# Patient Record
Sex: Female | Born: 1960
Health system: Southern US, Community
[De-identification: ages and names within clinical notes are randomized; demographics above are authoritative.]

## PROBLEM LIST (undated history)

## (undated) DIAGNOSIS — T7840XA Allergy, unspecified, initial encounter: Secondary | ICD-10-CM

## (undated) DIAGNOSIS — M810 Age-related osteoporosis without current pathological fracture: Secondary | ICD-10-CM

## (undated) DIAGNOSIS — Z87891 Personal history of nicotine dependence: Secondary | ICD-10-CM

## (undated) DIAGNOSIS — F1910 Other psychoactive substance abuse, uncomplicated: Secondary | ICD-10-CM

## (undated) DIAGNOSIS — H60399 Other infective otitis externa, unspecified ear: Secondary | ICD-10-CM

## (undated) DIAGNOSIS — J449 Chronic obstructive pulmonary disease, unspecified: Secondary | ICD-10-CM

## (undated) DIAGNOSIS — J302 Other seasonal allergic rhinitis: Secondary | ICD-10-CM

## (undated) DIAGNOSIS — I639 Cerebral infarction, unspecified: Secondary | ICD-10-CM

## (undated) DIAGNOSIS — Z21 Asymptomatic human immunodeficiency virus [HIV] infection status: Secondary | ICD-10-CM

## (undated) DIAGNOSIS — M1990 Unspecified osteoarthritis, unspecified site: Secondary | ICD-10-CM

## (undated) DIAGNOSIS — H269 Unspecified cataract: Secondary | ICD-10-CM

## (undated) DIAGNOSIS — K047 Periapical abscess without sinus: Secondary | ICD-10-CM

## (undated) DIAGNOSIS — J441 Chronic obstructive pulmonary disease with (acute) exacerbation: Secondary | ICD-10-CM

## (undated) DIAGNOSIS — K3189 Other diseases of stomach and duodenum: Secondary | ICD-10-CM

## (undated) DIAGNOSIS — D649 Anemia, unspecified: Secondary | ICD-10-CM

## (undated) DIAGNOSIS — I10 Essential (primary) hypertension: Secondary | ICD-10-CM

## (undated) DIAGNOSIS — K219 Gastro-esophageal reflux disease without esophagitis: Secondary | ICD-10-CM

## (undated) DIAGNOSIS — R21 Rash and other nonspecific skin eruption: Secondary | ICD-10-CM

## (undated) DIAGNOSIS — J40 Bronchitis, not specified as acute or chronic: Secondary | ICD-10-CM

## (undated) DIAGNOSIS — H409 Unspecified glaucoma: Secondary | ICD-10-CM

## (undated) DIAGNOSIS — B20 Human immunodeficiency virus [HIV] disease: Secondary | ICD-10-CM

## (undated) HISTORY — DX: Asymptomatic human immunodeficiency virus (hiv) infection status: Z21

## (undated) HISTORY — DX: Allergy, unspecified, initial encounter: T78.40XA

## (undated) HISTORY — DX: Human immunodeficiency virus (HIV) disease: B20

## (undated) HISTORY — DX: Anemia, unspecified: D64.9

## (undated) HISTORY — DX: Other psychoactive substance abuse, uncomplicated: F19.10

## (undated) HISTORY — DX: Unspecified cataract: H26.9

## (undated) HISTORY — DX: Chronic obstructive pulmonary disease, unspecified: J44.9

## (undated) HISTORY — DX: Bronchitis, not specified as acute or chronic: J40

## (undated) HISTORY — DX: Unspecified glaucoma: H40.9

## (undated) HISTORY — PX: UPPER GI ENDOSCOPY: SHX6162

## (undated) HISTORY — PX: MULTIPLE TOOTH EXTRACTIONS: SHX2053

## (undated) HISTORY — DX: Other seasonal allergic rhinitis: J30.2

## (undated) HISTORY — DX: Gastro-esophageal reflux disease without esophagitis: K21.9

## (undated) HISTORY — PX: TUBAL LIGATION: SHX77

## (undated) HISTORY — DX: Age-related osteoporosis without current pathological fracture: M81.0

## (undated) HISTORY — DX: Personal history of nicotine dependence: Z87.891

---
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
## 1998-04-13 ENCOUNTER — Emergency Department (HOSPITAL_COMMUNITY): Admission: EM | Admit: 1998-04-13 | Discharge: 1998-04-13 | Payer: Self-pay | Admitting: Emergency Medicine

## 1998-06-16 ENCOUNTER — Encounter: Admission: RE | Admit: 1998-06-16 | Discharge: 1998-06-16 | Payer: Self-pay | Admitting: Family Medicine

## 1999-05-10 ENCOUNTER — Emergency Department (HOSPITAL_COMMUNITY): Admission: EM | Admit: 1999-05-10 | Discharge: 1999-05-10 | Payer: Self-pay | Admitting: Emergency Medicine

## 1999-05-10 ENCOUNTER — Encounter: Payer: Self-pay | Admitting: Emergency Medicine

## 1999-05-11 ENCOUNTER — Encounter: Payer: Self-pay | Admitting: Emergency Medicine

## 1999-05-27 ENCOUNTER — Emergency Department (HOSPITAL_COMMUNITY): Admission: EM | Admit: 1999-05-27 | Discharge: 1999-05-27 | Payer: Self-pay | Admitting: Emergency Medicine

## 1999-11-21 ENCOUNTER — Encounter: Admission: RE | Admit: 1999-11-21 | Discharge: 1999-11-21 | Payer: Self-pay | Admitting: Family Medicine

## 1999-12-16 ENCOUNTER — Encounter: Admission: RE | Admit: 1999-12-16 | Discharge: 1999-12-16 | Payer: Self-pay | Admitting: Family Medicine

## 2000-05-01 ENCOUNTER — Emergency Department (HOSPITAL_COMMUNITY): Admission: EM | Admit: 2000-05-01 | Discharge: 2000-05-01 | Payer: Self-pay | Admitting: Emergency Medicine

## 2000-05-25 ENCOUNTER — Emergency Department (HOSPITAL_COMMUNITY): Admission: EM | Admit: 2000-05-25 | Discharge: 2000-05-25 | Payer: Self-pay | Admitting: Emergency Medicine

## 2001-07-24 ENCOUNTER — Emergency Department (HOSPITAL_COMMUNITY): Admission: EM | Admit: 2001-07-24 | Discharge: 2001-07-24 | Payer: Self-pay | Admitting: Emergency Medicine

## 2001-12-18 DIAGNOSIS — I639 Cerebral infarction, unspecified: Secondary | ICD-10-CM

## 2001-12-18 DIAGNOSIS — Z5189 Encounter for other specified aftercare: Secondary | ICD-10-CM

## 2001-12-18 HISTORY — DX: Encounter for other specified aftercare: Z51.89

## 2001-12-18 HISTORY — DX: Cerebral infarction, unspecified: I63.9

## 2003-02-04 ENCOUNTER — Encounter: Admission: RE | Admit: 2003-02-04 | Discharge: 2003-02-04 | Payer: Self-pay | Admitting: Family Medicine

## 2003-02-11 ENCOUNTER — Encounter: Admission: RE | Admit: 2003-02-11 | Discharge: 2003-02-11 | Payer: Self-pay | Admitting: Family Medicine

## 2003-02-25 ENCOUNTER — Ambulatory Visit (HOSPITAL_COMMUNITY): Admission: RE | Admit: 2003-02-25 | Discharge: 2003-02-25 | Payer: Self-pay | Admitting: Sports Medicine

## 2003-02-25 ENCOUNTER — Encounter: Payer: Self-pay | Admitting: Cardiology

## 2003-03-11 ENCOUNTER — Encounter: Admission: RE | Admit: 2003-03-11 | Discharge: 2003-03-11 | Payer: Self-pay | Admitting: Family Medicine

## 2003-03-31 ENCOUNTER — Encounter: Admission: RE | Admit: 2003-03-31 | Discharge: 2003-03-31 | Payer: Self-pay | Admitting: Family Medicine

## 2003-10-12 ENCOUNTER — Emergency Department (HOSPITAL_COMMUNITY): Admission: AD | Admit: 2003-10-12 | Discharge: 2003-10-12 | Payer: Self-pay | Admitting: Family Medicine

## 2006-11-02 ENCOUNTER — Emergency Department (HOSPITAL_COMMUNITY): Admission: EM | Admit: 2006-11-02 | Discharge: 2006-11-02 | Payer: Self-pay | Admitting: Family Medicine

## 2006-11-02 IMAGING — CR DG ABDOMEN 1V
1 series · 1 of 1 positions shown · non-contrast
Comparison: none

CLINICAL DATA: Gastritis, ulcers.   
 ABDOMEN ? 1 VIEW:

[view not recorded]
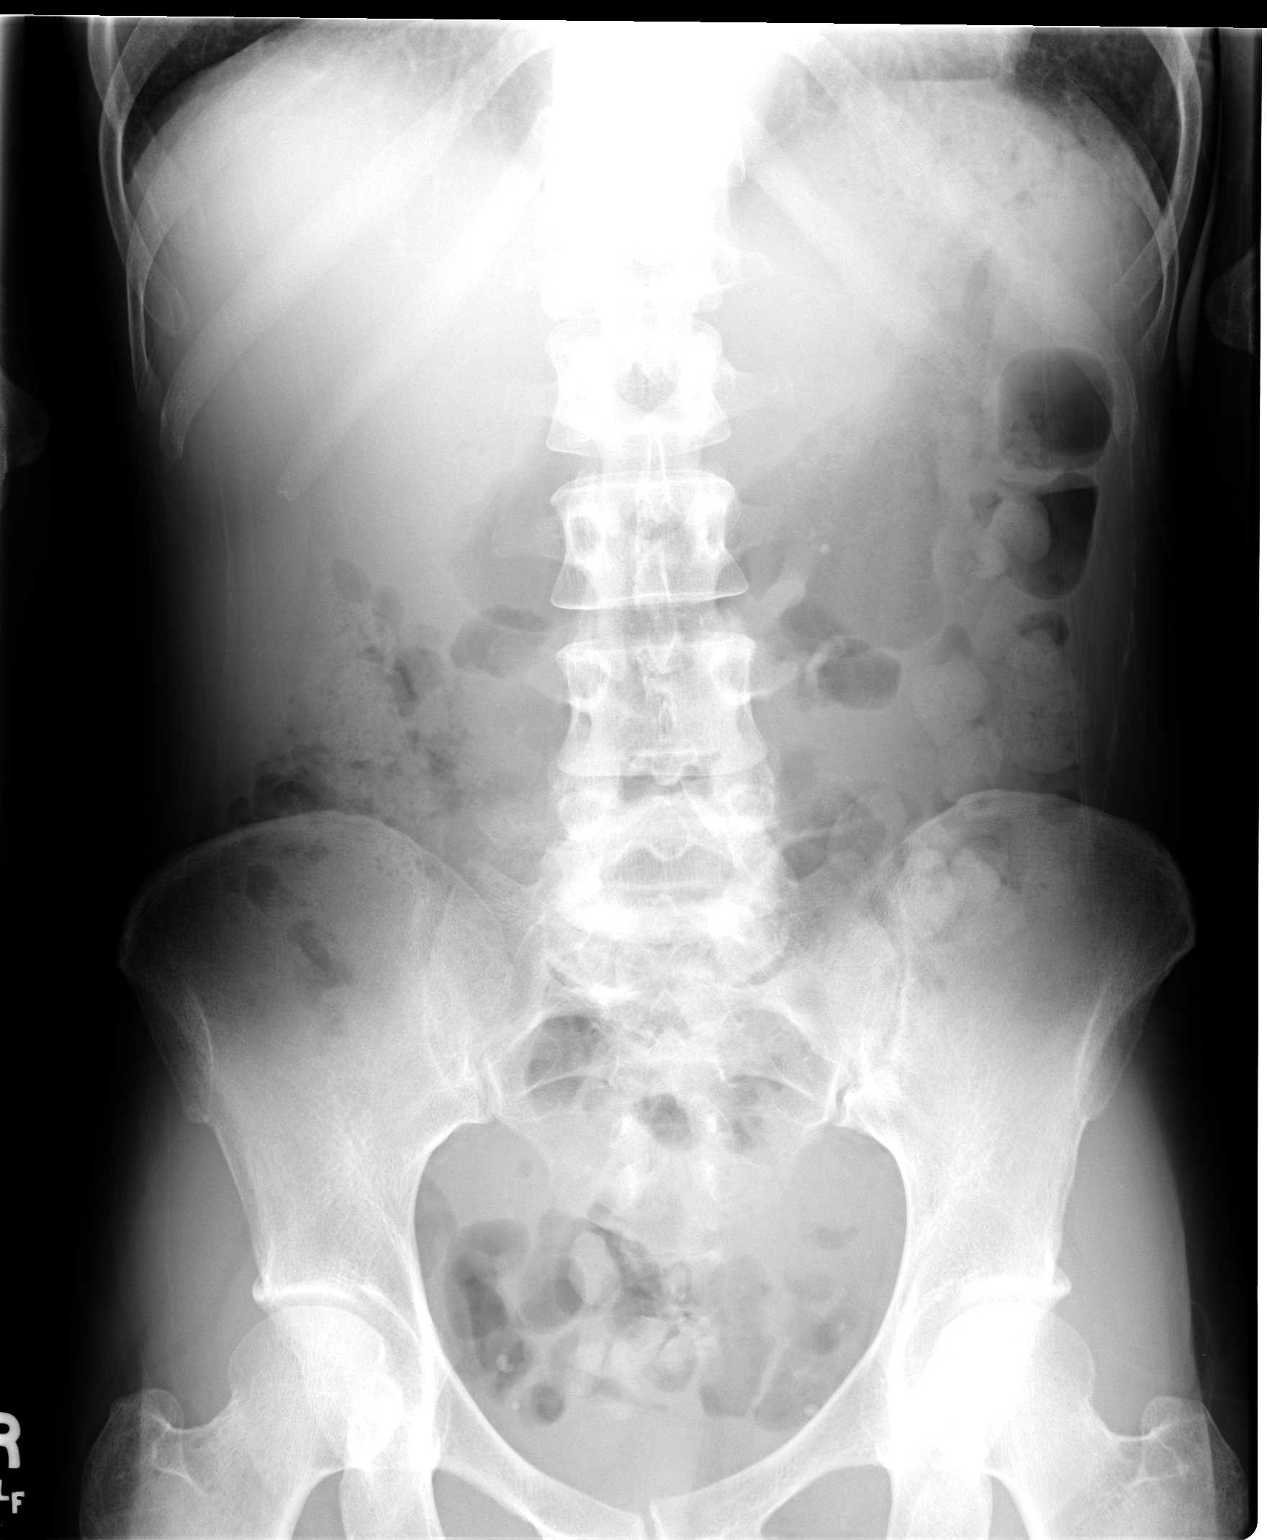

[1 of 1 positions shown; findings below may reference images not displayed]

FINDINGS: The bowel gas pattern is unremarkable.   No free intraperitoneal air is seen on the single view.   No unexpected abdominal calcifications or focal bony abnormality.
IMPRESSION: Negative exam.

## 2007-02-14 DIAGNOSIS — F17200 Nicotine dependence, unspecified, uncomplicated: Secondary | ICD-10-CM | POA: Insufficient documentation

## 2007-02-14 DIAGNOSIS — J309 Allergic rhinitis, unspecified: Secondary | ICD-10-CM | POA: Insufficient documentation

## 2007-02-14 DIAGNOSIS — I6789 Other cerebrovascular disease: Secondary | ICD-10-CM | POA: Insufficient documentation

## 2007-02-14 DIAGNOSIS — L708 Other acne: Secondary | ICD-10-CM | POA: Insufficient documentation

## 2007-02-14 DIAGNOSIS — R1013 Epigastric pain: Secondary | ICD-10-CM | POA: Insufficient documentation

## 2007-02-14 DIAGNOSIS — R638 Other symptoms and signs concerning food and fluid intake: Secondary | ICD-10-CM | POA: Insufficient documentation

## 2007-02-14 DIAGNOSIS — K3189 Other diseases of stomach and duodenum: Secondary | ICD-10-CM

## 2007-02-14 DIAGNOSIS — J310 Chronic rhinitis: Secondary | ICD-10-CM | POA: Insufficient documentation

## 2007-02-14 HISTORY — DX: Epigastric pain: R10.13

## 2007-02-14 HISTORY — DX: Other diseases of stomach and duodenum: K31.89

## 2007-10-16 ENCOUNTER — Emergency Department (HOSPITAL_COMMUNITY): Admission: EM | Admit: 2007-10-16 | Discharge: 2007-10-16 | Payer: Self-pay | Admitting: Emergency Medicine

## 2007-10-16 IMAGING — CR DG CHEST 2V
2 series · 2 of 2 positions shown · non-contrast
Comparison: none

CLINICAL DATA: Productive cough.  
 CHEST ? 2 VIEW:

[view not recorded (1 of 2)]
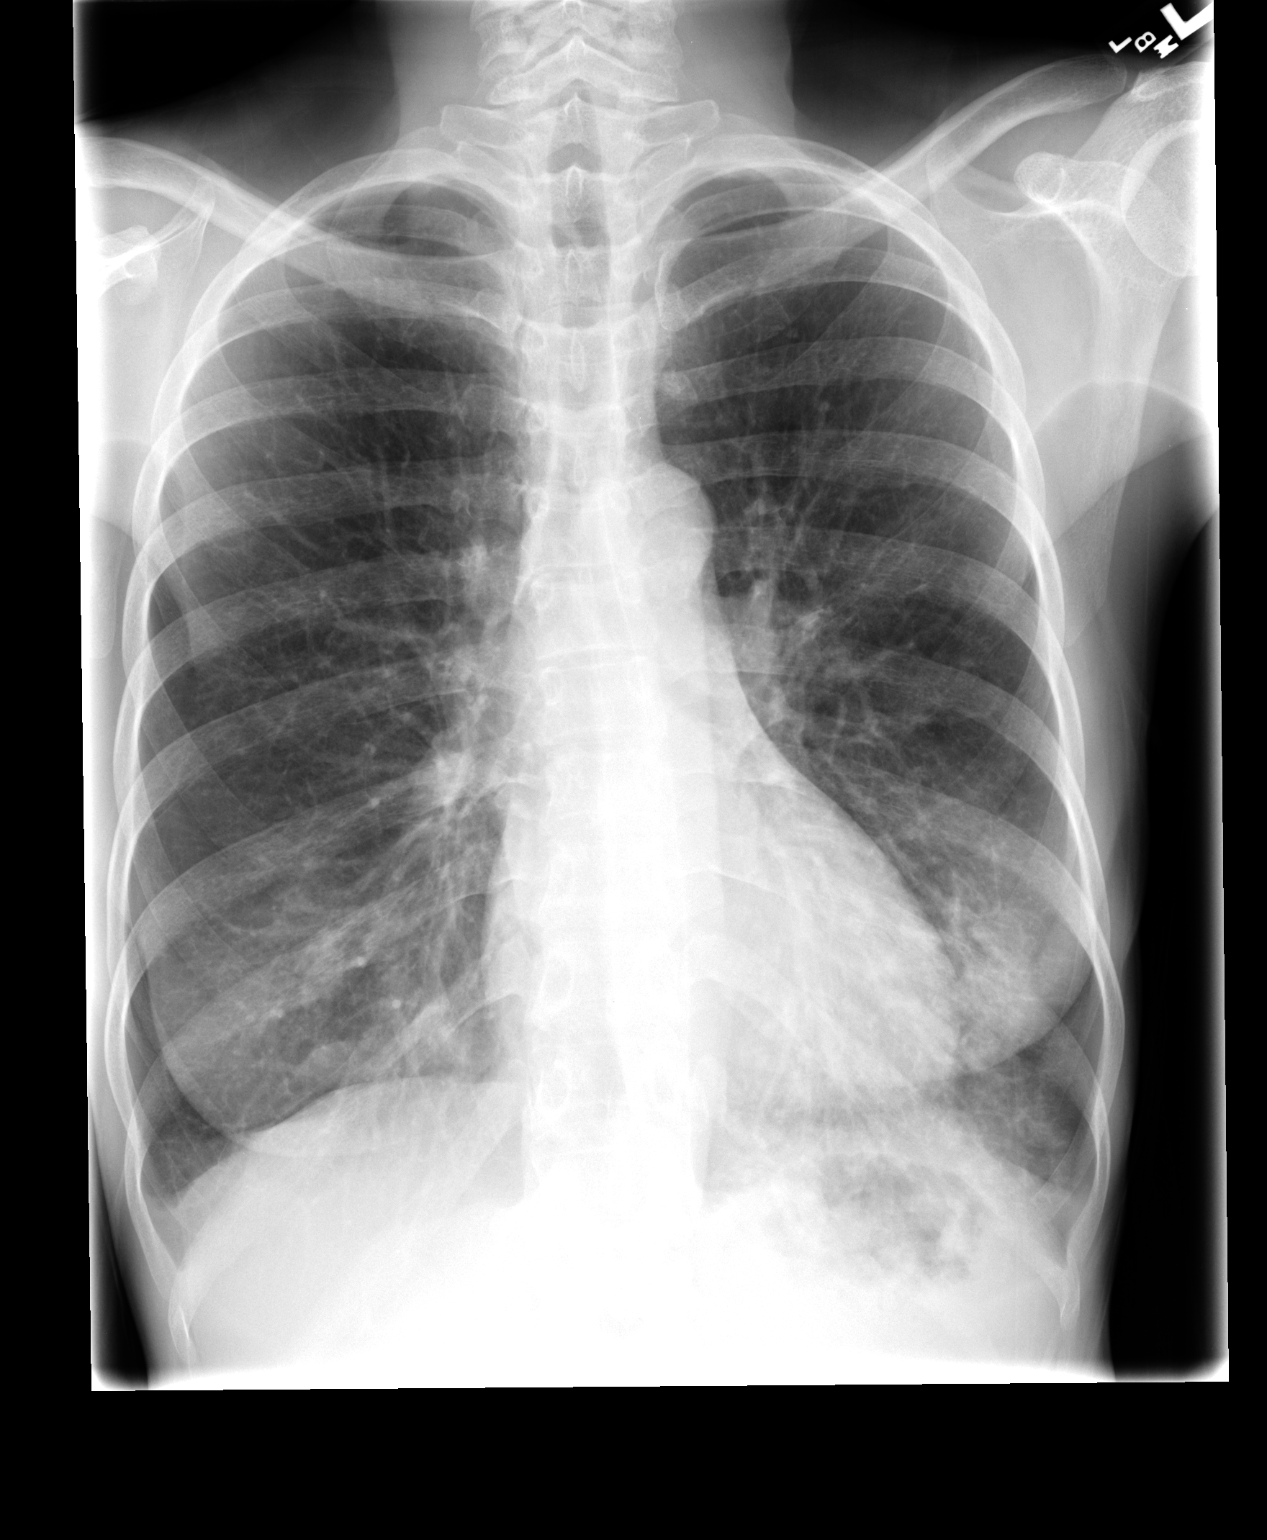

[view not recorded (2 of 2)]
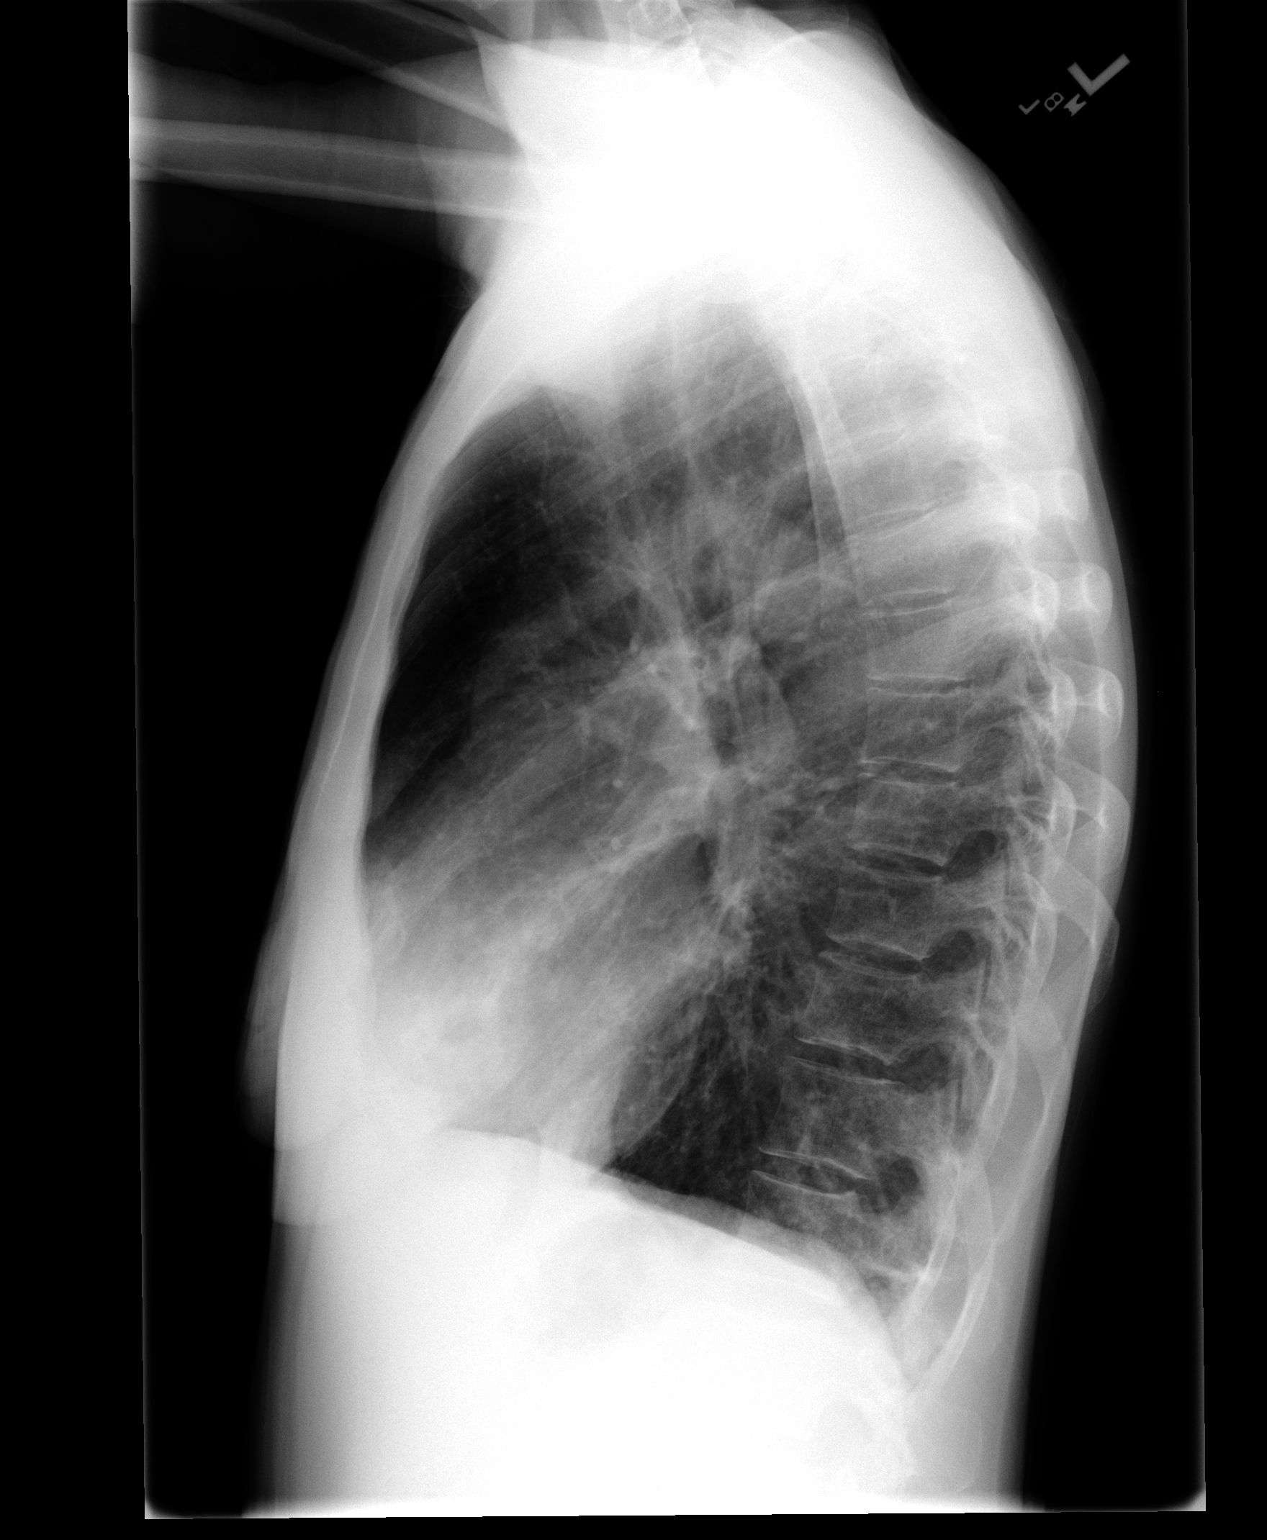

[2 of 2 positions shown; findings below may reference images not displayed]

FINDINGS: Two views of the chest show parenchymal opacity within the lingula and possibly left lower lobe consistent with pneumonia.  The remainder of the lungs are clear and somewhat hyperaerated.  The heart is within normal limits in size.
IMPRESSION: Lingular and possible left lower lobe opacity consistent with pneumonia.

## 2007-10-21 ENCOUNTER — Emergency Department (HOSPITAL_COMMUNITY): Admission: EM | Admit: 2007-10-21 | Discharge: 2007-10-21 | Payer: Self-pay | Admitting: Emergency Medicine

## 2007-12-04 ENCOUNTER — Emergency Department (HOSPITAL_COMMUNITY): Admission: EM | Admit: 2007-12-04 | Discharge: 2007-12-04 | Payer: Self-pay | Admitting: Emergency Medicine

## 2007-12-31 ENCOUNTER — Emergency Department (HOSPITAL_COMMUNITY): Admission: EM | Admit: 2007-12-31 | Discharge: 2007-12-31 | Payer: Self-pay | Admitting: Family Medicine

## 2008-01-19 HISTORY — PX: POLYPECTOMY: SHX149

## 2008-01-30 ENCOUNTER — Emergency Department (HOSPITAL_COMMUNITY): Admission: EM | Admit: 2008-01-30 | Discharge: 2008-01-30 | Payer: Self-pay | Admitting: Family Medicine

## 2008-02-14 ENCOUNTER — Emergency Department (HOSPITAL_COMMUNITY): Admission: EM | Admit: 2008-02-14 | Discharge: 2008-02-14 | Payer: Self-pay | Admitting: Family Medicine

## 2008-02-23 ENCOUNTER — Emergency Department (HOSPITAL_COMMUNITY): Admission: EM | Admit: 2008-02-23 | Discharge: 2008-02-23 | Payer: Self-pay | Admitting: Family Medicine

## 2008-02-23 IMAGING — CT CT HEAD W/O CM
5 of 6 series · 18 of 47 positions shown, 19 images · IV contrast (agent unspecified)
Comparison: none

CLINICAL DATA: Dizziness and lightheadedness.  Hypertension. Severe pain radiating to left arm.
 HEAD CT WITHOUT CONTRAST:
TECHNIQUE: Contiguous axial images were obtained from the base of the skull through the vertex according to standard protocol without contrast.
TECHNIQUE: Multidetector CT imaging of the cervical spine was performed.  Multiplanar CT image reconstructions were also generated.

[Series 2: brain · axial · 0.47mm/px · z∈[+177,+227]mm · 2 of 32 slices shown, 3 images]
[im 11/32  brain]
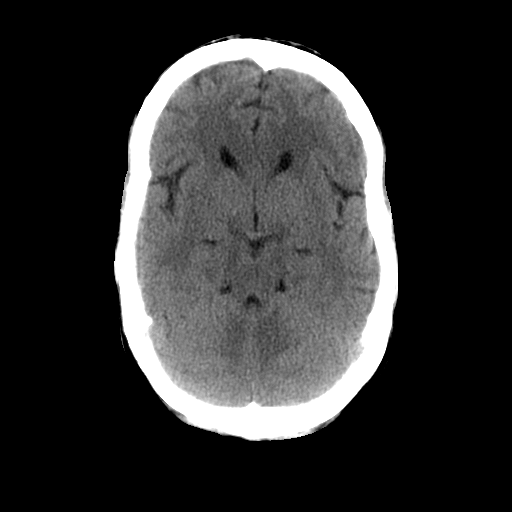
[im 11/32  bone]
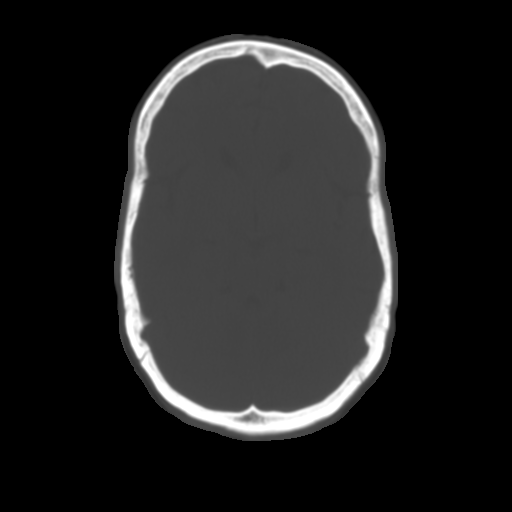
[im 21/32  brain]
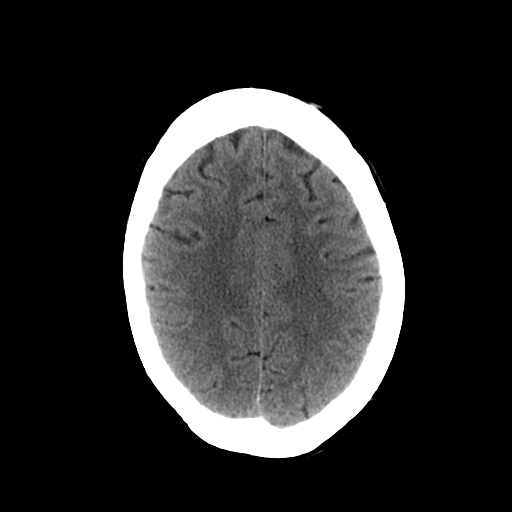

[Series 4: cervical spine · axial · 0.27mm/px · z∈[-19,+8]mm · 2 of 79 slices shown]
[im 12/79  brain]
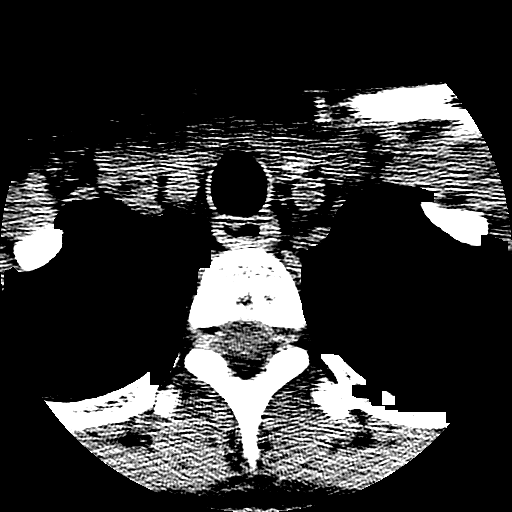
[im 23/79  brain]
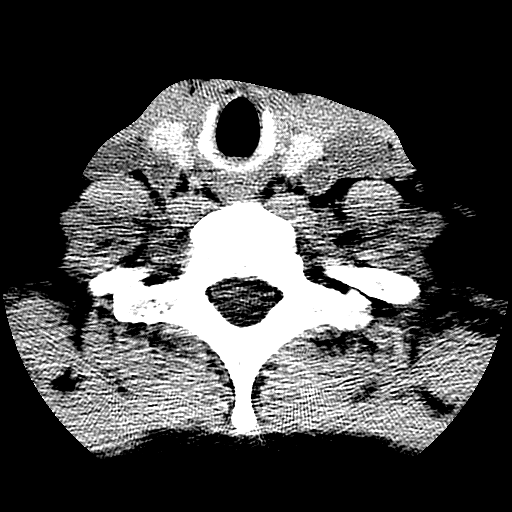

[Series 600: (person_name) · coronal · 0.39mm/px · 3 of 34 slices shown]
[im 12/34  brain]
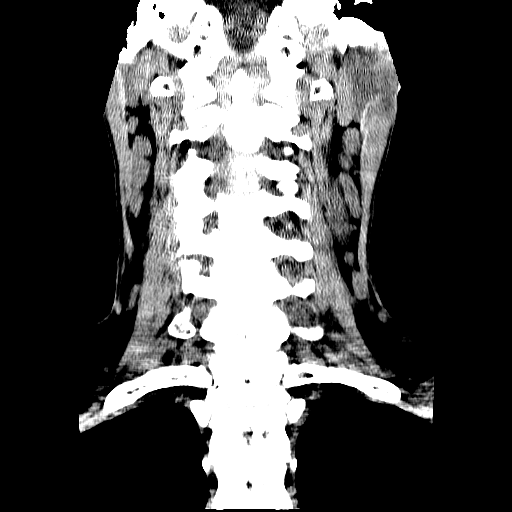
[im 15/34  brain]
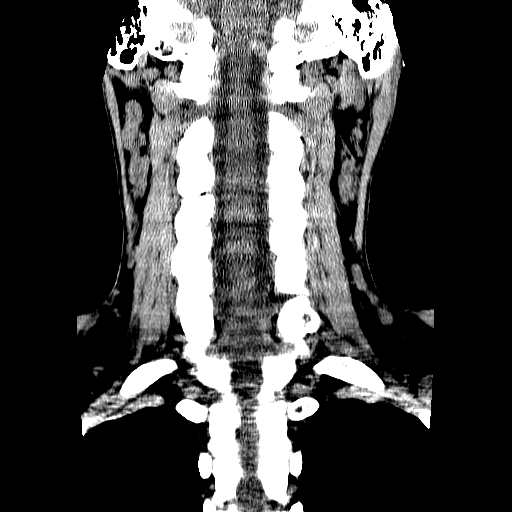
[im 19/34  brain]
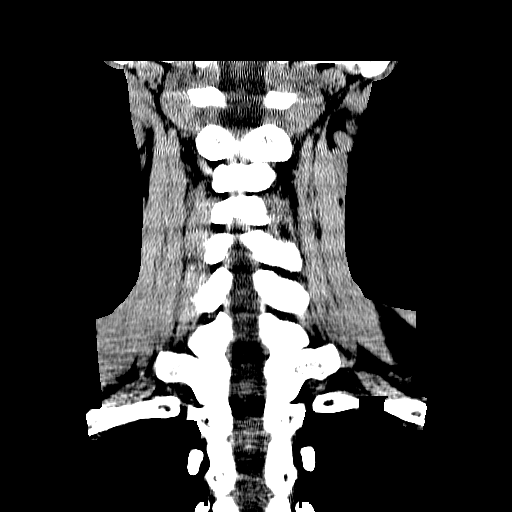

[Series 601: axial c-spine · axial · 0.39mm/px · z∈[-64,+68]mm · 8 of 95 slices shown]
[im 11/95  brain]
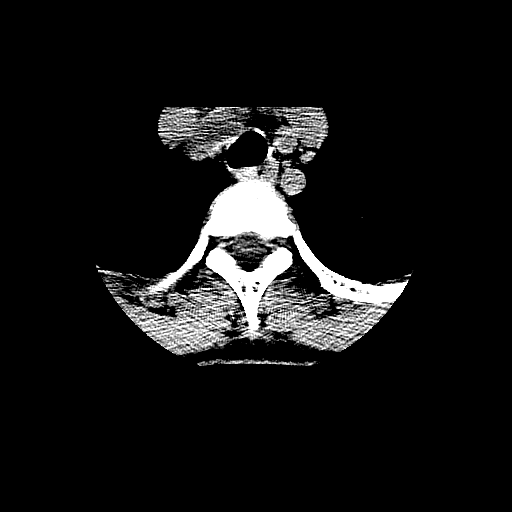
[im 21/95  brain]
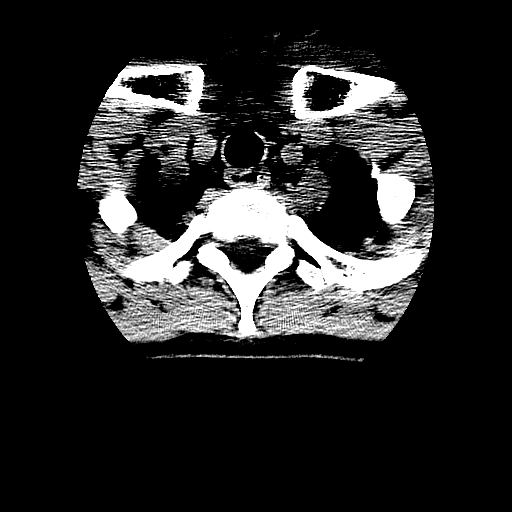
[im 32/95  brain]
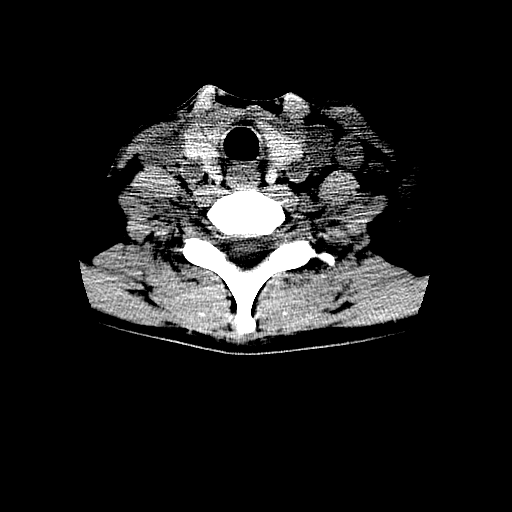
[im 42/95  brain]
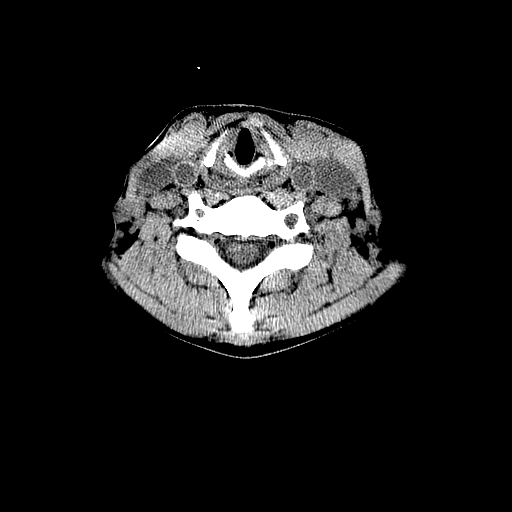
[im 53/95  brain]
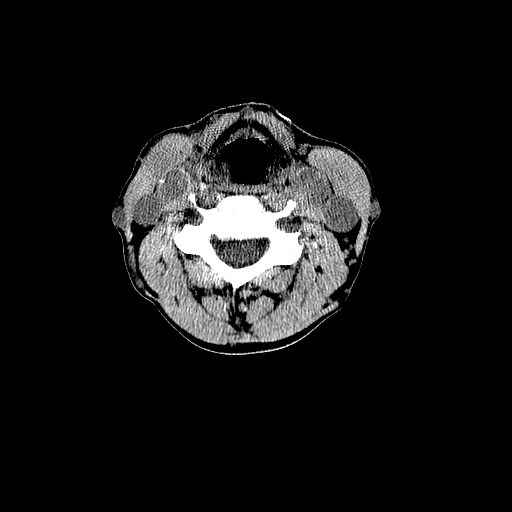
[im 63/95  brain]
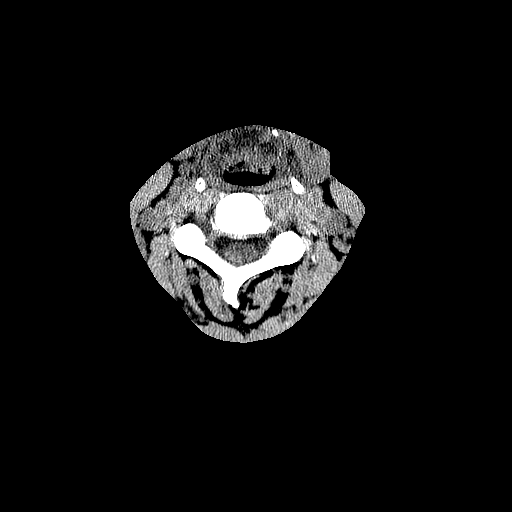
[im 74/95  brain]
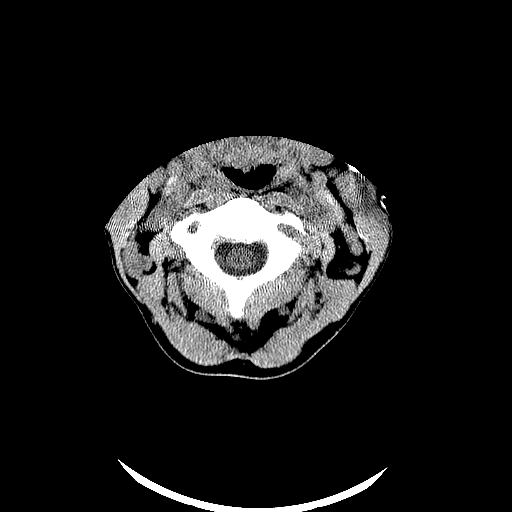
[im 84/95  brain]
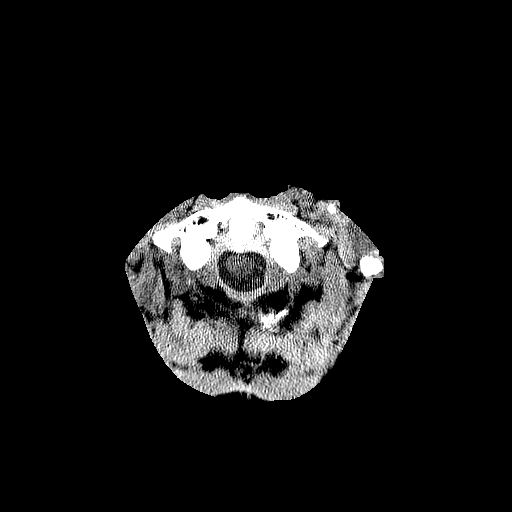

[Series 602: sag c-spine · sagittal · 0.39mm/px · 3 of 38 slices shown]
[im 13/38  brain]
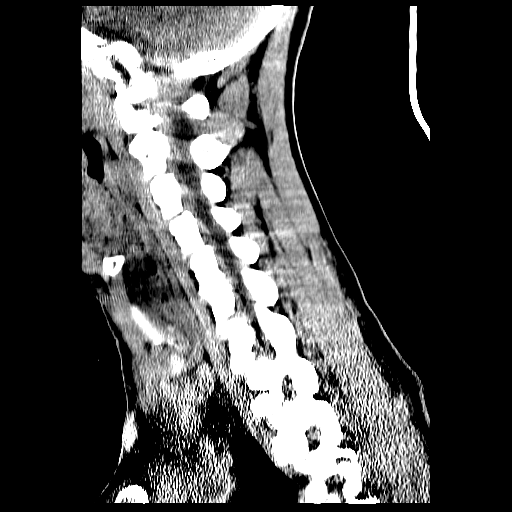
[im 19/38  brain]
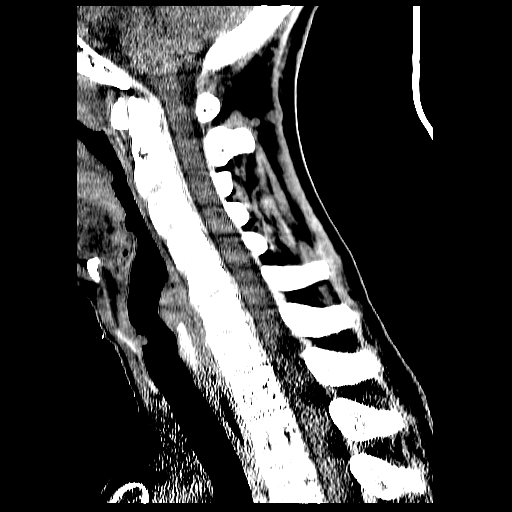
[im 25/38  brain]
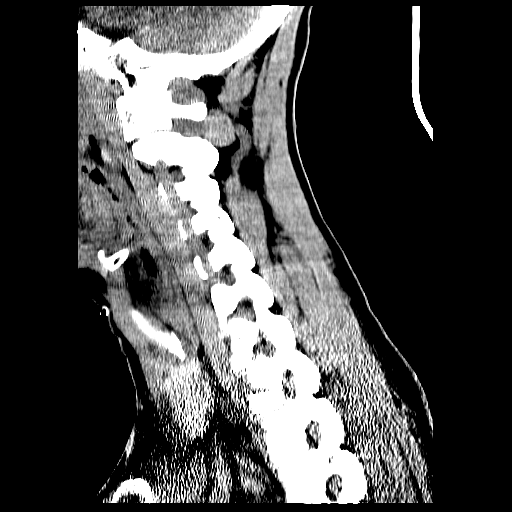

[18 of 47 positions shown; findings below may reference images not displayed]

FINDINGS: There is no evidence of intracranial hemorrhage, brain edema, acute infarct, mass lesion, or mass effect.  No other intra-axial abnormalities are seen, and the ventricles are within normal limits.  No abnormal extra-axial fluid collections or masses are identified.  No skull abnormalities are noted.
IMPRESSION: Negative non-contrast head CT.
 CERVICAL SPINE CT WITHOUT CONTRAST:
FINDINGS: There is no evidence for cervical spine fracture or subluxation.  Mild degenerative disk disease is seen at C5-6.  There is no evidence of facet arthropathy or other significant bone abnormality.  There is no evidence of foraminal stenosis or spinal canal stenosis.
IMPRESSION: 1.  No acute findings.
 2.  Mild degenerative disk disease at C5-6.

## 2008-02-29 ENCOUNTER — Emergency Department (HOSPITAL_COMMUNITY): Admission: EM | Admit: 2008-02-29 | Discharge: 2008-02-29 | Payer: Self-pay | Admitting: Emergency Medicine

## 2008-02-29 IMAGING — CR DG ABDOMEN ACUTE W/ 1V CHEST
3 series · 3 of 3 positions shown · non-contrast
Comparison: none

CLINICAL DATA: Chest and back pain

[w chest pa]
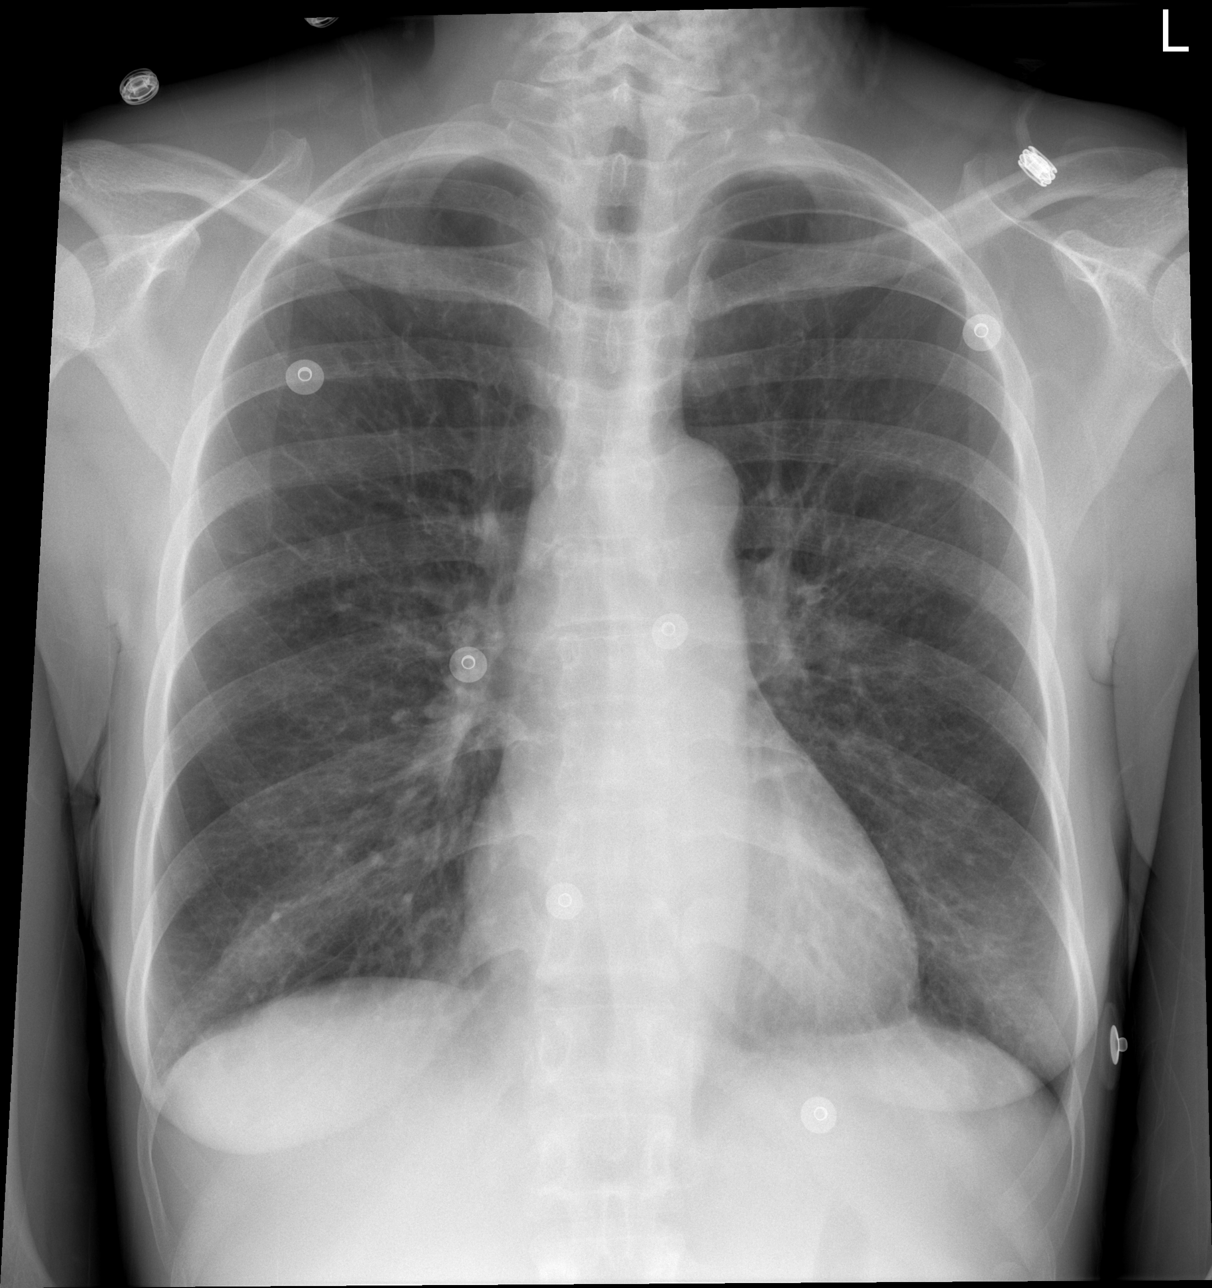

[w abdomen upright]
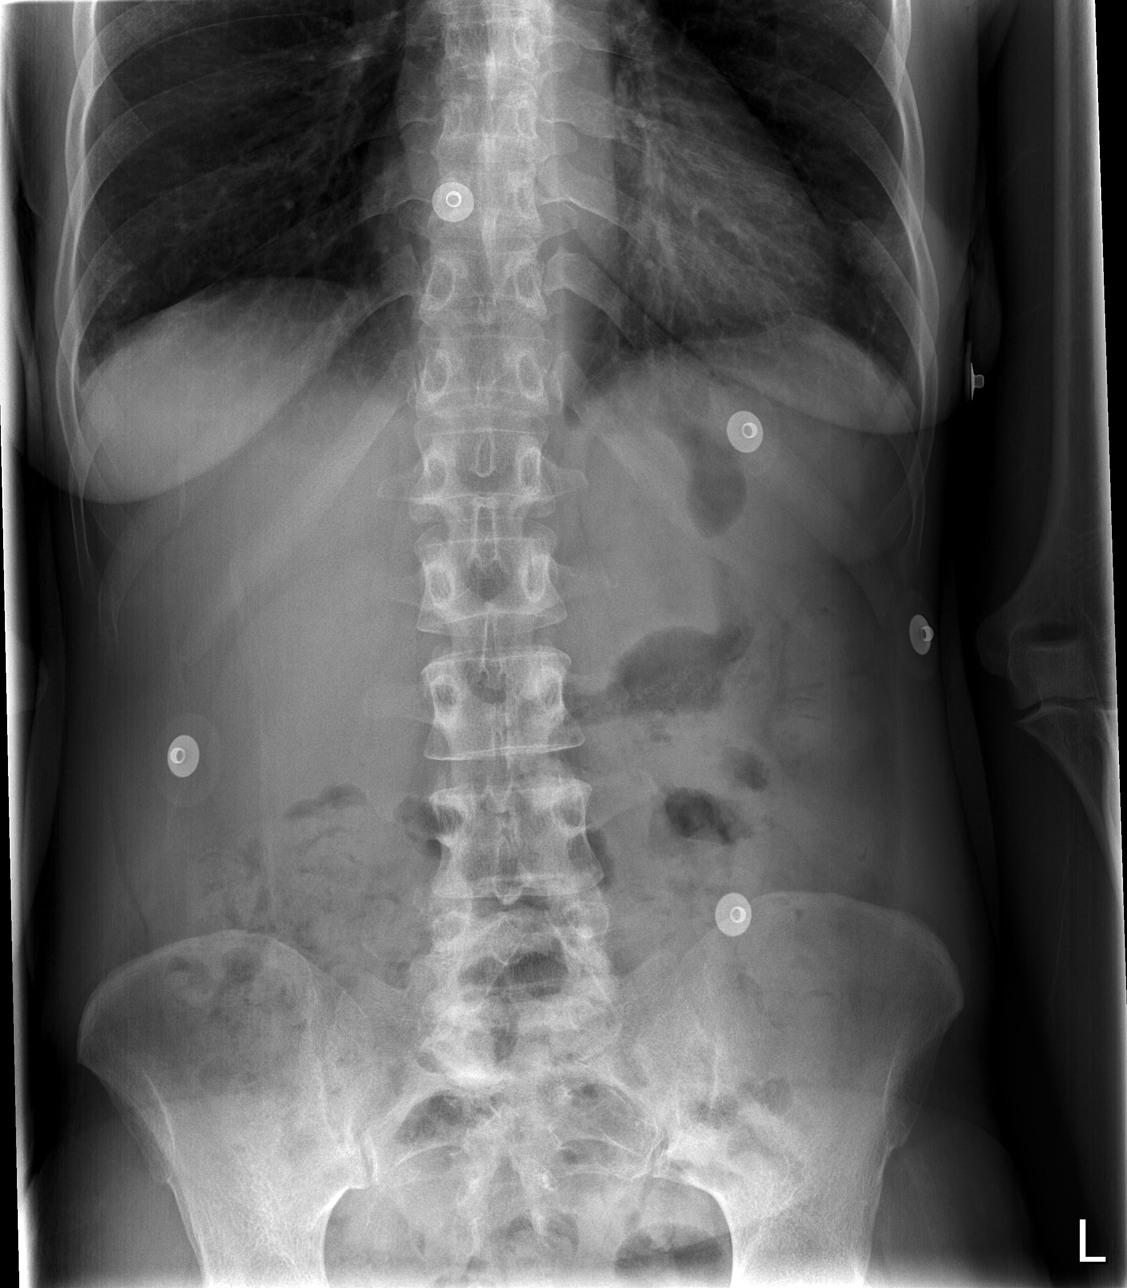

[t abdomen supine]
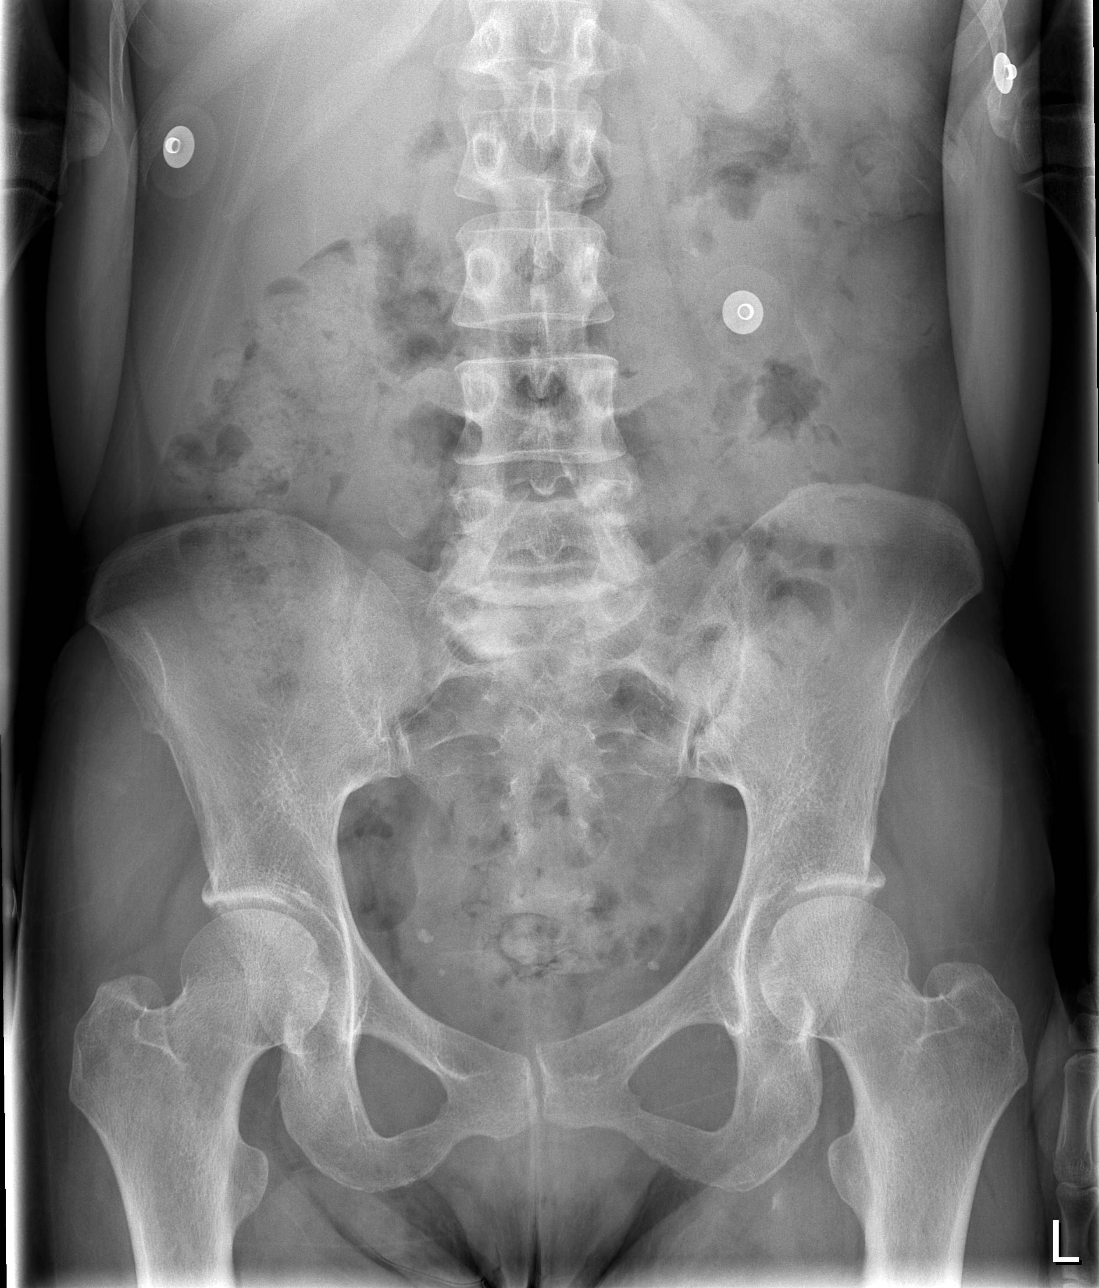

[3 of 3 positions shown; findings below may reference images not displayed]

Acute abdomen with chest:

Frontal chest film is compared to previous from [DATE]. Somewhat coarse
bibasilar interstitial opacities with no confluent infiltrate. Heart size is
normal. No effusion.

Supine and erect abdomen films show no free air. Small bowel decompressed. Fecal
material throughout nondilated colon. Stable arterial and pelvic vascular
calcifications noted.
IMPRESSION: 1. Unremarkable bowel gas pattern.
2. No acute cardiopulmonary disease

## 2008-03-05 ENCOUNTER — Emergency Department (HOSPITAL_COMMUNITY): Admission: EM | Admit: 2008-03-05 | Discharge: 2008-03-05 | Payer: Self-pay | Admitting: Emergency Medicine

## 2008-03-12 ENCOUNTER — Emergency Department (HOSPITAL_COMMUNITY): Admission: EM | Admit: 2008-03-12 | Discharge: 2008-03-12 | Payer: Self-pay | Admitting: Emergency Medicine

## 2008-03-12 IMAGING — CR DG CHEST 2V
2 series · 2 of 2 positions shown · non-contrast
Comparison: [DATE].

CLINICAL DATA: Chest pain/cough/dehydration.
 CHEST - 2 VIEW:

[w chest pa]
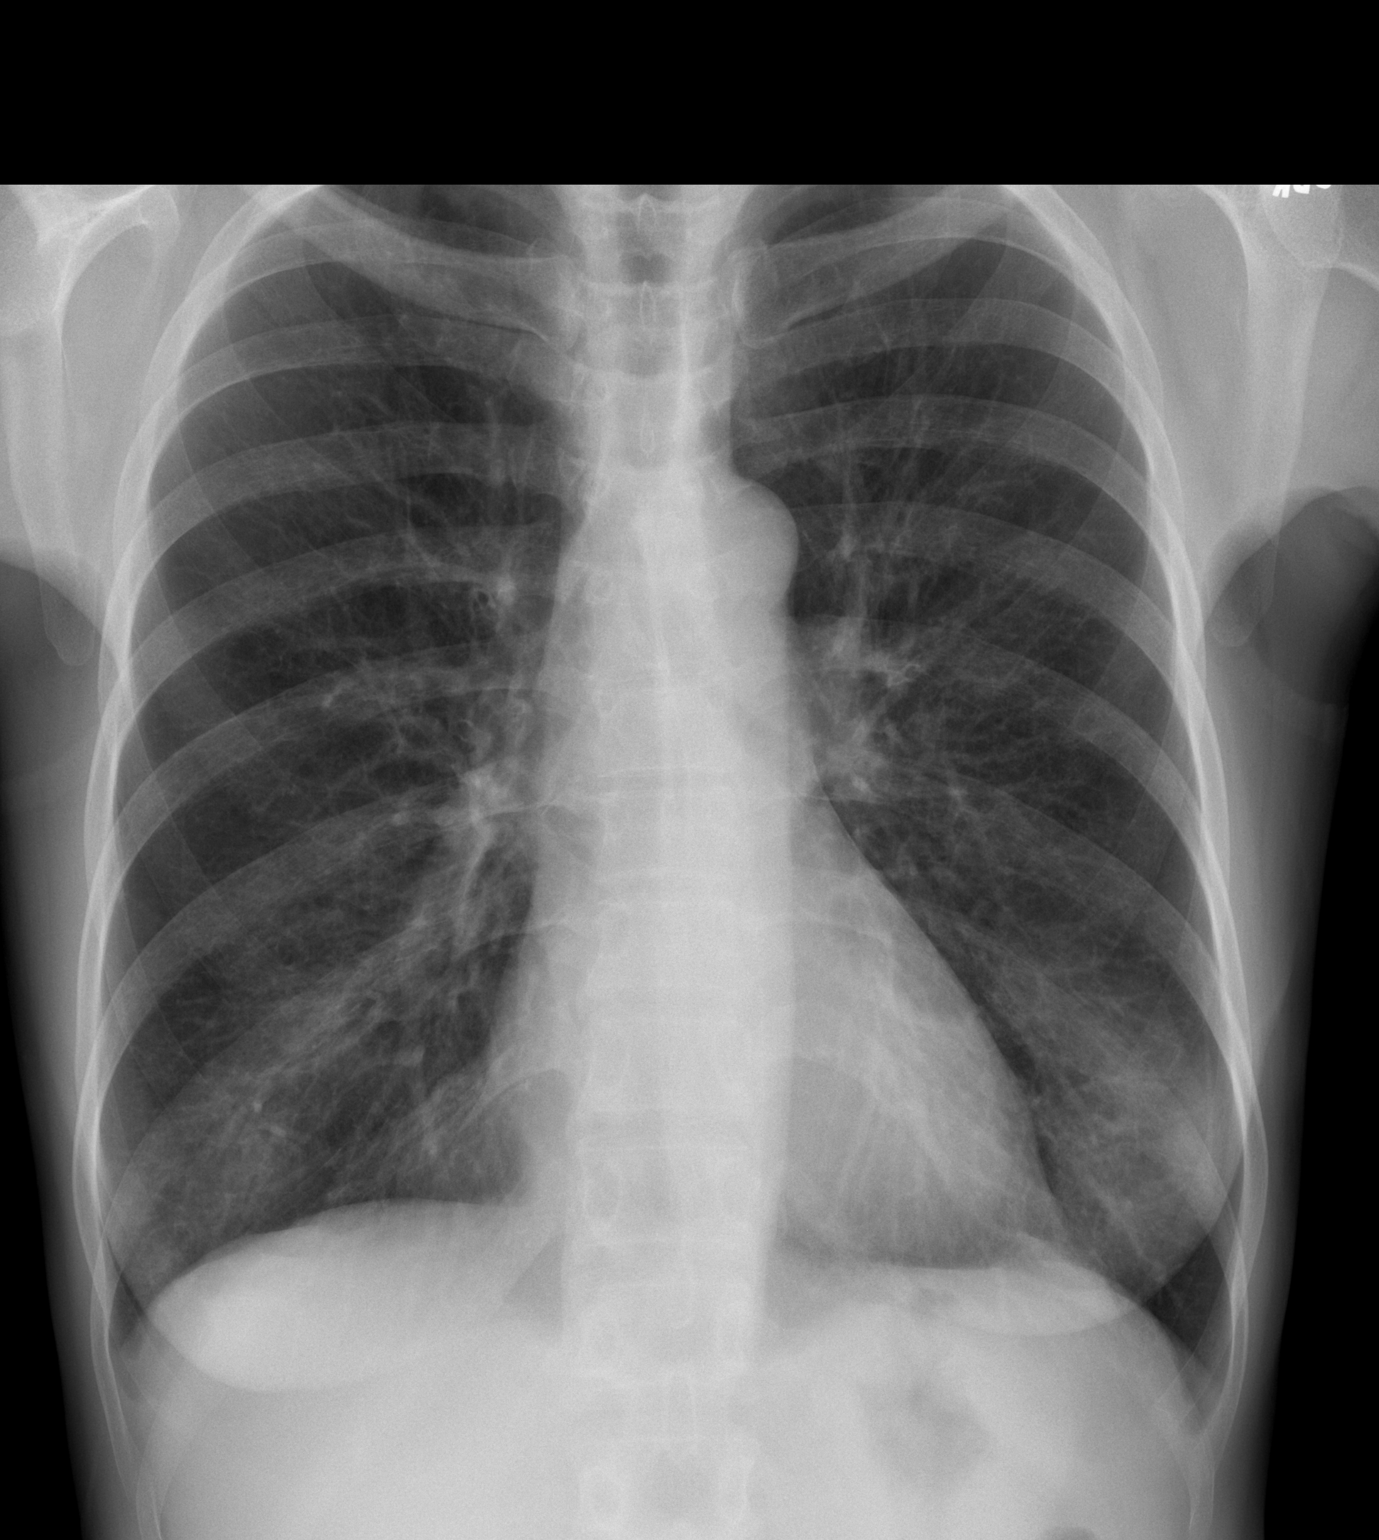

[w chest lat]
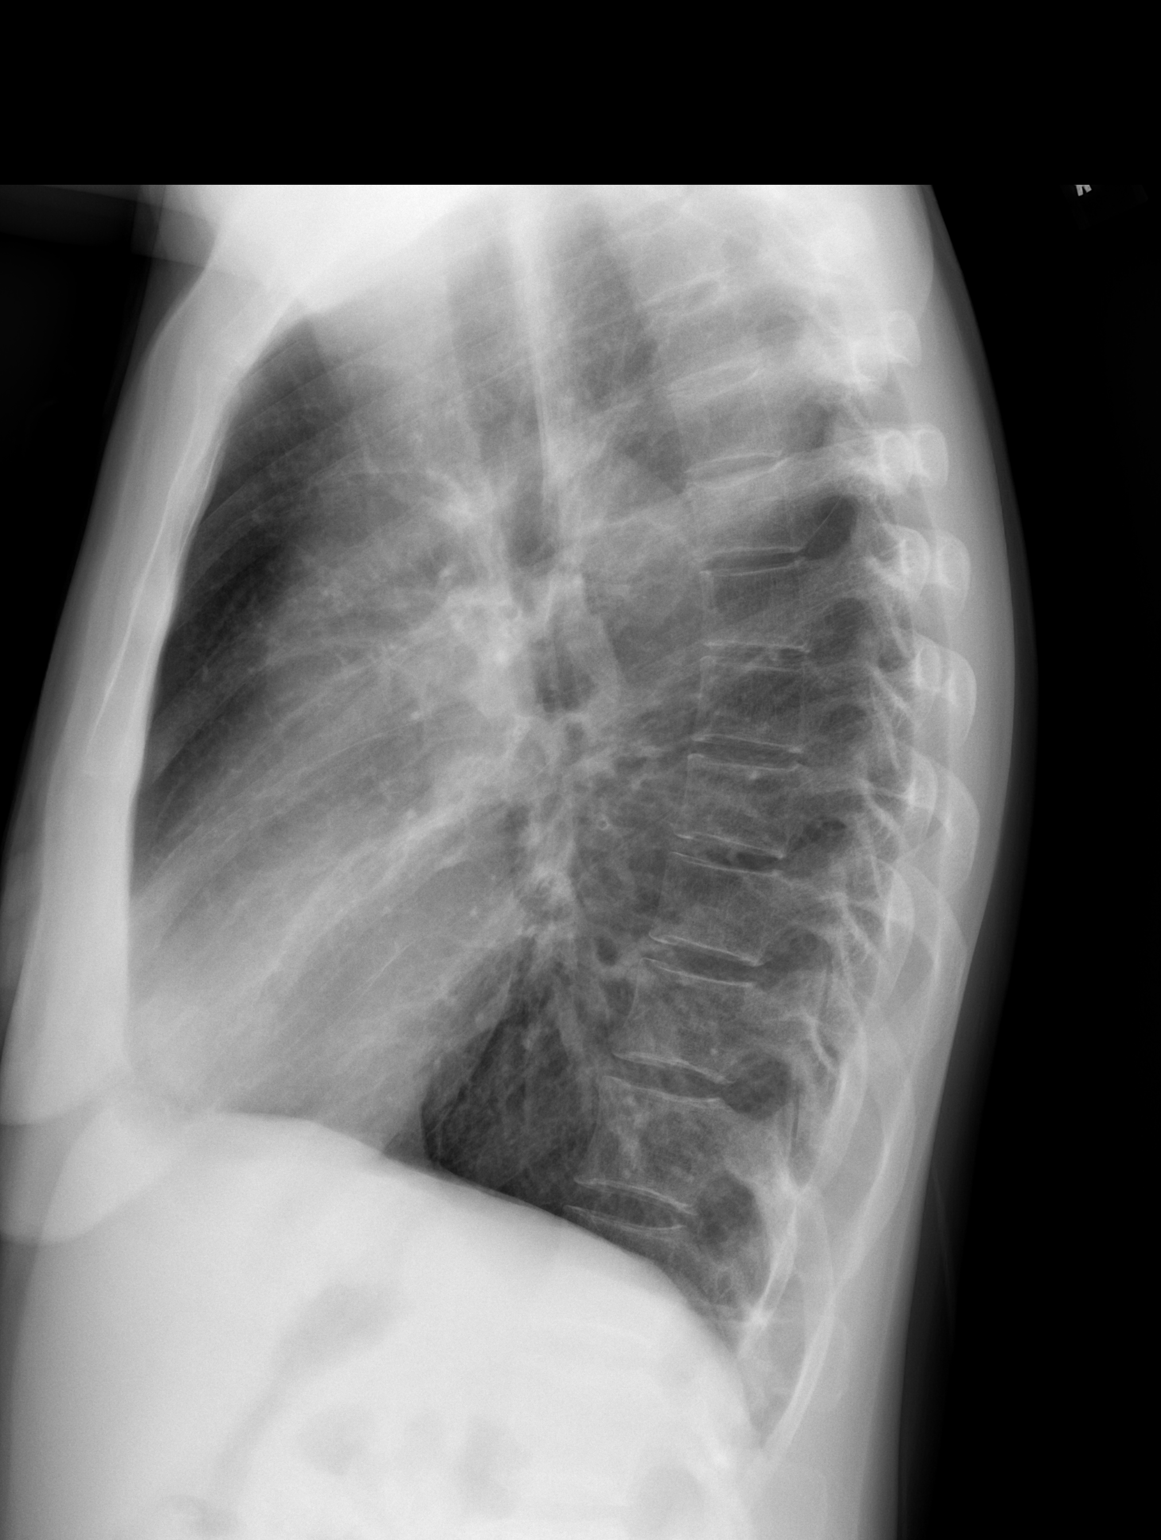

[2 of 2 positions shown; findings below may reference images not displayed]

FINDINGS: The heart size is within normal limits.  There is peribronchial thickening and some accentuation of basilar markings but lingular and left lower lobe air space densities appear to have completely resolved.  No pleural fluid or osseous lesions.
IMPRESSION: Peribronchial thickening and chronic lung changes ? no acute findings.

## 2008-03-22 ENCOUNTER — Emergency Department (HOSPITAL_COMMUNITY): Admission: EM | Admit: 2008-03-22 | Discharge: 2008-03-22 | Payer: Self-pay | Admitting: Emergency Medicine

## 2008-03-22 IMAGING — CR DG CHEST 2V
2 series · 2 of 2 positions shown · non-contrast
Comparison: [DATE], [DATE]

CLINICAL DATA: Chest and back pain, cough, COPD

CHEST - 2 VIEW

[w chest pa]
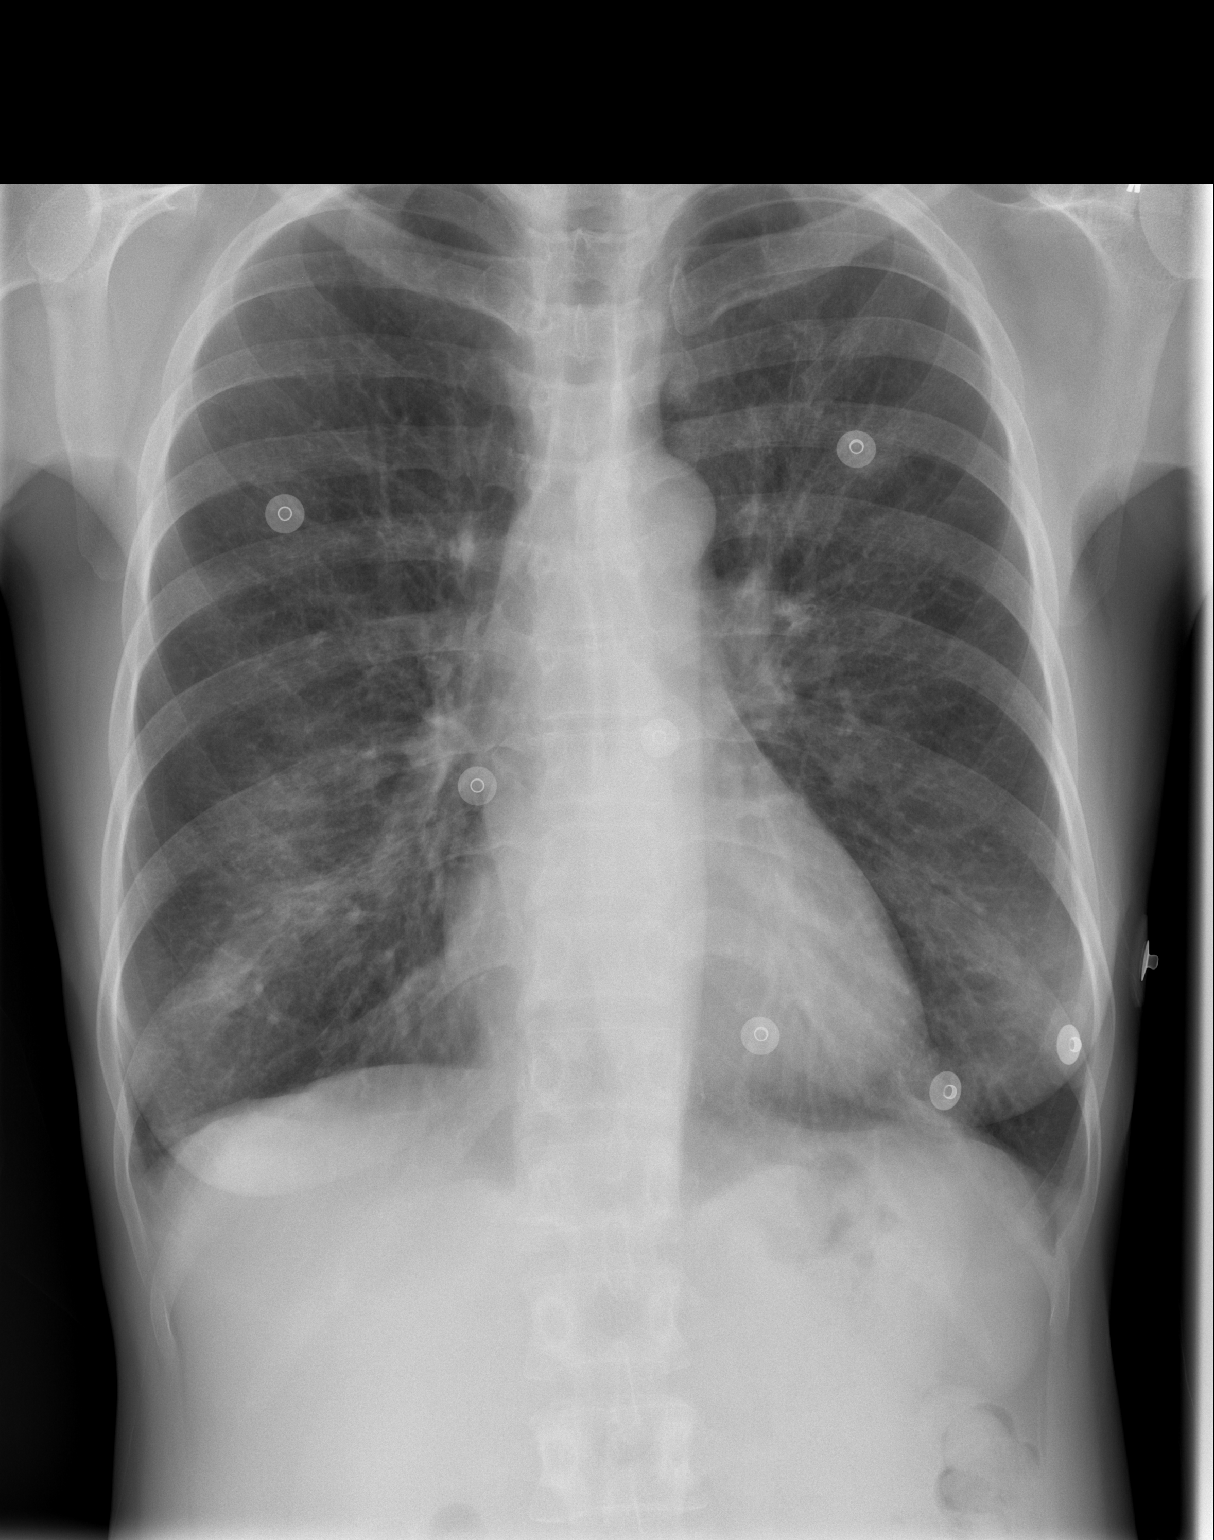

[w chest lat]
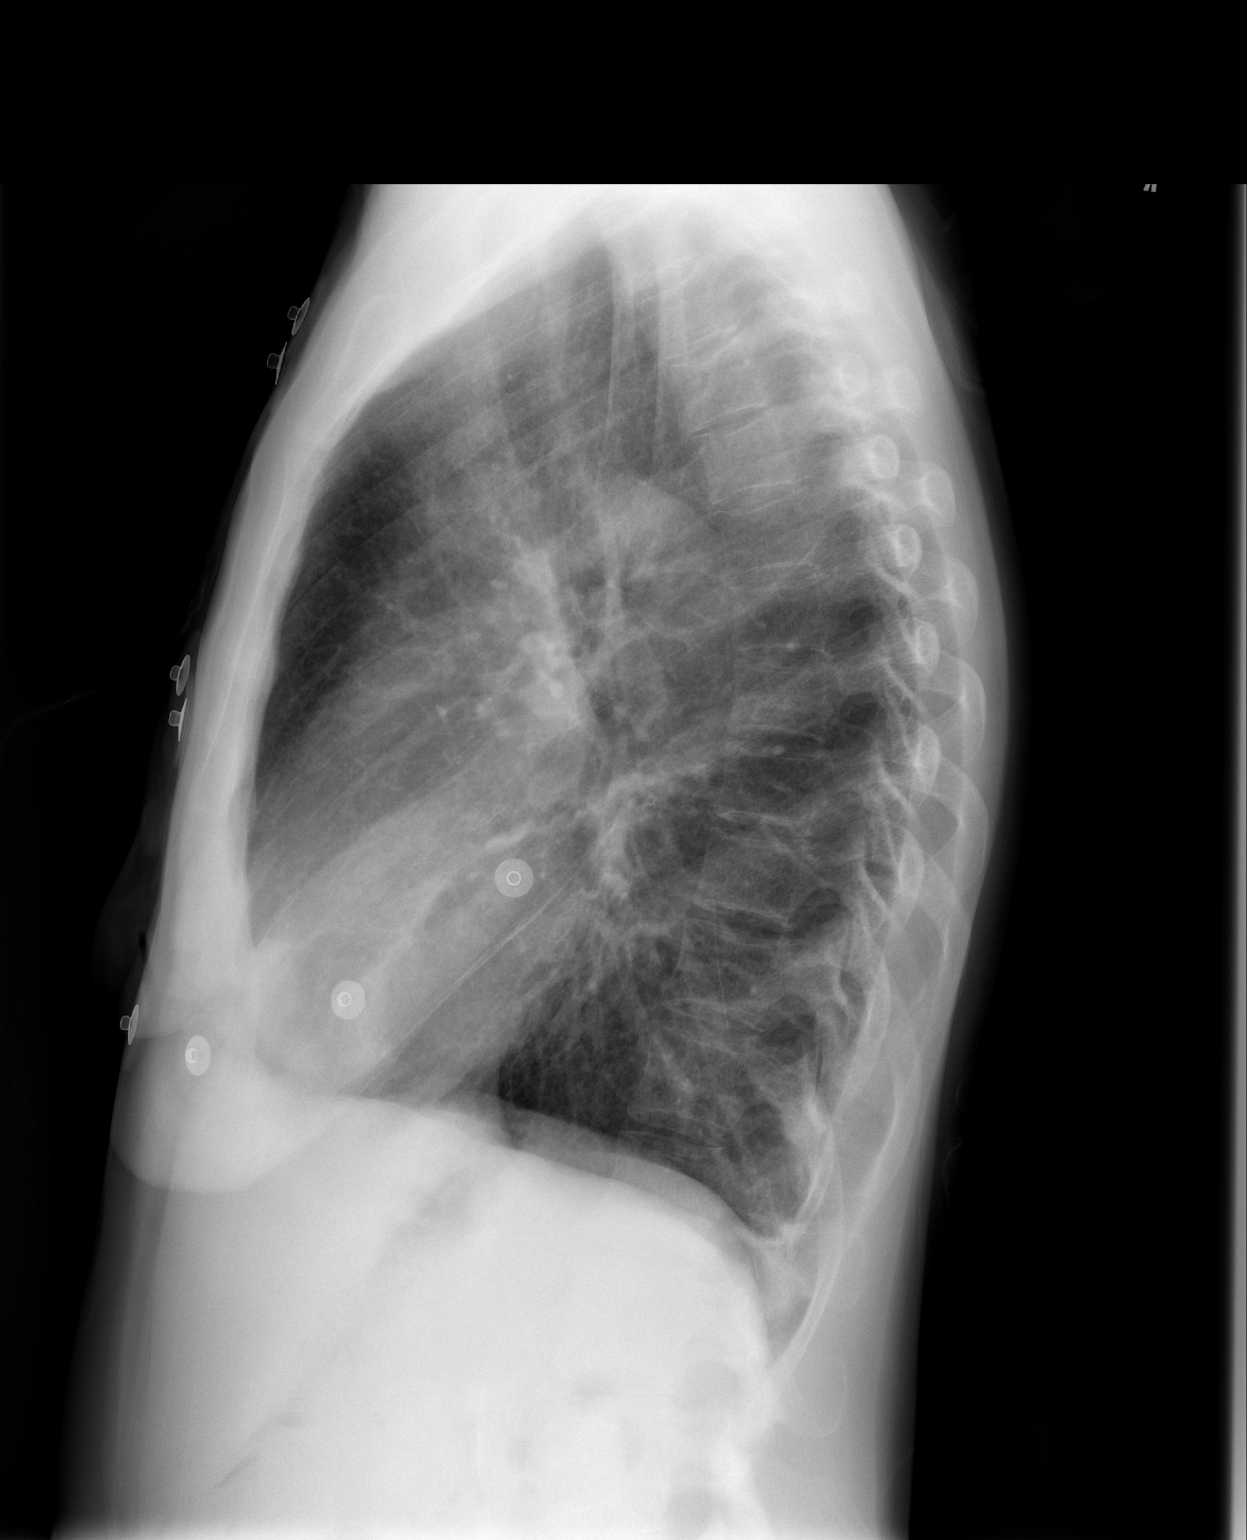

[2 of 2 positions shown; findings below may reference images not displayed]

FINDINGS: Chronic hyperinflation and central bronchial thickening.
Basilar interstitial opacities are again noted.  No definite
superimposed pneumonia, edema, effusion or pneumothorax.  Lower
chest nodular densities are likely nipple shadows.  This could be
confirmed with a repeat exam including markers.  Trachea is
midline.  Normal heart size.  No pneumothorax.
IMPRESSION: Chronic COPD, bronchial thickening and basilar interstitial
changes.  No superimposed acute finding.

## 2008-04-03 ENCOUNTER — Encounter (INDEPENDENT_AMBULATORY_CARE_PROVIDER_SITE_OTHER): Payer: Self-pay | Admitting: Nurse Practitioner

## 2008-04-03 ENCOUNTER — Ambulatory Visit: Payer: Self-pay | Admitting: Internal Medicine

## 2008-04-03 LAB — CONVERTED CEMR LAB
ALT: <8 U/L
AST: 15 U/L
Albumin: 3.6 g/dL
Alkaline Phosphatase: 52 U/L
BUN: 25 mg/dL — ABNORMAL HIGH
Basophils Absolute: 0 10*3/uL
Basophils Relative: 1 %
CO2: 24 meq/L
Calcium: 9.4 mg/dL
Chloride: 106 meq/L
Creatinine, Ser: 1 mg/dL
Eosinophils Absolute: 0.3 10*3/uL
Eosinophils Relative: 5 %
Glucose, Bld: 93 mg/dL
HCT: 30.8 % — ABNORMAL LOW
HIV-1 antibody: POSITIVE — AB
HIV-2 Ab: NEGATIVE
HIV: REACTIVE
Helicobacter Pylori Antibody-IgG: 4.2 — ABNORMAL HIGH
Hemoglobin: 10 g/dL — ABNORMAL LOW
Lymphocytes Relative: 30 %
Lymphs Abs: 1.7 10*3/uL
MCHC: 32.5 g/dL
MCV: 94.5 fL
Monocytes Absolute: 0.7 10*3/uL
Monocytes Relative: 12 %
Neutro Abs: 3 10*3/uL
Neutrophils Relative %: 53 %
Platelets: 458 10*3/uL — ABNORMAL HIGH
Potassium: 4.7 meq/L
RBC: 3.26 M/uL — ABNORMAL LOW
RDW: 14.5 %
Sodium: 137 meq/L
TSH: 1.083 u[IU]/mL
Total Bilirubin: 0.2 mg/dL — ABNORMAL LOW
Total Protein: 8.6 g/dL — ABNORMAL HIGH
WBC: 5.6 10*3/uL

## 2008-04-14 ENCOUNTER — Ambulatory Visit: Payer: Self-pay | Admitting: Internal Medicine

## 2008-04-16 ENCOUNTER — Ambulatory Visit: Payer: Self-pay | Admitting: Internal Medicine

## 2008-04-16 ENCOUNTER — Encounter: Payer: Self-pay | Admitting: Internal Medicine

## 2008-04-16 LAB — CONVERTED CEMR LAB
Absolute CD4: 165 #/uL — ABNORMAL LOW (ref 381–1469)
CD4 T Helper %: 10 % — ABNORMAL LOW (ref 32–62)
Chlamydia, Swab/Urine, PCR: NEGATIVE
GC Probe Amp, Urine: NEGATIVE
HCV Ab: NEGATIVE
HIV 1 RNA Quant: 142000 copies/mL — ABNORMAL HIGH (ref <50)
HIV-1 RNA Quant, Log: 5.15 — ABNORMAL HIGH (ref <1.70)
Hep B S Ab: NEGATIVE
Hepatitis B Surface Ag: NEGATIVE
Total Lymphocytes %: 28 % (ref 12–46)
Total lymphocyte count: 1652 cells/mcL (ref 700–3300)
WBC, lymph enumeration: 5.9 10*3/uL (ref 4.0–10.5)

## 2008-04-30 ENCOUNTER — Ambulatory Visit: Payer: Self-pay | Admitting: Internal Medicine

## 2008-06-01 ENCOUNTER — Ambulatory Visit: Payer: Self-pay | Admitting: Internal Medicine

## 2008-06-01 ENCOUNTER — Encounter: Payer: Self-pay | Admitting: Internal Medicine

## 2008-06-01 LAB — CONVERTED CEMR LAB
ALT: 11 units/L (ref 0–35)
AST: 23 units/L (ref 0–37)
Absolute CD4: 371 #/uL — ABNORMAL LOW (ref 381–1469)
Albumin: 4.1 g/dL (ref 3.5–5.2)
Alkaline Phosphatase: 66 units/L (ref 39–117)
BUN: 29 mg/dL — ABNORMAL HIGH (ref 6–23)
Basophils Absolute: 0 10*3/uL (ref 0.0–0.1)
Basophils Relative: 1 % (ref 0–1)
CD4 T Helper %: 13 % — ABNORMAL LOW (ref 32–62)
CO2: 19 meq/L (ref 19–32)
Calcium: 9.7 mg/dL (ref 8.4–10.5)
Chloride: 106 meq/L (ref 96–112)
Creatinine, Ser: 1.67 mg/dL — ABNORMAL HIGH (ref 0.40–1.20)
Eosinophils Absolute: 0.2 10*3/uL (ref 0.0–0.7)
Eosinophils Relative: 3 % (ref 0–5)
Glucose, Bld: 96 mg/dL (ref 70–99)
HCT: 30.4 % — ABNORMAL LOW (ref 36.0–46.0)
HIV 1 RNA Quant: 1180 copies/mL — ABNORMAL HIGH (ref <50)
HIV-1 RNA Quant, Log: 3.07 — ABNORMAL HIGH (ref <1.70)
Hemoglobin: 9.7 g/dL — ABNORMAL LOW (ref 12.0–15.0)
Lymphocytes Relative: 56 % — ABNORMAL HIGH (ref 12–46)
Lymphs Abs: 2.8 10*3/uL (ref 0.7–4.0)
MCHC: 31.9 g/dL (ref 30.0–36.0)
MCV: 96.5 fL (ref 78.0–100.0)
Monocytes Absolute: 0.5 10*3/uL (ref 0.1–1.0)
Monocytes Relative: 9 % (ref 3–12)
Neutro Abs: 1.6 10*3/uL — ABNORMAL LOW (ref 1.7–7.7)
Neutrophils Relative %: 31 % — ABNORMAL LOW (ref 43–77)
Platelets: 391 10*3/uL (ref 150–400)
Potassium: 6.3 meq/L (ref 3.5–5.3)
RBC: 3.15 M/uL — ABNORMAL LOW (ref 3.87–5.11)
RDW: 17.1 % — ABNORMAL HIGH (ref 11.5–15.5)
Sodium: 136 meq/L (ref 135–145)
Total Bilirubin: 0.3 mg/dL (ref 0.3–1.2)
Total Lymphocytes %: 56 % — ABNORMAL HIGH (ref 12–46)
Total Protein: 9.3 g/dL — ABNORMAL HIGH (ref 6.0–8.3)
Total lymphocyte count: 2856 cells/mcL (ref 700–3300)
WBC, lymph enumeration: 5.1 10*3/uL (ref 4.0–10.5)
WBC: 5.1 10*3/uL (ref 4.0–10.5)

## 2008-06-02 ENCOUNTER — Ambulatory Visit: Payer: Self-pay | Admitting: Internal Medicine

## 2008-06-02 ENCOUNTER — Encounter (INDEPENDENT_AMBULATORY_CARE_PROVIDER_SITE_OTHER): Payer: Self-pay | Admitting: Nurse Practitioner

## 2008-06-02 LAB — CONVERTED CEMR LAB: Potassium: 5.5 meq/L — ABNORMAL HIGH (ref 3.5–5.3)

## 2008-06-15 ENCOUNTER — Ambulatory Visit: Payer: Self-pay | Admitting: Internal Medicine

## 2008-06-29 ENCOUNTER — Ambulatory Visit: Payer: Self-pay | Admitting: Internal Medicine

## 2008-07-30 ENCOUNTER — Encounter: Payer: Self-pay | Admitting: Internal Medicine

## 2008-07-30 LAB — CONVERTED CEMR LAB: CD4 Count: 228 microliters

## 2008-08-05 ENCOUNTER — Emergency Department (HOSPITAL_COMMUNITY): Admission: EM | Admit: 2008-08-05 | Discharge: 2008-08-05 | Payer: Self-pay | Admitting: Family Medicine

## 2008-09-10 ENCOUNTER — Ambulatory Visit: Payer: Self-pay | Admitting: Internal Medicine

## 2008-09-10 LAB — CONVERTED CEMR LAB
ALT: <8 units/L (ref 0–35)
AST: 14 units/L (ref 0–37)
Absolute CD4: 306 #/uL — ABNORMAL LOW (ref 381–1469)
Albumin: 4 g/dL (ref 3.5–5.2)
Alkaline Phosphatase: 84 units/L (ref 39–117)
BUN: 35 mg/dL — ABNORMAL HIGH (ref 6–23)
Basophils Absolute: 0 10*3/uL (ref 0.0–0.1)
Basophils Relative: 1 % (ref 0–1)
CD4 T Helper %: 15 % — ABNORMAL LOW (ref 32–62)
CO2: 17 meq/L — ABNORMAL LOW (ref 19–32)
Calcium: 9.7 mg/dL (ref 8.4–10.5)
Chloride: 107 meq/L (ref 96–112)
Creatinine, Ser: 3.64 mg/dL — ABNORMAL HIGH (ref 0.40–1.20)
Eosinophils Absolute: 0.1 10*3/uL (ref 0.0–0.7)
Eosinophils Relative: 2 % (ref 0–5)
Glucose, Bld: 91 mg/dL (ref 70–99)
HCT: 22.4 % — ABNORMAL LOW (ref 36.0–46.0)
HIV 1 RNA Quant: <50 copies/mL (ref <50)
HIV-1 RNA Quant, Log: <1.7 (ref <1.70)
Hemoglobin: 7.2 g/dL — CL (ref 12.0–15.0)
Lymphocytes Relative: 40 % (ref 12–46)
Lymphs Abs: 2.1 10*3/uL (ref 0.7–4.0)
MCHC: 32.1 g/dL (ref 30.0–36.0)
MCV: 106.7 fL — ABNORMAL HIGH (ref 78.0–100.0)
Monocytes Absolute: 0.5 10*3/uL (ref 0.1–1.0)
Monocytes Relative: 10 % (ref 3–12)
Neutro Abs: 2.4 10*3/uL (ref 1.7–7.7)
Neutrophils Relative %: 47 % (ref 43–77)
Platelets: 430 10*3/uL — ABNORMAL HIGH (ref 150–400)
Potassium: 5.5 meq/L — ABNORMAL HIGH (ref 3.5–5.3)
RBC: 2.1 M/uL — ABNORMAL LOW (ref 3.87–5.11)
RDW: 15.6 % — ABNORMAL HIGH (ref 11.5–15.5)
Sodium: 133 meq/L — ABNORMAL LOW (ref 135–145)
Total Bilirubin: 0.3 mg/dL (ref 0.3–1.2)
Total Lymphocytes %: 40 % (ref 12–46)
Total Protein: 8.3 g/dL (ref 6.0–8.3)
Total lymphocyte count: 2040 cells/mcL (ref 700–3300)
WBC, lymph enumeration: 5.1 10*3/uL (ref 4.0–10.5)
WBC: 5.1 10*3/uL (ref 4.0–10.5)

## 2008-09-14 ENCOUNTER — Emergency Department (HOSPITAL_COMMUNITY): Admission: EM | Admit: 2008-09-14 | Discharge: 2008-09-14 | Payer: Self-pay | Admitting: Emergency Medicine

## 2008-09-17 ENCOUNTER — Ambulatory Visit: Payer: Self-pay | Admitting: Internal Medicine

## 2008-09-17 LAB — CONVERTED CEMR LAB
BUN: 28 mg/dL — ABNORMAL HIGH (ref 6–23)
Basophils Absolute: 0 10*3/uL (ref 0.0–0.1)
Basophils Relative: 1 % (ref 0–1)
CO2: 21 meq/L (ref 19–32)
Calcium: 10.1 mg/dL (ref 8.4–10.5)
Chloride: 104 meq/L (ref 96–112)
Creatinine, Ser: 2.4 mg/dL — ABNORMAL HIGH (ref 0.40–1.20)
Eosinophils Absolute: 0.1 10*3/uL (ref 0.0–0.7)
Eosinophils Relative: 2 % (ref 0–5)
Folate: >20 ng/mL
Glucose, Bld: 101 mg/dL — ABNORMAL HIGH (ref 70–99)
HCT: 24.4 % — ABNORMAL LOW (ref 36.0–46.0)
Hemoglobin: 7.8 g/dL — CL (ref 12.0–15.0)
Lymphocytes Relative: 37 % (ref 12–46)
Lymphs Abs: 1.9 10*3/uL (ref 0.7–4.0)
MCHC: 32 g/dL (ref 30.0–36.0)
MCV: 106.6 fL — ABNORMAL HIGH (ref 78.0–100.0)
Monocytes Absolute: 0.4 10*3/uL (ref 0.1–1.0)
Monocytes Relative: 8 % (ref 3–12)
Neutro Abs: 2.8 10*3/uL (ref 1.7–7.7)
Neutrophils Relative %: 54 % (ref 43–77)
Platelets: 430 10*3/uL — ABNORMAL HIGH (ref 150–400)
Potassium: 5.5 meq/L — ABNORMAL HIGH (ref 3.5–5.3)
RBC: 2.29 M/uL — ABNORMAL LOW (ref 3.87–5.11)
RDW: 14.9 % (ref 11.5–15.5)
Sodium: 135 meq/L (ref 135–145)
Vitamin B-12: 572 pg/mL (ref 211–911)
WBC: 5.1 10*3/uL (ref 4.0–10.5)

## 2008-09-22 ENCOUNTER — Ambulatory Visit (HOSPITAL_COMMUNITY): Admission: RE | Admit: 2008-09-22 | Discharge: 2008-09-22 | Payer: Self-pay | Admitting: Internal Medicine

## 2008-09-22 IMAGING — US US RENAL
1 series · 14 of 25 positions shown · non-contrast
Comparison: None

CLINICAL DATA: Elevated creatinine

RENAL/URINARY TRACT ULTRASOUND
TECHNIQUE: Complete ultrasound examination of the urinary tract
was performed including evaluation of the kidneys, renal collecting
systems, and urinary bladder.

[Series 1: unknown · 0.27mm/px · 14 of 28 slices shown]
[im 1/28]
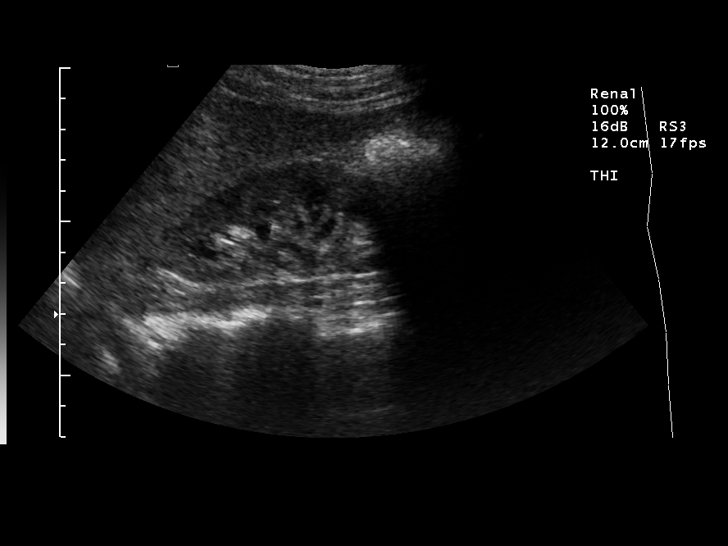
[im 3/28]
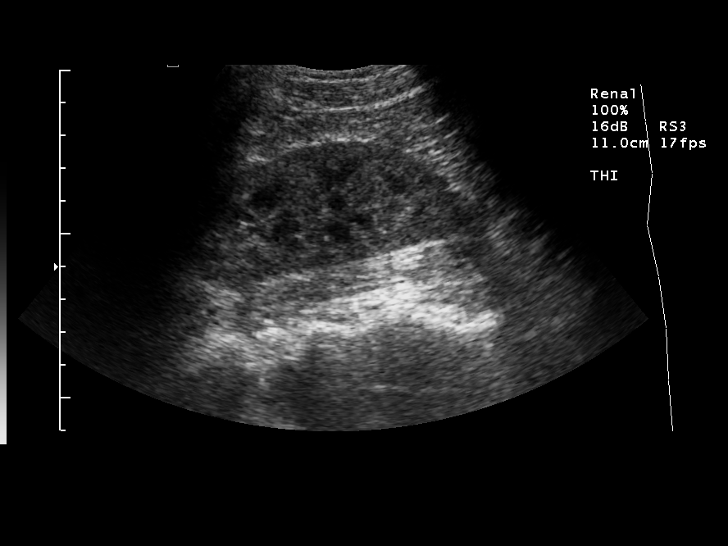
[im 5/28]
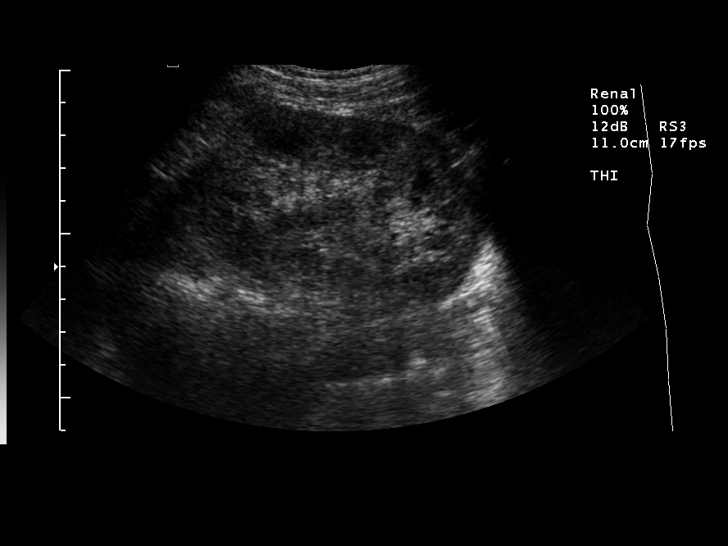
[im 7/28]
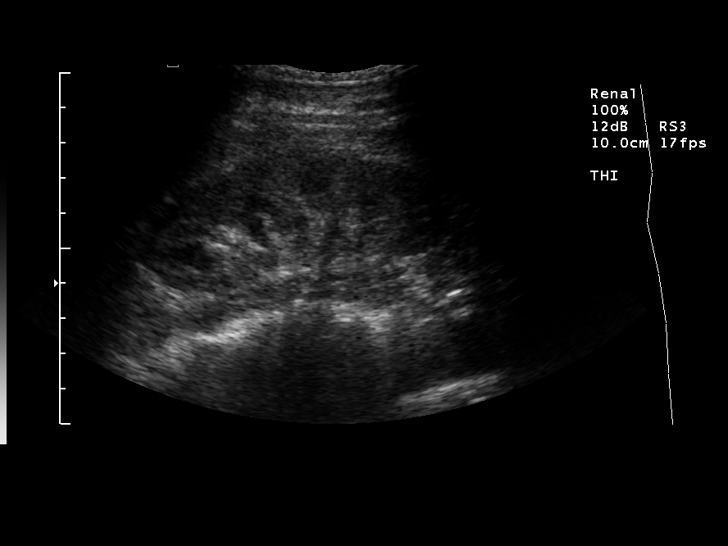
[im 10/28]
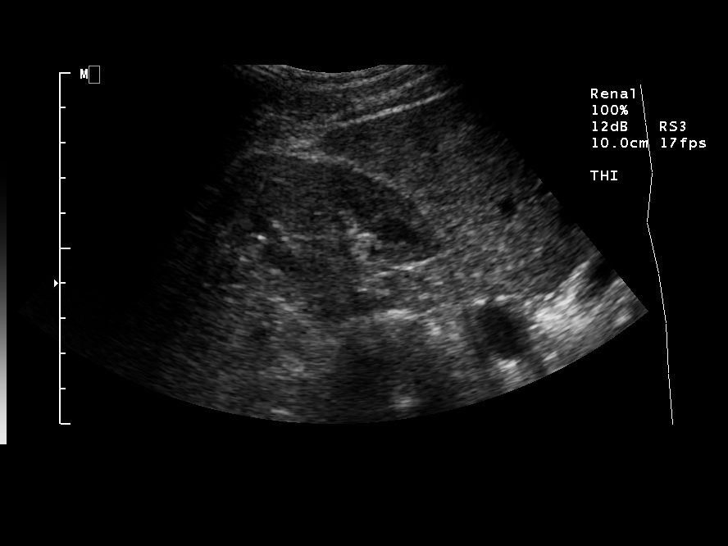
[im 11/28]
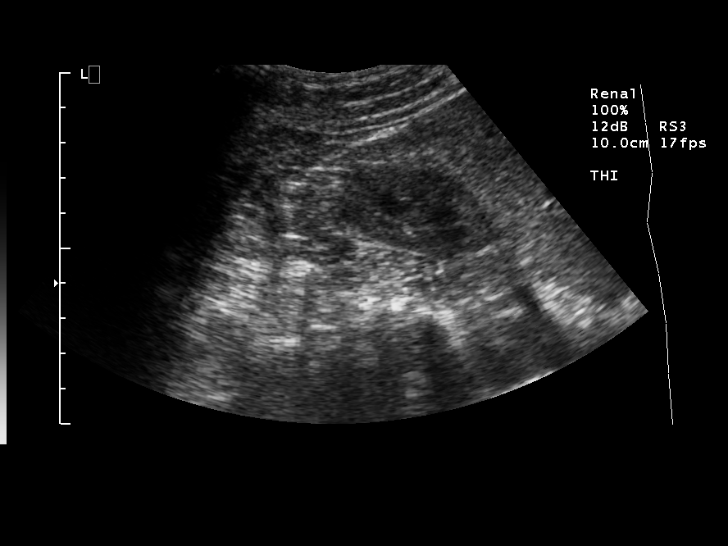
[im 13/28]
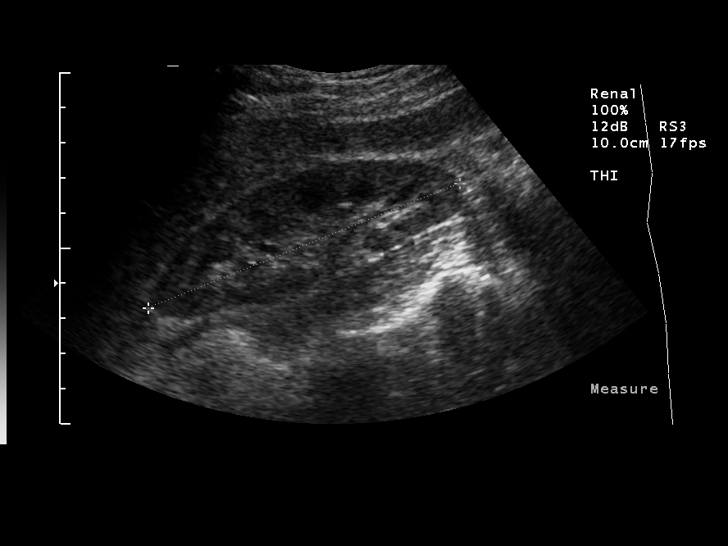
[im 15/28]
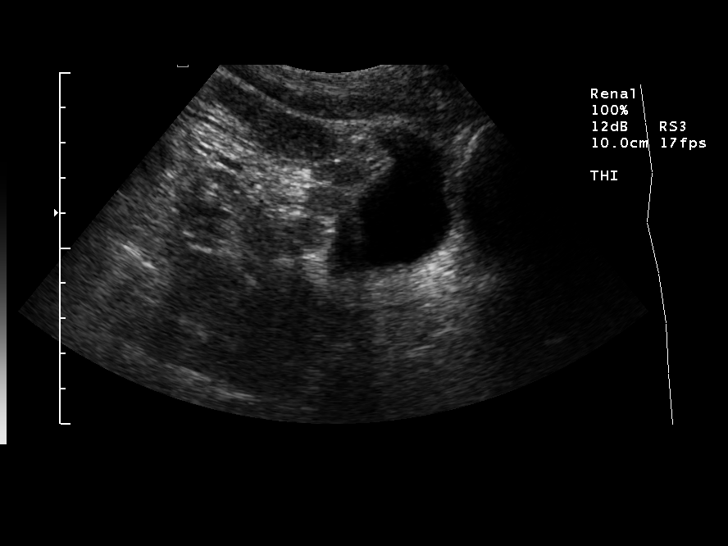
[im 17/28]
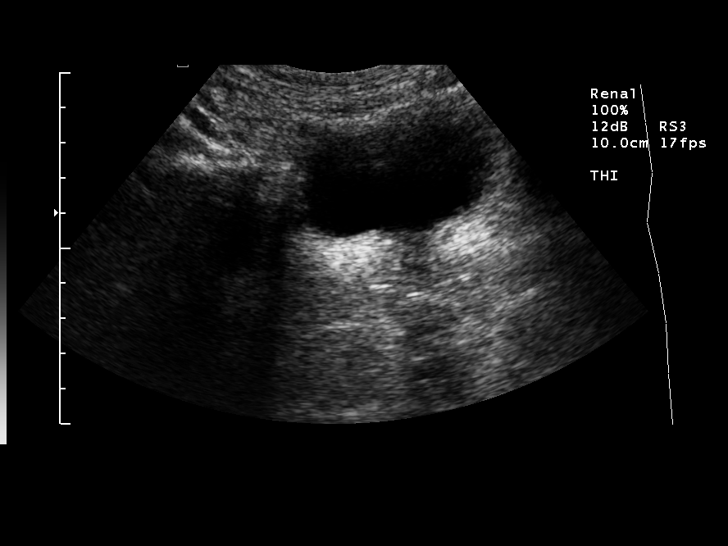
[im 19/28]
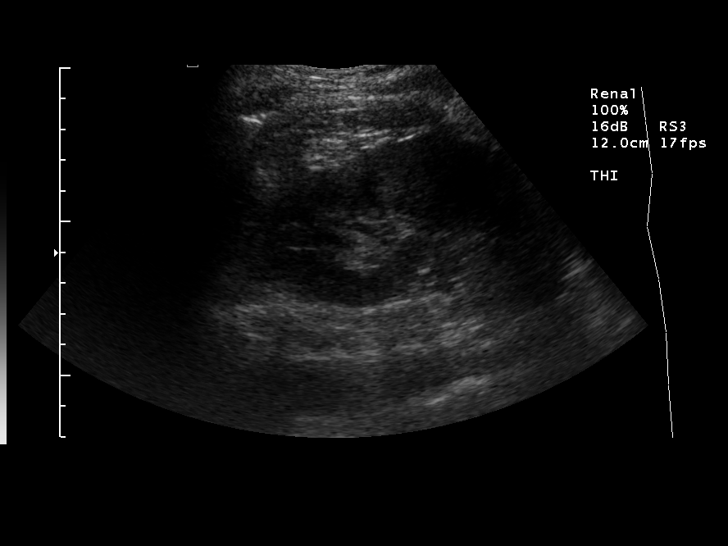
[im 21/28]
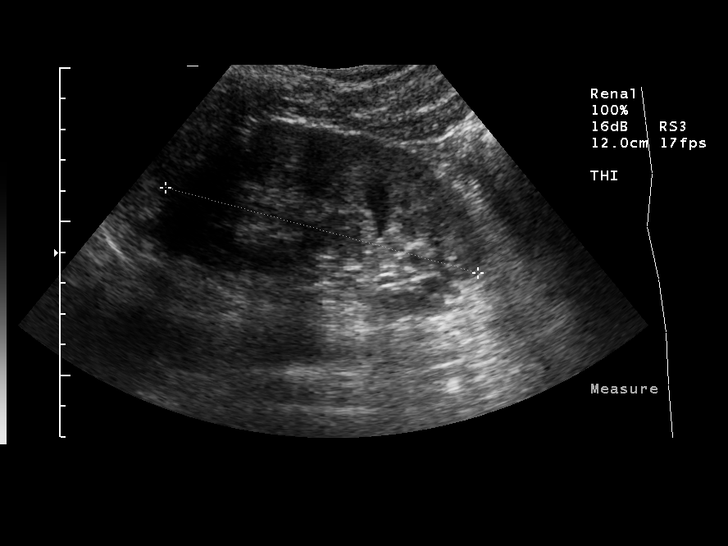
[im 23/28]
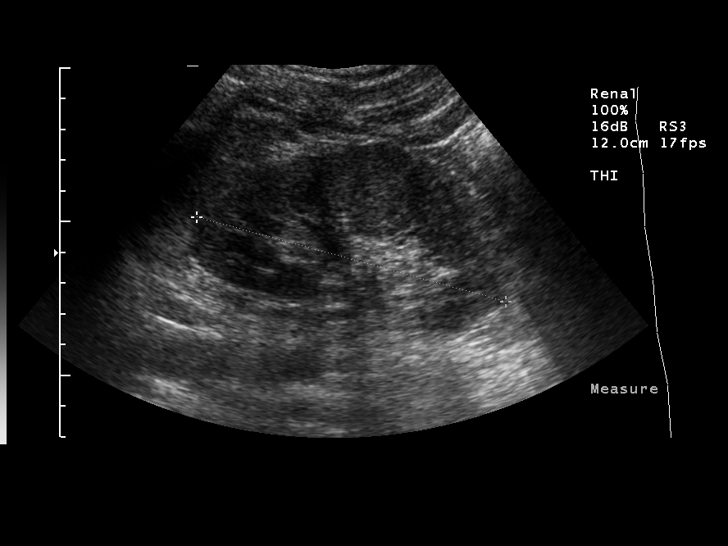
[im 25/28]
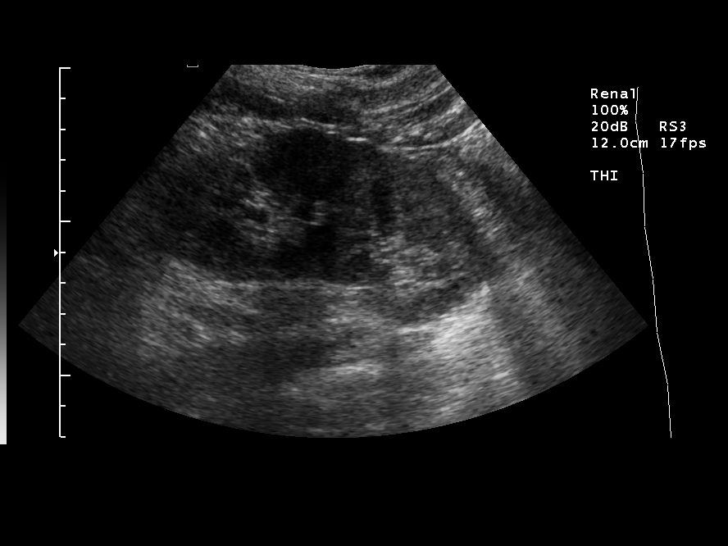
[im 28/28]
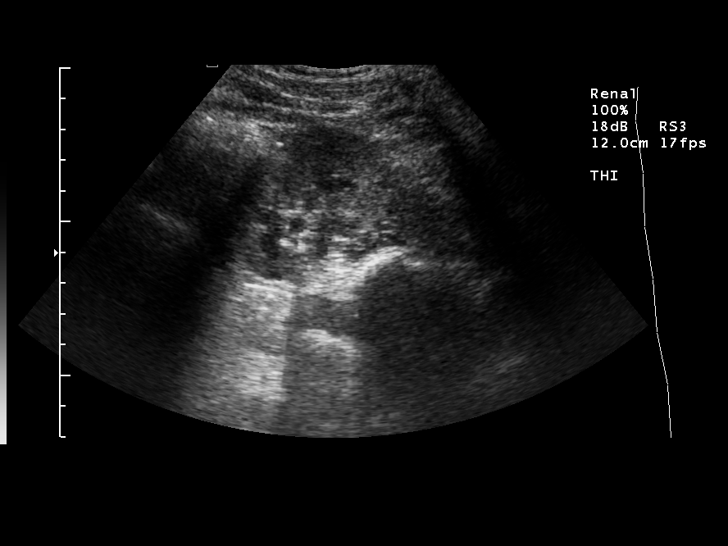

[14 of 25 positions shown; findings below may reference images not displayed]

FINDINGS: The right kidney measures 9.5 cm and the left kidney
measures 10.5 cm.  Both kidneys demonstrate slight increased
echogenicity which may suggest medical renal disease.  No
hydronephrosis, focal renal lesions or perinephric fluid
collections.  The bladder appears normal.
IMPRESSION: 1.  Increased echogenicity of both kidneys suggesting medical renal
disease.  No hydronephrosis.

## 2008-09-24 ENCOUNTER — Ambulatory Visit: Payer: Self-pay | Admitting: Internal Medicine

## 2008-09-24 ENCOUNTER — Encounter: Payer: Self-pay | Admitting: Internal Medicine

## 2008-09-24 LAB — CONVERTED CEMR LAB
Basophils Absolute: 0 10*3/uL (ref 0.0–0.1)
Basophils Relative: 0 % (ref 0–1)
Eosinophils Absolute: 0.1 10*3/uL (ref 0.0–0.7)
Eosinophils Relative: 1 % (ref 0–5)
HCT: 23.7 % — ABNORMAL LOW (ref 36.0–46.0)
Hemoglobin: 7.3 g/dL — CL (ref 12.0–15.0)
Lymphocytes Relative: 30 % (ref 12–46)
Lymphs Abs: 1.5 10*3/uL (ref 0.7–4.0)
MCHC: 30.8 g/dL (ref 30.0–36.0)
MCV: 107.2 fL — ABNORMAL HIGH (ref 78.0–100.0)
Monocytes Absolute: 0.4 10*3/uL (ref 0.1–1.0)
Monocytes Relative: 7 % (ref 3–12)
Neutro Abs: 3.1 10*3/uL (ref 1.7–7.7)
Neutrophils Relative %: 61 % (ref 43–77)
Platelets: 375 10*3/uL (ref 150–400)
RBC: 2.21 M/uL — ABNORMAL LOW (ref 3.87–5.11)
RDW: 14.5 % (ref 11.5–15.5)
Retic Ct Pct: 2.8 % (ref 0.4–3.1)
WBC: 5.1 10*3/uL (ref 4.0–10.5)

## 2008-10-19 ENCOUNTER — Ambulatory Visit: Payer: Self-pay | Admitting: Internal Medicine

## 2008-10-19 ENCOUNTER — Encounter: Payer: Self-pay | Admitting: Internal Medicine

## 2008-10-19 LAB — CONVERTED CEMR LAB
ALT: <8 units/L (ref 0–35)
AST: 10 units/L (ref 0–37)
Absolute CD4: 280 #/uL — ABNORMAL LOW (ref 381–1469)
Albumin: 3.9 g/dL (ref 3.5–5.2)
Alkaline Phosphatase: 74 units/L (ref 39–117)
BUN: 21 mg/dL (ref 6–23)
Basophils Absolute: 0 10*3/uL (ref 0.0–0.1)
Basophils Relative: 1 % (ref 0–1)
CD4 T Helper %: 21 % — ABNORMAL LOW (ref 32–62)
CO2: 22 meq/L (ref 19–32)
Calcium: 9.3 mg/dL (ref 8.4–10.5)
Chloride: 109 meq/L (ref 96–112)
Creatinine, Ser: 1.19 mg/dL (ref 0.40–1.20)
Eosinophils Absolute: 0.1 10*3/uL (ref 0.0–0.7)
Eosinophils Relative: 2 % (ref 0–5)
Glucose, Bld: 104 mg/dL — ABNORMAL HIGH (ref 70–99)
HCT: 25.9 % — ABNORMAL LOW (ref 36.0–46.0)
HIV 1 RNA Quant: 251 copies/mL — ABNORMAL HIGH (ref <48)
HIV-1 RNA Quant, Log: 2.4 — ABNORMAL HIGH (ref <1.68)
Hemoglobin: 7.9 g/dL — CL (ref 12.0–15.0)
Lymphocytes Relative: 37 % (ref 12–46)
Lymphs Abs: 1.3 10*3/uL (ref 0.7–4.0)
MCHC: 30.5 g/dL (ref 30.0–36.0)
MCV: 111.6 fL — ABNORMAL HIGH (ref 78.0–100.0)
Monocytes Absolute: 0.3 10*3/uL (ref 0.1–1.0)
Monocytes Relative: 9 % (ref 3–12)
Neutro Abs: 1.9 10*3/uL (ref 1.7–7.7)
Neutrophils Relative %: 51 % (ref 43–77)
Platelets: 343 10*3/uL (ref 150–400)
Potassium: 4.4 meq/L (ref 3.5–5.3)
RBC: 2.32 M/uL — ABNORMAL LOW (ref 3.87–5.11)
RDW: 16.9 % — ABNORMAL HIGH (ref 11.5–15.5)
Sodium: 143 meq/L (ref 135–145)
Total Bilirubin: 0.2 mg/dL — ABNORMAL LOW (ref 0.3–1.2)
Total Lymphocytes %: 37 % (ref 12–46)
Total Protein: 7.6 g/dL (ref 6.0–8.3)
Total lymphocyte count: 1332 cells/mcL (ref 700–3300)
Vitamin B-12: 364 pg/mL (ref 211–911)
WBC, lymph enumeration: 3.6 10*3/uL — ABNORMAL LOW (ref 4.0–10.5)
WBC: 3.6 10*3/uL — ABNORMAL LOW (ref 4.0–10.5)

## 2008-10-29 DIAGNOSIS — G609 Hereditary and idiopathic neuropathy, unspecified: Secondary | ICD-10-CM | POA: Insufficient documentation

## 2008-10-29 DIAGNOSIS — Z8679 Personal history of other diseases of the circulatory system: Secondary | ICD-10-CM | POA: Insufficient documentation

## 2008-10-29 DIAGNOSIS — N259 Disorder resulting from impaired renal tubular function, unspecified: Secondary | ICD-10-CM | POA: Insufficient documentation

## 2008-10-29 DIAGNOSIS — K219 Gastro-esophageal reflux disease without esophagitis: Secondary | ICD-10-CM | POA: Insufficient documentation

## 2008-10-29 DIAGNOSIS — D649 Anemia, unspecified: Secondary | ICD-10-CM | POA: Insufficient documentation

## 2008-10-29 DIAGNOSIS — B20 Human immunodeficiency virus [HIV] disease: Secondary | ICD-10-CM | POA: Insufficient documentation

## 2008-11-23 ENCOUNTER — Ambulatory Visit: Payer: Self-pay | Admitting: Internal Medicine

## 2009-01-21 ENCOUNTER — Emergency Department (HOSPITAL_COMMUNITY): Admission: EM | Admit: 2009-01-21 | Discharge: 2009-01-21 | Payer: Self-pay | Admitting: Emergency Medicine

## 2009-01-27 ENCOUNTER — Encounter: Payer: Self-pay | Admitting: Internal Medicine

## 2009-02-04 ENCOUNTER — Ambulatory Visit: Payer: Self-pay | Admitting: Internal Medicine

## 2009-02-04 ENCOUNTER — Encounter: Payer: Self-pay | Admitting: Internal Medicine

## 2009-02-04 DIAGNOSIS — R21 Rash and other nonspecific skin eruption: Secondary | ICD-10-CM

## 2009-02-04 DIAGNOSIS — L0202 Furuncle of face: Secondary | ICD-10-CM | POA: Insufficient documentation

## 2009-02-04 DIAGNOSIS — L0203 Carbuncle of face: Secondary | ICD-10-CM | POA: Insufficient documentation

## 2009-02-04 HISTORY — DX: Rash and other nonspecific skin eruption: R21

## 2009-02-04 LAB — CONVERTED CEMR LAB
ALT: <8 units/L (ref 0–35)
AST: 13 units/L (ref 0–37)
Absolute CD4: 367 #/uL — ABNORMAL LOW (ref 381–1469)
Albumin: 4.3 g/dL (ref 3.5–5.2)
Alkaline Phosphatase: 82 units/L (ref 39–117)
BUN: 18 mg/dL (ref 6–23)
Basophils Absolute: 0 10*3/uL (ref 0.0–0.1)
Basophils Relative: 1 % (ref 0–1)
CD4 T Helper %: 23 % — ABNORMAL LOW (ref 32–62)
CO2: 25 meq/L (ref 19–32)
Calcium: 9.5 mg/dL (ref 8.4–10.5)
Chloride: 106 meq/L (ref 96–112)
Creatinine, Ser: 1.03 mg/dL (ref 0.40–1.20)
Eosinophils Absolute: 0.1 10*3/uL (ref 0.0–0.7)
Eosinophils Relative: 2 % (ref 0–5)
Glucose, Bld: 99 mg/dL (ref 70–99)
HCT: 33.3 % — ABNORMAL LOW (ref 36.0–46.0)
HIV 1 RNA Quant: 180 copies/mL — ABNORMAL HIGH (ref <48)
HIV-1 RNA Quant, Log: 2.26 — ABNORMAL HIGH (ref <1.68)
Hemoglobin: 10.7 g/dL — ABNORMAL LOW (ref 12.0–15.0)
Lymphocytes Relative: 42 % (ref 12–46)
Lymphs Abs: 1.6 10*3/uL (ref 0.7–4.0)
MCHC: 32.1 g/dL (ref 30.0–36.0)
MCV: 115.2 fL — ABNORMAL HIGH (ref 78.0–100.0)
Monocytes Absolute: 0.3 10*3/uL (ref 0.1–1.0)
Monocytes Relative: 8 % (ref 3–12)
Neutro Abs: 1.8 10*3/uL (ref 1.7–7.7)
Neutrophils Relative %: 47 % (ref 43–77)
Platelets: 315 10*3/uL (ref 150–400)
Potassium: 4.5 meq/L (ref 3.5–5.3)
RBC: 2.89 M/uL — ABNORMAL LOW (ref 3.87–5.11)
RDW: 14.7 % (ref 11.5–15.5)
Sodium: 141 meq/L (ref 135–145)
Total Bilirubin: 0.3 mg/dL (ref 0.3–1.2)
Total Lymphocytes %: 42 % (ref 12–46)
Total Protein: 7.8 g/dL (ref 6.0–8.3)
Total lymphocyte count: 1596 cells/mcL (ref 700–3300)
WBC, lymph enumeration: 3.8 10*3/uL — ABNORMAL LOW (ref 4.0–10.5)
WBC: 3.8 10*3/uL — ABNORMAL LOW (ref 4.0–10.5)

## 2009-03-18 ENCOUNTER — Encounter: Payer: Self-pay | Admitting: Internal Medicine

## 2009-03-18 ENCOUNTER — Ambulatory Visit: Payer: Self-pay | Admitting: Internal Medicine

## 2009-03-23 ENCOUNTER — Encounter: Payer: Self-pay | Admitting: Internal Medicine

## 2009-04-09 ENCOUNTER — Encounter: Payer: Self-pay | Admitting: Internal Medicine

## 2009-04-09 ENCOUNTER — Telehealth: Payer: Self-pay | Admitting: Internal Medicine

## 2009-04-14 ENCOUNTER — Telehealth: Payer: Self-pay | Admitting: Internal Medicine

## 2009-05-04 ENCOUNTER — Encounter: Payer: Self-pay | Admitting: Internal Medicine

## 2009-05-13 ENCOUNTER — Ambulatory Visit: Payer: Self-pay | Admitting: Internal Medicine

## 2009-05-13 ENCOUNTER — Encounter: Payer: Self-pay | Admitting: Internal Medicine

## 2009-05-13 LAB — CONVERTED CEMR LAB
ALT: <8 U/L
AST: 14 U/L
Absolute CD4: 393 {#}/uL
Albumin: 4.4 g/dL
Alkaline Phosphatase: 69 U/L
BUN: 26 mg/dL — ABNORMAL HIGH
Basophils Absolute: 0 10*3/uL
Basophils Relative: 0 %
CD4 T Helper %: 23 % — ABNORMAL LOW
CO2: 24 meq/L
Calcium: 9.8 mg/dL
Chloride: 103 meq/L
Cholesterol: 264 mg/dL — ABNORMAL HIGH
Creatinine, Ser: 1.07 mg/dL
Eosinophils Absolute: 0.1 10*3/uL
Eosinophils Relative: 2 %
Glucose, Bld: 86 mg/dL
HCT: 33.4 % — ABNORMAL LOW
HDL: 86 mg/dL
HIV 1 RNA Quant: <48 {copies}/mL
HIV-1 RNA Quant, Log: <1.68
Hemoglobin: 11.2 g/dL — ABNORMAL LOW
LDL Cholesterol: 162 mg/dL — ABNORMAL HIGH
Lymphocytes Relative: 38 %
Lymphs Abs: 1.7 10*3/uL
MCHC: 33.5 g/dL
MCV: 115.6 fL — ABNORMAL HIGH
Monocytes Absolute: 0.3 10*3/uL
Monocytes Relative: 8 %
Neutro Abs: 2.4 10*3/uL
Neutrophils Relative %: 52 %
Platelets: 312 10*3/uL
Potassium: 5.3 meq/L
RBC: 2.89 M/uL — ABNORMAL LOW
RDW: 14.3 %
Sodium: 138 meq/L
Total Bilirubin: 0.3 mg/dL
Total CHOL/HDL Ratio: 3.1
Total Lymphocytes %: 38 %
Total Protein: 7.6 g/dL
Total lymphocyte count: 1710 {cells}/uL
Triglycerides: 81 mg/dL
VLDL: 16 mg/dL
WBC, lymph enumeration: 4.5 10*3/uL
WBC: 4.5 10*3/uL

## 2009-06-03 ENCOUNTER — Ambulatory Visit: Payer: Self-pay | Admitting: Internal Medicine

## 2009-06-03 ENCOUNTER — Encounter: Payer: Self-pay | Admitting: Internal Medicine

## 2009-06-03 DIAGNOSIS — E785 Hyperlipidemia, unspecified: Secondary | ICD-10-CM | POA: Insufficient documentation

## 2009-06-03 DIAGNOSIS — R634 Abnormal weight loss: Secondary | ICD-10-CM | POA: Insufficient documentation

## 2009-06-03 LAB — CONVERTED CEMR LAB: TSH: 1.432 microintl units/mL (ref 0.350–4.500)

## 2009-06-04 ENCOUNTER — Encounter: Payer: Self-pay | Admitting: Internal Medicine

## 2009-06-07 ENCOUNTER — Telehealth: Payer: Self-pay | Admitting: Internal Medicine

## 2009-06-10 ENCOUNTER — Telehealth: Payer: Self-pay | Admitting: Internal Medicine

## 2009-07-09 ENCOUNTER — Encounter: Payer: Self-pay | Admitting: Internal Medicine

## 2009-09-09 ENCOUNTER — Encounter: Payer: Self-pay | Admitting: Internal Medicine

## 2009-09-09 ENCOUNTER — Ambulatory Visit: Payer: Self-pay | Admitting: Internal Medicine

## 2009-09-09 LAB — CONVERTED CEMR LAB
Absolute CD4: 462 #/uL (ref 381–1469)
Basophils Absolute: 0 10*3/uL (ref 0.0–0.1)
Basophils Relative: 1 % (ref 0–1)
CD4 T Helper %: 24 % — ABNORMAL LOW (ref 32–62)
Eosinophils Absolute: 0.1 10*3/uL (ref 0.0–0.7)
Eosinophils Relative: 2 % (ref 0–5)
HCT: 31 % — ABNORMAL LOW (ref 36.0–46.0)
HIV 1 RNA Quant: <48 copies/mL (ref <48)
HIV-1 RNA Quant, Log: <1.68 (ref <1.68)
Hemoglobin: 9.8 g/dL — ABNORMAL LOW (ref 12.0–15.0)
Lymphocytes Relative: 47 % — ABNORMAL HIGH (ref 12–46)
Lymphs Abs: 1.9 10*3/uL (ref 0.7–4.0)
MCHC: 31.6 g/dL (ref 30.0–36.0)
MCV: 118.8 fL — ABNORMAL HIGH (ref 78.0–100.0)
Monocytes Absolute: 0.2 10*3/uL (ref 0.1–1.0)
Monocytes Relative: 5 % (ref 3–12)
Neutro Abs: 1.9 10*3/uL (ref 1.7–7.7)
Neutrophils Relative %: 46 % (ref 43–77)
Platelets: 340 10*3/uL (ref 150–400)
RBC: 2.61 M/uL — ABNORMAL LOW (ref 3.87–5.11)
RDW: 13.8 % (ref 11.5–15.5)
Total lymphocyte count: 1927 cells/mcL (ref 700–3300)
WBC: 4.1 10*3/uL (ref 4.0–10.5)

## 2009-09-17 ENCOUNTER — Telehealth: Payer: Self-pay | Admitting: Internal Medicine

## 2009-09-30 ENCOUNTER — Ambulatory Visit: Payer: Self-pay | Admitting: Internal Medicine

## 2009-09-30 ENCOUNTER — Encounter: Payer: Self-pay | Admitting: Internal Medicine

## 2009-09-30 LAB — CONVERTED CEMR LAB
ALT: <8 units/L (ref 0–35)
AST: 14 units/L (ref 0–37)
Albumin: 4.5 g/dL (ref 3.5–5.2)
Alkaline Phosphatase: 56 units/L (ref 39–117)
BUN: 23 mg/dL (ref 6–23)
CO2: 23 meq/L (ref 19–32)
Calcium: 9.7 mg/dL (ref 8.4–10.5)
Chloride: 106 meq/L (ref 96–112)
Creatinine, Ser: 1.08 mg/dL (ref 0.40–1.20)
Glucose, Bld: 107 mg/dL — ABNORMAL HIGH (ref 70–99)
Potassium: 4.9 meq/L (ref 3.5–5.3)
Sodium: 140 meq/L (ref 135–145)
Total Bilirubin: 0.3 mg/dL (ref 0.3–1.2)
Total Protein: 7.6 g/dL (ref 6.0–8.3)

## 2009-11-24 ENCOUNTER — Telehealth: Payer: Self-pay | Admitting: Internal Medicine

## 2009-11-24 ENCOUNTER — Other Ambulatory Visit: Admission: RE | Admit: 2009-11-24 | Discharge: 2009-11-24 | Payer: Self-pay | Admitting: Internal Medicine

## 2009-11-24 ENCOUNTER — Ambulatory Visit: Payer: Self-pay | Admitting: Internal Medicine

## 2009-11-24 ENCOUNTER — Encounter: Payer: Self-pay | Admitting: Internal Medicine

## 2009-11-30 ENCOUNTER — Encounter: Admission: RE | Admit: 2009-11-30 | Discharge: 2009-11-30 | Payer: Self-pay | Admitting: Internal Medicine

## 2009-11-30 IMAGING — US US BREAST L
1 series · 7 of 7 positions shown · non-contrast
Comparison: None, baseline

CLINICAL DATA: Palpable mass left breast.

DIGITAL DIAGNOSTIC  BILATERAL  MAMMOGRAM  WITH CAD AND LEFT BREAST
ULTRASOUND:

[Series 1: us breast left · 7 of 7 slices shown]
[im 1/7]
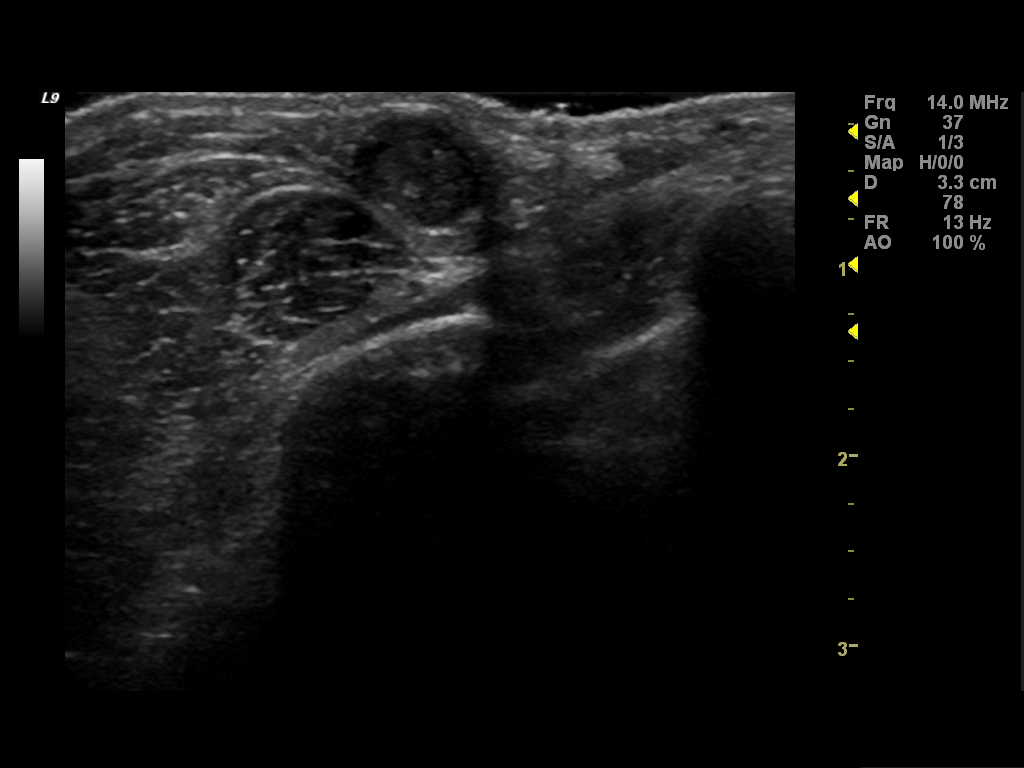
[im 2/7]
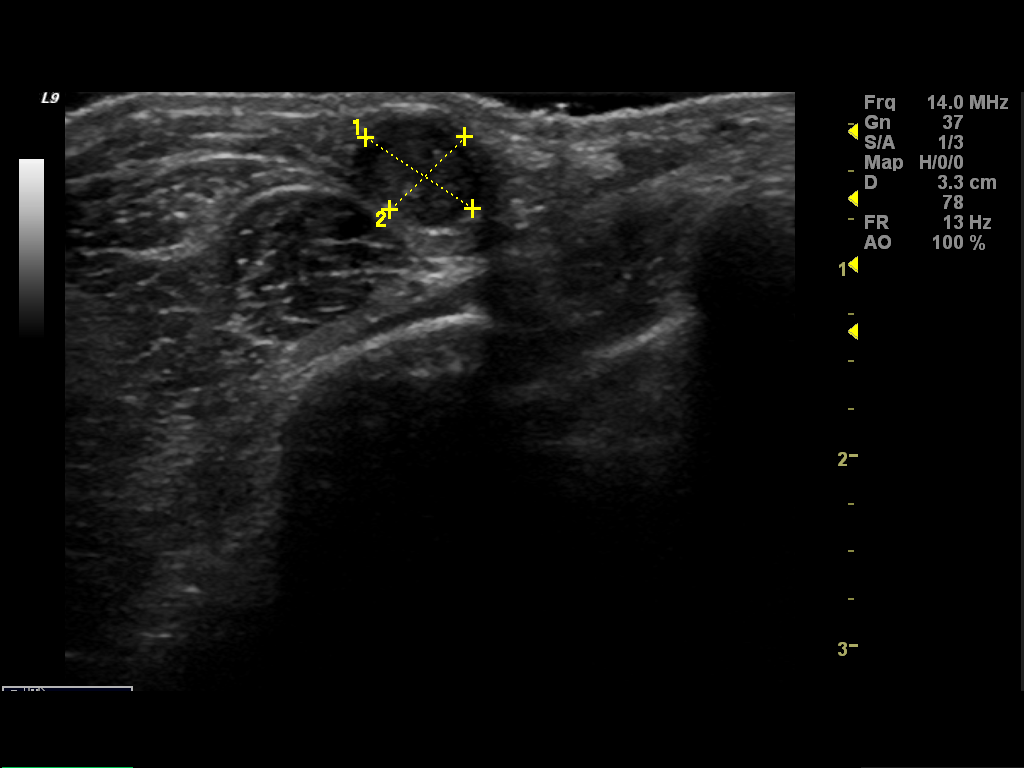
[im 3/7]
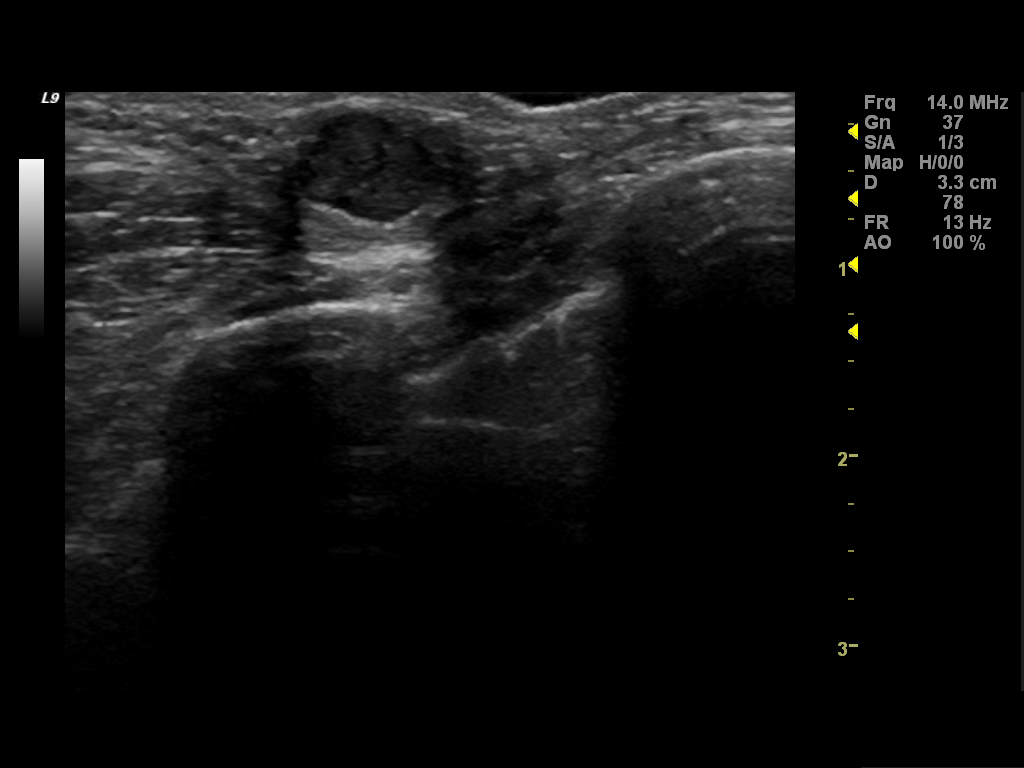
[im 4/7]
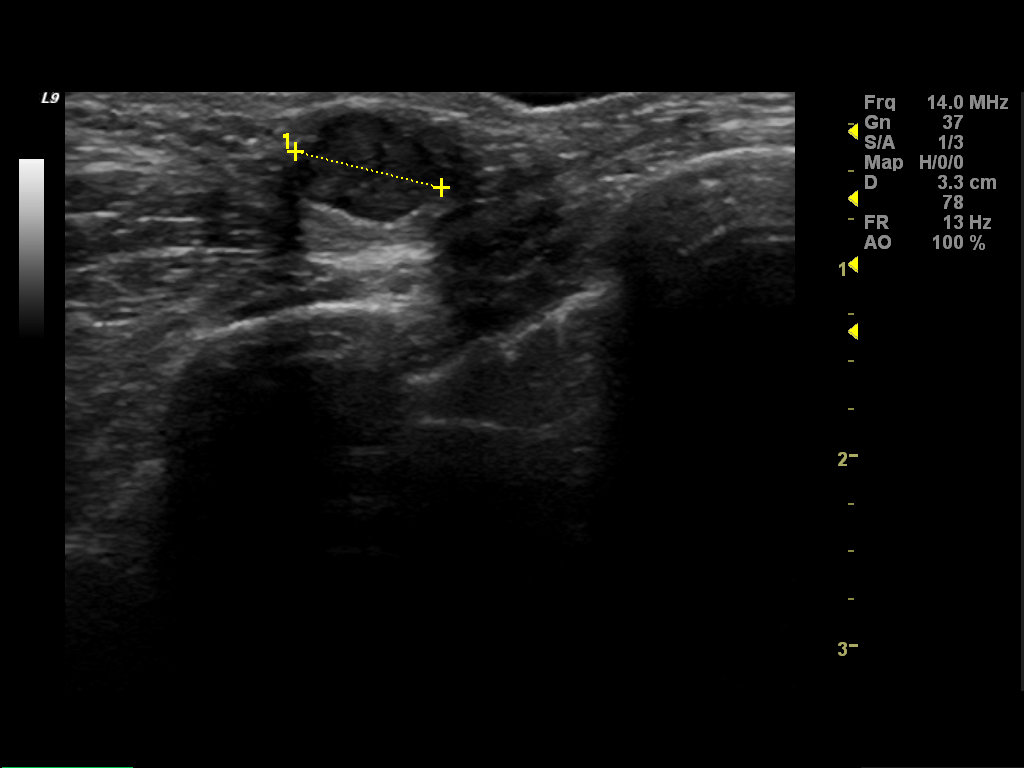
[im 5/7]
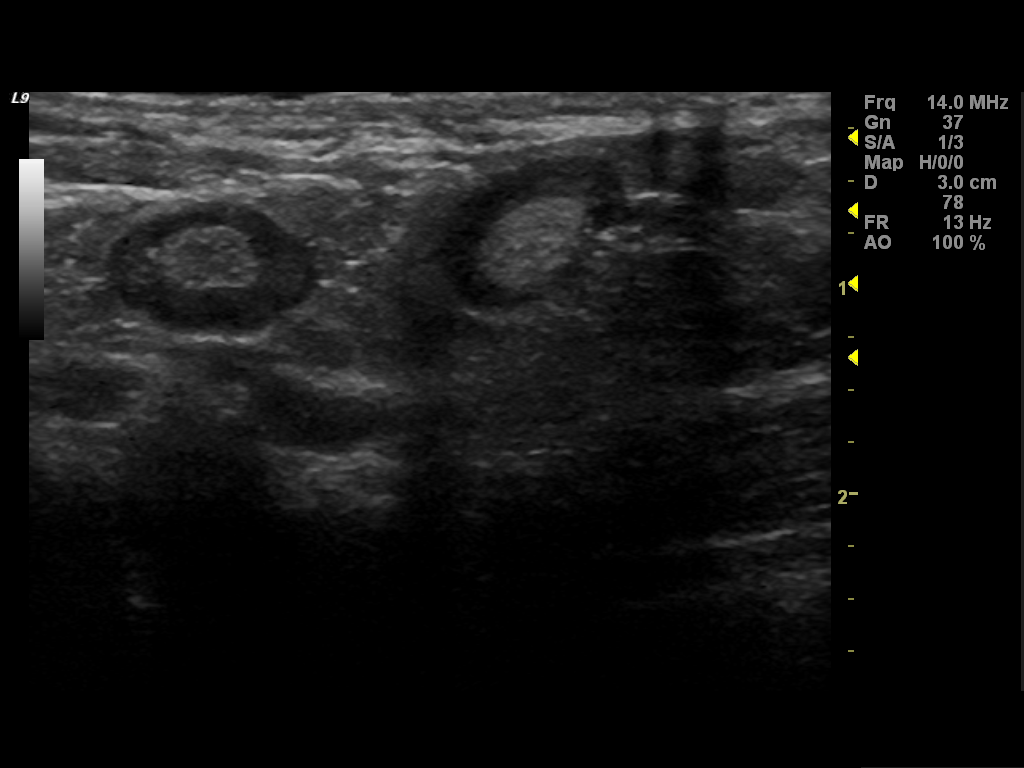
[im 6/7]
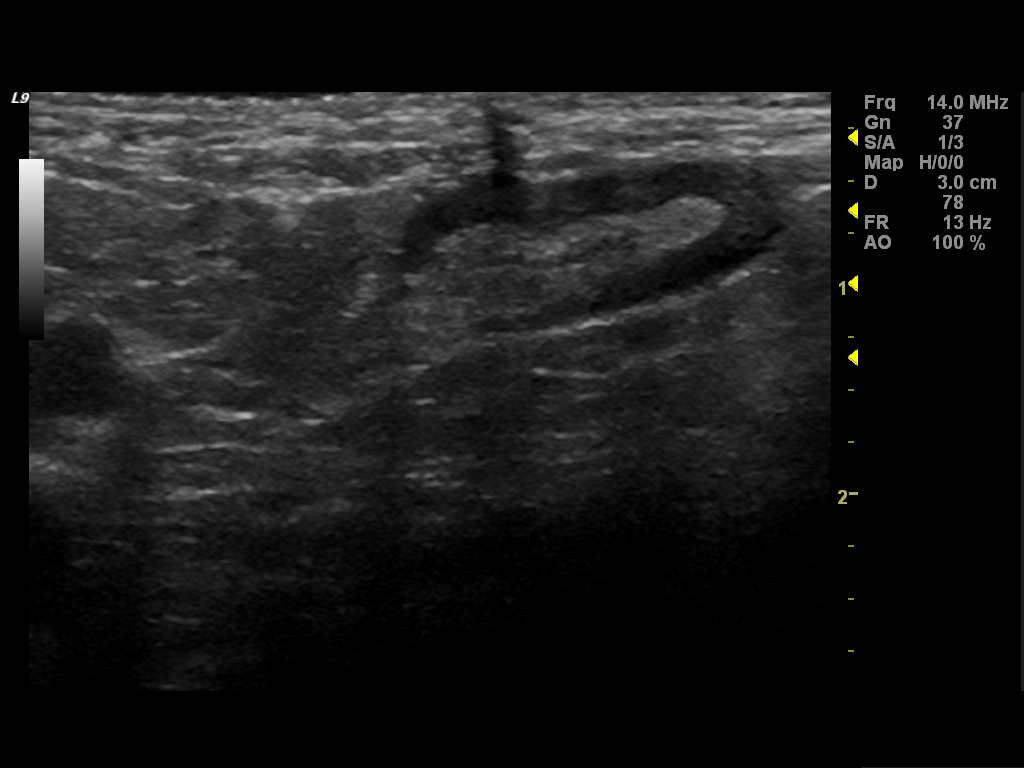
[im 7/7]
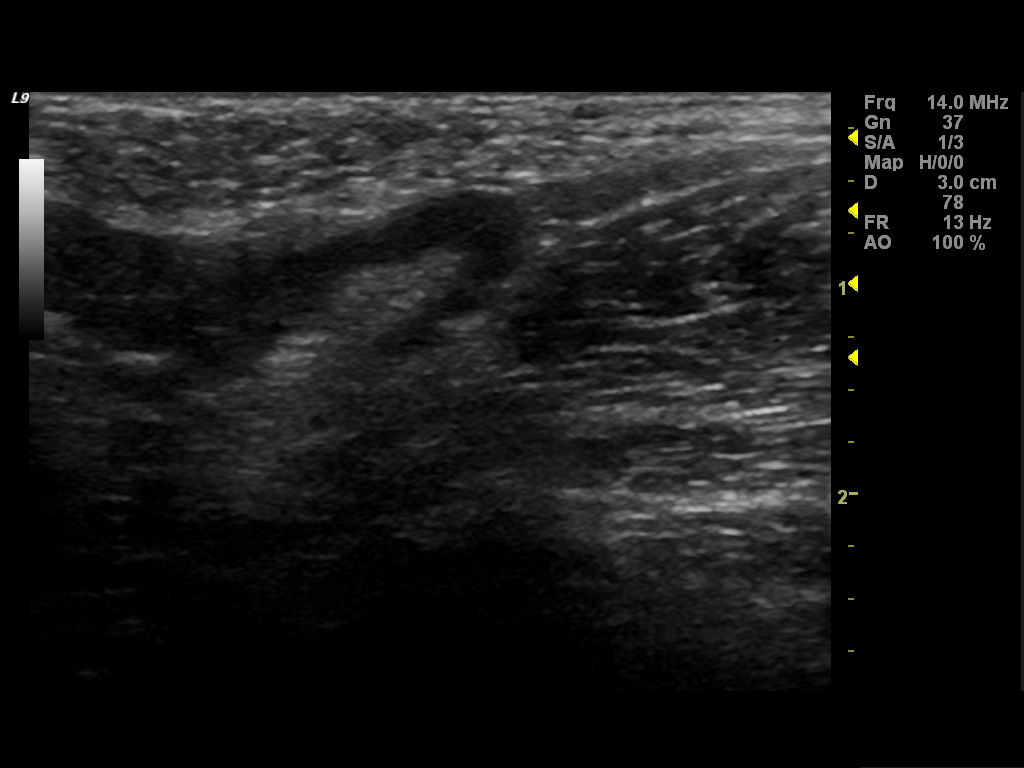

[7 of 7 positions shown; findings below may reference images not displayed]

FINDINGS: Breast parenchyma is extremely dense.  No discrete mass,
distortion, suspicious microcalcifications are identified to
suggest presence of malignancy in either breast.  Shows the
anterior margin of an otherwise obscured mass.
Mammographic images were processed with CAD.

On physical exam, I palpate a discrete mobile mass in the 2 o'clock
position of the left breast at the 6 cm from the nipple.  This is
nontender on physical exam.

Ultrasound is performed, showing a hypoechoic mass in the 2 o'clock
location measuring 7 x 6 x 8 mm.  There is increased through
transmission.  Findings are consistent with a benign fibroadenoma.
Evaluation of the left axilla shows symmetrically slightly enlarged
lymph nodes.  Findings are consistent benign HIV adenopathy.
IMPRESSION: Palpable abnormality corresponds to a probable fibroadenoma by
imaging.  I would recommend follow-up left ultrasound 6 months to
assess stability.  Full 2 years of follow-up would be suggested.

BI-RADS CATEGORY 3:  Probably benign finding(s) - short interval
follow-up suggested.

## 2009-11-30 IMAGING — MG MM DIGITAL DIAGNOSTIC BILAT
1 series · 1 of 1 positions shown · non-contrast
Comparison: None, baseline

CLINICAL DATA: Palpable mass left breast.

DIGITAL DIAGNOSTIC  BILATERAL  MAMMOGRAM  WITH CAD AND LEFT BREAST
ULTRASOUND:

[L CC]
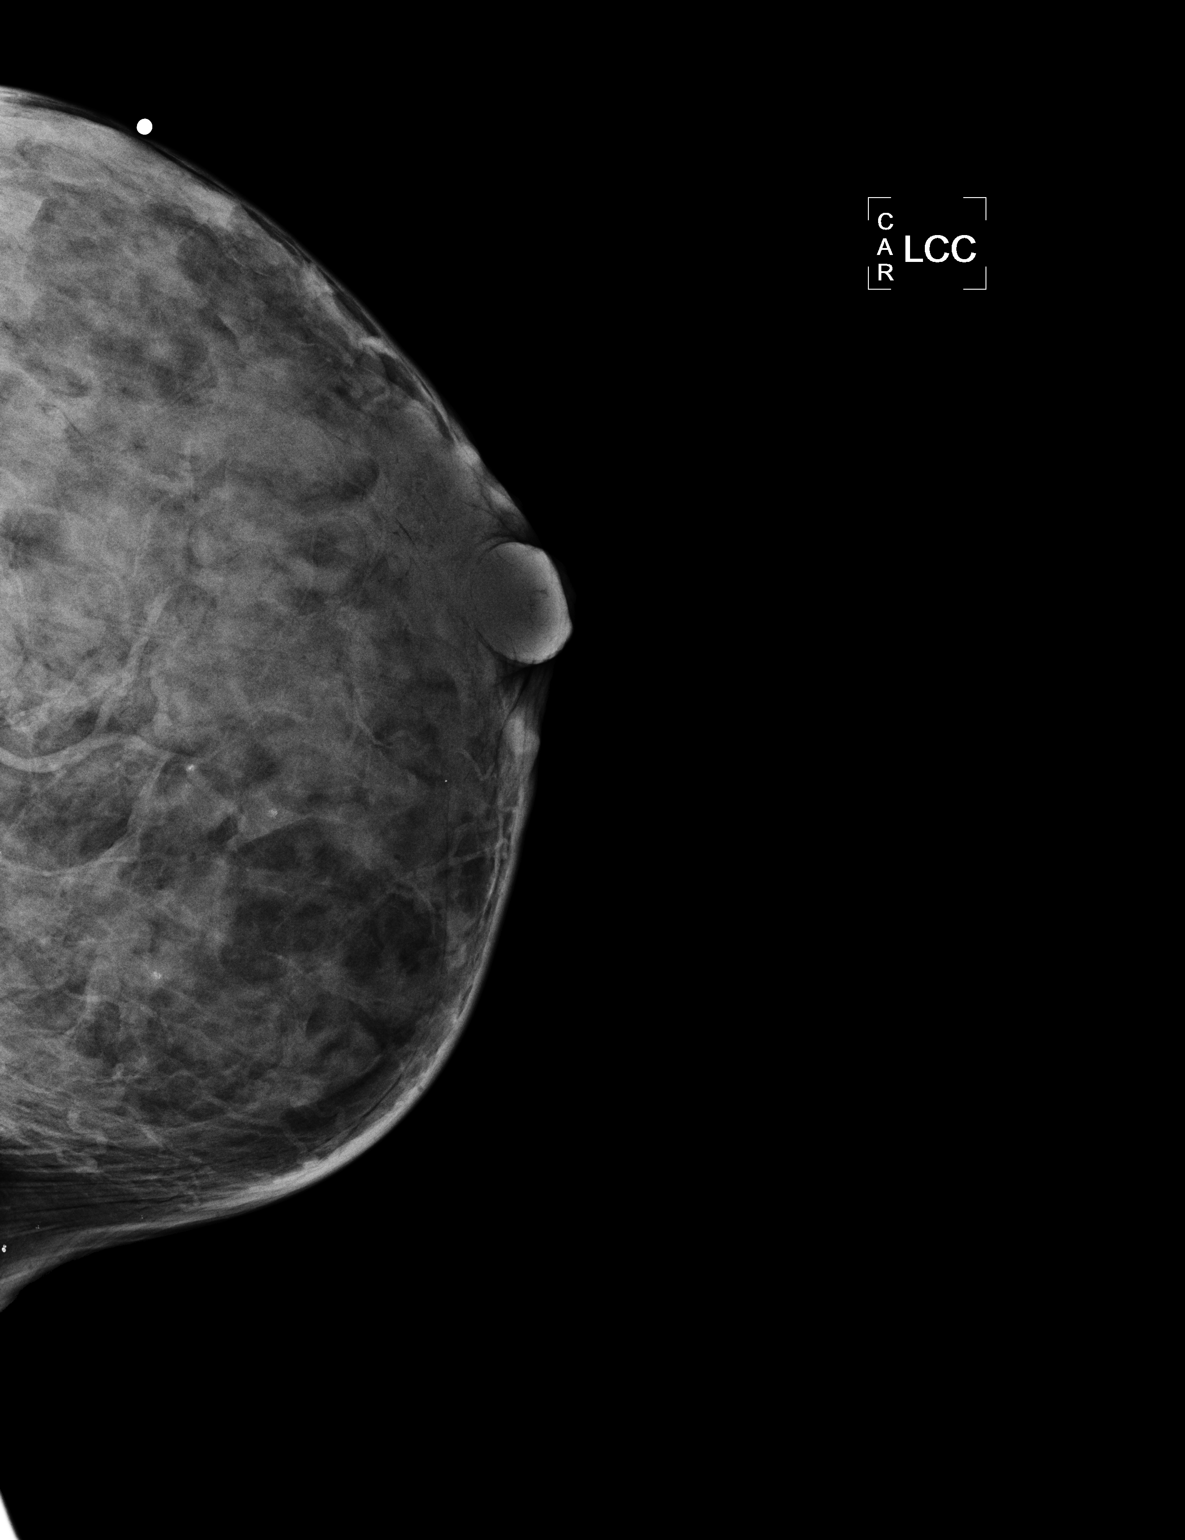

[1 of 1 positions shown; findings below may reference images not displayed]

FINDINGS: Breast parenchyma is extremely dense.  No discrete mass,
distortion, suspicious microcalcifications are identified to
suggest presence of malignancy in either breast.  Shows the
anterior margin of an otherwise obscured mass.
Mammographic images were processed with CAD.

On physical exam, I palpate a discrete mobile mass in the 2 o'clock
position of the left breast at the 6 cm from the nipple.  This is
nontender on physical exam.

Ultrasound is performed, showing a hypoechoic mass in the 2 o'clock
location measuring 7 x 6 x 8 mm.  There is increased through
transmission.  Findings are consistent with a benign fibroadenoma.
Evaluation of the left axilla shows symmetrically slightly enlarged
lymph nodes.  Findings are consistent benign HIV adenopathy.
IMPRESSION: Palpable abnormality corresponds to a probable fibroadenoma by
imaging.  I would recommend follow-up left ultrasound 6 months to
assess stability.  Full 2 years of follow-up would be suggested.

BI-RADS CATEGORY 3:  Probably benign finding(s) - short interval
follow-up suggested.

## 2009-12-05 ENCOUNTER — Emergency Department (HOSPITAL_COMMUNITY): Admission: EM | Admit: 2009-12-05 | Discharge: 2009-12-05 | Payer: Self-pay | Admitting: Family Medicine

## 2010-01-06 ENCOUNTER — Telehealth: Payer: Self-pay | Admitting: Internal Medicine

## 2010-01-06 ENCOUNTER — Ambulatory Visit: Payer: Self-pay | Admitting: Internal Medicine

## 2010-01-06 ENCOUNTER — Encounter: Payer: Self-pay | Admitting: Internal Medicine

## 2010-01-06 DIAGNOSIS — H60399 Other infective otitis externa, unspecified ear: Secondary | ICD-10-CM

## 2010-01-06 HISTORY — DX: Other infective otitis externa, unspecified ear: H60.399

## 2010-01-06 LAB — CONVERTED CEMR LAB
ALT: <8 units/L (ref 0–35)
AST: 14 units/L (ref 0–37)
Absolute CD4: 457 #/uL (ref 381–1469)
Albumin: 4.8 g/dL (ref 3.5–5.2)
Alkaline Phosphatase: 60 units/L (ref 39–117)
BUN: 20 mg/dL (ref 6–23)
Basophils Absolute: 0 10*3/uL (ref 0.0–0.1)
Basophils Relative: 1 % (ref 0–1)
CD4 T Helper %: 27 % — ABNORMAL LOW (ref 32–62)
CO2: 26 meq/L (ref 19–32)
Calcium: 10.1 mg/dL (ref 8.4–10.5)
Chloride: 104 meq/L (ref 96–112)
Creatinine, Ser: 0.9 mg/dL (ref 0.40–1.20)
Eosinophils Absolute: 0.1 10*3/uL (ref 0.0–0.7)
Eosinophils Relative: 1 % (ref 0–5)
Glucose, Bld: 74 mg/dL (ref 70–99)
HCT: 33.8 % — ABNORMAL LOW (ref 36.0–46.0)
HIV 1 RNA Quant: <48 copies/mL (ref <48)
HIV-1 RNA Quant, Log: <1.68 (ref <1.68)
Hemoglobin: 11.1 g/dL — ABNORMAL LOW (ref 12.0–15.0)
Lymphocytes Relative: 36 % (ref 12–46)
Lymphs Abs: 1.7 10*3/uL (ref 0.7–4.0)
MCHC: 32.8 g/dL (ref 30.0–36.0)
MCV: 120.7 fL — ABNORMAL HIGH (ref 78.0–100.0)
Monocytes Absolute: 0.4 10*3/uL (ref 0.1–1.0)
Monocytes Relative: 8 % (ref 3–12)
Neutro Abs: 2.6 10*3/uL (ref 1.7–7.7)
Neutrophils Relative %: 55 % (ref 43–77)
Platelets: 319 10*3/uL (ref 150–400)
Potassium: 4.8 meq/L (ref 3.5–5.3)
RBC: 2.8 M/uL — ABNORMAL LOW (ref 3.87–5.11)
RDW: 14 % (ref 11.5–15.5)
Sodium: 141 meq/L (ref 135–145)
Total Bilirubin: 0.3 mg/dL (ref 0.3–1.2)
Total Protein: 7.9 g/dL (ref 6.0–8.3)
Total lymphocyte count: 1692 cells/mcL (ref 700–3300)
WBC: 4.7 10*3/uL (ref 4.0–10.5)

## 2010-01-20 ENCOUNTER — Encounter: Payer: Self-pay | Admitting: Internal Medicine

## 2010-01-20 ENCOUNTER — Ambulatory Visit: Payer: Self-pay | Admitting: Internal Medicine

## 2010-01-20 DIAGNOSIS — J0100 Acute maxillary sinusitis, unspecified: Secondary | ICD-10-CM | POA: Insufficient documentation

## 2010-01-24 ENCOUNTER — Telehealth: Payer: Self-pay | Admitting: Internal Medicine

## 2010-02-11 ENCOUNTER — Encounter: Payer: Self-pay | Admitting: Internal Medicine

## 2010-02-16 ENCOUNTER — Telehealth: Payer: Self-pay | Admitting: Internal Medicine

## 2010-04-01 ENCOUNTER — Encounter: Payer: Self-pay | Admitting: Internal Medicine

## 2010-06-07 ENCOUNTER — Encounter: Payer: Self-pay | Admitting: Internal Medicine

## 2010-08-04 ENCOUNTER — Other Ambulatory Visit: Payer: Self-pay | Admitting: Internal Medicine

## 2010-08-04 ENCOUNTER — Ambulatory Visit: Payer: Self-pay | Admitting: Internal Medicine

## 2010-08-04 LAB — CONVERTED CEMR LAB
ALT: <8 units/L (ref 0–35)
AST: 11 units/L (ref 0–37)
Albumin: 4.3 g/dL (ref 3.5–5.2)
Alkaline Phosphatase: 58 units/L (ref 39–117)
BUN: 18 mg/dL (ref 6–23)
Basophils Absolute: 0 10*3/uL (ref 0.0–0.1)
Basophils Relative: 0 % (ref 0–1)
CO2: 24 meq/L (ref 19–32)
Calcium: 9.1 mg/dL (ref 8.4–10.5)
Chloride: 107 meq/L (ref 96–112)
Cholesterol: 234 mg/dL — ABNORMAL HIGH (ref 0–200)
Creatinine, Ser: 0.91 mg/dL (ref 0.40–1.20)
Eosinophils Absolute: 0.1 10*3/uL (ref 0.0–0.7)
Eosinophils Relative: 1 % (ref 0–5)
Glucose, Bld: 91 mg/dL (ref 70–99)
HCT: 32.2 % — ABNORMAL LOW (ref 36.0–46.0)
HDL: 74 mg/dL (ref >39)
HIV 1 RNA Quant: <48 copies/mL (ref <48)
HIV-1 RNA Quant, Log: <1.68 (ref <1.68)
Hemoglobin: 10.7 g/dL — ABNORMAL LOW (ref 12.0–15.0)
LDL Cholesterol: 137 mg/dL — ABNORMAL HIGH (ref 0–99)
Lymphocytes Relative: 38 % (ref 12–46)
Lymphs Abs: 1.6 10*3/uL (ref 0.7–4.0)
MCHC: 33.2 g/dL (ref 30.0–36.0)
MCV: 119.3 fL — ABNORMAL HIGH (ref 78.0–100.0)
Monocytes Absolute: 0.2 10*3/uL (ref 0.1–1.0)
Monocytes Relative: 5 % (ref 3–12)
Neutro Abs: 2.3 10*3/uL (ref 1.7–7.7)
Neutrophils Relative %: 55 % (ref 43–77)
Platelets: 300 10*3/uL (ref 150–400)
Potassium: 4.3 meq/L (ref 3.5–5.3)
RBC: 2.7 M/uL — ABNORMAL LOW (ref 3.87–5.11)
RDW: 14 % (ref 11.5–15.5)
Sodium: 140 meq/L (ref 135–145)
Total Bilirubin: 0.3 mg/dL (ref 0.3–1.2)
Total CHOL/HDL Ratio: 3.2
Total Protein: 6.9 g/dL (ref 6.0–8.3)
Triglycerides: 113 mg/dL (ref <150)
VLDL: 23 mg/dL (ref 0–40)
WBC: 4.2 10*3/uL (ref 4.0–10.5)

## 2010-09-12 ENCOUNTER — Ambulatory Visit: Payer: Self-pay | Admitting: Internal Medicine

## 2010-09-19 ENCOUNTER — Encounter: Payer: Self-pay | Admitting: Internal Medicine

## 2010-12-20 ENCOUNTER — Ambulatory Visit
Admission: RE | Admit: 2010-12-20 | Discharge: 2010-12-20 | Payer: Self-pay | Source: Home / Self Care | Attending: Internal Medicine | Admitting: Internal Medicine

## 2010-12-20 ENCOUNTER — Encounter: Payer: Self-pay | Admitting: Internal Medicine

## 2010-12-20 LAB — CONVERTED CEMR LAB
ALT: <8 units/L (ref 0–35)
AST: 15 units/L (ref 0–37)
Albumin: 4.8 g/dL (ref 3.5–5.2)
Alkaline Phosphatase: 68 units/L (ref 39–117)
BUN: 20 mg/dL (ref 6–23)
Basophils Absolute: 0 10*3/uL (ref 0.0–0.1)
Basophils Relative: 1 % (ref 0–1)
CO2: 24 meq/L (ref 19–32)
Calcium: 9.8 mg/dL (ref 8.4–10.5)
Chloride: 109 meq/L (ref 96–112)
Creatinine, Ser: 0.93 mg/dL (ref 0.40–1.20)
Eosinophils Absolute: 0.1 10*3/uL (ref 0.0–0.7)
Eosinophils Relative: 1 % (ref 0–5)
Glucose, Bld: 110 mg/dL — ABNORMAL HIGH (ref 70–99)
HCT: 33.3 % — ABNORMAL LOW (ref 36.0–46.0)
HIV 1 RNA Quant: <20 copies/mL (ref <20)
HIV-1 RNA Quant, Log: <1.3 (ref <1.30)
Hemoglobin: 11.3 g/dL — ABNORMAL LOW (ref 12.0–15.0)
Lymphocytes Relative: 45 % (ref 12–46)
Lymphs Abs: 1.9 10*3/uL (ref 0.7–4.0)
MCHC: 33.9 g/dL (ref 30.0–36.0)
MCV: 118.9 fL — ABNORMAL HIGH (ref 78.0–100.0)
Monocytes Absolute: 0.3 10*3/uL (ref 0.1–1.0)
Monocytes Relative: 8 % (ref 3–12)
Neutro Abs: 1.9 10*3/uL (ref 1.7–7.7)
Neutrophils Relative %: 45 % (ref 43–77)
Platelets: 327 10*3/uL (ref 150–400)
Potassium: 5.2 meq/L (ref 3.5–5.3)
RBC: 2.8 M/uL — ABNORMAL LOW (ref 3.87–5.11)
RDW: 13 % (ref 11.5–15.5)
Sodium: 144 meq/L (ref 135–145)
Total Bilirubin: 0.4 mg/dL (ref 0.3–1.2)
Total Protein: 7.6 g/dL (ref 6.0–8.3)
WBC: 4.2 10*3/uL (ref 4.0–10.5)

## 2011-01-04 ENCOUNTER — Ambulatory Visit
Admission: RE | Admit: 2011-01-04 | Discharge: 2011-01-04 | Payer: Self-pay | Source: Home / Self Care | Attending: Internal Medicine | Admitting: Internal Medicine

## 2011-01-08 ENCOUNTER — Encounter: Payer: Self-pay | Admitting: Internal Medicine

## 2011-01-13 ENCOUNTER — Ambulatory Visit: Admit: 2011-01-13 | Payer: Self-pay | Admitting: Internal Medicine

## 2011-01-17 NOTE — Progress Notes (Signed)
Summary: Labs results/rx for Megace  Phone Note Call from Patient   Summary of Call: Pt. would like to know the results of lab work done 06/03/09 (tsh). Was told if labs came back normal Dr. Philipp Deputy would write RX to help increase appetite. Please reply to Monia Sabal Thanks! Initial call taken by: Michelle Nasuti,  June 07, 2009 11:40 AM  Follow-up for Phone Call        will try megace 10ml Po qd Follow-up by: Yisroel Ramming MD,  June 07, 2009 11:47 AM    New/Updated Medications: MEGACE ORAL 40 MG/ML SUSP (MEGESTROL ACETATE) 10 ml Po once daily   Prescriptions: MEGACE ORAL 40 MG/ML SUSP (MEGESTROL ACETATE) 10 ml Po once daily  #1 x 3   Entered by:   Sharen Heck RN   Authorized by:   Yisroel Ramming MD   Signed by:   Yisroel Ramming MD on 06/09/2009   Method used:   Telephoned to ...       [facility]* (retail)       [address] Felicita Gage [address]       [place], [place]  [address]       Ph: [phone] or [phone]       Fax: [phone]   RxID:   [id number]  Patient advised per telephone call TSH was normal and that this rx was being called in. Sharen Heck RN

## 2011-01-17 NOTE — Progress Notes (Signed)
Summary: med refills  Phone Note Refill Request Message from:  Fax from Pharmacy on September 17, 2009 12:16 PM  Refills Requested: Medication #1:  SUSTIVA 600 MG TABS Take 1 tablet by mouth at bedtime   Dosage confirmed as above?Dosage Confirmed   Brand Name Necessary? No   Supply Requested: 1 month   Last Refilled: 08/05/2009  Medication #2:  COMBIVIR 150-300 MG TABS Take 1 tablet by mouth two times a day   Dosage confirmed as above?Dosage Confirmed   Brand Name Necessary? No   Supply Requested: 1 month   Last Refilled: 08/17/2009  Method Requested: Telephone to Pharmacy Next Appointment Scheduled: 09/27/09    Prescriptions: COMBIVIR 150-300 MG TABS (LAMIVUDINE-ZIDOVUDINE) Take 1 tablet by mouth two times a day  #60 x 3   Entered by:   Sharen Heck RN   Authorized by:   Yisroel Ramming MD   Signed by:   Yisroel Ramming MD on 09/17/2009   Method used:   Telephoned to ...       [facility]* (retail)       [address] Felicita Gage [address]       [place], [place]  [address]       Ph: [phone] or [phone]       Fax: [phone]   RxID:   [id number] SUSTIVA 600 MG TABS (EFAVIRENZ) Take 1 tablet by mouth at bedtime  #30 x 3   Entered by:   Sharen Heck RN   Authorized by:   Yisroel Ramming MD   Signed by:   Yisroel Ramming MD on 09/17/2009   Method used:   Telephoned to ...       [facility]* (retail)       [address] Felicita Gage [address]       [place], [place]  [address]       Ph: [phone] or [phone]       Fax: [phone]   RxID:   [id number]

## 2011-01-17 NOTE — Miscellaneous (Signed)
Summary: Janice Williams update  Clinical Lists Changes  Observations: Added new observation of PAYOR: Medicaid (05/04/2009 12:50) Added new observation of RWTITLE: D (05/04/2009 12:50) Added new observation of AIDSDAP: No (05/04/2009 12:50) Added new observation of HIV RISK BEH: Heterosexual contact (05/04/2009 12:50) Added new observation of INFECTDIS MD: Philipp Deputy (05/04/2009 12:50) Added new observation of DATE1STVISIT: 04/16/2008 (05/04/2009 12:50) Added new observation of HIV INIT DX: 04/03/2008 (05/04/2009 12:50) Added new observation of GENDER: Female (05/04/2009 12:50) Added new observation of MARITAL STAT: Single (05/04/2009 12:50) Added new observation of RACE: African American (05/04/2009 12:50) Added new observation of RW VITAL STA: Active (05/04/2009 12:50) Added new observation of PATNTCOUNTY: [place] (05/04/2009 12:50) Added new observation of RWPARTICIP: No (05/04/2009 12:50) Added new observation of LAST PPD DTE: 06/01/2008 (05/04/2009 12:50) Added new observation of CD4 COUNT: 228 microliters (07/30/2008 12:50)

## 2011-01-17 NOTE — Miscellaneous (Signed)
Summary: Office Visit ([facility])    Vital Signs:  Patient profile:   50 year old female Menstrual status:  perimenopausal Weight:      101 pounds Temp:     97.5 degrees F oral Pulse rate:   74 / minute Pulse rhythm:   regular Resp:     16 per minute BP sitting:   112 / 75  (left arm) CC: f/u 05 Pain Assessment Patient in pain? no       Does patient need assistance? Functional Status Self care Ambulation Normal   CC:  f/u 05.  History of Present Illness: Pt here to get her lab results. She is feeling good. No mised doses of her Atripla.  Preventive Screening-Counseling & Management  Alcohol-Tobacco     Alcohol drinks/day: 2     Alcohol type: beer     Smoking Status: quit  Caffeine-Diet-Exercise     Caffeine use/day: 0     Does Patient Exercise: no  Current Problems (verified): 1)  Hyperlipidemia  (ICD-272.4) 2)  Weight Loss  (ICD-783.21) 3)  Preventive Health Care  (ICD-V70.0) 4)  Facial Rash  (ICD-782.1) 5)  Carbuncle and Furuncle of Face  (ICD-680.0) 6)  Peripheral Neuropathy  (ICD-356.9) 7)  Renal Insufficiency  (ICD-588.9) 8)  Cerebrovascular Accident, Hx of  (ICD-V12.50) 9)  HIV Disease  (ICD-042) 10)  Gerd  (ICD-530.81) 11)  Anemia-nos  (ICD-285.9)  Current Medications (verified): 1)  Norvasc 10 Mg Tabs (Amlodipine Besylate) .... Take 1 Tablet By Mouth Once A Day 2)  Sustiva 600 Mg Tabs (Efavirenz) .... Take 1 Tablet By Mouth At Bedtime 3)  Prilosec 20 Mg Cpdr (Omeprazole) .... One Tab Daily 4)  Combivir 150-300 Mg Tabs (Lamivudine-Zidovudine) .... Take 1 Tablet By Mouth Two Times A Day 5)  Nasonex 50 Mcg/act Susp (Mometasone Furoate) .... One Spray Two Times A Day  Allergies: 1)  ! Codeine 2)  ! Pcn   Review of Systems  The patient denies anorexia and fever.     Physical Exam  General:  alert, well-hydrated, and underweight appearing.   Head:  normocephalic and atraumatic.   Mouth:  pharynx pink and moist.  no thrush  Lungs:   normal breath sounds.     Impression & Recommendations:  Problem # 1:  HIV DISEASE (ICD-042) Pt.s most recent CD4ct was 462 and VL <48 .  Pt instructed to continue the current antiretroviral regimen.  Pt encouraged to take medication regularly and not miss doses.  Pt will f/u in 3 months for repeat blood work and will see me 2 weeks later.  Influenza vaccine given.  Orders: T-Comprehensive Metabolic Panel [phone]) Est. Patient Level III ([address])  Diagnostics Reviewed:  HIV: CDC-defined AIDS (02/04/2009)   HIV-Western blot: Positive (04/03/2008)   CD4: 228 (07/30/2008)   WBC: 4.1 (09/09/2009)   Hgb: 9.8 (09/09/2009)   HCT: 31.0 (09/09/2009)   Platelets: 340 (09/09/2009) HIV-1 RNA: <48 copies/mL (09/09/2009)   HBSAg: NEG (04/16/2008)  Problem # 2:  ANEMIA-NOS (ICD-285.9) Pt instructed to get back on Iron supplements.  Prescriptions: PRILOSEC 20 MG CPDR (OMEPRAZOLE) one tab daily  #30 x 5   Entered and Authorized by:   Yisroel Ramming MD   Signed by:   Yisroel Ramming MD on 09/30/2009   Method used:   Print then Give to Patient   RxID:   [id number]    Influenza Vaccine    Vaccine Type: Fluvax Non-MCR    Site: left deltoid    Mfr: [employer]  Dose: 0.5 ml    Route: IM    Given by: Sharen Heck RN    Exp. Date: 06/16/2010    Lot #: [id number]    VIS given: 07/11/07 version given October 01, 2009.  Flu Vaccine Consent Questions    Do you have a history of severe allergic reactions to this vaccine? no    Any prior history of allergic reactions to egg and/or gelatin? no    Do you have a sensitivity to the preservative Thimersol? no    Do you have a past history of Guillan-Barre Syndrome? no    Do you currently have an acute febrile illness? no    Have you ever had a severe reaction to latex? no    Vaccine information given and explained to patient? yes    Are you currently pregnant? no  Appended Document: Office Visit ([facility])  Vaccine given 09/30/09. Sharen Heck RN

## 2011-01-17 NOTE — Assessment & Plan Note (Signed)
Summary: f/u ov/vs   CC:  follow-up visit, lab results, and pt. just took B/P meds.  History of Present Illness: Pt here for f/u.  She has been feeling well. She has been under stress do to living situation. No missed doses of her meds.  Preventive Screening-Counseling & Management  Alcohol-Tobacco     Alcohol drinks/day: 2     Alcohol type: beer     Smoking Status: current     Packs/Day: 0.25  Caffeine-Diet-Exercise     Caffeine use/day: 0     Does Patient Exercise: no  Hep-HIV-STD-Contraception     HIV Risk: no     Sun Exposure-Excessive: rarely  Safety-Violence-Falls     Seat Belt Use: 100      Drug Use:  former, crack cocaine, and clean since 2007.    Comments: pt. declined condoms   Updated Prior Medication List: NORVASC 10 MG TABS (AMLODIPINE BESYLATE) Take 1 tablet by mouth once a day SUSTIVA 600 MG TABS (EFAVIRENZ) Take 1 tablet by mouth at bedtime PRILOSEC 20 MG CPDR (OMEPRAZOLE) one tab daily COMBIVIR 150-300 MG TABS (LAMIVUDINE-ZIDOVUDINE) Take 1 tablet by mouth two times a day NASONEX 50 MCG/ACT SUSP (MOMETASONE FUROATE) one spray two times a day CORTISPORIN 3.5-10000-1 SOLN (NEOMYCIN-POLYMYXIN-HC) one drop left ear 4 times a day * FLUTICASONE NASAL SPRAY Use one spray twice daily ENSURE  LIQD (NUTRITIONAL SUPPLEMENTS) Drink one can 3 times a day.  Current Allergies (reviewed today): ! CODEINE ! PCN Past History:  Past Medical History: Last updated: 10/29/2008 Anemia-NOS GERD HIV disease Cerebrovascular accident, hx of  Social History: Drug Use:  former, crack cocaine, clean since 2007  Review of Systems  The patient denies anorexia, fever, and weight loss.    Vital Signs:  Patient profile:   50 year old female Menstrual status:  perimenopausal Height:      66.5 inches (168.91 cm) Weight:      94.8 pounds (43.09 kg) BMI:     15.13 Temp:     97.5 degrees F (36.39 degrees C) oral Pulse rate:   80 / minute BP sitting:   165 / 93  (left  arm)  Vitals Entered By: Wendall Mola CMA Duncan Dull) (September 12, 2010 11:02 AM) CC: follow-up visit, lab results, pt. just took B/P meds Is Patient Diabetic? No Pain Assessment Patient in pain? no      Nutritional Status BMI of < 19 = underweight Nutritional Status Detail appetite "good"  Does patient need assistance? Functional Status Self care Ambulation Normal Comments no missed doses of meds per pt.   Physical Exam  General:  alert, well-developed, well-nourished, and well-hydrated.   Head:  normocephalic and atraumatic.   Mouth:  pharynx pink and moist.   Lungs:  normal breath sounds.     Impression & Recommendations:  Problem # 1:  HIV DISEASE (ICD-042) Pt.s most recent CD4ct was 430  and VL <48 .  Pt instructed to continue the current antiretroviral regimen.  Pt encouraged to take medication regularly and not miss doses.  Pt will f/u in 3 months for repeat blood work and will see me 2 weeks later.  Diagnostics Reviewed:  HIV: CDC-defined AIDS (02/04/2009)   HIV-Western blot: Positive (04/03/2008)   CD4: 430 (08/05/2010)   WBC: 4.2 (08/04/2010)   Hgb: 10.7 (08/04/2010)   HCT: 32.2 (08/04/2010)   Platelets: 300 (08/04/2010) HIV-1 RNA: <48 copies/mL (08/04/2010)   HBSAg: NEG (04/16/2008)  Other Orders: Est. Patient Level III (332[address]) Influenza Vaccine NON MCR (  73710) Future Orders: T-CD4SP ([facility]) (CD4SP) ... 12/11/2010 T-HIV Viral Load [phone]) ... 12/11/2010 T-Comprehensive Metabolic Panel [phone]) ... 12/11/2010 T-CBC w/Diff ([phone]) ... 12/11/2010  Patient Instructions: 1)  Please schedule a follow-up appointment in 3 months, 2 weeks after labs.      Immunizations Administered:  Influenza Vaccine # 1:    Vaccine Type: Fluvax Non-MCR    Site: right deltoid    Mfr: [employer]    Dose: 0.5 ml    Route: IM    Given by: Wendall Mola CMA ( [facility])    Exp. Date: 03/19/2011    Lot #: [id number]    VIS given: 07/12/10 version given  September 12, 2010.  Flu Vaccine Consent Questions:    Do you have a history of severe allergic reactions to this vaccine? no    Any prior history of allergic reactions to egg and/or gelatin? no    Do you have a sensitivity to the preservative Thimersol? no    Do you have a past history of Guillan-Barre Syndrome? no    Do you currently have an acute febrile illness? no    Have you ever had a severe reaction to latex? no    Vaccine information given and explained to patient? yes    Are you currently pregnant? no

## 2011-01-17 NOTE — Medication Information (Signed)
Summary: RX Folder-Ensure 07-05-2009  RX Folder-Ensure 07-05-2009   Imported By: Percell Miller 07/09/2009 15:04:56  _____________________________________________________________________  External Attachment:    Type:   Image     Comment:   External Document

## 2011-01-17 NOTE — Miscellaneous (Signed)
Summary: Office Visit ([facility])    Vital Signs:  Patient profile:   50 year old female Menstrual status:  perimenopausal Weight:      102.2 pounds Temp:     98.9 degrees F oral Pulse rate:   90 / minute Pulse rhythm:   regular Resp:     17 per minute BP sitting:   117 / 82  (left arm)  Vitals Entered By: Sharen Heck RN (September 09, 2009 11:26 AM) CC: f/u 05, no longer on Neurontin, takes Prilosce occasionally Is Patient Diabetic? No Pain Assessment Patient in pain? no       Does patient need assistance? Functional Status Self care Ambulation Normal   CC:  f/u 05, no longer on Neurontin, and takes Prilosce occasionally.  History of Present Illness: Pt here for f/o.  She c/o some nasal congestion with clear drainage.  She has been taking her meds every day.  She just took her last norvasc and needs a refill.  Preventive Screening-Counseling & Management  Alcohol-Tobacco     Alcohol drinks/day: 2     Alcohol type: beer     Smoking Status: quit  Caffeine-Diet-Exercise     Caffeine use/day: 0     Does Patient Exercise: no  Current Problems (verified): 1)  Hyperlipidemia  (ICD-272.4) 2)  Weight Loss  (ICD-783.21) 3)  Preventive Health Care  (ICD-V70.0) 4)  Facial Rash  (ICD-782.1) 5)  Carbuncle and Furuncle of Face  (ICD-680.0) 6)  Peripheral Neuropathy  (ICD-356.9) 7)  Renal Insufficiency  (ICD-588.9) 8)  Cerebrovascular Accident, Hx of  (ICD-V12.50) 9)  HIV Disease  (ICD-042) 10)  Gerd  (ICD-530.81) 11)  Anemia-nos  (ICD-285.9)  Current Medications (verified): 1)  Norvasc 10 Mg Tabs (Amlodipine Besylate) .... Take 1 Tablet By Mouth Once A Day 2)  Sustiva 600 Mg Tabs (Efavirenz) .... Take 1 Tablet By Mouth At Bedtime 3)  Neurontin 300 Mg Caps (Gabapentin) .... Take 1 Tablet By Mouth Three Times A Day 4)  Prilosec 20 Mg Cpdr (Omeprazole) .... One Tab Daily 5)  Combivir 150-300 Mg Tabs (Lamivudine-Zidovudine) .... Take 1 Tablet By Mouth Two Times A  Day 6)  Nasonex 50 Mcg/act Susp (Mometasone Furoate) .... One Spray Two Times A Day  Allergies: 1)  ! Codeine 2)  ! Pcn   Review of Systems  The patient denies anorexia, fever, and weight loss.     Physical Exam  General:  alert, well-developed, well-nourished, and well-hydrated.   Head:  normocephalic and atraumatic.   Mouth:  pharynx pink and moist.   Lungs:  normal breath sounds.      Impression & Recommendations:  Problem # 1:  HIV DISEASE (ICD-042) Will obtain labs today and have pt f/u in 2 weeks.  Pt to continue current meds. 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  Problem # 3:  PREVENTIVE HEALTH CARE (ICD-V70.0) needs PAP - will schedule  Medications Added to Medication List This Visit: 1)  Nasonex 50 Mcg/act Susp (Mometasone furoate) .... One spray two times a day  Other Orders: T-CD4SP ([facility]) (CD4SP) T-HIV Viral Load [phone]) T-Comprehensive Metabolic Panel [id number]) T-CBC w/Diff ([id number]) Est. Patient Level III ([address])  Patient Instructions: 1)  Please schedule a follow-up appointment in 2 weeks. Prescriptions: NASONEX 50 MCG/ACT SUSP (MOMETASONE FUROATE) one spray two times a day  #1 x 5   Entered and Authorized by:   Yisroel Ramming MD   Signed by:   Yisroel Ramming MD on 09/09/2009   Method used:   Print then Give to Patient   RxID:   [id number] NORVASC 10 MG TABS (AMLODIPINE BESYLATE) Take 1 tablet by mouth once a day  #30 x 5   Entered and Authorized by:   Yisroel Ramming MD   Signed by:   Yisroel Ramming MD on 09/09/2009   Method used:   Print then Give to Patient   RxID:    [id number]

## 2011-01-17 NOTE — Miscellaneous (Signed)
Summary: Office Visit ([facility])    Vital Signs:  Patient profile:   50 year old female Menstrual status:  perimenopausal Weight:      101.0 pounds Temp:     97.3 degrees F oral Pulse rate:   92 / minute Pulse rhythm:   regular Resp:     16 per minute BP sitting:   112 / 82  (left arm)  Vitals Entered By: Michelle Nasuti (March 18, 2009 10:02 AM)   Screening mammogram scheduled at the [facility] on Fri. April 09, 2009 at 1140 am. Pt. advised of appt.'  Combivir rx called to [facility] at [phone] and order from Dr. Philipp Deputy given to d/c Retrovir and Epivir. Sharen Heck RN  March 18, 2009 1:10 PM                                                                  Is Patient Diabetic? No Pain Assessment Patient in pain? no       Does patient need assistance? Functional Status Self care Ambulation Normal  years   days  Menstrual Status perimenopausal   History of Present Illness: Pt has been under some stress with the move.  She otherwise feels well.  She has a Derm appt tomorrow for the soft tissue masses on her face.  Preventive Screening-Counseling & Management     Alcohol drinks/day: 2     Alcohol type: beer     Smoking Status: quit     Caffeine use/day: 0     Does Patient Exercise: no  Current Problems (verified): 1)  Preventive Health Care  (ICD-V70.0) 2)  Facial Rash  (ICD-782.1) 3)  Carbuncle and Furuncle of Face  (ICD-680.0) 4)  Peripheral Neuropathy  (ICD-356.9) 5)  Renal Insufficiency  (ICD-588.9) 6)  Cerebrovascular Accident, Hx of  (ICD-V12.50) 7)  HIV Disease  (ICD-042) 8)  Gerd  (ICD-530.81) 9)  Anemia-nos  (ICD-285.9)  Current Medications (verified): 1)  Norvasc 10 Mg Tabs (Amlodipine Besylate) .... Take 1 Tablet By Mouth Once A Day 2)  Sustiva 600 Mg Tabs (Efavirenz) .... Take 1 Tablet By Mouth At Bedtime 3)  Neurontin 300 Mg Caps (Gabapentin) .... Take 1 Tablet By  Mouth Three Times A Day 4)  Prilosec 20 Mg Cpdr (Omeprazole) .... One Tab Daily 5)  Combivir 150-300 Mg Tabs (Lamivudine-Zidovudine) .... Take 1 Tablet By Mouth Two Times A Day  Allergies: 1)  ! Codeine 2)  ! Pcn   Additional History    Menstrual Status:  perimenopausal  Review of Systems  The patient denies anorexia, fever, and weight loss.     Physical Exam  General:  alert, well-developed, well-nourished, and well-hydrated.   Head:  normocephalic and atraumatic.   soft tissue masses on face without signs of infection Mouth:  pharynx pink and moist.  no thrush  Lungs:  normal breath sounds.      Impression & Recommendations:  Problem # 1:  HIV DISEASE (ICD-042) Will obtain labs in  weeks.  Pt to continue her Sustiva and will change Retrovir/epivir to combivir now that creatinine has normalized. 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 Take 1 tablet by mouth two times a day  Other Orders: Est. Patient Level III ([address]) Future Orders: T-CD4 ([id number]) ... 04/29/2009 T-HIV Viral Load [id number]) ... 04/29/2009 T-Comprehensive Metabolic Panel [id number]) ... 04/29/2009 T-CBC w/Diff ([id number]) ... 04/29/2009 T-Lipid Profile [id number]) ... 04/29/2009    Patient Instructions: 1)  Please schedule a follow-up appointment in 8 weeks, 2 weeks after labs. Prescriptions: COMBIVIR 150-300 MG TABS (LAMIVUDINE-ZIDOVUDINE) Take 1 tablet  by mouth two times a day  #60 x 5   Entered and Authorized by:   Yisroel Ramming MD   Signed by:   Yisroel Ramming MD on 03/18/2009   Method used:   Print then Give to Patient   RxID:   [id number]

## 2011-01-17 NOTE — Letter (Signed)
Summary: Internal Correspondence  Internal Correspondence   Imported By: Paula Libra 06/04/2009 17:03:10  _____________________________________________________________________  External Attachment:    Type:   Image     Comment:   External Document

## 2011-01-17 NOTE — Progress Notes (Signed)
Summary: meds [facility]  Phone Note Refill Request      Prescriptions: NASONEX 50 MCG/ACT SUSP (MOMETASONE FUROATE) one spray two times a day  #1 x 5   Entered by:   Sharen Heck RN   Authorized by:   Yisroel Ramming MD   Signed by:   Yisroel Ramming MD on 01/07/2010   Method used:   Telephoned to ...       [facility] [employer] (mail-order)       [address] [place], [place]  [address]       Ph: [phone]       Fax: [phone]   RxID:   [id number] COMBIVIR 150-300 MG TABS (LAMIVUDINE-ZIDOVUDINE) Take 1 tablet by mouth two times a day  #60 x 5   Entered by:   Sharen Heck RN   Authorized by:   Yisroel Ramming MD   Signed by:   Yisroel Ramming MD on 01/07/2010   Method used:   Telephoned to ...       [facility] [employer] [facility] (mail-order)       [address] [place], [place]  [address]       Ph: [phone]       Fax: [phone]   RxID:   [id number] PRILOSEC 20 MG CPDR (OMEPRAZOLE) one tab daily  #30 x 5   Entered by:   Sharen Heck RN   Authorized by:   Yisroel Ramming MD   Signed by:   Yisroel Ramming MD on 01/07/2010   Method used:   Telephoned to ...       [facility] [employer] [facility] (mail-order)       [address] [place], [place]  [address]       Ph: [phone]       Fax: [phone]   RxID:   [id number] SUSTIVA 600 MG TABS (EFAVIRENZ) Take 1 tablet by mouth at bedtime  #30 x 5   Entered by:   Sharen Heck RN   Authorized by:   Yisroel Ramming MD   Signed by:   Yisroel Ramming MD on 01/07/2010   Method used:   Telephoned to ...       [facility] [employer] [facility] (mail[facility]order)       [address], [place]  [address]       Ph: [phone]       Fax: [phone]   RxID:   [id number] NORVASC 10 MG TABS (AMLODIPINE BESYLATE) Take 1 tablet by mouth once a day  #30 x 5   Entered by:   Sharen Heck RN   Authorized by:   Yisroel Ramming MD   Signed by:   Yisroel Ramming MD on 01/07/2010   Method used:   Telephoned to ...  [facility] [employer] [facility] (mail[facility]order)       [address] [place], [place]  [address]       Ph: [phone]       Fax: [phone]   RxID:   [id number]  [facility] application faxed.

## 2011-01-17 NOTE — Letter (Signed)
Summary: Internal Correspondence  Internal Correspondence   Imported By: Paula Libra 11/29/2009 14:10:42  _____________________________________________________________________  External Attachment:    Type:   Image     Comment:   External Document

## 2011-01-17 NOTE — Progress Notes (Signed)
Summary: med refill  Phone Note Refill Request   Refills Requested: Medication #1:  FLUTICASONE NASAL SPRAY Use one spray twice daily.   Dosage confirmed as above?Dosage Confirmed   Brand Name Necessary? No   Supply Requested: [facility]    Method Requested: Electronic Next Appointment Scheduled: seen 01/20/10 Initial call taken by: Gaylyn Cheers RN,  February 16, 2010 12:19 PM  Follow-up for Phone Call        ok x 6 Follow-up by: Yisroel Ramming MD,  February 17, 2010 10:02 AM    Prescriptions: FLUTICASONE NASAL SPRAY Use one spray twice daily  #1 x 5   Entered by:   Sharen Heck RN   Authorized by:   Yisroel Ramming MD   Signed by:   Yisroel Ramming MD on 02/21/2010   Method used:   Telephoned to ...       [facility] [employer] ([facility]order)       [address] [place], [place]  [address]       Ph: [phone]       Fax: [phone]   RxID:   [id number]

## 2011-01-17 NOTE — Medication Information (Signed)
Summary: [facility]:Enrollment Form  [facility]:Enrollment Form   Imported By: Florinda Marker 06/07/2010 16:03:43  _____________________________________________________________________  External Attachment:    Type:   Image     Comment:   External Document

## 2011-01-17 NOTE — Progress Notes (Signed)
Summary: omeprazole refill  Phone Note Refill Request Message from:  Fax from Pharmacy on June 10, 2009 11:04 AM  Refills Requested: Medication #1:  PRILOSEC 20 MG CPDR one tab daily   Dosage confirmed as above?Dosage Confirmed   Brand Name Necessary? No   Supply Requested: 1 month   Last Refilled: 05/07/2009  Method Requested: Telephone to Pharmacy Next Appointment Scheduled: 09/06/09 Initial call taken by: Sharen Heck RN,  June 10, 2009 11:08 AM      Prescriptions: PRILOSEC 20 MG CPDR (OMEPRAZOLE) one tab daily  #30 x 3   Entered by:   Sharen Heck RN   Authorized by:   Yisroel Ramming MD   Signed by:   Yisroel Ramming MD on 06/10/2009   Method used:   Telephoned to ...       [facility] (retail)       [address] Felicita Gage [address]       [place], [place]  [address]       Ph: [phone] or [phone]       Fax: [phone]   RxID:   [id number]

## 2011-01-17 NOTE — Miscellaneous (Signed)
Summary: RW Update  Clinical Lists Changes  Observations: Added new observation of DATE1STVISIT: 09/12/2010 (09/19/2010 16:06) Added new observation of RWPARTICIP: Yes (09/19/2010 16:06)

## 2011-01-17 NOTE — Miscellaneous (Signed)
Summary: Office Visit ([facility])    Vital Signs:  Patient profile:   50 year old female Menstrual status:  perimenopausal Weight:      95.8 pounds Temp:     98.8 degrees F oral Pulse rate:   97 / minute Pulse rhythm:   regular Resp:     18 per minute BP sitting:   130 / 96  (left arm)  Vitals Entered By: Sharen Heck RN (January 06, 2010 9:53 AM) CC: f/u 05, left ear pain x 2 weeks Is Patient Diabetic? No Pain Assessment Patient in pain? yes     Location: left ear Intensity: 10 Type: aching  Does patient need assistance? Functional Status Self care Ambulation Normal   CC:  f/u 05 and left ear pain x 2 weeks.  History of Present Illness: Pt c/o painful left ear for about a week.  No fever or chills.  Taking her HIV meds every day.  Preventive Screening-Counseling & Management  Alcohol-Tobacco     Alcohol drinks/day: 2     Alcohol type: beer     Smoking Status: quit  Caffeine-Diet-Exercise     Caffeine use/day: 0     Does Patient Exercise: no  Current Problems (verified): 1)  External Otitis  (ICD-380.10) 2)  Hyperlipidemia  (ICD-272.4) 3)  Weight Loss  (ICD-783.21) 4)  Preventive Health Care  (ICD-V70.0) 5)  Facial Rash  (ICD-782.1) 6)  Carbuncle and Furuncle of Face  (ICD-680.0) 7)  Peripheral Neuropathy  (ICD-356.9) 8)  Renal Insufficiency  (ICD-588.9) 9)  Cerebrovascular Accident, Hx of  (ICD-V12.50) 10)  HIV Disease  (ICD-042) 11)  Gerd  (ICD-530.81) 12)  Anemia-nos  (ICD-285.9)  Current Medications (verified): 1)  Norvasc 10 Mg Tabs (Amlodipine Besylate) .... Take 1 Tablet By Mouth Once A Day 2)  Sustiva 600 Mg Tabs (Efavirenz) .... Take 1 Tablet By Mouth At Bedtime 3)  Prilosec 20 Mg Cpdr (Omeprazole) .... One Tab Daily 4)  Combivir 150-300 Mg Tabs (Lamivudine-Zidovudine) .... Take 1 Tablet By Mouth Two Times A Day 5)  Nasonex 50 Mcg/act Susp (Mometasone Furoate) .... One Spray Two Times A Day 6)  Cortisporin 3.5-10000-1 Soln  (Neomycin-Polymyxin-Hc) .... One Drop Left Ear 4 Times A Day  Allergies: 1)  ! Codeine 2)  ! Pcn   Review of Systems  The patient denies anorexia, fever, weight loss, and decreased hearing.     Physical Exam  General:  alert, well-developed, well-nourished, and well-hydrated.   Head:  normocephalic and atraumatic.   Ears:  R ear normal and L canal inflamed.      Impression & Recommendations:  Problem # 1:  HIV DISEASE (ICD-042) Will obtain labs today.  Pt will return in 2 weeks for results. Diagnostics Reviewed:  HIV: CDC-defined AIDS (02/04/2009)   HIV-Western blot: Positive (04/03/2008)   CD4: 228 (07/30/2008)   WBC: 4.1 (09/09/2009)   Hgb: 9.8 (09/09/2009)   HCT: 31.0 (09/09/2009)   Platelets: 340 (09/09/2009) HIV-1 RNA: <48 copies/mL (09/09/2009)   HBSAg: NEG (04/16/2008)  Problem # 2:  EXTERNAL OTITIS (ICD-380.10)  will treat with cortisporin otic soln.  Her updated medication list for this problem includes:    Cortisporin 3.5-10000-1 Soln (Neomycin-polymyxin-hc) ..... One drop left ear 4 times a day  Medications Added to Medication List This Visit: 1)  Cortisporin 3.5-10000-1 Soln (Neomycin-polymyxin-hc) .... One drop left ear 4 times a day  Other Orders: T-CD4SP ([facility]) (CD4SP) T-HIV Viral Load [id number]) T-Comprehensive Metabolic Panel [id number]) T-CBC w/Diff [phone]) T-RPR (Syphilis) [phone])  Est. Patient Level III ([address])    Patient Instructions: 1)  Please schedule a follow-up appointment in 2 weeks. ]

## 2011-01-17 NOTE — Progress Notes (Signed)
Summary: Office Visit - well woman exam  Office Visit - well woman exam   Imported By: Paula Libra 11/29/2009 13:58:06  _____________________________________________________________________  External Attachment:    Type:   Image     Comment:   External Document

## 2011-01-17 NOTE — Assessment & Plan Note (Signed)
Summary: Office Visit (Infectious Disease)    CC:  hiv follow up. No other complaints pt si doing well.[name]  History of Present Illness: Pt here for f/u.  She has been taking her meds every day.  She is concerned that she can't gain weight. She eats 4-5 times a day.  No fever or night sweats. Pt c/o some chest congestion and cough for the last several days.  She just started mucinex.   Updated Prior Medication List: NORVASC 10 MG TABS (AMLODIPINE BESYLATE) Take 1 tablet by mouth once a day SUSTIVA 600 MG TABS (EFAVIRENZ) Take 1 tablet by mouth at bedtime NEURONTIN 300 MG CAPS (GABAPENTIN) Take 1 tablet by mouth three times a day PRILOSEC 20 MG CPDR (OMEPRAZOLE) one tab daily COMBIVIR 150-300 MG TABS (LAMIVUDINE-ZIDOVUDINE) Take 1 tablet by mouth two times a day  Current Allergies: ! CODEINE ! PCN Review of Systems       The patient complains of weight loss.  The patient denies anorexia and fever.    Vital Signs:  Patient profile:   50 year old female Menstrual status:  perimenopausal Weight:      100.5 pounds Temp:     98.1 degrees F oral Pulse rate:   88 / minute Pulse rhythm:   regular Resp:     18 per minute BP sitting:   100 / 70  (left arm)  Vitals Entered By: Michelle Nasuti (June 03, 2009 10:30 AM) CC: hiv follow up. No other complaints pt si doing well. Is Patient Diabetic? No Pain Assessment Patient in pain? no       Does patient need assistance? Functional Status Self care Ambulation Normal   Physical Exam  General:  alert, well-hydrated, and underweight appearing.   Head:  normocephalic and atraumatic.   Mouth:  pharynx pink and moist.  no thrush  Lungs:  normal breath sounds.      Impression & Recommendations:  Problem # 1:  HIV DISEASE (ICD-042) Pt.s most recent CD4ct was 393 and VL <48 .  Pt instructed to continue the current antiretroviral regimen.  Pt encouraged to take medication regularly and not miss doses.  Pt will f/u in 3 months for  repeat blood work and will see me 2 weeks later.  Diagnostics Reviewed:  HIV: CDC-defined AIDS (02/04/2009)   HIV-Western blot: Positive (04/03/2008)   CD4: 228 (07/30/2008)   WBC: 4.5 (05/13/2009)   Hgb: 11.2 (05/13/2009)   HCT: 33.4 (05/13/2009)   Platelets: 312 (05/13/2009) HIV-1 RNA: <48 copies/mL (05/13/2009)   HBSAg: NEG (04/16/2008)  Problem # 2:  HYPERLIPIDEMIA (ICD-272.4) discussed diet and exercise  Problem # 3:  WEIGHT LOSS (ICD-783.21) Will check thyroid function.  If nl consider megace. Orders: T-TSH [phone])  Other Orders: Est. Patient Level III ([address]) Future Orders: T-CD4 ([id number]) ... 09/01/2009 T-HIV Viral Load [id number]) ... 09/01/2009 T-Comprehensive Metabolic Panel [id number]) ... 09/01/2009 T-CBC w/Diff ([id number]) ... 09/01/2009 T-Lipid Profile [phone]) ... 09/01/2009  Patient Instructions: 1)  Please schedule a follow-up appointment in 3 months, 2 weeks after labs.

## 2011-01-17 NOTE — Miscellaneous (Signed)
Summary: Office Visit ([facility])     Chief Complaint:  hiv follow up.  History of Present Illness: Pt here for f/u.  She has been taking her HIV meds regularly.  She c/o recurrent boils on her face.  She has one now that is painful.  She has finally moved in to her new apt.  She will f/u with [facility] now that she is settled.    Prior Medications Reviewed Using: Patient Recall  Updated Prior Medication List: NORVASC 10 MG TABS (AMLODIPINE BESYLATE) Take 1 tablet by mouth once a day RETROVIR 300 MG TABS (ZIDOVUDINE) Take 1 tablet by mouth two times a day EPIVIR 150 MG TABS (LAMIVUDINE) Take 1 tablet by mouth once a day SUSTIVA 600 MG TABS (EFAVIRENZ) Take 1 tablet by mouth at bedtime NEURONTIN 300 MG CAPS (GABAPENTIN) Take 1 tablet by mouth three times a day PRILOSEC 20 MG CPDR (OMEPRAZOLE) one tab daily  Current Allergies (reviewed today): ! CODEINE ! PCN  Past Medical History:    Reviewed history from 10/29/2008 and no changes required:       Anemia-NOS       GERD       HIV disease       Cerebrovascular accident, hx of    Risk Factors:  Tobacco use:  never Drug use:  no HIV high-risk behavior:  no Caffeine use:  1 drinks per day Alcohol use:  yes    Type:  beer occcasionally    Drinks per day:  <1    Has patient --       Felt need to cut down:  yes       Been annoyed by complaints:  no       Felt guilty about drinking:  no       Needed eye opener in the morning:  no Exercise:  no Seatbelt use:  100 % Sun Exposure:  rarely   Review of Systems  The patient denies anorexia, fever, and weight loss.     Vital Signs:  Patient Profile:   50 Years Old Female Height:     66.5 inches (168.91 cm) Weight:      105 pounds (47.73 kg) BMI:     16.75  Pt. in pain?   no  Vitals Entered By: Michelle Nasuti (February 04, 2009 11:01 AM)              Is Patient Diabetic? No Nutritional Status BMI of < 19 = underweight  Does patient need  assistance? Functional Status Self care Ambulation Normal     Physical Exam  General:     alert, well-developed, well-nourished, and well-hydrated.   Head:     normocephalic and atraumatic.   Mouth:     pharynx pink and moist.  no thrush  Lungs:     normal breath sounds.   Skin:     several boils on face - no erythema surrounding them papular rash on face    Impression & Recommendations:  Problem # 1:  HIV DISEASE (ICD-042) We will obtain labs today and she will f/u in 2 weeks.  She was given her third Hep B vaccine today. Her updated medication list for this problem includes:    Retrovir 300 Mg Tabs (Zidovudine) .[name]... Take 1 tablet by mouth two times a day    Epivir 150 Mg Tabs (Lamivudine) .[name]... Take 1 tablet by mouth once a day    Sustiva 600 Mg Tabs (Efavirenz) .[name]... Take 1 tablet  by mouth at bedtime      Orders: T-CBC w/Diff ([id number]) T-CD4 [phone]) T-Comprehensive Metabolic Panel [phone]) T-HIV Viral Load ([id number]) Est. Patient Level III ([address])  Her updated medication list for this problem includes:    Retrovir 300 Mg Tabs (Zidovudine) .[name]... Take 1 tablet by mouth two times a day    Epivir 150 Mg Tabs (Lamivudine) .[name]... Take 1 tablet by mouth once a day    Sustiva 600 Mg Tabs (Efavirenz) .[name]... Take 1 tablet by mouth at bedtime    Doxycycline Hyclate 100 Mg Caps (Doxycycline hyclate) .[name]... Take 1 tablet by mouth two times a day   Problem # 2:  CARBUNCLE AND FURUNCLE OF FACE (ICD-680.0) doxy 100mg  two times a day for 10 days  Problem # 3:  FACIAL RASH (ICD-782.1)  Orders: Dermatology Referral ([facility])   Medications Added to Medication List This Visit: 1)  Prilosec 20 Mg Cpdr (Omeprazole) .... One tab daily 2)  Doxycycline Hyclate 100 Mg Caps (Doxycycline hyclate) .... Take 1 tablet by mouth two times a day  Other Orders: Hepatitis B Vaccine ADOLESCENT (2 dose) (24401) Admin 1st Vaccine (02725)        Medication Adherence:  02/04/2009   Adherence to medications reviewed with patient. Counseling to provide adequate adherence provided    Prevention For Positives: 02/04/2009   Safe sex practices discussed with patient. Condoms offered.                            Patient Instructions: 1)  Please schedule a follow-up appointment in 2 weeks.    Prescriptions: DOXYCYCLINE HYCLATE 100 MG CAPS (DOXYCYCLINE HYCLATE) Take 1 tablet by mouth two times a day  #20 x 0   Entered and Authorized by:   Yisroel Ramming MD   Signed by:   Yisroel Ramming MD on 02/04/2009   Method used:   Electronically to        [facility]* (retail)       [address] Felicita Gage [address] [place], [place]  [address]       Ph: [phone] or [phone]       Fax: [phone]   RxID:   [id number]    Hepatitis B Vaccine # 3    Vaccine Type: HepB Adolescent    Site: left deltoid    Mfr: Merck    Dose: 0.5 ml    Route: IM    Given by: Michelle Nasuti    Exp. Date: 06/17/2011    Lot #: [id number]    VIS given: 07/04/06 version given February 04, 2009.

## 2011-01-17 NOTE — Progress Notes (Signed)
Summary: Well-Woman Exam 12.8.10  Well-Woman Exam 12.8.10   Imported By: Percell Miller 02/11/2010 15:31:03  _____________________________________________________________________  External Attachment:    Type:   Image     Comment:   External Document

## 2011-01-17 NOTE — Miscellaneous (Signed)
Summary: Office Visit ([facility])    Vital Signs:  Patient profile:   50 year old female Menstrual status:  perimenopausal Weight:      92.7 pounds Temp:     98.2 degrees F oral Pulse rate:   80 / minute Pulse rhythm:   regular Resp:     20 per minute BP sitting:   118 / 84  (left arm)  Vitals Entered By: Sharen Heck RN (January 20, 2010 10:12 AM) CC: f/u 05, still c/o head congestion Is Patient Diabetic? No Pain Assessment Patient in pain? no       Does patient need assistance? Functional Status Self care Ambulation Normal   CC:  f/u 05 and still c/o head congestion.  History of Present Illness: Pt c/o sinus pressure and cough productive of yellow sputum.  No fever or chills.  Pt missed one dose of Sustiva because her mail order pharmacy did not send it on time. She would like to get PCS services because she does not have the energy to clean or do laundry.  she sometimes feels lightheaded when she exerts herself.  Current Problems (verified): 1)  Acute Sinusitis, Unspecified  (ICD-461.9) 2)  External Otitis  (ICD-380.10) 3)  Hyperlipidemia  (ICD-272.4) 4)  Weight Loss  (ICD-783.21) 5)  Preventive Health Care  (ICD-V70.0) 6)  Facial Rash  (ICD-782.1) 7)  Carbuncle and Furuncle of Face  (ICD-680.0) 8)  Peripheral Neuropathy  (ICD-356.9) 9)  Renal Insufficiency  (ICD-588.9) 10)  Cerebrovascular Accident, Hx of  (ICD-V12.50) 11)  HIV Disease  (ICD-042) 12)  Gerd  (ICD-530.81) 13)  Anemia-nos  (ICD-285.9)  Current Medications (verified): 1)  Norvasc 10 Mg Tabs (Amlodipine Besylate) .... Take 1 Tablet By Mouth Once A Day 2)  Sustiva 600 Mg Tabs (Efavirenz) .... Take 1 Tablet By Mouth At Bedtime 3)  Prilosec 20 Mg Cpdr (Omeprazole) .... One Tab Daily 4)  Combivir 150-300 Mg Tabs (Lamivudine-Zidovudine) .... Take 1 Tablet By Mouth Two Times A Day 5)  Nasonex 50 Mcg/act Susp (Mometasone Furoate) .... One Spray Two Times A Day 6)  Cortisporin 3.5-10000-1 Soln  (Neomycin-Polymyxin-Hc) .... One Drop Left Ear 4 Times A Day 7)  Ciprofloxacin Hcl 500 Mg Tabs (Ciprofloxacin Hcl) .... Take 1 Tablet By Mouth Two Times A Day  Allergies: 1)  ! Codeine 2)  ! Pcn   Review of Systems  The patient denies anorexia, fever, and weight loss.     Physical Exam  General:  alert, well-developed, well-nourished, and well-hydrated.   Head:  normocephalic and atraumatic.   Mouth:  pharynx pink and moist.   Lungs:  normal breath sounds.     Impression & Recommendations:  Problem # 1:  ACUTE SINUSITIS, UNSPECIFIED (ICD-461.9) Will treat with cipro for 10 days. Her updated medication list for this problem includes:    Nasonex 50 Mcg/act Susp (Mometasone furoate) ..... One spray two times a day    Ciprofloxacin Hcl 500 Mg Tabs (Ciprofloxacin hcl) .[name]... Take 1 tablet by mouth two times a day  Problem # 2:  FACIAL RASH (ICD-782.1) refer back to dermatology  Problem # 3:  HIV DISEASE (ICD-042) Pt.s most recent CD4ct was 457 and VL <48 .  Pt instructed to continue the current antiretroviral regimen.  Pt encouraged to take medication regularly and not miss doses.  Pt will f/u in 3 months for repeat blood work and will see me 2 weeks later. Will refer for PCS services.  Diagnostics Reviewed:  HIV: CDC-defined AIDS (  02/04/2009)   HIV-Western blot: Positive (04/03/2008)   CD4: 228 (07/30/2008)   WBC: 4.7 (01/06/2010)   Hgb: 11.1 (01/06/2010)   HCT: 33.8 (01/06/2010)   Platelets: 319 (01/06/2010) HIV-1 RNA: <48 copies/mL (01/06/2010)   HBSAg: NEG (04/16/2008)  Medications Added to Medication List This Visit: 1)  Ciprofloxacin Hcl 500 Mg Tabs (Ciprofloxacin hcl) .... Take 1 tablet by mouth two times a day  Prescriptions: CIPROFLOXACIN HCL 500 MG TABS (CIPROFLOXACIN HCL) Take 1 tablet by mouth two times a day  #20 x 0   Entered and Authorized by:   Yisroel Ramming MD   Signed by:   Yisroel Ramming MD on 01/20/2010   Method used:   Print then Give to Patient   RxID:    [id number]

## 2011-01-17 NOTE — Progress Notes (Signed)
Summary: meds called to new pharmacy  Phone Note Refill Request Message from:  Patient on November 24, 2009 4:01 PM  Refills Requested: Medication #1:  SUSTIVA 600 MG TABS Take 1 tablet by mouth at bedtime   Dosage confirmed as above?Dosage Confirmed   Brand Name Necessary? No   Supply Requested: 1 month  Medication #2:  COMBIVIR 150-300 MG TABS Take 1 tablet by mouth two times a day   Dosage confirmed as above?Dosage Confirmed   Brand Name Necessary? No   Supply Requested: 1 month  Medication #3:  NORVASC 10 MG TABS Take 1 tablet by mouth once a day   Dosage confirmed as above?Dosage Confirmed   Brand Name Necessary? No   Supply Requested: 1 month  Medication #4:  PRILOSEC 20 MG CPDR one tab daily   Dosage confirmed as above?Dosage Confirmed   Brand Name Necessary? No   Supply Requested: 1 month Patient changing pharmacy to [facility] as [facility] is closing.   Method Requested: Telephone to Pharmacy Next Appointment Scheduled: seen 11/24/09 for pap Initial call taken by: Sharen Heck RN,  November 24, 2009 4:07 PM    Prescriptions: COMBIVIR 150-300 MG TABS (LAMIVUDINE-ZIDOVUDINE) Take 1 tablet by mouth two times a day  #60 x 5   Entered by:   Sharen Heck RN   Authorized by:   Yisroel Ramming MD   Signed by:   Yisroel Ramming MD on 11/25/2009   Method used:   Telephoned to ...       [facility] (retail)       [address]       [place], [place]  [address]       Ph: [phone]       Fax: [phone]   RxID:   [id number] PRILOSEC 20 MG CPDR (OMEPRAZOLE) one tab daily  #30 x 5   Entered by:   Sharen Heck RN   Authorized by:   Yisroel Ramming MD   Signed by:   Yisroel Ramming MD on 11/25/2009   Method used:   Telephoned to ...       [facility] (retail)       [address]       [place], [place]  [address]       Ph: [phone]       Fax: [phone]   RxID:   [id number] SUSTIVA 600 MG TABS  (EFAVIRENZ) Take 1 tablet by mouth at bedtime  #30 x 5   Entered by:   Sharen Heck RN   Authorized by:   Yisroel Ramming MD   Signed by:   Yisroel Ramming MD on 11/25/2009   Method used:   Telephoned to ...       [facility] (retail)       [address]       [place], [place]  [address]       Ph: [phone]       Fax: [phone]   RxID:   [id number] NORVASC 10 MG TABS (AMLODIPINE BESYLATE) Take 1 tablet by mouth once a day  #30 x 5   Entered by:   Sharen Heck RN   Authorized by:   Yisroel Ramming MD   Signed by:   Yisroel Ramming MD on 11/25/2009   Method used:   Telephoned to .Marland KitchenMarland Kitchen  [facility] (retail)       [address]       [place], [place]  [address]       Ph: [phone]       Fax: [phone]   RxID:   [id number]

## 2011-01-17 NOTE — Letter (Signed)
Summary: [facility]  [facility]   Imported By: Florinda Marker 06/06/2010 16:45:20  _____________________________________________________________________  External Attachment:    Type:   Image     Comment:   External Document

## 2011-01-17 NOTE — Progress Notes (Signed)
Summary: med refill  Phone Note Refill Request Message from:  Fax from Pharmacy on April 14, 2009 2:53 PM  Refills Requested: Medication #1:  SUSTIVA 600 MG TABS Take 1 tablet by mouth at bedtime   Dosage confirmed as above?Dosage Confirmed   Brand Name Necessary? No   Supply Requested: 1 month  Method Requested: Fax to Local Pharmacy Initial call taken by: Sharen Heck RN,  April 14, 2009 2:53 PM        Prescriptions: SUSTIVA 600 MG TABS (EFAVIRENZ) Take 1 tablet by mouth at bedtime  #30 x 5   Entered by:   Sharen Heck RN   Authorized by:   Yisroel Ramming MD   Signed by:   Yisroel Ramming MD on 04/15/2009   Method used:   Printed then faxed to ...       [facility]* (retail)       [address] [place]icit[address] Gage [address]       [place], [place]  [address]       Ph: [phone] or [phone]       Fax: [phone]   RxID:   [id number]

## 2011-01-17 NOTE — Miscellaneous (Signed)
Summary: Orders Update LABS  Clinical Lists Changes  Problems: Added new problem of AIDS (ICD-042) Added new problem of ENCOUNTER FOR LONG-TERM USE OF OTHER MEDICATIONS (ICD-V58.69) Orders: Added new Test order of T-CBC w/Diff [phone]) - Signed Added new Test order of T-CD4SP [facility]) (CD4SP) - Signed Added new Test order of T-Comprehensive Metabolic Panel [phone]) - Signed Added new Test order of T-HIV Viral Load ([phone]) - Signed Added new Test order of T-RPR (Syphilis) [phone]) - Signed Added new Test order of T-Lipid Profile ([id number]) - Signed     Process Orders Check Orders Results:     [facility]: ABN not required for this insurance Order queued for requisitioning for [facility]: August 04, 2010 11:08 AM  Tests Sent for requisitioning (August 04, 2010 11:08 AM):     08/04/2010: [facility] -- T-CBC w/Diff [[id number]] (signed)     08/04/2010: [facility] -- T-Comprehensive Metabolic Panel [[id number]] (signed)     08/04/2010: [facility] -- T-HIV Viral Load [id number] (signed)     08/04/2010: [facility] -- T-RPR (Syphilis) [phone] (signed)     08/04/2010: [facility] -- T-Lipid Profile [phone] (signed)

## 2011-01-17 NOTE — Progress Notes (Signed)
Summary: norvasc refill  Phone Note Refill Request Message from:  Fax from Pharmacy on April 09, 2009 9:42 AM  Refills Requested: Medication #1:  NORVASC 10 MG TABS Take 1 tablet by mouth once a day   Dosage confirmed as above?Dosage Confirmed   Brand Name Necessary? No   Supply Requested: 1 month   Last Refilled: 03/11/2009  Method Requested: Fax to Local Pharmacy Next Appointment Scheduled: 05/13/09 Initial call taken by: Sharen Heck RN,  April 09, 2009 9:42 AM             Prescriptions: NORVASC 10 MG TABS (AMLODIPINE BESYLATE) Take 1 tablet by mouth once a day  #30 x 3   Entered by:   Sharen Heck RN   Authorized by:   Yisroel Ramming MD   Signed by:   Sharen Heck RN on 04/09/2009   Method used:   Printed then faxed to ...         RxID:   [id number]

## 2011-01-17 NOTE — Progress Notes (Signed)
Summary: change from Nasonex to Fluticasone  Phone Note Outgoing Call   Call placed by: Sharen Heck RN,  January 24, 2010 3:35 PM Call placed to: Patient Action Taken: Phone Call Completed Summary of Call: Patient called and advised per MD Fluticasone nasal spray okay to use with current meds and does not require a PA. Initial call taken by: Sharen Heck RN,  January 24, 2010 3:36 PM    New/Updated Medications: * FLUTICASONE NASAL SPRAY Use one spray twice daily Prescriptions: FLUTICASONE NASAL SPRAY Use one spray twice daily  #1 x 3   Entered by:   Sharen Heck RN   Authorized by:   Yisroel Ramming MD   Signed by:   Yisroel Ramming MD on 01/25/2010   Method used:   Telephoned to ...       [facility] [employer] (mail-order)       [address] [place], [place]  [address]       Ph: [phone]       Fax: [phone]   RxID:   [id number]

## 2011-01-17 NOTE — Letter (Signed)
Summary: [facility] Referral Form  [facility] Referral Form   Imported By: Percell Miller 04/09/2009 16:41:47  _____________________________________________________________________  External Attachment:    Type:   Image     Comment:   External Document

## 2011-01-18 ENCOUNTER — Encounter (INDEPENDENT_AMBULATORY_CARE_PROVIDER_SITE_OTHER): Payer: Self-pay | Admitting: *Deleted

## 2011-01-19 NOTE — Assessment & Plan Note (Signed)
Summary: F/U/VS   CC:  follow-up visit, lab results, and needs PAP.  History of Present Illness: patient is here to get results of her lab work.  She would like to stop smoking.  She smokes about two packs a day.  Preventive Screening-Counseling & Management  Alcohol-Tobacco     Alcohol drinks/day: 2     Alcohol type: beer     Smoking Status: current     Packs/Day: 0.25  Caffeine-Diet-Exercise     Caffeine use/day: 0     Does Patient Exercise: no  Hep-HIV-STD-Contraception     HIV Risk: no     Sun Exposure-Excessive: rarely  Safety-Violence-Falls     Seat Belt Use: 100      Drug Use:  former, crack cocaine, and clean since 2007.    Comments: pt. declined condoms   Updated Prior Medication List: NORVASC 10 MG TABS (AMLODIPINE BESYLATE) Take 1 tablet by mouth once a day SUSTIVA 600 MG TABS (EFAVIRENZ) Take 1 tablet by mouth at bedtime PRILOSEC 20 MG CPDR (OMEPRAZOLE) one tab daily COMBIVIR 150-300 MG TABS (LAMIVUDINE-ZIDOVUDINE) Take 1 tablet by mouth two times a day NASONEX 50 MCG/ACT SUSP (MOMETASONE FUROATE) one spray two times a day * FLUTICASONE NASAL SPRAY Use one spray twice daily ENSURE  LIQD (NUTRITIONAL SUPPLEMENTS) Drink one can 3 times a day. NICODERM CQ 21 MG/24HR PT24 (NICOTINE) apply one patch q d  Current Allergies (reviewed today): ! CODEINE ! PCN Past History:  Past Medical History: Last updated: 10/29/2008 Anemia-NOS GERD HIV disease Cerebrovascular accident, hx of  Review of Systems  The patient denies anorexia, fever, and weight loss.    Vital Signs:  Patient profile:   50 year old female Menstrual status:  perimenopausal Height:      66.5 inches (168.91 cm) Weight:      96.8 pounds (44.00 kg) BMI:     15.45 Temp:     98.2 degrees F (36.78 degrees C) oral Pulse rate:   73 / minute BP sitting:   156 / 88  (left arm)  Vitals Entered By: Wendall Mola CMA Duncan Dull) (January 04, 2011 11:02 AM) CC: follow-up visit,  lab  results, needs PAP Is Patient Diabetic? No Pain Assessment Patient in pain? no      Nutritional Status BMI of < 19 = underweight Nutritional Status Detail appetite "normal"  Have you ever been in a relationship where you felt threatened, hurt or afraid?No   Does patient need assistance? Functional Status Self care Ambulation Normal Comments no missed doses of meds per pt.   Physical Exam  General:  alert, well-developed, well-nourished, and well-hydrated.   Head:  normocephalic and atraumatic.   Mouth:  pharynx pink and moist.   Lungs:  normal breath sounds.     Impression & Recommendations:  Problem # 1:  HIV DISEASE (ICD-042) Pt.s most recent CD4ct was 510 and VL <20 .  Pt instructed to continue the current antiretroviral regimen.  Pt encouraged to take medication regularly and not miss doeses.  Pt will f/u in 3 months for repeat blood work and will see me 2 weeks later.  ostics Reviewed:  HIV: CDC-defined AIDS (02/04/2009)   HIV-Western blot: Positive (04/03/2008)   CD4: 510 (12/21/2010)   WBC: 4.2 (12/20/2010)   Hgb: 11.3 (12/20/2010)   HCT: 33.3 (12/20/2010)   Platelets: 327 (12/20/2010) HIV-1 RNA: <20 copies/mL (12/20/2010)   HBSAg: NEG (04/16/2008)  Problem # 2:  TOBACCO USER (ICD-305.1) trial of nicoderm Her updated medication list  for this problem includes:    Nicoderm Cq 21 Mg/24hr Pt24 (Nicotine) .[name]... Apply one patch q d  Medications Added to Medication List This Visit: 1)  Nicoderm Cq 21 Mg/24hr Pt24 (Nicotine) .... Apply one patch q d  Other Orders: Est. Patient Level III (016[address]) Future Orders: T-CD4SP ([facility]) (CD4SP) ... 04/04/2011 T-HIV Viral Load [phone]) ... 04/04/2011 T-Comprehensive Metabolic Panel [phone]) ... 04/04/2011 T-CBC w/Diff ([id number]) ... 04/04/2011  Patient Instructions: 1)  Please schedule a follow-up appointment in 3 months, 2 weeks after labs. 2)  Schedule PAP in PAP clinc  Prescriptions: NICODERM CQ 21 MG/24HR  PT24 (NICOTINE) apply one patch q d  #30 x 2   Entered and Authorized by:   Yisroel Ramming MD   Signed by:   Yisroel Ramming MD on 01/04/2011   Method used:   Print then Give to Patient   RxID:   [id number]

## 2011-01-20 NOTE — Medication Information (Signed)
Summary: Tax adviser   Imported By: Florinda Marker 06/07/2010 15:56:15  _____________________________________________________________________  External Attachment:    Type:   Image     Comment:   External Document

## 2011-01-20 NOTE — Letter (Signed)
Summary: External Correspondence  External Correspondence   Imported By: Paula Libra 04/06/2009 13:26:30  _____________________________________________________________________  External Attachment:    Type:   Image     Comment:   External Document

## 2011-01-25 NOTE — Miscellaneous (Signed)
Summary: Problem list updated  Clinical Lists Changes  Problems: Added new problem of SCREENING FOR MALIGNANT NEOPLASM OF THE CERVIX (ICD-V76.2)

## 2011-02-03 ENCOUNTER — Other Ambulatory Visit: Payer: Self-pay | Admitting: Infectious Diseases

## 2011-02-03 ENCOUNTER — Ambulatory Visit (INDEPENDENT_AMBULATORY_CARE_PROVIDER_SITE_OTHER): Payer: Medicaid Other

## 2011-02-03 ENCOUNTER — Encounter: Payer: Self-pay | Admitting: Infectious Diseases

## 2011-02-03 DIAGNOSIS — Z124 Encounter for screening for malignant neoplasm of cervix: Secondary | ICD-10-CM

## 2011-02-08 NOTE — Assessment & Plan Note (Addendum)
Summary: req. appt. to discuss smoking cessation, pap smear today   Vitals Entered By: Jennet Maduro RN (February 03, 2011 10:12 AM) CC: Requesting appt. to talk with provider about medication to help her stop smoking.  PAP smear today.  Pt. given condoms.  Pt. given educational materials re:  HIV and women, nutrition, diet, exercise, self-esteem and BSE   CC:  Requesting appt. to talk with provider about medication to help her stop smoking.  PAP smear today.  Pt. given condoms.  Pt. given educational materials re:  HIV and women, nutrition, diet, exercise, and self-esteem and BSE.  Allergies: 1)  ! Codeine 2)  ! Pcn   Other Orders: T-PAP [facility]) [phone]) Est. Patient Level I (98119)  Patient Instructions: 1)  Please schedule pt. to see B. Sundra Aland to discuss stopping smoking. 2)  PAP results will be ready in about a week.  I will mail them to you. 3)  Thank you for coming to the Center for your care.   Orders Added: 1)  T-PAP [facility]) [88142] 2)  Est. Patient Level I [[address]782]

## 2011-02-09 ENCOUNTER — Encounter (INDEPENDENT_AMBULATORY_CARE_PROVIDER_SITE_OTHER): Payer: Self-pay | Admitting: *Deleted

## 2011-02-10 ENCOUNTER — Encounter: Payer: Self-pay | Admitting: Adult Health

## 2011-02-10 ENCOUNTER — Ambulatory Visit: Payer: Self-pay | Admitting: Adult Health

## 2011-02-10 ENCOUNTER — Ambulatory Visit (INDEPENDENT_AMBULATORY_CARE_PROVIDER_SITE_OTHER): Payer: Medicaid Other | Admitting: Adult Health

## 2011-02-10 DIAGNOSIS — F17200 Nicotine dependence, unspecified, uncomplicated: Secondary | ICD-10-CM

## 2011-02-10 DIAGNOSIS — L723 Sebaceous cyst: Secondary | ICD-10-CM | POA: Insufficient documentation

## 2011-02-14 NOTE — Letter (Signed)
Summary: Results Follow-up Letter  [facility]  [address]   [place], [place] [address]   Phone: [phone]  Fax: [phone]          February 10, 2011  [address] [place], [place]  [address]  [place]  Dear Ms. DELANEY,   The following are the results of your recent test(s):  Test     Result     Pap Smear    Normal_XXX___  Not Normal_____       Comments:  Everything was normal.  I will see you in one year for your next PAP smear.  Thank you for coming to the Center for your care.  Sincerely,   Jennet Maduro [facility]

## 2011-02-14 NOTE — Assessment & Plan Note (Signed)
Summary: o/v discuss stopping smoking/vs   Vital Signs:  Patient profile:   50 year old female Menstrual status:  perimenopausal Height:      66.5 inches Weight:      97.8 pounds BMI:     15.61 Temp:     98.2 degrees F oral Pulse rate:   108 / minute BP sitting:   147 / 93  (right arm)  Vitals Entered By: Alesia Morin CMA (February 10, 2011 3:39 PM) CC: Pt in today to have doctor look at growth on her chin, she says that it itches and has been for about 3 day. Pt wants to talk about starting Chantix. Is Patient Diabetic? No Pain Assessment Patient in pain? no      Nutritional Status BMI of < 19 = underweight Nutritional Status Detail appetite "not good"  Have you ever been in a relationship where you felt threatened, hurt or afraid?No   Does patient need assistance? Functional Status Self care Ambulation Normal Comments no missed doses   CC:  Pt in today to have doctor look at growth on her chin and she says that it itches and has been for about 3 day. Pt wants to talk about starting Chantix.[name]  History of Present Illness: Long-term hx of smoking since 43 yoa, wants to stop now.   States Nicoderm patches not effective for her.  "Tired of cigarettes."  Also c/o "painful bump" on face that continued to developed over past month.  Currently smokes 2 packs cigarettes per day.  Preventive Screening-Counseling & Management  Alcohol-Tobacco     Alcohol drinks/day: 2     Alcohol type: beer     Smoking Status: current     Packs/Day: 0.25  Caffeine-Diet-Exercise     Caffeine use/day: 0     Does Patient Exercise: no  Hep-HIV-STD-Contraception     HIV Risk: no     Sun Exposure-Excessive: rarely  Safety-Violence-Falls     Seat Belt Use: 100      Sexual History:  no.        Drug Use:  former, crack cocaine, and clean since 2007.        Blood Transfusions:  no.        Travel History:  no.    Comments: pt refused condoms  Allergies: 1)  ! Codeine 2)  !  Pcn  Social History: Sexual History:  no Blood Transfusions:  no Travel History:  no  Review of Systems       The patient complains of anorexia, weight loss, prolonged cough, and suspicious skin lesions.  The patient denies fever, weight gain, vision loss, decreased hearing, hoarseness, chest pain, syncope, dyspnea on exertion, peripheral edema, headaches, hemoptysis, abdominal pain, melena, hematochezia, severe indigestion/heartburn, hematuria, incontinence, genital sores, muscle weakness, transient blindness, difficulty walking, depression, unusual weight change, abnormal bleeding, enlarged lymph nodes, angioedema, breast masses, and testicular masses.    Physical Exam  General:  alert, well-hydrated, underweight appearing, and cachetic.   Head:  normocephalic and atraumatic.   Eyes:  vision grossly intact, pupils equal, pupils round, and pupils reactive to light.   Lungs:  normal respiratory effort and R base crackles.   Heart:  normal rate and regular rhythm.   Skin:  Multiple cystic lesions on face with one inflamed along left jaw line Psych:  Cognition and judgment appear intact. Alert and cooperative with normal attention span and concentration. 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  Appears to be secondarily inflamed.  Will give 10-day course doxycyline to manage any bacterial infections.  If symptoms persist, should consider imaging to determine if there is mandibular involvement.  Instructed to call clinic in 1 week if symptoms don't resolve.  Problem # 2:  TOBACCO DEPENDENCE (ICD-305.1) Spent > 20 minutes discussing the various treatment options open to her, drug effects, SE's, and ADR's.  Emphasized need for ongoing support during this process to which she agreed.  She was informed the second step would be to contact the [facility] program to set up counselling for  smoking cessation, once she has started this we will have her RTC in 1 month to f/u on her progress and at that tme discuss treatment alternatives as reviewed today.  She verbally acknowledged this and agreed with plan.  [facility] program contact information provided. The following medications were removed from the medication list:    Nicoderm Cq 21 Mg/24hr Pt24 (Nicotine) .[name]... Apply one patch q d  Medications Added to Medication List This Visit: 1)  Doxycycline Hyclate 100 Mg Caps (Doxycycline hyclate) .[name].. 1 capsule by mouth every 12 hours for 10 days  Other Orders: Est. Patient Level III ([address]109) Prescriptions: DOXYCYCLINE HYCLATE 100 MG CAPS (DOXYCYCLINE HYCLATE) 1 capsule by mouth every 12 hours for 10 days  #20 x 0   Entered and Authorized by:   Talmadge Chad NP   Signed by:   Talmadge Chad NP on 02/10/2011   Method used:   Electronically to        [facility] [id number] (retail)       [address] [place], [place]  [address]       Ph: [phone]       Fax: [phone]   RxID:   [id number]    Orders Added: 1)  Est. Patient Level III [44010]

## 2011-02-27 LAB — T-HELPER CELL (CD4) - (RCID CLINIC ONLY)
CD4 % Helper T Cell: 25 % — ABNORMAL LOW (ref 33–55)
CD4 T Cell Abs: 510 uL (ref 400–2700)

## 2011-03-02 LAB — T-HELPER CELL (CD4) - (RCID CLINIC ONLY)
CD4 % Helper T Cell: 27 % — ABNORMAL LOW (ref 33–55)
CD4 T Cell Abs: 430 uL (ref 400–2700)

## 2011-03-10 ENCOUNTER — Ambulatory Visit: Payer: Medicaid Other | Admitting: Adult Health

## 2011-03-20 LAB — CULTURE, ROUTINE-ABSCESS

## 2011-03-31 ENCOUNTER — Other Ambulatory Visit (INDEPENDENT_AMBULATORY_CARE_PROVIDER_SITE_OTHER): Payer: Medicaid Other

## 2011-03-31 DIAGNOSIS — B20 Human immunodeficiency virus [HIV] disease: Secondary | ICD-10-CM

## 2011-03-31 LAB — CBC WITH DIFFERENTIAL/PLATELET
Basophils Absolute: 0 10*3/uL (ref 0.0–0.1)
Basophils Relative: 1 % (ref 0–1)
Eosinophils Absolute: 0.1 10*3/uL (ref 0.0–0.7)
Eosinophils Relative: 1 % (ref 0–5)
HCT: 32.6 % — ABNORMAL LOW (ref 36.0–46.0)
Hemoglobin: 11.3 g/dL — ABNORMAL LOW (ref 12.0–15.0)
Lymphocytes Relative: 42 % (ref 12–46)
Lymphs Abs: 2.4 10*3/uL (ref 0.7–4.0)
MCH: 39.9 pg — ABNORMAL HIGH (ref 26.0–34.0)
MCHC: 34.7 g/dL (ref 30.0–36.0)
MCV: 115.2 fL — ABNORMAL HIGH (ref 78.0–100.0)
Monocytes Absolute: 0.3 10*3/uL (ref 0.1–1.0)
Monocytes Relative: 5 % (ref 3–12)
Neutro Abs: 2.9 10*3/uL (ref 1.7–7.7)
Neutrophils Relative %: 51 % (ref 43–77)
Platelets: 298 10*3/uL (ref 150–400)
RBC: 2.83 MIL/uL — ABNORMAL LOW (ref 3.87–5.11)
RDW: 14 % (ref 11.5–15.5)
WBC: 5.7 10*3/uL (ref 4.0–10.5)

## 2011-03-31 LAB — T-HELPER CELL (CD4) - (RCID CLINIC ONLY)
CD4 % Helper T Cell: 27 % — ABNORMAL LOW (ref 33–55)
CD4 T Cell Abs: 710 uL (ref 400–2700)

## 2011-04-01 LAB — COMPLETE METABOLIC PANEL WITH GFR
ALT: <8 U/L (ref 0–35)
AST: 15 U/L (ref 0–37)
Albumin: 4.4 g/dL (ref 3.5–5.2)
Alkaline Phosphatase: 61 U/L (ref 39–117)
BUN: 19 mg/dL (ref 6–23)
CO2: 25 mEq/L (ref 19–32)
Calcium: 9.4 mg/dL (ref 8.4–10.5)
Chloride: 106 mEq/L (ref 96–112)
Creat: 0.92 mg/dL (ref 0.40–1.20)
GFR, Est African American: >60 mL/min (ref >60)
GFR, Est Non African American: >60 mL/min (ref >60)
Glucose, Bld: 90 mg/dL (ref 70–99)
Potassium: 4.1 mEq/L (ref 3.5–5.3)
Sodium: 142 mEq/L (ref 135–145)
Total Bilirubin: 0.3 mg/dL (ref 0.3–1.2)
Total Protein: 7 g/dL (ref 6.0–8.3)

## 2011-04-01 LAB — HIV-1 RNA QUANT-NO REFLEX-BLD
HIV 1 RNA Quant: <20 copies/mL (ref <20)
HIV-1 RNA Quant, Log: <1.3 {Log} (ref <1.30)

## 2011-04-04 LAB — POCT URINALYSIS DIP (DEVICE)
Glucose, UA: NEGATIVE mg/dL
Hgb urine dipstick: NEGATIVE
Ketones, ur: 15 mg/dL — AB
Nitrite: NEGATIVE
Protein, ur: 30 mg/dL — AB
Specific Gravity, Urine: 1.02 (ref 1.005–1.030)
Urobilinogen, UA: 0.2 mg/dL (ref 0.0–1.0)
pH: 5.5 (ref 5.0–8.0)

## 2011-04-17 ENCOUNTER — Ambulatory Visit: Payer: Medicaid Other | Admitting: Adult Health

## 2011-04-25 ENCOUNTER — Ambulatory Visit: Payer: Medicaid Other | Admitting: Adult Health

## 2011-06-22 ENCOUNTER — Other Ambulatory Visit (INDEPENDENT_AMBULATORY_CARE_PROVIDER_SITE_OTHER): Payer: Medicaid Other

## 2011-06-22 DIAGNOSIS — Z113 Encounter for screening for infections with a predominantly sexual mode of transmission: Secondary | ICD-10-CM

## 2011-06-22 DIAGNOSIS — B20 Human immunodeficiency virus [HIV] disease: Secondary | ICD-10-CM

## 2011-06-22 DIAGNOSIS — Z79899 Other long term (current) drug therapy: Secondary | ICD-10-CM

## 2011-06-22 LAB — LIPID PANEL
Cholesterol: 226 mg/dL — ABNORMAL HIGH (ref 0–200)
HDL: 67 mg/dL (ref >39)
LDL Cholesterol: 143 mg/dL — ABNORMAL HIGH (ref 0–99)
Total CHOL/HDL Ratio: 3.4 Ratio
Triglycerides: 82 mg/dL (ref <150)
VLDL: 16 mg/dL (ref 0–40)

## 2011-06-23 LAB — CBC WITH DIFFERENTIAL/PLATELET
Basophils Absolute: 0 10*3/uL (ref 0.0–0.1)
Basophils Relative: 1 % (ref 0–1)
Eosinophils Absolute: 0 10*3/uL (ref 0.0–0.7)
Eosinophils Relative: 1 % (ref 0–5)
HCT: 33 % — ABNORMAL LOW (ref 36.0–46.0)
Hemoglobin: 11.4 g/dL — ABNORMAL LOW (ref 12.0–15.0)
Lymphocytes Relative: 43 % (ref 12–46)
Lymphs Abs: 1.9 10*3/uL (ref 0.7–4.0)
MCH: 39.7 pg — ABNORMAL HIGH (ref 26.0–34.0)
MCHC: 34.5 g/dL (ref 30.0–36.0)
MCV: 115 fL — ABNORMAL HIGH (ref 78.0–100.0)
Monocytes Absolute: 0.4 10*3/uL (ref 0.1–1.0)
Monocytes Relative: 8 % (ref 3–12)
Neutro Abs: 2 10*3/uL (ref 1.7–7.7)
Neutrophils Relative %: 47 % (ref 43–77)
Platelets: 366 10*3/uL (ref 150–400)
RBC: 2.87 MIL/uL — ABNORMAL LOW (ref 3.87–5.11)
RDW: 13.7 % (ref 11.5–15.5)
WBC: 4.3 10*3/uL (ref 4.0–10.5)

## 2011-06-23 LAB — COMPLETE METABOLIC PANEL WITH GFR
ALT: 14 U/L (ref 0–35)
AST: 26 U/L (ref 0–37)
Albumin: 4.8 g/dL (ref 3.5–5.2)
Alkaline Phosphatase: 72 U/L (ref 39–117)
BUN: 22 mg/dL (ref 6–23)
CO2: 23 mEq/L (ref 19–32)
Calcium: 10 mg/dL (ref 8.4–10.5)
Chloride: 105 mEq/L (ref 96–112)
Creat: 0.92 mg/dL (ref 0.50–1.10)
GFR, Est African American: >60 mL/min (ref >60)
GFR, Est Non African American: >60 mL/min (ref >60)
Glucose, Bld: 96 mg/dL (ref 70–99)
Potassium: 4.8 mEq/L (ref 3.5–5.3)
Sodium: 137 mEq/L (ref 135–145)
Total Bilirubin: 0.3 mg/dL (ref 0.3–1.2)
Total Protein: 7.9 g/dL (ref 6.0–8.3)

## 2011-06-23 LAB — GC/CHLAMYDIA PROBE AMP, URINE
Chlamydia, Swab/Urine, PCR: NEGATIVE
GC Probe Amp, Urine: NEGATIVE

## 2011-06-23 LAB — T-HELPER CELL (CD4) - (RCID CLINIC ONLY)
CD4 % Helper T Cell: 28 % — ABNORMAL LOW (ref 33–55)
CD4 T Cell Abs: 510 uL (ref 400–2700)

## 2011-06-23 LAB — RPR

## 2011-06-23 LAB — HIV-1 RNA QUANT-NO REFLEX-BLD
HIV 1 RNA Quant: <20 copies/mL (ref <20)
HIV-1 RNA Quant, Log: <1.3 {Log} (ref <1.30)

## 2011-06-28 ENCOUNTER — Other Ambulatory Visit: Payer: Self-pay | Admitting: *Deleted

## 2011-06-28 DIAGNOSIS — I10 Essential (primary) hypertension: Secondary | ICD-10-CM

## 2011-06-28 DIAGNOSIS — K219 Gastro-esophageal reflux disease without esophagitis: Secondary | ICD-10-CM

## 2011-06-28 DIAGNOSIS — B20 Human immunodeficiency virus [HIV] disease: Secondary | ICD-10-CM

## 2011-06-28 MED ORDER — AMLODIPINE BESYLATE 10 MG PO TABS: 10.0000 mg | ORAL_TABLET | Freq: Every day | ORAL | Status: DC

## 2011-06-28 MED ORDER — EFAVIRENZ 600 MG PO TABS: 600.0000 mg | ORAL_TABLET | Freq: Every day | ORAL | Status: DC

## 2011-06-28 MED ORDER — LAMIVUDINE-ZIDOVUDINE 150-300 MG PO TABS: 1.0000 | ORAL_TABLET | Freq: Two times a day (BID) | ORAL | Status: DC

## 2011-06-28 MED ORDER — OMEPRAZOLE 20 MG PO CPDR: 20.0000 mg | DELAYED_RELEASE_CAPSULE | Freq: Every day | ORAL | Status: DC

## 2011-07-06 ENCOUNTER — Encounter: Payer: Self-pay | Admitting: Adult Health

## 2011-07-06 ENCOUNTER — Ambulatory Visit (INDEPENDENT_AMBULATORY_CARE_PROVIDER_SITE_OTHER): Payer: Medicaid Other | Admitting: Adult Health

## 2011-07-06 DIAGNOSIS — E785 Hyperlipidemia, unspecified: Secondary | ICD-10-CM | POA: Insufficient documentation

## 2011-07-06 DIAGNOSIS — B20 Human immunodeficiency virus [HIV] disease: Secondary | ICD-10-CM

## 2011-07-06 DIAGNOSIS — R634 Abnormal weight loss: Secondary | ICD-10-CM

## 2011-07-06 DIAGNOSIS — F17200 Nicotine dependence, unspecified, uncomplicated: Secondary | ICD-10-CM

## 2011-07-06 DIAGNOSIS — Z124 Encounter for screening for malignant neoplasm of cervix: Secondary | ICD-10-CM | POA: Insufficient documentation

## 2011-07-06 DIAGNOSIS — Z0000 Encounter for general adult medical examination without abnormal findings: Secondary | ICD-10-CM | POA: Insufficient documentation

## 2011-07-06 MED ORDER — ENSURE PO LIQD: 1.0000 | Freq: Three times a day (TID) | ORAL | Status: DC

## 2011-07-06 MED ORDER — EFAVIRENZ-EMTRICITAB-TENOFOVIR 600-200-300 MG PO TABS: 1.0000 | ORAL_TABLET | Freq: Every day | ORAL | Status: DC

## 2011-07-06 MED ORDER — PRAVASTATIN SODIUM 20 MG PO TABS: 20.0000 mg | ORAL_TABLET | Freq: Every day | ORAL | Status: DC

## 2011-07-06 NOTE — Progress Notes (Signed)
Subjective:    Patient ID: Janice Williams, female    DOB: 1961-07-27, 50 y.o.   MRN: [id number]  HPI Presents to clinic for followup. Has not been seen in clinic for almost one year. Has had multiple issues with family members. However, she has remained adherent to her medications with good tolerance and no reported complications. She claims that she has been recently recovering from a URI and still has a slight cough, but denies fever, chills, shortness of breath, or dyspnea on exertion. She states she has otherwise been in relatively good health since last being seen in clinic. Denies any hospitalizations or seeking medical care or other problems since last being seen in clinic.   Review of Systems  Constitutional: Negative for fever, chills, diaphoresis, activity change, appetite change, fatigue and unexpected weight change.  HENT: Negative for hearing loss, ear pain, nosebleeds, congestion, sore throat, facial swelling, rhinorrhea, sneezing, drooling, mouth sores, trouble swallowing, neck pain, neck stiffness, dental problem, voice change, postnasal drip, sinus pressure, tinnitus and ear discharge.   Eyes: Negative for photophobia, pain, discharge, redness, itching and visual disturbance.  Respiratory: Negative for apnea, cough, choking, chest tightness, shortness of breath, wheezing and stridor.   Cardiovascular: Negative for chest pain, palpitations and leg swelling.  Gastrointestinal: Negative for nausea, vomiting, abdominal pain, diarrhea, constipation, blood in stool, abdominal distention, anal bleeding and rectal pain.  Genitourinary: Negative for dysuria, urgency, frequency, hematuria, flank pain, decreased urine volume, vaginal bleeding, vaginal discharge, enuresis, difficulty urinating, genital sores, vaginal pain, menstrual problem, pelvic pain and dyspareunia.  Musculoskeletal: Negative for myalgias, back pain, joint swelling, arthralgias and gait problem.  Skin: Negative for color  change, pallor, rash and wound.  Neurological: Negative for dizziness, tremors, seizures, syncope, facial asymmetry, speech difficulty, weakness, light-headedness, numbness and headaches.  Hematological: Negative for adenopathy. Does not bruise/bleed easily.  Psychiatric/Behavioral: Negative for hallucinations, behavioral problems, confusion, dysphoric mood, decreased concentration and agitation.       Objective:   Physical Exam  Constitutional: She is oriented to person, place, and time. She appears well-developed. No distress.       Underweight-appearing  HENT:  Head: Normocephalic and atraumatic.  Right Ear: External ear normal.  Left Ear: External ear normal.  Nose: Nose normal.  Mouth/Throat: Oropharynx is clear and moist.       Multiple teeth missing, with remaining teeth in poor repair  Eyes: Conjunctivae and EOM are normal. Pupils are equal, round, and reactive to light.  Neck: Normal range of motion. Neck supple.  Cardiovascular: Normal rate, regular rhythm, normal heart sounds and intact distal pulses.   Pulmonary/Chest: Effort normal. No respiratory distress. She has no wheezes. She has no rales. She exhibits no tenderness.       Musical rhonchi heard in bilateral anterior and posterior bases  Abdominal: Soft. Bowel sounds are normal. She exhibits no distension and no mass. There is no tenderness. There is no rebound and no guarding.  Musculoskeletal: Normal range of motion. She exhibits no edema and no tenderness.  Neurological: She is alert and oriented to person, place, and time. She has normal reflexes. No cranial nerve deficit. She exhibits normal muscle tone. Coordination normal.  Skin: Skin is warm and dry. No rash noted. She is not diaphoretic. No erythema. No pallor.       Facial acne noted  Psychiatric: She has a normal mood and affect. Her behavior is normal. Judgment and thought content normal.          Assessment &  Plan:  1. HIV. Labs obtained 06/22/2011.  Show a CD4 count of 510 at 28% with a viral load of less than 20 copies per mL. From an HIV prospective. She is clinically stable on her current regimen. However she demonstrates chronic macrocytic, hyperchromic anemia, which can be seen in long-term use with AZT therapy. Additionally, she is also, demonstrating some elevations in cholesterol, but she does not demonstrate any signs of renal insufficiency, at present. Therefore, we will modify her regimen by discontinuing Combivir and Sustiva and beginning Atripla 1 tablet by mouth each bedtime. Treatment regimen, side effects, potential drug reactions, and toxicities were discussed in detail. We will have her return in 4 weeks for repeat labs, with a followup in 6 weeks. She verbally acknowledged all this information and agreed with plan of care.  2. Dyslipidemia. Total cholesterol, was 226 mg/dL with an LDL cholesterol of 143 mg/dL. In addition to changing her antiretroviral therapies, we will begin pravastatin 20 mg by mouth daily.  3. Underweight. Resume tension or one can 3 times a day between meals.  4. Macrocytic, Hyperchromic Anemia .most likely associated with long-term, AZT therapy. We will continue to monitor this to see whether or not this improves.  Verbally acknowledged all information was provided to her and agreed with plan of care.

## 2011-07-11 ENCOUNTER — Telehealth: Payer: Self-pay | Admitting: *Deleted

## 2011-07-11 NOTE — Telephone Encounter (Signed)
States she just got her [facility]s [facility] & wants to make sure she has the right instructions. I reviewed them with her. She already took a combivir today & will start the [place] tomorrow

## 2011-07-24 ENCOUNTER — Telehealth: Payer: Self-pay | Admitting: *Deleted

## 2011-07-24 NOTE — Telephone Encounter (Signed)
Patient having trouble getting Ensure.  [facility] is having a shortage.  Spoke with Aundra Millet and she advised her to call Raynelle Fanning every Monday at [facility] and when they get some in they will let her know.  Tried [facility] at [facility] and they only supply one case and can not bill Medicaid for it. Wendall Mola CMA

## 2011-08-03 ENCOUNTER — Other Ambulatory Visit: Payer: Self-pay | Admitting: Infectious Diseases

## 2011-08-03 ENCOUNTER — Other Ambulatory Visit: Payer: Medicaid Other

## 2011-08-03 DIAGNOSIS — B20 Human immunodeficiency virus [HIV] disease: Secondary | ICD-10-CM

## 2011-08-03 DIAGNOSIS — E785 Hyperlipidemia, unspecified: Secondary | ICD-10-CM

## 2011-08-03 LAB — COMPLETE METABOLIC PANEL WITH GFR
ALT: 13 U/L (ref 0–35)
AST: 24 U/L (ref 0–37)
Albumin: 4.5 g/dL (ref 3.5–5.2)
Alkaline Phosphatase: 64 U/L (ref 39–117)
BUN: 31 mg/dL — ABNORMAL HIGH (ref 6–23)
CO2: 28 mEq/L (ref 19–32)
Calcium: 10.1 mg/dL (ref 8.4–10.5)
Chloride: 104 mEq/L (ref 96–112)
Creat: 1.07 mg/dL (ref 0.50–1.10)
GFR, Est African American: >60 mL/min (ref >60)
GFR, Est Non African American: 54 mL/min — ABNORMAL LOW (ref >60)
Glucose, Bld: 88 mg/dL (ref 70–99)
Potassium: 5.6 mEq/L — ABNORMAL HIGH (ref 3.5–5.3)
Sodium: 139 mEq/L (ref 135–145)
Total Bilirubin: 0.4 mg/dL (ref 0.3–1.2)
Total Protein: 8.1 g/dL (ref 6.0–8.3)

## 2011-08-03 LAB — CBC WITH DIFFERENTIAL/PLATELET
Basophils Absolute: 0 10*3/uL (ref 0.0–0.1)
Basophils Relative: 1 % (ref 0–1)
Eosinophils Absolute: 0.1 10*3/uL (ref 0.0–0.7)
Eosinophils Relative: 1 % (ref 0–5)
HCT: 34.9 % — ABNORMAL LOW (ref 36.0–46.0)
Hemoglobin: 12 g/dL (ref 12.0–15.0)
Lymphocytes Relative: 35 % (ref 12–46)
Lymphs Abs: 1.9 10*3/uL (ref 0.7–4.0)
MCH: 39.3 pg — ABNORMAL HIGH (ref 26.0–34.0)
MCHC: 34.4 g/dL (ref 30.0–36.0)
MCV: 114.4 fL — ABNORMAL HIGH (ref 78.0–100.0)
Monocytes Absolute: 0.4 10*3/uL (ref 0.1–1.0)
Monocytes Relative: 7 % (ref 3–12)
Neutro Abs: 3.1 10*3/uL (ref 1.7–7.7)
Neutrophils Relative %: 56 % (ref 43–77)
Platelets: 312 10*3/uL (ref 150–400)
RBC: 3.05 MIL/uL — ABNORMAL LOW (ref 3.87–5.11)
RDW: 13.1 % (ref 11.5–15.5)
WBC: 5.5 10*3/uL (ref 4.0–10.5)

## 2011-08-03 LAB — LIPID PANEL
Cholesterol: 236 mg/dL — ABNORMAL HIGH (ref 0–200)
HDL: 85 mg/dL (ref >39)
LDL Cholesterol: 130 mg/dL — ABNORMAL HIGH (ref 0–99)
Total CHOL/HDL Ratio: 2.8 Ratio
Triglycerides: 103 mg/dL (ref <150)
VLDL: 21 mg/dL (ref 0–40)

## 2011-08-04 LAB — T-HELPER CELL (CD4) - (RCID CLINIC ONLY)
CD4 % Helper T Cell: 27 % — ABNORMAL LOW (ref 33–55)
CD4 T Cell Abs: 560 uL (ref 400–2700)

## 2011-08-07 LAB — HIV-1 RNA QUANT-NO REFLEX-BLD
HIV 1 RNA Quant: <20 copies/mL (ref <20)
HIV-1 RNA Quant, Log: <1.3 {Log} (ref <1.30)

## 2011-08-17 ENCOUNTER — Encounter: Payer: Self-pay | Admitting: Adult Health

## 2011-08-17 ENCOUNTER — Ambulatory Visit (INDEPENDENT_AMBULATORY_CARE_PROVIDER_SITE_OTHER): Payer: Medicaid Other | Admitting: Adult Health

## 2011-08-17 VITALS — BP 114/73 | HR 78 | Temp 98.1°F | Wt 92.4 lb

## 2011-08-17 DIAGNOSIS — B20 Human immunodeficiency virus [HIV] disease: Secondary | ICD-10-CM

## 2011-08-17 DIAGNOSIS — Z0000 Encounter for general adult medical examination without abnormal findings: Secondary | ICD-10-CM

## 2011-08-17 DIAGNOSIS — Z23 Encounter for immunization: Secondary | ICD-10-CM

## 2011-08-17 DIAGNOSIS — E785 Hyperlipidemia, unspecified: Secondary | ICD-10-CM

## 2011-09-08 LAB — POCT URINALYSIS DIP (DEVICE)
Bilirubin Urine: NEGATIVE
Glucose, UA: NEGATIVE
Ketones, ur: NEGATIVE
Nitrite: NEGATIVE
Operator id: 247071
Protein, ur: NEGATIVE
Specific Gravity, Urine: 1.015
Urobilinogen, UA: 0.2
pH: 7

## 2011-09-08 LAB — URINE CULTURE: Colony Count: 10000

## 2011-09-11 LAB — URINALYSIS, ROUTINE W REFLEX MICROSCOPIC
Bilirubin Urine: NEGATIVE
Bilirubin Urine: NEGATIVE
Glucose, UA: NEGATIVE
Glucose, UA: NEGATIVE
Ketones, ur: NEGATIVE
Ketones, ur: NEGATIVE
Nitrite: NEGATIVE
Nitrite: NEGATIVE
Protein, ur: NEGATIVE
Protein, ur: NEGATIVE
Specific Gravity, Urine: 1.013
Specific Gravity, Urine: 1.016
Urobilinogen, UA: 0.2
Urobilinogen, UA: 0.2
pH: 6.5
pH: 7.5

## 2011-09-11 LAB — URINE MICROSCOPIC-ADD ON

## 2011-09-11 LAB — POCT CARDIAC MARKERS
CKMB, poc: 1.6
Myoglobin, poc: 85
Operator id: 146091
Troponin i, poc: <0.05

## 2011-09-11 LAB — DIFFERENTIAL
Basophils Absolute: 0
Basophils Absolute: 0
Basophils Relative: 0
Basophils Relative: 1
Eosinophils Absolute: 0.1
Eosinophils Absolute: 0.1
Eosinophils Relative: 1
Eosinophils Relative: 2
Lymphocytes Relative: 23
Lymphocytes Relative: 24
Lymphs Abs: 2.2
Lymphs Abs: 1.1
Monocytes Absolute: 0.5
Monocytes Absolute: 0.6
Monocytes Relative: 6
Monocytes Relative: 12
Neutro Abs: 6.7
Neutro Abs: 2.8
Neutrophils Relative %: 70
Neutrophils Relative %: 61

## 2011-09-11 LAB — POCT I-STAT, CHEM 8
BUN: 22
Calcium, Ion: 1.08 — ABNORMAL LOW
Chloride: 99
Creatinine, Ser: 1.2
Glucose, Bld: 102 — ABNORMAL HIGH
HCT: 39
Hemoglobin: 13.3
Potassium: 4.5
Sodium: 131 — ABNORMAL LOW
TCO2: 28

## 2011-09-11 LAB — COMPREHENSIVE METABOLIC PANEL
ALT: 8
AST: 20
Albumin: 2.8 — ABNORMAL LOW
Alkaline Phosphatase: 45
BUN: 15
CO2: 31
Calcium: 8.7
Chloride: 93 — ABNORMAL LOW
Creatinine, Ser: 0.68
GFR calc Af Amer: >60
GFR calc non Af Amer: >60
Glucose, Bld: 86
Potassium: 2.8 — ABNORMAL LOW
Sodium: 132 — ABNORMAL LOW
Total Bilirubin: 0.5
Total Protein: 7.6

## 2011-09-11 LAB — CBC
HCT: 32.7 — ABNORMAL LOW
HCT: 35.9 — ABNORMAL LOW
Hemoglobin: 11.1 — ABNORMAL LOW
Hemoglobin: 12.3
MCHC: 33.9
MCHC: 34.2
MCV: 91.5
MCV: 92.5
Platelets: 449 — ABNORMAL HIGH
Platelets: 307
RBC: 3.57 — ABNORMAL LOW
RBC: 3.88
RDW: 13.8
RDW: 14.7
WBC: 9.5
WBC: 4.6

## 2011-09-11 LAB — TROPONIN I: Troponin I: 0.01

## 2011-09-11 LAB — CK TOTAL AND CKMB (NOT AT ARMC)
CK, MB: 1.1
Relative Index: INVALID
Total CK: 46

## 2011-09-12 LAB — POCT CARDIAC MARKERS
CKMB, poc: 1.7
Myoglobin, poc: 180
Operator id: 265201
Troponin i, poc: <0.05

## 2011-09-12 LAB — POCT I-STAT, CHEM 8
BUN: 14
Calcium, Ion: 1.12
Chloride: 94 — ABNORMAL LOW
Creatinine, Ser: 1.1
Glucose, Bld: 103 — ABNORMAL HIGH
HCT: 33 — ABNORMAL LOW
Hemoglobin: 11.2 — ABNORMAL LOW
Potassium: 3.5
Sodium: 133 — ABNORMAL LOW
TCO2: 31

## 2011-09-12 LAB — CBC
HCT: 30 — ABNORMAL LOW
Hemoglobin: 10.3 — ABNORMAL LOW
MCHC: 34.3
MCV: 91.6
Platelets: 509 — ABNORMAL HIGH
RBC: 3.27 — ABNORMAL LOW
RDW: 14
WBC: 4.2

## 2011-09-12 LAB — D-DIMER, QUANTITATIVE: D-Dimer, Quant: 0.41

## 2011-09-18 LAB — BASIC METABOLIC PANEL
BUN: 30 — ABNORMAL HIGH
CO2: 21
Calcium: 9.9
Chloride: 104
Creatinine, Ser: 2.57 — ABNORMAL HIGH
GFR calc Af Amer: 24 — ABNORMAL LOW
GFR calc non Af Amer: 20 — ABNORMAL LOW
Glucose, Bld: 87
Potassium: 5.1
Sodium: 133 — ABNORMAL LOW

## 2011-09-18 LAB — DIFFERENTIAL
Basophils Absolute: 0
Basophils Relative: 1
Eosinophils Absolute: 0.1
Eosinophils Relative: 1
Lymphocytes Relative: 34
Lymphs Abs: 1.3
Monocytes Absolute: 0.3
Monocytes Relative: 8
Neutro Abs: 2.2
Neutrophils Relative %: 56

## 2011-09-18 LAB — URINE MICROSCOPIC-ADD ON

## 2011-09-18 LAB — URINALYSIS, ROUTINE W REFLEX MICROSCOPIC
Bilirubin Urine: NEGATIVE
Glucose, UA: NEGATIVE
Ketones, ur: NEGATIVE
Nitrite: NEGATIVE
Protein, ur: 100 — AB
Specific Gravity, Urine: 1.011
Urobilinogen, UA: 0.2
pH: 6

## 2011-09-18 LAB — CBC
HCT: 24.5 — ABNORMAL LOW
Hemoglobin: 8.2 — ABNORMAL LOW
MCHC: 33.6
MCV: 103.4 — ABNORMAL HIGH
Platelets: 517 — ABNORMAL HIGH
RBC: 2.37 — ABNORMAL LOW
RDW: 15.4
WBC: 3.9 — ABNORMAL LOW

## 2011-10-25 ENCOUNTER — Telehealth: Payer: Self-pay | Admitting: *Deleted

## 2011-10-25 NOTE — Telephone Encounter (Signed)
She wanted to know who called her. I told her it was likely a call reminding her of her upcoming lab appt. There was nothing in chart to indicate one of [place] called her

## 2011-10-30 ENCOUNTER — Other Ambulatory Visit: Payer: Self-pay

## 2011-10-30 ENCOUNTER — Other Ambulatory Visit: Payer: Self-pay | Admitting: Infectious Diseases

## 2011-10-30 ENCOUNTER — Other Ambulatory Visit (INDEPENDENT_AMBULATORY_CARE_PROVIDER_SITE_OTHER): Payer: Medicaid Other

## 2011-10-30 DIAGNOSIS — B20 Human immunodeficiency virus [HIV] disease: Secondary | ICD-10-CM

## 2011-10-30 LAB — COMPLETE METABOLIC PANEL WITH GFR
ALT: 10 U/L (ref 0–35)
AST: 21 U/L (ref 0–37)
Albumin: 4.4 g/dL (ref 3.5–5.2)
Alkaline Phosphatase: 71 U/L (ref 39–117)
BUN: 24 mg/dL — ABNORMAL HIGH (ref 6–23)
CO2: 27 mEq/L (ref 19–32)
Calcium: 10 mg/dL (ref 8.4–10.5)
Chloride: 102 mEq/L (ref 96–112)
Creat: 1.04 mg/dL (ref 0.50–1.10)
GFR, Est African American: 72 mL/min/{1.73_m2}
GFR, Est Non African American: 62 mL/min/{1.73_m2}
Glucose, Bld: 99 mg/dL (ref 70–99)
Potassium: 5.1 mEq/L (ref 3.5–5.3)
Sodium: 139 mEq/L (ref 135–145)
Total Bilirubin: 0.3 mg/dL (ref 0.3–1.2)
Total Protein: 7.5 g/dL (ref 6.0–8.3)

## 2011-10-30 LAB — CBC WITH DIFFERENTIAL/PLATELET
Basophils Absolute: 0 10*3/uL (ref 0.0–0.1)
Basophils Relative: 1 % (ref 0–1)
Eosinophils Absolute: 0.1 10*3/uL (ref 0.0–0.7)
Eosinophils Relative: 1 % (ref 0–5)
HCT: 34.3 % — ABNORMAL LOW (ref 36.0–46.0)
Hemoglobin: 11.5 g/dL — ABNORMAL LOW (ref 12.0–15.0)
Lymphocytes Relative: 40 % (ref 12–46)
Lymphs Abs: 2.3 10*3/uL (ref 0.7–4.0)
MCH: 34.4 pg — ABNORMAL HIGH (ref 26.0–34.0)
MCHC: 33.5 g/dL (ref 30.0–36.0)
MCV: 102.7 fL — ABNORMAL HIGH (ref 78.0–100.0)
Monocytes Absolute: 0.4 10*3/uL (ref 0.1–1.0)
Monocytes Relative: 7 % (ref 3–12)
Neutro Abs: 2.9 10*3/uL (ref 1.7–7.7)
Neutrophils Relative %: 51 % (ref 43–77)
Platelets: 373 10*3/uL (ref 150–400)
RBC: 3.34 MIL/uL — ABNORMAL LOW (ref 3.87–5.11)
RDW: 13.5 % (ref 11.5–15.5)
WBC: 5.7 10*3/uL (ref 4.0–10.5)

## 2011-10-31 LAB — T-HELPER CELL (CD4) - (RCID CLINIC ONLY)
CD4 % Helper T Cell: 31 % — ABNORMAL LOW (ref 33–55)
CD4 T Cell Abs: 750 uL (ref 400–2700)

## 2011-11-01 LAB — HIV-1 RNA QUANT-NO REFLEX-BLD
HIV 1 RNA Quant: 26 copies/mL — ABNORMAL HIGH (ref <20)
HIV-1 RNA Quant, Log: 1.41 {Log} — ABNORMAL HIGH (ref <1.30)

## 2011-11-03 ENCOUNTER — Telehealth: Payer: Self-pay | Admitting: *Deleted

## 2011-11-03 NOTE — Telephone Encounter (Signed)
Urgent care to be seen. She agreed with this plan. Has taken ibuprofen

## 2011-11-03 NOTE — Telephone Encounter (Signed)
Pt states she woke up with a sharp pain in L ear. States it has been itching & she has been digging in it with her finger. Has tried drops. describes as sharp & throbbing. We have no appts left for today per front office staff. She does have medicaid but no pcp. Told her she could go to an

## 2011-11-13 ENCOUNTER — Encounter: Payer: Self-pay | Admitting: Internal Medicine

## 2011-11-13 ENCOUNTER — Ambulatory Visit (INDEPENDENT_AMBULATORY_CARE_PROVIDER_SITE_OTHER): Payer: Medicaid Other | Admitting: Internal Medicine

## 2011-11-13 ENCOUNTER — Ambulatory Visit: Payer: Medicaid Other | Admitting: Adult Health

## 2011-11-13 VITALS — BP 113/78 | HR 80 | Temp 97.7°F | Wt 96.8 lb

## 2011-11-13 DIAGNOSIS — B20 Human immunodeficiency virus [HIV] disease: Secondary | ICD-10-CM

## 2011-11-13 NOTE — Patient Instructions (Signed)
Follow up in 4 months 

## 2011-11-13 NOTE — Progress Notes (Signed)
  Subjective:    Patient ID: Janice Williams, female    DOB: [birth year], 50 y.o.   MRN: [id number]  HPI the patient comes in here for routine followup. She was last seen in August of this year and had been on a regimen of Sustiva and Combivir which he been taken for many years. She had no particular side effects however due to the concern for long-term affects, she was changed to once daily Atripla. Since the switch she has been doing well with no significant complaints. She does endorse a complete 100% adherence and good tolerance of the medication. She feels well today and has no new issues. She's had no recent hospitalizations and she continues to be drug free. She does tell me that her weight has been stable in fact her weight has been stable for many years and she has always been underweight. She does eat well and has no problems with dysphagia, diarrhea.    Review of Systems  Constitutional: Negative for fever, chills and fatigue.  HENT: Negative for sore throat and trouble swallowing.   Respiratory: Negative for cough and shortness of breath.   Cardiovascular: Negative for chest pain.  Gastrointestinal: Negative for nausea and diarrhea.  Genitourinary: Negative for dysuria and frequency.  Musculoskeletal: Negative for myalgias and arthralgias.  Skin: Negative for pallor and rash.  Neurological: Negative for headaches.  Hematological: Negative for adenopathy.  Psychiatric/Behavioral: Negative for dysphoric mood.       Objective:   Physical Exam  Constitutional: She appears well-developed and well-nourished. No distress.  HENT:  Right Ear: External ear normal.  Left Ear: External ear normal.  Mouth/Throat: Oropharynx is clear and moist. No oropharyngeal exudate.  Cardiovascular: Normal rate, regular rhythm and normal heart sounds.  Exam reveals no gallop and no friction rub.   No murmur heard. Pulmonary/Chest: Effort normal and breath sounds normal. No respiratory distress. She has  no wheezes.  Abdominal: Soft. Bowel sounds are normal. There is no tenderness.  Lymphadenopathy:    She has no cervical adenopathy.  Skin: Skin is warm and dry. No rash noted. No erythema.  Psychiatric: She has a normal mood and affect. Her behavior is normal.          Assessment & Plan:

## 2011-11-13 NOTE — Assessment & Plan Note (Signed)
She continues to do well on her new regimen of Atripla. Her CD4 is 750 which is up from her previous level of 500 and her viral load continues to be minimally detectable at 26 copies. I did encourage her continued adherence with her medication and the patient is motivated to continue. I did discuss smoking cessation and the need for quitting tobacco use for her long-term health. I did offer suggestions including avoiding triggers when possible. I also did discuss the need for condom use with all sexual activity. She will return in 4 months time for routine followup, sooner if needed.

## 2011-12-20 ENCOUNTER — Telehealth: Payer: Self-pay | Admitting: *Deleted

## 2011-12-20 NOTE — Telephone Encounter (Signed)
Patient called today because of a boil on her face. She said that it is draining already but it is bother some and she does not want it to get infected. She asked if we could call in an antibiotic for her. I advised her that we would have to have a doctor look at it and she was transferred to the front to make an appointment.

## 2011-12-22 ENCOUNTER — Encounter: Payer: Self-pay | Admitting: Infectious Diseases

## 2011-12-22 ENCOUNTER — Telehealth: Payer: Self-pay | Admitting: *Deleted

## 2011-12-22 ENCOUNTER — Ambulatory Visit (INDEPENDENT_AMBULATORY_CARE_PROVIDER_SITE_OTHER): Payer: Medicaid Other | Admitting: Infectious Diseases

## 2011-12-22 DIAGNOSIS — Z113 Encounter for screening for infections with a predominantly sexual mode of transmission: Secondary | ICD-10-CM

## 2011-12-22 DIAGNOSIS — Z79899 Other long term (current) drug therapy: Secondary | ICD-10-CM

## 2011-12-22 DIAGNOSIS — L700 Acne vulgaris: Secondary | ICD-10-CM | POA: Insufficient documentation

## 2011-12-22 DIAGNOSIS — B20 Human immunodeficiency virus [HIV] disease: Secondary | ICD-10-CM

## 2011-12-22 DIAGNOSIS — L708 Other acne: Secondary | ICD-10-CM

## 2011-12-22 MED ORDER — MINOCYCLINE HCL 100 MG PO CAPS: 100.0000 mg | ORAL_CAPSULE | Freq: Two times a day (BID) | ORAL | Status: AC

## 2011-12-22 NOTE — Assessment & Plan Note (Signed)
Will give her Rx for doxy an have her seen by derm. i believe she will need other therapy than just anbx (? Accutane).

## 2011-12-22 NOTE — Assessment & Plan Note (Signed)
She is doing well. Given condoms. vax up to date. See back in 3-4 months

## 2011-12-22 NOTE — Telephone Encounter (Signed)
Tried to refer patient for dermatology and our records show she has [place] access [facility] and [facility] is on her card. Referral has to come from them.  She will check her card and call back on Monday. Wendall Mola CMA

## 2011-12-22 NOTE — Progress Notes (Signed)
  Subjective:    Patient ID: Janice Williams, female    DOB: [birth year], 51 y.o.   MRN: [id number]  HPI 51 yo F with HIV+ (last CD4 750, VL 26 10-30-11). Here today with boil on her forehead for the last week. No fever or chills, Itches, has drained yellowish material. No heat. Has been seen by derm before- was put on cream but didn't help. Has previously had one cyst removed, lanced, and had cortisone injection  No prbs with atripla.  Allergic to PEN- itch.    Review of Systems     Objective:   Physical Exam  Constitutional: She appears well-developed and well-nourished.  HENT:  Head:            Assessment & Plan:

## 2011-12-27 ENCOUNTER — Other Ambulatory Visit: Payer: Self-pay | Admitting: *Deleted

## 2011-12-27 DIAGNOSIS — B20 Human immunodeficiency virus [HIV] disease: Secondary | ICD-10-CM

## 2011-12-27 DIAGNOSIS — E785 Hyperlipidemia, unspecified: Secondary | ICD-10-CM

## 2011-12-27 MED ORDER — PRAVASTATIN SODIUM 20 MG PO TABS
20.0000 mg | ORAL_TABLET | Freq: Every day | ORAL | Status: DC
Start: 2011-12-27 — End: 2012-02-06

## 2011-12-27 MED ORDER — EFAVIRENZ-EMTRICITAB-TENOFOVIR 600-200-300 MG PO TABS: 1.0000 | ORAL_TABLET | Freq: Every day | ORAL | Status: DC

## 2012-01-17 ENCOUNTER — Telehealth: Payer: Self-pay | Admitting: *Deleted

## 2012-01-17 NOTE — Telephone Encounter (Signed)
Scheduled for Fri., Feb 22nd @ 1100.

## 2012-01-22 ENCOUNTER — Other Ambulatory Visit: Payer: Self-pay | Admitting: *Deleted

## 2012-01-22 NOTE — Assessment & Plan Note (Signed)
Clinically stable on current regimen. Continue present management.  Counseling provided on prevention of transmission of HIV. Condoms offered:  Yes Medication adherence discussed with patient.  Follow up visit in 4 months with labs 2 weeks prior to appointment. Patient acknowledged information provided to them and agreed with plan of care.    

## 2012-01-22 NOTE — Telephone Encounter (Signed)
She called stating [facility] did not have any Ensure. She has been out for a while & wants some. I told her to ask them when they will have some. We do not have any here. I suggested she get instant breakfast or even chocolate milk & add ice cream to it to make a high calorie drink. She gets $21 in food stamps per month.

## 2012-01-22 NOTE — Assessment & Plan Note (Signed)
As her total cholesterol, was over 200 and her LDL was greater than 100, we discussed measures to improve cholesterol including diet, exercise, and other lifestyle changes. For now, we will continue the Pravachol. However, should she have continued elevations in total and LDL cholesterols we may need to increase the dose from 20-40 mg. She verbally acknowledged this information and agreed with plan of care.

## 2012-01-22 NOTE — Progress Notes (Signed)
Subjective:    Patient ID: Janice Williams is a 51 y.o. female.  Chief Complaint: HIV Follow-up Visit DOLLENE MALLERY is here for follow-up of HIV infection. She is feeling unchanged since her last visit.  She claims continued adherence to therapy with good tolerance and no complications. There are not additional complaints.   Data Review: Diagnostic studies reviewed.  Review of Systems - General ROS: negative for - fatigue, fever, hot flashes or malaise Psychological ROS: negative for - anxiety, behavioral disorder, concentration difficulties, depression or mood swings Ophthalmic ROS: negative for - blurry vision, decreased vision or loss of vision ENT ROS: negative for - epistaxis, headaches, hearing change, oral lesions or sore throat Respiratory ROS: no cough, shortness of breath, or wheezing Cardiovascular ROS: no chest pain or dyspnea on exertion Gastrointestinal ROS: no abdominal pain, change in bowel habits, or black or bloody stools Neurological ROS: no TIA or stroke symptoms Dermatological ROS: positive for acne  Objective:   General appearance: alert, cooperative and no distress Head: Normocephalic, without obvious abnormality, atraumatic Eyes: conjunctivae/corneas clear. PERRL, EOM's intact. Fundi benign. Throat: lips, mucosa, and tongue normal; teeth and gums normal Resp: clear to auscultation bilaterally Cardio: regular rate and rhythm, S1, S2 normal, no murmur, click, rub or gallop GI: soft, non-tender; bowel sounds normal; no masses,  no organomegaly Skin: Skin color, texture, turgor normal. No rashes or lesions or Facial acne noted Neurologic: Alert and oriented X 3, normal strength and tone. Normal symmetric reflexes. Normal coordination and gait Psych:  No vegetative signs or delusional behaviors noted.    Laboratory: From 08/03/2011 ,  CD4 count was 560 c/cmm @ 27 %. Viral load <20 copies/ml.     Assessment/Plan:   Human Immunodeficiency Virus (HIV)  Disease Clinically stable on current regimen. Continue present management.  Counseling provided on prevention of transmission of HIV. Condoms offered:  Yes Medication adherence discussed with patient.  Follow up visit in 4 months with labs 2 weeks prior to appointment. Patient acknowledged information provided to them and agreed with plan of care.   HYPERLIPIDEMIA As her total cholesterol, was over 200 and her LDL was greater than 100, we discussed measures to improve cholesterol including diet, exercise, and other lifestyle changes. For now, we will continue the Pravachol. However, should she have continued elevations in total and LDL cholesterols we may need to increase the dose from 20-40 mg. She verbally acknowledged this information and agreed with plan of care.      Lavoris Canizales A. Sundra Aland, MS, [facility] [phone]  01/22/2012, 4:32 PM

## 2012-01-23 ENCOUNTER — Other Ambulatory Visit: Payer: Medicaid Other

## 2012-01-23 DIAGNOSIS — Z79899 Other long term (current) drug therapy: Secondary | ICD-10-CM

## 2012-01-23 DIAGNOSIS — B20 Human immunodeficiency virus [HIV] disease: Secondary | ICD-10-CM

## 2012-01-23 DIAGNOSIS — Z113 Encounter for screening for infections with a predominantly sexual mode of transmission: Secondary | ICD-10-CM

## 2012-01-23 LAB — CBC WITH DIFFERENTIAL/PLATELET
Basophils Absolute: 0 10*3/uL (ref 0.0–0.1)
Basophils Relative: 1 % (ref 0–1)
Eosinophils Absolute: 0.1 10*3/uL (ref 0.0–0.7)
Eosinophils Relative: 1 % (ref 0–5)
HCT: 33.7 % — ABNORMAL LOW (ref 36.0–46.0)
Hemoglobin: 11.2 g/dL — ABNORMAL LOW (ref 12.0–15.0)
Lymphocytes Relative: 40 % (ref 12–46)
Lymphs Abs: 2.2 10*3/uL (ref 0.7–4.0)
MCH: 34 pg (ref 26.0–34.0)
MCHC: 33.2 g/dL (ref 30.0–36.0)
MCV: 102.4 fL — ABNORMAL HIGH (ref 78.0–100.0)
Monocytes Absolute: 0.4 10*3/uL (ref 0.1–1.0)
Monocytes Relative: 7 % (ref 3–12)
Neutro Abs: 2.8 10*3/uL (ref 1.7–7.7)
Neutrophils Relative %: 51 % (ref 43–77)
Platelets: 275 10*3/uL (ref 150–400)
RBC: 3.29 MIL/uL — ABNORMAL LOW (ref 3.87–5.11)
RDW: 14.2 % (ref 11.5–15.5)
WBC: 5.4 10*3/uL (ref 4.0–10.5)

## 2012-01-23 LAB — COMPLETE METABOLIC PANEL WITH GFR
ALT: 8 U/L (ref 0–35)
AST: 18 U/L (ref 0–37)
Albumin: 4.6 g/dL (ref 3.5–5.2)
Alkaline Phosphatase: 72 U/L (ref 39–117)
BUN: 19 mg/dL (ref 6–23)
CO2: 24 mEq/L (ref 19–32)
Calcium: 9.5 mg/dL (ref 8.4–10.5)
Chloride: 106 mEq/L (ref 96–112)
Creat: 1.09 mg/dL (ref 0.50–1.10)
GFR, Est African American: 68 mL/min
GFR, Est Non African American: 59 mL/min — ABNORMAL LOW
Glucose, Bld: 99 mg/dL (ref 70–99)
Potassium: 4 mEq/L (ref 3.5–5.3)
Sodium: 140 mEq/L (ref 135–145)
Total Bilirubin: 0.3 mg/dL (ref 0.3–1.2)
Total Protein: 7.2 g/dL (ref 6.0–8.3)

## 2012-01-23 LAB — LIPID PANEL
Cholesterol: 237 mg/dL — ABNORMAL HIGH (ref 0–200)
HDL: 69 mg/dL (ref >39)
LDL Cholesterol: 151 mg/dL — ABNORMAL HIGH (ref 0–99)
Total CHOL/HDL Ratio: 3.4 Ratio
Triglycerides: 87 mg/dL (ref <150)
VLDL: 17 mg/dL (ref 0–40)

## 2012-01-24 LAB — RPR

## 2012-01-24 LAB — T-HELPER CELL (CD4) - (RCID CLINIC ONLY)
CD4 % Helper T Cell: 29 % — ABNORMAL LOW (ref 33–55)
CD4 T Cell Abs: 630 uL (ref 400–2700)

## 2012-01-25 LAB — HIV-1 RNA QUANT-NO REFLEX-BLD
HIV 1 RNA Quant: 27 copies/mL — ABNORMAL HIGH (ref <20)
HIV-1 RNA Quant, Log: 1.43 {Log} — ABNORMAL HIGH (ref <1.30)

## 2012-01-29 ENCOUNTER — Telehealth: Payer: Self-pay | Admitting: *Deleted

## 2012-01-29 NOTE — Telephone Encounter (Signed)
She states she has a hard time getting here for appts. Wanted to know if she can get her pap on Tuesday the 19th when she was here for the md visit. Per Angelique Blonder, she cannot do it then. I offered to send her to the front to change her pap appt from the 22nd to another day. She declined & states she will find a way here for the pap

## 2012-01-30 ENCOUNTER — Other Ambulatory Visit: Payer: Self-pay | Admitting: *Deleted

## 2012-01-30 DIAGNOSIS — R634 Abnormal weight loss: Secondary | ICD-10-CM

## 2012-01-30 MED ORDER — ENSURE PO LIQD: 1.0000 | Freq: Three times a day (TID) | ORAL | Status: DC

## 2012-02-06 ENCOUNTER — Encounter: Payer: Self-pay | Admitting: Internal Medicine

## 2012-02-06 ENCOUNTER — Ambulatory Visit (INDEPENDENT_AMBULATORY_CARE_PROVIDER_SITE_OTHER): Payer: Medicaid Other | Admitting: Internal Medicine

## 2012-02-06 VITALS — BP 131/82 | HR 76 | Temp 97.6°F | Wt 99.0 lb

## 2012-02-06 DIAGNOSIS — F17200 Nicotine dependence, unspecified, uncomplicated: Secondary | ICD-10-CM

## 2012-02-06 DIAGNOSIS — B20 Human immunodeficiency virus [HIV] disease: Secondary | ICD-10-CM

## 2012-02-06 DIAGNOSIS — E785 Hyperlipidemia, unspecified: Secondary | ICD-10-CM

## 2012-02-06 MED ORDER — VARENICLINE TARTRATE 1 MG PO TABS: 1.0000 mg | ORAL_TABLET | Freq: Two times a day (BID) | ORAL | Status: DC

## 2012-02-06 MED ORDER — PRAVASTATIN SODIUM 20 MG PO TABS: 40.0000 mg | ORAL_TABLET | Freq: Every day | ORAL | Status: DC

## 2012-02-06 MED ORDER — VARENICLINE TARTRATE 0.5 MG X 11 & 1 MG X 42 PO MISC: ORAL | Status: DC

## 2012-02-06 NOTE — Assessment & Plan Note (Signed)
She continues to do well and is pleased with the Atripla.  She will continue.  She was reminded to use condoms.

## 2012-02-06 NOTE — Patient Instructions (Signed)
Follow up in 4 months.  Call if you get muscle aches

## 2012-02-06 NOTE — Progress Notes (Signed)
  Subjective:    Patient ID: Janice Williams, female    DOB: 1961-08-16, 52 y.o.   MRN: [id number]  HPI she comes in for follow up of her HIV.  She continues to take Atripla and reports 100% compliance.  She denies missed doses and good tolerance.  She has had no recent hospitalizations or other issues.  She does continue to smoke and is really interested in smoking cessation.  She has tried the patch and other methods, but never Chantix.  She has no history of depression or SI.      Review of Systems  Constitutional: Negative for fever, chills, appetite change, fatigue and unexpected weight change.  HENT: Negative for sore throat and trouble swallowing.   Respiratory: Negative for chest tightness, shortness of breath and wheezing.   Cardiovascular: Negative for chest pain, palpitations and leg swelling.  Gastrointestinal: Negative for nausea, abdominal pain and diarrhea.  Genitourinary: Negative for genital sores, menstrual problem and pelvic pain.  Musculoskeletal: Negative for myalgias and arthralgias.  Skin: Negative for pallor and rash.  Neurological: Negative for weakness and headaches.  Hematological: Negative for adenopathy.  Psychiatric/Behavioral: Negative for suicidal ideas, sleep disturbance and dysphoric mood. The patient is not nervous/anxious.        Objective:   Physical Exam  Constitutional: She is oriented to person, place, and time.       She is thin, but tells me she always has been  HENT:  Mouth/Throat: Oropharynx is clear and moist. No oropharyngeal exudate.  Cardiovascular: Normal rate, regular rhythm and normal heart sounds.  Exam reveals no gallop and no friction rub.   No murmur heard. Pulmonary/Chest: Effort normal and breath sounds normal. No respiratory distress. She has no wheezes. She has no rales.  Abdominal: Soft. Bowel sounds are normal. She exhibits no distension. There is no tenderness. There is no rebound.  Lymphadenopathy:    She has no cervical  adenopathy.  Neurological: She is alert and oriented to person, place, and time.  Skin: Skin is warm and dry. No rash noted. No erythema.  Psychiatric: She has a normal mood and affect. Her behavior is normal.          Assessment & Plan:

## 2012-02-06 NOTE — Assessment & Plan Note (Signed)
She is interested in Chantix.  I have discussed how to use it.  She can smoke during the first week but thn has to quit.  She is very interested in quitting.

## 2012-02-06 NOTE — Assessment & Plan Note (Signed)
Her LDL is still elevated and so will increase the Pravachol to 40 mg.  I told her to let me know if she develops any new myalgias or abdominal pain.

## 2012-02-09 ENCOUNTER — Other Ambulatory Visit (HOSPITAL_COMMUNITY)
Admission: RE | Admit: 2012-02-09 | Discharge: 2012-02-09 | Disposition: A | Discharge status: Home / Self Care | Payer: Medicaid Other | Source: Ambulatory Visit | Attending: Infectious Diseases | Admitting: Infectious Diseases

## 2012-02-09 ENCOUNTER — Ambulatory Visit (INDEPENDENT_AMBULATORY_CARE_PROVIDER_SITE_OTHER): Payer: Medicaid Other | Admitting: *Deleted

## 2012-02-09 DIAGNOSIS — N63 Unspecified lump in unspecified breast: Secondary | ICD-10-CM

## 2012-02-09 DIAGNOSIS — Z01419 Encounter for gynecological examination (general) (routine) without abnormal findings: Secondary | ICD-10-CM | POA: Insufficient documentation

## 2012-02-09 DIAGNOSIS — Z1231 Encounter for screening mammogram for malignant neoplasm of breast: Secondary | ICD-10-CM

## 2012-02-09 DIAGNOSIS — N6320 Unspecified lump in the left breast, unspecified quadrant: Secondary | ICD-10-CM

## 2012-02-09 DIAGNOSIS — Z124 Encounter for screening for malignant neoplasm of cervix: Secondary | ICD-10-CM

## 2012-02-09 NOTE — Patient Instructions (Signed)
Your results will be ready in about a week. I will either call you or mail your results.  As we discussed if a referral to [facility] is necessary I will call you.  Thank you for coming to the Center for your care.  Angelique Blonder

## 2012-02-09 NOTE — Progress Notes (Signed)
  Subjective:     Janice Williams is a 51 y.o. woman who comes in today for a  pap smear only.   Previous abnormal Pap smears.  Does not remember any treatment after abnormal PAP.  Menopausal.    Objective:    There were no vitals taken for this visit. Pelvic Exam: Pap smear obtained.  Whitish discharge.  Cervix friable, slight bleeding after sample obtained.  Cyst-like lesion on left labia, 2X1 cm.  Moveable when touched.  Non-painful.  Assessment:    Screening pap smear.  Pt reports history of previous abnormal pap smear.  Pt needs screening mammogram.  Plan:    Follow up in one year, or as indicated by Pap results.  Pt given educational materials re:  BSE, nutrition, diet, exercise, HIV over 50, and managing lipids.  Pt given telephone number to call to schedule Screening Mammogram.  Order placed for mammogram.

## 2012-02-12 ENCOUNTER — Telehealth: Payer: Self-pay | Admitting: *Deleted

## 2012-02-12 DIAGNOSIS — N6320 Unspecified lump in the left breast, unspecified quadrant: Secondary | ICD-10-CM

## 2012-02-12 NOTE — Progress Notes (Signed)
Addended by: Jennet Maduro D on: 02/12/2012 01:05 PM   Modules accepted: Orders

## 2012-02-12 NOTE — Telephone Encounter (Signed)
Order changed to bil. Diagnostic mm and l breast u/s

## 2012-02-12 NOTE — Progress Notes (Signed)
Addended by: Jennet Maduro D on: 02/12/2012 09:42 AM   Modules accepted: Orders

## 2012-02-20 ENCOUNTER — Encounter: Payer: Self-pay | Admitting: *Deleted

## 2012-02-20 ENCOUNTER — Ambulatory Visit
Admission: RE | Admit: 2012-02-20 | Discharge: 2012-02-20 | Disposition: A | Discharge status: Home / Self Care | Payer: Medicaid Other | Source: Ambulatory Visit | Attending: Internal Medicine | Admitting: Internal Medicine

## 2012-02-20 DIAGNOSIS — N6320 Unspecified lump in the left breast, unspecified quadrant: Secondary | ICD-10-CM

## 2012-02-28 ENCOUNTER — Other Ambulatory Visit: Payer: Self-pay | Admitting: *Deleted

## 2012-02-28 MED ORDER — FLUTICASONE FUROATE 27.5 MCG/SPRAY NA SUSP: 1.0000 | Freq: Two times a day (BID) | NASAL | Status: DC

## 2012-03-04 ENCOUNTER — Telehealth: Payer: Self-pay | Admitting: *Deleted

## 2012-03-04 NOTE — Telephone Encounter (Signed)
Patient called to inquire if this office [facility] had any Ensure.  They did and a case was left up front for her to pick up. Wendall Mola CMA

## 2012-03-27 ENCOUNTER — Other Ambulatory Visit: Payer: Self-pay | Admitting: *Deleted

## 2012-03-27 DIAGNOSIS — Z113 Encounter for screening for infections with a predominantly sexual mode of transmission: Secondary | ICD-10-CM

## 2012-05-03 ENCOUNTER — Other Ambulatory Visit: Payer: Self-pay | Admitting: Infectious Diseases

## 2012-05-03 ENCOUNTER — Other Ambulatory Visit: Payer: Self-pay | Admitting: Adult Health

## 2012-05-23 ENCOUNTER — Other Ambulatory Visit: Payer: Medicaid Other

## 2012-05-23 DIAGNOSIS — B20 Human immunodeficiency virus [HIV] disease: Secondary | ICD-10-CM

## 2012-05-23 LAB — COMPREHENSIVE METABOLIC PANEL
ALT: 9 U/L (ref 0–35)
AST: 18 U/L (ref 0–37)
Albumin: 4.2 g/dL (ref 3.5–5.2)
Alkaline Phosphatase: 69 U/L (ref 39–117)
BUN: 21 mg/dL (ref 6–23)
CO2: 27 mEq/L (ref 19–32)
Calcium: 9.4 mg/dL (ref 8.4–10.5)
Chloride: 105 mEq/L (ref 96–112)
Creat: 1.06 mg/dL (ref 0.50–1.10)
Glucose, Bld: 89 mg/dL (ref 70–99)
Potassium: 4.9 mEq/L (ref 3.5–5.3)
Sodium: 139 mEq/L (ref 135–145)
Total Bilirubin: 0.3 mg/dL (ref 0.3–1.2)
Total Protein: 6.9 g/dL (ref 6.0–8.3)

## 2012-05-23 LAB — CBC WITH DIFFERENTIAL/PLATELET
Basophils Absolute: 0 10*3/uL (ref 0.0–0.1)
Basophils Relative: 1 % (ref 0–1)
Eosinophils Absolute: 0.1 10*3/uL (ref 0.0–0.7)
Eosinophils Relative: 1 % (ref 0–5)
HCT: 31.9 % — ABNORMAL LOW (ref 36.0–46.0)
Hemoglobin: 11 g/dL — ABNORMAL LOW (ref 12.0–15.0)
Lymphocytes Relative: 45 % (ref 12–46)
Lymphs Abs: 2.3 10*3/uL (ref 0.7–4.0)
MCH: 33.8 pg (ref 26.0–34.0)
MCHC: 34.5 g/dL (ref 30.0–36.0)
MCV: 98.2 fL (ref 78.0–100.0)
Monocytes Absolute: 0.3 10*3/uL (ref 0.1–1.0)
Monocytes Relative: 6 % (ref 3–12)
Neutro Abs: 2.4 10*3/uL (ref 1.7–7.7)
Neutrophils Relative %: 47 % (ref 43–77)
Platelets: 313 10*3/uL (ref 150–400)
RBC: 3.25 MIL/uL — ABNORMAL LOW (ref 3.87–5.11)
RDW: 14.3 % (ref 11.5–15.5)
WBC: 5 10*3/uL (ref 4.0–10.5)

## 2012-05-24 LAB — T-HELPER CELL (CD4) - (RCID CLINIC ONLY)
CD4 % Helper T Cell: 27 % — ABNORMAL LOW (ref 33–55)
CD4 T Cell Abs: 610 uL (ref 400–2700)

## 2012-05-27 LAB — HIV-1 RNA QUANT-NO REFLEX-BLD
HIV 1 RNA Quant: <20 copies/mL (ref <20)
HIV-1 RNA Quant, Log: <1.3 {Log} (ref <1.30)

## 2012-06-04 ENCOUNTER — Other Ambulatory Visit: Payer: Self-pay | Admitting: Infectious Diseases

## 2012-06-04 ENCOUNTER — Other Ambulatory Visit: Payer: Self-pay | Admitting: Adult Health

## 2012-06-06 ENCOUNTER — Encounter: Payer: Self-pay | Admitting: Internal Medicine

## 2012-06-06 ENCOUNTER — Ambulatory Visit: Payer: Medicaid Other | Admitting: Adult Health

## 2012-06-06 ENCOUNTER — Ambulatory Visit (INDEPENDENT_AMBULATORY_CARE_PROVIDER_SITE_OTHER): Payer: Medicaid Other | Admitting: Internal Medicine

## 2012-06-06 VITALS — BP 127/81 | HR 75 | Temp 97.8°F | Wt 95.0 lb

## 2012-06-06 DIAGNOSIS — Z0000 Encounter for general adult medical examination without abnormal findings: Secondary | ICD-10-CM

## 2012-06-06 DIAGNOSIS — Z23 Encounter for immunization: Secondary | ICD-10-CM

## 2012-06-06 DIAGNOSIS — R634 Abnormal weight loss: Secondary | ICD-10-CM

## 2012-06-06 DIAGNOSIS — E46 Unspecified protein-calorie malnutrition: Secondary | ICD-10-CM

## 2012-06-06 MED ORDER — MEGESTROL ACETATE 20 MG PO TABS: 40.0000 mg | ORAL_TABLET | Freq: Every day | ORAL | Status: DC

## 2012-07-04 ENCOUNTER — Encounter: Payer: Self-pay | Admitting: Internal Medicine

## 2012-07-04 ENCOUNTER — Ambulatory Visit (INDEPENDENT_AMBULATORY_CARE_PROVIDER_SITE_OTHER): Payer: Medicaid Other | Admitting: Internal Medicine

## 2012-07-04 VITALS — BP 104/68 | HR 80 | Temp 98.2°F | Wt 95.0 lb

## 2012-07-04 DIAGNOSIS — B20 Human immunodeficiency virus [HIV] disease: Secondary | ICD-10-CM

## 2012-07-05 NOTE — Progress Notes (Signed)
HIV CLINIC NOTE  RFV: routine follow up Subjective:    Patient ID: Janice Williams, female    DOB: July 13, 1961, 51 y.o.   MRN: [id number]  HPI Ms. Heng is a 51yo F with HIV, CD 4 count of 610(27)/VL <20, in June 2013, currently having great adherence on Atripla. She is excited for the upcoming month where she is planning to go back to school at [employer] for her GED. She is still undergoing her placement exams. She denies any other complaints.   Current Outpatient Prescriptions on File Prior to Visit  Medication Sig Dispense Refill  . amLODipine (NORVASC) 10 MG tablet TAKE ONE TABLET BY MOUTH DAILY  30 tablet  6  . ATRIPLA 600-200-300 MG per tablet Take 1 tablet by mouth at bedtime.  30 each  11  . ENSURE (ENSURE) Take 1 Can by mouth 3 (three) times daily between meals.  237 mL  11  . fluticasone (VERAMYST) 27.5 MCG/SPRAY nasal spray Place 1 spray into the nose 2 (two) times daily.  10 g  6  . megestrol (MEGACE) 20 MG tablet Take 2 tablets (40 mg total) by mouth daily.  30 tablet  3  . mometasone (NASONEX) 50 MCG/ACT nasal spray 1 spray by Nasal route 2 (two) times daily.        [name] omeprazole (PRILOSEC) 20 MG capsule TAKE ONE CAPSULE BY MOUTH DAILY  30 capsule  3  . pravastatin (PRAVACHOL) 20 MG tablet Take 2 tablets (40 mg total) by mouth daily.  30 tablet  5  . pravastatin (PRAVACHOL) 20 MG tablet Take 1 tablet (20 mg total) by mouth daily.  30 tablet  6   Active Ambulatory Problems    Diagnosis Date Noted  . Human Immunodeficiency Virus (HIV) Disease 10/29/2008  . HYPERLIPIDEMIA 06/03/2009  . ANEMIA-NOS 10/29/2008  . TOBACCO DEPENDENCE 02/14/2007  . PERIPHERAL NEUROPATHY 10/29/2008  . EXTERNAL OTITIS 01/06/2010  . CVA 02/14/2007  . ACUTE SINUSITIS, UNSPECIFIED 01/20/2010  . RHINITIS, ALLERGIC 02/14/2007  . GERD 10/29/2008  . DYSPEPSIA 02/14/2007  . RENAL INSUFFICIENCY 10/29/2008  . CARBUNCLE AND FURUNCLE OF FACE 02/04/2009  . ACNE 02/14/2007  . FACIAL RASH 02/04/2009  . WEIGHT LOSS,  ABNORMAL 02/14/2007  . WEIGHT LOSS 06/03/2009  . CEREBROVASCULAR ACCIDENT, HX OF 10/29/2008  . EPIDERMOID CYST 02/10/2011  . Dyslipidemia 07/06/2011  . Routine health maintenance 07/06/2011  . Acne cystica 12/22/2011   Resolved Ambulatory Problems    Diagnosis Date Noted  . No Resolved Ambulatory Problems   Past Medical History  Diagnosis Date  . HIV infection   . Substance abuse       Review of Systems     Objective:   Physical Exam  BP 104/68  Pulse 80  Temp 98.2 F (36.8 C) (Oral)  Wt 95 lb (43.092 kg) Physical Exam  Constitutional:  oriented to person, place, and time. She is thin. No distress.  HENT: [place]/AT, numerous skin lesions consistent with a cystic acne, no open lesions Mouth/Throat: Oropharynx is clear and moist. No oropharyngeal exudate.  Cardiovascular: Normal rate, regular rhythm and normal heart sounds. Exam reveals no gallop and no friction rub.  No murmur heard.  Pulmonary/Chest: Effort normal and breath sounds normal. No respiratory distress.  no wheezes.  Abdominal: Soft. Bowel sounds are normal. He exhibits no distension. There is no tenderness.  Lymphadenopathy: no cervical  Or axillary adenopathy.  Skin: Skin is warm and dry. No rash noted. No erythema.  Psychiatric: pleasant mood and affect.  Assessment & Plan:  HIV = continue on Atripla  Health maintenance = uptodate on mammo and pap, will check GC and chlam and UA at next visit  Smoking cessation = currently smoking 1/2 ppd per day, she is going to try to decrease to 1/3 ppd by next visit.  rtc in 3 mon

## 2012-07-22 ENCOUNTER — Other Ambulatory Visit: Payer: Self-pay | Admitting: Licensed Clinical Social Worker

## 2012-07-22 DIAGNOSIS — I10 Essential (primary) hypertension: Secondary | ICD-10-CM

## 2012-07-22 MED ORDER — AMLODIPINE BESYLATE 10 MG PO TABS: 10.0000 mg | ORAL_TABLET | Freq: Every day | ORAL | Status: DC

## 2012-08-05 ENCOUNTER — Other Ambulatory Visit: Payer: Self-pay | Admitting: Internal Medicine

## 2012-08-05 DIAGNOSIS — R630 Anorexia: Secondary | ICD-10-CM

## 2012-08-22 ENCOUNTER — Other Ambulatory Visit: Payer: Self-pay | Admitting: *Deleted

## 2012-08-22 DIAGNOSIS — R634 Abnormal weight loss: Secondary | ICD-10-CM

## 2012-08-22 MED ORDER — ENSURE PO LIQD: 1.0000 | Freq: Three times a day (TID) | ORAL | Status: DC

## 2012-08-29 ENCOUNTER — Other Ambulatory Visit: Payer: Self-pay | Admitting: Internal Medicine

## 2012-08-29 DIAGNOSIS — B20 Human immunodeficiency virus [HIV] disease: Secondary | ICD-10-CM

## 2012-09-03 ENCOUNTER — Other Ambulatory Visit: Payer: Self-pay | Admitting: Infectious Diseases

## 2012-09-04 ENCOUNTER — Other Ambulatory Visit: Payer: Self-pay | Admitting: *Deleted

## 2012-09-04 NOTE — Telephone Encounter (Signed)
RN noted 2 pravachol rxes when reviewing medications.  Phone message left for pt to call to discuss how and what she is taking related to the pravachol rx.  These prescriptions were sent to 2 different pharmacies.  Dr. Luciana Axe increased the dose to 40mg  last February 2013.

## 2012-09-05 ENCOUNTER — Telehealth: Payer: Self-pay

## 2012-09-05 NOTE — Telephone Encounter (Signed)
Pt calling.  She says Dr Drue Second called her on 09-03-12 and she is returning the call.  Please advise.   Laurell Josephs, RN

## 2012-09-15 NOTE — Progress Notes (Signed)
HIV CLINIC VISIT  RFV: routine visit Subjective:    Patient ID: Janice Williams, female    DOB: 29-Feb-1961, 51 y.o.   MRN: [id number]  HPI Janice Williams is a 51yo F with HIV, CD 4 count of 610(27%)/VL < 20, currently on atripla, doing well with medication. She repots losing 5 lbs since last visit. Reviewed her diet and it appears she is eating sufficient calories. No chills, nightsweats, fever, diarrhea. Rash. headache  Current Outpatient Prescriptions on File Prior to Visit  Medication Sig Dispense Refill  . ATRIPLA 600-200-300 MG per tablet Take 1 tablet by mouth at bedtime.  30 each  11  . fluticasone (VERAMYST) 27.5 MCG/SPRAY nasal spray Place 1 spray into the nose 2 (two) times daily.  10 g  6  . mometasone (NASONEX) 50 MCG/ACT nasal spray 1 spray by Nasal route 2 (two) times daily.        . pravastatin (PRAVACHOL) 20 MG tablet Take 2 tablets (40 mg total) by mouth daily.  30 tablet  5  . pravastatin (PRAVACHOL) 20 MG tablet Take 1 tablet (20 mg total) by mouth daily.  30 tablet  6  . amLODipine (NORVASC) 10 MG tablet Take 1 tablet (10 mg total) by mouth daily.  30 tablet  6  . omeprazole (PRILOSEC) 20 MG capsule TAKE ONE CAPSULE BY MOUTH DAILY  30 capsule  2   Active Ambulatory Problems    Diagnosis Date Noted  . Human Immunodeficiency Virus (HIV) Disease 10/29/2008  . HYPERLIPIDEMIA 06/03/2009  . ANEMIA-NOS 10/29/2008  . TOBACCO DEPENDENCE 02/14/2007  . PERIPHERAL NEUROPATHY 10/29/2008  . EXTERNAL OTITIS 01/06/2010  . CVA 02/14/2007  . ACUTE SINUSITIS, UNSPECIFIED 01/20/2010  . RHINITIS, ALLERGIC 02/14/2007  . GERD 10/29/2008  . DYSPEPSIA 02/14/2007  . RENAL INSUFFICIENCY 10/29/2008  . CARBUNCLE AND FURUNCLE OF FACE 02/04/2009  . ACNE 02/14/2007  . FACIAL RASH 02/04/2009  . WEIGHT LOSS, ABNORMAL 02/14/2007  . WEIGHT LOSS 06/03/2009  . CEREBROVASCULAR ACCIDENT, HX OF 10/29/2008  . EPIDERMOID CYST 02/10/2011  . Dyslipidemia 07/06/2011  . Routine health maintenance 07/06/2011  .  Acne cystica 12/22/2011   Resolved Ambulatory Problems    Diagnosis Date Noted  . No Resolved Ambulatory Problems   Past Medical History  Diagnosis Date  . HIV infection   . Substance abuse    History  Substance Use Topics  . Smoking status: Current Every Day Smoker -- 0.4 packs/day    Types: Cigarettes  . Smokeless tobacco: Never Used  . Alcohol Use: No      Review of Systems 10 point ROS reviewed non contributory    Objective:   Physical Exam  BP 127/81  Pulse 75  Temp 97.8 F (36.6 C) (Oral)  Wt 95 lb (43.092 kg) Physical Exam  Constitutional:  oriented to person, place, and time.  appears well-developed and well-nourished. No distress.  HENT:  Mouth/Throat: Oropharynx is clear and moist. No oropharyngeal exudate.  Cardiovascular: Normal rate, regular rhythm and normal heart sounds. Exam reveals no gallop and no friction rub.  No murmur heard.  Pulmonary/Chest: Effort normal and breath sounds normal. No respiratory distress. He has no wheezes.  Abdominal: Soft. Bowel sounds are normal.  exhibits no distension. There is no tenderness.  Lymphadenopathy:s no cervical adenopathy.   Skin: Skin is warm and dry. No rash noted. No erythema.         Assessment & Plan:  HIV = continue on atripla  Health maintenance = she is due for tdap.booster. Also  will be give flu vax at next visit  malnutrition= BMI 15.8 will ask for her to take in ensure to help gain weight.

## 2012-09-19 ENCOUNTER — Ambulatory Visit (INDEPENDENT_AMBULATORY_CARE_PROVIDER_SITE_OTHER): Payer: Medicaid Other

## 2012-09-19 DIAGNOSIS — Z23 Encounter for immunization: Secondary | ICD-10-CM

## 2012-09-24 ENCOUNTER — Other Ambulatory Visit: Payer: Medicaid Other

## 2012-10-08 ENCOUNTER — Ambulatory Visit: Payer: Medicaid Other | Admitting: Internal Medicine

## 2012-11-04 ENCOUNTER — Other Ambulatory Visit (INDEPENDENT_AMBULATORY_CARE_PROVIDER_SITE_OTHER): Payer: Medicaid Other

## 2012-11-04 ENCOUNTER — Telehealth: Payer: Self-pay | Admitting: *Deleted

## 2012-11-04 ENCOUNTER — Telehealth: Payer: Self-pay

## 2012-11-04 DIAGNOSIS — B20 Human immunodeficiency virus [HIV] disease: Secondary | ICD-10-CM

## 2012-11-04 DIAGNOSIS — R0989 Other specified symptoms and signs involving the circulatory and respiratory systems: Secondary | ICD-10-CM

## 2012-11-04 LAB — COMPREHENSIVE METABOLIC PANEL
ALT: 9 U/L (ref 0–35)
AST: 18 U/L (ref 0–37)
Albumin: 4.2 g/dL (ref 3.5–5.2)
Alkaline Phosphatase: 60 U/L (ref 39–117)
BUN: 18 mg/dL (ref 6–23)
CO2: 27 mEq/L (ref 19–32)
Calcium: 9.5 mg/dL (ref 8.4–10.5)
Chloride: 102 mEq/L (ref 96–112)
Creat: 1.2 mg/dL — ABNORMAL HIGH (ref 0.50–1.10)
Glucose, Bld: 118 mg/dL — ABNORMAL HIGH (ref 70–99)
Potassium: 4.4 mEq/L (ref 3.5–5.3)
Sodium: 137 mEq/L (ref 135–145)
Total Bilirubin: 0.2 mg/dL — ABNORMAL LOW (ref 0.3–1.2)
Total Protein: 7.1 g/dL (ref 6.0–8.3)

## 2012-11-04 LAB — CBC WITH DIFFERENTIAL/PLATELET
Basophils Absolute: 0 10*3/uL (ref 0.0–0.1)
Basophils Relative: 0 % (ref 0–1)
Eosinophils Absolute: 0.1 10*3/uL (ref 0.0–0.7)
Eosinophils Relative: 1 % (ref 0–5)
HCT: 32.1 % — ABNORMAL LOW (ref 36.0–46.0)
Hemoglobin: 11.1 g/dL — ABNORMAL LOW (ref 12.0–15.0)
Lymphocytes Relative: 54 % — ABNORMAL HIGH (ref 12–46)
Lymphs Abs: 3.2 10*3/uL (ref 0.7–4.0)
MCH: 34.8 pg — ABNORMAL HIGH (ref 26.0–34.0)
MCHC: 34.6 g/dL (ref 30.0–36.0)
MCV: 100.6 fL — ABNORMAL HIGH (ref 78.0–100.0)
Monocytes Absolute: 0.3 10*3/uL (ref 0.1–1.0)
Monocytes Relative: 6 % (ref 3–12)
Neutro Abs: 2.2 10*3/uL (ref 1.7–7.7)
Neutrophils Relative %: 39 % — ABNORMAL LOW (ref 43–77)
Platelets: 357 10*3/uL (ref 150–400)
RBC: 3.19 MIL/uL — ABNORMAL LOW (ref 3.87–5.11)
RDW: 13.2 % (ref 11.5–15.5)
WBC: 5.8 10*3/uL (ref 4.0–10.5)

## 2012-11-04 MED ORDER — MOXIFLOXACIN HCL 400 MG PO TABS: 400.0000 mg | ORAL_TABLET | Freq: Every day | ORAL | Status: DC

## 2012-11-04 NOTE — Telephone Encounter (Signed)
Per Dr Drue Second ok for Avelox 400 mg once daily for 5 days   Pt advised if symptoms worsen to call our office for OV.    Laurell Josephs, RN

## 2012-11-04 NOTE — Telephone Encounter (Signed)
Pt has c/o productive cough with green sputum X 2 weeks.  Sinus drainage and congestion present with sinus pain. She has tired OTC medications without relief .Pt states her congestion has not started in her chest.    Pt would like antibiotics if possible.   Pt uses [facility] on [address].    Laurell Josephs, RN   Please advise.

## 2012-11-04 NOTE — Telephone Encounter (Signed)
Pharmacy called and avelox not covered on patient's insurance, per Dr. Drue Second she changed her Rx to a z-pak. This was called to patient's pharmacy. Wendall Mola CMA

## 2012-11-05 ENCOUNTER — Other Ambulatory Visit: Payer: Self-pay | Admitting: *Deleted

## 2012-11-05 DIAGNOSIS — J069 Acute upper respiratory infection, unspecified: Secondary | ICD-10-CM

## 2012-11-05 LAB — T-HELPER CELL (CD4) - (RCID CLINIC ONLY)
CD4 % Helper T Cell: 34 % (ref 33–55)
CD4 T Cell Abs: 1040 uL (ref 400–2700)

## 2012-11-05 LAB — HIV-1 RNA QUANT-NO REFLEX-BLD
HIV 1 RNA Quant: 293 copies/mL — ABNORMAL HIGH (ref <20)
HIV-1 RNA Quant, Log: 2.47 {Log} — ABNORMAL HIGH (ref <1.30)

## 2012-11-05 MED ORDER — AZITHROMYCIN 250 MG PO TABS: ORAL_TABLET | ORAL | Status: DC

## 2012-11-19 ENCOUNTER — Ambulatory Visit (INDEPENDENT_AMBULATORY_CARE_PROVIDER_SITE_OTHER): Payer: Medicaid Other | Admitting: Internal Medicine

## 2012-11-19 ENCOUNTER — Encounter: Payer: Self-pay | Admitting: *Deleted

## 2012-11-19 ENCOUNTER — Other Ambulatory Visit: Payer: Self-pay | Admitting: *Deleted

## 2012-11-19 ENCOUNTER — Encounter: Payer: Self-pay | Admitting: Internal Medicine

## 2012-11-19 VITALS — BP 97/66 | HR 81 | Temp 98.2°F | Ht 65.0 in | Wt 98.2 lb

## 2012-11-19 DIAGNOSIS — E46 Unspecified protein-calorie malnutrition: Secondary | ICD-10-CM

## 2012-11-19 DIAGNOSIS — K219 Gastro-esophageal reflux disease without esophagitis: Secondary | ICD-10-CM

## 2012-11-19 DIAGNOSIS — E785 Hyperlipidemia, unspecified: Secondary | ICD-10-CM

## 2012-11-19 DIAGNOSIS — R630 Anorexia: Secondary | ICD-10-CM

## 2012-11-19 MED ORDER — DRONABINOL 5 MG PO CAPS: 5.0000 mg | ORAL_CAPSULE | Freq: Two times a day (BID) | ORAL | Status: DC

## 2012-11-19 MED ORDER — ATORVASTATIN CALCIUM 20 MG PO TABS: 40.0000 mg | ORAL_TABLET | Freq: Every day | ORAL | Status: DC

## 2012-11-19 MED ORDER — ATORVASTATIN CALCIUM 20 MG PO TABS: 20.0000 mg | ORAL_TABLET | Freq: Every day | ORAL | Status: DC

## 2012-11-19 MED ORDER — ATORVASTATIN CALCIUM 40 MG PO TABS: 40.0000 mg | ORAL_TABLET | Freq: Every day | ORAL | Status: DC

## 2012-11-19 NOTE — Progress Notes (Signed)
HIV CLINIC NOTE  RFV: routine visit Subjective:    Patient ID: Janice Williams, female    DOB: 29-Oct-1961, 51 y.o.   MRN: [id number]  HPI 51yo F with HIV, CD 4 count of 1040/ VL 293, previously undetectable, currently on atripla. She was remember feeling ill when she had her blood drawn, and presumptively treated for pneumonia. She reports not missing doses of medicine.Only slight weight gain, taking ensure ( 2cases per month) in addition to megace that hasn't helped much with appetite stimulation.  Current Outpatient Prescriptions on File Prior to Visit  Medication Sig Dispense Refill  . amLODipine (NORVASC) 10 MG tablet Take 1 tablet (10 mg total) by mouth daily.  30 tablet  6  . ATRIPLA 600-200-300 MG per tablet Take 1 tablet by mouth at bedtime.  30 each  11  . ENSURE (ENSURE) Take 1 Can by mouth 3 (three) times daily between meals.  237 mL  11  . fluticasone (VERAMYST) 27.5 MCG/SPRAY nasal spray Place 1 spray into the nose 2 (two) times daily.  10 g  6  . megestrol (MEGACE) 20 MG tablet TAKE TWO TABLETS BY MOUTH DAILY  60 tablet  3  . omeprazole (PRILOSEC) 20 MG capsule TAKE ONE CAPSULE BY MOUTH DAILY  30 capsule  2  . pravastatin (PRAVACHOL) 20 MG tablet Take 1 tablet (20 mg total) by mouth daily.  30 tablet  6  . mometasone (NASONEX) 50 MCG/ACT nasal spray 1 spray by Nasal route 2 (two) times daily.        [name] moxifloxacin (AVELOX) 400 MG tablet Take 1 tablet (400 mg total) by mouth daily.  5 tablet  0  . pravastatin (PRAVACHOL) 20 MG tablet Take 2 tablets (40 mg total) by mouth daily.  30 tablet  5   Active Ambulatory Problems    Diagnosis Date Noted  . Human Immunodeficiency Virus (HIV) Disease 10/29/2008  . HYPERLIPIDEMIA 06/03/2009  . ANEMIA-NOS 10/29/2008  . TOBACCO DEPENDENCE 02/14/2007  . PERIPHERAL NEUROPATHY 10/29/2008  . EXTERNAL OTITIS 01/06/2010  . CVA 02/14/2007  . ACUTE SINUSITIS, UNSPECIFIED 01/20/2010  . RHINITIS, ALLERGIC 02/14/2007  . GERD 10/29/2008  . DYSPEPSIA  02/14/2007  . RENAL INSUFFICIENCY 10/29/2008  . CARBUNCLE AND FURUNCLE OF FACE 02/04/2009  . ACNE 02/14/2007  . FACIAL RASH 02/04/2009  . WEIGHT LOSS, ABNORMAL 02/14/2007  . WEIGHT LOSS 06/03/2009  . CEREBROVASCULAR ACCIDENT, HX OF 10/29/2008  . EPIDERMOID CYST 02/10/2011  . Dyslipidemia 07/06/2011  . Routine health maintenance 07/06/2011  . Acne cystica 12/22/2011   Resolved Ambulatory Problems    Diagnosis Date Noted  . No Resolved Ambulatory Problems   Past Medical History  Diagnosis Date  . HIV infection   . Substance abuse     Review of Systems     Objective:   Physical Exam BP 97/66  Pulse 81  Temp 98.2 F (36.8 C) (Oral)  Ht 5\' 5"  (1.651 m)  Wt 98 lb 4 oz (44.566 kg)  BMI 16.35 kg/m2 Physical Exam  Constitutional: oriented to person, place, and time. Appears thin, undeweight. No distress.  HENT:  Mouth/Throat: Oropharynx is clear and moist. No oropharyngeal exudate.  Cardiovascular: Normal rate, regular rhythm and normal heart sounds. Exam reveals no gallop and no friction rub.  No murmur heard.  Pulmonary/Chest: Effort normal and breath sounds normal. No respiratory distress.  no wheezes.  Abdominal: Soft. Bowel sounds are normal.  exhibits no distension. There is no tenderness.  Lymphadenopathy:  no cervical adenopathy.  Skin: Skin is warm and dry. No rash noted. No erythema.       Assessment & Plan:  HIV=continue with atripla. She may just have a viral blip in the setting of being ill.  Anorexia/malnutrition = will change to marinol. And stop megace since not working.  Health maintenance = up to date on flu, dtap and pneumonia vaccine. Will schedule mammo at next visit. Also needs colon screening  Smoking cessation = 1/2 ppd day. She is planning on having 1 pack last 3 days, then will start chantix at next visit.  Hx of cva/hyperlipidemia = change to lipitor 40mg  daily to be at goal given hx of CVA  rtc in 3 months. Labs 2 wks prior

## 2012-11-20 ENCOUNTER — Telehealth: Payer: Self-pay | Admitting: *Deleted

## 2012-11-20 NOTE — Telephone Encounter (Signed)
Lyrica needing prior authorization

## 2012-11-20 NOTE — Telephone Encounter (Signed)
Phone call to Medicaid.  Started Prior Authorization process.  Case # [id number].  Recommended calling back 11/21/12 to find out decision by pharmacist.

## 2012-11-25 ENCOUNTER — Telehealth: Payer: Self-pay | Admitting: *Deleted

## 2012-11-25 NOTE — Telephone Encounter (Signed)
Marinol rx denied.  Considered an anti-nausea medication and the pt has not tried other "preferred" anti-nausea medications.  Pt prescribed the medication to increase appetite not nausea.  Letter being sent by Medicaid stating denial and the appeal process.

## 2012-11-26 NOTE — Telephone Encounter (Signed)
Correction:  Marinol needing Prior Authorization.  Medicaid denied the authorization.  Sending paperwork for MD to appeal decision.

## 2012-11-26 NOTE — Telephone Encounter (Signed)
Let me know what we need to do next for her

## 2012-12-02 NOTE — Telephone Encounter (Signed)
Angelique Blonder, do u mind reminding me to do the paperwork for the prior auth tomorrow

## 2012-12-04 ENCOUNTER — Other Ambulatory Visit: Payer: Self-pay | Admitting: *Deleted

## 2012-12-04 NOTE — Telephone Encounter (Signed)
Opened in error

## 2012-12-16 ENCOUNTER — Other Ambulatory Visit: Payer: Self-pay | Admitting: Internal Medicine

## 2012-12-16 ENCOUNTER — Telehealth: Payer: Self-pay | Admitting: *Deleted

## 2012-12-16 NOTE — Telephone Encounter (Signed)
Patient called to report a rash on her legs after starting Chantix. Advised to take Benadryl for the itching and to stop the Chantix.  She has stopped it and reported that she has also stopped smoking. Chantix added to patient's allergy list. Wendall Mola CMA

## 2012-12-19 ENCOUNTER — Other Ambulatory Visit: Payer: Self-pay | Admitting: Licensed Clinical Social Worker

## 2012-12-19 DIAGNOSIS — K219 Gastro-esophageal reflux disease without esophagitis: Secondary | ICD-10-CM

## 2012-12-19 MED ORDER — OMEPRAZOLE 20 MG PO CPDR: 20.0000 mg | DELAYED_RELEASE_CAPSULE | Freq: Every day | ORAL | Status: DC

## 2013-01-22 ENCOUNTER — Other Ambulatory Visit: Payer: Self-pay | Admitting: Internal Medicine

## 2013-01-22 DIAGNOSIS — Z1231 Encounter for screening mammogram for malignant neoplasm of breast: Secondary | ICD-10-CM

## 2013-02-12 ENCOUNTER — Telehealth: Payer: Self-pay | Admitting: *Deleted

## 2013-02-12 NOTE — Telephone Encounter (Signed)
Message left.  Pt needs annual PAP smear appt.

## 2013-02-17 ENCOUNTER — Other Ambulatory Visit (HOSPITAL_COMMUNITY)
Admission: RE | Admit: 2013-02-17 | Discharge: 2013-02-17 | Disposition: A | Discharge status: Home / Self Care | Payer: Medicaid Other | Source: Ambulatory Visit | Attending: Infectious Diseases | Admitting: Infectious Diseases

## 2013-02-17 ENCOUNTER — Other Ambulatory Visit: Payer: Medicaid Other

## 2013-02-17 ENCOUNTER — Other Ambulatory Visit: Payer: Self-pay | Admitting: Internal Medicine

## 2013-02-17 ENCOUNTER — Ambulatory Visit (INDEPENDENT_AMBULATORY_CARE_PROVIDER_SITE_OTHER): Payer: Medicaid Other | Admitting: *Deleted

## 2013-02-17 ENCOUNTER — Other Ambulatory Visit: Payer: Self-pay | Admitting: Infectious Diseases

## 2013-02-17 DIAGNOSIS — Z113 Encounter for screening for infections with a predominantly sexual mode of transmission: Secondary | ICD-10-CM | POA: Insufficient documentation

## 2013-02-17 DIAGNOSIS — I10 Essential (primary) hypertension: Secondary | ICD-10-CM

## 2013-02-17 DIAGNOSIS — Z79899 Other long term (current) drug therapy: Secondary | ICD-10-CM

## 2013-02-17 DIAGNOSIS — N760 Acute vaginitis: Secondary | ICD-10-CM | POA: Insufficient documentation

## 2013-02-17 DIAGNOSIS — Z01419 Encounter for gynecological examination (general) (routine) without abnormal findings: Secondary | ICD-10-CM | POA: Insufficient documentation

## 2013-02-17 DIAGNOSIS — B20 Human immunodeficiency virus [HIV] disease: Secondary | ICD-10-CM

## 2013-02-17 DIAGNOSIS — Z124 Encounter for screening for malignant neoplasm of cervix: Secondary | ICD-10-CM

## 2013-02-17 LAB — COMPREHENSIVE METABOLIC PANEL
ALT: <8 U/L (ref 0–35)
AST: 18 U/L (ref 0–37)
Albumin: 4.2 g/dL (ref 3.5–5.2)
Alkaline Phosphatase: 69 U/L (ref 39–117)
BUN: 18 mg/dL (ref 6–23)
CO2: 28 mEq/L (ref 19–32)
Calcium: 9.7 mg/dL (ref 8.4–10.5)
Chloride: 105 mEq/L (ref 96–112)
Creat: 1.08 mg/dL (ref 0.50–1.10)
Glucose, Bld: 90 mg/dL (ref 70–99)
Potassium: 4.7 mEq/L (ref 3.5–5.3)
Sodium: 141 mEq/L (ref 135–145)
Total Bilirubin: 0.3 mg/dL (ref 0.3–1.2)
Total Protein: 6.9 g/dL (ref 6.0–8.3)

## 2013-02-17 LAB — LIPID PANEL
Cholesterol: 198 mg/dL (ref 0–200)
HDL: 68 mg/dL (ref >39)
LDL Cholesterol: 116 mg/dL — ABNORMAL HIGH (ref 0–99)
Total CHOL/HDL Ratio: 2.9 Ratio
Triglycerides: 70 mg/dL (ref <150)
VLDL: 14 mg/dL (ref 0–40)

## 2013-02-17 LAB — CBC WITH DIFFERENTIAL/PLATELET
Basophils Absolute: 0 10*3/uL (ref 0.0–0.1)
Basophils Relative: 0 % (ref 0–1)
Eosinophils Absolute: 0.1 10*3/uL (ref 0.0–0.7)
Eosinophils Relative: 2 % (ref 0–5)
HCT: 29 % — ABNORMAL LOW (ref 36.0–46.0)
Hemoglobin: 9.8 g/dL — ABNORMAL LOW (ref 12.0–15.0)
Lymphocytes Relative: 53 % — ABNORMAL HIGH (ref 12–46)
Lymphs Abs: 2.4 10*3/uL (ref 0.7–4.0)
MCH: 32.9 pg (ref 26.0–34.0)
MCHC: 33.8 g/dL (ref 30.0–36.0)
MCV: 97.3 fL (ref 78.0–100.0)
Monocytes Absolute: 0.3 10*3/uL (ref 0.1–1.0)
Monocytes Relative: 7 % (ref 3–12)
Neutro Abs: 1.7 10*3/uL (ref 1.7–7.7)
Neutrophils Relative %: 38 % — ABNORMAL LOW (ref 43–77)
Platelets: 328 10*3/uL (ref 150–400)
RBC: 2.98 MIL/uL — ABNORMAL LOW (ref 3.87–5.11)
RDW: 13.6 % (ref 11.5–15.5)
WBC: 4.5 10*3/uL (ref 4.0–10.5)

## 2013-02-17 LAB — RPR

## 2013-02-17 MED ORDER — AMLODIPINE BESYLATE 10 MG PO TABS: 10.0000 mg | ORAL_TABLET | Freq: Every day | ORAL | Status: DC

## 2013-02-17 NOTE — Progress Notes (Signed)
  Subjective:     Janice Williams is a 52 y.o. woman who comes in today for a  pap smear only.  Previous abnormal Pap smears: no. Contraception: condoms  Pelvic Exam:  Pap smear obtained.   Assessment:    Screening pap smear.  Pt very tender when obtaining PAP smear.  Creamy white vaginal discharge.  Vaginal pap smear only due to tenderness.  Plan:    Follow up in one year, or as indicated by Pap results.  Pt given educational materials re:  HIV and heart disease, BSE, partner protection, PAP smears, ACA and medication adherence.  Pt given condoms.

## 2013-02-17 NOTE — Patient Instructions (Signed)
Your results will be ready in about a week.  I will mail them to you.  Thank you for coming to the Center for your care.  Lener Ventresca,  RN 

## 2013-02-18 LAB — HIV-1 RNA QUANT-NO REFLEX-BLD
HIV 1 RNA Quant: 23 copies/mL — ABNORMAL HIGH (ref <20)
HIV-1 RNA Quant, Log: 1.36 {Log} — ABNORMAL HIGH (ref <1.30)

## 2013-02-18 LAB — T-HELPER CELL (CD4) - (RCID CLINIC ONLY)
CD4 % Helper T Cell: 31 % — ABNORMAL LOW (ref 33–55)
CD4 T Cell Abs: 760 uL (ref 400–2700)

## 2013-02-20 ENCOUNTER — Other Ambulatory Visit: Payer: Self-pay | Admitting: *Deleted

## 2013-02-20 ENCOUNTER — Telehealth: Payer: Self-pay | Admitting: *Deleted

## 2013-02-20 DIAGNOSIS — A599 Trichomoniasis, unspecified: Secondary | ICD-10-CM

## 2013-02-20 MED ORDER — METRONIDAZOLE 500 MG PO TABS: 500.0000 mg | ORAL_TABLET | Freq: Two times a day (BID) | ORAL | Status: DC

## 2013-02-20 NOTE — Telephone Encounter (Signed)
Patient called and notified of the trichomonas found on her last PAP smear.  RN offered patient choice of metronidazole 2gm Once, or metronidazole 500mg  BID for 7 days.  Patient chose the 7 day treatment and asked for it to be called in to her pharmacy.  Done. Andree Coss, RN

## 2013-02-21 ENCOUNTER — Ambulatory Visit: Payer: Medicaid Other

## 2013-03-03 ENCOUNTER — Ambulatory Visit (INDEPENDENT_AMBULATORY_CARE_PROVIDER_SITE_OTHER): Payer: Medicaid Other | Admitting: Internal Medicine

## 2013-03-03 ENCOUNTER — Encounter: Payer: Self-pay | Admitting: Internal Medicine

## 2013-03-03 VITALS — BP 107/68 | HR 79 | Temp 98.5°F | Ht 65.0 in | Wt 106.0 lb

## 2013-03-03 DIAGNOSIS — M170 Bilateral primary osteoarthritis of knee: Secondary | ICD-10-CM

## 2013-03-03 DIAGNOSIS — B20 Human immunodeficiency virus [HIV] disease: Secondary | ICD-10-CM

## 2013-03-03 DIAGNOSIS — M1710 Unilateral primary osteoarthritis, unspecified knee: Secondary | ICD-10-CM

## 2013-03-03 DIAGNOSIS — IMO0002 Reserved for concepts with insufficient information to code with codable children: Secondary | ICD-10-CM

## 2013-03-03 DIAGNOSIS — E441 Mild protein-calorie malnutrition: Secondary | ICD-10-CM

## 2013-03-03 NOTE — Progress Notes (Signed)
[facility] HIV CLINIC NOTE  RFV: routine visit Subjective:    Patient ID: Janice Williams, female    DOB: April 04, 1961, 51 y.o.   MRN: [id number]  HPI CD 4 count of 760/VL 23, (02/17/13) on Atripla, doing well. VL improved since dec where VL 230s. Doing well with adherence. Had pap smear in late February found to have trichomonas. Treated with 7 days of metronidazole. She reports no recent sexual contact.   She has stopped smoking with help of chantix. She took it for a week but had bad side effects of nightmares and hives. Now feeling better with less itching, previously treated with benadryl. She now has better appetite, taste foods now and now gained 8 lbs.   Hx of abn mammo, las 02/2012;   Review of Systems Notices having bilateral knee pain. Radiating down her leg. Had this in the past, received prednisone in the past. Has not taking any otc for it.    Objective:   Physical Exam BP 107/68  Pulse 79  Temp(Src) 98.5 F (36.9 C) (Oral)  Ht 5\' 5"  (1.651 m)  Wt 106 lb (48.081 kg)  BMI 17.64 kg/m2 Physical Exam  Constitutional: oriented to person, place, and time.  appears well-developed and well-nourished. No distress.  HENT:  Mouth/Throat: Oropharynx is clear and moist. No oropharyngeal exudate.  Cardiovascular: Normal rate, regular rhythm and normal heart sounds. Exam reveals no gallop and no friction rub.  No murmur heard.  Pulmonary/Chest: Effort normal and breath sounds normal. No respiratory distress.  no wheezes.  Lymphadenopathy:  no cervical adenopathy.  Skin: Skin is warm and dry. No rash noted. No erythema.        Assessment & Plan:   HIV = continue with atripla  HLD= at target, continue with lipitor  Health maintenance= will need to schedule mammo  (april 16th) and colonoscopy, maybe later in the year, will discuss in 3 months. uptodate on vaccines but will check hep b s ab at next  Blood draw to see if need to revaccinate  Osteoarthritis = will do a trial of alleve vs.  Tylenol to see if helps her out.

## 2013-04-02 ENCOUNTER — Ambulatory Visit
Admission: RE | Admit: 2013-04-02 | Discharge: 2013-04-02 | Disposition: A | Discharge status: Home / Self Care | Payer: Medicaid Other | Source: Ambulatory Visit | Attending: Internal Medicine | Admitting: Internal Medicine

## 2013-04-02 DIAGNOSIS — Z1231 Encounter for screening mammogram for malignant neoplasm of breast: Secondary | ICD-10-CM

## 2013-04-02 IMAGING — MG MM DIGITAL SCREENING BILAT
4 series · 4 of 4 positions shown · non-contrast
Comparison: [DATE]

CLINICAL DATA: Screening.

DIGITAL BILATERAL SCREENING MAMMOGRAM WITH CAD

[R CC]
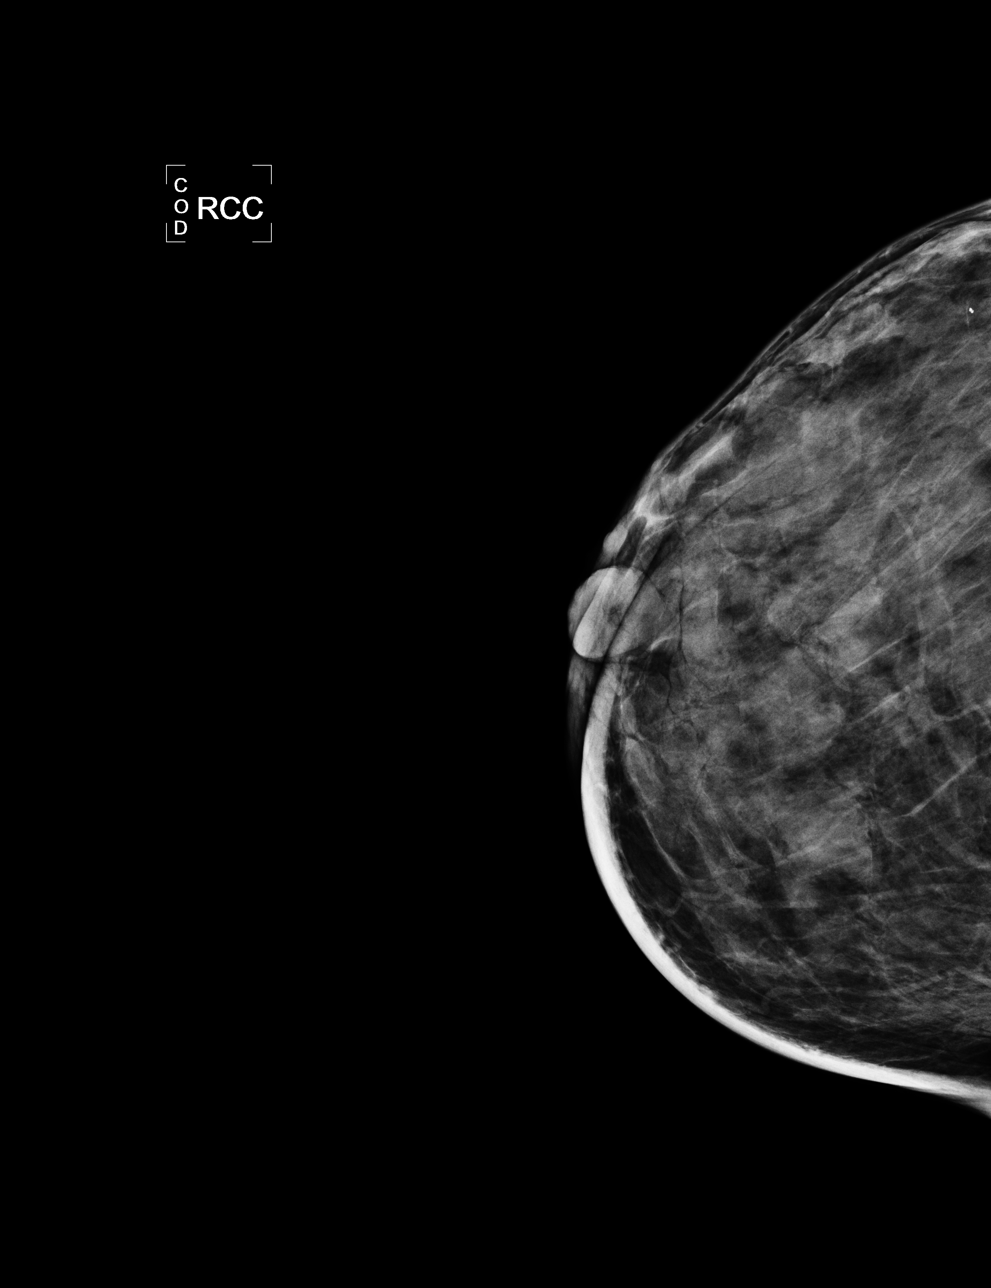

[L CC]
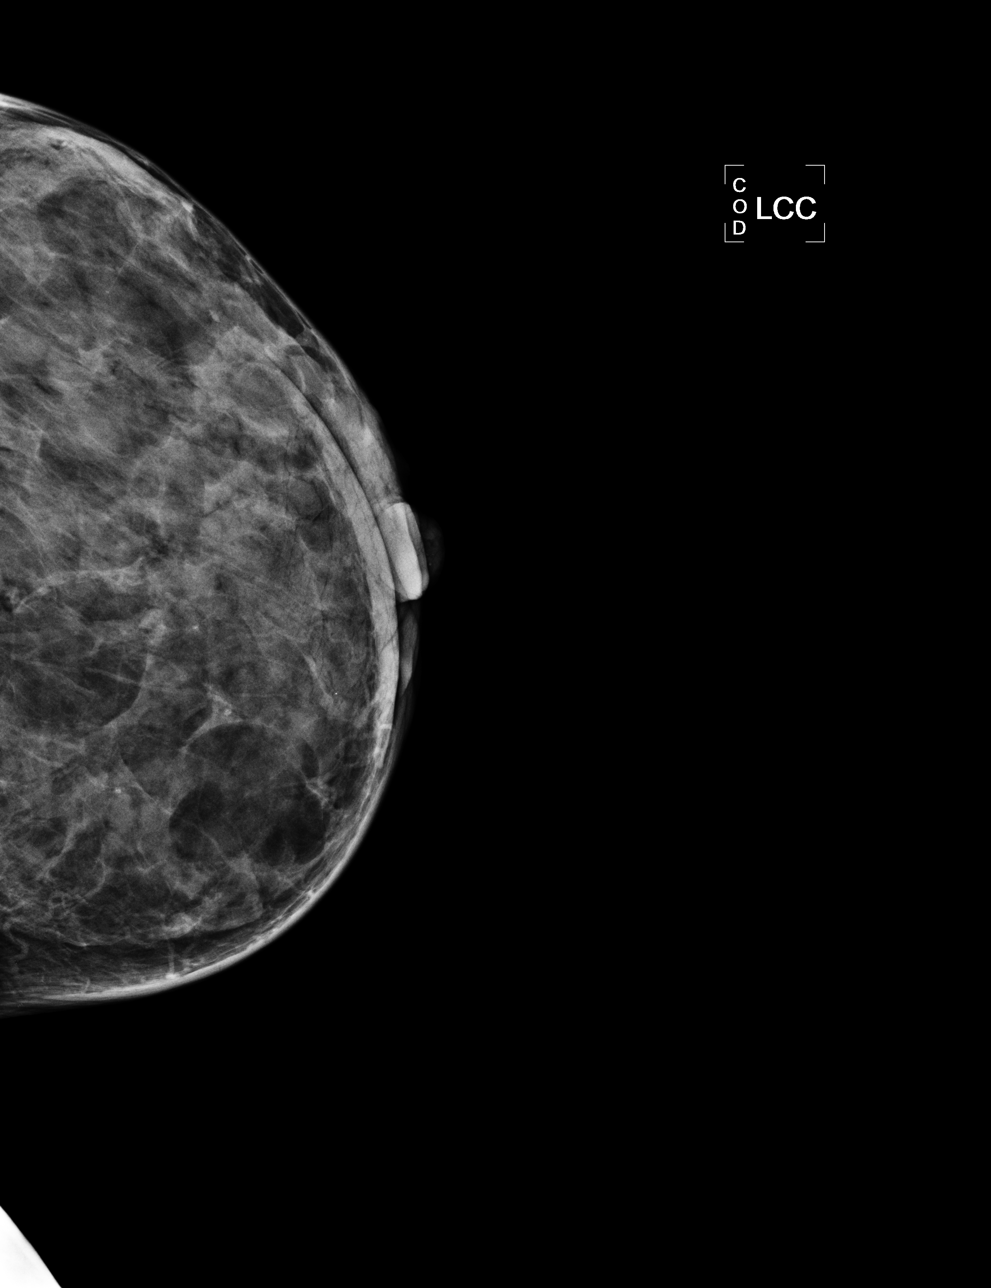

[L MLO]
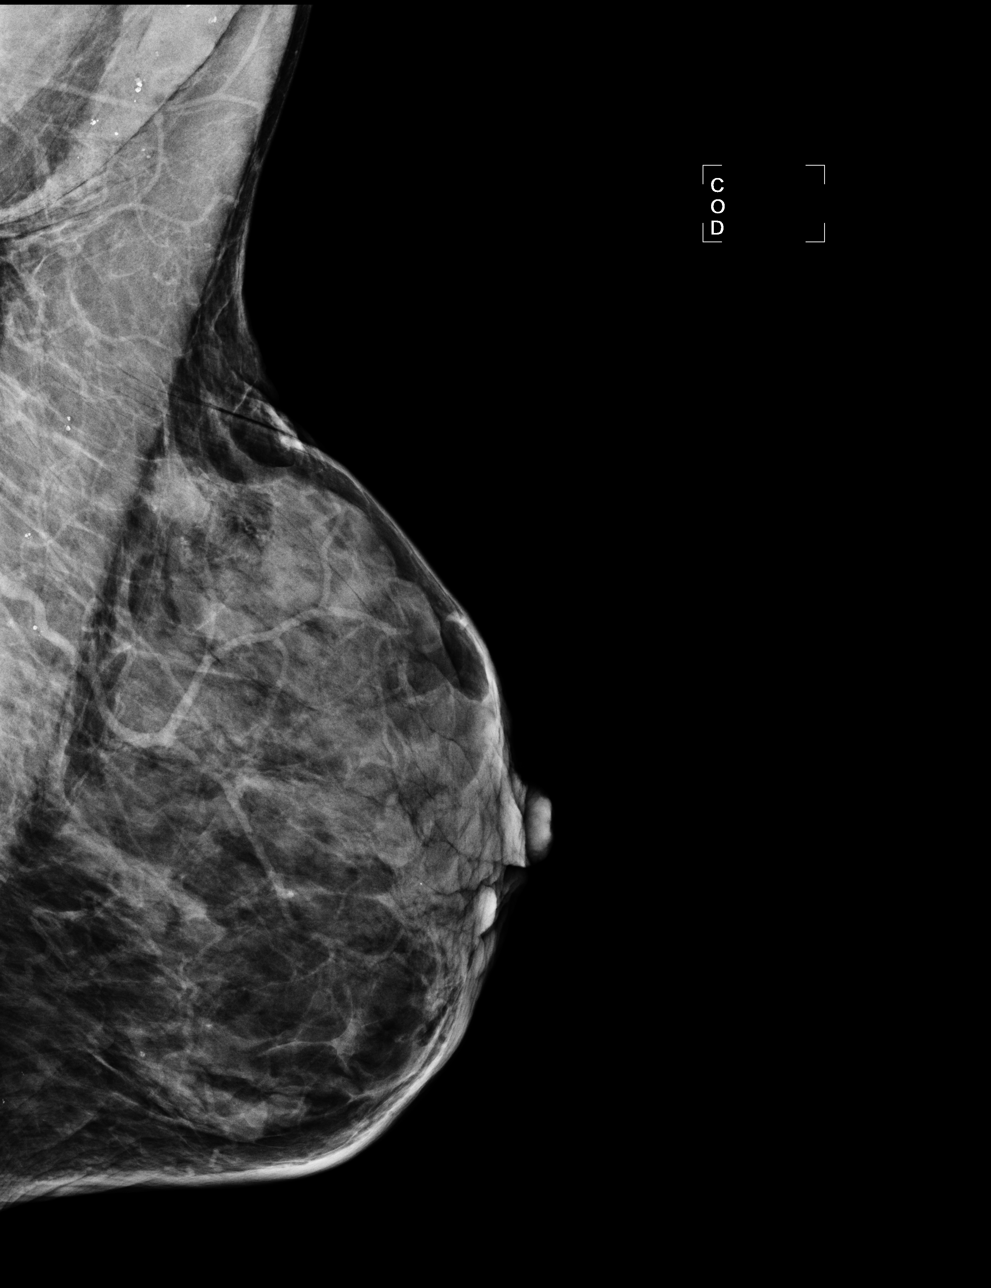

[R MLO]
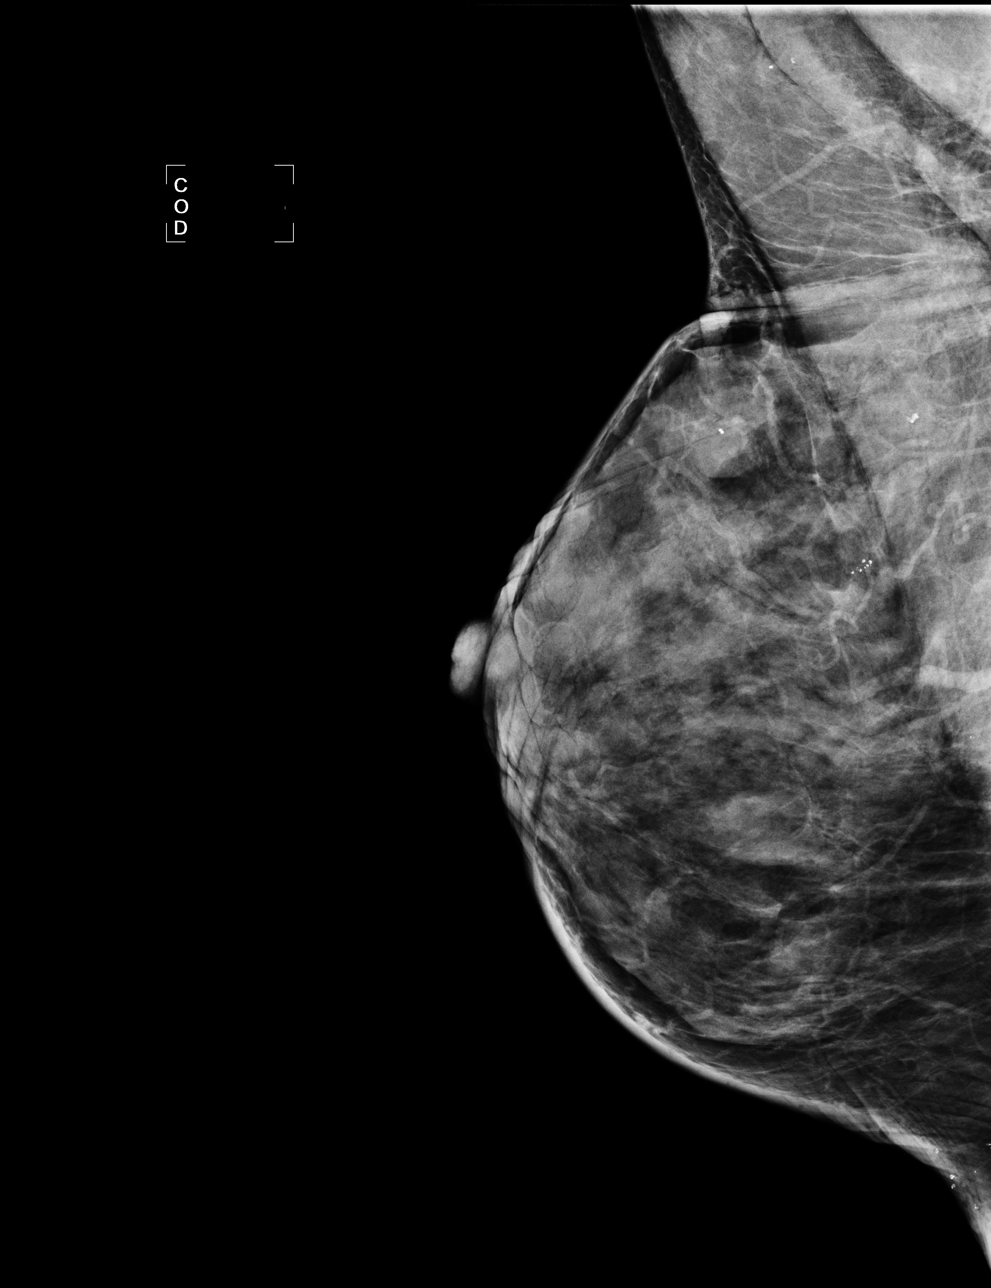

[4 of 4 positions shown; findings below may reference images not displayed]

FINDINGS: ACR Breast Density Category 4: The breast tissue is extremely
dense.

There is no suspicious dominant mass, architectural distortion, or
calcification to suggest malignancy. Previously noted benign nodule
in the left upper outer quadrant posteriorly is stable.  There is a
degenerating fibroadenoma in the right upper outer quadrant.

Images were processed with CAD.
IMPRESSION: No mammographic evidence of malignancy.

A result letter of this screening mammogram will be mailed directly
to the patient.

RECOMMENDATION:
Screening mammogram in one year. (Code:[ZU])

BI-RADS CATEGORY 1:  Negative.

## 2013-04-07 ENCOUNTER — Encounter: Payer: Self-pay | Admitting: Licensed Clinical Social Worker

## 2013-04-07 ENCOUNTER — Other Ambulatory Visit: Payer: Self-pay | Admitting: Licensed Clinical Social Worker

## 2013-04-07 DIAGNOSIS — K219 Gastro-esophageal reflux disease without esophagitis: Secondary | ICD-10-CM

## 2013-04-07 MED ORDER — OMEPRAZOLE 20 MG PO CPDR: 20.0000 mg | DELAYED_RELEASE_CAPSULE | Freq: Every day | ORAL | Status: DC

## 2013-04-11 ENCOUNTER — Other Ambulatory Visit: Payer: Self-pay | Admitting: *Deleted

## 2013-04-11 DIAGNOSIS — K219 Gastro-esophageal reflux disease without esophagitis: Secondary | ICD-10-CM

## 2013-04-11 MED ORDER — OMEPRAZOLE 20 MG PO CPDR: 20.0000 mg | DELAYED_RELEASE_CAPSULE | Freq: Every day | ORAL | Status: DC

## 2013-05-02 ENCOUNTER — Telehealth: Payer: Self-pay | Admitting: *Deleted

## 2013-05-02 DIAGNOSIS — R634 Abnormal weight loss: Secondary | ICD-10-CM

## 2013-05-02 MED ORDER — ENSURE PO LIQD: 1.0000 | Freq: Three times a day (TID) | ORAL | Status: DC

## 2013-05-02 NOTE — Telephone Encounter (Signed)
Patient called and advised she needs a Rx for Ensure to be sent to [facility] so she can get her milk on Monday.

## 2013-05-26 ENCOUNTER — Other Ambulatory Visit: Payer: Self-pay | Admitting: *Deleted

## 2013-05-26 DIAGNOSIS — R634 Abnormal weight loss: Secondary | ICD-10-CM

## 2013-05-26 DIAGNOSIS — B20 Human immunodeficiency virus [HIV] disease: Secondary | ICD-10-CM

## 2013-05-26 MED ORDER — EFAVIRENZ-EMTRICITAB-TENOFOVIR 600-200-300 MG PO TABS: ORAL_TABLET | ORAL | Status: DC

## 2013-05-26 MED ORDER — ENSURE PO LIQD: 1.0000 | Freq: Three times a day (TID) | ORAL | Status: DC

## 2013-05-28 ENCOUNTER — Other Ambulatory Visit: Payer: Self-pay | Admitting: Licensed Clinical Social Worker

## 2013-05-28 DIAGNOSIS — B20 Human immunodeficiency virus [HIV] disease: Secondary | ICD-10-CM

## 2013-05-28 MED ORDER — EFAVIRENZ-EMTRICITAB-TENOFOVIR 600-200-300 MG PO TABS: ORAL_TABLET | ORAL | Status: DC

## 2013-06-02 ENCOUNTER — Other Ambulatory Visit: Payer: Medicaid Other

## 2013-06-02 DIAGNOSIS — B20 Human immunodeficiency virus [HIV] disease: Secondary | ICD-10-CM

## 2013-06-02 LAB — CBC WITH DIFFERENTIAL/PLATELET
Basophils Absolute: 0 10*3/uL (ref 0.0–0.1)
Basophils Relative: 0 % (ref 0–1)
Eosinophils Absolute: 0.1 10*3/uL (ref 0.0–0.7)
Eosinophils Relative: 1 % (ref 0–5)
HCT: 31.6 % — ABNORMAL LOW (ref 36.0–46.0)
Hemoglobin: 10.7 g/dL — ABNORMAL LOW (ref 12.0–15.0)
Lymphocytes Relative: 53 % — ABNORMAL HIGH (ref 12–46)
Lymphs Abs: 2.6 10*3/uL (ref 0.7–4.0)
MCH: 32.5 pg (ref 26.0–34.0)
MCHC: 33.9 g/dL (ref 30.0–36.0)
MCV: 96 fL (ref 78.0–100.0)
Monocytes Absolute: 0.3 10*3/uL (ref 0.1–1.0)
Monocytes Relative: 6 % (ref 3–12)
Neutro Abs: 1.9 10*3/uL (ref 1.7–7.7)
Neutrophils Relative %: 40 % — ABNORMAL LOW (ref 43–77)
Platelets: 301 10*3/uL (ref 150–400)
RBC: 3.29 MIL/uL — ABNORMAL LOW (ref 3.87–5.11)
RDW: 14.5 % (ref 11.5–15.5)
WBC: 4.9 10*3/uL (ref 4.0–10.5)

## 2013-06-02 NOTE — Addendum Note (Signed)
Addended bySteva Colder on: 06/02/2013 02:46 PM   Modules accepted: Orders

## 2013-06-03 LAB — T-HELPER CELL (CD4) - (RCID CLINIC ONLY)
CD4 % Helper T Cell: 32 % — ABNORMAL LOW (ref 33–55)
CD4 T Cell Abs: 870 uL (ref 400–2700)

## 2013-06-03 LAB — COMPREHENSIVE METABOLIC PANEL
ALT: 9 U/L (ref 0–35)
AST: 21 U/L (ref 0–37)
Albumin: 4.2 g/dL (ref 3.5–5.2)
Alkaline Phosphatase: 76 U/L (ref 39–117)
BUN: 25 mg/dL — ABNORMAL HIGH (ref 6–23)
CO2: 26 mEq/L (ref 19–32)
Calcium: 9.7 mg/dL (ref 8.4–10.5)
Chloride: 105 mEq/L (ref 96–112)
Creat: 1.21 mg/dL — ABNORMAL HIGH (ref 0.50–1.10)
Glucose, Bld: 100 mg/dL — ABNORMAL HIGH (ref 70–99)
Potassium: 5.4 mEq/L — ABNORMAL HIGH (ref 3.5–5.3)
Sodium: 139 mEq/L (ref 135–145)
Total Bilirubin: 0.3 mg/dL (ref 0.3–1.2)
Total Protein: 7.1 g/dL (ref 6.0–8.3)

## 2013-06-03 LAB — HIV-1 RNA QUANT-NO REFLEX-BLD
HIV 1 RNA Quant: 89 copies/mL — ABNORMAL HIGH (ref <20)
HIV-1 RNA Quant, Log: 1.95 {Log} — ABNORMAL HIGH (ref <1.30)

## 2013-06-17 ENCOUNTER — Ambulatory Visit (INDEPENDENT_AMBULATORY_CARE_PROVIDER_SITE_OTHER): Payer: Medicaid Other | Admitting: Internal Medicine

## 2013-06-17 ENCOUNTER — Encounter: Payer: Self-pay | Admitting: Internal Medicine

## 2013-06-17 VITALS — BP 116/77 | HR 71 | Temp 98.3°F | Wt 102.0 lb

## 2013-06-17 DIAGNOSIS — B20 Human immunodeficiency virus [HIV] disease: Secondary | ICD-10-CM

## 2013-06-17 MED ORDER — MEGESTROL ACETATE 40 MG PO TABS: 160.0000 mg/d | ORAL_TABLET | Freq: Two times a day (BID) | ORAL | Status: DC

## 2013-06-17 NOTE — Progress Notes (Signed)
[facility] HIV CLINIC NOTE  RFV: routine Subjective:    Patient ID: Janice Williams, female    DOB: 11-06-1961, 52 y.o.   MRN: [id number]  HPI 51yo F, HIV with CD 4 count of 870 (32%)/VL 89 on atripla. No missing doses since last visit.  Stopped smoking in January. Has had lack of appetite mostly at dinner time. Feeling fine otherwise. 10 lb gain since last visit. Denies fevers, chills, nightsweats.  Current Outpatient Prescriptions on File Prior to Visit  Medication Sig Dispense Refill  . amLODipine (NORVASC) 10 MG tablet Take 1 tablet (10 mg total) by mouth daily.  30 tablet  6  . atorvastatin (LIPITOR) 40 MG tablet Take 1 tablet (40 mg total) by mouth daily.  30 tablet  11  . efavirenz-emtricitabine-tenofovir (ATRIPLA) 600-200-300 MG per tablet Take 1 tablet by mouth at bedtime.  30 tablet  3  . ENSURE (ENSURE) Take 1 Can by mouth 3 (three) times daily between meals.  237 mL  11  . omeprazole (PRILOSEC) 20 MG capsule Take 1 capsule (20 mg total) by mouth daily.  30 capsule  2   No current facility-administered medications on file prior to visit.   Active Ambulatory Problems    Diagnosis Date Noted  . Human Immunodeficiency Virus (HIV) Disease 10/29/2008  . HYPERLIPIDEMIA 06/03/2009  . ANEMIA-NOS 10/29/2008  . TOBACCO DEPENDENCE 02/14/2007  . PERIPHERAL NEUROPATHY 10/29/2008  . EXTERNAL OTITIS 01/06/2010  . CVA 02/14/2007  . ACUTE SINUSITIS, UNSPECIFIED 01/20/2010  . RHINITIS, ALLERGIC 02/14/2007  . GERD 10/29/2008  . DYSPEPSIA 02/14/2007  . RENAL INSUFFICIENCY 10/29/2008  . CARBUNCLE AND FURUNCLE OF FACE 02/04/2009  . ACNE 02/14/2007  . FACIAL RASH 02/04/2009  . WEIGHT LOSS, ABNORMAL 02/14/2007  . WEIGHT LOSS 06/03/2009  . CEREBROVASCULAR ACCIDENT, HX OF 10/29/2008  . EPIDERMOID CYST 02/10/2011  . Dyslipidemia 07/06/2011  . Routine health maintenance 07/06/2011  . Acne cystica 12/22/2011   Resolved Ambulatory Problems    Diagnosis Date Noted  . No Resolved Ambulatory  Problems   Past Medical History  Diagnosis Date  . HIV infection   . Substance abuse      Review of Systems  Constitutional: Negative for fever, chills, diaphoresis, activity change, appetite change, fatigue and unexpected weight change.  HENT: Negative for congestion, sore throat, rhinorrhea, sneezing, trouble swallowing and sinus pressure.  Eyes: Negative for photophobia and visual disturbance.  Respiratory: Negative for cough, chest tightness, shortness of breath, wheezing and stridor.  Cardiovascular: Negative for chest pain, palpitations and leg swelling.  Gastrointestinal: Negative for nausea, vomiting, abdominal pain, diarrhea, constipation, blood in stool, abdominal distention and anal bleeding.  Genitourinary: Negative for dysuria, hematuria, flank pain and difficulty urinating.  Musculoskeletal: Negative for myalgias, back pain, joint swelling, arthralgias and gait problem.  Skin: Negative for color change, pallor, rash and wound.  Neurological: Negative for dizziness, tremors, weakness and light-headedness.  Hematological: Negative for adenopathy. Does not bruise/bleed easily.  Psychiatric/Behavioral: Negative for behavioral problems, confusion, sleep disturbance, dysphoric mood, decreased concentration and agitation.       Objective:   Physical Exam BP 116/77  Pulse 71  Temp(Src) 98.3 F (36.8 C) (Oral)  Wt 102 lb (46.267 kg)  BMI 16.97 kg/m2 Physical Exam  Constitutional:  oriented to person, place, and time.  appears well-developed and well-nourished. No distress.  HENT:  Mouth/Throat: Oropharynx is clear and moist. No oropharyngeal exudate.  Cardiovascular: Normal rate, regular rhythm and normal heart sounds. Exam reveals no gallop and no friction rub.  No murmur heard.  Pulmonary/Chest: Effort normal and breath sounds normal. No respiratory distress.no wheezes.  Lymphadenopathy:  no cervical adenopathy.  Neurological: He is alert and oriented to person,  place, and time.  Skin: Skin is warm and dry. No rash noted. No erythema.  Psychiatric: He has a normal mood and affect. His behavior is normal.        Assessment & Plan:   hiv = continue with atripla  Appetite stimulant/ underweight = megace trial for 3 months  rtc in 3 months

## 2013-07-08 ENCOUNTER — Other Ambulatory Visit: Payer: Self-pay | Admitting: *Deleted

## 2013-07-08 DIAGNOSIS — K219 Gastro-esophageal reflux disease without esophagitis: Secondary | ICD-10-CM

## 2013-07-08 MED ORDER — OMEPRAZOLE 20 MG PO CPDR: 20.0000 mg | DELAYED_RELEASE_CAPSULE | Freq: Every day | ORAL | Status: DC

## 2013-08-04 ENCOUNTER — Telehealth: Payer: Self-pay | Admitting: Licensed Clinical Social Worker

## 2013-08-04 NOTE — Telephone Encounter (Signed)
Patient called upset that she has been without ensure for almost a month. She admits to being confused on how to pick up and call in the order on Mondays at [facility]. She called this morning and was told that she could not pick up the ensure until next month. I advised to her that we can't make [facility] give ensure because that is a program that they offer, but I could call on her behalf to see what happened and if they would make an exception if possible. I left a message on Amy Faw voicemail.

## 2013-09-18 ENCOUNTER — Other Ambulatory Visit: Payer: Self-pay | Admitting: *Deleted

## 2013-09-18 DIAGNOSIS — I10 Essential (primary) hypertension: Secondary | ICD-10-CM

## 2013-09-18 MED ORDER — AMLODIPINE BESYLATE 10 MG PO TABS: 10.0000 mg | ORAL_TABLET | Freq: Every day | ORAL | Status: DC

## 2013-09-29 ENCOUNTER — Other Ambulatory Visit: Payer: Medicaid Other

## 2013-09-29 DIAGNOSIS — B20 Human immunodeficiency virus [HIV] disease: Secondary | ICD-10-CM

## 2013-09-29 LAB — CBC WITH DIFFERENTIAL/PLATELET
Basophils Absolute: 0 10*3/uL (ref 0.0–0.1)
Basophils Relative: 0 % (ref 0–1)
Eosinophils Absolute: 0.1 10*3/uL (ref 0.0–0.7)
Eosinophils Relative: 1 % (ref 0–5)
HCT: 30.7 % — ABNORMAL LOW (ref 36.0–46.0)
Hemoglobin: 10.7 g/dL — ABNORMAL LOW (ref 12.0–15.0)
Lymphocytes Relative: 53 % — ABNORMAL HIGH (ref 12–46)
Lymphs Abs: 2.9 10*3/uL (ref 0.7–4.0)
MCH: 33.3 pg (ref 26.0–34.0)
MCHC: 34.9 g/dL (ref 30.0–36.0)
MCV: 95.6 fL (ref 78.0–100.0)
Monocytes Absolute: 0.3 10*3/uL (ref 0.1–1.0)
Monocytes Relative: 6 % (ref 3–12)
Neutro Abs: 2.2 10*3/uL (ref 1.7–7.7)
Neutrophils Relative %: 40 % — ABNORMAL LOW (ref 43–77)
Platelets: 346 10*3/uL (ref 150–400)
RBC: 3.21 MIL/uL — ABNORMAL LOW (ref 3.87–5.11)
RDW: 13.7 % (ref 11.5–15.5)
WBC: 5.5 10*3/uL (ref 4.0–10.5)

## 2013-09-29 LAB — COMPREHENSIVE METABOLIC PANEL
ALT: 10 U/L (ref 0–35)
AST: 21 U/L (ref 0–37)
Albumin: 4.3 g/dL (ref 3.5–5.2)
Alkaline Phosphatase: 70 U/L (ref 39–117)
BUN: 26 mg/dL — ABNORMAL HIGH (ref 6–23)
CO2: 25 mEq/L (ref 19–32)
Calcium: 9.6 mg/dL (ref 8.4–10.5)
Chloride: 106 mEq/L (ref 96–112)
Creat: 1.47 mg/dL — ABNORMAL HIGH (ref 0.50–1.10)
Glucose, Bld: 80 mg/dL (ref 70–99)
Potassium: 4.8 mEq/L (ref 3.5–5.3)
Sodium: 139 mEq/L (ref 135–145)
Total Bilirubin: 0.4 mg/dL (ref 0.3–1.2)
Total Protein: 7.2 g/dL (ref 6.0–8.3)

## 2013-09-30 LAB — T-HELPER CELL (CD4) - (RCID CLINIC ONLY)
CD4 % Helper T Cell: 30 % — ABNORMAL LOW (ref 33–55)
CD4 T Cell Abs: 860 /uL (ref 400–2700)

## 2013-09-30 LAB — HIV-1 RNA QUANT-NO REFLEX-BLD
HIV 1 RNA Quant: <20 copies/mL (ref <20)
HIV-1 RNA Quant, Log: <1.3 {Log} (ref <1.30)

## 2013-10-13 ENCOUNTER — Ambulatory Visit: Payer: Medicaid Other | Admitting: Internal Medicine

## 2013-10-13 ENCOUNTER — Other Ambulatory Visit: Payer: Self-pay | Admitting: Licensed Clinical Social Worker

## 2013-10-13 DIAGNOSIS — K219 Gastro-esophageal reflux disease without esophagitis: Secondary | ICD-10-CM

## 2013-10-13 MED ORDER — OMEPRAZOLE 20 MG PO CPDR: 20.0000 mg | DELAYED_RELEASE_CAPSULE | Freq: Every day | ORAL | Status: DC

## 2013-10-15 ENCOUNTER — Ambulatory Visit: Payer: Medicaid Other | Admitting: Internal Medicine

## 2013-10-21 ENCOUNTER — Encounter: Payer: Self-pay | Admitting: Internal Medicine

## 2013-10-21 ENCOUNTER — Ambulatory Visit (INDEPENDENT_AMBULATORY_CARE_PROVIDER_SITE_OTHER): Payer: Medicaid Other | Admitting: Internal Medicine

## 2013-10-21 VITALS — BP 135/83 | HR 90 | Temp 98.3°F | Ht 63.0 in | Wt 101.5 lb

## 2013-10-21 DIAGNOSIS — B20 Human immunodeficiency virus [HIV] disease: Secondary | ICD-10-CM

## 2013-10-21 DIAGNOSIS — Z23 Encounter for immunization: Secondary | ICD-10-CM

## 2013-10-21 NOTE — Addendum Note (Signed)
Addended by: Jennet Maduro D on: 10/21/2013 09:45 AM   Modules accepted: Orders

## 2013-10-21 NOTE — Progress Notes (Signed)
Patient ID: Janice Williams, female   DOB: Oct 07, 1961, 52 y.o.   MRN: [id number]          [facility] for Infectious Disease  Patient Active Problem List   Diagnosis Date Noted  . Acne cystica 12/22/2011  . Dyslipidemia 07/06/2011  . Routine health maintenance 07/06/2011  . EPIDERMOID CYST 02/10/2011  . ACUTE SINUSITIS, UNSPECIFIED 01/20/2010  . EXTERNAL OTITIS 01/06/2010  . HYPERLIPIDEMIA 06/03/2009  . WEIGHT LOSS 06/03/2009  . CARBUNCLE AND FURUNCLE OF FACE 02/04/2009  . FACIAL RASH 02/04/2009  . Human Immunodeficiency Virus (HIV) Disease 10/29/2008  . ANEMIA-NOS 10/29/2008  . PERIPHERAL NEUROPATHY 10/29/2008  . GERD 10/29/2008  . RENAL INSUFFICIENCY 10/29/2008  . CEREBROVASCULAR ACCIDENT, HX OF 10/29/2008  . TOBACCO DEPENDENCE 02/14/2007  . CVA 02/14/2007  . RHINITIS, ALLERGIC 02/14/2007  . DYSPEPSIA 02/14/2007  . ACNE 02/14/2007  . WEIGHT LOSS, ABNORMAL 02/14/2007    Patient's Medications  New Prescriptions   No medications on file  Previous Medications   AMLODIPINE (NORVASC) 10 MG TABLET    Take 1 tablet (10 mg total) by mouth daily.   ATORVASTATIN (LIPITOR) 40 MG TABLET    Take 1 tablet (40 mg total) by mouth daily.   EFAVIRENZ-EMTRICITABINE-TENOFOVIR (ATRIPLA) 600-200-300 MG PER TABLET    Take 1 tablet by mouth at bedtime.   ENSURE (ENSURE)    Take 1 Can by mouth 3 (three) times daily between meals.   MEGESTROL (MEGACE) 40 MG TABLET    Take 2 tablets (80 mg total) by mouth 2 (two) times daily.   OMEPRAZOLE (PRILOSEC) 20 MG CAPSULE    Take 1 capsule (20 mg total) by mouth daily.  Modified Medications   No medications on file  Discontinued Medications   No medications on file    Subjective: [name] is in for her routine visit. She denies missing any doses of parenteral poor. She is still concerned about her inability to gain weight and stopped taking Megace 1 month ago because she did not feel it was helping. She states that her appetite is very good and  she eats 3 meals daily. She does not like Ensure but has been drinking 2 occasions each day. She eats a lot of lean, baked chicken and vegetables. She quit smoking cigarettes one year ago. She knows the name of all of her medications and describes taking them all correctly.  Review of Systems: Constitutional: negative Eyes: negative Ears, nose, mouth, throat, and face: negative Respiratory: negative Cardiovascular: negative Gastrointestinal: negative for abdominal pain, change in bowel habits, constipation, diarrhea, nausea and vomiting Genitourinary:negative  Past Medical History  Diagnosis Date  . HIV infection   . Substance abuse     history, clean 7 years    History  Substance Use Topics  . Smoking status: Former Smoker -- 0.40 packs/day    Types: Cigarettes    Start date: 12/18/2012  . Smokeless tobacco: Never Used  . Alcohol Use: Yes     Comment: occasionally    Maternal history of high blood pressure  Allergies  Allergen Reactions  . Codeine   . Penicillins   . Chantix [Varenicline] Rash    Objective: Temp: 98.3 F (36.8 C) (11/04 0914) Temp src: Oral (11/04 0914) BP: 135/83 mmHg (11/04 0914) Pulse Rate: 90 (11/04 0914)  General: Her weight is 101.8 pounds which is unchanged since her last visit. Note that over the last 2 years she has gained about 10 pounds. The highest recorded weight weight is 105 pounds  Oral: Some missing teeth but no oropharyngeal lesions Skin: No rash Lungs: Clear Cor: Regular S1 and S2 no murmurs Abdomen: Soft and nontender with no palpable masses Joints and extremities: Normal Neuro: Alert with normal speech and conversation Mood and affect: Normal  Lab Results HIV 1 RNA Quant (copies/mL)  Date Value  09/29/2013 <20   06/02/2013 89*  02/17/2013 23*     CD4 T Cell Abs (/uL)  Date Value  09/29/2013 860   06/02/2013 870   02/17/2013 760      Lab Results  Component Value Date   WBC 5.5 09/29/2013   HGB 10.7* 09/29/2013    HCT 30.7* 09/29/2013   MCV 95.6 09/29/2013   PLT 346 09/29/2013   BMET    Component Value Date/Time   NA 139 09/29/2013 1402   K 4.8 09/29/2013 1402   CL 106 09/29/2013 1402   CO2 25 09/29/2013 1402   GLUCOSE 80 09/29/2013 1402   BUN 26* 09/29/2013 1402   CREATININE 1.47* 09/29/2013 1402   CREATININE 0.93 12/20/2010 2040   CALCIUM 9.6 09/29/2013 1402   GFRNONAA 20* 09/14/2008 1423   GFRAA  Value: 24        The eGFR has been calculated using the MDRD equation. This calculation has not been validated in all clinical* 09/14/2008 1423    Assessment: Richard the infection is under excellent control. I will continue Atripla but we'll get her back in 3 months after blood work including basic metabolic panel GFR. She has some mild renal insufficiency and may need renal dosing of her medication.  Although she is concerned about inability to gain weight she is actually a higher weight balance she was 1-2 years ago.  Her blood pressure is at goal  Plan: 1. Continue Atripla 2. Influenza vaccination 3. Follow up after blood work in 3 months   Cliffton Asters, MD [facility] for Infectious Disease [facility] [phone] pager   [phone] cell 10/21/2013, 9:30 AM

## 2013-10-22 NOTE — Addendum Note (Signed)
Addended by: Jennet Maduro D on: 10/22/2013 08:56 AM   Modules accepted: Orders

## 2013-11-19 ENCOUNTER — Other Ambulatory Visit: Payer: Self-pay | Admitting: *Deleted

## 2013-11-19 DIAGNOSIS — K219 Gastro-esophageal reflux disease without esophagitis: Secondary | ICD-10-CM

## 2013-11-19 MED ORDER — ATORVASTATIN CALCIUM 40 MG PO TABS: 40.0000 mg | ORAL_TABLET | Freq: Every day | ORAL | Status: DC

## 2013-12-15 ENCOUNTER — Other Ambulatory Visit: Payer: Self-pay | Admitting: *Deleted

## 2013-12-15 DIAGNOSIS — B20 Human immunodeficiency virus [HIV] disease: Secondary | ICD-10-CM

## 2013-12-15 MED ORDER — EFAVIRENZ-EMTRICITAB-TENOFOVIR 600-200-300 MG PO TABS: ORAL_TABLET | ORAL | Status: DC

## 2013-12-25 ENCOUNTER — Other Ambulatory Visit: Payer: Self-pay | Admitting: *Deleted

## 2013-12-25 DIAGNOSIS — R634 Abnormal weight loss: Secondary | ICD-10-CM

## 2013-12-25 MED ORDER — ENSURE PO LIQD: 1.0000 | Freq: Three times a day (TID) | ORAL | Status: DC

## 2014-01-13 ENCOUNTER — Other Ambulatory Visit: Payer: Self-pay | Admitting: *Deleted

## 2014-01-13 DIAGNOSIS — K219 Gastro-esophageal reflux disease without esophagitis: Secondary | ICD-10-CM

## 2014-01-13 MED ORDER — OMEPRAZOLE 20 MG PO CPDR: 20.0000 mg | DELAYED_RELEASE_CAPSULE | Freq: Every day | ORAL | Status: DC

## 2014-01-21 ENCOUNTER — Other Ambulatory Visit: Payer: Medicaid Other

## 2014-01-21 DIAGNOSIS — E785 Hyperlipidemia, unspecified: Secondary | ICD-10-CM

## 2014-01-21 DIAGNOSIS — Z113 Encounter for screening for infections with a predominantly sexual mode of transmission: Secondary | ICD-10-CM

## 2014-01-21 DIAGNOSIS — B20 Human immunodeficiency virus [HIV] disease: Secondary | ICD-10-CM

## 2014-01-21 LAB — CBC
HCT: 32.4 % — ABNORMAL LOW (ref 36.0–46.0)
Hemoglobin: 11.1 g/dL — ABNORMAL LOW (ref 12.0–15.0)
MCH: 33.2 pg (ref 26.0–34.0)
MCHC: 34.3 g/dL (ref 30.0–36.0)
MCV: 97 fL (ref 78.0–100.0)
Platelets: 301 10*3/uL (ref 150–400)
RBC: 3.34 MIL/uL — ABNORMAL LOW (ref 3.87–5.11)
RDW: 14 % (ref 11.5–15.5)
WBC: 3.7 10*3/uL — ABNORMAL LOW (ref 4.0–10.5)

## 2014-01-21 LAB — LIPID PANEL
Cholesterol: 238 mg/dL — ABNORMAL HIGH (ref 0–200)
HDL: 84 mg/dL (ref >39)
LDL Cholesterol: 139 mg/dL — ABNORMAL HIGH (ref 0–99)
Total CHOL/HDL Ratio: 2.8 Ratio
Triglycerides: 74 mg/dL (ref <150)
VLDL: 15 mg/dL (ref 0–40)

## 2014-01-21 LAB — BASIC METABOLIC PANEL WITH GFR
BUN: 16 mg/dL (ref 6–23)
CO2: 24 mEq/L (ref 19–32)
Calcium: 9.8 mg/dL (ref 8.4–10.5)
Chloride: 104 mEq/L (ref 96–112)
Creat: 1.08 mg/dL (ref 0.50–1.10)
GFR, Est African American: 68 mL/min
GFR, Est Non African American: 59 mL/min — ABNORMAL LOW
Glucose, Bld: 81 mg/dL (ref 70–99)
Potassium: 4.7 mEq/L (ref 3.5–5.3)
Sodium: 140 mEq/L (ref 135–145)

## 2014-01-21 LAB — RPR

## 2014-01-22 LAB — HIV-1 RNA QUANT-NO REFLEX-BLD
HIV 1 RNA Quant: <20 copies/mL (ref <20)
HIV-1 RNA Quant, Log: <1.3 {Log} (ref <1.30)

## 2014-01-22 LAB — T-HELPER CELL (CD4) - (RCID CLINIC ONLY)
CD4 % Helper T Cell: 36 % (ref 33–55)
CD4 T Cell Abs: 540 /uL (ref 400–2700)

## 2014-02-03 ENCOUNTER — Ambulatory Visit: Payer: Medicaid Other | Admitting: Internal Medicine

## 2014-02-05 ENCOUNTER — Ambulatory Visit: Payer: Medicaid Other | Admitting: Internal Medicine

## 2014-02-17 ENCOUNTER — Ambulatory Visit (INDEPENDENT_AMBULATORY_CARE_PROVIDER_SITE_OTHER): Payer: Medicaid Other | Admitting: Internal Medicine

## 2014-02-17 ENCOUNTER — Encounter: Payer: Self-pay | Admitting: Internal Medicine

## 2014-02-17 VITALS — BP 138/91 | HR 75 | Temp 97.6°F | Wt 98.0 lb

## 2014-02-17 DIAGNOSIS — B20 Human immunodeficiency virus [HIV] disease: Secondary | ICD-10-CM

## 2014-02-17 DIAGNOSIS — E43 Unspecified severe protein-calorie malnutrition: Secondary | ICD-10-CM

## 2014-02-17 MED ORDER — MEGESTROL ACETATE 40 MG PO TABS: 80.0000 mg | ORAL_TABLET | Freq: Four times a day (QID) | ORAL | Status: DC

## 2014-02-17 MED ORDER — DRONABINOL 10 MG PO CAPS: 10.0000 mg | ORAL_CAPSULE | Freq: Two times a day (BID) | ORAL | Status: DC

## 2014-02-17 NOTE — Progress Notes (Signed)
Subjective:    Patient ID: Janice Williams, female    DOB: January 09, 1961, 53 y.o.   MRN: [id number]  HPI 53yo F with HIV, CD 4 count 540/VL<20 (Feb 2015), on atripla. No missing of atripla. Still very diligent on stopping smoking. Caught a cold this winter but no flu or gi illnesses. Wants to gain weight. She had been taking megace but felt that she didn't have any response to it.  Taking classes to get GED at her church on Tuesday and Thursday.  Current Outpatient Prescriptions on File Prior to Visit  Medication Sig Dispense Refill  . amLODipine (NORVASC) 10 MG tablet Take 1 tablet (10 mg total) by mouth daily.  30 tablet  6  . atorvastatin (LIPITOR) 40 MG tablet Take 1 tablet (40 mg total) by mouth daily.  30 tablet  11  . efavirenz-emtricitabine-tenofovir (ATRIPLA) 600-200-300 MG per tablet Take 1 tablet by mouth at bedtime.  30 tablet  3  . ENSURE (ENSURE) Take 1 Can by mouth 3 (three) times daily between meals.  237 mL  11  . megestrol (MEGACE) 40 MG tablet Take 2 tablets (80 mg total) by mouth 2 (two) times daily.  120 tablet  3  . omeprazole (PRILOSEC) 20 MG capsule Take 1 capsule (20 mg total) by mouth daily.  30 capsule  2   No current facility-administered medications on file prior to visit.   Active Ambulatory Problems    Diagnosis Date Noted  . Human Immunodeficiency Virus (HIV) Disease 10/29/2008  . HYPERLIPIDEMIA 06/03/2009  . ANEMIA-NOS 10/29/2008  . TOBACCO DEPENDENCE 02/14/2007  . PERIPHERAL NEUROPATHY 10/29/2008  . EXTERNAL OTITIS 01/06/2010  . CVA 02/14/2007  . ACUTE SINUSITIS, UNSPECIFIED 01/20/2010  . RHINITIS, ALLERGIC 02/14/2007  . GERD 10/29/2008  . DYSPEPSIA 02/14/2007  . RENAL INSUFFICIENCY 10/29/2008  . CARBUNCLE AND FURUNCLE OF FACE 02/04/2009  . ACNE 02/14/2007  . FACIAL RASH 02/04/2009  . WEIGHT LOSS, ABNORMAL 02/14/2007  . WEIGHT LOSS 06/03/2009  . CEREBROVASCULAR ACCIDENT, HX OF 10/29/2008  . EPIDERMOID CYST 02/10/2011  . Dyslipidemia 07/06/2011    . Routine health maintenance 07/06/2011  . Acne cystica 12/22/2011   Resolved Ambulatory Problems    Diagnosis Date Noted  . No Resolved Ambulatory Problems   Past Medical History  Diagnosis Date  . HIV infection   . Substance abuse        Review of Systems 10 point ROS is negative    Objective:   Physical Exam BP 138/91  Pulse 75  Temp(Src) 97.6 F (36.4 C) (Oral)  Wt 98 lb (44.453 kg) Physical Exam  Constitutional:  oriented to person, place, and time. Still very thin. Cachetic. No distress.  HENT:  Mouth/Throat: Oropharynx is clear and moist. No oropharyngeal exudate.  Cardiovascular: Normal rate, regular rhythm and normal heart sounds. Exam reveals no gallop and no friction rub.  No murmur heard.  Pulmonary/Chest: Effort normal and breath sounds normal. No respiratory distress.  has no wheezes.  Abdominal: Soft. Bowel sounds are normal.  exhibits no distension. There is no tenderness.  Lymphadenopathy: no cervical adenopathy.  Neurological: alert and oriented to person, place, and time.  Skin: Skin is warm and dry. No rash noted. No erythema.  Psychiatric: a normal mood and affect. Behavior normal      Assessment & Plan:  HIV = well controlled, continue atripla  Severe Protein-caloric malnutrition  = will switch to marinol since megace not working. will try to get approval for marinol from medicaid. Unable to  take ensure due to lactose intolerant.   Smoking cessation = smoke free x 14 months  rtc in 3 months

## 2014-02-19 ENCOUNTER — Telehealth: Payer: Self-pay | Admitting: *Deleted

## 2014-02-19 NOTE — Telephone Encounter (Signed)
Prior Authorization initiated for dronabinol 10 mg, reference #  i [id number]. Phone # [phone].  Myrtis Hopping

## 2014-02-20 ENCOUNTER — Telehealth: Payer: Self-pay | Admitting: Licensed Clinical Social Worker

## 2014-02-20 NOTE — Telephone Encounter (Signed)
Patient approved for Marinol Approval #[id number] Left patient a message

## 2014-02-24 ENCOUNTER — Other Ambulatory Visit: Payer: Self-pay

## 2014-02-24 DIAGNOSIS — Z1231 Encounter for screening mammogram for malignant neoplasm of breast: Secondary | ICD-10-CM

## 2014-04-03 ENCOUNTER — Ambulatory Visit: Payer: Medicaid Other | Admitting: *Deleted

## 2014-04-03 ENCOUNTER — Other Ambulatory Visit (HOSPITAL_COMMUNITY)
Admission: RE | Admit: 2014-04-03 | Discharge: 2014-04-03 | Disposition: A | Discharge status: Home / Self Care | Payer: Medicaid Other | Source: Ambulatory Visit | Attending: Internal Medicine | Admitting: Internal Medicine

## 2014-04-03 DIAGNOSIS — Z124 Encounter for screening for malignant neoplasm of cervix: Secondary | ICD-10-CM

## 2014-04-03 DIAGNOSIS — Z01419 Encounter for gynecological examination (general) (routine) without abnormal findings: Secondary | ICD-10-CM | POA: Insufficient documentation

## 2014-04-03 NOTE — Progress Notes (Signed)
  Subjective:     Janice Williams is a 53 y.o. woman who comes in today for a  pap smear only.  Previous abnormal Pap smears: no. Contraception: condoms  Objective:    There were no vitals taken for this visit. Pelvic Exam:  Pap smear obtained.   Assessment:    Screening pap smear.   Plan:    Follow up in one year, or as indicated by Pap results.  Pt given educational materials re: HIV and women, self-esteem, BSE, nutrition and diet management, PAP smears and partner safety. Pt given condoms.

## 2014-04-03 NOTE — Patient Instructions (Signed)
Your results will be ready in about a week.  I will mail them to you.  Thank you for coming to the Center for your care.  Faigy Stretch,  RN 

## 2014-04-06 ENCOUNTER — Ambulatory Visit: Payer: Medicaid Other

## 2014-04-07 ENCOUNTER — Ambulatory Visit (INDEPENDENT_AMBULATORY_CARE_PROVIDER_SITE_OTHER): Payer: Medicaid Other | Admitting: Internal Medicine

## 2014-04-07 ENCOUNTER — Encounter: Payer: Self-pay | Admitting: Internal Medicine

## 2014-04-07 VITALS — BP 132/87 | HR 66 | Temp 98.8°F | Wt 99.8 lb

## 2014-04-07 DIAGNOSIS — Z0000 Encounter for general adult medical examination without abnormal findings: Secondary | ICD-10-CM

## 2014-04-07 DIAGNOSIS — E785 Hyperlipidemia, unspecified: Secondary | ICD-10-CM

## 2014-04-07 DIAGNOSIS — K219 Gastro-esophageal reflux disease without esophagitis: Secondary | ICD-10-CM

## 2014-04-07 DIAGNOSIS — L708 Other acne: Secondary | ICD-10-CM

## 2014-04-07 DIAGNOSIS — L700 Acne vulgaris: Secondary | ICD-10-CM

## 2014-04-07 DIAGNOSIS — B20 Human immunodeficiency virus [HIV] disease: Secondary | ICD-10-CM

## 2014-04-07 MED ORDER — ATORVASTATIN CALCIUM 20 MG PO TABS: 20.0000 mg | ORAL_TABLET | Freq: Every day | ORAL | Status: DC

## 2014-04-07 MED ORDER — ELVITEG-COBIC-EMTRICIT-TENOFDF 150-150-200-300 MG PO TABS: 1.0000 | ORAL_TABLET | Freq: Every day | ORAL | Status: DC

## 2014-04-07 NOTE — Progress Notes (Signed)
Patient ID: [name], female   DOB: 27-Jul-1961, 53 y.o.   MRN: [id number] HPI: CHANTEE CERINO is a 53 y.o. female who is here for her f/u visit.  Allergies: Allergies  Allergen Reactions  . Codeine   . Penicillins   . Chantix [Varenicline] Rash    Vitals:    Past Medical History: Past Medical History  Diagnosis Date  . HIV infection   . Substance abuse     history, clean 7 years    Social History: History   Social History  . Marital Status: Single    Spouse Name: N/A    Number of Children: N/A  . Years of Education: N/A   Social History Main Topics  . Smoking status: Former Smoker -- 0.40 packs/day    Types: Cigarettes    Start date: 12/18/2012  . Smokeless tobacco: Never Used  . Alcohol Use: Yes     Comment: occasionally  . Drug Use: No  . Sexual Activity: Not Currently    Partners: Male     Comment: pt. given condoms   Other Topics Concern  . None   Social History Narrative  . None    Previous Regimen:   Current Regimen: Atripla  Labs: HIV 1 RNA Quant (copies/mL)  Date Value  01/21/2014 <20   09/29/2013 <20   06/02/2013 89*     CD4 T Cell Abs (/uL)  Date Value  01/21/2014 540   09/29/2013 860   06/02/2013 870      Hep B S Ab (no units)  Date Value  04/16/2008 NEG      Hepatitis B Surface Ag (no units)  Date Value  04/16/2008 NEG      HCV Ab (no units)  Date Value  04/16/2008 NEG     CrCl: The CrCl is unknown because both a height and weight (above a minimum accepted value) are required for this calculation.  Lipids:    Component Value Date/Time   CHOL 238* 01/21/2014 1014   TRIG 74 01/21/2014 1014   HDL 84 01/21/2014 1014   CHOLHDL 2.8 01/21/2014 1014   VLDL 15 01/21/2014 1014   LDLCALC 139* 01/21/2014 1014    Assessment: She is here to get a optho referral for her vision check up. She is on ATP and is well controlled. Her lipids is slightly elevated. Discussed with her about switching to stribild to have less effect on this. She  was totally ok with it. Also recommended to decrease lipitor to 20mg . Since she has 40mg  tab, she is ok with cutting it in half until the new supply is needed.   Recommendations: Dc ATP Start Stribild 1 tab PO qday Reduce Lipitor to 20mg  PO qday  Wilfred Lacy, PharmD Clinical Infectious Disease [place] for Infect[facility] 04/07/2014, 10:43 PM

## 2014-04-07 NOTE — Progress Notes (Addendum)
Subjective:    Patient ID: Janice Williams, female    DOB: 01/21/1961, 53 y.o.   MRN: [id number]  HPI 53yo F with HIV Disease, CD 4 coutn of 540/VL<20, on atripla. Doing well with adherence. She would like to have ophtho eval since she has prior hx of wearing glasses and her vision is worsening, noticeably becoming far-sighted. Also wondering if she can have derm referral for skin condition. She is often picking at the nodules on her cheeks, forehead.  Current Outpatient Prescriptions on File Prior to Visit  Medication Sig Dispense Refill  . amLODipine (NORVASC) 10 MG tablet Take 1 tablet (10 mg total) by mouth daily.  30 tablet  6  . atorvastatin (LIPITOR) 40 MG tablet Take 1 tablet (40 mg total) by mouth daily.  30 tablet  11  . dronabinol (MARINOL) 10 MG capsule Take 1 capsule (10 mg total) by mouth 2 (two) times daily before a meal.  90 capsule  5  . efavirenz-emtricitabine-tenofovir (ATRIPLA) 600-200-300 MG per tablet Take 1 tablet by mouth at bedtime.  30 tablet  3  . omeprazole (PRILOSEC) 20 MG capsule Take 1 capsule (20 mg total) by mouth daily.  30 capsule  2   No current facility-administered medications on file prior to visit.   Active Ambulatory Problems    Diagnosis Date Noted  . Human Immunodeficiency Virus (HIV) Disease 10/29/2008  . HYPERLIPIDEMIA 06/03/2009  . ANEMIA-NOS 10/29/2008  . TOBACCO DEPENDENCE 02/14/2007  . PERIPHERAL NEUROPATHY 10/29/2008  . EXTERNAL OTITIS 01/06/2010  . CVA 02/14/2007  . ACUTE SINUSITIS, UNSPECIFIED 01/20/2010  . RHINITIS, ALLERGIC 02/14/2007  . GERD 10/29/2008  . DYSPEPSIA 02/14/2007  . RENAL INSUFFICIENCY 10/29/2008  . CARBUNCLE AND FURUNCLE OF FACE 02/04/2009  . ACNE 02/14/2007  . FACIAL RASH 02/04/2009  . WEIGHT LOSS, ABNORMAL 02/14/2007  . WEIGHT LOSS 06/03/2009  . CEREBROVASCULAR ACCIDENT, HX OF 10/29/2008  . EPIDERMOID CYST 02/10/2011  . Dyslipidemia 07/06/2011  . Routine health maintenance 07/06/2011  . Acne cystica  12/22/2011   Resolved Ambulatory Problems    Diagnosis Date Noted  . No Resolved Ambulatory Problems   Past Medical History  Diagnosis Date  . HIV infection   . Substance abuse        Review of Systems 10 point ros is negative     Objective:   Physical Exam BP 132/87  Pulse 66  Temp(Src) 98.8 F (37.1 C) (Oral)  Wt 99 lb 12 oz (45.246 kg) Physical Exam  Constitutional:  oriented to person, place, and time. appears well-developed and well-nourished. No distress.  HENT:  Mouth/Throat: Oropharynx is clear and moist. No oropharyngeal exudate.  Cardiovascular: Normal rate, regular rhythm and normal heart sounds. Exam reveals no gallop and no friction rub.  No murmur heard.  Pulmonary/Chest: Effort normal and breath sounds normal. No respiratory distress.  has no wheezes.  Abdominal: Soft. Bowel sounds are normal.  exhibits no distension. There is no tenderness.  Lymphadenopathy: no cervical adenopathy.  Neurological: alert and oriented to person, place, and time.  Skin: Skin is warm and dry. No rash noted. No erythema. Numerous soft nodules on face? Not necessarily keloid, or pustular acne Psychiatric: a normal mood and affect. behavior is normal.         Assessment & Plan:  hiv = well controlled, will switch to stribild due to increased lipids  Health maintenance = need to arrange for mammo and pap & ophtho referral  Hyperlipidemia = will decrease dose lipitor 20mg  daily  Skin condition = possibly acne. Will give referral to derm

## 2014-04-07 NOTE — Addendum Note (Signed)
Addended by: Carlyle Basques on: 04/07/2014 12:35 PM   Modules accepted: Orders

## 2014-04-08 NOTE — Addendum Note (Signed)
Addended by: Jarrett Ables D on: 04/08/2014 03:30 PM   Modules accepted: Orders

## 2014-04-10 ENCOUNTER — Encounter: Payer: Self-pay | Admitting: *Deleted

## 2014-04-14 ENCOUNTER — Other Ambulatory Visit: Payer: Self-pay | Admitting: Licensed Clinical Social Worker

## 2014-04-14 ENCOUNTER — Encounter: Payer: Self-pay | Admitting: *Deleted

## 2014-04-14 DIAGNOSIS — K219 Gastro-esophageal reflux disease without esophagitis: Secondary | ICD-10-CM

## 2014-04-14 MED ORDER — OMEPRAZOLE 20 MG PO CPDR: 20.0000 mg | DELAYED_RELEASE_CAPSULE | Freq: Every day | ORAL | Status: DC

## 2014-04-23 ENCOUNTER — Ambulatory Visit: Payer: Medicaid Other

## 2014-04-27 ENCOUNTER — Other Ambulatory Visit: Payer: Self-pay | Admitting: Internal Medicine

## 2014-04-27 DIAGNOSIS — I10 Essential (primary) hypertension: Secondary | ICD-10-CM

## 2014-05-08 ENCOUNTER — Ambulatory Visit
Admission: RE | Admit: 2014-05-08 | Discharge: 2014-05-08 | Disposition: A | Discharge status: Home / Self Care | Payer: Medicaid Other | Source: Ambulatory Visit

## 2014-05-08 DIAGNOSIS — Z1231 Encounter for screening mammogram for malignant neoplasm of breast: Secondary | ICD-10-CM

## 2014-05-13 ENCOUNTER — Other Ambulatory Visit: Payer: Self-pay | Admitting: Internal Medicine

## 2014-05-13 DIAGNOSIS — R928 Other abnormal and inconclusive findings on diagnostic imaging of breast: Secondary | ICD-10-CM

## 2014-05-21 ENCOUNTER — Ambulatory Visit: Payer: Medicaid Other | Admitting: Internal Medicine

## 2014-05-22 ENCOUNTER — Encounter (INDEPENDENT_AMBULATORY_CARE_PROVIDER_SITE_OTHER): Payer: Self-pay

## 2014-05-22 ENCOUNTER — Ambulatory Visit
Admission: RE | Admit: 2014-05-22 | Discharge: 2014-05-22 | Disposition: A | Discharge status: Home / Self Care | Payer: Medicaid Other | Source: Ambulatory Visit | Attending: Internal Medicine | Admitting: Internal Medicine

## 2014-05-22 DIAGNOSIS — R928 Other abnormal and inconclusive findings on diagnostic imaging of breast: Secondary | ICD-10-CM

## 2014-06-22 ENCOUNTER — Other Ambulatory Visit: Payer: Self-pay | Admitting: *Deleted

## 2014-06-22 DIAGNOSIS — R634 Abnormal weight loss: Secondary | ICD-10-CM

## 2014-06-22 MED ORDER — ENSURE PO LIQD: 237.0000 mL | Freq: Three times a day (TID) | ORAL | Status: DC

## 2014-07-07 ENCOUNTER — Ambulatory Visit: Payer: Medicaid Other | Admitting: Internal Medicine

## 2014-07-14 ENCOUNTER — Other Ambulatory Visit: Payer: Self-pay | Admitting: Licensed Clinical Social Worker

## 2014-07-14 DIAGNOSIS — K2100 Gastro-esophageal reflux disease with esophagitis, without bleeding: Secondary | ICD-10-CM

## 2014-07-14 MED ORDER — OMEPRAZOLE 20 MG PO CPDR: 20.0000 mg | DELAYED_RELEASE_CAPSULE | Freq: Every day | ORAL | Status: DC

## 2014-08-11 ENCOUNTER — Ambulatory Visit (INDEPENDENT_AMBULATORY_CARE_PROVIDER_SITE_OTHER): Payer: Medicaid Other | Admitting: Internal Medicine

## 2014-08-11 ENCOUNTER — Encounter: Payer: Self-pay | Admitting: Internal Medicine

## 2014-08-11 VITALS — BP 131/92 | HR 85 | Temp 98.1°F | Wt 92.0 lb

## 2014-08-11 DIAGNOSIS — Z23 Encounter for immunization: Secondary | ICD-10-CM

## 2014-08-11 DIAGNOSIS — Z21 Asymptomatic human immunodeficiency virus [HIV] infection status: Secondary | ICD-10-CM

## 2014-08-11 LAB — CBC WITH DIFFERENTIAL/PLATELET
Basophils Absolute: 0.1 10*3/uL (ref 0.0–0.1)
Basophils Relative: 1 % (ref 0–1)
Eosinophils Absolute: 0.1 10*3/uL (ref 0.0–0.7)
Eosinophils Relative: 1 % (ref 0–5)
HCT: 35.3 % — ABNORMAL LOW (ref 36.0–46.0)
Hemoglobin: 11.9 g/dL — ABNORMAL LOW (ref 12.0–15.0)
Lymphocytes Relative: 37 % (ref 12–46)
Lymphs Abs: 1.9 10*3/uL (ref 0.7–4.0)
MCH: 32.9 pg (ref 26.0–34.0)
MCHC: 33.7 g/dL (ref 30.0–36.0)
MCV: 97.5 fL (ref 78.0–100.0)
Monocytes Absolute: 0.4 10*3/uL (ref 0.1–1.0)
Monocytes Relative: 7 % (ref 3–12)
Neutro Abs: 2.8 10*3/uL (ref 1.7–7.7)
Neutrophils Relative %: 54 % (ref 43–77)
Platelets: 318 10*3/uL (ref 150–400)
RBC: 3.62 MIL/uL — ABNORMAL LOW (ref 3.87–5.11)
RDW: 14.4 % (ref 11.5–15.5)
WBC: 5.1 10*3/uL (ref 4.0–10.5)

## 2014-08-11 NOTE — Progress Notes (Signed)
Patient ID: Janice Williams, female   DOB: 02-15-1961, 53 y.o.   MRN: [id number]       Patient ID: Janice Williams, female   DOB: [birth year], 53 y.o.   MRN: [id number]  HPI  53yo M with hiv, underweight. States that she is doing well with medicaitons. Has been eating well but unable to gain weight. Has remained not smoking. Feeling good.  Outpatient Encounter Prescriptions as of 08/11/2014  Medication Sig  . amLODipine (NORVASC) 10 MG tablet TAKE 1 TABLET BY MOUTH DAILY  . atorvastatin (LIPITOR) 20 MG tablet Take 1 tablet (20 mg total) by mouth daily.  [name] dronabinol (MARINOL) 10 MG capsule Take 1 capsule (10 mg total) by mouth 2 (two) times daily before a meal.  . elvitegravir-cobicistat-emtricitabine-tenofovir (STRIBILD) 150-150-200-300 MG TABS tablet Take 1 tablet by mouth daily with breakfast.  . ENSURE (ENSURE) Take 237 mLs by mouth 3 (three) times daily between meals.  [name] omeprazole (PRILOSEC) 20 MG capsule Take 1 capsule (20 mg total) by mouth daily.     Patient Active Problem List   Diagnosis Date Noted  . Acne cystica 12/22/2011  . Dyslipidemia 07/06/2011  . Routine health maintenance 07/06/2011  . EPIDERMOID CYST 02/10/2011  . ACUTE SINUSITIS, UNSPECIFIED 01/20/2010  . EXTERNAL OTITIS 01/06/2010  . HYPERLIPIDEMIA 06/03/2009  . WEIGHT LOSS 06/03/2009  . CARBUNCLE AND FURUNCLE OF FACE 02/04/2009  . FACIAL RASH 02/04/2009  . Human Immunodeficiency Virus (HIV) Disease 10/29/2008  . ANEMIA-NOS 10/29/2008  . PERIPHERAL NEUROPATHY 10/29/2008  . GERD 10/29/2008  . RENAL INSUFFICIENCY 10/29/2008  . CEREBROVASCULAR ACCIDENT, HX OF 10/29/2008  . TOBACCO DEPENDENCE 02/14/2007  . CVA 02/14/2007  . RHINITIS, ALLERGIC 02/14/2007  . DYSPEPSIA 02/14/2007  . ACNE 02/14/2007  . WEIGHT LOSS, ABNORMAL 02/14/2007     Health Maintenance Due  Topic Date Due  . Colonoscopy  08/28/2010  . Influenza Vaccine  07/18/2014     Review of Systems 10 point ros is negative Physical Exam   BP  131/92  Pulse 85  Temp(Src) 98.1 F (36.7 C) (Oral)  Wt 92 lb (41.731 kg) Physical Exam  Constitutional:  oriented to person, place, and time. appears well-developed and well-nourished. No distress. emaciated HENT:  Mouth/Throat: Oropharynx is clear and moist. No oropharyngeal exudate.  Cardiovascular: Normal rate, regular rhythm and normal heart sounds. Exam reveals no gallop and no friction rub.  No murmur heard.  Pulmonary/Chest: Effort normal and breath sounds normal. No respiratory distress.  has no wheezes. Rhonchi at bases  Abdominal: Soft. Bowel sounds are normal.  exhibits no distension. There is no tenderness.  Lymphadenopathy: no cervical adenopathy.  Neurological: alert and oriented to person, place, and time.  Skin: Skin is warm and dry. No rash noted. No erythema.  Psychiatric: a normal mood and affect.  behavior is normal.   Lab Results  Component Value Date   CD4TCELL 36 01/21/2014   Lab Results  Component Value Date   CD4TABS 540 01/21/2014   CD4TABS 860 09/29/2013   CD4TABS 870 06/02/2013   Lab Results  Component Value Date   HIV1RNAQUANT <20 01/21/2014   Lab Results  Component Value Date   HEPBSAB NEG 04/16/2008   No results found for this basename: RPR    CBC Lab Results  Component Value Date   WBC 3.7* 01/21/2014   RBC 3.34* 01/21/2014   HGB 11.1* 01/21/2014   HCT 32.4* 01/21/2014   PLT 301 01/21/2014   MCV 97.0 01/21/2014   MCH 33.2 01/21/2014  MCHC 34.3 01/21/2014   RDW 14.0 01/21/2014   LYMPHSABS 2.9 09/29/2013   MONOABS 0.3 09/29/2013   EOSABS 0.1 09/29/2013   BASOSABS 0.0 09/29/2013   BMET Lab Results  Component Value Date   NA 140 01/21/2014   K 4.7 01/21/2014   CL 104 01/21/2014   CO2 24 01/21/2014   GLUCOSE 81 01/21/2014   BUN 16 01/21/2014   CREATININE 1.08 01/21/2014   CALCIUM 9.8 01/21/2014   GFRNONAA 59* 01/21/2014   GFRAA 68 01/21/2014     Assessment and Plan   hiv = well controlled. Will check labs today. Will continue on stribild  Health maintenance  = will get flu vac today  Weight loss/underweight = continue with marinol and ensure  rtc in 3 months

## 2014-08-12 LAB — T-HELPER CELL (CD4) - (RCID CLINIC ONLY)
CD4 % Helper T Cell: 30 % — ABNORMAL LOW (ref 33–55)
CD4 T Cell Abs: 540 /uL (ref 400–2700)

## 2014-08-12 LAB — COMPLETE METABOLIC PANEL WITH GFR
ALT: 11 U/L (ref 0–35)
AST: 24 U/L (ref 0–37)
Albumin: 5.2 g/dL (ref 3.5–5.2)
Alkaline Phosphatase: 76 U/L (ref 39–117)
BUN: 17 mg/dL (ref 6–23)
CO2: 26 mEq/L (ref 19–32)
Calcium: 10.3 mg/dL (ref 8.4–10.5)
Chloride: 103 mEq/L (ref 96–112)
Creat: 1.25 mg/dL — ABNORMAL HIGH (ref 0.50–1.10)
GFR, Est African American: 57 mL/min — ABNORMAL LOW
GFR, Est Non African American: 49 mL/min — ABNORMAL LOW
Glucose, Bld: 97 mg/dL (ref 70–99)
Potassium: 4.7 mEq/L (ref 3.5–5.3)
Sodium: 139 mEq/L (ref 135–145)
Total Bilirubin: 0.6 mg/dL (ref 0.2–1.2)
Total Protein: 8.5 g/dL — ABNORMAL HIGH (ref 6.0–8.3)

## 2014-08-12 LAB — HIV-1 RNA QUANT-NO REFLEX-BLD
HIV 1 RNA Quant: <20 copies/mL (ref <20)
HIV-1 RNA Quant, Log: <1.3 {Log} (ref <1.30)

## 2014-08-15 ENCOUNTER — Encounter (HOSPITAL_COMMUNITY): Payer: Self-pay | Admitting: Emergency Medicine

## 2014-08-15 ENCOUNTER — Emergency Department (HOSPITAL_COMMUNITY)
Admission: EM | Admit: 2014-08-15 | Discharge: 2014-08-15 | Disposition: A | Discharge status: Home / Self Care | Payer: Medicaid Other | Attending: Emergency Medicine | Admitting: Emergency Medicine

## 2014-08-15 DIAGNOSIS — L03221 Cellulitis of neck: Secondary | ICD-10-CM | POA: Insufficient documentation

## 2014-08-15 DIAGNOSIS — L989 Disorder of the skin and subcutaneous tissue, unspecified: Secondary | ICD-10-CM | POA: Insufficient documentation

## 2014-08-15 DIAGNOSIS — L0211 Cutaneous abscess of neck: Secondary | ICD-10-CM | POA: Insufficient documentation

## 2014-08-15 DIAGNOSIS — Z21 Asymptomatic human immunodeficiency virus [HIV] infection status: Secondary | ICD-10-CM | POA: Insufficient documentation

## 2014-08-15 DIAGNOSIS — L0291 Cutaneous abscess, unspecified: Secondary | ICD-10-CM

## 2014-08-15 MED ORDER — SULFAMETHOXAZOLE-TMP DS 800-160 MG PO TABS: 2.0000 | ORAL_TABLET | Freq: Two times a day (BID) | ORAL | Status: DC

## 2014-08-15 NOTE — ED Notes (Signed)
Declined W/C at D/C and was escorted to lobby by RN. 

## 2014-08-15 NOTE — ED Notes (Signed)
Pt states shes had a painful red bump to L side of neck for past few days

## 2014-08-15 NOTE — ED Provider Notes (Signed)
  Chief Complaint   Chief Complaint  Patient presents with  . Skin Problem    History of Present Illness   Janice Williams is a 53 year old HIV-positive female who has had a painful, red bump on the left side of the neck for the past 2-3 days. The patient has multiple cysts on her face and neck which have been present for years. These are small, but sometimes have gotten infected. She denies any drainage, fever, or chills.  Review of Systems   Other than as noted above, the patient denies any of the following symptoms: Systemic:  No fever, chills or sweats. Skin:  No rash or itching.  [place]   Past medical history, family history, social history, meds, and allergies were reviewed. She is allergic to codeine, penicillin, and Chantix. She takes amlodipine, Lipitor, Marinol, Stribild, and Prilosec.  Physical Examination     Vital signs:  BP 116/75  Pulse 68  Temp(Src) 98.1 F (36.7 C) (Oral)  Resp 16  Ht 5\' 4"  (1.626 m)  Wt 92 lb (41.731 kg)  BMI 15.78 kg/m2  SpO2 96% Skin:  There is a 1 cm, firm, erythematous, tender nodule in the right anterior cervical area. She has multiple smaller, noninflamed nodules over her face and neck.  There was no crepitus, necrosis, ecchymosis, or herrhagic bullae. Skin exam was otherwise normal.  No rash. Ext:  Distal pulses were full, patient has full ROM of all joints.  Procedure Note:  Verbal informed consent was obtained.  The patient was informed of the risks and benefits of the procedure and understands and accepts.  A time out was called and the identity of the patient and correct procedure was confirmed.   The abscess area described above was prepped with Betadine and alcohol and anesthetized with 1 mL of 2% Xylocaine with epinephrine.  Using a #11 scalpel blade, a singe straight incision was made into the area of fluctulence, yielding a small amount of prurulent drainage.  Routine cultures were obtained.  Blunt dissection was used to break up  loculations and the resulting wound cavity was left open.  A sterile pressure dressing was applied.   Assessment   The encounter diagnosis was Abscess.  Plan     1.  Meds:  The following meds were prescribed:   Discharge Medication List as of 08/15/2014  1:37 PM    START taking these medications   Details  sulfamethoxazole-trimethoprim (BACTRIM DS) 800-160 MG per tablet Take 2 tablets by mouth 2 (two) times daily., Starting 08/15/2014, Until Discontinued, Print        2.  Patient Education/Counseling:  The patient was given appropriate handouts, self care instructions, and instructed in symptomatic relief.    3.  Follow up:  The patient was instructed to return if he should become worse in any way.       Harden Mo, MD 08/15/14 2112

## 2014-08-15 NOTE — ED Notes (Signed)
Dsy to Neck dry and intact ,applied per DR Carmela Rima

## 2014-08-15 NOTE — Discharge Instructions (Signed)

## 2014-08-18 LAB — CULTURE, ROUTINE-ABSCESS: Special Requests: NORMAL

## 2014-10-09 ENCOUNTER — Other Ambulatory Visit: Payer: Self-pay | Admitting: Licensed Clinical Social Worker

## 2014-10-09 DIAGNOSIS — K2100 Gastro-esophageal reflux disease with esophagitis, without bleeding: Secondary | ICD-10-CM

## 2014-10-09 MED ORDER — OMEPRAZOLE 20 MG PO CPDR: 20.0000 mg | DELAYED_RELEASE_CAPSULE | Freq: Every day | ORAL | Status: DC

## 2014-11-17 ENCOUNTER — Ambulatory Visit: Payer: Medicaid Other | Admitting: Internal Medicine

## 2014-11-19 ENCOUNTER — Ambulatory Visit: Payer: Medicaid Other | Admitting: Internal Medicine

## 2014-12-21 ENCOUNTER — Other Ambulatory Visit: Payer: Self-pay | Admitting: Internal Medicine

## 2014-12-21 DIAGNOSIS — N6310 Unspecified lump in the right breast, unspecified quadrant: Secondary | ICD-10-CM

## 2014-12-21 DIAGNOSIS — D241 Benign neoplasm of right breast: Secondary | ICD-10-CM

## 2014-12-28 ENCOUNTER — Ambulatory Visit (INDEPENDENT_AMBULATORY_CARE_PROVIDER_SITE_OTHER): Payer: Medicaid Other | Admitting: Internal Medicine

## 2014-12-28 VITALS — BP 135/84 | HR 90 | Temp 98.3°F | Wt 97.0 lb

## 2014-12-28 DIAGNOSIS — M25562 Pain in left knee: Secondary | ICD-10-CM

## 2014-12-28 DIAGNOSIS — Z79899 Other long term (current) drug therapy: Secondary | ICD-10-CM

## 2014-12-28 DIAGNOSIS — Z21 Asymptomatic human immunodeficiency virus [HIV] infection status: Secondary | ICD-10-CM

## 2014-12-28 DIAGNOSIS — Z113 Encounter for screening for infections with a predominantly sexual mode of transmission: Secondary | ICD-10-CM

## 2014-12-28 LAB — CBC WITH DIFFERENTIAL/PLATELET
Basophils Absolute: 0 10*3/uL (ref 0.0–0.1)
Basophils Relative: 1 % (ref 0–1)
Eosinophils Absolute: 0.1 10*3/uL (ref 0.0–0.7)
Eosinophils Relative: 2 % (ref 0–5)
HCT: 33.2 % — ABNORMAL LOW (ref 36.0–46.0)
Hemoglobin: 10.9 g/dL — ABNORMAL LOW (ref 12.0–15.0)
Lymphocytes Relative: 43 % (ref 12–46)
Lymphs Abs: 1.9 10*3/uL (ref 0.7–4.0)
MCH: 32.8 pg (ref 26.0–34.0)
MCHC: 32.8 g/dL (ref 30.0–36.0)
MCV: 100 fL (ref 78.0–100.0)
MPV: 9.6 fL (ref 8.6–12.4)
Monocytes Absolute: 0.4 10*3/uL (ref 0.1–1.0)
Monocytes Relative: 9 % (ref 3–12)
Neutro Abs: 2 10*3/uL (ref 1.7–7.7)
Neutrophils Relative %: 45 % (ref 43–77)
Platelets: 306 10*3/uL (ref 150–400)
RBC: 3.32 MIL/uL — ABNORMAL LOW (ref 3.87–5.11)
RDW: 14 % (ref 11.5–15.5)
WBC: 4.4 10*3/uL (ref 4.0–10.5)

## 2014-12-28 LAB — LIPID PANEL
Cholesterol: 207 mg/dL — ABNORMAL HIGH (ref 0–200)
HDL: 71 mg/dL (ref >39)
LDL Cholesterol: 121 mg/dL — ABNORMAL HIGH (ref 0–99)
Total CHOL/HDL Ratio: 2.9 Ratio
Triglycerides: 74 mg/dL (ref <150)
VLDL: 15 mg/dL (ref 0–40)

## 2014-12-28 LAB — COMPLETE METABOLIC PANEL WITH GFR
ALT: <8 U/L (ref 0–35)
AST: 17 U/L (ref 0–37)
Albumin: 4.4 g/dL (ref 3.5–5.2)
Alkaline Phosphatase: 91 U/L (ref 39–117)
BUN: 22 mg/dL (ref 6–23)
CO2: 26 mEq/L (ref 19–32)
Calcium: 9.8 mg/dL (ref 8.4–10.5)
Chloride: 99 mEq/L (ref 96–112)
Creat: 1.34 mg/dL — ABNORMAL HIGH (ref 0.50–1.10)
GFR, Est African American: 52 mL/min — ABNORMAL LOW
GFR, Est Non African American: 45 mL/min — ABNORMAL LOW
Glucose, Bld: 150 mg/dL — ABNORMAL HIGH (ref 70–99)
Potassium: 4.2 mEq/L (ref 3.5–5.3)
Sodium: 137 mEq/L (ref 135–145)
Total Bilirubin: 0.5 mg/dL (ref 0.2–1.2)
Total Protein: 7.6 g/dL (ref 6.0–8.3)

## 2014-12-28 LAB — RPR

## 2014-12-28 LAB — HEPATITIS B SURFACE ANTIBODY,QUALITATIVE: Hep B S Ab: NEGATIVE

## 2014-12-28 MED ORDER — OXYCODONE-ACETAMINOPHEN 5-325 MG PO TABS: 1.0000 | ORAL_TABLET | Freq: Three times a day (TID) | ORAL | Status: DC | PRN

## 2014-12-28 NOTE — Progress Notes (Signed)
Patient ID: Janice Williams, female   DOB: [birth year], 54 y.o.   MRN: [id number]       Patient ID: Janice Williams, female   DOB: May 18, 1961, 54 y.o.   MRN: [id number]  HPI 54yo F with HIV disease, HTN, underweight, CD 4 count of 540/VL<20. On stribild.reports left knee pain, especially noticeable with the cold weather. Has tried alleve without improvement. She states that her knee pain prevents her from sleeping recently. Still using marinol to help stimulate appetite. No recent illnesses  Outpatient Encounter Prescriptions as of 12/28/2014  Medication Sig  . amLODipine (NORVASC) 10 MG tablet TAKE 1 TABLET BY MOUTH DAILY  . atorvastatin (LIPITOR) 20 MG tablet Take 1 tablet (20 mg total) by mouth daily.  [name] dronabinol (MARINOL) 10 MG capsule Take 1 capsule (10 mg total) by mouth 2 (two) times daily before a meal.  . elvitegravir-cobicistat-emtricitabine-tenofovir (STRIBILD) 150-150-200-300 MG TABS tablet Take 1 tablet by mouth daily with breakfast.  . ENSURE (ENSURE) Take 237 mLs by mouth 3 (three) times daily between meals.  [name] omeprazole (PRILOSEC) 20 MG capsule Take 1 capsule (20 mg total) by mouth daily.  [name] sulfamethoxazole-trimethoprim (BACTRIM DS) 800-160 MG per tablet Take 2 tablets by mouth 2 (two) times daily. (Patient not taking: Reported on 12/28/2014)     Patient Active Problem List   Diagnosis Date Noted  . Acne cystica 12/22/2011  . Dyslipidemia 07/06/2011  . Routine health maintenance 07/06/2011  . EPIDERMOID CYST 02/10/2011  . ACUTE SINUSITIS, UNSPECIFIED 01/20/2010  . EXTERNAL OTITIS 01/06/2010  . HYPERLIPIDEMIA 06/03/2009  . WEIGHT LOSS 06/03/2009  . CARBUNCLE AND FURUNCLE OF FACE 02/04/2009  . FACIAL RASH 02/04/2009  . Human Immunodeficiency Virus (HIV) Disease 10/29/2008  . ANEMIA-NOS 10/29/2008  . PERIPHERAL NEUROPATHY 10/29/2008  . GERD 10/29/2008  . RENAL INSUFFICIENCY 10/29/2008  . CEREBROVASCULAR ACCIDENT, HX OF 10/29/2008  . TOBACCO DEPENDENCE 02/14/2007  . CVA  02/14/2007  . RHINITIS, ALLERGIC 02/14/2007  . DYSPEPSIA 02/14/2007  . ACNE 02/14/2007  . WEIGHT LOSS, ABNORMAL 02/14/2007     Health Maintenance Due  Topic Date Due  . COLONOSCOPY  08/28/2010     Review of Systems 10 point ros is negative except for left knee pain Physical Exam   BP 135/84 mmHg  Pulse 90  Temp(Src) 98.3 F (36.8 C) (Oral)  Wt 97 lb (43.999 kg) Physical Exam  Constitutional:  oriented to person, place, and time. appears well-developed and well-nourished. No distress. Thin frame HENT: bitemporal wasting Mouth/Throat: Oropharynx is clear and moist. No oropharyngeal exudate.  Cardiovascular: Normal rate, regular rhythm and normal heart sounds. Exam reveals no gallop and no friction rub.  No murmur heard.  Pulmonary/Chest: Effort normal and breath sounds normal. No respiratory distress.  has no wheezes.  Lymphadenopathy: no cervical adenopathy.  Ext: left knee shows no effusion, no tenderness at tendon insertion sites. No pain with range of motion Skin: Skin is warm and dry. No rash noted. No erythema.  Psychiatric: a normal mood and affect.  behavior is normal.   Lab Results  Component Value Date   CD4TCELL 30* 08/11/2014   Lab Results  Component Value Date   CD4TABS 540 08/11/2014   CD4TABS 540 01/21/2014   CD4TABS 860 09/29/2013   Lab Results  Component Value Date   HIV1RNAQUANT <20 08/11/2014   Lab Results  Component Value Date   HEPBSAB NEG 04/16/2008   No results found for: RPR  CBC Lab Results  Component Value Date   WBC 5.1  08/11/2014   RBC 3.62* 08/11/2014   HGB 11.9* 08/11/2014   HCT 35.3* 08/11/2014   PLT 318 08/11/2014   MCV 97.5 08/11/2014   MCH 32.9 08/11/2014   MCHC 33.7 08/11/2014   RDW 14.4 08/11/2014   LYMPHSABS 1.9 08/11/2014   MONOABS 0.4 08/11/2014   EOSABS 0.1 08/11/2014   BASOSABS 0.1 08/11/2014   BMET Lab Results  Component Value Date   NA 139 08/11/2014   K 4.7 08/11/2014   CL 103 08/11/2014   CO2 26  08/11/2014   GLUCOSE 97 08/11/2014   BUN 17 08/11/2014   CREATININE 1.25* 08/11/2014   CALCIUM 10.3 08/11/2014   GFRNONAA 49* 08/11/2014   GFRAA 57* 08/11/2014     Assessment and Plan  hiv = plan on changing to genvoya once it is approved by medicaid. Thus far, medicaid not approving it. Will do labs today. Continue with stribild for now  Knee pain = will get left knee xray. Will do trial of percocet  Smoking cessation = 2 years free of smoking, congratulated patient for remaining smoke free  Severe protein cal malnutrition = continue with marinol plus ensure supplementation. 5 lb more than last visit, but does have heavy clothing on at this visit

## 2014-12-29 ENCOUNTER — Other Ambulatory Visit: Payer: Medicaid Other

## 2014-12-29 LAB — HIV-1 RNA QUANT-NO REFLEX-BLD
HIV 1 RNA Quant: 35 copies/mL — ABNORMAL HIGH (ref <20)
HIV-1 RNA Quant, Log: 1.54 {Log} — ABNORMAL HIGH (ref <1.30)

## 2014-12-29 LAB — T-HELPER CELL (CD4) - (RCID CLINIC ONLY)
CD4 % Helper T Cell: 33 % (ref 33–55)
CD4 T Cell Abs: 710 /uL (ref 400–2700)

## 2014-12-31 ENCOUNTER — Other Ambulatory Visit: Payer: Self-pay | Admitting: Licensed Clinical Social Worker

## 2014-12-31 DIAGNOSIS — I10 Essential (primary) hypertension: Secondary | ICD-10-CM

## 2014-12-31 MED ORDER — AMLODIPINE BESYLATE 10 MG PO TABS: 10.0000 mg | ORAL_TABLET | Freq: Every day | ORAL | Status: DC

## 2015-01-06 ENCOUNTER — Other Ambulatory Visit: Payer: Self-pay | Admitting: *Deleted

## 2015-01-06 DIAGNOSIS — K2100 Gastro-esophageal reflux disease with esophagitis, without bleeding: Secondary | ICD-10-CM

## 2015-01-06 MED ORDER — OMEPRAZOLE 20 MG PO CPDR: 20.0000 mg | DELAYED_RELEASE_CAPSULE | Freq: Every day | ORAL | Status: DC

## 2015-01-12 ENCOUNTER — Inpatient Hospital Stay: Admission: RE | Admit: 2015-01-12 | Payer: Medicaid Other | Source: Ambulatory Visit

## 2015-01-18 ENCOUNTER — Other Ambulatory Visit: Payer: Self-pay | Admitting: Licensed Clinical Social Worker

## 2015-01-18 DIAGNOSIS — K2100 Gastro-esophageal reflux disease with esophagitis, without bleeding: Secondary | ICD-10-CM

## 2015-01-18 MED ORDER — OMEPRAZOLE 20 MG PO CPDR: 20.0000 mg | DELAYED_RELEASE_CAPSULE | Freq: Every day | ORAL | Status: DC

## 2015-01-25 ENCOUNTER — Ambulatory Visit
Admission: RE | Admit: 2015-01-25 | Discharge: 2015-01-25 | Disposition: A | Discharge status: Home / Self Care | Payer: Medicaid Other | Source: Ambulatory Visit | Attending: Internal Medicine | Admitting: Internal Medicine

## 2015-01-25 DIAGNOSIS — D241 Benign neoplasm of right breast: Secondary | ICD-10-CM

## 2015-01-25 DIAGNOSIS — N6310 Unspecified lump in the right breast, unspecified quadrant: Secondary | ICD-10-CM

## 2015-01-28 ENCOUNTER — Telehealth: Payer: Self-pay | Admitting: Licensed Clinical Social Worker

## 2015-01-28 NOTE — Telephone Encounter (Signed)
Patient is down to the her last pain medication and wanted a refill, she did not get the x ray ordered from last visit. Please advise

## 2015-02-03 NOTE — Telephone Encounter (Signed)
You can refill pain meds for her, if we haven't done so already.

## 2015-02-04 ENCOUNTER — Other Ambulatory Visit: Payer: Self-pay | Admitting: Licensed Clinical Social Worker

## 2015-02-04 ENCOUNTER — Ambulatory Visit
Admission: RE | Admit: 2015-02-04 | Discharge: 2015-02-04 | Disposition: A | Discharge status: Home / Self Care | Payer: Medicaid Other | Source: Ambulatory Visit | Attending: Internal Medicine | Admitting: Internal Medicine

## 2015-02-04 DIAGNOSIS — M25562 Pain in left knee: Secondary | ICD-10-CM

## 2015-02-04 IMAGING — CR DG KNEE 1-2V*L*
2 series · 2 of 2 positions shown · non-contrast
Comparison: None.

CLINICAL DATA: Left knee pain for 1 week

EXAM:
LEFT KNEE - 1-2 VIEW

[view not recorded (1 of 2)]
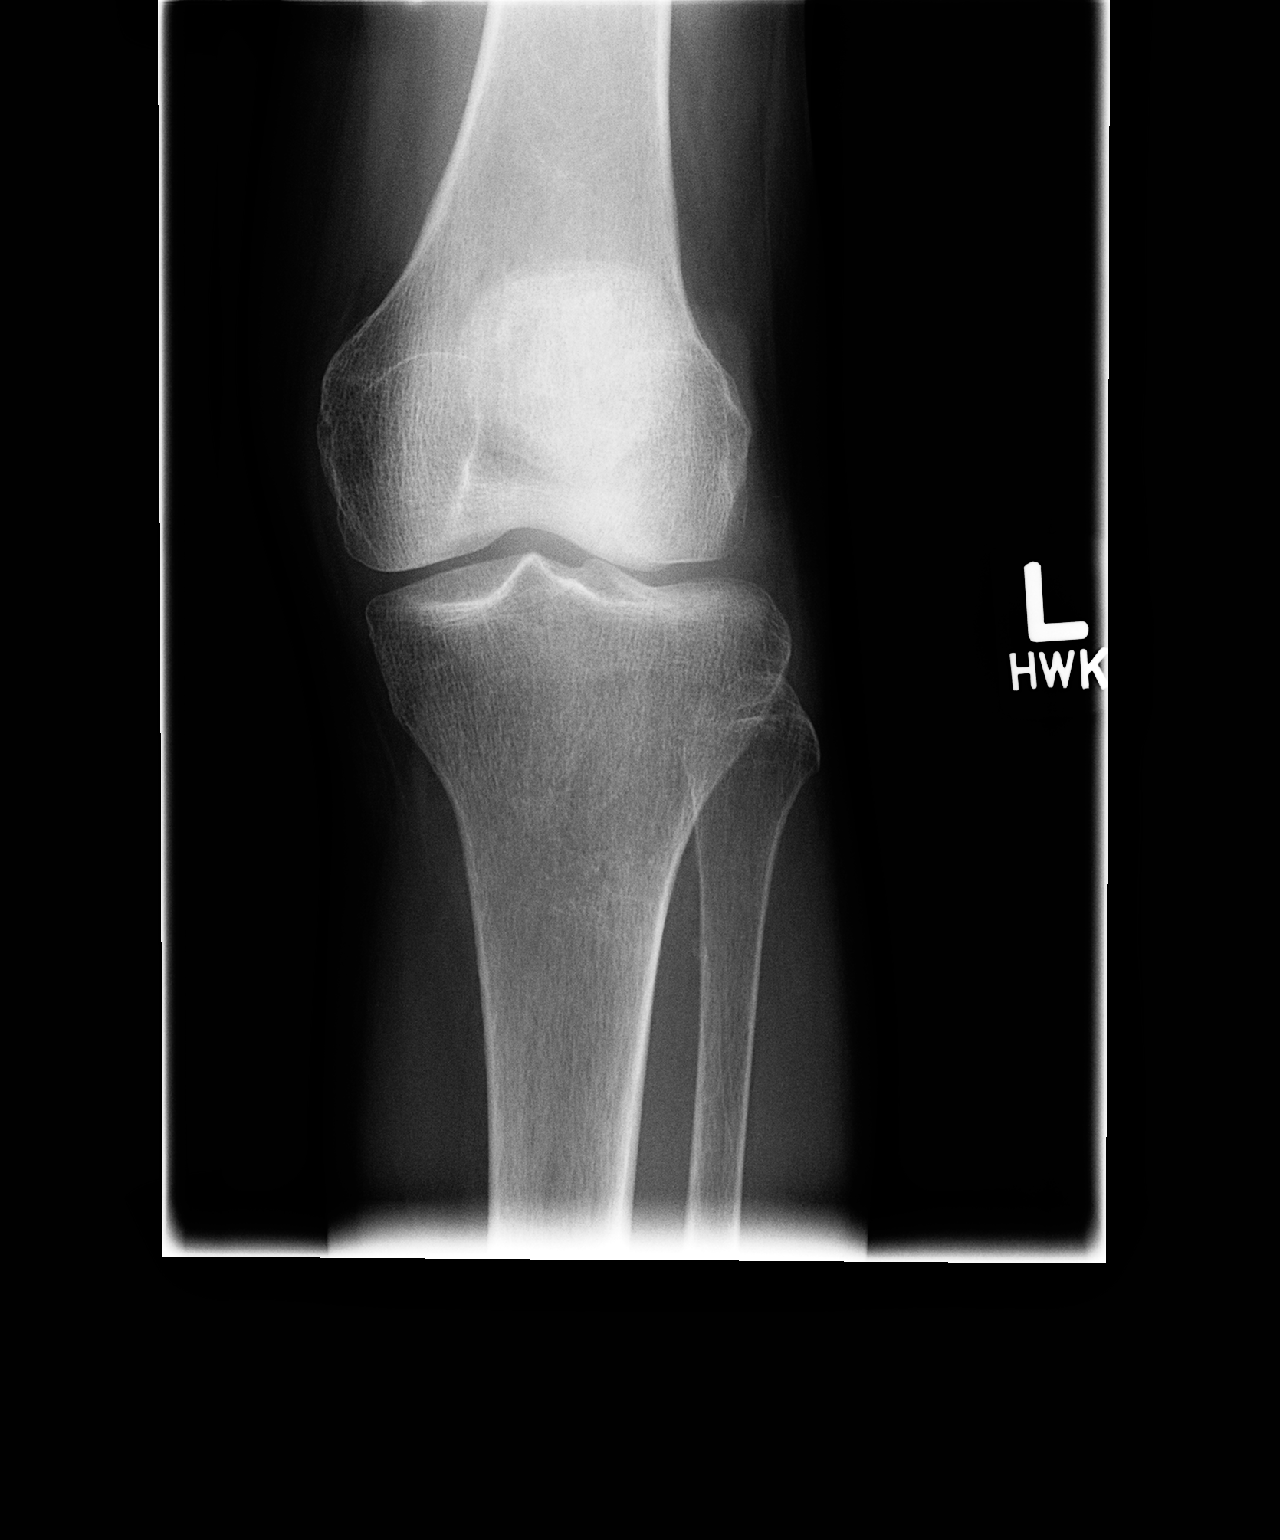

[view not recorded (2 of 2)]
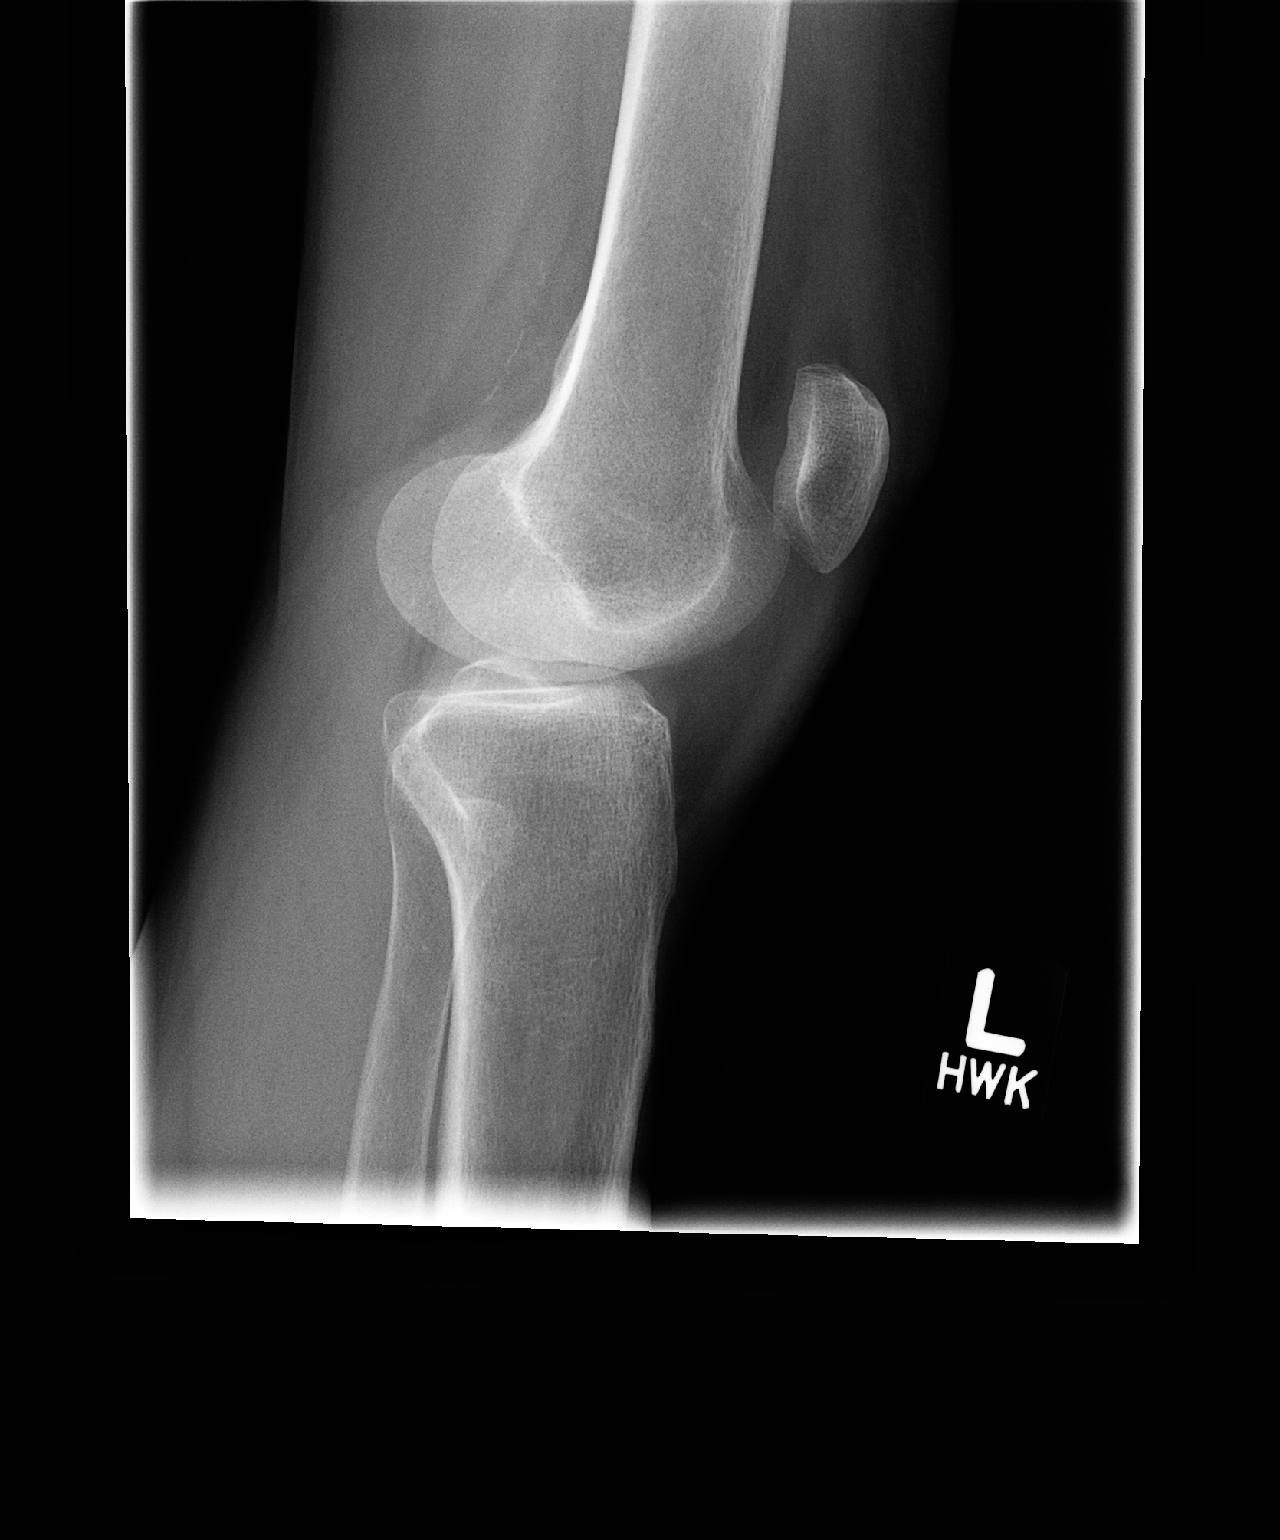

[2 of 2 positions shown; findings below may reference images not displayed]

FINDINGS: The left knee joint spaces are relatively well preserved. No
fracture is seen. No joint effusion is noted.
IMPRESSION: Negative.

## 2015-02-04 MED ORDER — OXYCODONE-ACETAMINOPHEN 5-325 MG PO TABS: 1.0000 | ORAL_TABLET | Freq: Three times a day (TID) | ORAL | Status: DC | PRN

## 2015-03-05 ENCOUNTER — Other Ambulatory Visit: Payer: Self-pay | Admitting: *Deleted

## 2015-03-05 ENCOUNTER — Telehealth: Payer: Self-pay | Admitting: *Deleted

## 2015-03-05 DIAGNOSIS — B20 Human immunodeficiency virus [HIV] disease: Secondary | ICD-10-CM

## 2015-03-05 MED ORDER — ELVITEG-COBIC-EMTRICIT-TENOFDF 150-150-200-300 MG PO TABS: 1.0000 | ORAL_TABLET | Freq: Every day | ORAL | Status: DC

## 2015-03-05 NOTE — Telephone Encounter (Signed)
Patient called and advised she needs refills on her Oxy Rx as she is having pain in her knees and it is getting worse. Advised will ask the doctor and call her back. The patient did go to the Orthopedist and dies not have a follow up there. Advised her to call that office in the mean time and schedule an appt and I will call her once I get an answer.

## 2015-03-10 ENCOUNTER — Telehealth: Payer: Self-pay | Admitting: *Deleted

## 2015-03-10 NOTE — Telephone Encounter (Signed)
Continuing knee pain, not seen by orthopedic MD, xray only.  Requesting appt.  Appt made for 03/15/15 at 1345 w/ Dr. Tommy Medal for this problem.

## 2015-03-14 ENCOUNTER — Encounter (HOSPITAL_COMMUNITY): Payer: Self-pay

## 2015-03-14 ENCOUNTER — Emergency Department (HOSPITAL_COMMUNITY)
Admission: EM | Admit: 2015-03-14 | Discharge: 2015-03-14 | Disposition: A | Discharge status: Home / Self Care | Payer: Medicaid Other | Attending: Emergency Medicine | Admitting: Emergency Medicine

## 2015-03-14 DIAGNOSIS — Z87891 Personal history of nicotine dependence: Secondary | ICD-10-CM | POA: Insufficient documentation

## 2015-03-14 DIAGNOSIS — K0889 Other specified disorders of teeth and supporting structures: Secondary | ICD-10-CM

## 2015-03-14 DIAGNOSIS — Z21 Asymptomatic human immunodeficiency virus [HIV] infection status: Secondary | ICD-10-CM | POA: Diagnosis not present

## 2015-03-14 DIAGNOSIS — K088 Other specified disorders of teeth and supporting structures: Secondary | ICD-10-CM | POA: Diagnosis present

## 2015-03-14 DIAGNOSIS — Z79899 Other long term (current) drug therapy: Secondary | ICD-10-CM | POA: Insufficient documentation

## 2015-03-14 DIAGNOSIS — Z880 Allergy status to penicillin: Secondary | ICD-10-CM | POA: Insufficient documentation

## 2015-03-14 DIAGNOSIS — K029 Dental caries, unspecified: Secondary | ICD-10-CM | POA: Insufficient documentation

## 2015-03-14 MED ORDER — CLINDAMYCIN HCL 150 MG PO CAPS: 150.0000 mg | ORAL_CAPSULE | Freq: Four times a day (QID) | ORAL | Status: DC

## 2015-03-14 NOTE — ED Notes (Signed)
Onset last night left lower molar pain, tooth cracked.  Pt has not tried meds, tried salt/water and water/peroxide gargles.

## 2015-03-14 NOTE — ED Provider Notes (Signed)
CSN: [id number]     Arrival date & time 03/14/15  1157 History  This chart was scribed for non-physician practitioner, Glendell Docker, NP, working with Dorie Rank, MD, by Ian Bushman, ED Scribe. This patient was seen in room TR09C/TR09C and the patient's care was started at 12:08 PM.   First MD Initiated Contact with Patient 03/14/15 1159     No chief complaint on file.  (Consider location/radiation/quality/duration/timing/severity/associated sxs/prior Treatment) HPI HPI Comments: Janice Williams is a 54 y.o. female who presents to the Emergency Department complaining of an abscess with pain in her left lower molar area pain onset last night.  Patient has been gargling salt water and has used peroxide but states that it is still there and unchanged.  Patient has a Pharmacist, community and will be making an appointment tomorrow.  Patient has no other complaints today.    Past Medical History  Diagnosis Date  . HIV infection   . Substance abuse     history, clean 7 years   No past surgical history on file. No family history on file. History  Substance Use Topics  . Smoking status: Former Smoker -- 0.40 packs/day    Types: Cigarettes    Start date: 12/18/2012  . Smokeless tobacco: Never Used  . Alcohol Use: Yes     Comment: occasionally   OB History    No data available     Review of Systems  HENT: Positive for dental problem.   All other systems reviewed and are negative.     Allergies  Codeine; Penicillins; and Chantix  Home Medications   Prior to Admission medications   Medication Sig Start Date End Date Taking? Authorizing Provider  amLODipine (NORVASC) 10 MG tablet Take 1 tablet (10 mg total) by mouth daily. 12/31/14   Carlyle Basques, MD  atorvastatin (LIPITOR) 20 MG tablet Take 1 tablet (20 mg total) by mouth daily. 04/07/14   Carlyle Basques, MD  dronabinol (MARINOL) 10 MG capsule Take 1 capsule (10 mg total) by mouth 2 (two) times daily before a meal. 02/17/14   Carlyle Basques, MD  elvitegravir-cobicistat-emtricitabine-tenofovir (STRIBILD) 150-150-200-300 MG TABS tablet Take 1 tablet by mouth daily with breakfast. 03/05/15   Carlyle Basques, MD  ENSURE (ENSURE) Take 237 mLs by mouth 3 (three) times daily between meals. 06/22/14   Carlyle Basques, MD  omeprazole (PRILOSEC) 20 MG capsule Take 1 capsule (20 mg total) by mouth daily. 01/18/15   Carlyle Basques, MD  oxyCODONE-acetaminophen (PERCOCET/ROXICET) 5-325 MG per tablet Take 1 tablet by mouth every 8 (eight) hours as needed for severe pain. 02/04/15   Thayer Headings, MD   There were no vitals taken for this visit. Physical Exam  Constitutional: She is oriented to person, place, and time. She appears well-developed and well-nourished. No distress.  HENT:  Head: Normocephalic and atraumatic.  Right Ear: External ear normal.  Multiple decayed teeth to the left lower dentition. No swelling noted   Eyes: Conjunctivae and EOM are normal.  Neck: Neck supple.  Cardiovascular: Normal rate.   Pulmonary/Chest: Effort normal. No respiratory distress.  Musculoskeletal: Normal range of motion.  Neurological: She is alert and oriented to person, place, and time.  Skin: Skin is warm and dry.  Psychiatric: She has a normal mood and affect. Her behavior is normal.  Nursing note and vitals reviewed.   ED Course  Procedures (including critical care time) DIAGNOSTIC STUDIES: Oxygen Saturation is 100% on RA, normal by my interpretation.    COORDINATION OF CARE:  12:10 PM Discussed treatment plan with patient at beside, the patient agrees with the plan and has no further questions at this time.   Labs Review Labs Reviewed - No data to display  Imaging Review No results found.   EKG Interpretation None      MDM   Final diagnoses:  Toothache   Will  Treat for possible infection with clindamycin  I personally performed the services described in this documentation, which was scribed in my presence. The recorded  information has been reviewed and is accurate.    Glendell Docker, NP 03/14/15 [place], MD 03/16/15 2312

## 2015-03-14 NOTE — Discharge Instructions (Signed)
Follow up with your dentist as discussed °Dental Pain °A tooth ache may be caused by cavities (tooth decay). Cavities expose the nerve of the tooth to air and hot or cold temperatures. It may come from an infection or abscess (also called a boil or furuncle) around your tooth. It is also often caused by dental caries (tooth decay). This causes the pain you are having. °DIAGNOSIS  °Your caregiver can diagnose this problem by exam. °TREATMENT  °· If caused by an infection, it may be treated with medications which kill germs (antibiotics) and pain medications as prescribed by your caregiver. Take medications as directed. °· Only take over-the-counter or prescription medicines for pain, discomfort, or fever as directed by your caregiver. °· Whether the tooth ache today is caused by infection or dental disease, you should see your dentist as soon as possible for further care. °SEEK MEDICAL CARE IF: °The exam and treatment you received today has been provided on an emergency basis only. This is not a substitute for complete medical or dental care. If your problem worsens or new problems (symptoms) appear, and you are unable to meet with your dentist, call or return to this location. °SEEK IMMEDIATE MEDICAL CARE IF:  °· You have a fever. °· You develop redness and swelling of your face, jaw, or neck. °· You are unable to open your mouth. °· You have severe pain uncontrolled by pain medicine. °MAKE SURE YOU:  °· Understand these instructions. °· Will watch your condition. °· Will get help right away if you are not doing well or get worse. °Document Released: 12/04/2005 Document Revised: 02/26/2012 Document Reviewed: 07/22/2008 °[employer]® Patient Information ©2015 [employer]. This information is not intended to replace advice given to you by your health care provider. Make sure you discuss any questions you have with your health care provider. ° °

## 2015-03-15 ENCOUNTER — Other Ambulatory Visit: Payer: Self-pay | Admitting: *Deleted

## 2015-03-15 ENCOUNTER — Ambulatory Visit (INDEPENDENT_AMBULATORY_CARE_PROVIDER_SITE_OTHER): Payer: Medicaid Other | Admitting: Infectious Disease

## 2015-03-15 ENCOUNTER — Encounter: Payer: Self-pay | Admitting: Infectious Disease

## 2015-03-15 VITALS — BP 117/75 | HR 75 | Temp 98.7°F | Ht 63.0 in | Wt 95.0 lb

## 2015-03-15 DIAGNOSIS — B20 Human immunodeficiency virus [HIV] disease: Secondary | ICD-10-CM | POA: Diagnosis not present

## 2015-03-15 DIAGNOSIS — M25562 Pain in left knee: Secondary | ICD-10-CM | POA: Insufficient documentation

## 2015-03-15 DIAGNOSIS — K047 Periapical abscess without sinus: Secondary | ICD-10-CM

## 2015-03-15 DIAGNOSIS — M25561 Pain in right knee: Secondary | ICD-10-CM | POA: Diagnosis not present

## 2015-03-15 HISTORY — DX: Periapical abscess without sinus: K04.7

## 2015-03-15 MED ORDER — TRAMADOL HCL 50 MG PO TABS: 50.0000 mg | ORAL_TABLET | Freq: Two times a day (BID) | ORAL | Status: DC

## 2015-03-15 MED ORDER — CLINDAMYCIN HCL 300 MG PO CAPS: 300.0000 mg | ORAL_CAPSULE | Freq: Three times a day (TID) | ORAL | Status: DC

## 2015-03-15 NOTE — Progress Notes (Signed)
Subjective:    Patient ID: Janice Williams, female    DOB: November 22, 1961, 54 y.o.   MRN: [id number]  HPI  54 year old African American lady with HIV that has been well controlled who presents to clinic for bilateral knee pain.  She was seen in ED yesterday also for tooth pain and on exam has apparent abscess left lower side near last tooth.  She was started on clindamycin 150mg  tid.  Her knee pain is tingling 8/10 in severity helped by oxycodone which she is out of at present. NSAIDS are not allevatiating. Pain is not associated with fever, No recent trauma. Films reviewed and were normal   Review of Systems  Constitutional: Negative for fever, chills, diaphoresis, activity change, appetite change, fatigue and unexpected weight change.  HENT: Positive for dental problem. Negative for congestion, rhinorrhea, sinus pressure, sneezing, sore throat and trouble swallowing.   Eyes: Negative for photophobia and visual disturbance.  Respiratory: Negative for cough, chest tightness, shortness of breath, wheezing and stridor.   Cardiovascular: Negative for chest pain, palpitations and leg swelling.  Gastrointestinal: Negative for nausea, vomiting, abdominal pain, diarrhea, constipation, blood in stool, abdominal distention and anal bleeding.  Genitourinary: Negative for dysuria, hematuria, flank pain and difficulty urinating.  Musculoskeletal: Positive for myalgias and joint swelling. Negative for back pain, arthralgias and gait problem.  Skin: Negative for color change, pallor, rash and wound.  Neurological: Negative for dizziness, tremors, weakness and light-headedness.  Hematological: Negative for adenopathy. Does not bruise/bleed easily.  Psychiatric/Behavioral: Negative for behavioral problems, confusion, sleep disturbance, dysphoric mood, decreased concentration and agitation.       Objective:   Physical Exam  Constitutional: She is oriented to person, place, and time. She appears  well-developed and well-nourished. No distress.  HENT:  Head: Normocephalic and atraumatic.  Mouth/Throat: No oropharyngeal exudate.    Eyes: Conjunctivae and EOM are normal. No scleral icterus.  Neck: Normal range of motion. Neck supple.  Cardiovascular: Normal rate and regular rhythm.   Pulmonary/Chest: Effort normal. No respiratory distress. She has no wheezes.  Abdominal: She exhibits no distension.  Musculoskeletal: She exhibits no edema.       Right knee: She exhibits abnormal patellar mobility. She exhibits normal range of motion, no swelling, no effusion, no ecchymosis, no deformity, no laceration, no erythema, normal alignment and no LCL laxity. Tenderness found.       Left knee: She exhibits normal range of motion, no swelling, no effusion, no ecchymosis, no deformity, no laceration, no erythema, normal alignment and no LCL laxity. Tenderness found.  Neurological: She is alert and oriented to person, place, and time. She exhibits normal muscle tone. Coordination normal.  Skin: Skin is warm and dry. No rash noted. She is not diaphoretic. No erythema. No pallor.  Psychiatric: She has a normal mood and affect. Her behavior is normal. Judgment and thought content normal.          Assessment & Plan:   Dental abscess: Up dose of clinda to 300mg  tid x10 days  See dentist/oral surgeon ASAP   Knee pain: I have found nothing wrong with her knee and plain films negative. I have suspicion there may be diversion and discussed with Dr. Baxter Flattery. We will go ahead to give ultram 50mg  bid prn to manage her tooth pain and and her knees. Her tooth pain is clearly real  HIV:  Lab Results  Component Value Date   HIV1RNAQUANT 35* 12/28/2014   Lab Results  Component Value Date  CD4TABS 710 12/28/2014   CD4TABS 540 08/11/2014   CD4TABS 540 01/21/2014   Should change to Janice Williams from [place] at next appt.

## 2015-03-29 ENCOUNTER — Other Ambulatory Visit: Payer: Medicaid Other

## 2015-04-01 ENCOUNTER — Other Ambulatory Visit: Payer: Medicaid Other

## 2015-04-01 DIAGNOSIS — B20 Human immunodeficiency virus [HIV] disease: Secondary | ICD-10-CM

## 2015-04-01 LAB — CBC WITH DIFFERENTIAL/PLATELET
Basophils Absolute: 0 10*3/uL (ref 0.0–0.1)
Basophils Relative: 1 % (ref 0–1)
Eosinophils Absolute: 0 10*3/uL (ref 0.0–0.7)
Eosinophils Relative: 1 % (ref 0–5)
HCT: 34.2 % — ABNORMAL LOW (ref 36.0–46.0)
Hemoglobin: 11.3 g/dL — ABNORMAL LOW (ref 12.0–15.0)
Lymphocytes Relative: 50 % — ABNORMAL HIGH (ref 12–46)
Lymphs Abs: 2.1 10*3/uL (ref 0.7–4.0)
MCH: 32.8 pg (ref 26.0–34.0)
MCHC: 33 g/dL (ref 30.0–36.0)
MCV: 99.1 fL (ref 78.0–100.0)
MPV: 9.7 fL (ref 8.6–12.4)
Monocytes Absolute: 0.3 10*3/uL (ref 0.1–1.0)
Monocytes Relative: 6 % (ref 3–12)
Neutro Abs: 1.8 10*3/uL (ref 1.7–7.7)
Neutrophils Relative %: 42 % — ABNORMAL LOW (ref 43–77)
Platelets: 300 10*3/uL (ref 150–400)
RBC: 3.45 MIL/uL — ABNORMAL LOW (ref 3.87–5.11)
RDW: 14.7 % (ref 11.5–15.5)
WBC: 4.2 10*3/uL (ref 4.0–10.5)

## 2015-04-01 LAB — COMPLETE METABOLIC PANEL WITH GFR
ALT: <8 U/L (ref 0–35)
AST: 15 U/L (ref 0–37)
Albumin: 4.2 g/dL (ref 3.5–5.2)
Alkaline Phosphatase: 90 U/L (ref 39–117)
BUN: 18 mg/dL (ref 6–23)
CO2: 27 mEq/L (ref 19–32)
Calcium: 9.6 mg/dL (ref 8.4–10.5)
Chloride: 105 mEq/L (ref 96–112)
Creat: 1.04 mg/dL (ref 0.50–1.10)
GFR, Est African American: 70 mL/min
GFR, Est Non African American: 61 mL/min
Glucose, Bld: 87 mg/dL (ref 70–99)
Potassium: 5 mEq/L (ref 3.5–5.3)
Sodium: 139 mEq/L (ref 135–145)
Total Bilirubin: 0.4 mg/dL (ref 0.2–1.2)
Total Protein: 7.3 g/dL (ref 6.0–8.3)

## 2015-04-02 LAB — HIV-1 RNA QUANT-NO REFLEX-BLD
HIV 1 RNA Quant: <20 copies/mL (ref <20)
HIV-1 RNA Quant, Log: <1.3 {Log} (ref <1.30)

## 2015-04-02 LAB — T-HELPER CELL (CD4) - (RCID CLINIC ONLY)
CD4 % Helper T Cell: 34 % (ref 33–55)
CD4 T Cell Abs: 720 /uL (ref 400–2700)

## 2015-04-13 ENCOUNTER — Ambulatory Visit (INDEPENDENT_AMBULATORY_CARE_PROVIDER_SITE_OTHER): Payer: Medicaid Other | Admitting: Internal Medicine

## 2015-04-13 VITALS — Temp 97.7°F | Wt 94.0 lb

## 2015-04-13 DIAGNOSIS — B20 Human immunodeficiency virus [HIV] disease: Secondary | ICD-10-CM | POA: Diagnosis not present

## 2015-04-13 DIAGNOSIS — K047 Periapical abscess without sinus: Secondary | ICD-10-CM | POA: Diagnosis not present

## 2015-04-13 DIAGNOSIS — T148XXA Other injury of unspecified body region, initial encounter: Secondary | ICD-10-CM

## 2015-04-13 DIAGNOSIS — T148 Other injury of unspecified body region: Secondary | ICD-10-CM | POA: Diagnosis present

## 2015-04-13 DIAGNOSIS — R634 Abnormal weight loss: Secondary | ICD-10-CM

## 2015-04-13 MED ORDER — METHOCARBAMOL 500 MG PO TABS
500.0000 mg | ORAL_TABLET | Freq: Three times a day (TID) | ORAL | Status: DC | PRN
Start: 2015-04-13 — End: 2015-10-26

## 2015-04-13 NOTE — Progress Notes (Signed)
   Subjective:    Patient ID: Janice Williams, female    DOB: Apr 27, 1961, 54 y.o.   MRN: [id number]  HPI Janice Williams is a 54 y/o female with HIV who presents to the clinic for f/u. She has been taking her Stribild every day without any missed doses. She is currently undetectable and CD4 count of 720. She states she has had arthritic pain with morning stiffness that improves with activity. She has been taking percocet for pain which she states has been helping. She denies trying heat or stretching in the morning. Her shoulder has been causing her pain and she states she has had a lot of muscle stiffness and cramping. On Thursday she is going to have all her teeth pulled. She is very happy about this. Her weight is down but she states she has been eating a lot of meats and greens throughout the day. She denies taking Marinol. She is drinking ensure daily.    Lab Results  Component Value Date   CD4TABS 720 04/01/2015   CD4TABS 710 12/28/2014   CD4TABS 540 08/11/2014    Lab Results  Component Value Date   HIV1RNAQUANT <20 04/01/2015     Review of Systems  Constitutional: Positive for unexpected weight change. Negative for fever, appetite change and fatigue.  HENT: Positive for dental problem. Negative for mouth sores and sore throat.   Eyes: Negative for photophobia and visual disturbance.  Respiratory: Negative for shortness of breath.   Cardiovascular: Negative for chest pain.  Gastrointestinal: Negative for abdominal pain and abdominal distention.  Endocrine: Negative.   Genitourinary: Negative for difficulty urinating.  Musculoskeletal: Positive for myalgias, back pain and arthralgias.  Skin: Negative.   Neurological: Negative for dizziness, weakness, numbness and headaches.  Hematological: Negative for adenopathy.  Psychiatric/Behavioral: Negative for behavioral problems and agitation. The patient is not nervous/anxious.        Objective:   Physical Exam  Constitutional: She is  oriented to person, place, and time. She appears well-developed.  HENT:  Head: Normocephalic and atraumatic.  Mouth/Throat: No oropharyngeal exudate.  Eyes: Pupils are equal, round, and reactive to light.  Neck: Normal range of motion. Neck supple.  Cardiovascular: Normal rate and regular rhythm.   Pulmonary/Chest: Effort normal and breath sounds normal.  Abdominal: Soft. Bowel sounds are normal.  Musculoskeletal: Normal range of motion.  Lymphadenopathy:    She has no cervical adenopathy.  Neurological: She is alert and oriented to person, place, and time.  Skin: Skin is warm and dry.  Psychiatric: She has a normal mood and affect.          Assessment & Plan:  HIV: Continue current regimen RTC in 3 months  Joint and muscle pain: Start robaxin for muscle pain Stretch and use heating pads for point pain PRN NSAIDS Stop Percocet for pain  Cachexia: Continue with boost three times daily If trouble eating, please contact office

## 2015-04-13 NOTE — Progress Notes (Signed)
Patient ID: Janice Williams, female   DOB: 09/07/1961, 54 y.o.   MRN: [id number]       Patient ID: Janice Williams, female   DOB: 03-Nov-1961, 54 y.o.   MRN: [id number]  HPI [name] is a 54yo F with HIV disease, peripheral neuropathy, HTN. She states that she still has ongoing pain to her knees despite having recent xrays not showing any evidence of degenerate joint disease. She is also complain on pain to left sided trapezius region.  has upcoming dental appt for teeth extraction she is looking forward. She had finished anbtx for dental abscess. Has good appetite despite 3 lb weight loss  Outpatient Encounter Prescriptions as of 04/13/2015  Medication Sig  . amLODipine (NORVASC) 10 MG tablet Take 1 tablet (10 mg total) by mouth daily.  [name] atorvastatin (LIPITOR) 20 MG tablet Take 1 tablet (20 mg total) by mouth daily.  . clindamycin (CLEOCIN) 300 MG capsule Take 1 capsule (300 mg total) by mouth 3 (three) times daily.  [name] dronabinol (MARINOL) 10 MG capsule Take 1 capsule (10 mg total) by mouth 2 (two) times daily before a meal.  . elvitegravir-cobicistat-emtricitabine-tenofovir (STRIBILD) 150-150-200-300 MG TABS tablet Take 1 tablet by mouth daily with breakfast.  . ENSURE (ENSURE) Take 237 mLs by mouth 3 (three) times daily between meals.  [name] omeprazole (PRILOSEC) 20 MG capsule Take 1 capsule (20 mg total) by mouth daily.  [name] oxyCODONE-acetaminophen (PERCOCET/ROXICET) 5-325 MG per tablet Take 1 tablet by mouth every 8 (eight) hours as needed for severe pain.  . traMADol (ULTRAM) 50 MG tablet Take 1 tablet (50 mg total) by mouth 2 (two) times daily.     Patient Active Problem List   Diagnosis Date Noted  . Dental abscess 03/15/2015  . Knee pain, bilateral 03/15/2015  . Acne cystica 12/22/2011  . Dyslipidemia 07/06/2011  . Routine health maintenance 07/06/2011  . EPIDERMOID CYST 02/10/2011  . ACUTE SINUSITIS, UNSPECIFIED 01/20/2010  . EXTERNAL OTITIS 01/06/2010  . HYPERLIPIDEMIA 06/03/2009  .  WEIGHT LOSS 06/03/2009  . CARBUNCLE AND FURUNCLE OF FACE 02/04/2009  . FACIAL RASH 02/04/2009  . Human immunodeficiency virus (HIV) disease 10/29/2008  . ANEMIA-NOS 10/29/2008  . PERIPHERAL NEUROPATHY 10/29/2008  . GERD 10/29/2008  . RENAL INSUFFICIENCY 10/29/2008  . CEREBROVASCULAR ACCIDENT, HX OF 10/29/2008  . TOBACCO DEPENDENCE 02/14/2007  . CVA 02/14/2007  . RHINITIS, ALLERGIC 02/14/2007  . DYSPEPSIA 02/14/2007  . ACNE 02/14/2007  . WEIGHT LOSS, ABNORMAL 02/14/2007     Health Maintenance Due  Topic Date Due  . COLONOSCOPY  08/28/2010  . PAP SMEAR  04/04/2015     Review of Systems 10 point ros reviewed but positive pertinents listed above Physical Exam   Temp(Src) 97.7 F (36.5 C) (Oral)  Wt 94 lb (42.638 kg) Physical Exam  Constitutional:  oriented to person, place, and time. appears well-developed and well-nourished. No distress.  HENT: [place]/AT, PERRLA, no scleral icterus. Poor dentition Mouth/Throat: Oropharynx is clear and moist. No oropharyngeal exudate.  Cardiovascular: Normal rate, regular rhythm and normal heart sounds. Exam reveals no gallop and no friction rub.  No murmur heard.  Pulmonary/Chest: Effort normal and breath sounds normal. No respiratory distress.  has no wheezes.  Neck = supple, no nuchal rigidity Lymphadenopathy: no cervical adenopathy. No axillary adenopathy Neurological: alert and oriented to person, place, and time. .    Lab Results  Component Value Date   CD4TCELL 34 04/01/2015   Lab Results  Component Value Date   CD4TABS 720 04/01/2015  CD4TABS 710 12/28/2014   CD4TABS 540 08/11/2014   Lab Results  Component Value Date   HIV1RNAQUANT <20 04/01/2015   Lab Results  Component Value Date   HEPBSAB NEG 12/28/2014   No results found for: RPR  CBC Lab Results  Component Value Date   WBC 4.2 04/01/2015   RBC 3.45* 04/01/2015   HGB 11.3* 04/01/2015   HCT 34.2* 04/01/2015   PLT 300 04/01/2015   MCV 99.1 04/01/2015   MCH  32.8 04/01/2015   MCHC 33.0 04/01/2015   RDW 14.7 04/01/2015   LYMPHSABS 2.1 04/01/2015   MONOABS 0.3 04/01/2015   EOSABS 0.0 04/01/2015   BASOSABS 0.0 04/01/2015   BMET Lab Results  Component Value Date   NA 139 04/01/2015   K 5.0 04/01/2015   CL 105 04/01/2015   CO2 27 04/01/2015   GLUCOSE 87 04/01/2015   BUN 18 04/01/2015   CREATININE 1.04 04/01/2015   CALCIUM 9.6 04/01/2015   GFRNONAA 61 04/01/2015   GFRAA 70 04/01/2015     Assessment and Plan   hiv disease = well controlled, continue on current regimen  Knee pain, bilateral = still appears to be consistent with oa since she reports early monring stiffness that improves with movement. Can try prn naproxen on full stomach. Would avoid using opiates  Musculoskeletal strain = will try a trial of robaxin  Dental abscess= appears resolved but agree that extraction would be definitive treatment  Unintentional weight loss = will continue to monitor. Appears to have good appetite,but underweight for her frame

## 2015-04-22 ENCOUNTER — Telehealth: Payer: Self-pay | Admitting: *Deleted

## 2015-04-22 NOTE — Telephone Encounter (Signed)
Patient called to inquire if her paperwork is ready for an in home aide.This is for a CNA to assist with personal care issues. Please advise if you received the paperwork. Janice Williams

## 2015-04-29 ENCOUNTER — Encounter (HOSPITAL_COMMUNITY): Payer: Self-pay | Admitting: Emergency Medicine

## 2015-04-29 ENCOUNTER — Emergency Department (HOSPITAL_COMMUNITY): Payer: Medicaid Other

## 2015-04-29 ENCOUNTER — Emergency Department (HOSPITAL_COMMUNITY)
Admission: EM | Admit: 2015-04-29 | Discharge: 2015-04-29 | Disposition: A | Discharge status: Home / Self Care | Payer: Medicaid Other | Attending: Emergency Medicine | Admitting: Emergency Medicine

## 2015-04-29 ENCOUNTER — Other Ambulatory Visit: Payer: Self-pay | Admitting: *Deleted

## 2015-04-29 DIAGNOSIS — Y9289 Other specified places as the place of occurrence of the external cause: Secondary | ICD-10-CM | POA: Insufficient documentation

## 2015-04-29 DIAGNOSIS — R634 Abnormal weight loss: Secondary | ICD-10-CM

## 2015-04-29 DIAGNOSIS — B20 Human immunodeficiency virus [HIV] disease: Secondary | ICD-10-CM | POA: Diagnosis not present

## 2015-04-29 DIAGNOSIS — Z87891 Personal history of nicotine dependence: Secondary | ICD-10-CM | POA: Diagnosis not present

## 2015-04-29 DIAGNOSIS — Y9301 Activity, walking, marching and hiking: Secondary | ICD-10-CM | POA: Diagnosis not present

## 2015-04-29 DIAGNOSIS — Y998 Other external cause status: Secondary | ICD-10-CM | POA: Insufficient documentation

## 2015-04-29 DIAGNOSIS — X58XXXA Exposure to other specified factors, initial encounter: Secondary | ICD-10-CM | POA: Diagnosis not present

## 2015-04-29 DIAGNOSIS — Z792 Long term (current) use of antibiotics: Secondary | ICD-10-CM | POA: Insufficient documentation

## 2015-04-29 DIAGNOSIS — S93401A Sprain of unspecified ligament of right ankle, initial encounter: Secondary | ICD-10-CM

## 2015-04-29 DIAGNOSIS — Z79899 Other long term (current) drug therapy: Secondary | ICD-10-CM | POA: Diagnosis not present

## 2015-04-29 DIAGNOSIS — Z880 Allergy status to penicillin: Secondary | ICD-10-CM | POA: Insufficient documentation

## 2015-04-29 DIAGNOSIS — S99911A Unspecified injury of right ankle, initial encounter: Secondary | ICD-10-CM | POA: Diagnosis present

## 2015-04-29 IMAGING — CR DG FOOT COMPLETE 3+V*R*
3 series · 3 of 3 positions shown · non-contrast
Comparison: None.

CLINICAL DATA: Pain secondary to a twisting injury.

EXAM:
RIGHT FOOT COMPLETE - 3+ VIEW

[x foot lat right]
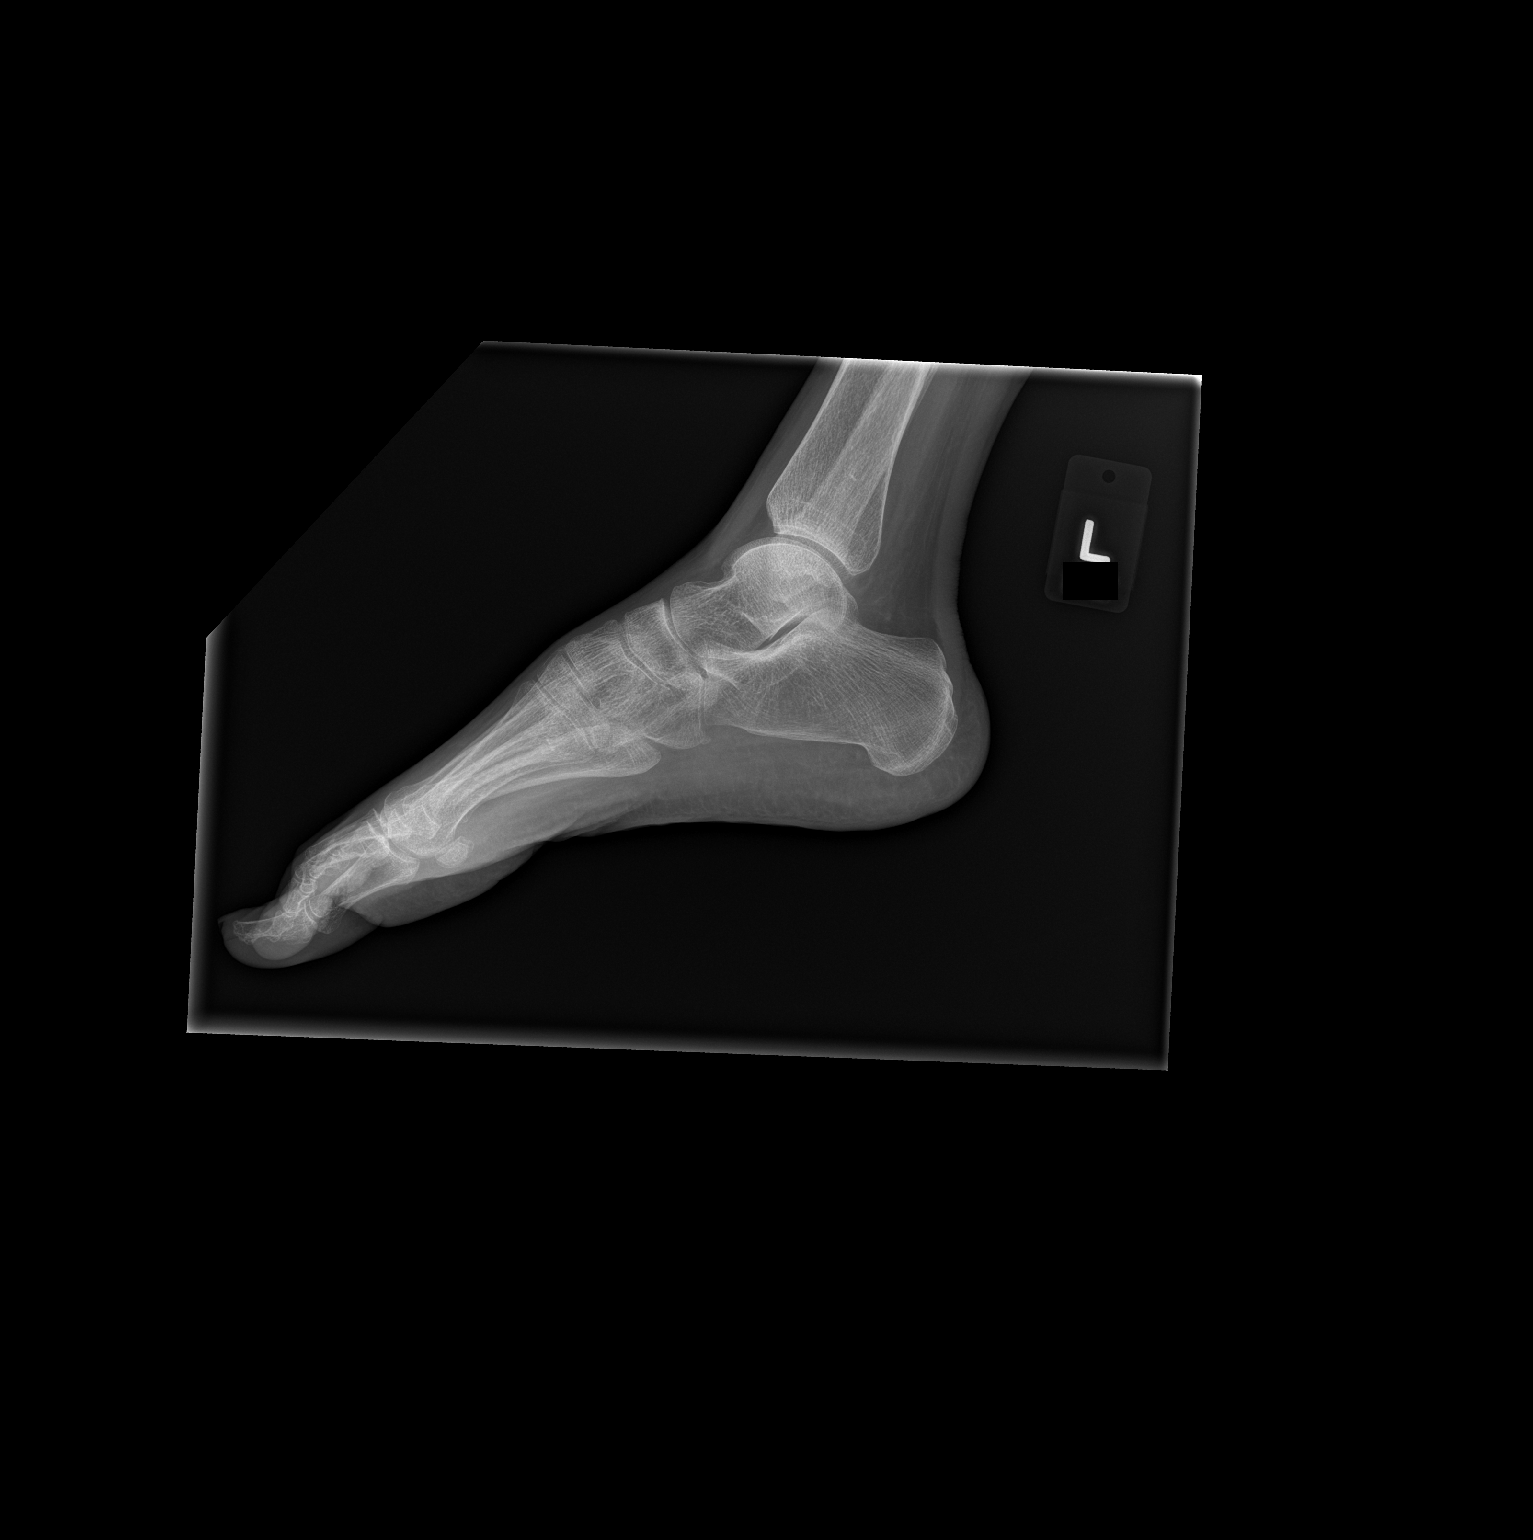

[x foot ap right]
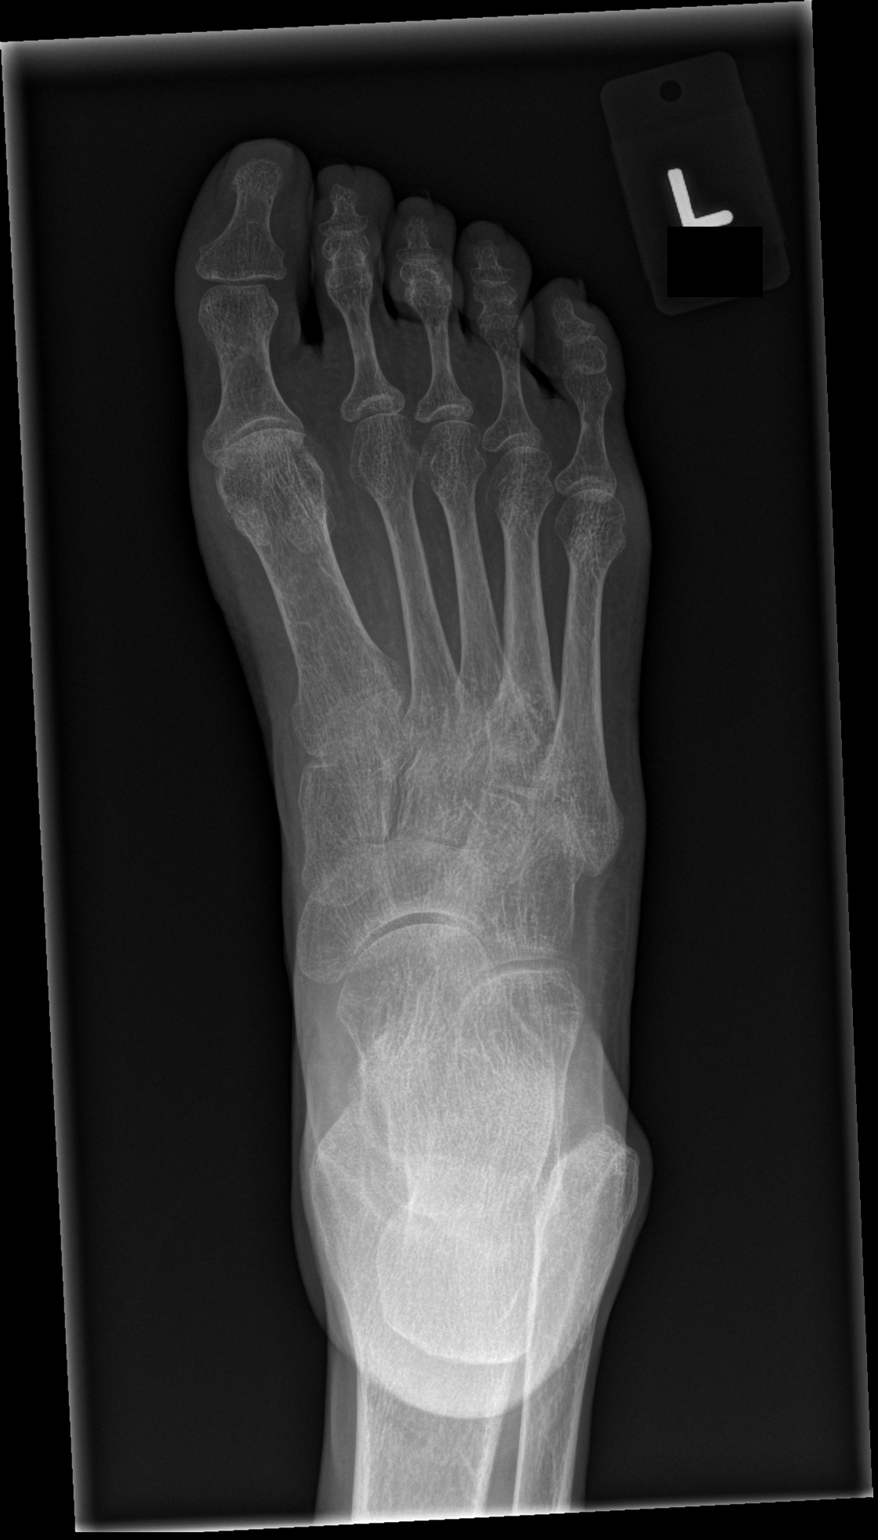

[x foot obl right]
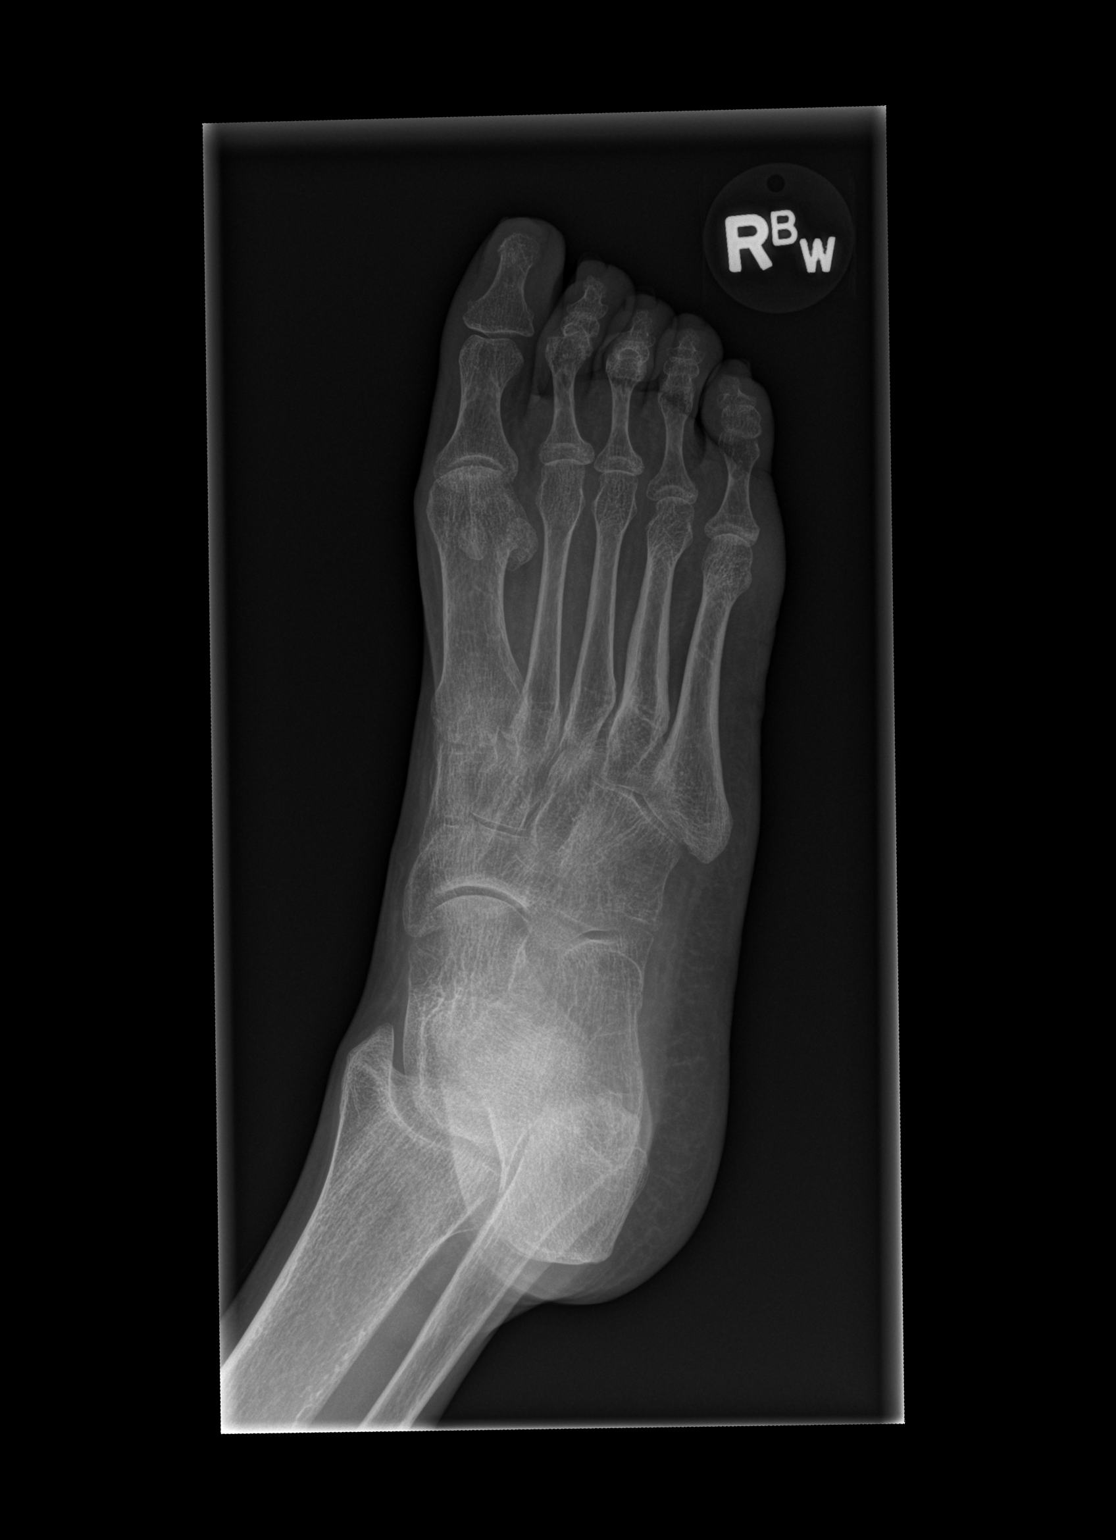

[3 of 3 positions shown; findings below may reference images not displayed]

FINDINGS: There is diffuse osteopenia. No fracture or dislocation. Minimal
degenerative changes at the first metatarsal phalangeal joint.
IMPRESSION: Osteopenia.  No acute abnormality.

## 2015-04-29 MED ORDER — OXYCODONE-ACETAMINOPHEN 5-325 MG PO TABS: 1.0000 | ORAL_TABLET | Freq: Four times a day (QID) | ORAL | Status: DC | PRN

## 2015-04-29 MED ORDER — OXYCODONE-ACETAMINOPHEN 5-325 MG PO TABS
2.0000 | ORAL_TABLET | Freq: Once | ORAL | Status: AC
  Administered 2015-04-29: 2 via ORAL
  Filled 2015-04-29: qty 2

## 2015-04-29 MED ORDER — ENSURE PO LIQD: 237.0000 mL | Freq: Three times a day (TID) | ORAL | Status: DC

## 2015-04-29 NOTE — ED Provider Notes (Signed)
CSN: [id number]     Arrival date & time 04/29/15  1147 History   First MD Initiated Contact with Patient 04/29/15 1153     Chief Complaint  Patient presents with  . Ankle Injury     (Consider location/radiation/quality/duration/timing/severity/associated sxs/prior Treatment) HPI Comments: Patient presents to the emergency department with chief complaint of right ankle pain. She states that she missed a step this morning and rolled her ankle. She has a past medical history remarkable for stroke. She states that because of this she has residual weakness in her right lower extremity have baseline. She reports moderate pain in the ankle and the forefoot. She has not taken anything to alleviate the symptoms. Symptoms are aggravated with palpation and movement.  The history is provided by the patient. No language interpreter was used.    Past Medical History  Diagnosis Date  . HIV infection   . Substance abuse     history, clean 7 years   History reviewed. No pertinent past surgical history. No family history on file. History  Substance Use Topics  . Smoking status: Former Smoker -- 0.40 packs/day    Types: Cigarettes    Start date: 12/18/2012  . Smokeless tobacco: Never Used  . Alcohol Use: No   OB History    No data available     Review of Systems  Constitutional: Negative for fever and chills.  Respiratory: Negative for shortness of breath.   Cardiovascular: Negative for chest pain.  Gastrointestinal: Negative for nausea, vomiting, diarrhea and constipation.  Genitourinary: Negative for dysuria.  Musculoskeletal: Positive for arthralgias.  All other systems reviewed and are negative.     Allergies  Codeine; Penicillins; and Chantix  Home Medications   Prior to Admission medications   Medication Sig Start Date End Date Taking? Authorizing Provider  amLODipine (NORVASC) 10 MG tablet Take 1 tablet (10 mg total) by mouth daily. 12/31/14   Carlyle Basques, MD   atorvastatin (LIPITOR) 20 MG tablet Take 1 tablet (20 mg total) by mouth daily. 04/07/14   Carlyle Basques, MD  clindamycin (CLEOCIN) 300 MG capsule Take 1 capsule (300 mg total) by mouth 3 (three) times daily. 03/15/15   Truman Hayward, MD  dronabinol (MARINOL) 10 MG capsule Take 1 capsule (10 mg total) by mouth 2 (two) times daily before a meal. 02/17/14   Carlyle Basques, MD  elvitegravir-cobicistat-emtricitabine-tenofovir (STRIBILD) 150-150-200-300 MG TABS tablet Take 1 tablet by mouth daily with breakfast. 03/05/15   Carlyle Basques, MD  ENSURE (ENSURE) Take 237 mLs by mouth 3 (three) times daily between meals. 06/22/14   Carlyle Basques, MD  methocarbamol (ROBAXIN) 500 MG tablet Take 1 tablet (500 mg total) by mouth every 8 (eight) hours as needed for muscle spasms. 04/13/15   Carlyle Basques, MD  omeprazole (PRILOSEC) 20 MG capsule Take 1 capsule (20 mg total) by mouth daily. 01/18/15   Carlyle Basques, MD  oxyCODONE-acetaminophen (PERCOCET/ROXICET) 5-325 MG per tablet Take 1 tablet by mouth every 8 (eight) hours as needed for severe pain. 02/04/15   Thayer Headings, MD  traMADol (ULTRAM) 50 MG tablet Take 1 tablet (50 mg total) by mouth 2 (two) times daily. 03/15/15   Truman Hayward, MD   BP 118/76 mmHg  Pulse 86  Temp(Src) 97.8 F (36.6 C) (Oral)  SpO2 99% Physical Exam  Constitutional: She is oriented to person, place, and time. She appears well-developed and well-nourished.  HENT:  Head: Normocephalic and atraumatic.  Eyes: Conjunctivae and EOM are normal.  Neck: Normal range of motion.  Cardiovascular: Normal rate and intact distal pulses.   Brisk capillary refill  Pulmonary/Chest: Effort normal.  Abdominal: She exhibits no distension.  Musculoskeletal: Normal range of motion.  Moderate right-sided ankle pain, tenderness to palpation over the medial and lateral malleolus, mild swelling, no bony abnormality or deformity, range of motion and strength limited secondary to prior stroke and  pain  Neurological: She is alert and oriented to person, place, and time.  Sensation intact  Skin: Skin is dry.  Psychiatric: She has a normal mood and affect. Her behavior is normal. Judgment and thought content normal.  Nursing note and vitals reviewed.   ED Course  Procedures (including critical care time) Labs Review Labs Reviewed - No data to display  Imaging Review Dg Ankle Complete Right  04/29/2015   CLINICAL DATA:  Ankle pain secondary to a twisting injury.  EXAM: RIGHT ANKLE - COMPLETE 3+ VIEW  COMPARISON:  None.  FINDINGS: Osteopenia.  No fracture or dislocation or joint effusion.  IMPRESSION: No acute abnormality.  Osteopenia.   Electronically Signed   By: Lorriane Shire M.D.   On: 04/29/2015 13:18   Dg Foot Complete Right  04/29/2015   CLINICAL DATA:  Pain secondary to a twisting injury.  EXAM: RIGHT FOOT COMPLETE - 3+ VIEW  COMPARISON:  None.  FINDINGS: There is diffuse osteopenia. No fracture or dislocation. Minimal degenerative changes at the first metatarsal phalangeal joint.  IMPRESSION: Osteopenia.  No acute abnormality.   Electronically Signed   By: Lorriane Shire M.D.   On: 04/29/2015 13:17     EKG Interpretation None      MDM   Final diagnoses:  Ankle sprain, right, initial encounter    Patient with probable right ankle sprain. Will check plain films to rule out fracture. We'll treat pain. Plan for discharge to home with ankle brace and orthopedic follow-up.   Plain films are reassuring. Will treat for ankle sprain. Will give ankle brace and crutches. Follow-up with orthopedics. Patient understands and agrees to plan. She is stable and ready for discharge.   Montine Circle, PA-C 04/29/15 1353  Davonna Belling, MD 04/30/15 [phone]

## 2015-04-29 NOTE — Telephone Encounter (Signed)
Patient dropped forms by clinic today. Advised her will call once they are complete.

## 2015-04-29 NOTE — Discharge Instructions (Signed)

## 2015-04-29 NOTE — ED Notes (Signed)
Pt sts that this morning she was walking down two steps to take the trash out and missed the bottom step and rolled over her R ankle. Pt c/o 8/10 pain. Pt able to bear weight with pain. A&Ox4. No obvious deformity or swelling noted to R foot or ankle. Pt denies tenderness to palpation, but pain across top of foot and in ankle when weight bearing.

## 2015-05-31 IMAGING — CR DG ANKLE COMPLETE 3+V*R*
3 series · 3 of 3 positions shown · non-contrast
Comparison: None.

CLINICAL DATA: Ankle pain secondary to a twisting injury.

EXAM:
RIGHT ANKLE - COMPLETE 3+ VIEW

[x ankle ap right]
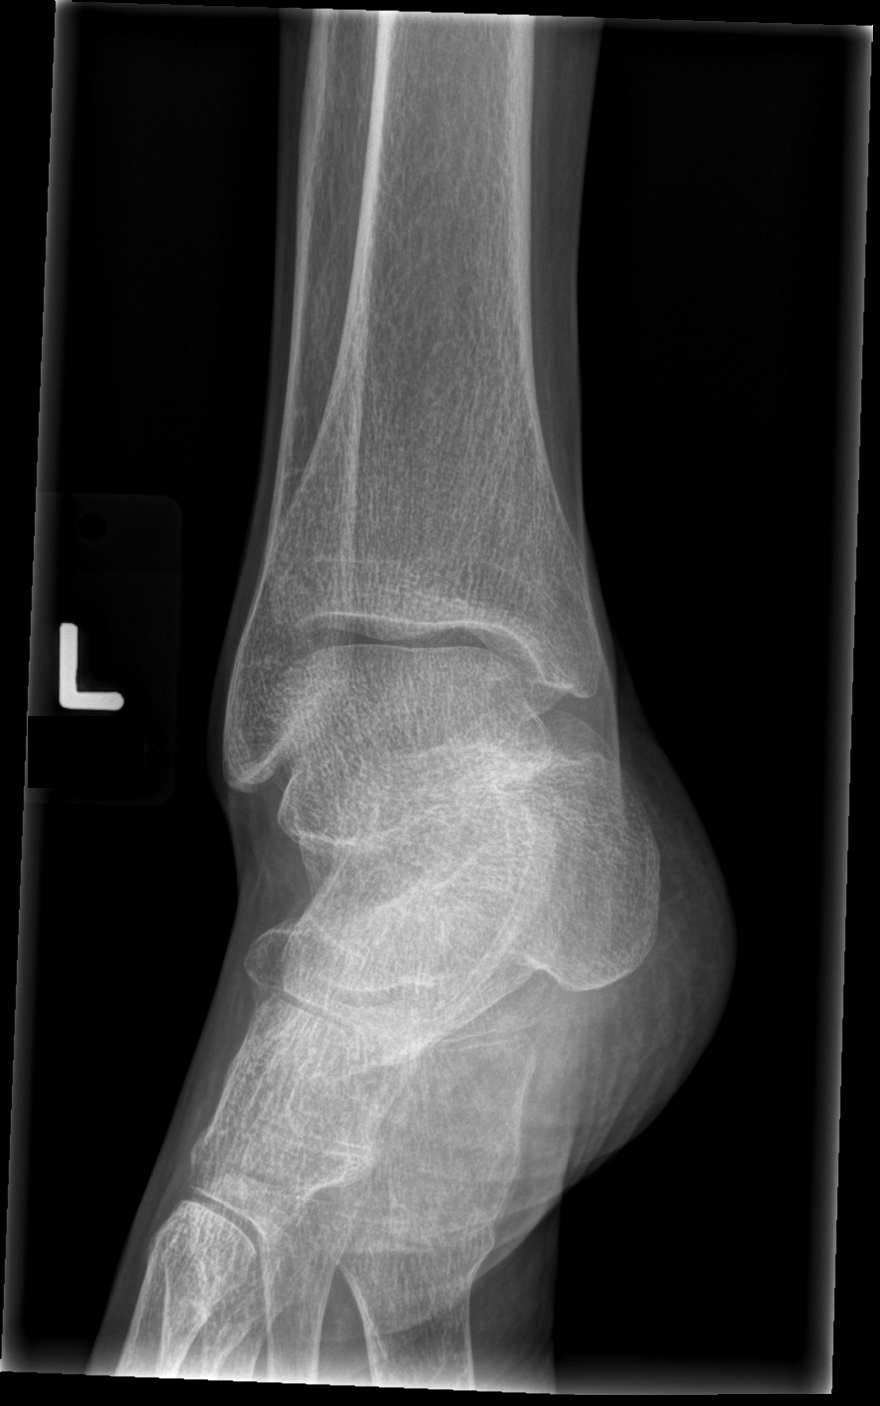

[x ankle obl right]
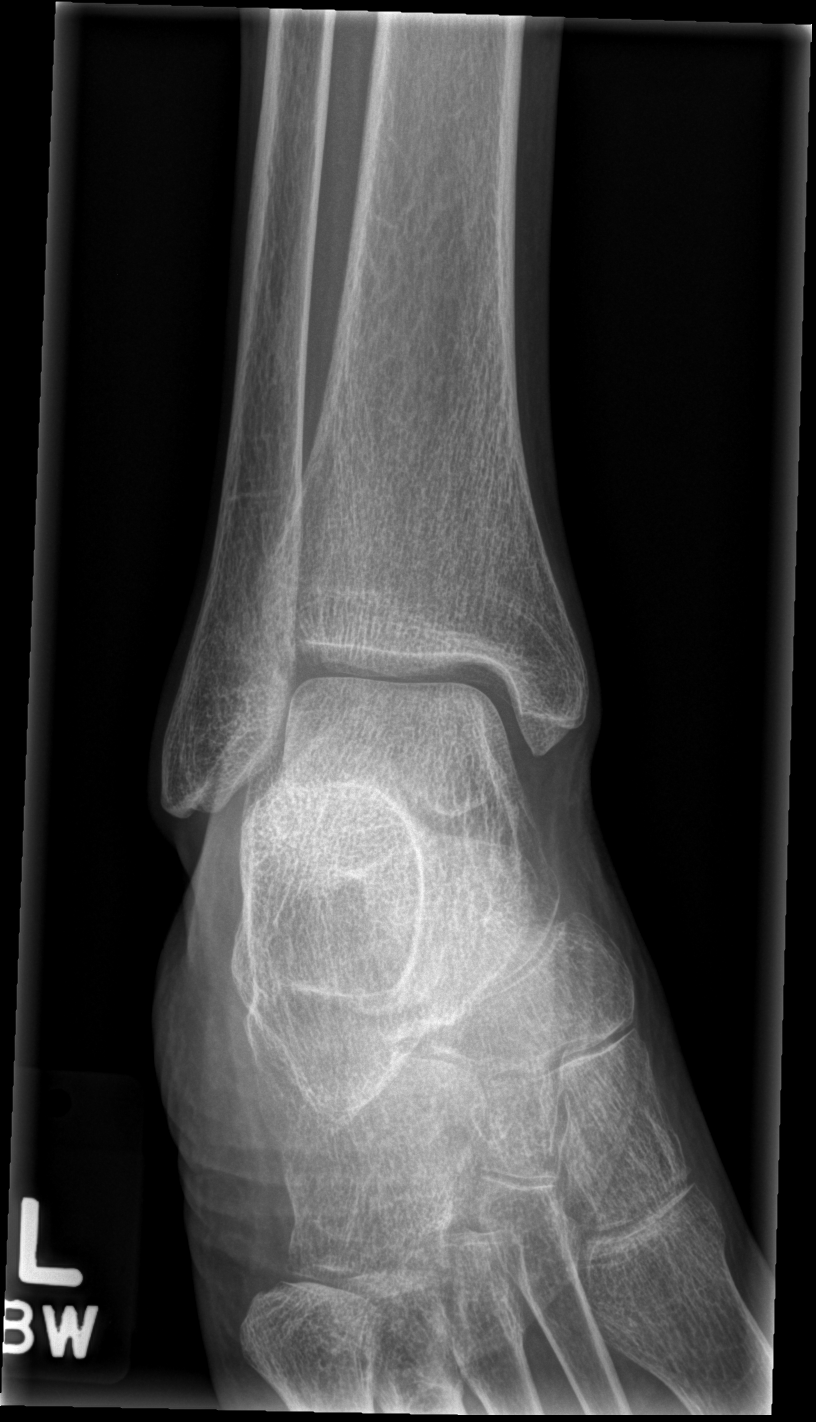

[x ankle lat right]
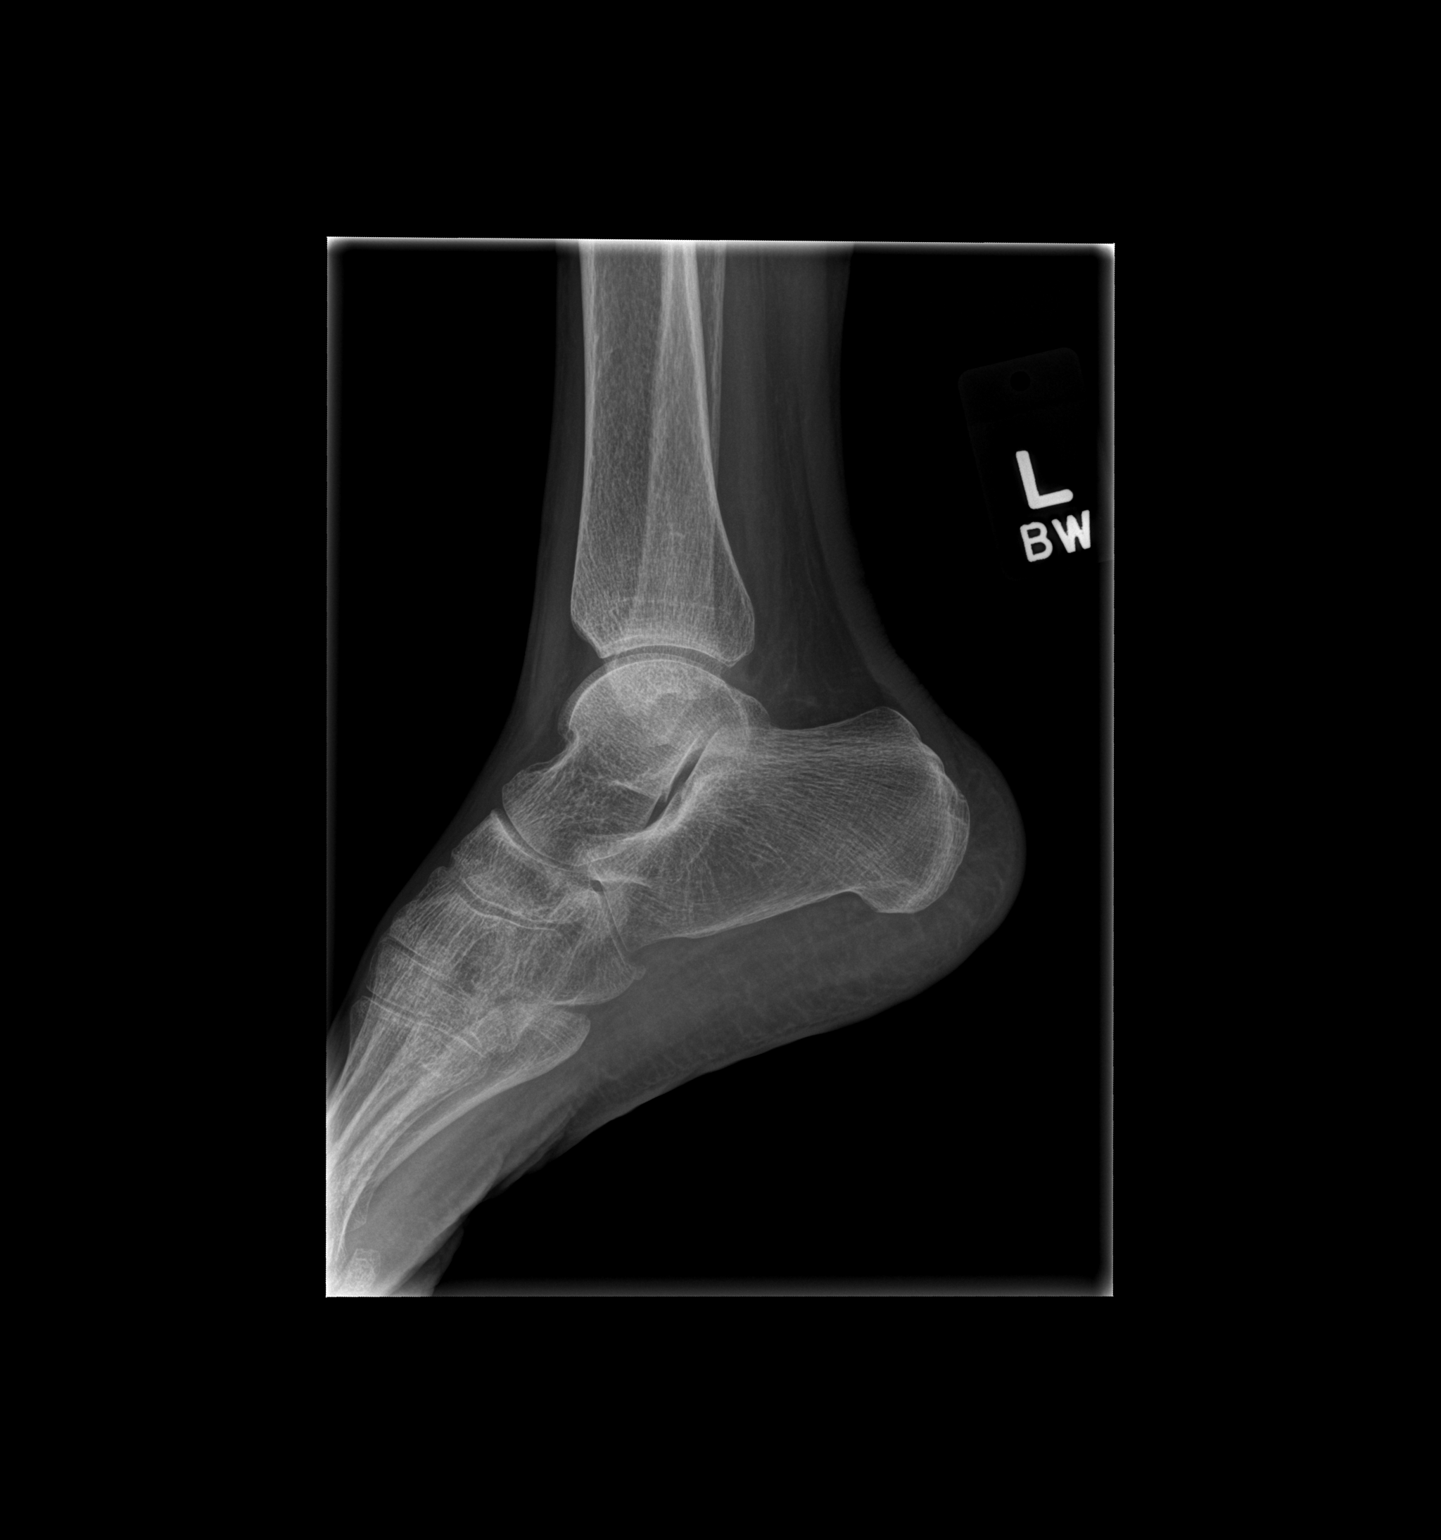

[3 of 3 positions shown; findings below may reference images not displayed]

FINDINGS: Osteopenia.  No fracture or dislocation or joint effusion.
IMPRESSION: No acute abnormality.  Osteopenia.

## 2015-07-13 ENCOUNTER — Ambulatory Visit: Payer: Medicaid Other | Admitting: Internal Medicine

## 2015-07-20 ENCOUNTER — Encounter (HOSPITAL_COMMUNITY): Payer: Self-pay

## 2015-07-20 ENCOUNTER — Emergency Department (HOSPITAL_COMMUNITY)
Admission: EM | Admit: 2015-07-20 | Discharge: 2015-07-21 | Disposition: A | Discharge status: Home / Self Care | Payer: Medicaid Other | Attending: Emergency Medicine | Admitting: Emergency Medicine

## 2015-07-20 DIAGNOSIS — K649 Unspecified hemorrhoids: Secondary | ICD-10-CM | POA: Diagnosis not present

## 2015-07-20 DIAGNOSIS — Z79899 Other long term (current) drug therapy: Secondary | ICD-10-CM | POA: Insufficient documentation

## 2015-07-20 DIAGNOSIS — Z880 Allergy status to penicillin: Secondary | ICD-10-CM | POA: Insufficient documentation

## 2015-07-20 DIAGNOSIS — Z21 Asymptomatic human immunodeficiency virus [HIV] infection status: Secondary | ICD-10-CM | POA: Insufficient documentation

## 2015-07-20 DIAGNOSIS — K5900 Constipation, unspecified: Secondary | ICD-10-CM

## 2015-07-20 DIAGNOSIS — Z87891 Personal history of nicotine dependence: Secondary | ICD-10-CM | POA: Insufficient documentation

## 2015-07-20 DIAGNOSIS — I10 Essential (primary) hypertension: Secondary | ICD-10-CM | POA: Insufficient documentation

## 2015-07-20 HISTORY — DX: Essential (primary) hypertension: I10

## 2015-07-20 NOTE — ED Provider Notes (Signed)
CSN: [id number]     Arrival date & time 07/20/15  2245 History  This chart was scribed for Linton Flemings, MD by Meriel Pica, ED Scribe. This patient was seen in room D33C/D33C and the patient's care was started 12:03 AM.   Chief Complaint  Patient presents with  . Hemorrhoids   The history is provided by the patient. No language interpreter was used.   HPI Comments: Janice Williams is a 54 y.o. female, with a PMhx of HIV and HTN, brought in by ambulance, who presents to the Emergency Department complaining of sudden onset, constant, moderate burning pain in rectal region that began this evening. PT reports associated constipation over the past 2 days for which she took a Magnesium Citrate laxative for 2 days ago. Pt believes she has strained herself while trying to have a BM over the course of her symptoms. She soaked the affected area in hot water tonight and has since been experiencing the burning pain. Pt reports after soaking the area in hot water, when she looked in the mirror this evening she saw 'something hanging' from her rectal region. Denies fevers.   Past Medical History  Diagnosis Date  . HIV infection   . Substance abuse     history, clean 7 years  . Hypertension    History reviewed. No pertinent past surgical history. No family history on file. History  Substance Use Topics  . Smoking status: Former Smoker -- 0.40 packs/day    Types: Cigarettes    Start date: 12/18/2012  . Smokeless tobacco: Never Used  . Alcohol Use: Yes     Comment: "occasional"    OB History    No data available     Review of Systems  Constitutional: Negative for fever.  Gastrointestinal: Positive for constipation.  All other systems reviewed and are negative.  Allergies  Codeine; Penicillins; and Chantix  Home Medications   Prior to Admission medications   Medication Sig Start Date End Date Taking? Authorizing Provider  amLODipine (NORVASC) 10 MG tablet Take 1 tablet (10 mg total) by  mouth daily. 12/31/14   Carlyle Basques, MD  atorvastatin (LIPITOR) 20 MG tablet Take 1 tablet (20 mg total) by mouth daily. 04/07/14   Carlyle Basques, MD  clindamycin (CLEOCIN) 300 MG capsule Take 1 capsule (300 mg total) by mouth 3 (three) times daily. 03/15/15   Truman Hayward, MD  dronabinol (MARINOL) 10 MG capsule Take 1 capsule (10 mg total) by mouth 2 (two) times daily before a meal. 02/17/14   Carlyle Basques, MD  elvitegravir-cobicistat-emtricitabine-tenofovir (STRIBILD) 150-150-200-300 MG TABS tablet Take 1 tablet by mouth daily with breakfast. 03/05/15   Carlyle Basques, MD  ENSURE (ENSURE) Take 237 mLs by mouth 3 (three) times daily between meals. 04/29/15   Carlyle Basques, MD  methocarbamol (ROBAXIN) 500 MG tablet Take 1 tablet (500 mg total) by mouth every 8 (eight) hours as needed for muscle spasms. 04/13/15   Carlyle Basques, MD  omeprazole (PRILOSEC) 20 MG capsule Take 1 capsule (20 mg total) by mouth daily. 01/18/15   Carlyle Basques, MD  oxyCODONE-acetaminophen (PERCOCET/ROXICET) 5-325 MG per tablet Take 1 tablet by mouth every 6 (six) hours as needed for severe pain. 04/29/15   Montine Circle, PA-C  traMADol (ULTRAM) 50 MG tablet Take 1 tablet (50 mg total) by mouth 2 (two) times daily. 03/15/15   Truman Hayward, MD   BP 125/88 mmHg  Pulse 92  Temp(Src) 97.6 F (36.4 C) (Oral)  Resp  18  Ht 5\' 4"  (1.626 m)  Wt 98 lb (44.453 kg)  BMI 16.81 kg/m2  SpO2 100% Physical Exam  Constitutional: She is oriented to person, place, and time. She appears well-developed and well-nourished. No distress.  HENT:  Head: Normocephalic and atraumatic.  Nose: Nose normal.  Mouth/Throat: Oropharynx is clear and moist.  Eyes: Conjunctivae and EOM are normal. Pupils are equal, round, and reactive to light. Right eye exhibits no discharge. Left eye exhibits no discharge. No scleral icterus.  Neck: Normal range of motion. Neck supple. No JVD present. No tracheal deviation present. No thyromegaly  present.  Cardiovascular: Normal rate, regular rhythm, normal heart sounds and intact distal pulses.  Exam reveals no gallop and no friction rub.   No murmur heard. Pulmonary/Chest: Effort normal and breath sounds normal. No stridor. No respiratory distress. She has no wheezes. She has no rales. She exhibits no tenderness.  Abdominal: Soft. Bowel sounds are normal. She exhibits no distension and no mass. There is no tenderness. There is no rebound and no guarding.  Genitourinary:  Hemorrhoid at 6:00, nonthrombosed  Musculoskeletal: Normal range of motion. She exhibits no edema or tenderness.  Lymphadenopathy:    She has no cervical adenopathy.  Neurological: She is alert and oriented to person, place, and time. She displays normal reflexes. She exhibits normal muscle tone. Coordination normal.  Skin: Skin is warm and dry. No rash noted. No erythema. No pallor.  Psychiatric: She has a normal mood and affect. Her behavior is normal. Judgment and thought content normal.  Nursing note and vitals reviewed.   ED Course  Procedures  DIAGNOSTIC STUDIES: Oxygen Saturation is 100% on RA, normal by my interpretation.    COORDINATION OF CARE: 12:07 AM Discussed treatment plan which includes to perform a rectal exam with pt. Will order hydrocortisone suppository 25mg  and prescribe Miralax/Glycolax, pramoxine 1%, and Witch Hazel pads. Advised pt to soak affected area in luke warm water, especially post BM. Pt acknowledges and agrees to plan.   Labs Review Labs Reviewed - No data to display  Imaging Review No results found.   EKG Interpretation None      MDM   Final diagnoses:  Constipation, unspecified constipation type  Acute hemorrhoid    54 year old female with rectal pain after straining for bowel movement.  Hemorrhoid noted on exam.  Nonthrombosed.  Symptomatic care.  I personally performed the services described in this documentation, which was scribed in my presence. The recorded  information has been reviewed and is accurate.     Linton Flemings, MD 07/21/15 [phone]

## 2015-07-20 NOTE — ED Notes (Signed)
Per EMS pt has had constipation x 2 days; Pt c/o pushing hard during BM and feels that something "is out that should be in"; pt c/o burning and pain; No active bleeding noted but pt states blood on toilet tissue when she wipes.

## 2015-07-21 ENCOUNTER — Other Ambulatory Visit: Payer: Self-pay | Admitting: *Deleted

## 2015-07-21 DIAGNOSIS — K2100 Gastro-esophageal reflux disease with esophagitis, without bleeding: Secondary | ICD-10-CM

## 2015-07-21 MED ORDER — PRAMOXINE HCL 1 % RE FOAM: 1.0000 "application " | Freq: Three times a day (TID) | RECTAL | Status: DC | PRN

## 2015-07-21 MED ORDER — OMEPRAZOLE 20 MG PO CPDR: 20.0000 mg | DELAYED_RELEASE_CAPSULE | Freq: Every day | ORAL | Status: DC

## 2015-07-21 MED ORDER — TUCKS 50 % EX PADS: MEDICATED_PAD | CUTANEOUS | Status: DC

## 2015-07-21 MED ORDER — HYDROCORTISONE ACETATE 25 MG RE SUPP
25.0000 mg | Freq: Once | RECTAL | Status: AC
  Administered 2015-07-21: 25 mg via RECTAL
  Filled 2015-07-21: qty 1

## 2015-07-21 MED ORDER — POLYETHYLENE GLYCOL 3350 17 G PO PACK: PACK | ORAL | Status: DC

## 2015-07-21 NOTE — ED Notes (Signed)
MD at bedside. 

## 2015-07-21 NOTE — Discharge Instructions (Signed)
Constipation °Constipation is when a person has fewer than three bowel movements a week, has difficulty having a bowel movement, or has stools that are dry, hard, or larger than normal. As people grow older, constipation is more common. If you try to fix constipation with medicines that make you have a bowel movement (laxatives), the problem may get worse. Long-term laxative use may cause the muscles of the colon to become weak. A low-fiber diet, not taking in enough fluids, and taking certain medicines may make constipation worse.  °CAUSES  °· Certain medicines, such as antidepressants, pain medicine, iron supplements, antacids, and water pills.   °· Certain diseases, such as diabetes, irritable bowel syndrome (IBS), thyroid disease, or depression.   °· Not drinking enough water.   °· Not eating enough fiber-rich foods.   °· Stress or travel.   °· Lack of physical activity or exercise.   °· Ignoring the urge to have a bowel movement.   °· Using laxatives too much.   °SIGNS AND SYMPTOMS  °· Having fewer than three bowel movements a week.   °· Straining to have a bowel movement.   °· Having stools that are hard, dry, or larger than normal.   °· Feeling full or bloated.   °· Pain in the lower abdomen.   °· Not feeling relief after having a bowel movement.   °DIAGNOSIS  °Your health care provider will take a medical history and perform a physical exam. Further testing may be done for severe constipation. Some tests may include: °· A barium enema X-ray to examine your rectum, colon, and, sometimes, your small intestine.   °· A sigmoidoscopy to examine your lower colon.   °· A colonoscopy to examine your entire colon. °TREATMENT  °Treatment will depend on the severity of your constipation and what is causing it. Some dietary treatments include drinking more fluids and eating more fiber-rich foods. Lifestyle treatments may include regular exercise. If these diet and lifestyle recommendations do not help, your health care  provider may recommend taking over-the-counter laxative medicines to help you have bowel movements. Prescription medicines may be prescribed if over-the-counter medicines do not work.  °HOME CARE INSTRUCTIONS  °· Eat foods that have a lot of fiber, such as fruits, vegetables, whole grains, and beans. °· Limit foods high in fat and processed sugars, such as french fries, hamburgers, cookies, candies, and soda.   °· A fiber supplement may be added to your diet if you cannot get enough fiber from foods.   °· Drink enough fluids to keep your urine clear or pale yellow.   °· Exercise regularly or as directed by your health care provider.   °· Go to the restroom when you have the urge to go. Do not hold it.   °· Only take over-the-counter or prescription medicines as directed by your health care provider. Do not take other medicines for constipation without talking to your health care provider first.   °SEEK IMMEDIATE MEDICAL CARE IF:  °· You have bright red blood in your stool.   °· Your constipation lasts for more than 4 days or gets worse.   °· You have abdominal or rectal pain.   °· You have thin, pencil-like stools.   °· You have unexplained weight loss. °MAKE SURE YOU:  °· Understand these instructions. °· Will watch your condition. °· Will get help right away if you are not doing well or get worse. °Document Released: 09/01/2004 Document Revised: 12/09/2013 Document Reviewed: 09/15/2013 °[employer]® Patient Information ©2015 [employer]. This information is not intended to replace advice given to you by your health care provider. Make sure you discuss any questions   you have with your health care provider.  Hemorrhoids Hemorrhoids are swollen veins around the rectum or anus. There are two types of hemorrhoids:   Internal hemorrhoids. These occur in the veins just inside the rectum. They may poke through to the outside and become irritated and painful.  External hemorrhoids. These occur in the veins outside  the anus and can be felt as a painful swelling or hard lump near the anus. CAUSES  Pregnancy.   Obesity.   Constipation or diarrhea.   Straining to have a bowel movement.   Sitting for long periods on the toilet.  Heavy lifting or other activity that caused you to strain.  Anal intercourse. SYMPTOMS   Pain.   Anal itching or irritation.   Rectal bleeding.   Fecal leakage.   Anal swelling.   One or more lumps around the anus.  DIAGNOSIS  Your caregiver may be able to diagnose hemorrhoids by visual examination. Other examinations or tests that may be performed include:   Examination of the rectal area with a gloved hand (digital rectal exam).   Examination of anal canal using a small tube (scope).   A blood test if you have lost a significant amount of blood.  A test to look inside the colon (sigmoidoscopy or colonoscopy). TREATMENT Most hemorrhoids can be treated at home. However, if symptoms do not seem to be getting better or if you have a lot of rectal bleeding, your caregiver may perform a procedure to help make the hemorrhoids get smaller or remove them completely. Possible treatments include:   Placing a rubber band at the base of the hemorrhoid to cut off the circulation (rubber band ligation).   Injecting a chemical to shrink the hemorrhoid (sclerotherapy).   Using a tool to burn the hemorrhoid (infrared light therapy).   Surgically removing the hemorrhoid (hemorrhoidectomy).   Stapling the hemorrhoid to block blood flow to the tissue (hemorrhoid stapling).  HOME CARE INSTRUCTIONS   Eat foods with fiber, such as whole grains, beans, nuts, fruits, and vegetables. Ask your doctor about taking products with added fiber in them (fibersupplements).  Increase fluid intake. Drink enough water and fluids to keep your urine clear or pale yellow.   Exercise regularly.   Go to the bathroom when you have the urge to have a bowel movement. Do  not wait.   Avoid straining to have bowel movements.   Keep the anal area dry and clean. Use wet toilet paper or moist towelettes after a bowel movement.   Medicated creams and suppositories may be used or applied as directed.   Only take over-the-counter or prescription medicines as directed by your caregiver.   Take warm sitz baths for 15-20 minutes, 3-4 times a day to ease pain and discomfort.   Place ice packs on the hemorrhoids if they are tender and swollen. Using ice packs between sitz baths may be helpful.   Put ice in a plastic bag.   Place a towel between your skin and the bag.   Leave the ice on for 15-20 minutes, 3-4 times a day.   Do not use a donut-shaped pillow or sit on the toilet for long periods. This increases blood pooling and pain.  SEEK MEDICAL CARE IF:  You have increasing pain and swelling that is not controlled by treatment or medicine.  You have uncontrolled bleeding.  You have difficulty or you are unable to have a bowel movement.  You have pain or inflammation outside the area of  the hemorrhoids. MAKE SURE YOU:  Understand these instructions.  Will watch your condition.  Will get help right away if you are not doing well or get worse. Document Released: 12/01/2000 Document Revised: 11/20/2012 Document Reviewed: 10/08/2012 [facility] Patient Information 2015 [place], [place]. This information is not intended to replace advice given to you by your health care provider. Make sure you discuss any questions you have with your health care provider.

## 2015-07-26 ENCOUNTER — Other Ambulatory Visit: Payer: Self-pay | Admitting: Licensed Clinical Social Worker

## 2015-07-26 DIAGNOSIS — R634 Abnormal weight loss: Secondary | ICD-10-CM

## 2015-07-26 MED ORDER — ENSURE PO LIQD: 237.0000 mL | Freq: Three times a day (TID) | ORAL | Status: DC

## 2015-08-16 ENCOUNTER — Emergency Department (HOSPITAL_COMMUNITY)
Admission: EM | Admit: 2015-08-16 | Discharge: 2015-08-16 | Disposition: A | Discharge status: Home / Self Care | Payer: Medicaid Other | Source: Home / Self Care | Attending: Family Medicine | Admitting: Family Medicine

## 2015-08-16 ENCOUNTER — Encounter (HOSPITAL_COMMUNITY): Payer: Self-pay | Admitting: Emergency Medicine

## 2015-08-16 DIAGNOSIS — L729 Follicular cyst of the skin and subcutaneous tissue, unspecified: Secondary | ICD-10-CM | POA: Diagnosis not present

## 2015-08-16 NOTE — Discharge Instructions (Signed)
Epidermal Cyst An epidermal cyst is usually a small, painless lump under the skin. Cysts often occur on the face, neck, stomach, chest, or genitals. The cyst may be filled with a bad smelling paste. Do not pop your cyst. Popping the cyst can cause pain and puffiness (swelling). HOME CARE   Only take medicines as told by your doctor.   GET HELP RIGHT AWAY IF:  Your cyst is tender, red, or puffy.  You are not getting better, or you are getting worse.  You have any questions or concerns. MAKE SURE YOU:  Understand these instructions.  Will watch your condition.  Will get help right away if you are not doing well or get worse. Document Released: 01/11/2005 Document Revised: 06/04/2012 Document Reviewed: 06/12/2011 [facility] Patient Information 2015 [place], [place]. This information is not intended to replace advice given to you by your health care provider. Make sure you discuss any questions you have with your health care provider.

## 2015-08-16 NOTE — ED Provider Notes (Signed)
CSN: [id number]     Arrival date & time 08/16/15  1529 History   First MD Initiated Contact with Patient 08/16/15 1641     Chief Complaint  Patient presents with  . Cyst   (Consider location/radiation/quality/duration/timing/severity/associated sxs/prior Treatment) HPI Comments: 54 year old female with history of HIV, substance abuse and hypertension presents requesting that she have several cysts to her forehead and face excised. She has seen her PCP and was referred to a dermatologist. Neither physician would excise the lesions. They have been present for at least 7 months.   they are becomingmildly tender. One is located to the forehead a second one located to the lateral aspect of the eyebrow and others to the face.   Past Medical History  Diagnosis Date  . HIV infection   . Substance abuse     history, clean 7 years  . Hypertension    History reviewed. No pertinent past surgical history. No family history on file. Social History  Substance Use Topics  . Smoking status: Former Smoker -- 0.40 packs/day    Types: Cigarettes    Start date: 12/18/2012  . Smokeless tobacco: Never Used  . Alcohol Use: Yes     Comment: "occasional"    OB History    No data available     Review of Systems  Constitutional: Negative.   All other systems reviewed and are negative.   Allergies  Codeine; Penicillins; and Chantix  Home Medications   Prior to Admission medications   Medication Sig Start Date End Date Taking? Authorizing Provider  amLODipine (NORVASC) 10 MG tablet Take 1 tablet (10 mg total) by mouth daily. 12/31/14  Yes Carlyle Basques, MD  omeprazole (PRILOSEC) 20 MG capsule Take 1 capsule (20 mg total) by mouth daily. 07/21/15  Yes Carlyle Basques, MD  atorvastatin (LIPITOR) 20 MG tablet Take 1 tablet (20 mg total) by mouth daily. 04/07/14   Carlyle Basques, MD  clindamycin (CLEOCIN) 300 MG capsule Take 1 capsule (300 mg total) by mouth 3 (three) times daily. 03/15/15   Truman Hayward, MD  dronabinol (MARINOL) 10 MG capsule Take 1 capsule (10 mg total) by mouth 2 (two) times daily before a meal. 02/17/14   Carlyle Basques, MD  elvitegravir-cobicistat-emtricitabine-tenofovir (STRIBILD) 150-150-200-300 MG TABS tablet Take 1 tablet by mouth daily with breakfast. 03/05/15   Carlyle Basques, MD  ENSURE (ENSURE) Take 237 mLs by mouth 3 (three) times daily between meals. 07/26/15   Carlyle Basques, MD  methocarbamol (ROBAXIN) 500 MG tablet Take 1 tablet (500 mg total) by mouth every 8 (eight) hours as needed for muscle spasms. 04/13/15   Carlyle Basques, MD  oxyCODONE-acetaminophen (PERCOCET/ROXICET) 5-325 MG per tablet Take 1 tablet by mouth every 6 (six) hours as needed for severe pain. 04/29/15   Montine Circle, PA-C  polyethylene glycol [facility] / GLYCOLAX) packet Take one capful in 8 oz of fluid of your choice twice a day until having soft stools, then back down to once a day.  If not having soft stools with twice a day, increased to three times a day 07/21/15   Linton Flemings, MD  pramoxine (PROCTOFOAM) 1 % foam Place 1 application rectally 3 (three) times daily as needed for itching. 07/21/15   Linton Flemings, MD  traMADol (ULTRAM) 50 MG tablet Take 1 tablet (50 mg total) by mouth 2 (two) times daily. 03/15/15   Truman Hayward, MD  Witch Hazel (TUCKS) 50 % PADS Use as needed for rectal itching, after having BM  07/21/15   Linton Flemings, MD   Meds Ordered and Administered this Visit  Medications - No data to display  BP 122/82 mmHg  Pulse 74  Temp(Src) 98.6 F (37 C) (Oral)  Resp 16  SpO2 98% No data found.   Physical Exam  Constitutional: She appears well-developed. No distress.  Neck: Normal range of motion. Neck supple.  Musculoskeletal: She exhibits no edema.  Neurological: She is alert. She exhibits normal muscle tone.  Skin: Skin is warm and dry.  The cyst described in history of present illness appear to be epidermal cyst. No erythema. No drainage.  Nursing note and vitals  reviewed.   ED Course  Procedures (including critical care time)  Labs Review Labs Reviewed - No data to display  Imaging Review No results found.   Visual Acuity Review  Right Eye Distance:   Left Eye Distance:   Bilateral Distance:    Right Eye Near:   Left Eye Near:    Bilateral Near:         MDM   1. Cyst of skin    Follow with your PCP for excision of the facial cysts    Janne Napoleon, NP 08/16/15 1727

## 2015-08-16 NOTE — ED Notes (Signed)
C/o 2 knots on forehead and on left side of face onset 6 months associated w/pain; 7/10 Alert... No acute distress.

## 2015-08-24 ENCOUNTER — Ambulatory Visit: Payer: Medicaid Other | Admitting: Internal Medicine

## 2015-09-21 ENCOUNTER — Telehealth: Payer: Self-pay | Admitting: *Deleted

## 2015-09-21 DIAGNOSIS — B20 Human immunodeficiency virus [HIV] disease: Secondary | ICD-10-CM

## 2015-09-21 MED ORDER — ELVITEG-COBIC-EMTRICIT-TENOFDF 150-150-200-300 MG PO TABS: 1.0000 | ORAL_TABLET | Freq: Every day | ORAL | Status: DC

## 2015-09-21 NOTE — Telephone Encounter (Signed)
stribild refilled.  Patient needs to reschedule September appointment.

## 2015-10-05 ENCOUNTER — Other Ambulatory Visit (HOSPITAL_COMMUNITY)
Admission: RE | Admit: 2015-10-05 | Discharge: 2015-10-05 | Disposition: A | Discharge status: Home / Self Care | Payer: Medicaid Other | Source: Ambulatory Visit | Attending: Internal Medicine | Admitting: Internal Medicine

## 2015-10-05 ENCOUNTER — Other Ambulatory Visit: Payer: Medicaid Other

## 2015-10-05 DIAGNOSIS — Z113 Encounter for screening for infections with a predominantly sexual mode of transmission: Secondary | ICD-10-CM

## 2015-10-05 DIAGNOSIS — B20 Human immunodeficiency virus [HIV] disease: Secondary | ICD-10-CM

## 2015-10-05 LAB — CBC WITH DIFFERENTIAL/PLATELET
Basophils Absolute: 0 10*3/uL (ref 0.0–0.1)
Basophils Relative: 0 % (ref 0–1)
Eosinophils Absolute: 0 10*3/uL (ref 0.0–0.7)
Eosinophils Relative: 1 % (ref 0–5)
HCT: 35.1 % — ABNORMAL LOW (ref 36.0–46.0)
Hemoglobin: 11.9 g/dL — ABNORMAL LOW (ref 12.0–15.0)
Lymphocytes Relative: 49 % — ABNORMAL HIGH (ref 12–46)
Lymphs Abs: 2.1 10*3/uL (ref 0.7–4.0)
MCH: 33.6 pg (ref 26.0–34.0)
MCHC: 33.9 g/dL (ref 30.0–36.0)
MCV: 99.2 fL (ref 78.0–100.0)
MPV: 9.2 fL (ref 8.6–12.4)
Monocytes Absolute: 0.3 10*3/uL (ref 0.1–1.0)
Monocytes Relative: 7 % (ref 3–12)
Neutro Abs: 1.8 10*3/uL (ref 1.7–7.7)
Neutrophils Relative %: 43 % (ref 43–77)
Platelets: 323 10*3/uL (ref 150–400)
RBC: 3.54 MIL/uL — ABNORMAL LOW (ref 3.87–5.11)
RDW: 13.4 % (ref 11.5–15.5)
WBC: 4.2 10*3/uL (ref 4.0–10.5)

## 2015-10-05 LAB — COMPLETE METABOLIC PANEL WITH GFR
ALT: 10 U/L (ref 6–29)
AST: 17 U/L (ref 10–35)
Albumin: 4.1 g/dL (ref 3.6–5.1)
Alkaline Phosphatase: 78 U/L (ref 33–130)
BUN: 19 mg/dL (ref 7–25)
CO2: 27 mmol/L (ref 20–31)
Calcium: 9.8 mg/dL (ref 8.6–10.4)
Chloride: 104 mmol/L (ref 98–110)
Creat: 1.2 mg/dL — ABNORMAL HIGH (ref 0.50–1.05)
GFR, Est African American: 59 mL/min — ABNORMAL LOW (ref >=60)
GFR, Est Non African American: 51 mL/min — ABNORMAL LOW (ref >=60)
Glucose, Bld: 111 mg/dL — ABNORMAL HIGH (ref 65–99)
Potassium: 5.1 mmol/L (ref 3.5–5.3)
Sodium: 139 mmol/L (ref 135–146)
Total Bilirubin: 0.7 mg/dL (ref 0.2–1.2)
Total Protein: 6.9 g/dL (ref 6.1–8.1)

## 2015-10-06 LAB — URINE CYTOLOGY ANCILLARY ONLY
Chlamydia: NEGATIVE
Neisseria Gonorrhea: NEGATIVE

## 2015-10-06 LAB — T-HELPER CELL (CD4) - (RCID CLINIC ONLY)
CD4 % Helper T Cell: 36 % (ref 33–55)
CD4 T Cell Abs: 750 /uL (ref 400–2700)

## 2015-10-07 LAB — HIV-1 RNA QUANT-NO REFLEX-BLD
HIV 1 RNA Quant: <20 copies/mL (ref <20)
HIV-1 RNA Quant, Log: <1.3 Log copies/mL (ref <1.30)

## 2015-10-20 ENCOUNTER — Ambulatory Visit: Payer: Medicaid Other | Admitting: *Deleted

## 2015-10-26 ENCOUNTER — Ambulatory Visit (INDEPENDENT_AMBULATORY_CARE_PROVIDER_SITE_OTHER): Payer: Medicaid Other | Admitting: Internal Medicine

## 2015-10-26 ENCOUNTER — Encounter: Payer: Self-pay | Admitting: Internal Medicine

## 2015-10-26 VITALS — BP 116/76 | HR 80 | Temp 97.3°F | Wt 94.0 lb

## 2015-10-26 DIAGNOSIS — B20 Human immunodeficiency virus [HIV] disease: Secondary | ICD-10-CM | POA: Diagnosis not present

## 2015-10-26 DIAGNOSIS — Z23 Encounter for immunization: Secondary | ICD-10-CM | POA: Diagnosis not present

## 2015-10-26 DIAGNOSIS — E785 Hyperlipidemia, unspecified: Secondary | ICD-10-CM | POA: Diagnosis not present

## 2015-10-26 DIAGNOSIS — R634 Abnormal weight loss: Secondary | ICD-10-CM | POA: Diagnosis not present

## 2015-10-26 MED ORDER — ELVITEG-COBIC-EMTRICIT-TENOFAF 150-150-200-10 MG PO TABS: 1.0000 | ORAL_TABLET | Freq: Every day | ORAL | Status: DC

## 2015-10-26 MED ORDER — PRAVASTATIN SODIUM 20 MG PO TABS: 20.0000 mg | ORAL_TABLET | Freq: Every day | ORAL | Status: DC

## 2015-10-26 NOTE — Progress Notes (Signed)
Patient ID: Janice Williams, female   DOB: 1961-12-09, 54 y.o.   MRN: [id number]       Patient ID: Janice Williams, female   DOB: Apr 07, 1961, 54 y.o.   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 Doing well .went to Ed for painful skin nodules thought to be related neurofibromatosis? Though did not go to dermatology for evaluation. She has had pain to left ankle from twisting it roughly 2 months ago  Outpatient Encounter Prescriptions as of 10/26/2015  Medication Sig  . amLODipine (NORVASC) 10 MG tablet Take 1 tablet (10 mg total) by mouth daily.  [name] atorvastatin (LIPITOR) 20 MG tablet Take 1 tablet (20 mg total) by mouth daily.  . clindamycin (CLEOCIN) 300 MG capsule Take 1 capsule (300 mg total) by mouth 3 (three) times daily.  [name] elvitegravir-cobicistat-emtricitabine-tenofovir (STRIBILD) 150-150-200-300 MG TABS tablet Take 1 tablet by mouth daily with breakfast.  . ENSURE (ENSURE) Take 237 mLs by mouth 3 (three) times daily between meals.  [name] omeprazole (PRILOSEC) 20 MG capsule Take 1 capsule (20 mg total) by mouth daily.  [name] oxyCODONE-acetaminophen (PERCOCET/ROXICET) 5-325 MG per tablet Take 1 tablet by mouth every 6 (six) hours as needed for severe pain.  . polyethylene glycol (MIRALAX / GLYCOLAX) packet Take one capful in 8 oz of fluid of your choice twice a day until having soft stools, then back down to once a day.  If not having soft stools with twice a day, increased to three times a day  . pramoxine (PROCTOFOAM) 1 % foam Place 1 application rectally 3 (three) times daily as needed for itching.  . traMADol (ULTRAM) 50 MG tablet Take 1 tablet (50 mg total) by mouth 2 (two) times daily.  Janice Williams (TUCKS) 50 % PADS Use as needed for rectal itching, after having BM  . [DISCONTINUED] dronabinol (MARINOL) 10 MG capsule Take 1 capsule (10 mg total) by mouth 2 (two) times daily before a meal.  . [DISCONTINUED] methocarbamol (ROBAXIN) 500 MG tablet Take 1 tablet (500 mg total)  by mouth every 8 (eight) hours as needed for muscle spasms. (Patient not taking: Reported on 10/26/2015)   No facility-administered encounter medications on file as of 10/26/2015.     Patient Active Problem List   Diagnosis Date Noted  . Dental abscess 03/15/2015  . Knee pain, bilateral 03/15/2015  . Acne cystica 12/22/2011  . Dyslipidemia 07/06/2011  . Routine health maintenance 07/06/2011  . EPIDERMOID CYST 02/10/2011  . ACUTE SINUSITIS, UNSPECIFIED 01/20/2010  . EXTERNAL OTITIS 01/06/2010  . HYPERLIPIDEMIA 06/03/2009  . WEIGHT LOSS 06/03/2009  . CARBUNCLE AND FURUNCLE OF FACE 02/04/2009  . FACIAL RASH 02/04/2009  . Human immunodeficiency virus (HIV) disease ([place]) 10/29/2008  . ANEMIA-NOS 10/29/2008  . PERIPHERAL NEUROPATHY 10/29/2008  . GERD 10/29/2008  . RENAL INSUFFICIENCY 10/29/2008  . CEREBROVASCULAR ACCIDENT, HX OF 10/29/2008  . TOBACCO DEPENDENCE 02/14/2007  . CVA 02/14/2007  . RHINITIS, ALLERGIC 02/14/2007  . DYSPEPSIA 02/14/2007  . ACNE 02/14/2007  . WEIGHT LOSS, ABNORMAL 02/14/2007     Health Maintenance Due  Topic Date Due  . COLONOSCOPY  08/28/2010  . PAP SMEAR  04/04/2015  . INFLUENZA VACCINE  07/19/2015     Review of Systems + ankle pain.still having difficulty gaining weight. Otherwise 10 point ros is negative Physical Exam   BP 116/76 mmHg  Pulse 80  Temp(Src) 97.3 F (36.3 C) (Oral)  Wt 94 lb (42.638 kg) Physical Exam  Constitutional:  oriented to person, place, and time. appears well-developed and well-nourished. No distress.  HENT: [place]/AT, PERRLA, no scleral icterus Mouth/Throat: Oropharynx is clear and moist. No oropharyngeal exudate.  Cardiovascular: Normal rate, regular rhythm and normal heart sounds. Exam reveals no gallop and no friction rub.  No murmur heard.  Pulmonary/Chest: Effort normal and breath sounds normal. No respiratory distress.  has no wheezes.  Neck = supple, no nuchal rigidity Abdominal: Soft. Bowel sounds are normal.   exhibits no distension. There is no tenderness.  Lymphadenopathy: no cervical adenopathy. No axillary adenopathy Neurological: alert and oriented to person, place, and time.  Skin: numerous papules to face Psychiatric: a normal mood and affect.  behavior is normal.   Lab Results  Component Value Date   CD4TCELL 36 10/05/2015   Lab Results  Component Value Date   CD4TABS 750 10/05/2015   CD4TABS 720 04/01/2015   CD4TABS 710 12/28/2014   Lab Results  Component Value Date   HIV1RNAQUANT <20 10/05/2015   Lab Results  Component Value Date   HEPBSAB NEG 12/28/2014   No results found for: RPR  CBC Lab Results  Component Value Date   WBC 4.2 10/05/2015   RBC 3.54* 10/05/2015   HGB 11.9* 10/05/2015   HCT 35.1* 10/05/2015   PLT 323 10/05/2015   MCV 99.2 10/05/2015   MCH 33.6 10/05/2015   MCHC 33.9 10/05/2015   RDW 13.4 10/05/2015   LYMPHSABS 2.1 10/05/2015   MONOABS 0.3 10/05/2015   EOSABS 0.0 10/05/2015   BASOSABS 0.0 10/05/2015   BMET Lab Results  Component Value Date   NA 139 10/05/2015   K 5.1 10/05/2015   CL 104 10/05/2015   CO2 27 10/05/2015   GLUCOSE 111* 10/05/2015   BUN 19 10/05/2015   CREATININE 1.20* 10/05/2015   CALCIUM 9.8 10/05/2015   GFRNONAA 51* 10/05/2015   GFRAA 59* 10/05/2015     Assessment and Plan  hiv disease = will switch to genvoya  Underweight = will have her use protein shakes  ckd = continue monitor, presently stable  Health maintenance = will have her get flu shot  hld = will do pravastatin 20mg  qday  Left ankle pain = continue with ankle wrap, encouraged her that sprains take some time to recover

## 2015-10-28 ENCOUNTER — Ambulatory Visit: Payer: Medicaid Other | Admitting: *Deleted

## 2016-01-13 ENCOUNTER — Other Ambulatory Visit: Payer: Self-pay | Admitting: *Deleted

## 2016-01-13 DIAGNOSIS — K2100 Gastro-esophageal reflux disease with esophagitis, without bleeding: Secondary | ICD-10-CM

## 2016-01-13 MED ORDER — OMEPRAZOLE 20 MG PO CPDR
20.0000 mg | DELAYED_RELEASE_CAPSULE | Freq: Every day | ORAL | Status: DC
Start: 2016-01-13 — End: 2016-08-08

## 2016-01-25 ENCOUNTER — Other Ambulatory Visit: Payer: Self-pay | Admitting: Internal Medicine

## 2016-01-25 ENCOUNTER — Other Ambulatory Visit: Payer: Medicaid Other

## 2016-01-25 DIAGNOSIS — Z79899 Other long term (current) drug therapy: Secondary | ICD-10-CM

## 2016-01-25 DIAGNOSIS — Z113 Encounter for screening for infections with a predominantly sexual mode of transmission: Secondary | ICD-10-CM

## 2016-01-25 DIAGNOSIS — Z21 Asymptomatic human immunodeficiency virus [HIV] infection status: Secondary | ICD-10-CM

## 2016-01-25 LAB — CBC WITH DIFFERENTIAL/PLATELET
Basophils Absolute: 0 10*3/uL (ref 0.0–0.1)
Basophils Relative: 1 % (ref 0–1)
Eosinophils Absolute: 0 10*3/uL (ref 0.0–0.7)
Eosinophils Relative: 1 % (ref 0–5)
HCT: 35.9 % — ABNORMAL LOW (ref 36.0–46.0)
Hemoglobin: 12.2 g/dL (ref 12.0–15.0)
Lymphocytes Relative: 56 % — ABNORMAL HIGH (ref 12–46)
Lymphs Abs: 2.2 10*3/uL (ref 0.7–4.0)
MCH: 33.2 pg (ref 26.0–34.0)
MCHC: 34 g/dL (ref 30.0–36.0)
MCV: 97.6 fL (ref 78.0–100.0)
MPV: 9.4 fL (ref 8.6–12.4)
Monocytes Absolute: 0.2 10*3/uL (ref 0.1–1.0)
Monocytes Relative: 6 % (ref 3–12)
Neutro Abs: 1.4 10*3/uL — ABNORMAL LOW (ref 1.7–7.7)
Neutrophils Relative %: 36 % — ABNORMAL LOW (ref 43–77)
Platelets: 277 10*3/uL (ref 150–400)
RBC: 3.68 MIL/uL — ABNORMAL LOW (ref 3.87–5.11)
RDW: 13.8 % (ref 11.5–15.5)
WBC: 3.9 10*3/uL — ABNORMAL LOW (ref 4.0–10.5)

## 2016-01-25 LAB — COMPREHENSIVE METABOLIC PANEL
ALT: 8 U/L (ref 6–29)
AST: 19 U/L (ref 10–35)
Albumin: 4.4 g/dL (ref 3.6–5.1)
Alkaline Phosphatase: 61 U/L (ref 33–130)
BUN: 21 mg/dL (ref 7–25)
CO2: 26 mmol/L (ref 20–31)
Calcium: 9.3 mg/dL (ref 8.6–10.4)
Chloride: 103 mmol/L (ref 98–110)
Creat: 1.12 mg/dL — ABNORMAL HIGH (ref 0.50–1.05)
Glucose, Bld: 91 mg/dL (ref 65–99)
Potassium: 4.2 mmol/L (ref 3.5–5.3)
Sodium: 137 mmol/L (ref 135–146)
Total Bilirubin: 0.6 mg/dL (ref 0.2–1.2)
Total Protein: 7.5 g/dL (ref 6.1–8.1)

## 2016-01-25 LAB — LIPID PANEL
Cholesterol: 252 mg/dL — ABNORMAL HIGH (ref 125–200)
HDL: 136 mg/dL (ref >=46)
LDL Cholesterol: 103 mg/dL (ref <130)
Total CHOL/HDL Ratio: 1.9 Ratio (ref <=5.0)
Triglycerides: 63 mg/dL (ref <150)
VLDL: 13 mg/dL (ref <30)

## 2016-01-26 LAB — RPR

## 2016-01-26 LAB — T-HELPER CELL (CD4) - (RCID CLINIC ONLY)
CD4 % Helper T Cell: 37 % (ref 33–55)
CD4 T Cell Abs: 820 /uL (ref 400–2700)

## 2016-01-27 LAB — HIV-1 RNA QUANT-NO REFLEX-BLD
HIV 1 RNA Quant: 31 copies/mL — ABNORMAL HIGH (ref <20)
HIV-1 RNA Quant, Log: 1.49 Log copies/mL — ABNORMAL HIGH (ref <1.30)

## 2016-02-08 ENCOUNTER — Ambulatory Visit: Payer: Medicaid Other | Admitting: Internal Medicine

## 2016-02-09 ENCOUNTER — Ambulatory Visit (INDEPENDENT_AMBULATORY_CARE_PROVIDER_SITE_OTHER): Payer: Medicaid Other | Admitting: Internal Medicine

## 2016-02-09 ENCOUNTER — Ambulatory Visit: Payer: Medicaid Other | Admitting: *Deleted

## 2016-02-09 ENCOUNTER — Telehealth: Payer: Self-pay

## 2016-02-09 ENCOUNTER — Encounter: Payer: Self-pay | Admitting: Internal Medicine

## 2016-02-09 VITALS — BP 149/88 | HR 65 | Temp 98.1°F | Ht 64.0 in | Wt 91.0 lb

## 2016-02-09 DIAGNOSIS — T148XXA Other injury of unspecified body region, initial encounter: Secondary | ICD-10-CM

## 2016-02-09 DIAGNOSIS — R634 Abnormal weight loss: Secondary | ICD-10-CM | POA: Diagnosis not present

## 2016-02-09 DIAGNOSIS — F411 Generalized anxiety disorder: Secondary | ICD-10-CM

## 2016-02-09 DIAGNOSIS — T148 Other injury of unspecified body region: Secondary | ICD-10-CM | POA: Diagnosis present

## 2016-02-09 MED ORDER — METHOCARBAMOL 500 MG PO TABS: 500.0000 mg | ORAL_TABLET | Freq: Three times a day (TID) | ORAL | Status: DC | PRN

## 2016-02-09 MED ORDER — ENSURE PO LIQD: 237.0000 mL | Freq: Three times a day (TID) | ORAL | Status: DC

## 2016-02-09 NOTE — Progress Notes (Signed)
Patient ID: Janice Williams, female   DOB: [birth year], 55 y.o.   MRN: [id number]       Patient ID: Janice Williams, female   DOB: [birth year], 55 y.o.   MRN: [id number]  HPI 55yo F with hiv disease,cd 4 count of 820/VL 31, currently on genvoya. Has had several deaths in the family, feeling very overwhelmed. Poor sleep x 2 wk. She confides in her family for support  Outpatient Encounter Prescriptions as of 02/09/2016  Medication Sig  . elvitegravir-cobicistat-emtricitabine-tenofovir (GENVOYA) 150-150-200-10 MG TABS tablet Take 1 tablet by mouth daily with breakfast.  . amLODipine (NORVASC) 10 MG tablet Take 1 tablet (10 mg total) by mouth daily.  [name] ENSURE (ENSURE) Take 237 mLs by mouth 3 (three) times daily between meals.  [name] omeprazole (PRILOSEC) 20 MG capsule Take 1 capsule (20 mg total) by mouth daily.  [name] oxyCODONE-acetaminophen (PERCOCET/ROXICET) 5-325 MG per tablet Take 1 tablet by mouth every 6 (six) hours as needed for severe pain.  . polyethylene glycol (MIRALAX / GLYCOLAX) packet Take one capful in 8 oz of fluid of your choice twice a day until having soft stools, then back down to once a day.  If not having soft stools with twice a day, increased to three times a day  . pramoxine (PROCTOFOAM) 1 % foam Place 1 application rectally 3 (three) times daily as needed for itching.  . pravastatin (PRAVACHOL) 20 MG tablet Take 1 tablet (20 mg total) by mouth daily.  . traMADol (ULTRAM) 50 MG tablet Take 1 tablet (50 mg total) by mouth 2 (two) times daily.  [name] (TUCKS) 50 % PADS Use as needed for rectal itching, after having BM   No facility-administered encounter medications on file as of 02/09/2016.     Patient Active Problem List   Diagnosis Date Noted  . Dental abscess 03/15/2015  . Knee pain, bilateral 03/15/2015  . Acne cystica 12/22/2011  . Dyslipidemia 07/06/2011  . Routine health maintenance 07/06/2011  . EPIDERMOID CYST 02/10/2011  . ACUTE SINUSITIS, UNSPECIFIED 01/20/2010    . EXTERNAL OTITIS 01/06/2010  . HYPERLIPIDEMIA 06/03/2009  . WEIGHT LOSS 06/03/2009  . CARBUNCLE AND FURUNCLE OF FACE 02/04/2009  . FACIAL RASH 02/04/2009  . Human immunodeficiency virus (HIV) disease ([place]) 10/29/2008  . ANEMIA-NOS 10/29/2008  . PERIPHERAL NEUROPATHY 10/29/2008  . GERD 10/29/2008  . RENAL INSUFFICIENCY 10/29/2008  . CEREBROVASCULAR ACCIDENT, HX OF 10/29/2008  . TOBACCO DEPENDENCE 02/14/2007  . CVA 02/14/2007  . RHINITIS, ALLERGIC 02/14/2007  . DYSPEPSIA 02/14/2007  . ACNE 02/14/2007  . WEIGHT LOSS, ABNORMAL 02/14/2007     Health Maintenance Due  Topic Date Due  . COLONOSCOPY  08/28/2010  . PAP SMEAR  04/04/2015     Review of Systems +grieving loss in the family. Insomnia. Loss of appetite. 10 point ros otherwise is negative Physical Exam   BP 149/88 mmHg  Pulse 65  Temp(Src) 98.1 F (36.7 C) (Oral)  Ht 5\' 4"  (1.626 m)  Wt 91 lb (41.277 kg)  BMI 15.61 kg/m2 Physical Exam  Constitutional:  oriented to person, place, and time. appears well-developed and well-nourished. No distress.  HENT: [place]/AT, PERRLA, no scleral icterus Mouth/Throat: Oropharynx is clear and moist. No oropharyngeal exudate.  Cardiovascular: Normal rate, regular rhythm and normal heart sounds. Exam reveals no gallop and no friction rub.  No murmur heard.  Pulmonary/Chest: Effort normal and breath sounds normal. No respiratory distress.  has no wheezes.  Neck = supple, no nuchal rigidity Abdominal: Soft. Bowel sounds  are normal.  exhibits no distension. There is no tenderness.  Lymphadenopathy: no cervical adenopathy. No axillary adenopathy Neurological: alert and oriented to person, place, and time.  Skin: Skin is warm and dry. No rash noted. No erythema.  Psychiatric: mood initially flat, occasionally crying during visit.   Lab Results  Component Value Date   CD4TCELL 37 01/25/2016   Lab Results  Component Value Date   CD4TABS 820 01/25/2016   CD4TABS 750 10/05/2015    CD4TABS 720 04/01/2015   Lab Results  Component Value Date   HIV1RNAQUANT 31* 01/25/2016   Lab Results  Component Value Date   HEPBSAB NEG 12/28/2014   No results found for: RPR  CBC Lab Results  Component Value Date   WBC 3.9* 01/25/2016   RBC 3.68* 01/25/2016   HGB 12.2 01/25/2016   HCT 35.9* 01/25/2016   PLT 277 01/25/2016   MCV 97.6 01/25/2016   MCH 33.2 01/25/2016   MCHC 34.0 01/25/2016   RDW 13.8 01/25/2016   LYMPHSABS 2.2 01/25/2016   MONOABS 0.2 01/25/2016   EOSABS 0.0 01/25/2016   BASOSABS 0.0 01/25/2016   BMET Lab Results  Component Value Date   NA 137 01/25/2016   K 4.2 01/25/2016   CL 103 01/25/2016   CO2 26 01/25/2016   GLUCOSE 91 01/25/2016   BUN 21 01/25/2016   CREATININE 1.12* 01/25/2016   CALCIUM 9.3 01/25/2016   GFRNONAA 51* 10/05/2015   GFRAA 59* 10/05/2015     Assessment and Plan   hiv disease= well controlled. Continue on current regimen  Acute greivance = appears distraught and offered counseling avialable through clinic. Recommended ot continue to confide in family/friends for support.  Insomnia = due to stress/loss in family. Can do trial of ambien if it still persists in the next 1-2 wk  Muscle spasm = will do robaxin  Weight loss = will give rx for ensure

## 2016-02-09 NOTE — Telephone Encounter (Signed)
Patients states medication given at office visit is the same medication that gives her nausea. She was told to call back and inform of medicaton name:  Robaxin.   Please call medication to pharmacy.  [facility] on [address].

## 2016-02-09 NOTE — BH Specialist Note (Signed)
Counselor met with Janice Williams in the exam room per Dr. Baxter Flattery request due to showing signs of depression as demonstrated by crying and indicating that she is sad and emotionally drained.  Patient communicated that life had been too much lately due to a couple of family members passing as well as finding out her best friend had succumbed to AIDS.  Patient shared that she did not realize how sick her friend was as the friend was insolating herself over the last couple of months. Counselor shared the counseling services that are available to her and encouraged her to meet together to process all that was going on in her life.  Patient agreed and received a business card.  Counselor provided support and encouragement accordingly.   Rolena Infante, MA [facility]

## 2016-02-10 NOTE — Telephone Encounter (Signed)
Patient stopped this RN in the hallway while still in clinic.  Per Dr. Baxter Flattery, this is the only medication she's willing to write.  Patient notified while in clinic.

## 2016-03-10 ENCOUNTER — Telehealth: Payer: Self-pay | Admitting: *Deleted

## 2016-03-10 NOTE — Telephone Encounter (Signed)
Patient called and advised she recently had a head cold and now she feels as if she has an ear infection. She advised her ear aches, no fever, no drainage. Advised her since she has recently had a cold and no fever she may want to try some sudafed to see if she can get some relief it may just be sinus inflammation and she could also use a warm compress. Advised her to try it and give [place] a call back on Monday to see if she is still having trouble.

## 2016-03-27 ENCOUNTER — Telehealth: Payer: Self-pay | Admitting: *Deleted

## 2016-03-27 NOTE — Telephone Encounter (Signed)
Patient called, asking to speak with a nurse, wanted an appointment tomorrow. She states she has had itching "between my legs" for a few days and over-the-counter creams are ineffective.  She has medicaid, does not have a primary care physician. RN advised patient to try urgent care, as the location at [facility] [address] also offers primary care. Landis Gandy, RN

## 2016-03-28 ENCOUNTER — Encounter (HOSPITAL_COMMUNITY): Payer: Self-pay | Admitting: *Deleted

## 2016-03-28 ENCOUNTER — Emergency Department (HOSPITAL_COMMUNITY)
Admission: EM | Admit: 2016-03-28 | Discharge: 2016-03-28 | Disposition: A | Discharge status: Home / Self Care | Payer: Medicaid Other | Attending: Emergency Medicine | Admitting: Emergency Medicine

## 2016-03-28 DIAGNOSIS — Z792 Long term (current) use of antibiotics: Secondary | ICD-10-CM | POA: Insufficient documentation

## 2016-03-28 DIAGNOSIS — Z7952 Long term (current) use of systemic steroids: Secondary | ICD-10-CM | POA: Diagnosis not present

## 2016-03-28 DIAGNOSIS — Z87891 Personal history of nicotine dependence: Secondary | ICD-10-CM | POA: Insufficient documentation

## 2016-03-28 DIAGNOSIS — N898 Other specified noninflammatory disorders of vagina: Secondary | ICD-10-CM

## 2016-03-28 DIAGNOSIS — Z21 Asymptomatic human immunodeficiency virus [HIV] infection status: Secondary | ICD-10-CM | POA: Diagnosis not present

## 2016-03-28 DIAGNOSIS — A5901 Trichomonal vulvovaginitis: Secondary | ICD-10-CM | POA: Diagnosis not present

## 2016-03-28 DIAGNOSIS — I10 Essential (primary) hypertension: Secondary | ICD-10-CM | POA: Diagnosis not present

## 2016-03-28 DIAGNOSIS — Z880 Allergy status to penicillin: Secondary | ICD-10-CM | POA: Diagnosis not present

## 2016-03-28 DIAGNOSIS — Z79899 Other long term (current) drug therapy: Secondary | ICD-10-CM | POA: Insufficient documentation

## 2016-03-28 LAB — URINALYSIS, ROUTINE W REFLEX MICROSCOPIC
Bilirubin Urine: NEGATIVE
Glucose, UA: NEGATIVE mg/dL
Hgb urine dipstick: NEGATIVE
Ketones, ur: NEGATIVE mg/dL
Nitrite: NEGATIVE
Protein, ur: NEGATIVE mg/dL
Specific Gravity, Urine: 1.021 (ref 1.005–1.030)
pH: 6.5 (ref 5.0–8.0)

## 2016-03-28 LAB — URINE MICROSCOPIC-ADD ON

## 2016-03-28 LAB — WET PREP, GENITAL
Clue Cells Wet Prep HPF POC: NONE SEEN
Sperm: NONE SEEN
Yeast Wet Prep HPF POC: NONE SEEN

## 2016-03-28 MED ORDER — METRONIDAZOLE 500 MG PO TABS: 500.0000 mg | ORAL_TABLET | Freq: Two times a day (BID) | ORAL | Status: DC

## 2016-03-28 MED ORDER — HYDROCORTISONE 1 % EX CREA: TOPICAL_CREAM | CUTANEOUS | Status: DC

## 2016-03-28 NOTE — Discharge Instructions (Signed)
Medications: Metronidazole, Hydrocortisone cream   Treatment: Take metronidazole as prescribed for 1 week. Do not drink alcohol when taking this medicine. I recommend you begin using Dove soap for sensitive skin. Use hydrocortisone on the OUTSIDE of your vagina 3-4 times per day. Do not use it on the inside.  Follow-up: Please follow-up with your doctor or return to the emergency department if your symptoms continue or do not improve after completion of the antibiotic. Please return to emergency department if you develop any new or concerning symptoms.   Trichomoniasis Trichomoniasis is an infection caused by an organism called Trichomonas. The infection can affect both women and men. In women, the outer female genitalia and the vagina are affected. In men, the penis is mainly affected, but the prostate and other reproductive organs can also be involved. Trichomoniasis is a sexually transmitted infection (STI) and is most often passed to another person through sexual contact.  RISK FACTORS  Having unprotected sexual intercourse.  Having sexual intercourse with an infected partner. SIGNS AND SYMPTOMS  Symptoms of trichomoniasis in women include:  Abnormal gray-green frothy vaginal discharge.  Itching and irritation of the vagina.  Itching and irritation of the area outside the vagina. Symptoms of trichomoniasis in men include:   Penile discharge with or without pain.  Pain during urination. This results from inflammation of the urethra. DIAGNOSIS  Trichomoniasis may be found during a Pap test or physical exam. Your health care provider may use one of the following methods to help diagnose this infection:  Testing the pH of the vagina with a test tape.  Using a vaginal swab test that checks for the Trichomonas organism. A test is available that provides results within a few minutes.  Examining a urine sample.  Testing vaginal secretions. Your health care provider may test you for  other STIs, including HIV. TREATMENT   You may be given medicine to fight the infection. Women should inform their health care provider if they could be or are pregnant. Some medicines used to treat the infection should not be taken during pregnancy.  Your health care provider may recommend over-the-counter medicines or creams to decrease itching or irritation.  Your sexual partner will need to be treated if infected.  Your health care provider may test you for infection again 3 months after treatment. HOME CARE INSTRUCTIONS   Take medicines only as directed by your health care provider.  Take over-the-counter medicine for itching or irritation as directed by your health care provider.  Do not have sexual intercourse while you have the infection.  Women should not douche or wear tampons while they have the infection.  Discuss your infection with your partner. Your partner may have gotten the infection from you, or you may have gotten it from your partner.  Have your sex partner get examined and treated if necessary.  Practice safe, informed, and protected sex.  See your health care provider for other STI testing. SEEK MEDICAL CARE IF:   You still have symptoms after you finish your medicine.  You develop abdominal pain.  You have pain when you urinate.  You have bleeding after sexual intercourse.  You develop a rash.  Your medicine makes you sick or makes you throw up (vomit). MAKE SURE YOU:  Understand these instructions.  Will watch your condition.  Will get help right away if you are not doing well or get worse.   This information is not intended to replace advice given to you by your health care  provider. Make sure you discuss any questions you have with your health care provider.   Document Released: 05/30/2001 Document Revised: 12/25/2014 Document Reviewed: 09/15/2013 Elsevier Interactive Patient Education 2016 [employer].  Vaginitis Vaginitis is an  inflammation of the vagina. It is most often caused by a change in the normal balance of the bacteria and yeast that live in the vagina. This change in balance causes an overgrowth of certain bacteria or yeast, which causes the inflammation. There are different types of vaginitis, but the most common types are:  Bacterial vaginosis.  Yeast infection (candidiasis).  Trichomoniasis vaginitis. This is a sexually transmitted infection (STI).  Viral vaginitis.  Atrophic vaginitis.  Allergic vaginitis. CAUSES  The cause depends on the type of vaginitis. Vaginitis can be caused by:  Bacteria (bacterial vaginosis).  Yeast (yeast infection).  A parasite (trichomoniasis vaginitis)  A virus (viral vaginitis).  Low hormone levels (atrophic vaginitis). Low hormone levels can occur during pregnancy, breastfeeding, or after menopause.  Irritants, such as bubble baths, scented tampons, and feminine sprays (allergic vaginitis). Other factors can change the normal balance of the yeast and bacteria that live in the vagina. These include:  Antibiotic medicines.  Poor hygiene.  Diaphragms, vaginal sponges, spermicides, birth control pills, and intrauterine devices (IUD).  Sexual intercourse.  Infection.  Uncontrolled diabetes.  A weakened immune system. SYMPTOMS  Symptoms can vary depending on the cause of the vaginitis. Common symptoms include:  Abnormal vaginal discharge.  The discharge is white, gray, or yellow with bacterial vaginosis.  The discharge is thick, white, and cheesy with a yeast infection.  The discharge is frothy and yellow or greenish with trichomoniasis.  A bad vaginal odor.  The odor is fishy with bacterial vaginosis.  Vaginal itching, pain, or swelling.  Painful intercourse.  Pain or burning when urinating. Sometimes, there are no symptoms. TREATMENT  Treatment will vary depending on the type of infection.   Bacterial vaginosis and trichomoniasis are  often treated with antibiotic creams or pills.  Yeast infections are often treated with antifungal medicines, such as vaginal creams or suppositories.  Viral vaginitis has no cure, but symptoms can be treated with medicines that relieve discomfort. Your sexual partner should be treated as well.  Atrophic vaginitis may be treated with an estrogen cream, pill, suppository, or vaginal ring. If vaginal dryness occurs, lubricants and moisturizing creams may help. You may be told to avoid scented soaps, sprays, or douches.  Allergic vaginitis treatment involves quitting the use of the product that is causing the problem. Vaginal creams can be used to treat the symptoms. HOME CARE INSTRUCTIONS   Take all medicines as directed by your caregiver.  Keep your genital area clean and dry. Avoid soap and only rinse the area with water.  Avoid douching. It can remove the healthy bacteria in the vagina.  Do not use tampons or have sexual intercourse until your vaginitis has been treated. Use sanitary pads while you have vaginitis.  Wipe from front to back. This avoids the spread of bacteria from the rectum to the vagina.  Let air reach your genital area.  Wear cotton underwear to decrease moisture buildup.  Avoid wearing underwear while you sleep until your vaginitis is gone.  Avoid tight pants and underwear or nylons without a cotton panel.  Take off wet clothing (especially bathing suits) as soon as possible.  Use mild, non-scented products. Avoid using irritants, such as:  Scented feminine sprays.  Fabric softeners.  Scented detergents.  Scented tampons.  Scented soaps or bubble baths.  Practice safe sex and use condoms. Condoms may prevent the spread of trichomoniasis and viral vaginitis. SEEK MEDICAL CARE IF:   You have abdominal pain.  You have a fever or persistent symptoms for more than 2-3 days.  You have a fever and your symptoms suddenly get worse.   This information is  not intended to replace advice given to you by your health care provider. Make sure you discuss any questions you have with your health care provider.   Document Released: 10/01/2007 Document Revised: 04/20/2015 Document Reviewed: 05/16/2012 Elsevier Interactive Patient Education [employer].

## 2016-03-28 NOTE — ED Notes (Signed)
Pt c/o vaginal itching onset x 3 days, pt denies vaginal discharge, pt denies dysuria, pt A&O x4

## 2016-03-28 NOTE — ED Provider Notes (Signed)
CSN: [id number]     Arrival date & time 03/28/16  1034 History   First MD Initiated Contact with Patient 03/28/16 1501     Chief Complaint  Patient presents with  . Vaginal Itching     (Consider location/radiation/quality/duration/timing/severity/associated sxs/prior Treatment) HPI Comments: Patient is a 55 year old female with history of HIV who presents today for vaginal itching. Patient reports that her itching began 2-3 days ago. She denies any abnormal discharge or bleeding. Patient is not sexually active. Patient is not having any pelvic pain. Patient has tried Monistat and an over-the-counter feminine itch cream without relief. Patient reports she did switch to using [employer] last week. The itching began after this switch. Patient also uses a lot of bleach in her white clothes but this is normal and not a new practice. Patient denies chest pain, short of breath, abdominal pain, nausea, vomiting, dysuria, headaches.  Patient is a 55 y.o. female presenting with vaginal itching. The history is provided by the patient.  Vaginal Itching Pertinent negatives include no abdominal pain, chest pain, chills, fever, headaches, nausea, rash, sore throat or vomiting.    Past Medical History  Diagnosis Date  . HIV infection ([place])   . Substance abuse     history, clean 7 years  . Hypertension    History reviewed. No pertinent past surgical history. No family history on file. Social History  Substance Use Topics  . Smoking status: Former Smoker -- 0.40 packs/day    Types: Cigarettes    Start date: 12/18/2012  . Smokeless tobacco: Never Used  . Alcohol Use: 0.0 oz/week    0 Standard drinks or equivalent per week     Comment: "occasional"    OB History    No data available     Review of Systems  Constitutional: Negative for fever and chills.  HENT: Negative for facial swelling and sore throat.   Respiratory: Negative for shortness of breath.   Cardiovascular: Negative for  chest pain.  Gastrointestinal: Negative for nausea, vomiting and abdominal pain.  Genitourinary: Negative for dysuria, vaginal bleeding, vaginal discharge and vaginal pain.  Musculoskeletal: Negative for back pain.  Skin: Negative for rash and wound.  Neurological: Negative for headaches.  Psychiatric/Behavioral: The patient is not nervous/anxious.       Allergies  Penicillins and Chantix  Home Medications   Prior to Admission medications   Medication Sig Start Date End Date Taking? Authorizing Provider  amLODipine (NORVASC) 10 MG tablet Take 1 tablet (10 mg total) by mouth daily. 12/31/14  Yes Carlyle Basques, MD  elvitegravir-cobicistat-emtricitabine-tenofovir (GENVOYA) 150-150-200-10 MG TABS tablet Take 1 tablet by mouth daily with breakfast. 10/26/15  Yes Carlyle Basques, MD  ENSURE (ENSURE) Take 237 mLs by mouth 3 (three) times daily between meals. 02/09/16  Yes Carlyle Basques, MD  methocarbamol (ROBAXIN) 500 MG tablet Take 1 tablet (500 mg total) by mouth every 8 (eight) hours as needed for muscle spasms. 02/09/16  Yes Carlyle Basques, MD  omeprazole (PRILOSEC) 20 MG capsule Take 1 capsule (20 mg total) by mouth daily. 01/13/16  Yes Carlyle Basques, MD  polyethylene glycol [facility] / GLYCOLAX) packet Take one capful in 8 oz of fluid of your choice twice a day until having soft stools, then back down to once a day.  If not having soft stools with twice a day, increased to three times a day 07/21/15  Yes Linton Flemings, MD  pravastatin (PRAVACHOL) 20 MG tablet Take 1 tablet (20 mg total) by mouth daily. 10/26/15  Yes Carlyle Basques, MD  traMADol (ULTRAM) 50 MG tablet Take 1 tablet (50 mg total) by mouth 2 (two) times daily. 03/15/15  Yes Truman Hayward, MD  hydrocortisone cream 1 % Apply to affected area 3-4 times daily 03/28/16   Bea Graff Yaasir Menken, PA-C  metroNIDAZOLE (FLAGYL) 500 MG tablet Take 1 tablet (500 mg total) by mouth 2 (two) times daily. 03/28/16   Frederica Kuster, PA-C   oxyCODONE-acetaminophen (PERCOCET/ROXICET) 5-325 MG per tablet Take 1 tablet by mouth every 6 (six) hours as needed for severe pain. 04/29/15   Montine Circle, PA-C  pramoxine (PROCTOFOAM) 1 % foam Place 1 application rectally 3 (three) times daily as needed for itching. 07/21/15   Linton Flemings, MD  Witch Hazel (TUCKS) 50 % PADS Use as needed for rectal itching, after having BM 07/21/15   Linton Flemings, MD   BP 134/84 mmHg  Pulse 74  Temp(Src) 98 F (36.7 C) (Oral)  Resp 18  Ht 5\' 3"  (1.6 m)  Wt 45.36 kg  BMI 17.72 kg/m2  SpO2 100% Physical Exam  Constitutional: She appears well-developed and well-nourished. No distress.  HENT:  Head: Normocephalic and atraumatic.  Mouth/Throat: Oropharynx is clear and moist. No oropharyngeal exudate.  Eyes: Conjunctivae are normal. Pupils are equal, round, and reactive to light. Right eye exhibits no discharge. Left eye exhibits no discharge. No scleral icterus.  Neck: Normal range of motion. Neck supple. No thyromegaly present.  Cardiovascular: Normal rate, regular rhythm, normal heart sounds and intact distal pulses.  Exam reveals no gallop and no friction rub.   No murmur heard. Pulmonary/Chest: Effort normal and breath sounds normal. No stridor. No respiratory distress. She has no wheezes. She has no rales.  Abdominal: Soft. Bowel sounds are normal. She exhibits no distension. There is no tenderness. There is no rebound and no guarding.  Genitourinary: Cervix exhibits no motion tenderness. Right adnexum displays no mass, no tenderness and no fullness. Left adnexum displays no mass, no tenderness and no fullness.  Vaginal vault filled with monostat, was able to swab some of it out; not able to visualize cervix; felt to be retroverted on bimanual, patient stated during exam she was not she had partial hysterectomy and she was not sure if she had a cervix; mild vaginal redness, no obvious irritation externally  Musculoskeletal: She exhibits no edema.   Lymphadenopathy:    She has no cervical adenopathy.  Neurological: She is alert. Coordination normal.  Skin: Skin is warm and dry. No rash noted. She is not diaphoretic. No pallor.  Psychiatric: She has a normal mood and affect.  Nursing note and vitals reviewed.   ED Course  Procedures (including critical care time) Labs Review Labs Reviewed  WET PREP, GENITAL - Abnormal; Notable for the following:    Trich, Wet Prep PRESENT (*)    WBC, Wet Prep HPF POC MODERATE (*)    All other components within normal limits  URINALYSIS, ROUTINE W REFLEX MICROSCOPIC (NOT AT [facility]) - Abnormal; Notable for the following:    Leukocytes, UA SMALL (*)    All other components within normal limits  URINE MICROSCOPIC-ADD ON - Abnormal; Notable for the following:    Squamous Epithelial / LPF 0-5 (*)    Bacteria, UA FEW (*)    All other components within normal limits  GC/CHLAMYDIA PROBE AMP ([place]) NOT AT [facility]    Imaging Review No results found. I have personally reviewed and evaluated these images and lab results as part of  my medical decision-making.   EKG Interpretation None      MDM   HIV positive patient.Trichomonas positive. On further discussion with the patient she was sexually active movement months ago. Patient discharged with metronidazole. I also advised pt to change soaps to less irritating types, such as Dove. Also discharged with hydrocortisone 1% cream for external itching due to possible contact vaginitis. Patient advised to inform and treat all sexual partners.  Pt advised on safe sex practices and understands that they have GC/Chlamydia cultures pending and will result in 2-3 days. Pt encouraged to follow up at local health department for future STI checks. No concern for PID. Discussed return precautions. Pt appears safe for discharge. Patient agrees and is in understanding of plan.   Final diagnoses:  Trichomoniasis of vagina  Vaginal itching       Frederica Kuster, PA-C 03/28/16 [place] Yao, MD 03/29/16 1126

## 2016-03-29 LAB — GC/CHLAMYDIA PROBE AMP (~~LOC~~) NOT AT ARMC
Chlamydia: NEGATIVE
Neisseria Gonorrhea: NEGATIVE

## 2016-04-22 ENCOUNTER — Other Ambulatory Visit: Payer: Self-pay | Admitting: Internal Medicine

## 2016-04-23 ENCOUNTER — Emergency Department (HOSPITAL_COMMUNITY)
Admission: EM | Admit: 2016-04-23 | Discharge: 2016-04-23 | Disposition: A | Discharge status: Home / Self Care | Payer: Medicaid Other | Attending: Emergency Medicine | Admitting: Emergency Medicine

## 2016-04-23 ENCOUNTER — Encounter (HOSPITAL_COMMUNITY): Payer: Self-pay | Admitting: Emergency Medicine

## 2016-04-23 ENCOUNTER — Emergency Department (HOSPITAL_COMMUNITY): Payer: Medicaid Other

## 2016-04-23 DIAGNOSIS — R5383 Other fatigue: Secondary | ICD-10-CM | POA: Insufficient documentation

## 2016-04-23 DIAGNOSIS — R630 Anorexia: Secondary | ICD-10-CM | POA: Insufficient documentation

## 2016-04-23 DIAGNOSIS — J069 Acute upper respiratory infection, unspecified: Secondary | ICD-10-CM | POA: Diagnosis not present

## 2016-04-23 DIAGNOSIS — B20 Human immunodeficiency virus [HIV] disease: Secondary | ICD-10-CM | POA: Insufficient documentation

## 2016-04-23 DIAGNOSIS — Z79899 Other long term (current) drug therapy: Secondary | ICD-10-CM | POA: Insufficient documentation

## 2016-04-23 DIAGNOSIS — R110 Nausea: Secondary | ICD-10-CM | POA: Insufficient documentation

## 2016-04-23 DIAGNOSIS — I10 Essential (primary) hypertension: Secondary | ICD-10-CM | POA: Diagnosis not present

## 2016-04-23 DIAGNOSIS — Z880 Allergy status to penicillin: Secondary | ICD-10-CM | POA: Insufficient documentation

## 2016-04-23 DIAGNOSIS — R05 Cough: Secondary | ICD-10-CM | POA: Diagnosis not present

## 2016-04-23 DIAGNOSIS — Z87891 Personal history of nicotine dependence: Secondary | ICD-10-CM | POA: Insufficient documentation

## 2016-04-23 LAB — URINALYSIS, ROUTINE W REFLEX MICROSCOPIC
Bilirubin Urine: NEGATIVE
Glucose, UA: NEGATIVE mg/dL
Hgb urine dipstick: NEGATIVE
Ketones, ur: 15 mg/dL — AB
Leukocytes, UA: NEGATIVE
Nitrite: NEGATIVE
Protein, ur: NEGATIVE mg/dL
Specific Gravity, Urine: 1.015 (ref 1.005–1.030)
pH: 6 (ref 5.0–8.0)

## 2016-04-23 LAB — COMPREHENSIVE METABOLIC PANEL
ALT: 11 U/L — ABNORMAL LOW (ref 14–54)
AST: 22 U/L (ref 15–41)
Albumin: 3.7 g/dL (ref 3.5–5.0)
Alkaline Phosphatase: 61 U/L (ref 38–126)
Anion gap: 14 (ref 5–15)
BUN: 8 mg/dL (ref 6–20)
CO2: 24 mmol/L (ref 22–32)
Calcium: 9.6 mg/dL (ref 8.9–10.3)
Chloride: 100 mmol/L — ABNORMAL LOW (ref 101–111)
Creatinine, Ser: 1.05 mg/dL — ABNORMAL HIGH (ref 0.44–1.00)
GFR calc Af Amer: >60 mL/min (ref >60)
GFR calc non Af Amer: 59 mL/min — ABNORMAL LOW (ref >60)
Glucose, Bld: 101 mg/dL — ABNORMAL HIGH (ref 65–99)
Potassium: 4.1 mmol/L (ref 3.5–5.1)
Sodium: 138 mmol/L (ref 135–145)
Total Bilirubin: 0.8 mg/dL (ref 0.3–1.2)
Total Protein: 7.5 g/dL (ref 6.5–8.1)

## 2016-04-23 LAB — INFLUENZA PANEL BY PCR (TYPE A & B)
H1N1 flu by pcr: NOT DETECTED
Influenza A By PCR: NEGATIVE
Influenza B By PCR: NEGATIVE

## 2016-04-23 LAB — CBC WITH DIFFERENTIAL/PLATELET
Basophils Absolute: 0 10*3/uL (ref 0.0–0.1)
Basophils Relative: 0 %
Eosinophils Absolute: 0 10*3/uL (ref 0.0–0.7)
Eosinophils Relative: 1 %
HCT: 36.4 % (ref 36.0–46.0)
Hemoglobin: 12.3 g/dL (ref 12.0–15.0)
Lymphocytes Relative: 27 %
Lymphs Abs: 1.6 10*3/uL (ref 0.7–4.0)
MCH: 32.8 pg (ref 26.0–34.0)
MCHC: 33.8 g/dL (ref 30.0–36.0)
MCV: 97.1 fL (ref 78.0–100.0)
Monocytes Absolute: 0.5 10*3/uL (ref 0.1–1.0)
Monocytes Relative: 9 %
Neutro Abs: 3.7 10*3/uL (ref 1.7–7.7)
Neutrophils Relative %: 63 %
Platelets: 303 10*3/uL (ref 150–400)
RBC: 3.75 MIL/uL — ABNORMAL LOW (ref 3.87–5.11)
RDW: 14.2 % (ref 11.5–15.5)
WBC: 5.9 10*3/uL (ref 4.0–10.5)

## 2016-04-23 LAB — I-STAT TROPONIN, ED: Troponin i, poc: 0 ng/mL (ref 0.00–0.08)

## 2016-04-23 LAB — I-STAT CG4 LACTIC ACID, ED: Lactic Acid, Venous: 1.23 mmol/L (ref 0.5–2.0)

## 2016-04-23 IMAGING — DX DG CHEST 2V
2 series · 2 of 2 positions shown · non-contrast
Comparison: A chest radiograph [DATE]

CLINICAL DATA: Patient with productive cough.  Fatigue.

EXAM:
CHEST  2 VIEW

[chest lat]
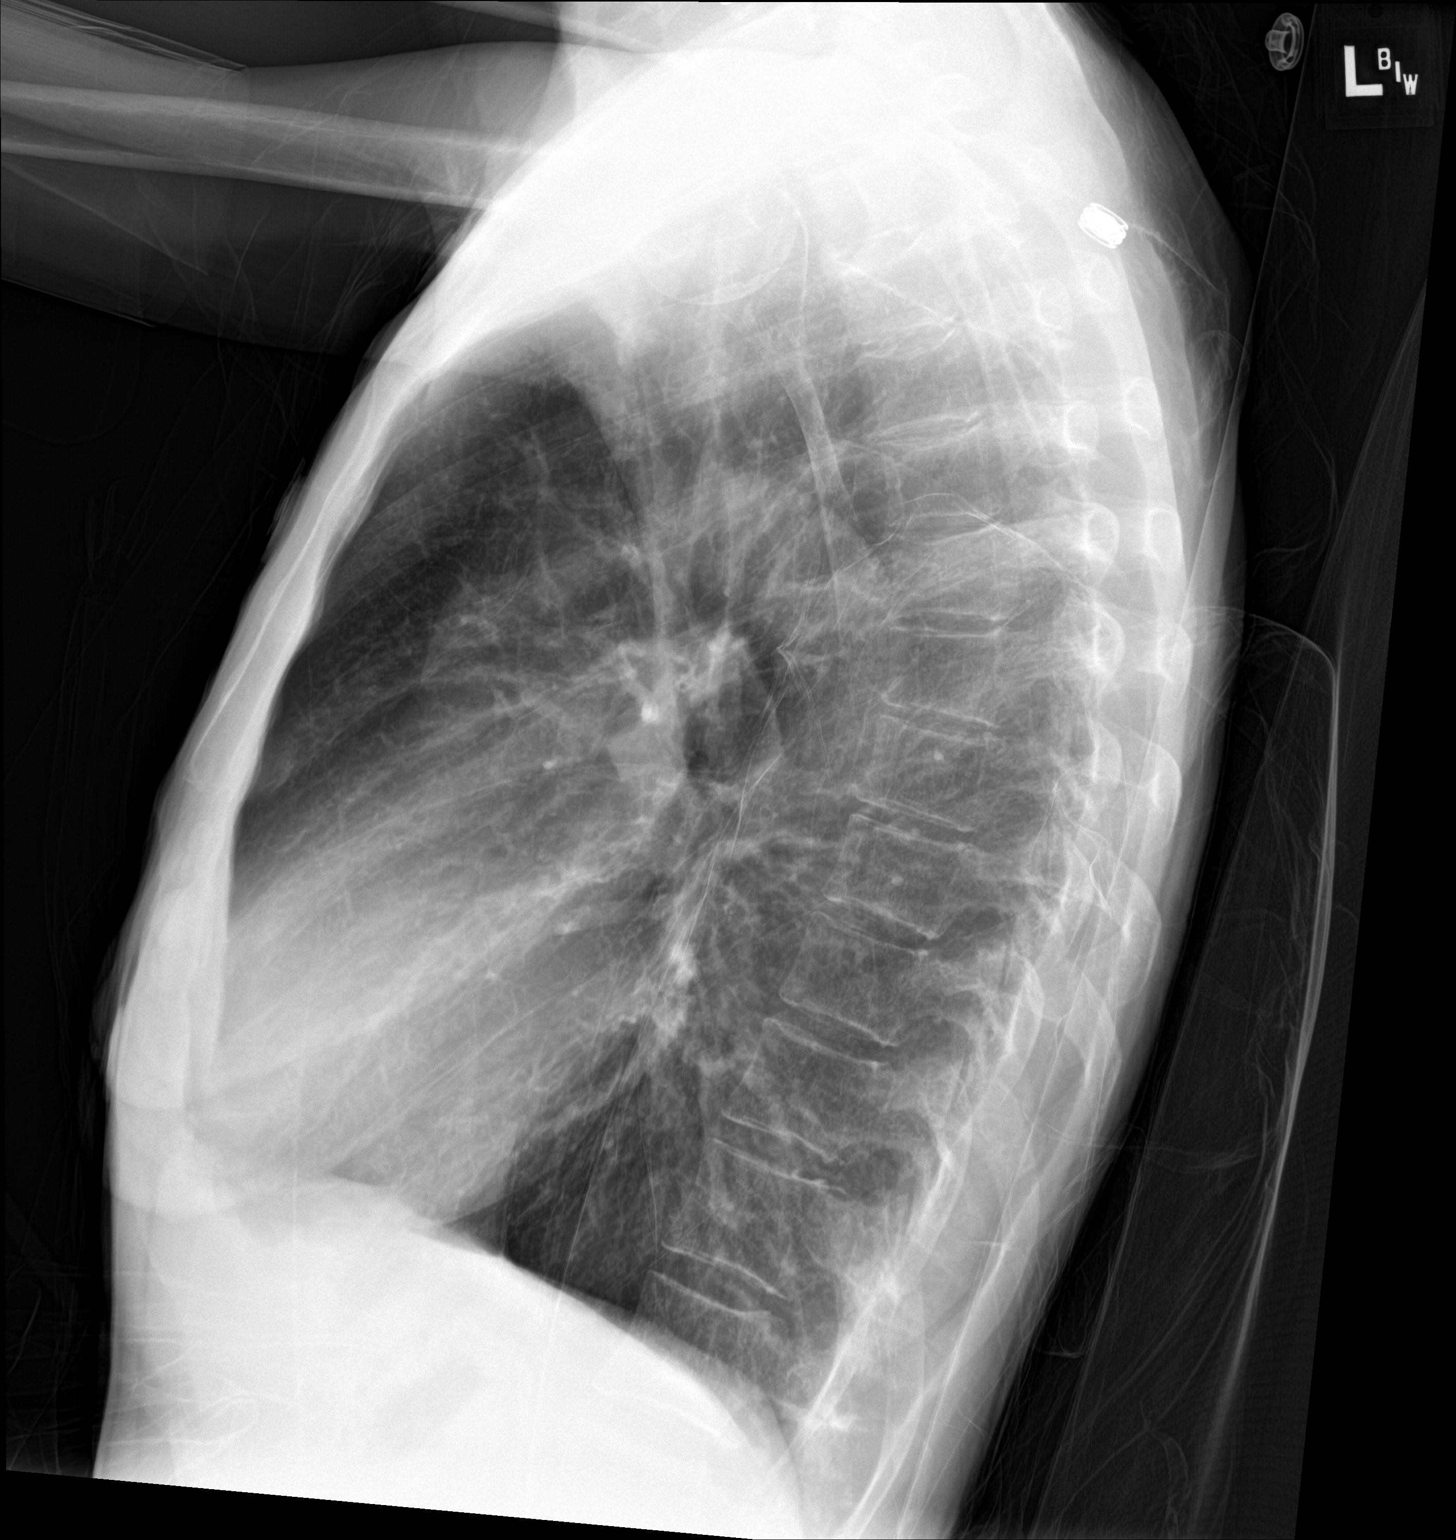

[chest ap]
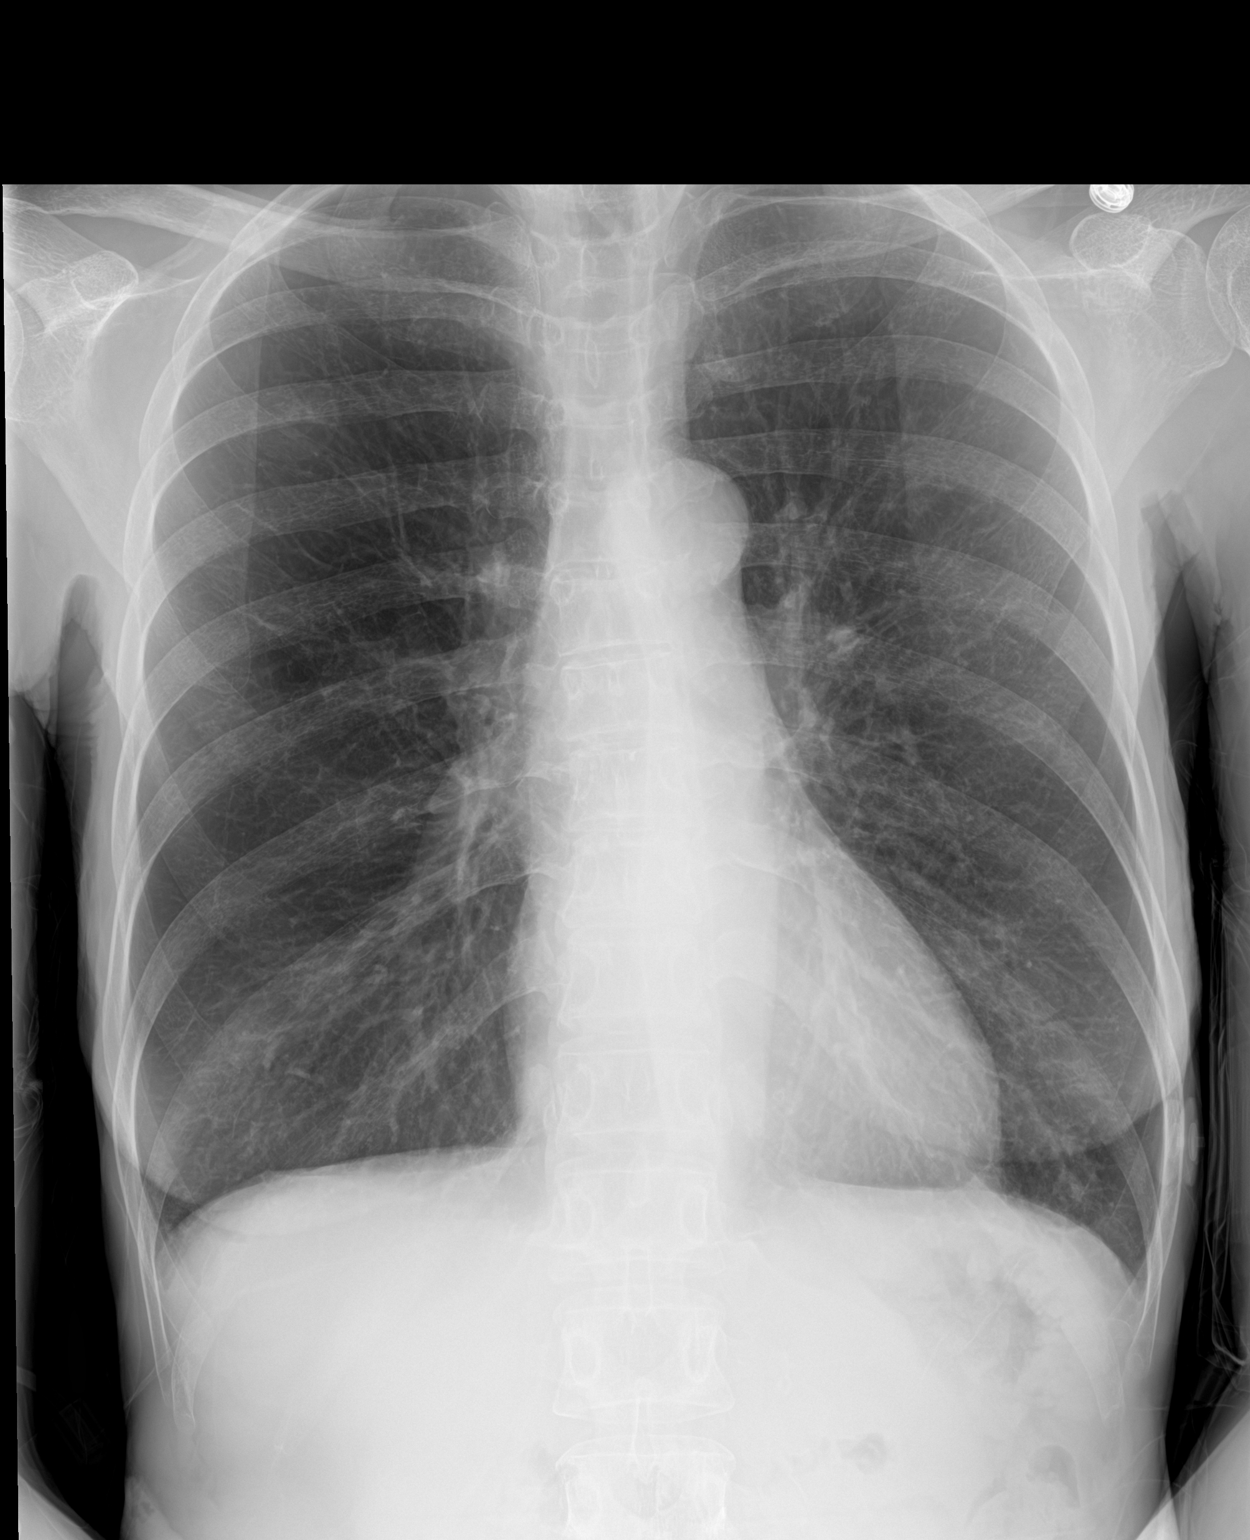

[2 of 2 positions shown; findings below may reference images not displayed]

FINDINGS: Stable cardiac and mediastinal contours. No consolidative pulmonary
opacities. No pleural effusion or pneumothorax. Pulmonary
hyperinflation. Regional skeleton is unremarkable.
IMPRESSION: No acute cardiopulmonary process.

## 2016-04-23 MED ORDER — AMLODIPINE BESYLATE 10 MG PO TABS: 10.0000 mg | ORAL_TABLET | Freq: Every day | ORAL | Status: DC

## 2016-04-23 MED ORDER — BACITRACIN ZINC 500 UNIT/GM EX OINT: TOPICAL_OINTMENT | Freq: Once | CUTANEOUS | Status: DC

## 2016-04-23 MED ORDER — BENZONATATE 100 MG PO CAPS
100.0000 mg | ORAL_CAPSULE | Freq: Once | ORAL | Status: AC
  Administered 2016-04-23: 100 mg via ORAL
  Filled 2016-04-23: qty 1

## 2016-04-23 MED ORDER — BENZONATATE 100 MG PO CAPS: 100.0000 mg | ORAL_CAPSULE | Freq: Three times a day (TID) | ORAL | Status: DC

## 2016-04-23 MED ORDER — ONDANSETRON 4 MG PO TBDP: 4.0000 mg | ORAL_TABLET | Freq: Three times a day (TID) | ORAL | Status: DC | PRN

## 2016-04-23 MED ORDER — SODIUM CHLORIDE 0.9 % IV BOLUS (SEPSIS)
1000.0000 mL | Freq: Once | INTRAVENOUS | Status: AC
  Administered 2016-04-23: 1000 mL via INTRAVENOUS

## 2016-04-23 MED ORDER — ONDANSETRON HCL 4 MG/2ML IJ SOLN
4.0000 mg | Freq: Once | INTRAMUSCULAR | Status: AC
  Administered 2016-04-23: 4 mg via INTRAVENOUS
  Filled 2016-04-23: qty 2

## 2016-04-23 NOTE — Discharge Instructions (Signed)
You have been seen today for cough, fatigue, and nausea. Your imaging and lab tests showed no abnormalities. Your symptoms are consistent with a viral illness. Viruses do not require antibiotics. Treatment is symptomatic care. Drink plenty of fluids and get plenty of rest. You should be drinking at least a liter of water an hour to stay hydrated. Ibuprofen or Tylenol for pain or fever. Zofran for nausea. Tessalon for cough. Plain Mucinex may help relieve congestion. Follow up with PCP as needed. Return to ED should symptoms worsen.  RESOURCE GUIDE  Chronic Pain Problems: Contact [facility]  [phone] Patients need to be referred by their primary care doctor.  Insufficient Money for Medicine: Contact [employer]:  call "211" or [place] [phone].  No Primary Care Doctor: - Call [facility]  [phone] - can help you locate a primary care doctor that  accepts your insurance, provides certain services, etc. - Physician Referral Service- [phone]  Agencies that provide inexpensive medical care: - [facility]  [facility]  [phone] - [facility]  [phone] - [facility]  [phone] - Planned Parenthood  [phone] - [facility]  [phone]  [place] Providers: - [facility]- [address]  [phone], Mon-Fri 9am-7pm, Sat 9am-1pm - [place], [address] [place]  [place], [address] [place]  [place]- [address]  [place], [address], [phone]  Only accepts [place] patients after they have their name  applied to their card  Self Pay (no insurance) in [place]: - Sickle Cell Patients: Dr Kevan Ny, [facility]  [place],  [facility]- [place]  [facility] [place]- [id number] [place] [address], [facility]- see information above (Speak to [employer] if you do not have insurance)       -  [facility]- [place], [place],  [place], [place]  Dr Vista Lawman-  [address], [place], [facility]- [address] [phone]       -  [facility]- [address], [place], also [address] [phone]       -    [facility]- [address], [place], 1st & 3rd Saturday   every month, 10am-1pm  1) Find a Doctor and Pay Out of Pocket Although you won't have to find out who is covered by your insurance plan, it is a good idea to ask around and get recommendations. You will then need to call the office and see if the doctor you have chosen will accept you as a new patient and what types of options they offer for patients who are self-pay. Some doctors offer discounts or will set up payment plans for their patients who  do not have insurance, but you will need to ask so you aren't surprised when you get to your appointment.  2) Contact Your Local Health Department Not all health departments have doctors that can see patients for sick visits, but many do, so it is worth a call to see if yours does. If you don't know where your local health department is, you can check in your phone book. The CDC also has a tool to help you locate your state's health department, and many state websites also have listings of all of their local health departments.  3) Find a [facility] If your illness is not likely to be very severe or complicated, you may want to try a walk in clinic. These are popping up all over the country in pharmacies,  drugstores, and shopping centers. They're usually staffed by nurse practitioners or physician assistants that have been trained to treat common illnesses and complaints. They're usually fairly quick and inexpensive. However, if you have serious medical issues or chronic medical problems, these are probably not your best option  STD Testing - [place], [facility], [address], [place], phone [phone] or [phone].  Monday - Friday, call for an appointment. - [place], [facility] Clinic, [place] [address], [place], phone [phone] or [phone].  Monday - Friday, call for an appointment.  Abuse/Neglect: - [place] [phone] - [place] [phone] (After Hours)  Emergency Shelter:  [facility] [phone]  Maternity Homes: - Room at the [place] [phone] - [place] [phone]  MRSA Hotline #:   332 198 3411  [facility] [place] Dept. [address] [place]         [place] Phone:  [phone]                                  Phone:  [phone]                   Phone:  [phone]  [place], [place] in [place], [address],                                  [place] [phone] or [phone] (After Hours)   [place]  Substance Abuse Resources: - Alcohol and Drug Services  [place]  [phone] - [facility] [place] [phone] -  [place] [phone] - Residential & Outpatient Substance Abuse Program  [phone]  Psychological Services: - [place]  [phone] Services  [place], [place] [address] [place], [place]: [phone] or [phone], [employer]  Dental Assistance  If unable to pay or uninsured, contact:  [facility] or [facility]. to become qualified for the adult dental clinic.  Patients with Medicaid: [facility] [id number], [place] [address], [phone]  If unable to pay, or uninsured, contact [facility] [phone]) or [place] [phone] in [place], [place] in [facility]) to become qualified for the adult dental clinic   Other [place]- [place], [place], [place], [address], [place], [place], 2nd and 4th Thursday of the month at 6:30am.  10 clients each day by appointment, can sometimes see walk-in patients if someone does not show for an appointment. [facility]- [address] [place], [place], [address], [place], [place], [place], [address], [place] Department- [place] Department- [place] Department- [phone]

## 2016-04-23 NOTE — ED Provider Notes (Signed)
Patient previously discharged....requests refill of her norvasc 10 mg a day - provided #30 rx.     Lajean Saver, MD 04/23/16 [phone]

## 2016-04-23 NOTE — ED Provider Notes (Signed)
CSN: [id number]     Arrival date & time 04/23/16  1206 History   First MD Initiated Contact with Patient 04/23/16 1255     No chief complaint on file.    (Consider location/radiation/quality/duration/timing/severity/associated sxs/prior Treatment) HPI   Janice Williams is a 55 y.o. female, with a history of HIV, hypertension, and distant substance abuse, presenting to the ED with generalized weakness and fatigue for the last two days. Pt states she can not eat because the smell of food makes her nauseous. One episode of emesis two days ago. Also endorses a cough and congestion. Pt states she has felt this way before and at that time she was found to be dehydrated. Pt endorses compliance with antivirals. Pt denies shortness of breath, chest pain, dizziness, abdominal pain, constipation/diarrhea, urinary complaints, hematochezia, or any other complaints. Last CD4 count and HIV viral load was performed on 01/25/2016 and were 820 and 31, respectively.    Past Medical History  Diagnosis Date  . HIV infection ([place])   . Substance abuse     history, clean 7 years  . Hypertension    History reviewed. No pertinent past surgical history. History reviewed. No pertinent family history. Social History  Substance Use Topics  . Smoking status: Former Smoker -- 0.40 packs/day    Types: Cigarettes    Start date: 12/18/2012  . Smokeless tobacco: Never Used  . Alcohol Use: 0.0 oz/week    0 Standard drinks or equivalent per week     Comment: "occasional"    OB History    No data available     Review of Systems  Constitutional: Positive for activity change, appetite change and fatigue. Negative for fever, chills and diaphoresis.  Respiratory: Positive for cough. Negative for shortness of breath.   Cardiovascular: Negative for chest pain.  Gastrointestinal: Positive for nausea and vomiting (resolved). Negative for abdominal pain, diarrhea, constipation and blood in stool.  Genitourinary: Negative  for dysuria, hematuria and flank pain.  Skin: Negative for color change and pallor.  Neurological: Negative for dizziness, light-headedness and headaches.  All other systems reviewed and are negative.     Allergies  Chantix and Penicillins  Home Medications   Prior to Admission medications   Medication Sig Start Date End Date Taking? Authorizing Provider  amLODipine (NORVASC) 10 MG tablet Take 1 tablet (10 mg total) by mouth daily. 12/31/14  Yes Carlyle Basques, MD  elvitegravir-cobicistat-emtricitabine-tenofovir (GENVOYA) 150-150-200-10 MG TABS tablet Take 1 tablet by mouth daily with breakfast. 10/26/15  Yes Carlyle Basques, MD  guaiFENesin (MUCINEX) 600 MG 12 hr tablet Take 600 mg by mouth 2 (two) times daily as needed for cough or to loosen phlegm.   Yes Historical Provider, MD  hydrocortisone cream 1 % Apply to affected area 3-4 times daily Patient taking differently: Apply 1 application topically daily as needed for itching. Apply to affected area 3-4 times daily 03/28/16  Yes Alexandra M Law, PA-C  omeprazole (PRILOSEC) 20 MG capsule Take 1 capsule (20 mg total) by mouth daily. 01/13/16  Yes Carlyle Basques, MD  pravastatin (PRAVACHOL) 20 MG tablet Take 1 tablet (20 mg total) by mouth daily. 10/26/15  Yes Carlyle Basques, MD  amLODipine (NORVASC) 10 MG tablet Take 1 tablet (10 mg total) by mouth daily. 04/23/16   Lajean Saver, MD  benzonatate (TESSALON) 100 MG capsule Take 1 capsule (100 mg total) by mouth every 8 (eight) hours. 04/23/16   Kadelyn Dimascio C Hollister Wessler, PA-C  ENSURE (ENSURE) Take 237 mLs by mouth 3 (  three) times daily between meals. 02/09/16   Carlyle Basques, MD  methocarbamol (ROBAXIN) 500 MG tablet Take 1 tablet (500 mg total) by mouth every 8 (eight) hours as needed for muscle spasms. 02/09/16   Carlyle Basques, MD  metroNIDAZOLE (FLAGYL) 500 MG tablet Take 1 tablet (500 mg total) by mouth 2 (two) times daily. Patient not taking: Reported on 04/23/2016 03/28/16   Bea Graff Law, PA-C  ondansetron  (ZOFRAN ODT) 4 MG disintegrating tablet Take 1 tablet (4 mg total) by mouth every 8 (eight) hours as needed for nausea or vomiting. 04/23/16   Brennan Litzinger C Deaken Jurgens, PA-C  oxyCODONE-acetaminophen (PERCOCET/ROXICET) 5-325 MG per tablet Take 1 tablet by mouth every 6 (six) hours as needed for severe pain. 04/29/15   Montine Circle, PA-C  polyethylene glycol [facility] / GLYCOLAX) packet Take one capful in 8 oz of fluid of your choice twice a day until having soft stools, then back down to once a day.  If not having soft stools with twice a day, increased to three times a day 07/21/15   Linton Flemings, MD  pramoxine (PROCTOFOAM) 1 % foam Place 1 application rectally 3 (three) times daily as needed for itching. 07/21/15   Linton Flemings, MD  traMADol (ULTRAM) 50 MG tablet Take 1 tablet (50 mg total) by mouth 2 (two) times daily. 03/15/15   Truman Hayward, MD  [place] Haz[place] (TUCKS) 50 % PADS Use as needed for rectal itching, after having BM 07/21/15   Linton Flemings, MD   BP 138/80 mmHg  Pulse 90  Temp(Src) 98.7 F (37.1 C) (Oral)  Resp 21  Ht 5\' 3"  (1.6 m)  Wt 38.329 kg  BMI 14.97 kg/m2  SpO2 95% Physical Exam  Constitutional: She appears well-developed and well-nourished. No distress.  HENT:  Head: Normocephalic and atraumatic.  Eyes: Conjunctivae are normal. Pupils are equal, round, and reactive to light.  Neck: Neck supple.  Cardiovascular: Normal rate, regular rhythm, normal heart sounds and intact distal pulses.   Pulmonary/Chest: Effort normal. No respiratory distress. She has wheezes in the right upper field, the right middle field and the right lower field.  Abdominal: Soft. There is no tenderness. There is no guarding.  Musculoskeletal: She exhibits no edema or tenderness.  Lymphadenopathy:    She has no cervical adenopathy.  Neurological: She is alert.  Skin: Skin is warm and dry. She is not diaphoretic.  Psychiatric: She has a normal mood and affect. Her behavior is normal.  Nursing note and vitals  reviewed.   ED Course  Procedures (including critical care time) Labs Review Labs Reviewed  URINALYSIS, ROUTINE W REFLEX MICROSCOPIC (NOT AT [facility]) - Abnormal; Notable for the following:    APPearance CLOUDY (*)    Ketones, ur 15 (*)    All other components within normal limits  COMPREHENSIVE METABOLIC PANEL - Abnormal; Notable for the following:    Chloride 100 (*)    Glucose, Bld 101 (*)    Creatinine, Ser 1.05 (*)    ALT 11 (*)    GFR calc non Af Amer 59 (*)    All other components within normal limits  CBC WITH DIFFERENTIAL/PLATELET - Abnormal; Notable for the following:    RBC 3.75 (*)    All other components within normal limits  INFLUENZA PANEL BY PCR (TYPE A & B, H1N1)  I-STAT CG4 LACTIC ACID, ED  Randolm Idol, ED    Imaging Review Dg Chest 2 View  04/23/2016  CLINICAL DATA:  Patient with productive  cough.  Fatigue. EXAM: CHEST  2 VIEW COMPARISON:  A chest radiograph 03/22/2008 FINDINGS: Stable cardiac and mediastinal contours. No consolidative pulmonary opacities. No pleural effusion or pneumothorax. Pulmonary hyperinflation. Regional skeleton is unremarkable. IMPRESSION: No acute cardiopulmonary process. Electronically Signed   By: Lovey Newcomer M.D.   On: 04/23/2016 14:50   I have personally reviewed and evaluated these images and lab results as part of my medical decision-making.   EKG Interpretation   Date/Time:  Sunday Apr 23 2016 13:32:00 EDT Ventricular Rate:  78 PR Interval:  141 QRS Duration: 88 QT Interval:  409 QTC Calculation: 466 R Axis:   75 Text Interpretation:  Sinus rhythm Probable left atrial enlargement  Nonspecific repolarization abnormalities Baseline wander in lead(s) V6  artifact noted Confirmed by Holt ([id number]) on 04/23/2016  2:22:41 PM      MDM   Final diagnoses:  URI (upper respiratory infection)    Janice Williams presents with fatigue, generalized weakness, cough, and nausea for the last 2 days.  Findings and  plan of care discussed with Ripley Fraise, MD.  Suspect a viral illness. Patient is nontoxic appearing, afebrile, not tachycardic, not tachypneic, maintains SPO2 of 97% on room air, and is in no apparent distress. Patient has no signs of sepsis or other serious or life-threatening condition. CD4 count and HIV viral load are adequate. Patient's wheezes were noted and a DuoNeb was ordered, however, patient declined this breathing treatment stating that she doesn't like how they make her feel. Patient does not show signs of increased work of breathing so I think this decision is fine. Chest x-ray is clear. No indications for antibiotics at this time. Patient improved after treatment here in the ED. Patient to follow up with PCP as needed. Home care and return precautions discussed. Patient voiced stenting of these instructions and is comfortable with discharge.  Filed Vitals:   04/23/16 1211 04/23/16 1430 04/23/16 1445  BP: 135/108 133/92 145/90  Pulse: 94 78 80  Temp: 98.7 F (37.1 C)    TempSrc: Oral    Resp: 16 16 17   Height: 5\' 3"  (1.6 m)    Weight: 38.329 kg    SpO2: 97% 96% 94%   Filed Vitals:   04/23/16 1530 04/23/16 1545 04/23/16 1600 04/23/16 1615  BP: 133/83 133/73 122/88 138/80  Pulse: 75 81 87 90  Temp:      TempSrc:      Resp: 15 17 20 21   Height:      Weight:      SpO2: 96% 95% 96% 95%       Lorayne Bender, PA-C 04/24/16 0559  Ripley Fraise, MD 04/24/16 1553

## 2016-04-23 NOTE — ED Notes (Signed)
N/V on Friday. Vomited x 1. Continues to feel tired and no energy.

## 2016-04-24 ENCOUNTER — Other Ambulatory Visit: Payer: Self-pay | Admitting: *Deleted

## 2016-04-24 DIAGNOSIS — I10 Essential (primary) hypertension: Secondary | ICD-10-CM

## 2016-04-24 MED ORDER — AMLODIPINE BESYLATE 10 MG PO TABS
10.0000 mg | ORAL_TABLET | Freq: Every day | ORAL | Status: DC
Start: 2016-04-24 — End: 2017-06-14

## 2016-05-03 ENCOUNTER — Other Ambulatory Visit: Payer: Self-pay | Admitting: *Deleted

## 2016-05-03 DIAGNOSIS — B20 Human immunodeficiency virus [HIV] disease: Secondary | ICD-10-CM

## 2016-05-04 ENCOUNTER — Other Ambulatory Visit: Payer: Medicaid Other

## 2016-05-08 ENCOUNTER — Other Ambulatory Visit: Payer: Medicaid Other

## 2016-05-08 DIAGNOSIS — B20 Human immunodeficiency virus [HIV] disease: Secondary | ICD-10-CM

## 2016-05-08 LAB — COMPLETE METABOLIC PANEL WITH GFR
ALT: 10 U/L (ref 6–29)
AST: 19 U/L (ref 10–35)
Albumin: 4.2 g/dL (ref 3.6–5.1)
Alkaline Phosphatase: 55 U/L (ref 33–130)
BUN: 21 mg/dL (ref 7–25)
CO2: 28 mmol/L (ref 20–31)
Calcium: 9.4 mg/dL (ref 8.6–10.4)
Chloride: 101 mmol/L (ref 98–110)
Creat: 0.98 mg/dL (ref 0.50–1.05)
GFR, Est African American: 75 mL/min (ref >=60)
GFR, Est Non African American: 65 mL/min (ref >=60)
Glucose, Bld: 109 mg/dL — ABNORMAL HIGH (ref 65–99)
Potassium: 4.6 mmol/L (ref 3.5–5.3)
Sodium: 137 mmol/L (ref 135–146)
Total Bilirubin: 0.4 mg/dL (ref 0.2–1.2)
Total Protein: 7.3 g/dL (ref 6.1–8.1)

## 2016-05-08 LAB — CBC WITH DIFFERENTIAL/PLATELET
Basophils Absolute: 40 cells/uL (ref 0–200)
Basophils Relative: 1 %
Eosinophils Absolute: 40 cells/uL (ref 15–500)
Eosinophils Relative: 1 %
HCT: 34.2 % — ABNORMAL LOW (ref 35.0–45.0)
Hemoglobin: 11.2 g/dL — ABNORMAL LOW (ref 11.7–15.5)
Lymphocytes Relative: 41 %
Lymphs Abs: 1640 cells/uL (ref 850–3900)
MCH: 32.5 pg (ref 27.0–33.0)
MCHC: 32.7 g/dL (ref 32.0–36.0)
MCV: 99.1 fL (ref 80.0–100.0)
MPV: 8.9 fL (ref 7.5–12.5)
Monocytes Absolute: 280 cells/uL (ref 200–950)
Monocytes Relative: 7 %
Neutro Abs: 2000 cells/uL (ref 1500–7800)
Neutrophils Relative %: 50 %
Platelets: 411 10*3/uL — ABNORMAL HIGH (ref 140–400)
RBC: 3.45 MIL/uL — ABNORMAL LOW (ref 3.80–5.10)
RDW: 15.4 % — ABNORMAL HIGH (ref 11.0–15.0)
WBC: 4 10*3/uL (ref 3.8–10.8)

## 2016-05-09 LAB — T-HELPER CELL (CD4) - (RCID CLINIC ONLY)
CD4 % Helper T Cell: 35 % (ref 33–55)
CD4 T Cell Abs: 620 /uL (ref 400–2700)

## 2016-05-09 LAB — HIV-1 RNA QUANT-NO REFLEX-BLD
HIV 1 RNA Quant: <20 copies/mL (ref <20)
HIV-1 RNA Quant, Log: <1.3 Log copies/mL (ref <1.30)

## 2016-05-18 ENCOUNTER — Encounter: Payer: Self-pay | Admitting: Internal Medicine

## 2016-05-18 ENCOUNTER — Other Ambulatory Visit: Payer: Self-pay | Admitting: *Deleted

## 2016-05-18 ENCOUNTER — Ambulatory Visit (INDEPENDENT_AMBULATORY_CARE_PROVIDER_SITE_OTHER): Payer: Medicaid Other | Admitting: Internal Medicine

## 2016-05-18 VITALS — Ht 64.0 in | Wt 86.0 lb

## 2016-05-18 DIAGNOSIS — E43 Unspecified severe protein-calorie malnutrition: Secondary | ICD-10-CM | POA: Diagnosis not present

## 2016-05-18 DIAGNOSIS — M1612 Unilateral primary osteoarthritis, left hip: Secondary | ICD-10-CM | POA: Diagnosis not present

## 2016-05-18 DIAGNOSIS — B20 Human immunodeficiency virus [HIV] disease: Secondary | ICD-10-CM

## 2016-05-18 DIAGNOSIS — K649 Unspecified hemorrhoids: Secondary | ICD-10-CM

## 2016-05-18 MED ORDER — TRAMADOL HCL 50 MG PO TABS: 50.0000 mg | ORAL_TABLET | Freq: Two times a day (BID) | ORAL | Status: DC | PRN

## 2016-05-18 MED ORDER — PRAMOXINE HCL 1 % RE FOAM: 1.0000 "application " | Freq: Three times a day (TID) | RECTAL | Status: DC | PRN
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
## 2016-05-29 ENCOUNTER — Telehealth: Payer: Self-pay | Admitting: *Deleted

## 2016-05-29 ENCOUNTER — Other Ambulatory Visit: Payer: Self-pay | Admitting: Internal Medicine

## 2016-05-29 DIAGNOSIS — Z780 Asymptomatic menopausal state: Secondary | ICD-10-CM

## 2016-05-29 DIAGNOSIS — E2839 Other primary ovarian failure: Secondary | ICD-10-CM

## 2016-05-29 DIAGNOSIS — B20 Human immunodeficiency virus [HIV] disease: Secondary | ICD-10-CM

## 2016-05-29 NOTE — Telephone Encounter (Signed)
Calling for authorization of dexa scan per office notes 6/1. Order corrected per [facility].  They will contact patient to schedule. Spoke with Lattie Haw at [facility], no prior authorization is required for the cpt image [phone].  Reference [id number]. Landis Gandy, RN

## 2016-06-09 ENCOUNTER — Ambulatory Visit (INDEPENDENT_AMBULATORY_CARE_PROVIDER_SITE_OTHER): Payer: Medicaid Other | Admitting: *Deleted

## 2016-06-09 ENCOUNTER — Telehealth: Payer: Self-pay | Admitting: *Deleted

## 2016-06-09 ENCOUNTER — Other Ambulatory Visit (HOSPITAL_COMMUNITY)
Admission: RE | Admit: 2016-06-09 | Discharge: 2016-06-09 | Disposition: A | Discharge status: Home / Self Care | Payer: Medicaid Other | Source: Ambulatory Visit | Attending: Internal Medicine | Admitting: Internal Medicine

## 2016-06-09 DIAGNOSIS — Z0000 Encounter for general adult medical examination without abnormal findings: Secondary | ICD-10-CM

## 2016-06-09 DIAGNOSIS — Z124 Encounter for screening for malignant neoplasm of cervix: Secondary | ICD-10-CM

## 2016-06-09 DIAGNOSIS — Z01419 Encounter for gynecological examination (general) (routine) without abnormal findings: Secondary | ICD-10-CM | POA: Insufficient documentation

## 2016-06-09 DIAGNOSIS — Z113 Encounter for screening for infections with a predominantly sexual mode of transmission: Secondary | ICD-10-CM | POA: Diagnosis not present

## 2016-06-09 DIAGNOSIS — E785 Hyperlipidemia, unspecified: Secondary | ICD-10-CM

## 2016-06-09 NOTE — Patient Instructions (Signed)
RN advises patient to increase fluid intake, exercise and roughage in her diet to help with hemorrhoids.  PAP smear results will be back in about a week.  Thank you for coming to the Center for your care.  Langley Gauss, RN

## 2016-06-09 NOTE — Telephone Encounter (Signed)
Follow-up on referral to PCP from last office visit with Dr.Snider.  Wants to go to [facility] at [facility].  Pt's current [employer] card reads [employer].  RN advised the patient to call her Medicaid Case Manager to change PCP to [facility].  Patient stated that she would follow up on this.  RN will make referral to [facility] at [facility].  RN spoke with the patient again.  She already has an appointment for July 17th at [facility] at [place].
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
## 2016-06-12 LAB — CERVICOVAGINAL ANCILLARY ONLY
Chlamydia: NEGATIVE
Neisseria Gonorrhea: NEGATIVE

## 2016-06-12 LAB — CYTOLOGY - PAP

## 2016-06-13 ENCOUNTER — Other Ambulatory Visit: Payer: Self-pay | Admitting: Internal Medicine

## 2016-06-13 DIAGNOSIS — N63 Unspecified lump in unspecified breast: Secondary | ICD-10-CM

## 2016-06-13 DIAGNOSIS — B20 Human immunodeficiency virus [HIV] disease: Secondary | ICD-10-CM

## 2016-06-15 ENCOUNTER — Other Ambulatory Visit: Payer: Self-pay

## 2016-06-15 ENCOUNTER — Telehealth: Payer: Self-pay

## 2016-06-15 DIAGNOSIS — M1612 Unilateral primary osteoarthritis, left hip: Secondary | ICD-10-CM

## 2016-06-15 MED ORDER — TRAMADOL HCL 50 MG PO TABS: 50.0000 mg | ORAL_TABLET | Freq: Two times a day (BID) | ORAL | Status: DC | PRN

## 2016-06-15 NOTE — Telephone Encounter (Signed)
Patient is calling to have Tramadol sent to pharmacy. She wanted to make sure it is the new dose.  She stated at lat office visit Dr Baxter Flattery increased dose to 50 mgs. I will check her medication list to verify and send to pharmacy if documented.   Laverle Patter, RN   Medication change documented.  Called to pharmacy.    Laverle Patter, RN

## 2016-06-26 ENCOUNTER — Ambulatory Visit
Admission: RE | Admit: 2016-06-26 | Discharge: 2016-06-26 | Disposition: A | Discharge status: Home / Self Care | Payer: Medicaid Other | Source: Ambulatory Visit | Attending: Internal Medicine | Admitting: Internal Medicine

## 2016-06-26 DIAGNOSIS — N63 Unspecified lump in unspecified breast: Secondary | ICD-10-CM

## 2016-06-26 DIAGNOSIS — M810 Age-related osteoporosis without current pathological fracture: Secondary | ICD-10-CM | POA: Diagnosis not present

## 2016-06-26 DIAGNOSIS — B20 Human immunodeficiency virus [HIV] disease: Secondary | ICD-10-CM

## 2016-06-26 DIAGNOSIS — E2839 Other primary ovarian failure: Secondary | ICD-10-CM

## 2016-06-26 DIAGNOSIS — Z780 Asymptomatic menopausal state: Secondary | ICD-10-CM | POA: Diagnosis not present

## 2016-06-28 ENCOUNTER — Encounter: Payer: Self-pay | Admitting: *Deleted

## 2016-07-03 DIAGNOSIS — Z0000 Encounter for general adult medical examination without abnormal findings: Secondary | ICD-10-CM | POA: Diagnosis not present

## 2016-07-20 ENCOUNTER — Telehealth: Payer: Self-pay | Admitting: *Deleted

## 2016-07-20 NOTE — Telephone Encounter (Signed)
Patient called and asked if the doctor could send in a suppository for her hemorrhoids. She advised the cream is messy and not working very well. When she uses the cream she has to stay inside even though she uses pads or panty liners. She also advised that when she went to the ED she was given a suppository and it was much better. Advised the patient will ask her doctor and get back to her with an answer as soon as possible. She advised to send the Rx to [facility] [address] if the doctor says yes.

## 2016-07-27 NOTE — Telephone Encounter (Signed)
Patient called to enquire if Dr. Baxter Flattery had responded to her request for suppositories.  Janice Williams

## 2016-07-28 ENCOUNTER — Other Ambulatory Visit: Payer: Self-pay | Admitting: Internal Medicine

## 2016-07-28 MED ORDER — HYDROCORTISONE ACETATE 25 MG RE SUPP: 25.0000 mg | Freq: Two times a day (BID) | RECTAL | 0 refills | Status: DC

## 2016-07-28 NOTE — Telephone Encounter (Signed)
I have sent in rx for suppositories

## 2016-07-28 NOTE — Telephone Encounter (Signed)
Patient aware.

## 2016-08-08 ENCOUNTER — Other Ambulatory Visit: Payer: Self-pay | Admitting: Internal Medicine

## 2016-08-08 DIAGNOSIS — K2100 Gastro-esophageal reflux disease with esophagitis, without bleeding: Secondary | ICD-10-CM

## 2016-08-11 ENCOUNTER — Other Ambulatory Visit: Payer: Self-pay | Admitting: Internal Medicine

## 2016-08-11 DIAGNOSIS — M1612 Unilateral primary osteoarthritis, left hip: Secondary | ICD-10-CM

## 2016-08-22 ENCOUNTER — Ambulatory Visit: Payer: Medicaid Other | Admitting: Internal Medicine

## 2016-08-31 ENCOUNTER — Ambulatory Visit: Payer: Medicaid Other | Admitting: Internal Medicine

## 2016-09-19 ENCOUNTER — Other Ambulatory Visit: Payer: Self-pay | Admitting: *Deleted

## 2016-09-19 DIAGNOSIS — R634 Abnormal weight loss: Secondary | ICD-10-CM

## 2016-09-19 MED ORDER — ENSURE PO LIQD: 237.0000 mL | Freq: Three times a day (TID) | ORAL | 5 refills | Status: DC

## 2016-10-11 ENCOUNTER — Other Ambulatory Visit: Payer: Self-pay | Admitting: *Deleted

## 2016-10-11 NOTE — Telephone Encounter (Signed)
Patient is requesting refill for Tramadol for her osteoarthritis.  She has an appointment with Dr. Baxter Flattery for 10/24/16.  Dexa Scan results available for review.  Dr. Baxter Flattery please advise about refill of tramadol, # of tabs, # of refills.

## 2016-10-12 NOTE — Telephone Encounter (Signed)
Patient checking on status of refill. Landis Gandy, RN

## 2016-10-12 NOTE — Telephone Encounter (Signed)
I am not sure about the DEXA scan results.Marland KitchenMarland Kitchen

## 2016-10-13 ENCOUNTER — Other Ambulatory Visit: Payer: Self-pay | Admitting: *Deleted

## 2016-10-13 DIAGNOSIS — M1612 Unilateral primary osteoarthritis, left hip: Secondary | ICD-10-CM

## 2016-10-13 MED ORDER — TRAMADOL HCL 50 MG PO TABS: 50.0000 mg | ORAL_TABLET | Freq: Two times a day (BID) | ORAL | 1 refills | Status: DC

## 2016-10-17 NOTE — Telephone Encounter (Signed)
I am fine with doing a tramadol refill but need to have her sign a pain contract and do uds

## 2016-10-19 ENCOUNTER — Emergency Department (HOSPITAL_COMMUNITY)
Admission: EM | Admit: 2016-10-19 | Discharge: 2016-10-19 | Disposition: A | Discharge status: Home / Self Care | Payer: Medicaid Other | Attending: Emergency Medicine | Admitting: Emergency Medicine

## 2016-10-19 ENCOUNTER — Encounter (HOSPITAL_COMMUNITY): Payer: Self-pay | Admitting: *Deleted

## 2016-10-19 DIAGNOSIS — Z87891 Personal history of nicotine dependence: Secondary | ICD-10-CM | POA: Diagnosis not present

## 2016-10-19 DIAGNOSIS — L02412 Cutaneous abscess of left axilla: Secondary | ICD-10-CM | POA: Diagnosis not present

## 2016-10-19 DIAGNOSIS — Z8673 Personal history of transient ischemic attack (TIA), and cerebral infarction without residual deficits: Secondary | ICD-10-CM | POA: Diagnosis not present

## 2016-10-19 DIAGNOSIS — Z79899 Other long term (current) drug therapy: Secondary | ICD-10-CM | POA: Diagnosis not present

## 2016-10-19 DIAGNOSIS — I10 Essential (primary) hypertension: Secondary | ICD-10-CM | POA: Insufficient documentation

## 2016-10-19 MED ORDER — DOXYCYCLINE HYCLATE 100 MG PO CAPS: 100.0000 mg | ORAL_CAPSULE | Freq: Two times a day (BID) | ORAL | 0 refills | Status: DC

## 2016-10-19 MED ORDER — HYDROCODONE-ACETAMINOPHEN 5-325 MG PO TABS: 1.0000 | ORAL_TABLET | Freq: Four times a day (QID) | ORAL | 0 refills | Status: DC | PRN

## 2016-10-19 MED ORDER — LIDOCAINE-EPINEPHRINE 2 %-1:100000 IJ SOLN
10.0000 mL | Freq: Once | INTRAMUSCULAR | Status: AC
  Administered 2016-10-19: 10 mL
  Filled 2016-10-19: qty 10

## 2016-10-19 NOTE — ED Notes (Signed)
SIGNATURE PAD NOT WORKING.

## 2016-10-19 NOTE — ED Triage Notes (Signed)
Pt has abscess under left arm x 2 days. Denies fever.

## 2016-10-19 NOTE — ED Provider Notes (Signed)
[facility] DEPT Provider Note   CSN: [id number] Arrival date & time: 10/19/16  1206   By signing my name below, I, Evelene Croon, attest that this documentation has been prepared under the direction and in the presence of non-physician practitioner, Montine Circle, PA-C. Electronically Signed: Evelene Croon, Scribe. 10/19/2016. 1:19 PM.   History   Chief Complaint Chief Complaint  Patient presents with  . Abscess   The history is provided by the patient. No language interpreter was used.    HPI Comments:  Janice Williams is a 55 y.o. female with a history of HIV, who presents to the Emergency Department complaining of large pruritic lump under her left axilla x 3 days. She has applied warm compresses without relief. Pt reports h/o same states she has an episode 1-2 times a year. Pt has no other complaints or symptoms at this time. No fever.    Past Medical History:  Diagnosis Date  . HIV infection ([place])   . Hypertension   . Substance abuse    history, clean 7 years    Patient Active Problem List   Diagnosis Date Noted  . Dental abscess 03/15/2015  . Knee pain, bilateral 03/15/2015  . Acne cystica 12/22/2011  . Dyslipidemia 07/06/2011  . Routine health maintenance 07/06/2011  . EPIDERMOID CYST 02/10/2011  . ACUTE SINUSITIS, UNSPECIFIED 01/20/2010  . EXTERNAL OTITIS 01/06/2010  . HYPERLIPIDEMIA 06/03/2009  . WEIGHT LOSS 06/03/2009  . CARBUNCLE AND FURUNCLE OF FACE 02/04/2009  . FACIAL RASH 02/04/2009  . Human immunodeficiency virus (HIV) disease ([place]) 10/29/2008  . ANEMIA-NOS 10/29/2008  . PERIPHERAL NEUROPATHY 10/29/2008  . GERD 10/29/2008  . RENAL INSUFFICIENCY 10/29/2008  . CEREBROVASCULAR ACCIDENT, HX OF 10/29/2008  . TOBACCO DEPENDENCE 02/14/2007  . CVA 02/14/2007  . RHINITIS, ALLERGIC 02/14/2007  . DYSPEPSIA 02/14/2007  . ACNE 02/14/2007  . WEIGHT LOSS, ABNORMAL 02/14/2007    History reviewed. No pertinent surgical history.  OB History    No data  available       Home Medications    Prior to Admission medications   Medication Sig Start Date End Date Taking? Authorizing Provider  amLODipine (NORVASC) 10 MG tablet Take 1 tablet (10 mg total) by mouth daily. 04/24/16   Carlyle Basques, MD  benzonatate (TESSALON) 100 MG capsule Take 1 capsule (100 mg total) by mouth every 8 (eight) hours. 04/23/16   Lorayne Bender, PA-C  elvitegravir-cobicistat-emtricitabine-tenofovir (GENVOYA) 150-150-200-10 MG TABS tablet Take 1 tablet by mouth daily with breakfast. 10/26/15   Carlyle Basques, MD  ENSURE (ENSURE) Take 237 mLs by mouth 3 (three) times daily between meals. 09/19/16   Carlyle Basques, MD  guaiFENesin (MUCINEX) 600 MG 12 hr tablet Take 600 mg by mouth 2 (two) times daily as needed for cough or to loosen phlegm.    Historical Provider, MD  hydrocortisone (ANUSOL-HC) 25 MG suppository Place 1 suppository (25 mg total) rectally 2 (two) times daily. 07/28/16   Carlyle Basques, MD  hydrocortisone cream 1 % Apply to affected area 3-4 times daily Patient taking differently: Apply 1 application topically daily as needed for itching. Apply to affected area 3-4 times daily 03/28/16   Frederica Kuster, PA-C  omeprazole (PRILOSEC) 20 MG capsule TAKE 1 CAPSULE BY MOUTH EVERY DAY 08/08/16   Carlyle Basques, MD  polyethylene glycol [facility] / GLYCOLAX) packet Take one capful in 8 oz of fluid of your choice twice a day until having soft stools, then back down to once a day.  If not having soft  stools with twice a day, increased to three times a day 07/21/15   Linton Flemings, MD  pramoxine (PROCTOFOAM) 1 % foam Place 1 application rectally 3 (three) times daily as needed for itching. 05/18/16   Carlyle Basques, MD  pravastatin (PRAVACHOL) 20 MG tablet Take 1 tablet (20 mg total) by mouth daily. 10/26/15   Carlyle Basques, MD  traMADol (ULTRAM) 50 MG tablet Take 1 tablet (50 mg total) by mouth every 12 (twelve) hours. 10/13/16   Carlyle Basques, MD  Witch Hazel (TUCKS) 50 % PADS Use as  needed for rectal itching, after having BM Patient not taking: Reported on 05/18/2016 07/21/15   Linton Flemings, MD    Family History History reviewed. No pertinent family history.  Social History Social History  Substance Use Topics  . Smoking status: Former Smoker    Packs/day: 0.40    Types: Cigarettes    Start date: 12/18/2012  . Smokeless tobacco: Never Used  . Alcohol use 0.0 oz/week     Comment: "occasional"      Allergies   Chantix [varenicline] and Penicillins   Review of Systems Review of Systems  Constitutional: Negative for fever.  Skin: Positive for rash (abscess).     Physical Exam Updated Vital Signs BP 98/68 (BP Location: Right Arm)   Pulse 75   Temp 98.5 F (36.9 C) (Oral)   Resp 16   SpO2 100%   Physical Exam Physical Exam  Constitutional: Pt is oriented to person, place, and time. Pt appears well-developed and well-nourished. No distress.  HENT:  Head: Normocephalic and atraumatic.  Eyes: Conjunctivae are normal. No scleral icterus.  Neck: Normal range of motion.  Cardiovascular: Normal rate, regular rhythm and intact distal pulses.   Pulmonary/Chest: Effort normal and breath sounds normal.  Abdominal: Soft. Pt exhibits no distension. There is no tenderness.  Lymphadenopathy:    Pt has no cervical adenopathy.  Neurological: Pt is alert and oriented to person, place, and time.  Skin: Skin is warm and dry. Pt is not diaphoretic.[name] Left axilla remarkable for 2 x 3 cm abscess with no surrounding erythema. TTP  Psychiatric: Pt has a normal mood and affect.  Nursing note and vitals reviewed.    ED Treatments / Results  DIAGNOSTIC STUDIES:  Oxygen Saturation is 100% on RA, normal by my interpretation.    COORDINATION OF CARE:  1:09 PM Discussed treatment plan with pt at bedside and pt agreed to plan.  Labs (all labs ordered are listed, but only abnormal results are displayed) Labs Reviewed - No data to display  EKG  EKG Interpretation None         Radiology No results found.  Procedures Procedures   INCISION AND DRAINAGE PROCEDURE NOTE: Patient identification was confirmed and verbal consent was obtained. This procedure was performed by Montine Circle, PA-C at 1:11 PM. Site: left axilla Sterile procedures observed Needle size: 27 Anesthetic used (type and amt): lido 2% with epi Blade size: 11 Drainage: copius Complexity: Complex Packing used Site anesthetized, incision made over site, wound drained and explored loculations, rinsed with copious amounts of normal saline, wound packed with sterile gauze, covered with dry, sterile dressing.  Pt tolerated procedure well without complications.  Instructions for care discussed verbally and pt provided with additional written instructions for homecare and f/u.   Medications Ordered in ED Medications  lidocaine-EPINEPHrine (XYLOCAINE W/EPI) 2 %-1:100000 (with pres) injection 10 mL (not administered)    Initial Impression / Assessment and Plan / ED Course  I  have reviewed the triage vital signs and the nursing notes.  Pertinent labs & imaging results that were available during my care of the patient were reviewed by me and considered in my medical decision making (see chart for details).  Clinical Course      Patient with skin abscess. Incision and drainage performed in the ED today.  Abscess was not large enough to warrant packing or drain placement. Supportive care and return precautions discussed.  Pt sent home with doxy. The patient appears reasonably screened and/or stabilized for discharge and I doubt any other emergent medical condition requiring further screening, evaluation, or treatment in the ED prior to discharge.   Final Clinical Impressions(s) / ED Diagnoses   Final diagnoses:  Abscess of axilla, left    New Prescriptions New Prescriptions   DOXYCYCLINE (VIBRAMYCIN) 100 MG CAPSULE    Take 1 capsule (100 mg total) by mouth 2 (two) times daily.    HYDROCODONE-ACETAMINOPHEN (NORCO/VICODIN) 5-325 MG TABLET    Take 1-2 tablets by mouth every 6 (six) hours as needed.   I personally performed the services described in this documentation, which was scribed in my presence. The recorded information has been reviewed and is accurate.       Montine Circle, PA-C 10/19/16 [place], MD 10/19/16 [phone]

## 2016-10-19 NOTE — Discharge Instructions (Signed)
Please take out the packing material in 2 days.  Return if your symptoms worsen.

## 2016-10-19 NOTE — ED Notes (Signed)
Called pharmacy for Lidocaine.

## 2016-10-24 ENCOUNTER — Ambulatory Visit (INDEPENDENT_AMBULATORY_CARE_PROVIDER_SITE_OTHER): Payer: Medicaid Other | Admitting: Internal Medicine

## 2016-10-24 ENCOUNTER — Encounter: Payer: Self-pay | Admitting: Internal Medicine

## 2016-10-24 VITALS — BP 110/69 | HR 71 | Temp 98.6°F | Wt 91.0 lb

## 2016-10-24 DIAGNOSIS — B20 Human immunodeficiency virus [HIV] disease: Secondary | ICD-10-CM

## 2016-10-24 DIAGNOSIS — M19042 Primary osteoarthritis, left hand: Secondary | ICD-10-CM | POA: Diagnosis not present

## 2016-10-24 DIAGNOSIS — M19041 Primary osteoarthritis, right hand: Secondary | ICD-10-CM

## 2016-10-24 NOTE — Progress Notes (Signed)
Patient ID: Janice Williams, female   DOB: Nov 02, 1961, 55 y.o.   MRN: [id number]  HPI 55yo F with HIV disease, CD 4 count 620/VL<20, currently on genvoya.    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 Outpatient Encounter Prescriptions as of 10/24/2016  Medication Sig  . amLODipine (NORVASC) 10 MG tablet Take 1 tablet (10 mg total) by mouth daily.  . benzonatate (TESSALON) 100 MG capsule Take 1 capsule (100 mg total) by mouth every 8 (eight) hours.  [name] doxycycline (VIBRAMYCIN) 100 MG capsule Take 1 capsule (100 mg total) by mouth 2 (two) times daily.  [name] elvitegravir-cobicistat-emtricitabine-tenofovir (GENVOYA) 150-150-200-10 MG TABS tablet Take 1 tablet by mouth daily with breakfast.  . ENSURE (ENSURE) Take 237 mLs by mouth 3 (three) times daily between meals.  [name] guaiFENesin (MUCINEX) 600 MG 12 hr tablet Take 600 mg by mouth 2 (two) times daily as needed for cough or to loosen phlegm.  [name] HYDROcodone-acetaminophen (NORCO/VICODIN) 5-325 MG tablet Take 1-2 tablets by mouth every 6 (six) hours as needed.  . hydrocortisone (ANUSOL-HC) 25 MG suppository Place 1 suppository (25 mg total) rectally 2 (two) times daily.  . hydrocortisone cream 1 % Apply to affected area 3-4 times daily (Patient taking differently: Apply 1 application topically daily as needed for itching. Apply to affected area 3-4 times daily)  . omeprazole (PRILOSEC) 20 MG capsule TAKE 1 CAPSULE BY MOUTH EVERY DAY  . polyethylene glycol (MIRALAX / GLYCOLAX) packet Take one capful in 8 oz of fluid of your choice twice a day until having soft stools, then back down to once a day.  If not having soft stools with twice a day, increased to three times a day  . pramoxine (PROCTOFOAM) 1 % foam Place 1 application rectally 3 (three) times daily as needed for itching.  . pravastatin (PRAVACHOL) 20 MG tablet  Take 1 tablet (20 mg total) by mouth daily.  . traMADol (ULTRAM) 50 MG tablet Take 1 tablet (50 mg total) by mouth every 12 (twelve) hours.  Janice Williams (TUCKS) 50 % PADS Use as needed for rectal itching, after having BM (Patient not taking: Reported on 05/18/2016)   No facility-administered encounter medications on file as of 10/24/2016.      Patient Active Problem List   Diagnosis Date Noted  . Dental abscess 03/15/2015  . Knee pain, bilateral 03/15/2015  . Acne cystica 12/22/2011  . Dyslipidemia 07/06/2011  . Routine health maintenance 07/06/2011  . EPIDERMOID CYST 02/10/2011  . ACUTE SINUSITIS, UNSPECIFIED 01/20/2010  . EXTERNAL OTITIS 01/06/2010  . HYPERLIPIDEMIA 06/03/2009  . WEIGHT LOSS 06/03/2009  . CARBUNCLE AND FURUNCLE OF FACE 02/04/2009  . FACIAL RASH 02/04/2009  . Human immunodeficiency virus (HIV) disease ([place]) 10/29/2008  . ANEMIA-NOS 10/29/2008  . PERIPHERAL NEUROPATHY 10/29/2008  . GERD 10/29/2008  . RENAL INSUFFICIENCY 10/29/2008  . CEREBROVASCULAR ACCIDENT, HX OF 10/29/2008  . TOBACCO DEPENDENCE 02/14/2007  . CVA 02/14/2007  . RHINITIS, ALLERGIC 02/14/2007  . DYSPEPSIA 02/14/2007  . ACNE 02/14/2007  . WEIGHT LOSS, ABNORMAL 02/14/2007     Health Maintenance Due  Topic Date Due  . COLONOSCOPY  08/28/2010     Review of Systems As mentioned above Physical Exam   There were no vitals taken for this visit. Physical Exam  Constitutional:  oriented to person, place, and time. appears well-developed and well-nourished.  No distress.  HENT: [place]/AT, PERRLA, no scleral icterus Mouth/Throat: Oropharynx is clear and moist. No oropharyngeal exudate.  Cardiovascular: Normal rate, regular rhythm and normal heart sounds. Exam reveals no gallop and no friction rub.  No murmur heard.  Pulmonary/Chest: Effort normal and breath sounds normal. No respiratory distress.  has no wheezes.  Neck = supple, no nuchal rigidity Abdominal: Soft. Bowel sounds are normal.  exhibits  no distension. There is no tenderness.  Lymphadenopathy: no cervical adenopathy. No axillary adenopathy Neurological: alert and oriented to person, place, and time.  Skin: Skin is warm and dry. No rash noted. No erythema.  Psychiatric: a normal mood and affect.  behavior is normal.   Lab Results  Component Value Date   CD4TCELL 35 05/08/2016   Lab Results  Component Value Date   CD4TABS 620 05/08/2016   CD4TABS 820 01/25/2016   CD4TABS 750 10/05/2015   Lab Results  Component Value Date   HIV1RNAQUANT <20 05/08/2016   Lab Results  Component Value Date   HEPBSAB NEG 12/28/2014   No results found for: RPR  CBC Lab Results  Component Value Date   WBC 4.0 05/08/2016   RBC 3.45 (L) 05/08/2016   HGB 11.2 (L) 05/08/2016   HCT 34.2 (L) 05/08/2016   PLT 411 (H) 05/08/2016   MCV 99.1 05/08/2016   MCH 32.5 05/08/2016   MCHC 32.7 05/08/2016   RDW 15.4 (H) 05/08/2016   LYMPHSABS 1,640 05/08/2016   MONOABS 280 05/08/2016   EOSABS 40 05/08/2016   BASOSABS 40 05/08/2016   BMET Lab Results  Component Value Date   NA 137 05/08/2016   K 4.6 05/08/2016   CL 101 05/08/2016   CO2 28 05/08/2016   GLUCOSE 109 (H) 05/08/2016   BUN 21 05/08/2016   CREATININE 0.98 05/08/2016   CALCIUM 9.4 05/08/2016   GFRNONAA 65 05/08/2016   GFRAA 75 05/08/2016     Assessment and Plan  hiv disease= well controlled. Continue on curretnt regimen  Health maintenance = will need mammo soon

## 2016-10-26 ENCOUNTER — Other Ambulatory Visit: Payer: Self-pay | Admitting: Internal Medicine

## 2016-10-26 DIAGNOSIS — B20 Human immunodeficiency virus [HIV] disease: Secondary | ICD-10-CM

## 2016-10-26 DIAGNOSIS — E785 Hyperlipidemia, unspecified: Secondary | ICD-10-CM

## 2016-11-18 ENCOUNTER — Emergency Department (HOSPITAL_COMMUNITY)
Admission: EM | Admit: 2016-11-18 | Discharge: 2016-11-19 | Disposition: A | Discharge status: Home / Self Care | Payer: Medicaid Other | Attending: Emergency Medicine | Admitting: Emergency Medicine

## 2016-11-18 ENCOUNTER — Encounter (HOSPITAL_COMMUNITY): Payer: Self-pay | Admitting: *Deleted

## 2016-11-18 DIAGNOSIS — L0201 Cutaneous abscess of face: Secondary | ICD-10-CM | POA: Diagnosis not present

## 2016-11-18 DIAGNOSIS — I10 Essential (primary) hypertension: Secondary | ICD-10-CM | POA: Diagnosis not present

## 2016-11-18 DIAGNOSIS — Z87891 Personal history of nicotine dependence: Secondary | ICD-10-CM | POA: Insufficient documentation

## 2016-11-18 NOTE — ED Triage Notes (Signed)
Patient stated she noticed an abscess to the left cheek 2 days ago

## 2016-11-19 MED ORDER — OXYCODONE-ACETAMINOPHEN 5-325 MG PO TABS
1.0000 | ORAL_TABLET | Freq: Once | ORAL | Status: AC
  Administered 2016-11-19: 1 via ORAL
  Filled 2016-11-19: qty 1

## 2016-11-19 MED ORDER — LIDOCAINE-EPINEPHRINE (PF) 2 %-1:200000 IJ SOLN
10.0000 mL | Freq: Once | INTRAMUSCULAR | Status: AC
  Administered 2016-11-19: 10 mL via INTRADERMAL
  Filled 2016-11-19: qty 20

## 2016-11-19 MED ORDER — OXYCODONE-ACETAMINOPHEN 5-325 MG PO TABS: 1.0000 | ORAL_TABLET | Freq: Four times a day (QID) | ORAL | 0 refills | Status: DC | PRN

## 2016-11-19 MED ORDER — CLINDAMYCIN HCL 150 MG PO CAPS: 300.0000 mg | ORAL_CAPSULE | Freq: Three times a day (TID) | ORAL | 0 refills | Status: DC

## 2016-11-19 MED ORDER — CLINDAMYCIN HCL 150 MG PO CAPS
300.0000 mg | ORAL_CAPSULE | Freq: Once | ORAL | Status: AC
  Administered 2016-11-19: 300 mg via ORAL
  Filled 2016-11-19: qty 2

## 2016-11-19 NOTE — ED Provider Notes (Signed)
[facility] DEPT Provider Note   CSN: [id number] Arrival date & time: 11/18/16  2206     History   Chief Complaint Chief Complaint  Patient presents with  . Abscess    HPI Janice Williams is a 55 y.o. female.  Patient with history of HIV, recurrent abscesses, HTN presents with painful swollen area to her left cheek c/w previous abscesses. No drainage or fever. It does not affect her eye - no pain with movement of the eye.    The history is provided by the patient. No language interpreter was used.  Abscess  Associated symptoms: no fever and no headaches     Past Medical History:  Diagnosis Date  . HIV infection ([place])   . Hypertension   . Substance abuse    history, clean 7 years    Patient Active Problem List   Diagnosis Date Noted  . Dental abscess 03/15/2015  . Knee pain, bilateral 03/15/2015  . Acne cystica 12/22/2011  . Dyslipidemia 07/06/2011  . Routine health maintenance 07/06/2011  . EPIDERMOID CYST 02/10/2011  . ACUTE SINUSITIS, UNSPECIFIED 01/20/2010  . EXTERNAL OTITIS 01/06/2010  . HYPERLIPIDEMIA 06/03/2009  . WEIGHT LOSS 06/03/2009  . CARBUNCLE AND FURUNCLE OF FACE 02/04/2009  . FACIAL RASH 02/04/2009  . Human immunodeficiency virus (HIV) disease ([place]) 10/29/2008  . ANEMIA-NOS 10/29/2008  . PERIPHERAL NEUROPATHY 10/29/2008  . GERD 10/29/2008  . RENAL INSUFFICIENCY 10/29/2008  . CEREBROVASCULAR ACCIDENT, HX OF 10/29/2008  . TOBACCO DEPENDENCE 02/14/2007  . CVA 02/14/2007  . RHINITIS, ALLERGIC 02/14/2007  . DYSPEPSIA 02/14/2007  . ACNE 02/14/2007  . WEIGHT LOSS, ABNORMAL 02/14/2007    History reviewed. No pertinent surgical history.  OB History    No data available       Home Medications    Prior to Admission medications   Medication Sig Start Date End Date Taking? Authorizing Provider  amLODipine (NORVASC) 10 MG tablet Take 1 tablet (10 mg total) by mouth daily. 04/24/16   Carlyle Basques, MD  benzonatate (TESSALON) 100 MG capsule  Take 1 capsule (100 mg total) by mouth every 8 (eight) hours. 04/23/16   Shawn C Joy, PA-C  doxycycline (VIBRAMYCIN) 100 MG capsule Take 1 capsule (100 mg total) by mouth 2 (two) times daily. 10/19/16   Montine Circle, PA-C  ENSURE (ENSURE) Take 237 mLs by mouth 3 (three) times daily between meals. 09/19/16   Carlyle Basques, MD  GENVOYA 150-150-200-10 MG TABS tablet TAKE 1 TABLET BY MOUTH DAILY WITH BREAKFAST. 10/26/16   Carlyle Basques, MD  guaiFENesin (MUCINEX) 600 MG 12 hr tablet Take 600 mg by mouth 2 (two) times daily as needed for cough or to loosen phlegm.    Historical Provider, MD  HYDROcodone-acetaminophen (NORCO/VICODIN) 5-325 MG tablet Take 1-2 tablets by mouth every 6 (six) hours as needed. 10/19/16   Montine Circle, PA-C  hydrocortisone (ANUSOL-HC) 25 MG suppository Place 1 suppository (25 mg total) rectally 2 (two) times daily. 07/28/16   Carlyle Basques, MD  hydrocortisone cream 1 % Apply to affected area 3-4 times daily Patient taking differently: Apply 1 application topically daily as needed for itching. Apply to affected area 3-4 times daily 03/28/16   Frederica Kuster, PA-C  omeprazole (PRILOSEC) 20 MG capsule TAKE 1 CAPSULE BY MOUTH EVERY DAY 08/08/16   Carlyle Basques, MD  polyethylene glycol [facility] / GLYCOLAX) packet Take one capful in 8 oz of fluid of your choice twice a day until having soft stools, then back down to once a day.  If not having soft stools with twice a day, increased to three times a day 07/21/15   Linton Flemings, MD  pramoxine (PROCTOFOAM) 1 % foam Place 1 application rectally 3 (three) times daily as needed for itching. 05/18/16   Carlyle Basques, MD  pravastatin (PRAVACHOL) 20 MG tablet TAKE 1 TABLET BY MOUTH EVERY DAY 10/26/16   Carlyle Basques, MD  traMADol (ULTRAM) 50 MG tablet Take 1 tablet (50 mg total) by mouth every 12 (twelve) hours. 10/13/16   Carlyle Basques, MD  Witch Hazel (TUCKS) 50 % PADS Use as needed for rectal itching, after having BM 07/21/15   Linton Flemings, MD     Family History No family history on file.  Social History Social History  Substance Use Topics  . Smoking status: Former Smoker    Packs/day: 0.40    Types: Cigarettes    Start date: 12/18/2012  . Smokeless tobacco: Never Used  . Alcohol use 0.0 oz/week     Comment: "occasional"      Allergies   Chantix [varenicline] and Penicillins   Review of Systems Review of Systems  Constitutional: Negative for fever.  HENT: Positive for facial swelling. Negative for trouble swallowing.   Eyes: Negative for pain.  Skin: Positive for wound.       See HPI.  Neurological: Negative for headaches.     Physical Exam Updated Vital Signs BP 130/83 (BP Location: Left Arm)   Pulse 102   Temp 98.2 F (36.8 C) (Oral)   Resp 18   Ht 5\' 3"  (1.6 m)   Wt 47.6 kg   SpO2 100%   BMI 18.60 kg/m   Physical Exam  Constitutional: She is oriented to person, place, and time. She appears well-developed and well-nourished.  HENT:  Significantly tender, pointing, fluctuant area to center of left cheek with induration that extends to a diameter of approximately 5 cm.   Neck: Normal range of motion.  Pulmonary/Chest: Effort normal.  Musculoskeletal: Normal range of motion.  Neurological: She is alert and oriented to person, place, and time.  Skin: Skin is warm and dry.     ED Treatments / Results  Labs (all labs ordered are listed, but only abnormal results are displayed) Labs Reviewed - No data to display  EKG  EKG Interpretation None       Radiology No results found.  Procedures Procedures (including critical care time) INCISION AND DRAINAGE Performed by: Charlann Lange A Consent: Verbal consent obtained. Risks and benefits: risks, benefits and alternatives were discussed Type: abscess  Body area: left cheek  Anesthesia: local infiltration  Incision was made with a scalpel.  Local anesthetic: lidocaine 2% w/epinephrine  Anesthetic total: 1 ml  Complexity:  complex Blunt dissection to break up loculations  Drainage: purulent  Drainage amount: purulent, sebaceous material, blood, malodorous  Packing material: none  Patient tolerance: Patient tolerated the procedure well with no immediate complications.    Medications Ordered in ED Medications  oxyCODONE-acetaminophen (PERCOCET/ROXICET) 5-325 MG per tablet 1 tablet (not administered)  lidocaine-EPINEPHrine (XYLOCAINE W/EPI) 2 %-1:200000 (PF) injection 10 mL (not administered)     Initial Impression / Assessment and Plan / ED Course  I have reviewed the triage vital signs and the nursing notes.  Pertinent labs & imaging results that were available during my care of the patient were reviewed by me and considered in my medical decision making (see chart for details).  Clinical Course     Facial abscess requiring I&D as per above note.  Chart reviewed. Patient with a history of HIV with low viral load and last CD4 of over 600. No fever, eye pain or other evidence deep space infection. She can be discharged home on abx with close PCP follow up. Strict return precautions discussed.   Final Clinical Impressions(s) / ED Diagnoses   Final diagnoses:  None   1. Facial abscess  New Prescriptions New Prescriptions   No medications on file     Charlann Lange, PA-C 11/19/16 0124    Gareth Morgan, MD 11/19/16 [phone]

## 2016-11-19 NOTE — Discharge Instructions (Signed)
FOLLOW UP WITH YOUR DOCTOR IN 2 DAYS FOR RECHECK OF FACIAL ABSCESS. RETURN TO THE EMERGENCY DEPARTMENT IF YOU HAVE ANY FEVER, SEVERE FACIAL PAIN, PAIN WITH EYE MOVEMENT, DIFFICULTY SWALLOWING OR NEW CONCERN. TAKE THE ANTIBIOTIC AS PRESCRIBED AND THE PAIN MEDICATION AS NEEDED.

## 2016-12-06 ENCOUNTER — Other Ambulatory Visit: Payer: Self-pay | Admitting: Internal Medicine

## 2016-12-06 ENCOUNTER — Encounter (HOSPITAL_COMMUNITY): Payer: Self-pay | Admitting: Emergency Medicine

## 2016-12-06 ENCOUNTER — Emergency Department (HOSPITAL_COMMUNITY)
Admission: EM | Admit: 2016-12-06 | Discharge: 2016-12-06 | Disposition: A | Discharge status: Home / Self Care | Payer: Medicaid Other | Attending: Emergency Medicine | Admitting: Emergency Medicine

## 2016-12-06 DIAGNOSIS — I10 Essential (primary) hypertension: Secondary | ICD-10-CM | POA: Insufficient documentation

## 2016-12-06 DIAGNOSIS — L0201 Cutaneous abscess of face: Secondary | ICD-10-CM | POA: Diagnosis not present

## 2016-12-06 DIAGNOSIS — M1612 Unilateral primary osteoarthritis, left hip: Secondary | ICD-10-CM

## 2016-12-06 DIAGNOSIS — Z87891 Personal history of nicotine dependence: Secondary | ICD-10-CM | POA: Insufficient documentation

## 2016-12-06 DIAGNOSIS — L0291 Cutaneous abscess, unspecified: Secondary | ICD-10-CM

## 2016-12-06 DIAGNOSIS — Z8673 Personal history of transient ischemic attack (TIA), and cerebral infarction without residual deficits: Secondary | ICD-10-CM | POA: Insufficient documentation

## 2016-12-06 HISTORY — DX: Cerebral infarction, unspecified: I63.9

## 2016-12-06 HISTORY — DX: Unspecified osteoarthritis, unspecified site: M19.90

## 2016-12-06 MED ORDER — LIDOCAINE-EPINEPHRINE (PF) 2 %-1:200000 IJ SOLN
10.0000 mL | Freq: Once | INTRAMUSCULAR | Status: AC
  Administered 2016-12-06: 2 mL
  Filled 2016-12-06: qty 20

## 2016-12-06 MED ORDER — CLINDAMYCIN HCL 150 MG PO CAPS: 300.0000 mg | ORAL_CAPSULE | Freq: Three times a day (TID) | ORAL | 0 refills | Status: DC

## 2016-12-06 NOTE — ED Triage Notes (Signed)
Pt c/o recurrent swelling on l/side of face. Treated for abscess two weeks ago. Pt feels that large swollen on face area has increased since treatment. Area continues to drain clear fluid.
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
## 2016-12-06 NOTE — Discharge Instructions (Signed)
Take your antibiotic as prescribed until completed. I also recommend continuing to apply warm compresses to the area 3-4 times daily for 15-20 minutes.  Follow up with the dermatology clinic listed below for further management of your recurrent abscesses. Please return to the Emergency Department if symptoms worsen or new onset of fever, swelling, redness, difficulty opening your mouth, difficulty breathing, difficulty swallowing, vomiting.

## 2016-12-07 ENCOUNTER — Telehealth: Payer: Self-pay | Admitting: *Deleted

## 2016-12-07 NOTE — Telephone Encounter (Signed)
Rx for tramadol called into [facility]. Patient notified. Myrtis Hopping

## 2016-12-07 NOTE — Telephone Encounter (Signed)
Per patient she discussed this Rx with Dr. Baxter Flattery at last visit and ok for ongoing refills. Explained that we can only call in two refills and we then get a prompt from the pharmacy. Advised I would put in a note stating she had a conversation with MD. There is a note about Tramadol in last visit. Janice Williams

## 2016-12-12 LAB — AEROBIC CULTURE W GRAM STAIN (SUPERFICIAL SPECIMEN)

## 2016-12-19 ENCOUNTER — Other Ambulatory Visit: Payer: Self-pay | Admitting: Internal Medicine

## 2016-12-19 DIAGNOSIS — K2100 Gastro-esophageal reflux disease with esophagitis, without bleeding: Secondary | ICD-10-CM

## 2017-01-11 ENCOUNTER — Other Ambulatory Visit: Payer: Medicaid Other

## 2017-01-11 ENCOUNTER — Other Ambulatory Visit: Payer: Self-pay | Admitting: *Deleted

## 2017-01-11 DIAGNOSIS — R634 Abnormal weight loss: Secondary | ICD-10-CM

## 2017-01-11 MED ORDER — ENSURE PO LIQD: 237.0000 mL | Freq: Three times a day (TID) | ORAL | 5 refills | Status: DC

## 2017-01-25 ENCOUNTER — Telehealth: Payer: Self-pay | Admitting: *Deleted

## 2017-01-25 ENCOUNTER — Ambulatory Visit (INDEPENDENT_AMBULATORY_CARE_PROVIDER_SITE_OTHER): Payer: Medicaid Other | Admitting: Internal Medicine

## 2017-01-25 ENCOUNTER — Other Ambulatory Visit: Payer: Self-pay | Admitting: *Deleted

## 2017-01-25 ENCOUNTER — Other Ambulatory Visit: Payer: Self-pay | Admitting: Internal Medicine

## 2017-01-25 ENCOUNTER — Encounter: Payer: Self-pay | Admitting: Internal Medicine

## 2017-01-25 VITALS — BP 124/82 | HR 71 | Temp 97.6°F | Ht 64.0 in | Wt 93.0 lb

## 2017-01-25 DIAGNOSIS — L700 Acne vulgaris: Secondary | ICD-10-CM

## 2017-01-25 DIAGNOSIS — M810 Age-related osteoporosis without current pathological fracture: Secondary | ICD-10-CM

## 2017-01-25 DIAGNOSIS — E559 Vitamin D deficiency, unspecified: Secondary | ICD-10-CM

## 2017-01-25 DIAGNOSIS — R634 Abnormal weight loss: Secondary | ICD-10-CM

## 2017-01-25 DIAGNOSIS — B20 Human immunodeficiency virus [HIV] disease: Secondary | ICD-10-CM

## 2017-01-25 LAB — COMPLETE METABOLIC PANEL WITH GFR
ALT: 11 U/L (ref 6–29)
AST: 20 U/L (ref 10–35)
Albumin: 4.4 g/dL (ref 3.6–5.1)
Alkaline Phosphatase: 43 U/L (ref 33–130)
BUN: 15 mg/dL (ref 7–25)
CO2: 28 mmol/L (ref 20–31)
Calcium: 9.3 mg/dL (ref 8.6–10.4)
Chloride: 104 mmol/L (ref 98–110)
Creat: 0.93 mg/dL (ref 0.50–1.05)
GFR, Est African American: 79 mL/min (ref >=60)
GFR, Est Non African American: 69 mL/min (ref >=60)
Glucose, Bld: 80 mg/dL (ref 65–99)
Potassium: 4.3 mmol/L (ref 3.5–5.3)
Sodium: 139 mmol/L (ref 135–146)
Total Bilirubin: 0.4 mg/dL (ref 0.2–1.2)
Total Protein: 7.3 g/dL (ref 6.1–8.1)

## 2017-01-25 LAB — CBC WITH DIFFERENTIAL/PLATELET
Basophils Absolute: 37 cells/uL (ref 0–200)
Basophils Relative: 1 %
Eosinophils Absolute: 37 cells/uL (ref 15–500)
Eosinophils Relative: 1 %
HCT: 34.4 % — ABNORMAL LOW (ref 35.0–45.0)
Hemoglobin: 11.6 g/dL — ABNORMAL LOW (ref 11.7–15.5)
Lymphocytes Relative: 57 %
Lymphs Abs: 2109 cells/uL (ref 850–3900)
MCH: 33.5 pg — ABNORMAL HIGH (ref 27.0–33.0)
MCHC: 33.7 g/dL (ref 32.0–36.0)
MCV: 99.4 fL (ref 80.0–100.0)
MPV: 9.4 fL (ref 7.5–12.5)
Monocytes Absolute: 222 cells/uL (ref 200–950)
Monocytes Relative: 6 %
Neutro Abs: 1295 cells/uL — ABNORMAL LOW (ref 1500–7800)
Neutrophils Relative %: 35 %
Platelets: 291 10*3/uL (ref 140–400)
RBC: 3.46 MIL/uL — ABNORMAL LOW (ref 3.80–5.10)
RDW: 14.3 % (ref 11.0–15.0)
WBC: 3.7 10*3/uL — ABNORMAL LOW (ref 3.8–10.8)

## 2017-01-25 LAB — TSH: TSH: 1.01 mIU/L

## 2017-01-25 LAB — LIPID PANEL
Cholesterol: 287 mg/dL — ABNORMAL HIGH (ref <200)
HDL: 126 mg/dL (ref >50)
LDL Cholesterol: 149 mg/dL — ABNORMAL HIGH (ref <100)
Total CHOL/HDL Ratio: 2.3 Ratio (ref <5.0)
Triglycerides: 58 mg/dL (ref <150)
VLDL: 12 mg/dL (ref <30)

## 2017-01-25 LAB — T4, FREE: Free T4: 1.1 ng/dL (ref 0.8–1.8)

## 2017-01-25 MED ORDER — ALENDRONATE SODIUM 70 MG PO TABS: 70.0000 mg | ORAL_TABLET | ORAL | 1 refills | Status: DC

## 2017-01-25 MED ORDER — ENSURE PO LIQD: 237.0000 mL | Freq: Three times a day (TID) | ORAL | 5 refills | Status: DC

## 2017-01-25 MED ORDER — CALCIUM CARBONATE-VITAMIN D 500-200 MG-UNIT PO TABS: 1.0000 | ORAL_TABLET | Freq: Two times a day (BID) | ORAL | 11 refills | Status: DC
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
## 2017-01-25 NOTE — Telephone Encounter (Signed)
Patient asked about her tramadol refill for next month as she was leaving the clinic. Per Dr. Baxter Flattery she will need to come in and fill out a contract and have a urine drug screen. Notified patient that she needs to call when needed and to fill out the contract.

## 2017-01-26 ENCOUNTER — Other Ambulatory Visit: Payer: Self-pay | Admitting: Internal Medicine

## 2017-01-26 LAB — VITAMIN D 25 HYDROXY (VIT D DEFICIENCY, FRACTURES): Vit D, 25-Hydroxy: 17 ng/mL — ABNORMAL LOW (ref 30–100)

## 2017-01-26 LAB — T-HELPER CELL (CD4) - (RCID CLINIC ONLY)
CD4 % Helper T Cell: 40 % (ref 33–55)
CD4 T Cell Abs: 910 /uL (ref 400–2700)

## 2017-01-26 MED ORDER — ERGOCALCIFEROL 1.25 MG (50000 UT) PO CAPS: 50000.0000 [IU] | ORAL_CAPSULE | ORAL | 0 refills | Status: DC

## 2017-01-26 NOTE — Progress Notes (Signed)
Sent in rx for vitamin d deficiency, ergocalciferol 50,000 q week. Will have her do this for 2 months before starting fosamax.

## 2017-01-29 LAB — HIV-1 RNA QUANT-NO REFLEX-BLD
HIV 1 RNA Quant: 46 copies/mL — ABNORMAL HIGH
HIV-1 RNA Quant, Log: 1.66 Log copies/mL — ABNORMAL HIGH

## 2017-03-08 ENCOUNTER — Other Ambulatory Visit: Payer: Medicaid Other

## 2017-03-08 DIAGNOSIS — B20 Human immunodeficiency virus [HIV] disease: Secondary | ICD-10-CM

## 2017-03-08 DIAGNOSIS — G8929 Other chronic pain: Secondary | ICD-10-CM

## 2017-03-08 NOTE — Telephone Encounter (Signed)
Patient signed Contract and went to Lab 03/08/17.  Paperwork placed in Triage folder.

## 2017-03-09 LAB — PAIN MGMT, PROFILE 1 W/O CONF, U
Amphetamines: NEGATIVE ng/mL (ref <500)
Barbiturates: NEGATIVE ng/mL (ref <300)
Benzodiazepines: NEGATIVE ng/mL (ref <100)
Cocaine Metabolite: NEGATIVE ng/mL (ref <150)
Creatinine: 163.3 mg/dL (ref >=20.0)
Marijuana Metabolite: NEGATIVE ng/mL (ref <20)
Methadone Metabolite: NEGATIVE ng/mL (ref <100)
Opiates: NEGATIVE ng/mL (ref <100)
Oxidant: NEGATIVE ug/mL (ref <200)
Oxycodone: NEGATIVE ng/mL (ref <100)
Phencyclidine: NEGATIVE ng/mL (ref <25)
pH: 6.13 (ref 4.5–9.0)

## 2017-03-11 ENCOUNTER — Other Ambulatory Visit: Payer: Self-pay | Admitting: Internal Medicine

## 2017-03-11 DIAGNOSIS — M1612 Unilateral primary osteoarthritis, left hip: Secondary | ICD-10-CM

## 2017-04-09 ENCOUNTER — Other Ambulatory Visit: Payer: Medicaid Other

## 2017-04-09 DIAGNOSIS — Z113 Encounter for screening for infections with a predominantly sexual mode of transmission: Secondary | ICD-10-CM

## 2017-04-09 DIAGNOSIS — G8929 Other chronic pain: Secondary | ICD-10-CM

## 2017-04-09 DIAGNOSIS — B20 Human immunodeficiency virus [HIV] disease: Secondary | ICD-10-CM

## 2017-04-09 LAB — COMPREHENSIVE METABOLIC PANEL
ALT: 9 U/L (ref 6–29)
AST: 18 U/L (ref 10–35)
Albumin: 4.4 g/dL (ref 3.6–5.1)
Alkaline Phosphatase: 44 U/L (ref 33–130)
BUN: 17 mg/dL (ref 7–25)
CO2: 27 mmol/L (ref 20–31)
Calcium: 10 mg/dL (ref 8.6–10.4)
Chloride: 104 mmol/L (ref 98–110)
Creat: 1.07 mg/dL — ABNORMAL HIGH (ref 0.50–1.05)
Glucose, Bld: 103 mg/dL — ABNORMAL HIGH (ref 65–99)
Potassium: 4.5 mmol/L (ref 3.5–5.3)
Sodium: 141 mmol/L (ref 135–146)
Total Bilirubin: 0.3 mg/dL (ref 0.2–1.2)
Total Protein: 7.5 g/dL (ref 6.1–8.1)

## 2017-04-09 LAB — CBC
HCT: 35.9 % (ref 35.0–45.0)
Hemoglobin: 11.9 g/dL (ref 11.7–15.5)
MCH: 32.6 pg (ref 27.0–33.0)
MCHC: 33.1 g/dL (ref 32.0–36.0)
MCV: 98.4 fL (ref 80.0–100.0)
MPV: 9.5 fL (ref 7.5–12.5)
Platelets: 261 10*3/uL (ref 140–400)
RBC: 3.65 MIL/uL — ABNORMAL LOW (ref 3.80–5.10)
RDW: 13.8 % (ref 11.0–15.0)
WBC: 3.7 10*3/uL — ABNORMAL LOW (ref 3.8–10.8)

## 2017-04-10 LAB — PAIN MGMT, PROFILE 1 W/O CONF, U
Amphetamines: NEGATIVE ng/mL
Barbiturates: NEGATIVE ng/mL
Benzodiazepines: NEGATIVE ng/mL
Cocaine Metabolite: NEGATIVE ng/mL
Creatinine: 45.2 mg/dL
Marijuana Metabolite: POSITIVE ng/mL — AB
Methadone Metabolite: NEGATIVE ng/mL
Opiates: NEGATIVE ng/mL
Oxidant: NEGATIVE ug/mL
Oxycodone: NEGATIVE ng/mL
Phencyclidine: NEGATIVE ng/mL
pH: 6.22

## 2017-04-10 LAB — T-HELPER CELL (CD4) - (RCID CLINIC ONLY)
CD4 % Helper T Cell: 38 % (ref 33–55)
CD4 T Cell Abs: 790 /uL (ref 400–2700)

## 2017-04-10 LAB — SYPHILIS: RPR W/REFLEX TO RPR TITER AND TREPONEMAL ANTIBODIES, TRADITIONAL SCREENING AND DIAGNOSIS ALGORITHM

## 2017-04-11 LAB — HIV-1 RNA QUANT-NO REFLEX-BLD
HIV 1 RNA Quant: <20 copies/mL — AB
HIV-1 RNA Quant, Log: <1.3 Log copies/mL — AB

## 2017-04-24 ENCOUNTER — Other Ambulatory Visit: Payer: Self-pay | Admitting: Internal Medicine

## 2017-04-24 ENCOUNTER — Ambulatory Visit (INDEPENDENT_AMBULATORY_CARE_PROVIDER_SITE_OTHER): Payer: Medicaid Other | Admitting: Internal Medicine

## 2017-04-24 ENCOUNTER — Encounter: Payer: Self-pay | Admitting: Internal Medicine

## 2017-04-24 VITALS — BP 112/72 | HR 58 | Temp 98.0°F | Wt 92.4 lb

## 2017-04-24 DIAGNOSIS — E43 Unspecified severe protein-calorie malnutrition: Secondary | ICD-10-CM | POA: Diagnosis not present

## 2017-04-24 DIAGNOSIS — E785 Hyperlipidemia, unspecified: Secondary | ICD-10-CM

## 2017-04-24 DIAGNOSIS — B20 Human immunodeficiency virus [HIV] disease: Secondary | ICD-10-CM | POA: Diagnosis not present

## 2017-04-24 DIAGNOSIS — R634 Abnormal weight loss: Secondary | ICD-10-CM

## 2017-04-24 DIAGNOSIS — M1612 Unilateral primary osteoarthritis, left hip: Secondary | ICD-10-CM

## 2017-04-24 MED ORDER — DRONABINOL 5 MG PO CAPS: 5.0000 mg | ORAL_CAPSULE | Freq: Two times a day (BID) | ORAL | 3 refills | Status: DC

## 2017-04-24 MED ORDER — VITAMIN D (ERGOCALCIFEROL) 1.25 MG (50000 UNIT) PO CAPS: 50000.0000 [IU] | ORAL_CAPSULE | ORAL | 0 refills | Status: DC

## 2017-04-24 MED ORDER — TRAMADOL HCL 50 MG PO TABS: 50.0000 mg | ORAL_TABLET | Freq: Two times a day (BID) | ORAL | 2 refills | Status: DC
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
## 2017-04-27 ENCOUNTER — Telehealth: Payer: Self-pay | Admitting: *Deleted

## 2017-04-27 NOTE — Telephone Encounter (Signed)
PA received for Marinol, completed and faxed to Medicaid.

## 2017-05-03 NOTE — Telephone Encounter (Signed)
PA approved - Pharmacy to notify the patient.

## 2017-05-28 ENCOUNTER — Other Ambulatory Visit: Payer: Self-pay | Admitting: Internal Medicine

## 2017-05-28 DIAGNOSIS — Z1231 Encounter for screening mammogram for malignant neoplasm of breast: Secondary | ICD-10-CM

## 2017-06-07 ENCOUNTER — Other Ambulatory Visit: Payer: Self-pay | Admitting: Pharmacist

## 2017-06-07 ENCOUNTER — Telehealth: Payer: Self-pay | Admitting: Pharmacist

## 2017-06-07 ENCOUNTER — Telehealth: Payer: Self-pay | Admitting: *Deleted

## 2017-06-07 DIAGNOSIS — J302 Other seasonal allergic rhinitis: Secondary | ICD-10-CM

## 2017-06-07 MED ORDER — BECLOMETHASONE DIPROP MONOHYD 42 MCG/SPRAY NA SUSP: 1.0000 | Freq: Two times a day (BID) | NASAL | 1 refills | Status: DC

## 2017-06-07 NOTE — Telephone Encounter (Signed)
Pt called and spoke to [place], [place], and requested something for nasal/sinus congestion.  Pt on Genvoya, so Flonase is contraindicated.  Will send in Beconase nasal spray to [facility] for her.

## 2017-06-07 NOTE — Telephone Encounter (Signed)
Complaining of a "sinus flare" asking for nasal spray.  RN spoke with [facility] pharmacist Magda Kiel who will send in an appropriate nasal spray.  RN informed the patient that she will be able to pick up the rx later this afternoon.

## 2017-06-08 ENCOUNTER — Encounter (HOSPITAL_COMMUNITY): Payer: Self-pay

## 2017-06-08 ENCOUNTER — Inpatient Hospital Stay (HOSPITAL_COMMUNITY)
Admission: EM | Admit: 2017-06-08 | Discharge: 2017-06-10 | DRG: 153 | Disposition: A | Discharge status: Home / Self Care | Payer: Medicaid Other | Attending: Internal Medicine | Admitting: Internal Medicine

## 2017-06-08 ENCOUNTER — Emergency Department (HOSPITAL_COMMUNITY): Payer: Medicaid Other

## 2017-06-08 DIAGNOSIS — B20 Human immunodeficiency virus [HIV] disease: Secondary | ICD-10-CM | POA: Diagnosis not present

## 2017-06-08 DIAGNOSIS — Z681 Body mass index (BMI) 19 or less, adult: Secondary | ICD-10-CM

## 2017-06-08 DIAGNOSIS — J329 Chronic sinusitis, unspecified: Secondary | ICD-10-CM | POA: Diagnosis not present

## 2017-06-08 DIAGNOSIS — I129 Hypertensive chronic kidney disease with stage 1 through stage 4 chronic kidney disease, or unspecified chronic kidney disease: Secondary | ICD-10-CM | POA: Diagnosis present

## 2017-06-08 DIAGNOSIS — Z87891 Personal history of nicotine dependence: Secondary | ICD-10-CM

## 2017-06-08 DIAGNOSIS — I10 Essential (primary) hypertension: Secondary | ICD-10-CM | POA: Diagnosis not present

## 2017-06-08 DIAGNOSIS — E785 Hyperlipidemia, unspecified: Secondary | ICD-10-CM | POA: Diagnosis present

## 2017-06-08 DIAGNOSIS — K219 Gastro-esophageal reflux disease without esophagitis: Secondary | ICD-10-CM | POA: Diagnosis present

## 2017-06-08 DIAGNOSIS — Z21 Asymptomatic human immunodeficiency virus [HIV] infection status: Secondary | ICD-10-CM | POA: Diagnosis present

## 2017-06-08 DIAGNOSIS — Z888 Allergy status to other drugs, medicaments and biological substances status: Secondary | ICD-10-CM

## 2017-06-08 DIAGNOSIS — Z7951 Long term (current) use of inhaled steroids: Secondary | ICD-10-CM

## 2017-06-08 DIAGNOSIS — R000 Tachycardia, unspecified: Secondary | ICD-10-CM | POA: Diagnosis present

## 2017-06-08 DIAGNOSIS — Z8659 Personal history of other mental and behavioral disorders: Secondary | ICD-10-CM

## 2017-06-08 DIAGNOSIS — Z792 Long term (current) use of antibiotics: Secondary | ICD-10-CM

## 2017-06-08 DIAGNOSIS — N189 Chronic kidney disease, unspecified: Secondary | ICD-10-CM | POA: Diagnosis present

## 2017-06-08 DIAGNOSIS — J441 Chronic obstructive pulmonary disease with (acute) exacerbation: Secondary | ICD-10-CM

## 2017-06-08 DIAGNOSIS — R64 Cachexia: Secondary | ICD-10-CM | POA: Diagnosis present

## 2017-06-08 DIAGNOSIS — Z7983 Long term (current) use of bisphosphonates: Secondary | ICD-10-CM

## 2017-06-08 DIAGNOSIS — J0191 Acute recurrent sinusitis, unspecified: Principal | ICD-10-CM | POA: Diagnosis present

## 2017-06-08 DIAGNOSIS — Z79899 Other long term (current) drug therapy: Secondary | ICD-10-CM

## 2017-06-08 DIAGNOSIS — Z8673 Personal history of transient ischemic attack (TIA), and cerebral infarction without residual deficits: Secondary | ICD-10-CM

## 2017-06-08 LAB — COMPREHENSIVE METABOLIC PANEL
ALT: 6 U/L — ABNORMAL LOW (ref 14–54)
AST: 22 U/L (ref 15–41)
Albumin: 4.5 g/dL (ref 3.5–5.0)
Alkaline Phosphatase: 53 U/L (ref 38–126)
Anion gap: 9 (ref 5–15)
BUN: 11 mg/dL (ref 6–20)
CO2: 26 mmol/L (ref 22–32)
Calcium: 9.6 mg/dL (ref 8.9–10.3)
Chloride: 103 mmol/L (ref 101–111)
Creatinine, Ser: 0.94 mg/dL (ref 0.44–1.00)
GFR calc Af Amer: >60 mL/min (ref >60)
GFR calc non Af Amer: >60 mL/min (ref >60)
Glucose, Bld: 131 mg/dL — ABNORMAL HIGH (ref 65–99)
Potassium: 3.6 mmol/L (ref 3.5–5.1)
Sodium: 138 mmol/L (ref 135–145)
Total Bilirubin: 0.7 mg/dL (ref 0.3–1.2)
Total Protein: 8.1 g/dL (ref 6.5–8.1)

## 2017-06-08 LAB — CBC WITH DIFFERENTIAL/PLATELET
Basophils Absolute: 0 10*3/uL (ref 0.0–0.1)
Basophils Relative: 0 %
Eosinophils Absolute: 0.1 10*3/uL (ref 0.0–0.7)
Eosinophils Relative: 1 %
HCT: 38 % (ref 36.0–46.0)
Hemoglobin: 12.8 g/dL (ref 12.0–15.0)
Lymphocytes Relative: 24 %
Lymphs Abs: 2.3 10*3/uL (ref 0.7–4.0)
MCH: 33.1 pg (ref 26.0–34.0)
MCHC: 33.7 g/dL (ref 30.0–36.0)
MCV: 98.2 fL (ref 78.0–100.0)
Monocytes Absolute: 0.9 10*3/uL (ref 0.1–1.0)
Monocytes Relative: 10 %
Neutro Abs: 6.1 10*3/uL (ref 1.7–7.7)
Neutrophils Relative %: 65 %
Platelets: 318 10*3/uL (ref 150–400)
RBC: 3.87 MIL/uL (ref 3.87–5.11)
RDW: 14.5 % (ref 11.5–15.5)
WBC: 9.4 10*3/uL (ref 4.0–10.5)

## 2017-06-08 LAB — I-STAT TROPONIN, ED: Troponin i, poc: 0.01 ng/mL (ref 0.00–0.08)

## 2017-06-08 LAB — BRAIN NATRIURETIC PEPTIDE: B Natriuretic Peptide: 23.7 pg/mL (ref 0.0–100.0)

## 2017-06-08 IMAGING — DX DG CHEST 2V
2 series · 2 of 2 positions shown · non-contrast
Comparison: [DATE]

CLINICAL DATA: Shortness of breath with cough

EXAM:
CHEST  2 VIEW

[chest pa]
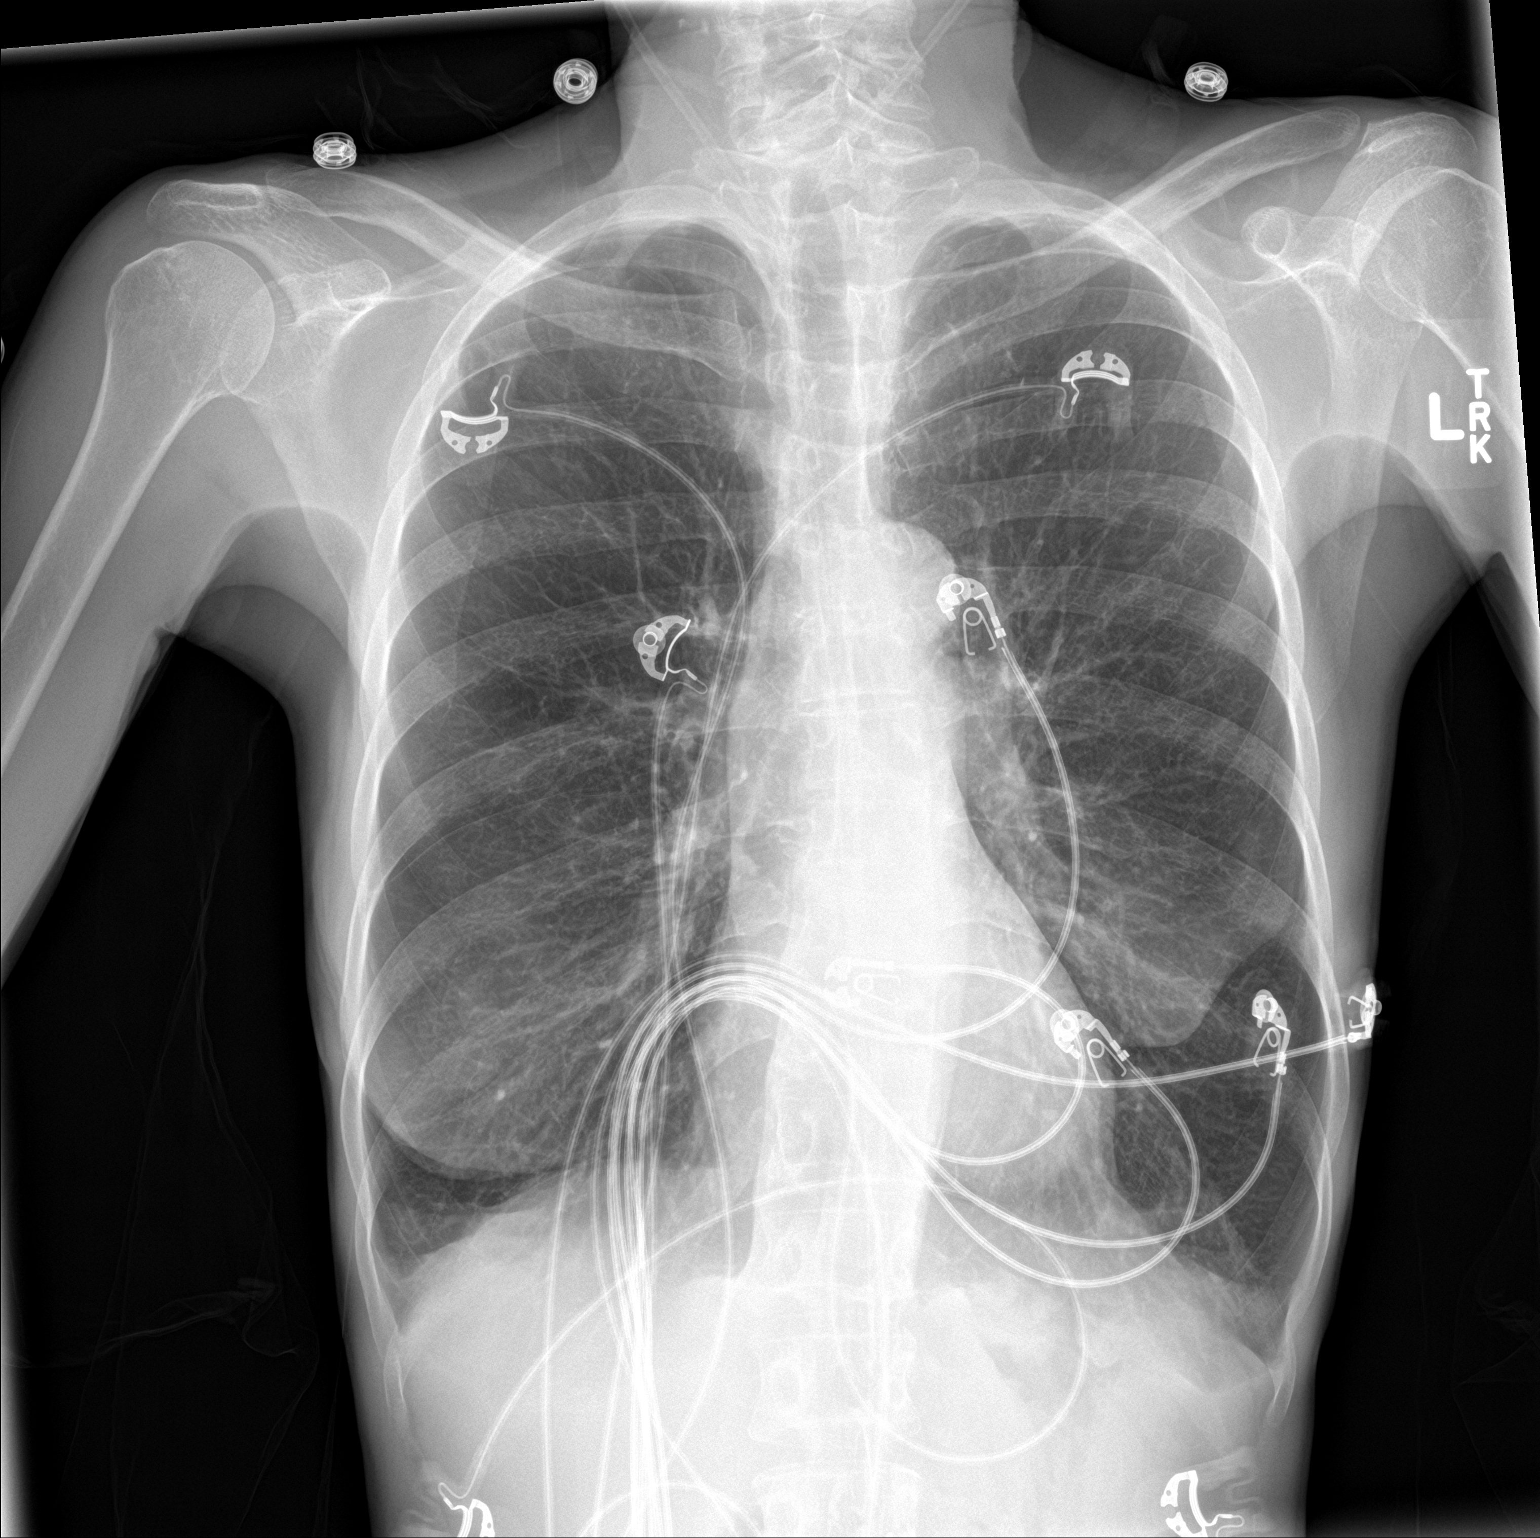

[chest lat]
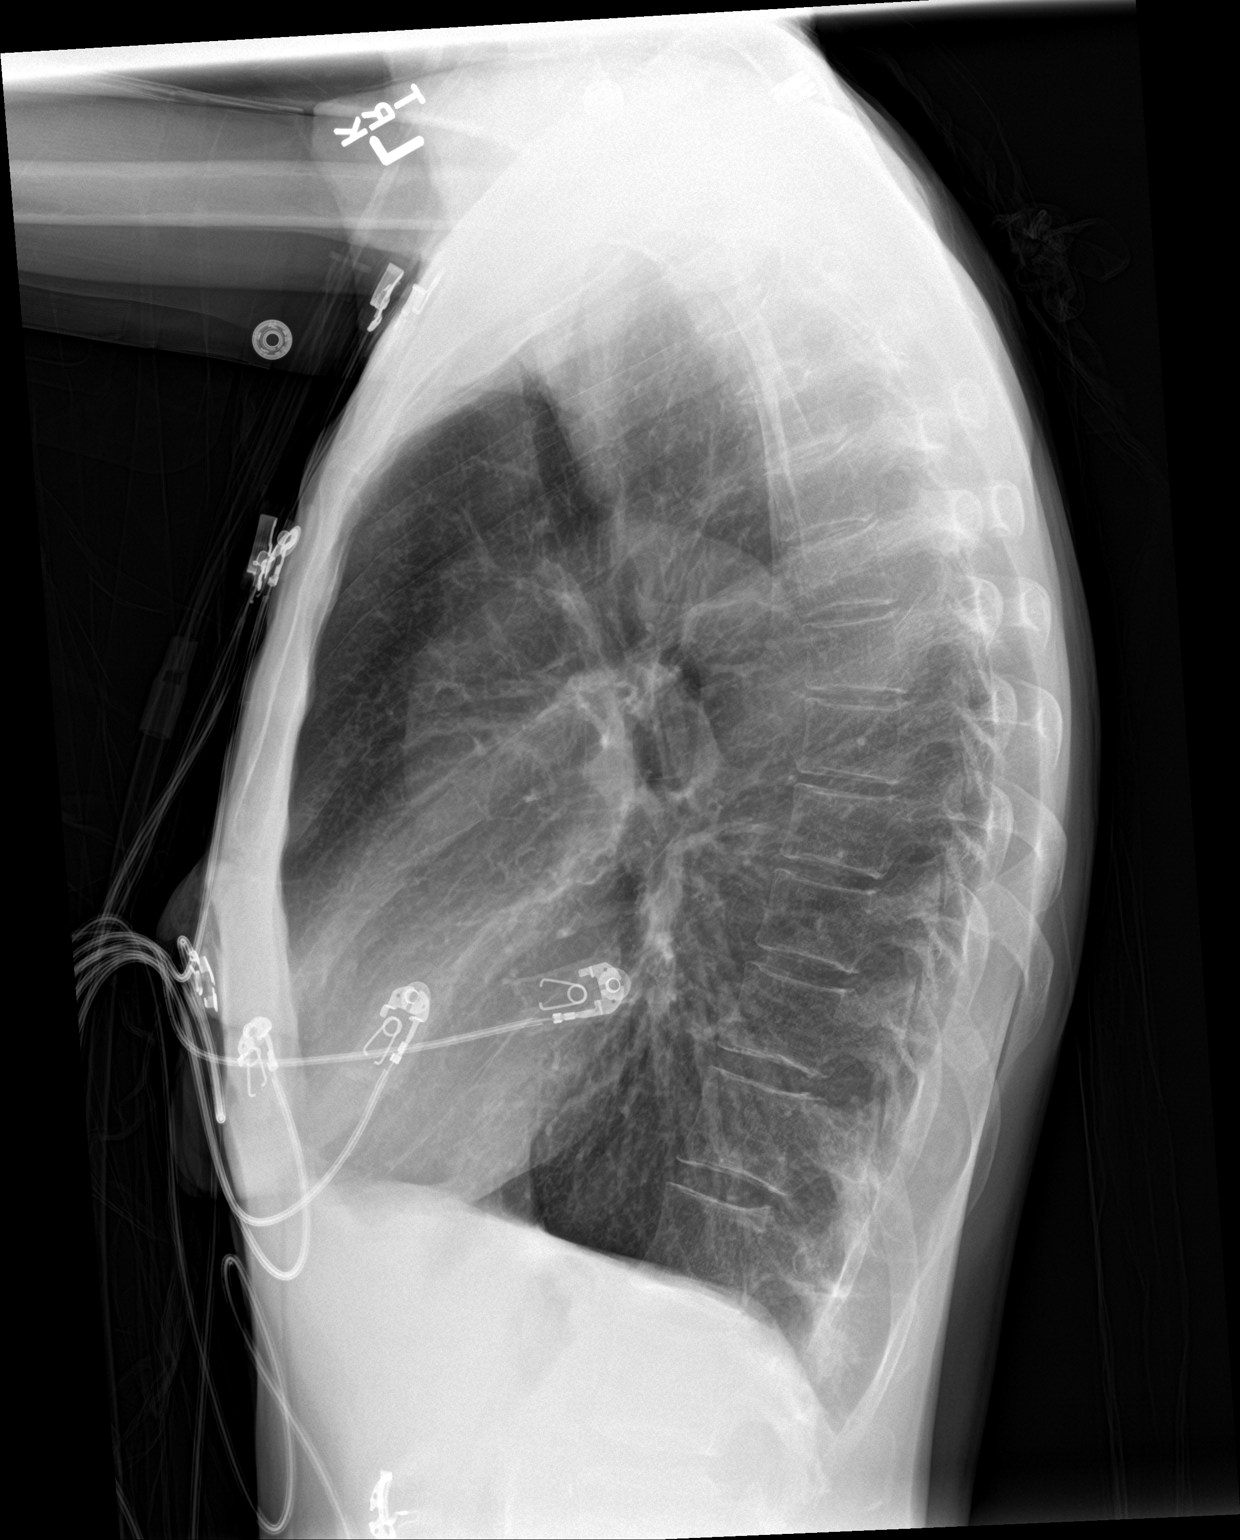

[2 of 2 positions shown; findings below may reference images not displayed]

FINDINGS: Lungs are somewhat hyperexpanded. There is no appreciable edema or
consolidation. The heart size and pulmonary vascularity are normal.
No adenopathy. There is mid thoracic levoscoliosis. Left breast
smaller than right breast, a change from prior study.
IMPRESSION: Lungs hyperexpanded. No edema or consolidation. Left breast now
smaller than right breast, a change from prior study. Question
interval surgery in this area.

## 2017-06-08 MED ORDER — POLYETHYLENE GLYCOL 3350 17 G PO PACK
17.0000 g | PACK | Freq: Every day | ORAL | Status: DC | PRN
  Administered 2017-06-08 – 2017-06-10 (×3): 17 g via ORAL
  Filled 2017-06-08 (×3): qty 1

## 2017-06-08 MED ORDER — LEVALBUTEROL HCL 0.63 MG/3ML IN NEBU
0.6300 mg | INHALATION_SOLUTION | Freq: Once | RESPIRATORY_TRACT | Status: AC
  Administered 2017-06-08: 0.63 mg via RESPIRATORY_TRACT
  Filled 2017-06-08: qty 3

## 2017-06-08 MED ORDER — ENSURE ENLIVE PO LIQD
237.0000 mL | Freq: Three times a day (TID) | ORAL | Status: DC
Start: 2017-06-08 — End: 2017-06-10
  Administered 2017-06-08 – 2017-06-10 (×4): 237 mL via ORAL

## 2017-06-08 MED ORDER — ORAL CARE MOUTH RINSE
15.0000 mL | Freq: Two times a day (BID) | OROMUCOSAL | Status: DC
  Administered 2017-06-08 – 2017-06-10 (×3): 15 mL via OROMUCOSAL

## 2017-06-08 MED ORDER — DIPHENHYDRAMINE HCL 25 MG PO TABS
25.0000 mg | ORAL_TABLET | Freq: Two times a day (BID) | ORAL | Status: DC
  Filled 2017-06-08: qty 1

## 2017-06-08 MED ORDER — DRONABINOL 5 MG PO CAPS
5.0000 mg | ORAL_CAPSULE | Freq: Two times a day (BID) | ORAL | Status: DC
  Administered 2017-06-09 – 2017-06-10 (×3): 5 mg via ORAL
  Filled 2017-06-08 (×3): qty 2

## 2017-06-08 MED ORDER — PSEUDOEPHEDRINE HCL ER 120 MG PO TB12
120.0000 mg | ORAL_TABLET | Freq: Two times a day (BID) | ORAL | Status: DC
  Administered 2017-06-08 – 2017-06-10 (×4): 120 mg via ORAL
  Filled 2017-06-08 (×4): qty 1

## 2017-06-08 MED ORDER — ALBUTEROL SULFATE (2.5 MG/3ML) 0.083% IN NEBU: 2.5000 mg | INHALATION_SOLUTION | RESPIRATORY_TRACT | Status: DC | PRN

## 2017-06-08 MED ORDER — AMLODIPINE BESYLATE 5 MG PO TABS
10.0000 mg | ORAL_TABLET | Freq: Every day | ORAL | Status: DC
  Administered 2017-06-09 – 2017-06-10 (×2): 10 mg via ORAL
  Filled 2017-06-08 (×2): qty 2

## 2017-06-08 MED ORDER — METHYLPREDNISOLONE SODIUM SUCC 125 MG IJ SOLR
80.0000 mg | Freq: Once | INTRAMUSCULAR | Status: AC
  Administered 2017-06-08: 80 mg via INTRAVENOUS
  Filled 2017-06-08: qty 2

## 2017-06-08 MED ORDER — AMOXICILLIN-POT CLAVULANATE 875-125 MG PO TABS
1.0000 | ORAL_TABLET | Freq: Two times a day (BID) | ORAL | Status: DC
  Administered 2017-06-08 – 2017-06-10 (×4): 1 via ORAL
  Filled 2017-06-08 (×4): qty 1

## 2017-06-08 MED ORDER — IPRATROPIUM-ALBUTEROL 0.5-2.5 (3) MG/3ML IN SOLN
3.0000 mL | Freq: Four times a day (QID) | RESPIRATORY_TRACT | Status: DC
  Administered 2017-06-08: 3 mL via RESPIRATORY_TRACT
  Filled 2017-06-08: qty 3

## 2017-06-08 MED ORDER — IBUPROFEN 400 MG PO TABS
400.0000 mg | ORAL_TABLET | Freq: Three times a day (TID) | ORAL | Status: DC
  Administered 2017-06-08 – 2017-06-10 (×5): 400 mg via ORAL
  Filled 2017-06-08 (×5): qty 1

## 2017-06-08 MED ORDER — ELVITEG-COBIC-EMTRICIT-TENOFAF 150-150-200-10 MG PO TABS
1.0000 | ORAL_TABLET | Freq: Every day | ORAL | Status: DC
  Administered 2017-06-08 – 2017-06-10 (×3): 1 via ORAL
  Filled 2017-06-08 (×3): qty 1

## 2017-06-08 MED ORDER — SODIUM CHLORIDE 0.9 % IV SOLN
INTRAVENOUS | Status: DC
  Administered 2017-06-08 – 2017-06-10 (×4): via INTRAVENOUS

## 2017-06-08 MED ORDER — DIPHENHYDRAMINE HCL 25 MG PO CAPS
25.0000 mg | ORAL_CAPSULE | Freq: Two times a day (BID) | ORAL | Status: DC
  Administered 2017-06-08 – 2017-06-09 (×2): 25 mg via ORAL
  Filled 2017-06-08 (×2): qty 1

## 2017-06-08 MED ORDER — PANTOPRAZOLE SODIUM 40 MG PO TBEC
40.0000 mg | DELAYED_RELEASE_TABLET | Freq: Every day | ORAL | Status: DC
  Administered 2017-06-09 – 2017-06-10 (×2): 40 mg via ORAL
  Filled 2017-06-08 (×2): qty 1

## 2017-06-08 MED ORDER — ACETAMINOPHEN 650 MG RE SUPP
650.0000 mg | Freq: Four times a day (QID) | RECTAL | Status: DC | PRN
Start: 2017-06-08 — End: 2017-06-10

## 2017-06-08 MED ORDER — GUAIFENESIN ER 600 MG PO TB12
600.0000 mg | ORAL_TABLET | Freq: Two times a day (BID) | ORAL | Status: DC
  Administered 2017-06-08 – 2017-06-10 (×4): 600 mg via ORAL
  Filled 2017-06-08 (×4): qty 1

## 2017-06-08 MED ORDER — PRAVASTATIN SODIUM 40 MG PO TABS
20.0000 mg | ORAL_TABLET | Freq: Every day | ORAL | Status: DC
  Administered 2017-06-08 – 2017-06-10 (×3): 20 mg via ORAL
  Filled 2017-06-08 (×3): qty 1

## 2017-06-08 MED ORDER — ACETAMINOPHEN 325 MG PO TABS: 650.0000 mg | ORAL_TABLET | Freq: Four times a day (QID) | ORAL | Status: DC | PRN

## 2017-06-08 MED ORDER — FLUTICASONE PROPIONATE 50 MCG/ACT NA SUSP: 2.0000 | Freq: Every day | NASAL | Status: DC | PRN

## 2017-06-08 MED ORDER — ENOXAPARIN SODIUM 40 MG/0.4ML ~~LOC~~ SOLN
40.0000 mg | SUBCUTANEOUS | Status: DC
  Administered 2017-06-08: 40 mg via SUBCUTANEOUS
  Filled 2017-06-08: qty 0.4

## 2017-06-08 MED ORDER — SALINE SPRAY 0.65 % NA SOLN
2.0000 | Freq: Three times a day (TID) | NASAL | Status: DC
  Administered 2017-06-08 – 2017-06-10 (×5): 2 via NASAL
  Filled 2017-06-08: qty 44

## 2017-06-08 NOTE — Progress Notes (Signed)
Janice Williams [id number] Admission Data: 06/08/2017 7:38 PM Attending Provider: Sid Falcon, MD  [id number], No Pcp Per Consults/ Treatment Team:   Janice Williams is a 56 y.o. female patient admitted from ED awake, alert  & orientated  X 3,  Full Code, VSS - Blood pressure (!) 119/93, pulse 89, temperature 98.5 F (36.9 C), resp. rate 16, height 5\' 3"  (1.6 m), weight 40.8 kg (90 lb), SpO2 99 %., O2    1 L nasal cannular, no c/o shortness of breath, no c/o chest pain, no distress noted.   IV site WDL:  forearm left, condition patent and no redness and antecubital right, condition patent and no redness with a transparent dsg that's clean dry and intact.  Allergies:   Allergies  Allergen Reactions  . Chantix [Varenicline] Itching     Past Medical History:  Diagnosis Date  . Arthritis   . HIV infection ([place])   . Hypertension   . Stroke ([place])   . Substance abuse    history, clean 7 years    History:  obtained from chart review. Tobacco/alcohol: unknown tobacco use history unreliable  Pt orientation to unit, room and routine. Information packet given to patient/family and safety video watched.  Admission INP armband ID verified with patient/family, and in place. SR up x 2, fall risk assessment complete with Patient and family verbalizing understanding of risks associated with falls. Pt verbalizes an understanding of how to use the call bell and to call for help before getting out of bed.  Skin, clean-dry- intact without evidence of bruising, or skin tears.   No evidence of skin break down noted on exam. color normal, vascularity normal, no rashes or suspicious lesions, no evidence of bleeding or bruising, no lesions noted, no rash, no edema, temperature normal, texture normal, mobility and turgor normal    Will cont to monitor and assist as needed.  Salley Slaughter, RN 06/08/2017 7:38 PM

## 2017-06-08 NOTE — H&P (Signed)
Date: 06/08/2017               Patient Name:  Janice Williams MRN: [id number]  DOB: 1961-08-16 Age / Sex: 56 y.o., female   PCP: Patient, No Pcp Per         Medical Service: Internal Medicine Teaching Service         Attending Physician: Dr. Sid Falcon, MD    First Contact: Dr. Hetty Ely Pager: [phone]  Second Contact: Dr. Juleen China Pager: [phone]       After Hours (After 5p/  First Contact Pager: [phone]  weekends / holidays): Second Contact Pager: [phone]   Chief Complaint: Congestion  History of Present Illness: Janice Williams is a 56 year old woman with past medical history of COPD, hypertension, hyperlipidemia, GERD and HIV came into the ED via EMS with 2 days of sinus congestion and believes she caught a cold from her sister. The sinus congestion is associated with green phlegm she is difficult to clear from her sinuses and throat. She also describes sinus pressure, eye pressure and sinus tenderness. Feels that she's been wheezing and had subjective fever, chills, weakness, nausea and decreased appetite. She tried Mucinex but this did not give her much relief. She denies chest pain, abdominal pain, difficulty breathing or shortness of breath. He said minimal sinus problems in the past but did see an ENT when she was young and was diagnosed with sinusitis at that time. She denies shortness of breath or chest pain with exertion. He denies peripheral edema or orthopnea. She reports her last COPD exacerbation requiring steroids and antibiotics was over a year ago and she hasnt needed albuterol for years. She has no shortness of breath with exertion.   In the ED she was afebrile, heart rate in the 100s, BP 132/88, respiration rate 18 and was sating 92% on room air. levalbuterol and Solu-Medrol 125.   Meds:  Current Meds  Medication Sig  . alendronate (FOSAMAX) 70 MG tablet TAKE 1 TABLET BY MOUTH ONCE A WEEK. TAKE WITH A FULL GLASS OF WATER ON EMPTY STOMACH  . amLODipine (NORVASC) 10 MG  tablet Take 1 tablet (10 mg total) by mouth daily.  . beclomethasone (BECONASE AQ) 42 MCG/SPRAY nasal spray Place 1 spray into both nostrils 2 (two) times daily. Dose is for each nostril. (Patient taking differently: Place 1 spray into both nostrils 2 (two) times daily as needed for allergies. Dose is for each nostril.)  . cefdinir (OMNICEF) 300 MG capsule Take 300 mg by mouth 2 (two) times daily.  [name] dronabinol (MARINOL) 5 MG capsule Take 1 capsule (5 mg total) by mouth 2 (two) times daily before a meal.  . ENSURE (ENSURE) Take 237 mLs by mouth 3 (three) times daily between meals.  . GENVOYA 150-150-200-10 MG TABS tablet TAKE 1 TABLET BY MOUTH DAILY WITH BREAKFAST.  [name] omeprazole (PRILOSEC) 20 MG capsule TAKE 1 CAPSULE BY MOUTH EVERY DAY  . polyethylene glycol (MIRALAX / GLYCOLAX) packet Take one capful in 8 oz of fluid of your choice twice a day until having soft stools, then back down to once a day.  If not having soft stools with twice a day, increased to three times a day (Patient taking differently: Take 17 g by mouth 2 (two) times daily as needed for mild constipation. )  . pravastatin (PRAVACHOL) 20 MG tablet TAKE 1 TABLET BY MOUTH EVERY DAY  . Vitamin D, Ergocalciferol, (DRISDOL) 50000 units CAPS capsule TAKE 1 CAPSULE BY MOUTH  EVERY 7 DAYS  . Witch Hazel (TUCKS) 50 % PADS Use as needed for rectal itching, after having BM (Patient taking differently: Place 1 application rectally daily as needed (itching). )    Allergies: Allergies as of 06/08/2017 - Review Complete 06/08/2017  Allergen Reaction Noted  . Chantix [varenicline] Itching 12/16/2012   Past Medical History:  Diagnosis Date  . Arthritis   . HIV infection ([place])   . Hypertension   . Stroke ([place])   . Substance abuse    history, clean 7 years   Family History: Quit Smoking 4 years ago, smoked 1.5 packs per day for 40 years. She denies alcohol use.   Social History: Father had atrial fibrillation  Review of Systems: A  complete ROS was negative except as per HPI.   Physical Exam: Blood pressure (!) 119/93, pulse 89, temperature 98.5 F (36.9 C), resp. rate 16, height 5\' 3"  (1.6 m), weight 90 lb (40.8 kg), SpO2 99 %. Physical Exam  Constitutional: She is oriented to person, place, and time and well-developed, well-nourished, and in no distress. No distress.  Appears cachectic  HENT:  Head: Normocephalic and atraumatic.  Mouth/Throat: No oropharyngeal exudate.  Eyes: Conjunctivae are normal. Right eye exhibits no discharge. Left eye exhibits no discharge. No scleral icterus.  Neck: Normal range of motion.  Cardiovascular: Normal rate, regular rhythm and intact distal pulses.   No murmur heard. Capillary refill less than 2 seconds  Pulmonary/Chest: Effort normal and breath sounds normal. No respiratory distress. She has no wheezes. She has no rales.  She is breathing through her mouth   Abdominal: Soft. Bowel sounds are normal. She exhibits no distension. There is no tenderness.  Lymphadenopathy:    She has no cervical adenopathy.  Neurological: She is alert and oriented to person, place, and time.  Skin: Skin is warm and dry. She is not diaphoretic.  Psychiatric: Affect and judgment normal.    LABS  Sodium 138, potassium 3.6, bicarbonate 26, BUN 11, creatinine 0.9, GFR >60  WBC 9.4, Hgb 12.8, Plt 318 Trop poc 0.01  BNP 23.7   EKG: Normal axis, sinus tachycardia, T-wave inversions and q waves in aVL V1 and V2   CXR: Personal review chest x-ray revealed hyperinflated lungs, flattening of the diaphragms, increased AP diameter no pulmonary edema or consolidation appreciated. Her left breast has an abnormal silhouette compared to the right.    Assessment & Plan by Problem: Principal Problem:   Sinusitis Active Problems:   Human immunodeficiency virus (HIV) disease ([place])   Hyperlipidemia   Hypertension  Sinusitis  Janice Williams is a 56 year old woman with past medical history of COPD, hypertension,  hyperlipidemia in the HIV who presents with 3 days of sinus congestion associated with purulent sputum, sinus pressure and tenderness, and fever. At presentation she is found to be tachycardic but afebrile and does not have a leukocytosis. She is not requiring oxygen and reports that her most uncomfortable symptom is not being able to clear the mucus in her upper respiratory tract. Chest xray was reassuring. Her lungs are clear to auscultation and we were not able to appreciate coughing or wheezing while in the room but have considered URI and COPD exacerbation on the differential. Her HIV has been under good control and she has maintained good immunologic status making opportunistic infections less likely however we will continue to monitor and assess if she does not improve with the standard treatment.  - IV fluid repletion -follow up morning BMP  - Flonase  -  augmentin  - albuterol PRN   HIV  03/2017 Last CD4 count 790, HIV viral load undetectable. She becomes very tearful when talking about this but reports good compliance with her home medications. - Continue home medications genvoya (elvitegravir-cobicistat-emtricitabine-tenofovir)   - continue home medication dronabinol - ordered ensure    Hypertension  - continue home med amlodipine 10 mg daily   GERD  Protonix 40 mg daily   Hyperlipidemia  - continue home med pravastatin 20 mg daily   Dispo: Admit patient to Observation with expected length of stay less than 2 midnights.  Signed: Ledell Noss, MD 06/08/2017, 6:35 PM  Pager: [phone]

## 2017-06-08 NOTE — ED Triage Notes (Signed)
Pt brought in by EMS due to having SOB. Per EMS was found gasping for air and coughing. Pt has hx of HIV. Pt a&ox4. Per pt, pt has had cough for 3 days and has gotten worse. Pt has copious amounts of sputum.
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
## 2017-06-08 NOTE — ED Notes (Signed)
Patient transported to X-ray 

## 2017-06-08 NOTE — ED Notes (Signed)
Admitting at bedside 

## 2017-06-09 DIAGNOSIS — Z7983 Long term (current) use of bisphosphonates: Secondary | ICD-10-CM | POA: Diagnosis not present

## 2017-06-09 DIAGNOSIS — Z7951 Long term (current) use of inhaled steroids: Secondary | ICD-10-CM | POA: Diagnosis not present

## 2017-06-09 DIAGNOSIS — Z8673 Personal history of transient ischemic attack (TIA), and cerebral infarction without residual deficits: Secondary | ICD-10-CM | POA: Diagnosis not present

## 2017-06-09 DIAGNOSIS — R000 Tachycardia, unspecified: Secondary | ICD-10-CM | POA: Diagnosis present

## 2017-06-09 DIAGNOSIS — J441 Chronic obstructive pulmonary disease with (acute) exacerbation: Secondary | ICD-10-CM

## 2017-06-09 DIAGNOSIS — Z8659 Personal history of other mental and behavioral disorders: Secondary | ICD-10-CM | POA: Diagnosis not present

## 2017-06-09 DIAGNOSIS — Z21 Asymptomatic human immunodeficiency virus [HIV] infection status: Secondary | ICD-10-CM | POA: Diagnosis present

## 2017-06-09 DIAGNOSIS — B20 Human immunodeficiency virus [HIV] disease: Secondary | ICD-10-CM | POA: Diagnosis not present

## 2017-06-09 DIAGNOSIS — Z792 Long term (current) use of antibiotics: Secondary | ICD-10-CM | POA: Diagnosis not present

## 2017-06-09 DIAGNOSIS — Z87891 Personal history of nicotine dependence: Secondary | ICD-10-CM | POA: Diagnosis not present

## 2017-06-09 DIAGNOSIS — Z79899 Other long term (current) drug therapy: Secondary | ICD-10-CM | POA: Diagnosis not present

## 2017-06-09 DIAGNOSIS — I129 Hypertensive chronic kidney disease with stage 1 through stage 4 chronic kidney disease, or unspecified chronic kidney disease: Secondary | ICD-10-CM | POA: Diagnosis present

## 2017-06-09 DIAGNOSIS — K219 Gastro-esophageal reflux disease without esophagitis: Secondary | ICD-10-CM | POA: Diagnosis present

## 2017-06-09 DIAGNOSIS — I10 Essential (primary) hypertension: Secondary | ICD-10-CM | POA: Diagnosis not present

## 2017-06-09 DIAGNOSIS — R64 Cachexia: Secondary | ICD-10-CM | POA: Diagnosis present

## 2017-06-09 DIAGNOSIS — Z888 Allergy status to other drugs, medicaments and biological substances status: Secondary | ICD-10-CM | POA: Diagnosis not present

## 2017-06-09 DIAGNOSIS — Z681 Body mass index (BMI) 19 or less, adult: Secondary | ICD-10-CM | POA: Diagnosis not present

## 2017-06-09 DIAGNOSIS — E785 Hyperlipidemia, unspecified: Secondary | ICD-10-CM | POA: Diagnosis present

## 2017-06-09 DIAGNOSIS — N189 Chronic kidney disease, unspecified: Secondary | ICD-10-CM | POA: Diagnosis present

## 2017-06-09 DIAGNOSIS — J0191 Acute recurrent sinusitis, unspecified: Secondary | ICD-10-CM | POA: Diagnosis present

## 2017-06-09 HISTORY — DX: Chronic obstructive pulmonary disease with (acute) exacerbation: J44.1

## 2017-06-09 LAB — CBC
HCT: 35.3 % — ABNORMAL LOW (ref 36.0–46.0)
Hemoglobin: 11.6 g/dL — ABNORMAL LOW (ref 12.0–15.0)
MCH: 32.5 pg (ref 26.0–34.0)
MCHC: 32.9 g/dL (ref 30.0–36.0)
MCV: 98.9 fL (ref 78.0–100.0)
Platelets: 296 10*3/uL (ref 150–400)
RBC: 3.57 MIL/uL — ABNORMAL LOW (ref 3.87–5.11)
RDW: 14.7 % (ref 11.5–15.5)
WBC: 9.3 10*3/uL (ref 4.0–10.5)

## 2017-06-09 LAB — BASIC METABOLIC PANEL
Anion gap: 8 (ref 5–15)
BUN: 13 mg/dL (ref 6–20)
CO2: 25 mmol/L (ref 22–32)
Calcium: 9.4 mg/dL (ref 8.9–10.3)
Chloride: 103 mmol/L (ref 101–111)
Creatinine, Ser: 0.93 mg/dL (ref 0.44–1.00)
GFR calc Af Amer: >60 mL/min (ref >60)
GFR calc non Af Amer: >60 mL/min (ref >60)
Glucose, Bld: 134 mg/dL — ABNORMAL HIGH (ref 65–99)
Potassium: 4.5 mmol/L (ref 3.5–5.1)
Sodium: 136 mmol/L (ref 135–145)

## 2017-06-09 MED ORDER — ENOXAPARIN SODIUM 30 MG/0.3ML ~~LOC~~ SOLN
30.0000 mg | SUBCUTANEOUS | Status: DC
  Administered 2017-06-09: 30 mg via SUBCUTANEOUS
  Filled 2017-06-09: qty 0.3

## 2017-06-09 MED ORDER — GUAIFENESIN-DM 100-10 MG/5ML PO SYRP
5.0000 mL | ORAL_SOLUTION | ORAL | Status: DC | PRN
  Administered 2017-06-10 (×2): 5 mL via ORAL
  Filled 2017-06-09 (×2): qty 5

## 2017-06-09 MED ORDER — LORATADINE 10 MG PO TABS
10.0000 mg | ORAL_TABLET | Freq: Every day | ORAL | Status: DC
  Administered 2017-06-09 – 2017-06-10 (×2): 10 mg via ORAL
  Filled 2017-06-09 (×2): qty 1

## 2017-06-09 MED ORDER — PREDNISONE 20 MG PO TABS
40.0000 mg | ORAL_TABLET | Freq: Every day | ORAL | Status: DC
  Administered 2017-06-09 – 2017-06-10 (×2): 40 mg via ORAL
  Filled 2017-06-09 (×2): qty 2

## 2017-06-09 MED ORDER — IPRATROPIUM-ALBUTEROL 0.5-2.5 (3) MG/3ML IN SOLN
3.0000 mL | RESPIRATORY_TRACT | Status: DC
  Administered 2017-06-09: 3 mL via RESPIRATORY_TRACT
  Filled 2017-06-09 (×3): qty 3

## 2017-06-09 MED ORDER — IPRATROPIUM-ALBUTEROL 0.5-2.5 (3) MG/3ML IN SOLN
3.0000 mL | Freq: Four times a day (QID) | RESPIRATORY_TRACT | Status: DC
  Administered 2017-06-10 (×2): 3 mL via RESPIRATORY_TRACT
  Filled 2017-06-09 (×2): qty 3

## 2017-06-09 MED ORDER — KETOROLAC TROMETHAMINE 15 MG/ML IJ SOLN
15.0000 mg | Freq: Once | INTRAMUSCULAR | Status: AC | PRN
  Administered 2017-06-09: 15 mg via INTRAVENOUS
  Filled 2017-06-09: qty 1

## 2017-06-09 NOTE — Discharge Instructions (Signed)
Janice Williams, Janice Williams glad you are feeling better, we think you have a sinus infection and a mild worsening of your COPD,  START taking augmentin twice daily until you complete all of these pills on 6/28  START taking prednisone 2 tablets (40 mg) daily until you complete all of these pills on  June 26   Schedule a follow up appointment with your primary care doctor within the next week.  If you have any questions about this hospitalization call the internal medicine center [phone]

## 2017-06-09 NOTE — Progress Notes (Signed)
   Subjective: [place] that her mucus has broken up some and she has had better success with blowing her nose and coughing today. She did develop wheezing again today and has a pain on her chest wall related to coughing.   Objective:  Vital signs in last 24 hours: Vitals:   06/08/17 1657 06/08/17 2044 06/08/17 2126 06/09/17 0409  BP: (!) 119/93  119/76 115/68  Pulse: 89 (!) 101 97 95  Resp: 16 18  16   Temp: 98.5 F (36.9 C)  98.3 F (36.8 C) 97.9 F (36.6 C)  TempSrc:   Oral   SpO2: 99% 97% 99% 99%  Weight:      Height:       Physical Exam  Constitutional: She is oriented to person, place, and time and well-developed, well-nourished, and in no distress. No distress.  HENT:  Head: Normocephalic and atraumatic.  Cardiovascular: Normal rate and regular rhythm.   No murmur heard. Pulmonary/Chest: Effort normal. No respiratory distress. She has wheezes. She has no rales. She exhibits tenderness.  Breathing through her nose   Abdominal: Soft. Bowel sounds are normal. She exhibits no distension. There is no tenderness.  Neurological: She is alert and oriented to person, place, and time.  Skin: Skin is warm and dry. She is not diaphoretic.  Psychiatric: Affect and judgment normal.   Assessment/Plan:    Sinusitis   COPD with acute exacerbation (HCC) Mild  Thick sinus congestion sounds like its improving, she does have wheezing today so we will add steroids and plan to treat for a COPD exacerbation at well.  - received solumedrol 125 mg yesterday, ordered prednisone 40 mg daily for a 5 day total course - continue augmentin BID 6/22 > 7 day course with stop date 6/28  - flonase, pseudoephedrine, and mucinex  - D/c benadryl and started claritin - start duoneb scheduled  - continue albuterol PRN      Human immunodeficiency virus (HIV) disease ([place]) - Continue home medications genvoya (elvitegravir-cobicistat-emtricitabine-tenofovir)   - continue home medication dronabinol - ordered  ensure    Hyperlipidemia - continue home med pravastatin 20 mg daily     Hypertension  Normotensive - continue home med amlodipine 10 mg daily   GERD  - continue home med protonix 40 mg daily   Dispo: Anticipated discharge today   Ledell Noss, MD 06/09/2017, 12:00 PM Pager: [phone]

## 2017-06-10 LAB — BASIC METABOLIC PANEL
Anion gap: 8 (ref 5–15)
BUN: 17 mg/dL (ref 6–20)
CO2: 23 mmol/L (ref 22–32)
Calcium: 9.1 mg/dL (ref 8.9–10.3)
Chloride: 105 mmol/L (ref 101–111)
Creatinine, Ser: 0.9 mg/dL (ref 0.44–1.00)
GFR calc Af Amer: >60 mL/min (ref >60)
GFR calc non Af Amer: >60 mL/min (ref >60)
Glucose, Bld: 128 mg/dL — ABNORMAL HIGH (ref 65–99)
Potassium: 3.4 mmol/L — ABNORMAL LOW (ref 3.5–5.1)
Sodium: 136 mmol/L (ref 135–145)

## 2017-06-10 MED ORDER — PREDNISONE 20 MG PO TABS: 40.0000 mg | ORAL_TABLET | Freq: Every day | ORAL | 0 refills | Status: DC

## 2017-06-10 MED ORDER — PREDNISONE 20 MG PO TABS: 40.0000 mg | ORAL_TABLET | Freq: Every day | ORAL | 0 refills | Status: AC

## 2017-06-10 MED ORDER — LORATADINE 10 MG PO TABS: 10.0000 mg | ORAL_TABLET | Freq: Every day | ORAL | 0 refills | Status: DC

## 2017-06-10 MED ORDER — AMOXICILLIN-POT CLAVULANATE 875-125 MG PO TABS: 1.0000 | ORAL_TABLET | Freq: Two times a day (BID) | ORAL | 0 refills | Status: AC

## 2017-06-10 MED ORDER — IPRATROPIUM-ALBUTEROL 0.5-2.5 (3) MG/3ML IN SOLN: 3.0000 mL | RESPIRATORY_TRACT | Status: DC | PRN

## 2017-06-10 MED ORDER — POTASSIUM CHLORIDE CRYS ER 20 MEQ PO TBCR
40.0000 meq | EXTENDED_RELEASE_TABLET | Freq: Once | ORAL | Status: AC
  Administered 2017-06-10: 40 meq via ORAL
  Filled 2017-06-10: qty 2

## 2017-06-10 MED ORDER — ALBUTEROL SULFATE HFA 108 (90 BASE) MCG/ACT IN AERS: 2.0000 | INHALATION_SPRAY | Freq: Four times a day (QID) | RESPIRATORY_TRACT | 0 refills | Status: DC | PRN

## 2017-06-10 MED ORDER — SALINE SPRAY 0.65 % NA SOLN: 2.0000 | Freq: Three times a day (TID) | NASAL | 0 refills | Status: DC

## 2017-06-10 MED ORDER — AMOXICILLIN-POT CLAVULANATE 875-125 MG PO TABS: 1.0000 | ORAL_TABLET | Freq: Two times a day (BID) | ORAL | 0 refills | Status: DC

## 2017-06-10 NOTE — Progress Notes (Signed)
   Subjective:  Patient seen and examined.  No acute events.  Feels well and ready to go home.  Objective:  Vital signs in last 24 hours: Vitals:   06/09/17 2142 06/10/17 0150 06/10/17 0634 06/10/17 0737  BP: 107/70  (!) 99/57   Pulse: 85  85   Resp: 18  20   Temp: 98.1 F (36.7 C)  98.6 F (37 C)   TempSrc: Oral  Oral   SpO2: 100% 98% 100% 99%  Weight:      Height:       Physical Exam  Constitutional: She is oriented to person, place, and time and well-developed, well-nourished, and in no distress. No distress.  HENT:  Head: Normocephalic and atraumatic.  Cardiovascular: Normal rate and regular rhythm.   No murmur heard. Pulmonary/Chest: Breath sounds normal. No respiratory distress. She has no wheezes.  Abdominal: Soft. Bowel sounds are normal. She exhibits no distension. There is no tenderness.  Musculoskeletal: She exhibits no edema.  Neurological: She is alert and oriented to person, place, and time.  Skin: Skin is warm and dry. She is not diaphoretic.  Psychiatric: Affect and judgment normal.   Assessment/Plan:    Sinusitis   COPD with acute exacerbation (HCC) Mild  - prednisone 40 mg daily for a 5 day total course.  Stop date 6/26 - continue augmentin BID 6/22 > 7 day course with stop date 6/28  - resume home intranasal steroids - saline nasal irrigation - smoking cessation - home today     Human immunodeficiency virus (HIV) disease ([place]) - Continue home medications genvoya (elvitegravir-cobicistat-emtricitabine-tenofovir)    Dispo: Anticipated discharge today   Jule Ser, DO 06/10/2017, 12:09 PM Pager: [phone]

## 2017-06-10 NOTE — Discharge Summary (Signed)
Name: Janice Williams MRN: [id number] DOB: [birth year] 56 y.o. PCP: Patient, No Pcp Per  Date of Admission: 06/08/2017 10:41 AM Date of Discharge: 06/10/2017 Attending Physician: Sid Falcon, MD  Discharge Diagnosis:  Principal Problem:   Sinusitis Active Problems:   Human immunodeficiency virus (HIV) disease ([place])   Hyperlipidemia   Hypertension   COPD with acute exacerbation [facility]   Discharge Medications: Allergies as of 06/10/2017      Reactions   Chantix [varenicline] Itching      Medication List    STOP taking these medications   cefdinir 300 MG capsule Commonly known as:  OMNICEF     TAKE these medications   albuterol 108 (90 Base) MCG/ACT inhaler Commonly known as:  PROVENTIL HFA;VENTOLIN HFA Inhale 2 puffs into the lungs every 6 (six) hours as needed for wheezing or shortness of breath.   alendronate 70 MG tablet Commonly known as:  FOSAMAX TAKE 1 TABLET BY MOUTH ONCE A WEEK. TAKE WITH A FULL GLASS OF WATER ON EMPTY STOMACH   amLODipine 10 MG tablet Commonly known as:  NORVASC Take 1 tablet (10 mg total) by mouth daily.   amoxicillin-clavulanate 875-125 MG tablet Commonly known as:  AUGMENTIN Take 1 tablet by mouth every 12 (twelve) hours.   beclomethasone 42 MCG/SPRAY nasal spray Commonly known as:  BECONASE AQ Place 1 spray into both nostrils 2 (two) times daily. Dose is for each nostril. What changed:  when to take this  reasons to take this  additional instructions   calcium-vitamin D 500-200 MG-UNIT tablet Commonly known as:  OSCAL WITH D Take 1 tablet by mouth 2 (two) times daily.   dronabinol 5 MG capsule Commonly known as:  MARINOL Take 1 capsule (5 mg total) by mouth 2 (two) times daily before a meal.   ENSURE Take 237 mLs by mouth 3 (three) times daily between meals.   GENVOYA 150-150-200-10 MG Tabs tablet Generic drug:  elvitegravir-cobicistat-emtricitabine-tenofovir TAKE 1 TABLET BY MOUTH DAILY WITH BREAKFAST.     loratadine 10 MG tablet Commonly known as:  CLARITIN Take 1 tablet (10 mg total) by mouth daily.   omeprazole 20 MG capsule Commonly known as:  PRILOSEC TAKE 1 CAPSULE BY MOUTH EVERY DAY   polyethylene glycol packet Commonly known as:  MIRALAX / GLYCOLAX Take one capful in 8 oz of fluid of your choice twice a day until having soft stools, then back down to once a day.  If not having soft stools with twice a day, increased to three times a day What changed:  how much to take  how to take this  when to take this  reasons to take this  additional instructions   pramoxine 1 % foam Commonly known as:  PROCTOFOAM Place 1 application rectally 3 (three) times daily as needed for itching.   pravastatin 20 MG tablet Commonly known as:  PRAVACHOL TAKE 1 TABLET BY MOUTH EVERY DAY   predniSONE 20 MG tablet Commonly known as:  DELTASONE Take 2 tablets (40 mg total) by mouth daily with breakfast.   sodium chloride 0.65 % Soln nasal spray Commonly known as:  OCEAN Place 2 sprays into both nostrils 3 (three) times daily.   traMADol 50 MG tablet Commonly known as:  ULTRAM Take 1 tablet (50 mg total) by mouth every 12 (twelve) hours.   TUCKS 50 % Pads Use as needed for rectal itching, after having BM What changed:  how much to take  how to take this  when  to take this  reasons to take this  additional instructions   Vitamin D (Ergocalciferol) 50000 units Caps capsule Commonly known as:  DRISDOL TAKE 1 CAPSULE BY MOUTH EVERY 7 DAYS       Disposition and follow-up:   Janice Williams was discharged from [facility] in Stable condition.   1.  At the hospital follow up visit please address:  - completion of antibiotics and prednisone course - augmentin stop date 6/28 - prednisone stop date 6/26 - have her sinusitis and breathing symptoms resolved themselves  2.  Labs / imaging needed at time of follow-up: None  3.  Pending labs/ test needing  follow-up: None  Follow-up Appointments: Follow-up Information    Carlyle Basques, MD. Schedule an appointment as soon as possible for a visit in 1 week(s).   Specialty:  Infectious Diseases Contact information: [address] [place]  [id number] [phone]           Hospital Course by problem list: Principal Problem:   Sinusitis Active Problems:   Human immunodeficiency virus (HIV) disease ([place])   Hyperlipidemia   Hypertension   COPD with acute exacerbation ([place])   Sinusitis COPD with acute exacerbation ([place]) Mild  Patient presented with sinus congestion, maxillary tenderness, subjective fevers at home, thick mucus and wheezing that seemed most consistent with acute rhinosinusitis and subsequent mild COPD exacerbation.  Her sinusitis symptoms were treated symptomatically and with a course of Augmentin.  Her COPD exacerbation was treated with breathing treatments and a short course of prednisone.  She is prescribed beclomethasone nasal spray by [facility] as fluticasone is contraindicated with her ART regimen.  She was, however, given fluticasone while inpatient as this is on formulary and was not going to be a long-term medication.  At discharge she was given a new prescription for loratidine and albuterol along with her Augmentin and prednisone.  Her HIV disease is well-controlled so there was low-suspicion for an opportunistic infection.  Her oxygen saturations remained appropriate on room air and she did not reveal any further infectious symptoms.    Human immunodeficiency virus (HIV) disease ([place]) Her home medication Genvoya (elvitegravir-cobicistat-emtricitabine-tenofovir) was continued.  Discharge Vitals:   BP (!) 99/57 (BP Location: Right Arm)   Pulse 85   Temp 98.6 F (37 C) (Oral)   Resp 20   Ht 5\' 3"  (1.6 m)   Wt 90 lb (40.8 kg)   SpO2 99%   BMI 15.94 kg/m   Pertinent Labs, Studies, and Procedures:  CXR: Lungs hyperexpanded. No edema or  consolidation.   CBC Latest Ref Rng & Units 06/09/2017 06/08/2017 04/09/2017  WBC 4.0 - 10.5 K/uL 9.3 9.4 3.7(L)  Hemoglobin 12.0 - 15.0 g/dL 11.6(L) 12.8 11.9  Hematocrit 36.0 - 46.0 % 35.3(L) 38.0 35.9  Platelets 150 - 400 K/uL 296 318 261   BMP Latest Ref Rng & Units 06/10/2017 06/09/2017 06/08/2017  Glucose 65 - 99 mg/dL 128(H) 134(H) 131(H)  BUN 6 - 20 mg/dL 17 13 11   Creatinine 0.44 - 1.00 mg/dL 0.90 0.93 0.94  Sodium 135 - 145 mmol/L 136 136 138  Potassium 3.5 - 5.1 mmol/L 3.4(L) 4.5 3.6  Chloride 101 - 111 mmol/L 105 103 103  CO2 22 - 32 mmol/L 23 25 26   Calcium 8.9 - 10.3 mg/dL 9.1 9.4 9.6   Lab Results  Component Value Date   HIV1RNAQUANT <20 DETECTED (A) 04/09/2017   Lab Results  Component Value Date   CD4TABS 790 04/09/2017  CD4TABS 910 01/25/2017   CD4TABS 620 05/08/2016     Discharge Instructions: Discharge Instructions    Diet - low sodium heart healthy    Complete by:  As directed    Diet - low sodium heart healthy    Complete by:  As directed    Discharge instructions    Complete by:  As directed    Janice Williams, Janice Williams were admitted with sinus infection and a mild exacerbation of your COPD.  We have ordered some new medications to take short term while you recover.  Please take the following: 1. Augmentin - you will take this twice per day 2. Prednisone - you will take this once per day 3. Loratidine is for allergies 4. Continue to use your saline nasal spray and start using the nasal spray prescribed by Dr. Storm Frisk office  Take Care.   Increase activity slowly    Complete by:  As directed    Increase activity slowly    Complete by:  As directed       Signed: Jule Ser, DO 06/10/2017, 12:26 PM   Pager: [phone]

## 2017-06-10 NOTE — Progress Notes (Signed)
Nsg Discharge Note  Admit Date:  06/08/2017 Discharge date: 06/10/2017   Janice Williams to be D/C'd Home per MD order.  AVS completed.  Copy for chart, and copy for patient signed, and dated. Patient/caregiver able to verbalize understanding.  Discharge Medication: Allergies as of 06/10/2017      Reactions   Chantix [varenicline] Itching      Medication List    STOP taking these medications   cefdinir 300 MG capsule Commonly known as:  OMNICEF     TAKE these medications   albuterol 108 (90 Base) MCG/ACT inhaler Commonly known as:  PROVENTIL HFA;VENTOLIN HFA Inhale 2 puffs into the lungs every 6 (six) hours as needed for wheezing or shortness of breath.   alendronate 70 MG tablet Commonly known as:  FOSAMAX TAKE 1 TABLET BY MOUTH ONCE A WEEK. TAKE WITH A FULL GLASS OF WATER ON EMPTY STOMACH   amLODipine 10 MG tablet Commonly known as:  NORVASC Take 1 tablet (10 mg total) by mouth daily.   amoxicillin-clavulanate 875-125 MG tablet Commonly known as:  AUGMENTIN Take 1 tablet by mouth every 12 (twelve) hours.   beclomethasone 42 MCG/SPRAY nasal spray Commonly known as:  BECONASE AQ Place 1 spray into both nostrils 2 (two) times daily. Dose is for each nostril. What changed:  when to take this  reasons to take this  additional instructions   calcium-vitamin D 500-200 MG-UNIT tablet Commonly known as:  OSCAL WITH D Take 1 tablet by mouth 2 (two) times daily.   dronabinol 5 MG capsule Commonly known as:  MARINOL Take 1 capsule (5 mg total) by mouth 2 (two) times daily before a meal.   ENSURE Take 237 mLs by mouth 3 (three) times daily between meals.   GENVOYA 150-150-200-10 MG Tabs tablet Generic drug:  elvitegravir-cobicistat-emtricitabine-tenofovir TAKE 1 TABLET BY MOUTH DAILY WITH BREAKFAST.   loratadine 10 MG tablet Commonly known as:  CLARITIN Take 1 tablet (10 mg total) by mouth daily.   omeprazole 20 MG capsule Commonly known as:  PRILOSEC TAKE 1  CAPSULE BY MOUTH EVERY DAY   polyethylene glycol packet Commonly known as:  MIRALAX / GLYCOLAX Take one capful in 8 oz of fluid of your choice twice a day until having soft stools, then back down to once a day.  If not having soft stools with twice a day, increased to three times a day What changed:  how much to take  how to take this  when to take this  reasons to take this  additional instructions   pramoxine 1 % foam Commonly known as:  PROCTOFOAM Place 1 application rectally 3 (three) times daily as needed for itching.   pravastatin 20 MG tablet Commonly known as:  PRAVACHOL TAKE 1 TABLET BY MOUTH EVERY DAY   predniSONE 20 MG tablet Commonly known as:  DELTASONE Take 2 tablets (40 mg total) by mouth daily with breakfast.   sodium chloride 0.65 % Soln nasal spray Commonly known as:  OCEAN Place 2 sprays into both nostrils 3 (three) times daily.   traMADol 50 MG tablet Commonly known as:  ULTRAM Take 1 tablet (50 mg total) by mouth every 12 (twelve) hours.   TUCKS 50 % Pads Use as needed for rectal itching, after having BM What changed:  how much to take  how to take this  when to take this  reasons to take this  additional instructions   Vitamin D (Ergocalciferol) 50000 units Caps capsule Commonly known as:  DRISDOL  TAKE 1 CAPSULE BY MOUTH EVERY 7 DAYS       Discharge Assessment: Vitals:   06/09/17 2142 06/10/17 0634  BP: 107/70 (!) 99/57  Pulse: 85 85  Resp: 18 20  Temp: 98.1 F (36.7 C) 98.6 F (37 C)   Skin clean, dry and intact without evidence of skin break down, no evidence of skin tears noted. IV catheter discontinued intact. Site without signs and symptoms of complications - no redness or edema noted at insertion site, patient denies c/o pain - only slight tenderness at site.  Dressing with slight pressure applied.  D/c Instructions-Education: Discharge instructions given to patient/family with verbalized understanding. D/c education  completed with patient/family including follow up instructions, medication list, d/c activities limitations if indicated, with other d/c instructions as indicated by MD - patient able to verbalize understanding, all questions fully answered. Patient instructed to return to ED, call 911, or call MD for any changes in condition.  Patient escorted via [place], and D/C home via private auto.  Salley Slaughter, RN 06/10/2017 3:10 PM

## 2017-06-13 ENCOUNTER — Other Ambulatory Visit: Payer: Self-pay | Admitting: Internal Medicine

## 2017-06-13 DIAGNOSIS — I10 Essential (primary) hypertension: Secondary | ICD-10-CM

## 2017-06-14 ENCOUNTER — Other Ambulatory Visit: Payer: Self-pay | Admitting: *Deleted

## 2017-06-14 DIAGNOSIS — I10 Essential (primary) hypertension: Secondary | ICD-10-CM

## 2017-06-14 MED ORDER — AMLODIPINE BESYLATE 10 MG PO TABS: 10.0000 mg | ORAL_TABLET | Freq: Every day | ORAL | 1 refills | Status: DC

## 2017-06-14 NOTE — Telephone Encounter (Signed)
Medication Refill, Needing [employer] PA for beconase nasal spray.  Medicaid PA completed and faxed.  Patient notified.

## 2017-06-18 ENCOUNTER — Other Ambulatory Visit: Payer: Self-pay | Admitting: *Deleted

## 2017-06-18 DIAGNOSIS — I10 Essential (primary) hypertension: Secondary | ICD-10-CM

## 2017-06-18 MED ORDER — AMLODIPINE BESYLATE 10 MG PO TABS: 10.0000 mg | ORAL_TABLET | Freq: Every day | ORAL | 1 refills | Status: DC

## 2017-06-21 ENCOUNTER — Other Ambulatory Visit: Payer: Self-pay | Admitting: *Deleted

## 2017-06-21 DIAGNOSIS — E785 Hyperlipidemia, unspecified: Secondary | ICD-10-CM

## 2017-06-21 DIAGNOSIS — I10 Essential (primary) hypertension: Secondary | ICD-10-CM

## 2017-06-21 MED ORDER — AMLODIPINE BESYLATE 10 MG PO TABS: 10.0000 mg | ORAL_TABLET | Freq: Every day | ORAL | 5 refills | Status: DC

## 2017-06-21 MED ORDER — PRAVASTATIN SODIUM 20 MG PO TABS: 20.0000 mg | ORAL_TABLET | Freq: Every day | ORAL | 5 refills | Status: DC

## 2017-06-27 ENCOUNTER — Other Ambulatory Visit: Payer: Medicaid Other

## 2017-06-27 ENCOUNTER — Other Ambulatory Visit (HOSPITAL_COMMUNITY)
Admission: RE | Admit: 2017-06-27 | Discharge: 2017-06-27 | Disposition: A | Discharge status: Home / Self Care | Payer: Medicaid Other | Source: Ambulatory Visit | Attending: Internal Medicine | Admitting: Internal Medicine

## 2017-06-27 DIAGNOSIS — B20 Human immunodeficiency virus [HIV] disease: Secondary | ICD-10-CM

## 2017-06-27 DIAGNOSIS — Z113 Encounter for screening for infections with a predominantly sexual mode of transmission: Secondary | ICD-10-CM | POA: Insufficient documentation

## 2017-06-27 LAB — COMPLETE METABOLIC PANEL WITH GFR
ALT: 6 U/L (ref 6–29)
AST: 13 U/L (ref 10–35)
Albumin: 4.1 g/dL (ref 3.6–5.1)
Alkaline Phosphatase: 47 U/L (ref 33–130)
BUN: 16 mg/dL (ref 7–25)
CO2: 25 mmol/L (ref 20–31)
Calcium: 9.4 mg/dL (ref 8.6–10.4)
Chloride: 101 mmol/L (ref 98–110)
Creat: 0.91 mg/dL (ref 0.50–1.05)
GFR, Est African American: 82 mL/min (ref >=60)
GFR, Est Non African American: 71 mL/min (ref >=60)
Glucose, Bld: 83 mg/dL (ref 65–99)
Potassium: 4.3 mmol/L (ref 3.5–5.3)
Sodium: 138 mmol/L (ref 135–146)
Total Bilirubin: 0.4 mg/dL (ref 0.2–1.2)
Total Protein: 6.8 g/dL (ref 6.1–8.1)

## 2017-06-27 LAB — CBC WITH DIFFERENTIAL/PLATELET
Basophils Absolute: 43 cells/uL (ref 0–200)
Basophils Relative: 1 %
Eosinophils Absolute: 86 cells/uL (ref 15–500)
Eosinophils Relative: 2 %
HCT: 32.9 % — ABNORMAL LOW (ref 35.0–45.0)
Hemoglobin: 11 g/dL — ABNORMAL LOW (ref 11.7–15.5)
Lymphocytes Relative: 34 %
Lymphs Abs: 1462 cells/uL (ref 850–3900)
MCH: 33.3 pg — ABNORMAL HIGH (ref 27.0–33.0)
MCHC: 33.4 g/dL (ref 32.0–36.0)
MCV: 99.7 fL (ref 80.0–100.0)
MPV: 8.9 fL (ref 7.5–12.5)
Monocytes Absolute: 258 cells/uL (ref 200–950)
Monocytes Relative: 6 %
Neutro Abs: 2451 cells/uL (ref 1500–7800)
Neutrophils Relative %: 57 %
Platelets: 306 10*3/uL (ref 140–400)
RBC: 3.3 MIL/uL — ABNORMAL LOW (ref 3.80–5.10)
RDW: 15.2 % — ABNORMAL HIGH (ref 11.0–15.0)
WBC: 4.3 10*3/uL (ref 3.8–10.8)

## 2017-06-28 ENCOUNTER — Ambulatory Visit: Payer: Medicaid Other

## 2017-06-29 LAB — T-HELPER CELL (CD4) - (RCID CLINIC ONLY)
CD4 % Helper T Cell: 39 % (ref 33–55)
CD4 T Cell Abs: 580 /uL (ref 400–2700)

## 2017-06-29 LAB — URINE CYTOLOGY ANCILLARY ONLY
Chlamydia: NEGATIVE
Neisseria Gonorrhea: NEGATIVE

## 2017-06-29 LAB — HIV-1 RNA QUANT-NO REFLEX-BLD
HIV 1 RNA Quant: <20 copies/mL — AB
HIV-1 RNA Quant, Log: <1.3 Log copies/mL — AB

## 2017-07-05 ENCOUNTER — Ambulatory Visit: Payer: Medicaid Other

## 2017-07-10 ENCOUNTER — Ambulatory Visit: Payer: Medicaid Other | Admitting: Internal Medicine

## 2017-07-12 ENCOUNTER — Other Ambulatory Visit: Payer: Medicaid Other

## 2017-07-20 ENCOUNTER — Ambulatory Visit: Payer: Medicaid Other

## 2017-07-20 ENCOUNTER — Ambulatory Visit
Admission: RE | Admit: 2017-07-20 | Discharge: 2017-07-20 | Disposition: A | Discharge status: Home / Self Care | Payer: Medicaid Other | Source: Ambulatory Visit | Attending: Internal Medicine | Admitting: Internal Medicine

## 2017-07-20 DIAGNOSIS — Z1231 Encounter for screening mammogram for malignant neoplasm of breast: Secondary | ICD-10-CM | POA: Diagnosis not present

## 2017-07-20 IMAGING — MG DIGITAL SCREENING BILATERAL MAMMOGRAM WITH CAD
6 series · 6 of 6 positions shown · non-contrast
Comparison: Previous exam(s).

CLINICAL DATA: Screening.

EXAM:
DIGITAL SCREENING BILATERAL MAMMOGRAM WITH CAD

[R MLO]
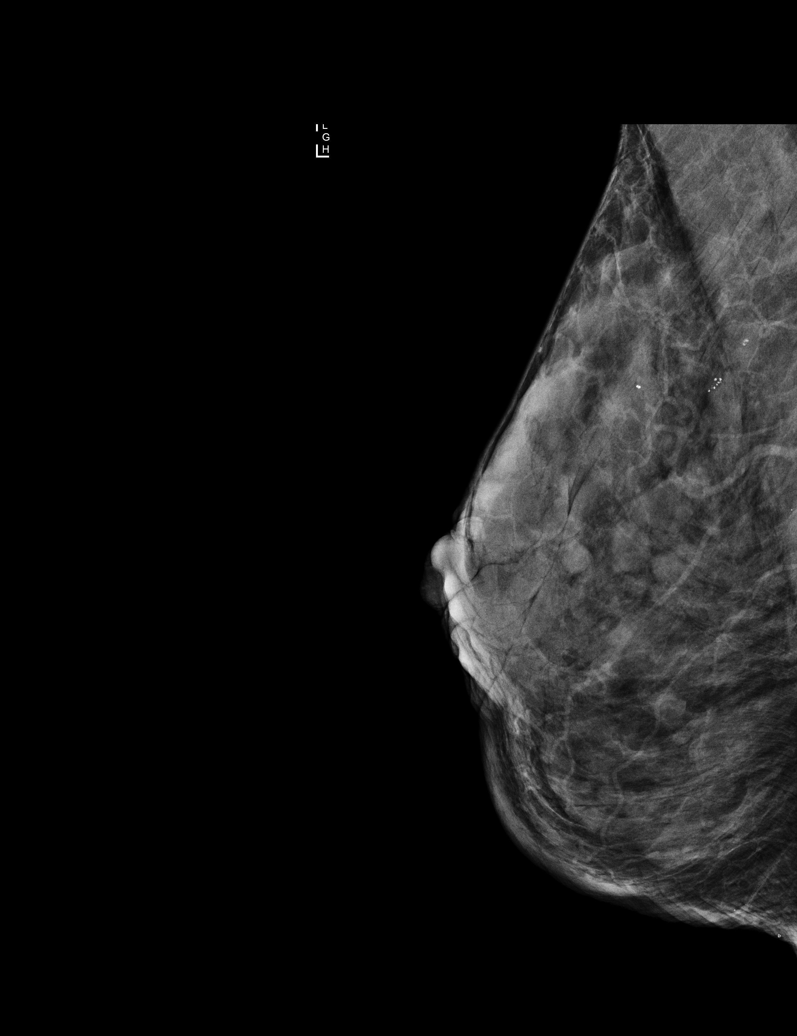

[L CC]
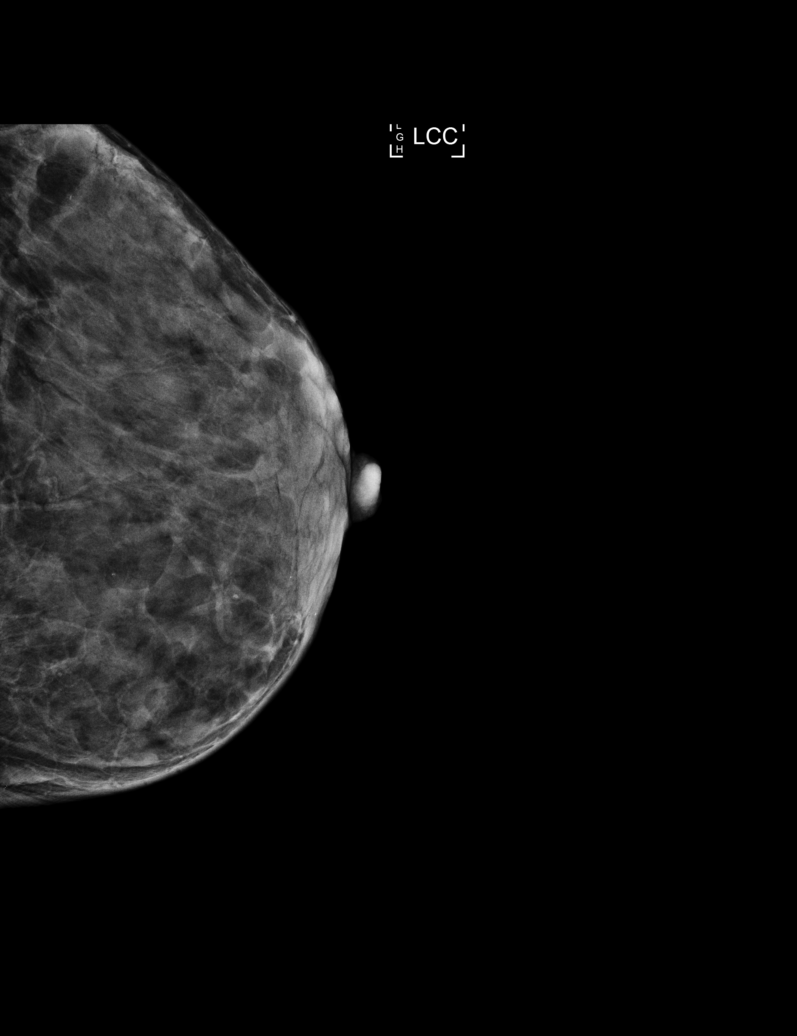

[L MLO (1 of 2)]
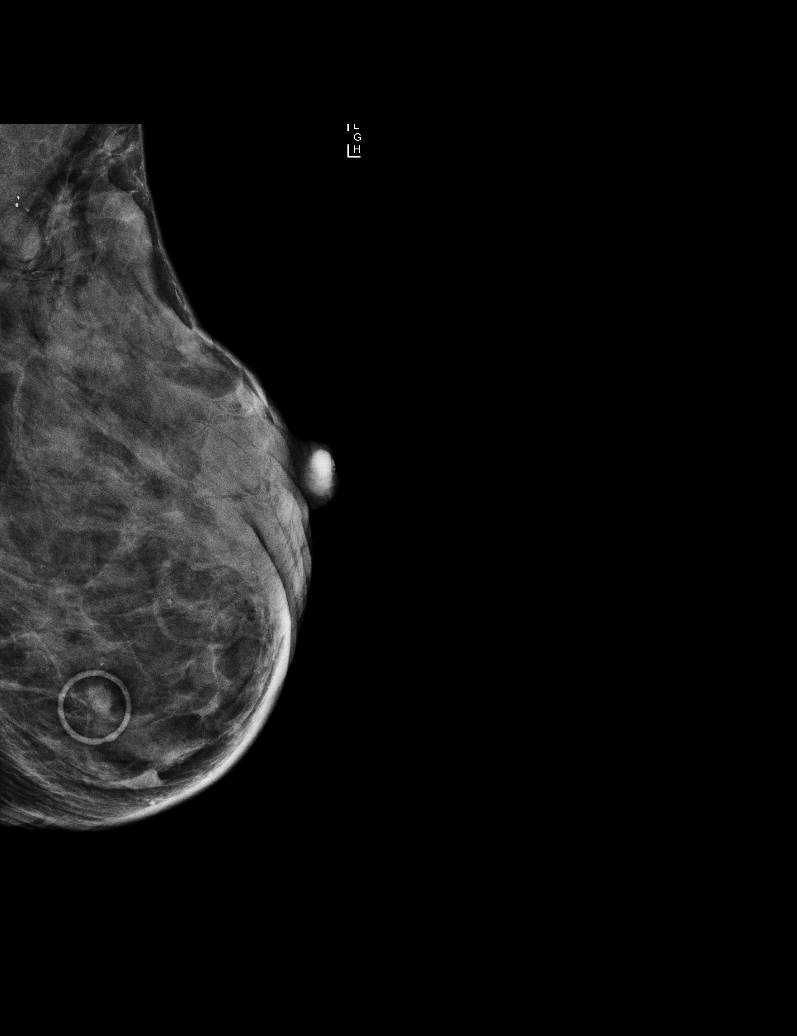

[R CC (1 of 2)]
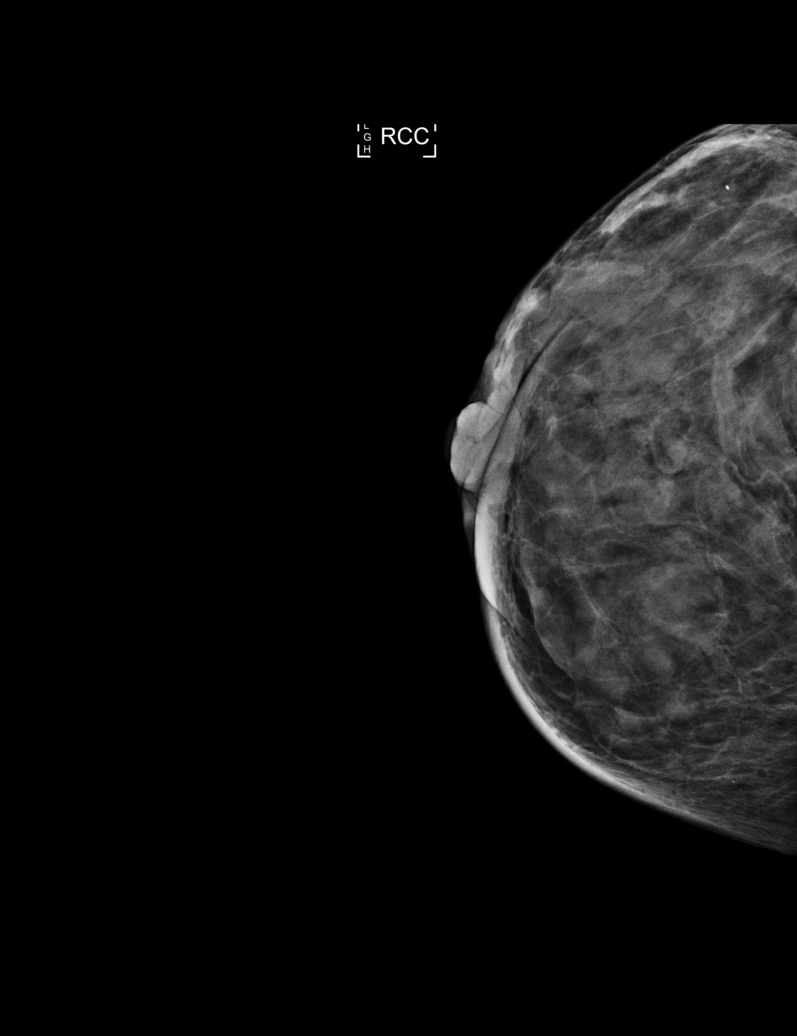

[R CC (2 of 2)]
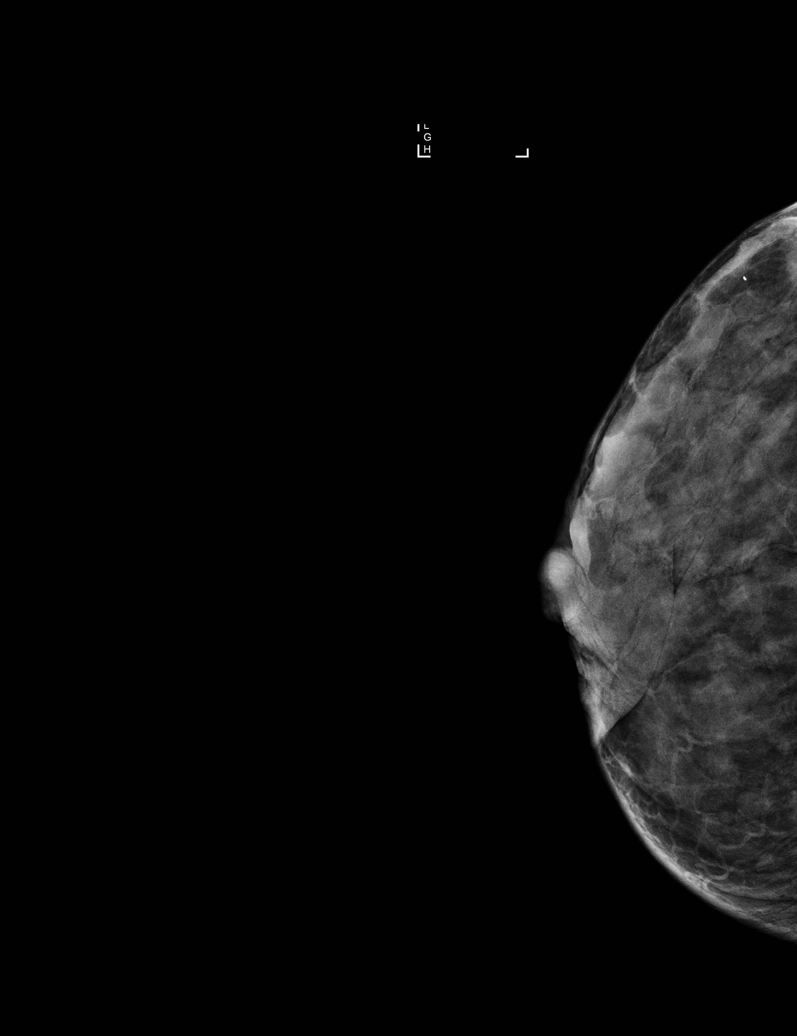

[L MLO (2 of 2)]
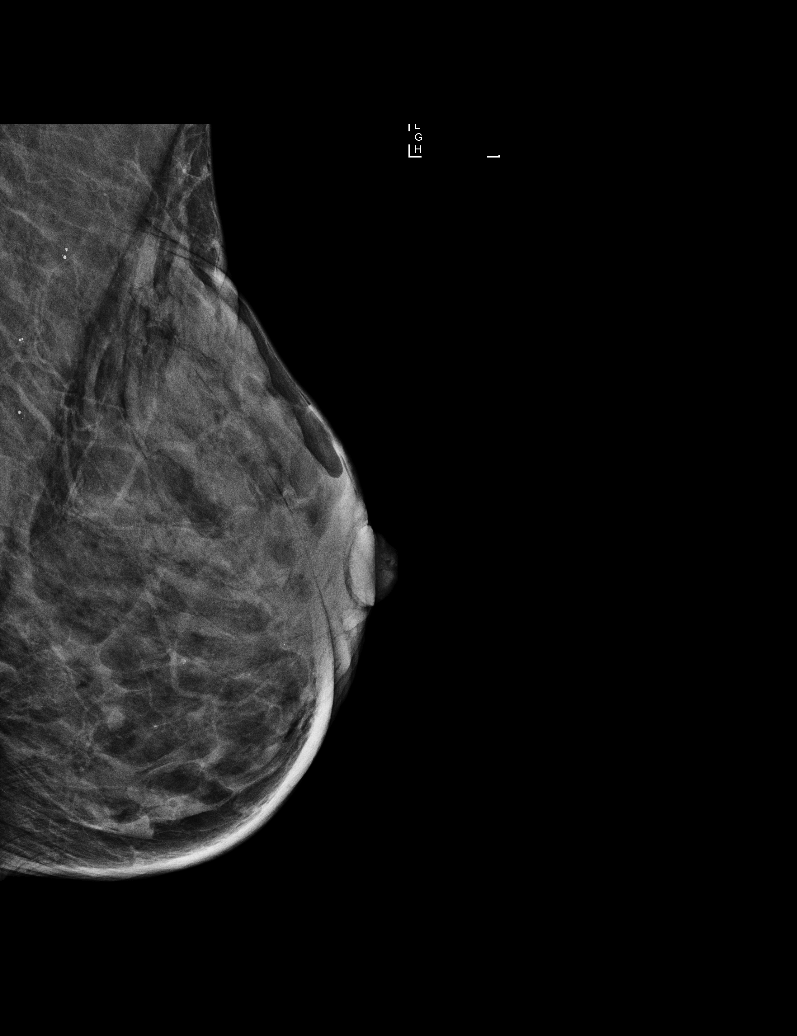

[6 of 6 positions shown; findings below may reference images not displayed]

ACR Breast Density Category d: The breast tissue is extremely dense,
which lowers the sensitivity of mammography.
FINDINGS: There are no findings suspicious for malignancy. Images were
processed with CAD.
IMPRESSION: No mammographic evidence of malignancy. A result letter of this
screening mammogram will be mailed directly to the patient.

RECOMMENDATION:
Screening mammogram in one year. (Code:[ZH])

BI-RADS CATEGORY  1: Negative.

## 2017-07-24 ENCOUNTER — Telehealth: Payer: Self-pay | Admitting: *Deleted

## 2017-07-24 DIAGNOSIS — M1612 Unilateral primary osteoarthritis, left hip: Secondary | ICD-10-CM

## 2017-07-24 MED ORDER — TRAMADOL HCL 50 MG PO TABS: 50.0000 mg | ORAL_TABLET | Freq: Two times a day (BID) | ORAL | 0 refills | Status: DC

## 2017-07-24 NOTE — Telephone Encounter (Signed)
Patient called to request Tramadol refill.  Called into [facility].  Patient aware of her appointment 8/9. Landis Gandy, RN

## 2017-07-26 ENCOUNTER — Ambulatory Visit: Payer: Medicaid Other | Admitting: Internal Medicine

## 2017-08-07 ENCOUNTER — Ambulatory Visit: Payer: Medicaid Other | Admitting: Internal Medicine

## 2017-08-22 ENCOUNTER — Ambulatory Visit (INDEPENDENT_AMBULATORY_CARE_PROVIDER_SITE_OTHER): Payer: Medicaid Other | Admitting: Pharmacist Clinician (PhC)/ Clinical Pharmacy Specialist

## 2017-08-22 ENCOUNTER — Other Ambulatory Visit: Payer: Self-pay | Admitting: Internal Medicine

## 2017-08-22 DIAGNOSIS — M1612 Unilateral primary osteoarthritis, left hip: Secondary | ICD-10-CM | POA: Diagnosis not present

## 2017-08-22 DIAGNOSIS — B20 Human immunodeficiency virus [HIV] disease: Secondary | ICD-10-CM

## 2017-08-22 MED ORDER — BICTEGRAVIR-EMTRICITAB-TENOFOV 50-200-25 MG PO TABS: 1.0000 | ORAL_TABLET | Freq: Every day | ORAL | 6 refills | Status: DC

## 2017-08-22 MED ORDER — TRAMADOL HCL 50 MG PO TABS: 50.0000 mg | ORAL_TABLET | Freq: Two times a day (BID) | ORAL | 0 refills | Status: DC | PRN

## 2017-08-22 MED ORDER — FLUTICASONE PROPIONATE 50 MCG/ACT NA SUSP: 1.0000 | Freq: Every day | NASAL | 2 refills | Status: DC

## 2017-08-22 MED FILL — FLUTICASONE PROP 50 MCG SPR: 50 | 30 days supply | Qty: 16 | Fill #0

## 2017-08-22 MED FILL — BIKTARVY 50-200-25 MG TABS: 50-200-25 | 30 days supply | Qty: 30 | Fill #0

## 2017-08-22 NOTE — Progress Notes (Signed)
HPI: Janice Williams is a 56 y.o. female who is here to see [place] after several no shows.   Allergies: Allergies  Allergen Reactions  . Chantix [Varenicline] Itching    Vitals:    Past Medical History: Past Medical History:  Diagnosis Date  . Arthritis   . HIV infection ([place])   . Hypertension   . Stroke ([place])   . Substance abuse    history, clean 7 years    Social History: Social History   Social History  . Marital status: Single    Spouse name: N/A  . Number of children: N/A  . Years of education: N/A   Social History Main Topics  . Smoking status: Former Smoker    Packs/day: 0.40    Types: Cigarettes    Start date: 12/18/2012  . Smokeless tobacco: Never Used  . Alcohol use 0.0 oz/week     Comment: "occasional"   . Drug use: No  . Sexual activity: Not Currently    Partners: Male     Comment: pt. given condoms   Other Topics Concern  . Not on file   Social History Narrative  . No narrative on file    Previous Regimen:   Current Regimen: Genvoya  Labs: HIV 1 RNA Quant (copies/mL)  Date Value  06/27/2017 <20 DETECTED (A)  04/09/2017 <20 DETECTED (A)  01/25/2017 46 (H)   CD4 T Cell Abs (/uL)  Date Value  06/27/2017 580  04/09/2017 790  01/25/2017 910   Hep B S Ab (no units)  Date Value  12/28/2014 NEG   Hepatitis B Surface Ag (no units)  Date Value  04/16/2008 NEG   HCV Ab (no units)  Date Value  04/16/2008 NEG    CrCl: CrCl cannot be calculated (Patient's most recent lab result is older than the maximum 21 days allowed.).  Lipids:    Component Value Date/Time   CHOL 287 (H) 01/25/2017 1055   TRIG 58 01/25/2017 1055   HDL 126 01/25/2017 1055   CHOLHDL 2.3 01/25/2017 1055   VLDL 12 01/25/2017 1055   LDLCALC 149 (H) 01/25/2017 1055    Assessment: Janice Williams has been doing well on her current ART Janice Williams). She walked in today to get more refills. She came in for labs in July and it was well suppressed. Her CD4 was in the normal range.  After going over her medication reconciliation, she is not on several meds due to coverage with medicaid (Beconase). We had to send it that way due to the interaction with her Genvoya. After discussing it with Dr. Baxter Flattery, we are going to change her to Janice Williams so we can put her on an inhaled steroid that is on the Janice Williams formulary. Both meds will be send to [facility] so they can mail them to her. She will f/u with Dr. Baxter Flattery in Dec. She will f/u with pharmacy in Oct for another HIV labs check.   Gave her another rx for tramadol. Dr. Baxter Flattery was here to sign it.   Recommendations:  Stop [employer] 1 daily Dc Beconase  Start Flonase 1 puff qday F/u with pharmacy in Oct F/u with Dr. Baxter Flattery in Dec  Marie Chow, PharmD, BCPS, [place], [place] for Infectious Disease 08/22/2017, 4:51 PM

## 2017-08-22 NOTE — Patient Instructions (Signed)
Stop [employer] 1 daily Start Flonase 1 inhalation nasally per day F/u with Dr. Baxter Flattery in Dec F/u with me in Oct

## 2017-08-28 ENCOUNTER — Ambulatory Visit: Payer: Medicaid Other | Admitting: Internal Medicine

## 2017-09-14 MED FILL — BIKTARVY 50-200-25 MG TABS: 50-200-25 | 30 days supply | Qty: 30 | Fill #1

## 2017-09-14 MED FILL — FLUTICASONE PROP 50 MCG SPR: 50 | 30 days supply | Qty: 16 | Fill #1

## 2017-09-20 ENCOUNTER — Telehealth: Payer: Self-pay | Admitting: *Deleted

## 2017-09-20 NOTE — Telephone Encounter (Signed)
Patient requesting refill of tramadol. Please advise. She has had many no-shows with MD, but is scheduled to follow up with pharmacy 10/22. Patient would prefer [facility] on [address]. Landis Gandy, RN

## 2017-09-24 ENCOUNTER — Other Ambulatory Visit: Payer: Self-pay | Admitting: *Deleted

## 2017-09-24 DIAGNOSIS — M1612 Unilateral primary osteoarthritis, left hip: Secondary | ICD-10-CM

## 2017-09-24 MED ORDER — TRAMADOL HCL 50 MG PO TABS: 50.0000 mg | ORAL_TABLET | Freq: Two times a day (BID) | ORAL | 0 refills | Status: DC | PRN

## 2017-09-24 NOTE — Telephone Encounter (Signed)
Patient called to see if she was able to refill Tramadol. Please advise.

## 2017-09-24 NOTE — Telephone Encounter (Signed)
Can refill tramadol  

## 2017-09-24 NOTE — Telephone Encounter (Signed)
Called in to her pharmacy of choice. Thanks!

## 2017-09-25 ENCOUNTER — Telehealth: Payer: Self-pay | Admitting: *Deleted

## 2017-09-25 NOTE — Telephone Encounter (Signed)
Patient shared that theTramadol rx needs PA through [facility].  She found this out when she contacted her pharmacy.  [facility] received PA paperwork from [facility].  Medicaid PA form completed, signed by Dr. Baxter Flattery and faxed to [facility].  Patient informed that PA paperwork was faxed this AM.

## 2017-10-08 ENCOUNTER — Ambulatory Visit (INDEPENDENT_AMBULATORY_CARE_PROVIDER_SITE_OTHER): Payer: Medicaid Other | Admitting: Pharmacist Clinician (PhC)/ Clinical Pharmacy Specialist

## 2017-10-08 VITALS — Temp 97.8°F

## 2017-10-08 DIAGNOSIS — B20 Human immunodeficiency virus [HIV] disease: Secondary | ICD-10-CM | POA: Diagnosis present

## 2017-10-08 MED ORDER — ONDANSETRON HCL 4 MG PO TABS: 4.0000 mg | ORAL_TABLET | Freq: Three times a day (TID) | ORAL | 0 refills | Status: DC | PRN

## 2017-10-08 NOTE — Progress Notes (Signed)
HPI: Janice Williams is a 56 y.o. female who is here to see pharmacy for her HIV labs follow up.   Allergies: Allergies  Allergen Reactions  . Chantix [Varenicline] Itching    Vitals: Temp: 97.8 F (36.6 C) (10/22 1014)  Past Medical History: Past Medical History:  Diagnosis Date  . Arthritis   . HIV infection ([place])   . Hypertension   . Stroke ([place])   . Substance abuse    history, clean 7 years    Social History: Social History   Social History  . Marital status: Single    Spouse name: N/A  . Number of children: N/A  . Years of education: N/A   Social History Main Topics  . Smoking status: Former Smoker    Packs/day: 0.40    Types: Cigarettes    Start date: 12/18/2012  . Smokeless tobacco: Never Used  . Alcohol use 0.0 oz/week     Comment: "occasional"   . Drug use: No  . Sexual activity: Not Currently    Partners: Male     Comment: pt. given condoms   Other Topics Concern  . Not on file   Social History Narrative  . No narrative on file    Previous Regimen: Genvoya  Current Regimen: Biktarvy  Labs: HIV 1 RNA Quant (copies/mL)  Date Value  06/27/2017 <20 DETECTED (A)  04/09/2017 <20 DETECTED (A)  01/25/2017 46 (H)   CD4 T Cell Abs (/uL)  Date Value  06/27/2017 580  04/09/2017 790  01/25/2017 910   Hep B S Ab (no units)  Date Value  12/28/2014 NEG   Hepatitis B Surface Ag (no units)  Date Value  04/16/2008 NEG   HCV Ab (no units)  Date Value  04/16/2008 NEG    CrCl: CrCl cannot be calculated (Patient's most recent lab result is older than the maximum 21 days allowed.).  Lipids:    Component Value Date/Time   CHOL 287 (H) 01/25/2017 1055   TRIG 58 01/25/2017 1055   HDL 126 01/25/2017 1055   CHOLHDL 2.3 01/25/2017 1055   VLDL 12 01/25/2017 1055   LDLCALC 149 (H) 01/25/2017 1055    Assessment: Janice Williams is doing very well on her Biktarvy. She likes it much better than Genvoya. While he was sitting in the waiting room today, she  stated that she felt like throwing up. She felt that way starting this AM. She is afebrile at this visit. No body aches. She stated that she has gotten the flu shot this year. [facility] for the nausea. Advised her that if she has any flu like symptoms, she can call back here. Other than the nausea issue, she is doing very well on her ART.   Recommendations:  Continue Biktarvy 1 PO daily HIV VL today F/u with Dr. Baxter Flattery in Dec Zofran 4mg  PO q8 PRN x20   Onnie Boer, PharmD, BCPS, AAHIVP, CPP Clinical Infectious [place] for Infectious Disease 10/08/2017, 11:41 AM

## 2017-10-10 LAB — HIV-1 RNA QUANT-NO REFLEX-BLD
HIV 1 RNA Quant: 107 {copies}/mL — ABNORMAL HIGH
HIV-1 RNA Quant, Log: 2.03 {Log_copies}/mL — ABNORMAL HIGH

## 2017-10-10 MED FILL — FLUTICASONE PROP 50 MCG SPR: 50 | 30 days supply | Qty: 16 | Fill #2

## 2017-10-10 MED FILL — BIKTARVY 50-200-25 MG TABS: 50-200-25 | 30 days supply | Qty: 30 | Fill #2

## 2017-10-22 ENCOUNTER — Other Ambulatory Visit: Payer: Self-pay | Admitting: Internal Medicine

## 2017-10-22 ENCOUNTER — Other Ambulatory Visit: Payer: Self-pay | Admitting: *Deleted

## 2017-10-22 DIAGNOSIS — R634 Abnormal weight loss: Secondary | ICD-10-CM

## 2017-10-22 DIAGNOSIS — M1612 Unilateral primary osteoarthritis, left hip: Secondary | ICD-10-CM

## 2017-10-22 MED ORDER — ENSURE PO LIQD: 237.0000 mL | Freq: Three times a day (TID) | ORAL | 5 refills | Status: DC

## 2017-11-06 MED FILL — BIKTARVY 50-200-25 MG TABS: 50-200-25 | 30 days supply | Qty: 30 | Fill #3

## 2017-11-13 ENCOUNTER — Other Ambulatory Visit: Payer: Self-pay | Admitting: Internal Medicine

## 2017-11-13 DIAGNOSIS — I10 Essential (primary) hypertension: Secondary | ICD-10-CM

## 2017-11-20 ENCOUNTER — Ambulatory Visit (INDEPENDENT_AMBULATORY_CARE_PROVIDER_SITE_OTHER): Payer: Medicaid Other | Admitting: Internal Medicine

## 2017-11-20 VITALS — BP 119/74 | HR 68 | Temp 98.1°F | Wt 97.0 lb

## 2017-11-20 DIAGNOSIS — B20 Human immunodeficiency virus [HIV] disease: Secondary | ICD-10-CM

## 2017-11-20 DIAGNOSIS — E43 Unspecified severe protein-calorie malnutrition: Secondary | ICD-10-CM | POA: Diagnosis not present

## 2017-11-20 DIAGNOSIS — R634 Abnormal weight loss: Secondary | ICD-10-CM

## 2017-11-20 NOTE — Progress Notes (Signed)
RFV: follow up for hiv disease  Patient ID: Janice Williams, female   DOB: 06-20-1961, 56 y.o.   MRN: [id number]  HPI Janice Williams is a 56yo F with hiv disease, Cd 4 count 580/VL 107.recently switched to biktarvy in sep/oct.   Received flu shot 2 months ago through church  Otherwise doing well. No recent illnesses Outpatient Encounter Medications as of 11/20/2017  Medication Sig  . albuterol (PROVENTIL HFA;VENTOLIN HFA) 108 (90 Base) MCG/ACT inhaler Inhale 2 puffs into the lungs every 6 (six) hours as needed for wheezing or shortness of breath.  [name] amLODipine (NORVASC) 10 MG tablet TAKE ONE TABLET BY MOUTH DAILY  . bictegravir-emtricitabine-tenofovir AF (BIKTARVY) 50-200-25 MG TABS tablet Take 1 tablet by mouth daily.  . calcium-vitamin D (OSCAL WITH D) 500-200 MG-UNIT tablet Take 1 tablet by mouth 2 (two) times daily.  [name] dronabinol (MARINOL) 5 MG capsule Take 1 capsule (5 mg total) by mouth 2 (two) times daily before a meal.  . ENSURE (ENSURE) Take 237 mLs 3 (three) times daily between meals by mouth.  . fluticasone (FLONASE) 50 MCG/ACT nasal spray Place 1 spray into both nostrils daily.  [name] loratadine (CLARITIN) 10 MG tablet Take 1 tablet (10 mg total) by mouth daily.  [name] omeprazole (PRILOSEC) 20 MG capsule TAKE 1 CAPSULE BY MOUTH EVERY DAY  . ondansetron (ZOFRAN) 4 MG tablet Take 1 tablet (4 mg total) by mouth every 8 (eight) hours as needed for nausea or vomiting.  . pravastatin (PRAVACHOL) 20 MG tablet Take 1 tablet (20 mg total) by mouth daily.  . sodium chloride (OCEAN) 0.65 % SOLN nasal spray Place 2 sprays into both nostrils 3 (three) times daily.  . traMADol (ULTRAM) 50 MG tablet TAKE 1 TABLET BY MOUTH EVERY 12 HOURS AS NEEDED  . Vitamin D, Ergocalciferol, (DRISDOL) 50000 units CAPS capsule TAKE 1 CAPSULE BY MOUTH EVERY 7 DAYS  . Witch Hazel (TUCKS) 50 % PADS Use as needed for rectal itching, after having BM  . alendronate (FOSAMAX) 70 MG tablet TAKE 1 TABLET BY MOUTH ONCE A WEEK. TAKE  WITH A FULL GLASS OF WATER ON EMPTY STOMACH (Patient not taking: Reported on 08/22/2017)  . polyethylene glycol (MIRALAX / GLYCOLAX) packet Take one capful in 8 oz of fluid of your choice twice a day until having soft stools, then back down to once a day.  If not having soft stools with twice a day, increased to three times a day (Patient taking differently: Take 17 g by mouth 2 (two) times daily as needed for mild constipation. )  . pramoxine (PROCTOFOAM) 1 % foam Place 1 application rectally 3 (three) times daily as needed for itching. (Patient not taking: Reported on 11/20/2017)   No facility-administered encounter medications on file as of 11/20/2017.      Patient Active Problem List   Diagnosis Date Noted  . COPD with acute exacerbation ([place]) 06/09/2017  . Sinusitis 06/08/2017  . Hypertension 06/08/2017  . Dental abscess 03/15/2015  . Knee pain, bilateral 03/15/2015  . Acne cystica 12/22/2011  . Dyslipidemia 07/06/2011  . Routine health maintenance 07/06/2011  . EPIDERMOID CYST 02/10/2011  . ACUTE SINUSITIS, UNSPECIFIED 01/20/2010  . EXTERNAL OTITIS 01/06/2010  . Hyperlipidemia 06/03/2009  . WEIGHT LOSS 06/03/2009  . CARBUNCLE AND FURUNCLE OF FACE 02/04/2009  . FACIAL RASH 02/04/2009  . Human immunodeficiency virus (HIV) disease ([place]) 10/29/2008  . ANEMIA-NOS 10/29/2008  . PERIPHERAL NEUROPATHY 10/29/2008  . GERD 10/29/2008  . RENAL INSUFFICIENCY 10/29/2008  . CEREBROVASCULAR  ACCIDENT, HX OF 10/29/2008  . TOBACCO DEPENDENCE 02/14/2007  . CVA 02/14/2007  . RHINITIS, ALLERGIC 02/14/2007  . DYSPEPSIA 02/14/2007  . ACNE 02/14/2007  . WEIGHT LOSS, ABNORMAL 02/14/2007     Health Maintenance Due  Topic Date Due  . COLONOSCOPY  08/28/2010  . PAP SMEAR  06/09/2017  . INFLUENZA VACCINE  07/18/2017     Review of Systems  Constitutional: Negative for fever, chills, diaphoresis, activity change, appetite change, fatigue and unexpected weight change.  HENT: Negative for  congestion, sore throat, rhinorrhea, sneezing, trouble swallowing and sinus pressure.  Eyes: Negative for photophobia and visual disturbance.  Respiratory: Negative for cough, chest tightness, shortness of breath, wheezing and stridor.  Cardiovascular: Negative for chest pain, palpitations and leg swelling.  Gastrointestinal: Negative for nausea, vomiting, abdominal pain, diarrhea, constipation, blood in stool, abdominal distention and anal bleeding.  Genitourinary: Negative for dysuria, hematuria, flank pain and difficulty urinating.  Musculoskeletal: Negative for myalgias, back pain, joint swelling, arthralgias and gait problem.  Skin: Negative for color change, pallor, rash and wound.  Neurological: Negative for dizziness, tremors, weakness and light-headedness.  Hematological: Negative for adenopathy. Does not bruise/bleed easily.  Psychiatric/Behavioral: Negative for behavioral problems, confusion, sleep disturbance, dysphoric mood, decreased concentration and agitation.    Physical Exam   BP 119/74   Pulse 68   Temp 98.1 F (36.7 C) (Oral)   Wt 97 lb (44 kg)   BMI 17.18 kg/m   Physical Exam  Constitutional:  oriented to person, place, and time. appears under-nourished. No distress.  HENT: [place]/AT, PERRLA, no scleral icterus Mouth/Throat: Oropharynx is clear and moist. No oropharyngeal exudate.  Cardiovascular: Normal rate, regular rhythm and normal heart sounds. Exam reveals no gallop and no friction rub.  No murmur heard.  Pulmonary/Chest: Effort normal and breath sounds normal. No respiratory distress.  has no wheezes.  Neck = supple, no nuchal rigidity Abdominal: Soft. Bowel sounds are normal.  exhibits no distension. There is no tenderness.  Lymphadenopathy: no cervical adenopathy. No axillary adenopathy Neurological: alert and oriented to person, place, and time.  Skin: Skin is warm and dry. No rash noted. No erythema.  Psychiatric: a normal mood and affect.  behavior is  normal.   Lab Results  Component Value Date   CD4TCELL 39 06/27/2017   Lab Results  Component Value Date   CD4TABS 580 06/27/2017   CD4TABS 790 04/09/2017   CD4TABS 910 01/25/2017   Lab Results  Component Value Date   HIV1RNAQUANT 107 (H) 10/08/2017   Lab Results  Component Value Date   HEPBSAB NEG 12/28/2014   Lab Results  Component Value Date   LABRPR NON REAC 04/09/2017    CBC Lab Results  Component Value Date   WBC 4.3 06/27/2017   RBC 3.30 (L) 06/27/2017   HGB 11.0 (L) 06/27/2017   HCT 32.9 (L) 06/27/2017   PLT 306 06/27/2017   MCV 99.7 06/27/2017   MCH 33.3 (H) 06/27/2017   MCHC 33.4 06/27/2017   RDW 15.2 (H) 06/27/2017   LYMPHSABS 1,462 06/27/2017   MONOABS 258 06/27/2017   EOSABS 86 06/27/2017    BMET Lab Results  Component Value Date   NA 138 06/27/2017   K 4.3 06/27/2017   CL 101 06/27/2017   CO2 25 06/27/2017   GLUCOSE 83 06/27/2017   BUN 16 06/27/2017   CREATININE 0.91 06/27/2017   CALCIUM 9.4 06/27/2017   GFRNONAA 71 06/27/2017   GFRAA 82 06/27/2017    Assessment and Plan  hiv disease =  has low leve viremia. will check labs to see if virologically controlled. Continue with biktarvy  Health maintenance = has had her flu shot. No other vaccines noted  Underweight = gave supplementation coupons (boost, carnation drink) through clinic

## 2017-11-21 LAB — T-HELPER CELL (CD4) - (RCID CLINIC ONLY)
CD4 % Helper T Cell: 34 % (ref 33–55)
CD4 T Cell Abs: 650 /uL (ref 400–2700)

## 2017-11-22 LAB — HIV-1 RNA QUANT-NO REFLEX-BLD
HIV 1 RNA Quant: <20 copies/mL — AB
HIV-1 RNA Quant, Log: <1.3 Log copies/mL — AB

## 2017-11-29 ENCOUNTER — Other Ambulatory Visit: Payer: Self-pay | Admitting: Internal Medicine

## 2017-11-29 MED FILL — FLUTICASONE PROP 50 MCG SPR: 50 | 30 days supply | Qty: 16 | Fill #0

## 2017-12-04 MED FILL — BIKTARVY 50-200-25 MG TABS: 50-200-25 | 30 days supply | Qty: 30 | Fill #4

## 2017-12-05 ENCOUNTER — Other Ambulatory Visit: Payer: Self-pay | Admitting: Internal Medicine

## 2017-12-05 DIAGNOSIS — E785 Hyperlipidemia, unspecified: Secondary | ICD-10-CM

## 2017-12-05 DIAGNOSIS — K2100 Gastro-esophageal reflux disease with esophagitis, without bleeding: Secondary | ICD-10-CM

## 2017-12-18 HISTORY — PX: COLONOSCOPY: SHX174

## 2017-12-26 MED FILL — BIKTARVY 50-200-25 MG TABS: 50-200-25 | 30 days supply | Qty: 30 | Fill #5

## 2017-12-28 ENCOUNTER — Other Ambulatory Visit: Payer: Self-pay | Admitting: Internal Medicine

## 2017-12-28 DIAGNOSIS — M1612 Unilateral primary osteoarthritis, left hip: Secondary | ICD-10-CM

## 2018-01-08 ENCOUNTER — Other Ambulatory Visit: Payer: Self-pay | Admitting: *Deleted

## 2018-01-08 ENCOUNTER — Other Ambulatory Visit: Payer: Self-pay | Admitting: Internal Medicine

## 2018-01-08 MED ORDER — ALBUTEROL SULFATE HFA 108 (90 BASE) MCG/ACT IN AERS: 2.0000 | INHALATION_SPRAY | Freq: Four times a day (QID) | RESPIRATORY_TRACT | 5 refills | Status: DC | PRN

## 2018-01-11 MED FILL — FLUTICASONE PROP 50 MCG SPR: 50 | 30 days supply | Qty: 16 | Fill #1

## 2018-01-21 MED FILL — BIKTARVY 50-200-25 MG TABS: 50-200-25 | 30 days supply | Qty: 30 | Fill #6

## 2018-01-31 ENCOUNTER — Ambulatory Visit: Payer: Medicaid Other | Admitting: Infectious Diseases

## 2018-01-31 ENCOUNTER — Other Ambulatory Visit: Payer: Self-pay | Admitting: *Deleted

## 2018-01-31 ENCOUNTER — Other Ambulatory Visit: Payer: Medicaid Other

## 2018-01-31 DIAGNOSIS — B20 Human immunodeficiency virus [HIV] disease: Secondary | ICD-10-CM

## 2018-02-01 ENCOUNTER — Other Ambulatory Visit: Payer: Medicaid Other

## 2018-02-01 DIAGNOSIS — B20 Human immunodeficiency virus [HIV] disease: Secondary | ICD-10-CM

## 2018-02-01 LAB — COMPLETE METABOLIC PANEL WITH GFR
AG Ratio: 1.8 (calc) (ref 1.0–2.5)
ALT: 5 U/L — ABNORMAL LOW (ref 6–29)
AST: 14 U/L (ref 10–35)
Albumin: 4.4 g/dL (ref 3.6–5.1)
Alkaline phosphatase (APISO): 55 U/L (ref 33–130)
BUN/Creatinine Ratio: 13 (calc) (ref 6–22)
BUN: 15 mg/dL (ref 7–25)
CO2: 26 mmol/L (ref 20–32)
Calcium: 9.7 mg/dL (ref 8.6–10.4)
Chloride: 104 mmol/L (ref 98–110)
Creat: 1.13 mg/dL — ABNORMAL HIGH (ref 0.50–1.05)
GFR, Est African American: 62 mL/min/{1.73_m2} (ref >=60)
GFR, Est Non African American: 54 mL/min/{1.73_m2} — ABNORMAL LOW (ref >=60)
Globulin: 2.4 g/dL (calc) (ref 1.9–3.7)
Glucose, Bld: 86 mg/dL (ref 65–99)
Potassium: 4.5 mmol/L (ref 3.5–5.3)
Sodium: 139 mmol/L (ref 135–146)
Total Bilirubin: 0.6 mg/dL (ref 0.2–1.2)
Total Protein: 6.8 g/dL (ref 6.1–8.1)

## 2018-02-01 LAB — CBC WITH DIFFERENTIAL/PLATELET
Basophils Absolute: 11 cells/uL (ref 0–200)
Basophils Relative: 0.3 %
Eosinophils Absolute: 41 cells/uL (ref 15–500)
Eosinophils Relative: 1.1 %
HCT: 36.6 % (ref 35.0–45.0)
Hemoglobin: 12.6 g/dL (ref 11.7–15.5)
Lymphs Abs: 1917 cells/uL (ref 850–3900)
MCH: 32.6 pg (ref 27.0–33.0)
MCHC: 34.4 g/dL (ref 32.0–36.0)
MCV: 94.8 fL (ref 80.0–100.0)
MPV: 10.1 fL (ref 7.5–12.5)
Monocytes Relative: 9.9 %
Neutro Abs: 1365 cells/uL — ABNORMAL LOW (ref 1500–7800)
Neutrophils Relative %: 36.9 %
Platelets: 262 10*3/uL (ref 140–400)
RBC: 3.86 10*6/uL (ref 3.80–5.10)
RDW: 12.4 % (ref 11.0–15.0)
Total Lymphocyte: 51.8 %
WBC mixed population: 366 cells/uL (ref 200–950)
WBC: 3.7 10*3/uL — ABNORMAL LOW (ref 3.8–10.8)

## 2018-02-01 LAB — T-HELPER CELL (CD4) - (RCID CLINIC ONLY)
CD4 % Helper T Cell: 39 % (ref 33–55)
CD4 T Cell Abs: 830 /uL (ref 400–2700)

## 2018-02-05 ENCOUNTER — Other Ambulatory Visit: Payer: Medicaid Other

## 2018-02-05 LAB — HIV-1 RNA QUANT-NO REFLEX-BLD
HIV 1 RNA Quant: 34 copies/mL — ABNORMAL HIGH
HIV-1 RNA Quant, Log: 1.53 Log copies/mL — ABNORMAL HIGH

## 2018-02-14 ENCOUNTER — Other Ambulatory Visit: Payer: Self-pay | Admitting: Internal Medicine

## 2018-02-18 MED FILL — BIKTARVY 50-200-25 MG TABS: 50-200-25 | 30 days supply | Qty: 30 | Fill #0

## 2018-02-18 MED FILL — FLUTICASONE PROP 50 MCG SPR: 50 | 30 days supply | Qty: 16 | Fill #2

## 2018-02-19 ENCOUNTER — Ambulatory Visit (INDEPENDENT_AMBULATORY_CARE_PROVIDER_SITE_OTHER): Payer: Medicaid Other | Admitting: Infectious Diseases

## 2018-02-19 ENCOUNTER — Other Ambulatory Visit (HOSPITAL_COMMUNITY)
Admission: RE | Admit: 2018-02-19 | Discharge: 2018-02-19 | Disposition: A | Discharge status: Home / Self Care | Payer: Medicaid Other | Source: Ambulatory Visit | Attending: Infectious Diseases | Admitting: Infectious Diseases

## 2018-02-19 ENCOUNTER — Encounter: Payer: Self-pay | Admitting: Internal Medicine

## 2018-02-19 ENCOUNTER — Ambulatory Visit (INDEPENDENT_AMBULATORY_CARE_PROVIDER_SITE_OTHER): Payer: Medicaid Other | Admitting: Internal Medicine

## 2018-02-19 VITALS — BP 131/83 | HR 56 | Temp 98.1°F | Ht 63.0 in | Wt 97.0 lb

## 2018-02-19 DIAGNOSIS — B20 Human immunodeficiency virus [HIV] disease: Secondary | ICD-10-CM | POA: Diagnosis not present

## 2018-02-19 DIAGNOSIS — Z79899 Other long term (current) drug therapy: Secondary | ICD-10-CM | POA: Diagnosis not present

## 2018-02-19 DIAGNOSIS — E43 Unspecified severe protein-calorie malnutrition: Secondary | ICD-10-CM

## 2018-02-19 DIAGNOSIS — Z124 Encounter for screening for malignant neoplasm of cervix: Secondary | ICD-10-CM

## 2018-02-19 NOTE — Progress Notes (Signed)
     Subjective:    Janice Williams is a 57 y.o. female here for an annual pelvic exam and pap smear.   Review of Systems: Current GYN complaints or concerns: none.  Patient denies any abdominal/pelvic pain, problems with bowel movements, urination, vaginal discharge or intercourse.   Past Medical History:  Diagnosis Date  . Arthritis   . HIV infection ([place])   . Hypertension   . Stroke ([place])   . Substance abuse ([place])    history, clean 7 years    Gynecologic History: No obstetric history on file.  No LMP recorded. Patient is postmenopausal. Contraception: abstinence Last Pap: 2017. Results were: normal Anal Intercourse: no Last Mammogram: 07/2017. Results were: normal  Objective:  Physical Exam  Constitutional: Well developed, well nourished, no acute distress. She is alert and oriented x3.  Pelvic: External genitalia is normal in appearance. The vagina is normal in appearance. The cervix is bulbous and easily visualized. No CMT, normal expected cervical mucus present. Bimanual exam reveals uterus that is felt to be normal size, shape, and contour. No adnexal masses or tenderness noted. Breasts: symmetrical in contour, shape and texture. No palpable masses/nodules. No nipple discharge.  Psych: She has a normal mood and affect.    Assessment:  Normal pelvic and bimanual exam. Thin prep pap was obtained and sent for cytology with reflex HPV and GC/C today. Normal clinical breast exam.   Plan:  Health Maintenance =   Return in 1 year for annual pap screening unless indicated sooner.   She has been counseled and instructed how to perform monthly self breast exams.  Screening mammogram up to date   Contraception / Family Planning =   Post-menopausal   HIV =   She will continue her Biktarvy and F/U as scheduled with Dr. Baxter Flattery for ongoing HIV care.   [name], MSN, NP-C [facility] 02/19/2018 10:59 AM

## 2018-02-19 NOTE — Patient Instructions (Signed)
Labs 2 weeks prior to next visit

## 2018-02-19 NOTE — Progress Notes (Signed)
RFV: follow up for hiv disease  Patient ID: Janice Williams, female   DOB: 1961/03/15, 57 y.o.   MRN: [id number]  HPI 57yo F with hiv disease, well controlled. Cd 4 count 830/VL 34. Currently doing well on biktarvy. She reports being in good health. No recent health issues.  Outpatient Encounter Medications as of 02/19/2018  Medication Sig  . amLODipine (NORVASC) 10 MG tablet TAKE ONE TABLET BY MOUTH DAILY  . BIKTARVY 50-200-25 MG TABS tablet TAKE 1 TABLET BY MOUTH DAILY.  . calcium-vitamin D (OSCAL WITH D) 500-200 MG-UNIT tablet Take 1 tablet by mouth 2 (two) times daily.  [name] ENSURE (ENSURE) Take 237 mLs 3 (three) times daily between meals by mouth.  . fluticasone (FLONASE) 50 MCG/ACT nasal spray INSTILL 1 SPRAY INTO BOTH NOSTRILS DAILY  . loratadine (CLARITIN) 10 MG tablet Take 1 tablet (10 mg total) by mouth daily.  [name] omeprazole (PRILOSEC) 20 MG capsule TAKE 1 CAPSULE BY MOUTH EVERY DAY  . pravastatin (PRAVACHOL) 20 MG tablet TAKE 1 TABLET BY MOUTH EVERY DAY  . traMADol (ULTRAM) 50 MG tablet TAKE 1 TABLET BY MOUTH EVERY 12 HOURS AS NEEDED  . Witch Hazel (TUCKS) 50 % PADS Use as needed for rectal itching, after having BM  . albuterol (PROVENTIL HFA;VENTOLIN HFA) 108 (90 Base) MCG/ACT inhaler Inhale 2 puffs into the lungs every 6 (six) hours as needed for wheezing or shortness of breath.  [name] alendronate (FOSAMAX) 70 MG tablet TAKE 1 TABLET BY MOUTH ONCE A WEEK. TAKE WITH A FULL GLASS OF WATER ON EMPTY STOMACH (Patient not taking: Reported on 08/22/2017)  . dronabinol (MARINOL) 5 MG capsule Take 1 capsule (5 mg total) by mouth 2 (two) times daily before a meal. (Patient not taking: Reported on 02/19/2018)  . ondansetron (ZOFRAN) 4 MG tablet Take 1 tablet (4 mg total) by mouth every 8 (eight) hours as needed for nausea or vomiting.  . polyethylene glycol (MIRALAX / GLYCOLAX) packet Take one capful in 8 oz of fluid of your choice twice a day until having soft stools, then back down to once a day.  If  not having soft stools with twice a day, increased to three times a day (Patient taking differently: Take 17 g by mouth 2 (two) times daily as needed for mild constipation. )  . pramoxine (PROCTOFOAM) 1 % foam Place 1 application rectally 3 (three) times daily as needed for itching. (Patient not taking: Reported on 11/20/2017)  . sodium chloride (OCEAN) 0.65 % SOLN nasal spray Place 2 sprays into both nostrils 3 (three) times daily.  . Vitamin D, Ergocalciferol, (DRISDOL) 50000 units CAPS capsule TAKE 1 CAPSULE BY MOUTH EVERY 7 DAYS   No facility-administered encounter medications on file as of 02/19/2018.      Patient Active Problem List   Diagnosis Date Noted  . COPD with acute exacerbation ([place]) 06/09/2017  . Sinusitis 06/08/2017  . Hypertension 06/08/2017  . Dental abscess 03/15/2015  . Knee pain, bilateral 03/15/2015  . Acne cystica 12/22/2011  . Dyslipidemia 07/06/2011  . Routine health maintenance 07/06/2011  . EPIDERMOID CYST 02/10/2011  . ACUTE SINUSITIS, UNSPECIFIED 01/20/2010  . EXTERNAL OTITIS 01/06/2010  . Hyperlipidemia 06/03/2009  . WEIGHT LOSS 06/03/2009  . CARBUNCLE AND FURUNCLE OF FACE 02/04/2009  . FACIAL RASH 02/04/2009  . Human immunodeficiency virus (HIV) disease ([place]) 10/29/2008  . ANEMIA-NOS 10/29/2008  . PERIPHERAL NEUROPATHY 10/29/2008  . GERD 10/29/2008  . RENAL INSUFFICIENCY 10/29/2008  . CEREBROVASCULAR ACCIDENT, HX OF 10/29/2008  .  TOBACCO DEPENDENCE 02/14/2007  . CVA 02/14/2007  . RHINITIS, ALLERGIC 02/14/2007  . DYSPEPSIA 02/14/2007  . ACNE 02/14/2007  . WEIGHT LOSS, ABNORMAL 02/14/2007     Health Maintenance Due  Topic Date Due  . COLONOSCOPY  08/28/2010  . PAP SMEAR  06/09/2017     Review of Systems Review of Systems  Constitutional: Negative for fever, chills, diaphoresis, activity change, appetite change, fatigue and unexpected weight change.  HENT: Negative for congestion, sore throat, rhinorrhea, sneezing, trouble swallowing and  sinus pressure.  Eyes: Negative for photophobia and visual disturbance.  Respiratory: Negative for cough, chest tightness, shortness of breath, wheezing and stridor.  Cardiovascular: Negative for chest pain, palpitations and leg swelling.  Gastrointestinal: Negative for nausea, vomiting, abdominal pain, diarrhea, constipation, blood in stool, abdominal distention and anal bleeding.  Genitourinary: Negative for dysuria, hematuria, flank pain and difficulty urinating.  Musculoskeletal: Negative for myalgias, back pain, joint swelling, arthralgias and gait problem.  Skin: Negative for color change, pallor, rash and wound.  Neurological: Negative for dizziness, tremors, weakness and light-headedness.  Hematological: Negative for adenopathy. Does not bruise/bleed easily.  Psychiatric/Behavioral: Negative for behavioral problems, confusion, sleep disturbance, dysphoric mood, decreased concentration and agitation.    Physical Exam   BP 131/83   Pulse (!) 56   Temp 98.1 F (36.7 C) (Oral)   Ht 5\' 3"  (1.6 m)   Wt 97 lb (44 kg)   BMI 17.18 kg/m   Physical Exam  Constitutional:  oriented to person, place, and time. appears well-developed and well-nourished. No distress.  HENT: [place]/AT, PERRLA, no scleral icterus Mouth/Throat: Oropharynx is clear and moist. No oropharyngeal exudate.  Cardiovascular: Normal rate, regular rhythm and normal heart sounds. Exam reveals no gallop and no friction rub.  No murmur heard.  Pulmonary/Chest: Effort normal and breath sounds normal. No respiratory distress.  has no wheezes.  Lymphadenopathy: no cervical adenopathy. No axillary adenopathy Neurological: alert and oriented to person, place, and time.  Skin: Skin is warm and dry. No rash noted. No erythema.  Psychiatric: a normal mood and affect.  behavior is normal.   Lab Results  Component Value Date   CD4TCELL 39 02/01/2018   Lab Results  Component Value Date   CD4TABS 830 02/01/2018   CD4TABS 650  11/20/2017   CD4TABS 580 06/27/2017   Lab Results  Component Value Date   HIV1RNAQUANT 34 (H) 02/01/2018   Lab Results  Component Value Date   HEPBSAB NEG 12/28/2014   Lab Results  Component Value Date   LABRPR NON REAC 04/09/2017    CBC Lab Results  Component Value Date   WBC 3.7 (L) 02/01/2018   RBC 3.86 02/01/2018   HGB 12.6 02/01/2018   HCT 36.6 02/01/2018   PLT 262 02/01/2018   MCV 94.8 02/01/2018   MCH 32.6 02/01/2018   MCHC 34.4 02/01/2018   RDW 12.4 02/01/2018   LYMPHSABS 1,917 02/01/2018   MONOABS 258 06/27/2017   EOSABS 41 02/01/2018    BMET Lab Results  Component Value Date   NA 139 02/01/2018   K 4.5 02/01/2018   CL 104 02/01/2018   CO2 26 02/01/2018   GLUCOSE 86 02/01/2018   BUN 15 02/01/2018   CREATININE 1.13 (H) 02/01/2018   CALCIUM 9.7 02/01/2018   GFRNONAA 54 (L) 02/01/2018   GFRAA 62 02/01/2018      Assessment and Plan  hiv disease = continue on biktarvy. Well controlled on hiv disease  Long term medication = kidney function stable. No need to  change regimen at this time  Health maintenance = need to arrange for colonoscopy  Severe protein malnutrition = will give ensure prescription

## 2018-02-20 LAB — CYTOLOGY - PAP
Chlamydia: NEGATIVE
Diagnosis: NEGATIVE
Neisseria Gonorrhea: NEGATIVE

## 2018-02-21 ENCOUNTER — Encounter: Payer: Self-pay | Admitting: Infectious Diseases

## 2018-03-12 ENCOUNTER — Other Ambulatory Visit: Payer: Self-pay | Admitting: Internal Medicine

## 2018-03-14 MED FILL — BIKTARVY 50-200-25 MG TABS: 50-200-25 | 30 days supply | Qty: 30 | Fill #1

## 2018-03-14 MED FILL — FLUTICASONE PROP 50 MCG SPR: 50 | 60 days supply | Qty: 16 | Fill #0

## 2018-03-29 ENCOUNTER — Telehealth: Payer: Self-pay

## 2018-03-29 ENCOUNTER — Other Ambulatory Visit: Payer: Self-pay | Admitting: Internal Medicine

## 2018-03-29 DIAGNOSIS — M1612 Unilateral primary osteoarthritis, left hip: Secondary | ICD-10-CM

## 2018-03-29 NOTE — Telephone Encounter (Signed)
Patient called requesting refill of Tramadol. Please advise.

## 2018-03-29 NOTE — Telephone Encounter (Signed)
You can refill the tramadol for her

## 2018-04-01 ENCOUNTER — Telehealth: Payer: Self-pay | Admitting: Behavioral Health

## 2018-04-01 DIAGNOSIS — M1612 Unilateral primary osteoarthritis, left hip: Secondary | ICD-10-CM

## 2018-04-01 NOTE — Telephone Encounter (Signed)
Patient called to see if her Tramadol rx has been sent in.  Writer informed her it had been but needs Prior Authorization.    Writer Called Number on the form to initiate PA but representative states it cannot be done over the phone.  [place] tracks formed filled out and will be given to Dr. Baxter Flattery tomorrow to review and sign.    Patient called back and made aware of the process.  Patient verbalized understanding Pricilla Riffle RN

## 2018-04-02 ENCOUNTER — Telehealth: Payer: Self-pay

## 2018-04-02 NOTE — Telephone Encounter (Signed)
Patient called inquiring about prior authorization for tramadol.  She was made aware there was paperwork that had to be filled out by Dr. Baxter Flattery and faxed to [facility] tracks for denial or approval.  Form [id number] was faxed to [phone].  Ms. Barbary made aware that she will get a call in a few days after this information is reviewed and processed. Patient verbalized understanding. Pricilla Riffle RN

## 2018-04-02 NOTE — Telephone Encounter (Signed)
Error

## 2018-04-03 NOTE — Telephone Encounter (Signed)
Patient called today again to check the status of her Tramadol PA.  Writer informed her that a fax has not been sent yet for a decision.

## 2018-04-10 MED ORDER — TRAMADOL HCL 50 MG PO TABS: 50.0000 mg | ORAL_TABLET | Freq: Two times a day (BID) | ORAL | 0 refills | Status: DC | PRN

## 2018-04-10 NOTE — Addendum Note (Signed)
Addended by: Alberteen Sam on: 04/10/2018 09:14 AM   Modules accepted: Orders

## 2018-04-10 NOTE — Telephone Encounter (Addendum)
Patient called to see about Tramadol.  Let her know prescription was sent to [facility] however it is not covered by [employer] and PA was denied times 2.  Per doctor Baxter Flattery she can get the medication but will have to pay for it.  Patient verbalized understanding an ok with this.  RX called into the pharmacy [facility]. Pricilla Riffle RN

## 2018-04-16 NOTE — Telephone Encounter (Signed)
Patient again told that she was denied and she has to pay out of pocket as we have appealed the denial 2 times and been denied each time. She advised she understands this and will either pay or do without.

## 2018-04-19 MED FILL — BIKTARVY 50-200-25 MG TABS: 50-200-25 | 30 days supply | Qty: 30 | Fill #2

## 2018-05-02 ENCOUNTER — Other Ambulatory Visit: Payer: Self-pay | Admitting: Internal Medicine

## 2018-05-02 DIAGNOSIS — I10 Essential (primary) hypertension: Secondary | ICD-10-CM

## 2018-05-07 ENCOUNTER — Other Ambulatory Visit: Payer: Self-pay

## 2018-05-07 ENCOUNTER — Telehealth: Payer: Self-pay

## 2018-05-07 DIAGNOSIS — M1612 Unilateral primary osteoarthritis, left hip: Secondary | ICD-10-CM

## 2018-05-07 MED ORDER — TRAMADOL HCL 50 MG PO TABS: 50.0000 mg | ORAL_TABLET | Freq: Two times a day (BID) | ORAL | 0 refills | Status: DC | PRN

## 2018-05-07 NOTE — Telephone Encounter (Signed)
Pt called today requesting a refill for tramadol stated that she would like for the prescription to be sent to [facility] on [address]. Was able to this electronically for the pt. Pt is aware that the medication will be an out of pocket expense and is okay with this.  [place]

## 2018-05-07 NOTE — Telephone Encounter (Signed)
Prescription was called into pharmacy pharmacist was able to take verbal order and get the medication ready for the pt to pick up. Onset

## 2018-05-17 ENCOUNTER — Other Ambulatory Visit: Payer: Self-pay | Admitting: *Deleted

## 2018-05-17 DIAGNOSIS — R634 Abnormal weight loss: Secondary | ICD-10-CM

## 2018-05-17 MED ORDER — ENSURE PO LIQD: 237.0000 mL | Freq: Three times a day (TID) | ORAL | 5 refills | Status: DC

## 2018-05-20 ENCOUNTER — Telehealth: Payer: Self-pay | Admitting: Behavioral Health

## 2018-05-20 NOTE — Telephone Encounter (Signed)
Patient called for refill of Ensure.  New printed prescription given to [facility] in [place]. Pricilla Riffle RN

## 2018-05-23 MED FILL — FLUTICASONE PROP 50 MCG SPR: 50 | 60 days supply | Qty: 16 | Fill #1

## 2018-05-23 MED FILL — BIKTARVY 50-200-25 MG TABS: 50-200-25 | 30 days supply | Qty: 30 | Fill #3

## 2018-05-29 ENCOUNTER — Telehealth: Payer: Self-pay | Admitting: *Deleted

## 2018-05-29 DIAGNOSIS — E440 Moderate protein-calorie malnutrition: Secondary | ICD-10-CM

## 2018-05-29 NOTE — Telephone Encounter (Signed)
Weight /BMI 02/19/2018  WEIGHT 97 lb  HEIGHT 5\' 3"   BMI 17.18 kg/m2  Patient will need a diagnosis related to malnutrition or weight loss on their problem list in order to continue on Ensure via [facility]. Please advise. Landis Gandy, RN

## 2018-05-30 ENCOUNTER — Other Ambulatory Visit: Payer: Self-pay | Admitting: Internal Medicine

## 2018-05-30 DIAGNOSIS — K2100 Gastro-esophageal reflux disease with esophagitis, without bleeding: Secondary | ICD-10-CM

## 2018-05-30 DIAGNOSIS — E785 Hyperlipidemia, unspecified: Secondary | ICD-10-CM

## 2018-05-31 DIAGNOSIS — E440 Moderate protein-calorie malnutrition: Secondary | ICD-10-CM | POA: Insufficient documentation

## 2018-05-31 NOTE — Telephone Encounter (Signed)
Moderate protein calorie malnutrition

## 2018-06-03 ENCOUNTER — Telehealth: Payer: Self-pay

## 2018-06-03 ENCOUNTER — Other Ambulatory Visit: Payer: Self-pay

## 2018-06-03 DIAGNOSIS — M1612 Unilateral primary osteoarthritis, left hip: Secondary | ICD-10-CM

## 2018-06-03 MED ORDER — TRAMADOL HCL 50 MG PO TABS: 50.0000 mg | ORAL_TABLET | Freq: Two times a day (BID) | ORAL | 0 refills | Status: DC | PRN

## 2018-06-03 NOTE — Telephone Encounter (Signed)
PT called today requesting refills on tramadol spoke with pharmacist who stated pt is still early on refills. Pt last received a refill on medication 05/07/18 and still has a few meds left on hand. Will inform pt that it is still too early refill and that she will be able to pick up medication on 06/07/18. [place]

## 2018-06-07 ENCOUNTER — Other Ambulatory Visit: Payer: Medicaid Other

## 2018-06-07 DIAGNOSIS — B20 Human immunodeficiency virus [HIV] disease: Secondary | ICD-10-CM

## 2018-06-07 LAB — CBC WITH DIFFERENTIAL/PLATELET
Basophils Absolute: 30 cells/uL (ref 0–200)
Basophils Relative: 0.8 %
Eosinophils Absolute: 42 cells/uL (ref 15–500)
Eosinophils Relative: 1.1 %
HCT: 38.1 % (ref 35.0–45.0)
Hemoglobin: 13.2 g/dL (ref 11.7–15.5)
Lymphs Abs: 2101 cells/uL (ref 850–3900)
MCH: 32.4 pg (ref 27.0–33.0)
MCHC: 34.6 g/dL (ref 32.0–36.0)
MCV: 93.6 fL (ref 80.0–100.0)
MPV: 9.7 fL (ref 7.5–12.5)
Monocytes Relative: 7.7 %
Neutro Abs: 1334 cells/uL — ABNORMAL LOW (ref 1500–7800)
Neutrophils Relative %: 35.1 %
Platelets: 359 10*3/uL (ref 140–400)
RBC: 4.07 10*6/uL (ref 3.80–5.10)
RDW: 12.1 % (ref 11.0–15.0)
Total Lymphocyte: 55.3 %
WBC mixed population: 293 cells/uL (ref 200–950)
WBC: 3.8 10*3/uL (ref 3.8–10.8)

## 2018-06-07 LAB — BASIC METABOLIC PANEL
BUN/Creatinine Ratio: 16 (calc) (ref 6–22)
BUN: 18 mg/dL (ref 7–25)
CO2: 27 mmol/L (ref 20–32)
Calcium: 10 mg/dL (ref 8.6–10.4)
Chloride: 102 mmol/L (ref 98–110)
Creat: 1.11 mg/dL — ABNORMAL HIGH (ref 0.50–1.05)
Glucose, Bld: 92 mg/dL (ref 65–99)
Potassium: 4.7 mmol/L (ref 3.5–5.3)
Sodium: 139 mmol/L (ref 135–146)

## 2018-06-07 LAB — T-HELPER CELL (CD4) - (RCID CLINIC ONLY)
CD4 % Helper T Cell: 39 % (ref 33–55)
CD4 T Cell Abs: 810 /uL (ref 400–2700)

## 2018-06-10 ENCOUNTER — Other Ambulatory Visit: Payer: Medicaid Other

## 2018-06-10 LAB — HIV-1 RNA QUANT-NO REFLEX-BLD
HIV 1 RNA Quant: 62 copies/mL — ABNORMAL HIGH
HIV-1 RNA Quant, Log: 1.79 Log copies/mL — ABNORMAL HIGH

## 2018-06-17 DIAGNOSIS — B20 Human immunodeficiency virus [HIV] disease: Secondary | ICD-10-CM | POA: Diagnosis not present

## 2018-06-17 DIAGNOSIS — I10 Essential (primary) hypertension: Secondary | ICD-10-CM | POA: Diagnosis not present

## 2018-06-17 DIAGNOSIS — J449 Chronic obstructive pulmonary disease, unspecified: Secondary | ICD-10-CM | POA: Diagnosis not present

## 2018-06-18 DIAGNOSIS — B20 Human immunodeficiency virus [HIV] disease: Secondary | ICD-10-CM | POA: Diagnosis not present

## 2018-06-18 DIAGNOSIS — J449 Chronic obstructive pulmonary disease, unspecified: Secondary | ICD-10-CM | POA: Diagnosis not present

## 2018-06-18 DIAGNOSIS — I10 Essential (primary) hypertension: Secondary | ICD-10-CM | POA: Diagnosis not present

## 2018-06-18 MED FILL — BIKTARVY 50-200-25 MG TABS: 50-200-25 | 30 days supply | Qty: 30 | Fill #4

## 2018-06-19 DIAGNOSIS — I10 Essential (primary) hypertension: Secondary | ICD-10-CM | POA: Diagnosis not present

## 2018-06-19 DIAGNOSIS — J449 Chronic obstructive pulmonary disease, unspecified: Secondary | ICD-10-CM | POA: Diagnosis not present

## 2018-06-19 DIAGNOSIS — B20 Human immunodeficiency virus [HIV] disease: Secondary | ICD-10-CM | POA: Diagnosis not present

## 2018-06-20 DIAGNOSIS — J449 Chronic obstructive pulmonary disease, unspecified: Secondary | ICD-10-CM | POA: Diagnosis not present

## 2018-06-20 DIAGNOSIS — I10 Essential (primary) hypertension: Secondary | ICD-10-CM | POA: Diagnosis not present

## 2018-06-20 DIAGNOSIS — B20 Human immunodeficiency virus [HIV] disease: Secondary | ICD-10-CM | POA: Diagnosis not present

## 2018-06-21 DIAGNOSIS — I10 Essential (primary) hypertension: Secondary | ICD-10-CM | POA: Diagnosis not present

## 2018-06-21 DIAGNOSIS — B20 Human immunodeficiency virus [HIV] disease: Secondary | ICD-10-CM | POA: Diagnosis not present

## 2018-06-21 DIAGNOSIS — J449 Chronic obstructive pulmonary disease, unspecified: Secondary | ICD-10-CM | POA: Diagnosis not present

## 2018-06-22 DIAGNOSIS — B20 Human immunodeficiency virus [HIV] disease: Secondary | ICD-10-CM | POA: Diagnosis not present

## 2018-06-22 DIAGNOSIS — J449 Chronic obstructive pulmonary disease, unspecified: Secondary | ICD-10-CM | POA: Diagnosis not present

## 2018-06-22 DIAGNOSIS — I10 Essential (primary) hypertension: Secondary | ICD-10-CM | POA: Diagnosis not present

## 2018-06-23 DIAGNOSIS — J449 Chronic obstructive pulmonary disease, unspecified: Secondary | ICD-10-CM | POA: Diagnosis not present

## 2018-06-23 DIAGNOSIS — I10 Essential (primary) hypertension: Secondary | ICD-10-CM | POA: Diagnosis not present

## 2018-06-23 DIAGNOSIS — B20 Human immunodeficiency virus [HIV] disease: Secondary | ICD-10-CM | POA: Diagnosis not present

## 2018-06-24 ENCOUNTER — Encounter: Payer: Medicaid Other | Admitting: Internal Medicine

## 2018-06-24 DIAGNOSIS — B20 Human immunodeficiency virus [HIV] disease: Secondary | ICD-10-CM | POA: Diagnosis not present

## 2018-06-24 DIAGNOSIS — I10 Essential (primary) hypertension: Secondary | ICD-10-CM | POA: Diagnosis not present

## 2018-06-24 DIAGNOSIS — J449 Chronic obstructive pulmonary disease, unspecified: Secondary | ICD-10-CM | POA: Diagnosis not present

## 2018-06-25 DIAGNOSIS — I10 Essential (primary) hypertension: Secondary | ICD-10-CM | POA: Diagnosis not present

## 2018-06-25 DIAGNOSIS — B20 Human immunodeficiency virus [HIV] disease: Secondary | ICD-10-CM | POA: Diagnosis not present

## 2018-06-25 DIAGNOSIS — J449 Chronic obstructive pulmonary disease, unspecified: Secondary | ICD-10-CM | POA: Diagnosis not present

## 2018-06-26 DIAGNOSIS — B20 Human immunodeficiency virus [HIV] disease: Secondary | ICD-10-CM | POA: Diagnosis not present

## 2018-06-26 DIAGNOSIS — J449 Chronic obstructive pulmonary disease, unspecified: Secondary | ICD-10-CM | POA: Diagnosis not present

## 2018-06-26 DIAGNOSIS — I10 Essential (primary) hypertension: Secondary | ICD-10-CM | POA: Diagnosis not present

## 2018-06-27 DIAGNOSIS — I10 Essential (primary) hypertension: Secondary | ICD-10-CM | POA: Diagnosis not present

## 2018-06-27 DIAGNOSIS — J449 Chronic obstructive pulmonary disease, unspecified: Secondary | ICD-10-CM | POA: Diagnosis not present

## 2018-06-27 DIAGNOSIS — B20 Human immunodeficiency virus [HIV] disease: Secondary | ICD-10-CM | POA: Diagnosis not present

## 2018-06-28 ENCOUNTER — Other Ambulatory Visit: Payer: Self-pay | Admitting: Internal Medicine

## 2018-06-28 DIAGNOSIS — J449 Chronic obstructive pulmonary disease, unspecified: Secondary | ICD-10-CM | POA: Diagnosis not present

## 2018-06-28 DIAGNOSIS — I10 Essential (primary) hypertension: Secondary | ICD-10-CM | POA: Diagnosis not present

## 2018-06-28 DIAGNOSIS — B20 Human immunodeficiency virus [HIV] disease: Secondary | ICD-10-CM | POA: Diagnosis not present

## 2018-06-28 DIAGNOSIS — Z1231 Encounter for screening mammogram for malignant neoplasm of breast: Secondary | ICD-10-CM

## 2018-06-29 DIAGNOSIS — I10 Essential (primary) hypertension: Secondary | ICD-10-CM | POA: Diagnosis not present

## 2018-06-29 DIAGNOSIS — B20 Human immunodeficiency virus [HIV] disease: Secondary | ICD-10-CM | POA: Diagnosis not present

## 2018-06-29 DIAGNOSIS — J449 Chronic obstructive pulmonary disease, unspecified: Secondary | ICD-10-CM | POA: Diagnosis not present

## 2018-06-30 DIAGNOSIS — B20 Human immunodeficiency virus [HIV] disease: Secondary | ICD-10-CM | POA: Diagnosis not present

## 2018-06-30 DIAGNOSIS — J449 Chronic obstructive pulmonary disease, unspecified: Secondary | ICD-10-CM | POA: Diagnosis not present

## 2018-06-30 DIAGNOSIS — I10 Essential (primary) hypertension: Secondary | ICD-10-CM | POA: Diagnosis not present

## 2018-07-01 DIAGNOSIS — B20 Human immunodeficiency virus [HIV] disease: Secondary | ICD-10-CM | POA: Diagnosis not present

## 2018-07-01 DIAGNOSIS — J449 Chronic obstructive pulmonary disease, unspecified: Secondary | ICD-10-CM | POA: Diagnosis not present

## 2018-07-01 DIAGNOSIS — I10 Essential (primary) hypertension: Secondary | ICD-10-CM | POA: Diagnosis not present

## 2018-07-02 DIAGNOSIS — B20 Human immunodeficiency virus [HIV] disease: Secondary | ICD-10-CM | POA: Diagnosis not present

## 2018-07-02 DIAGNOSIS — I10 Essential (primary) hypertension: Secondary | ICD-10-CM | POA: Diagnosis not present

## 2018-07-02 DIAGNOSIS — J449 Chronic obstructive pulmonary disease, unspecified: Secondary | ICD-10-CM | POA: Diagnosis not present

## 2018-07-03 DIAGNOSIS — J449 Chronic obstructive pulmonary disease, unspecified: Secondary | ICD-10-CM | POA: Diagnosis not present

## 2018-07-03 DIAGNOSIS — B20 Human immunodeficiency virus [HIV] disease: Secondary | ICD-10-CM | POA: Diagnosis not present

## 2018-07-03 DIAGNOSIS — I10 Essential (primary) hypertension: Secondary | ICD-10-CM | POA: Diagnosis not present

## 2018-07-04 DIAGNOSIS — I10 Essential (primary) hypertension: Secondary | ICD-10-CM | POA: Diagnosis not present

## 2018-07-04 DIAGNOSIS — J449 Chronic obstructive pulmonary disease, unspecified: Secondary | ICD-10-CM | POA: Diagnosis not present

## 2018-07-04 DIAGNOSIS — B20 Human immunodeficiency virus [HIV] disease: Secondary | ICD-10-CM | POA: Diagnosis not present

## 2018-07-05 ENCOUNTER — Telehealth: Payer: Self-pay | Admitting: *Deleted

## 2018-07-05 DIAGNOSIS — B20 Human immunodeficiency virus [HIV] disease: Secondary | ICD-10-CM | POA: Diagnosis not present

## 2018-07-05 DIAGNOSIS — J449 Chronic obstructive pulmonary disease, unspecified: Secondary | ICD-10-CM | POA: Diagnosis not present

## 2018-07-05 DIAGNOSIS — M1612 Unilateral primary osteoarthritis, left hip: Secondary | ICD-10-CM

## 2018-07-05 DIAGNOSIS — I10 Essential (primary) hypertension: Secondary | ICD-10-CM | POA: Diagnosis not present

## 2018-07-05 MED ORDER — TRAMADOL HCL 50 MG PO TABS: 50.0000 mg | ORAL_TABLET | Freq: Two times a day (BID) | ORAL | 0 refills | Status: DC | PRN

## 2018-07-05 NOTE — Telephone Encounter (Signed)
Patient left message requesting refill of tramadol to be called into [facility]. She has a lab appointment 7/31, with follow up 8/5.

## 2018-07-06 ENCOUNTER — Other Ambulatory Visit: Payer: Self-pay | Admitting: Internal Medicine

## 2018-07-06 DIAGNOSIS — B20 Human immunodeficiency virus [HIV] disease: Secondary | ICD-10-CM | POA: Diagnosis not present

## 2018-07-06 DIAGNOSIS — J449 Chronic obstructive pulmonary disease, unspecified: Secondary | ICD-10-CM | POA: Diagnosis not present

## 2018-07-06 DIAGNOSIS — I10 Essential (primary) hypertension: Secondary | ICD-10-CM | POA: Diagnosis not present

## 2018-07-06 DIAGNOSIS — M1612 Unilateral primary osteoarthritis, left hip: Secondary | ICD-10-CM

## 2018-07-07 DIAGNOSIS — J449 Chronic obstructive pulmonary disease, unspecified: Secondary | ICD-10-CM | POA: Diagnosis not present

## 2018-07-07 DIAGNOSIS — I10 Essential (primary) hypertension: Secondary | ICD-10-CM | POA: Diagnosis not present

## 2018-07-07 DIAGNOSIS — B20 Human immunodeficiency virus [HIV] disease: Secondary | ICD-10-CM | POA: Diagnosis not present

## 2018-07-08 DIAGNOSIS — J449 Chronic obstructive pulmonary disease, unspecified: Secondary | ICD-10-CM | POA: Diagnosis not present

## 2018-07-08 DIAGNOSIS — B20 Human immunodeficiency virus [HIV] disease: Secondary | ICD-10-CM | POA: Diagnosis not present

## 2018-07-08 DIAGNOSIS — I10 Essential (primary) hypertension: Secondary | ICD-10-CM | POA: Diagnosis not present

## 2018-07-09 DIAGNOSIS — I10 Essential (primary) hypertension: Secondary | ICD-10-CM | POA: Diagnosis not present

## 2018-07-09 DIAGNOSIS — J449 Chronic obstructive pulmonary disease, unspecified: Secondary | ICD-10-CM | POA: Diagnosis not present

## 2018-07-09 DIAGNOSIS — B20 Human immunodeficiency virus [HIV] disease: Secondary | ICD-10-CM | POA: Diagnosis not present

## 2018-07-10 ENCOUNTER — Ambulatory Visit (INDEPENDENT_AMBULATORY_CARE_PROVIDER_SITE_OTHER): Payer: Medicaid Other | Admitting: Internal Medicine

## 2018-07-10 ENCOUNTER — Other Ambulatory Visit: Payer: Self-pay

## 2018-07-10 ENCOUNTER — Encounter: Payer: Self-pay | Admitting: Internal Medicine

## 2018-07-10 ENCOUNTER — Telehealth: Payer: Self-pay

## 2018-07-10 VITALS — BP 121/79 | HR 83 | Wt 93.0 lb

## 2018-07-10 DIAGNOSIS — R634 Abnormal weight loss: Secondary | ICD-10-CM | POA: Diagnosis not present

## 2018-07-10 DIAGNOSIS — Z23 Encounter for immunization: Secondary | ICD-10-CM | POA: Diagnosis not present

## 2018-07-10 DIAGNOSIS — B20 Human immunodeficiency virus [HIV] disease: Secondary | ICD-10-CM

## 2018-07-10 DIAGNOSIS — I10 Essential (primary) hypertension: Secondary | ICD-10-CM | POA: Diagnosis not present

## 2018-07-10 DIAGNOSIS — Z79899 Other long term (current) drug therapy: Secondary | ICD-10-CM

## 2018-07-10 DIAGNOSIS — J449 Chronic obstructive pulmonary disease, unspecified: Secondary | ICD-10-CM | POA: Diagnosis not present

## 2018-07-10 DIAGNOSIS — E43 Unspecified severe protein-calorie malnutrition: Secondary | ICD-10-CM | POA: Diagnosis not present

## 2018-07-10 MED ORDER — DRONABINOL 5 MG PO CAPS: 5.0000 mg | ORAL_CAPSULE | Freq: Two times a day (BID) | ORAL | 3 refills | Status: DC

## 2018-07-10 MED ORDER — ENSURE PO LIQD: 237.0000 mL | Freq: Three times a day (TID) | ORAL | 5 refills | Status: DC

## 2018-07-10 NOTE — Telephone Encounter (Signed)
Called pt to confirm which pharmacy she gets her medication from to call in prescription for Dronabinol 5 mg caps. 60 caps dispensed with 3 refills. Called medication into [facility] per pt's request.Pharmacist was able to take a verbal order through the phone. PT has no questions regarding medication and will call pharmacy later this evening to pickup.  [place]

## 2018-07-10 NOTE — Progress Notes (Signed)
RF: hiv disease follow up  Patient ID: Janice Williams, female   DOB: [birth year], 57 y.o.   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 Doing well. But still difficult to gain weight.she believes that she may have lost weight since she was here last. She denies having any recent illnesses. Continues to have good adherence  Outpatient Encounter Medications as of 07/10/2018  Medication Sig  . albuterol (PROVENTIL HFA;VENTOLIN HFA) 108 (90 Base) MCG/ACT inhaler Inhale 2 puffs into the lungs every 6 (six) hours as needed for wheezing or shortness of breath.  [name] alendronate (FOSAMAX) 70 MG tablet TAKE 1 TABLET BY MOUTH ONCE A WEEK. TAKE WITH A FULL GLASS OF WATER ON EMPTY STOMACH  . amLODipine (NORVASC) 10 MG tablet TAKE ONE TABLET BY MOUTH DAILY  . BIKTARVY 50-200-25 MG TABS tablet TAKE 1 TABLET BY MOUTH DAILY.  . calcium-vitamin D (OSCAL WITH D) 500-200 MG-UNIT tablet Take 1 tablet by mouth 2 (two) times daily.  [name] dronabinol (MARINOL) 5 MG capsule Take 1 capsule (5 mg total) by mouth 2 (two) times daily before a meal.  . ENSURE (ENSURE) Take 237 mLs by mouth 3 (three) times daily between meals.  . fluticasone (FLONASE) 50 MCG/ACT nasal spray INSTILL 1 SPRAY INTO BOTH NOSTRILS DAILY  . loratadine (CLARITIN) 10 MG tablet Take 1 tablet (10 mg total) by mouth daily.  [name] omeprazole (PRILOSEC) 20 MG capsule TAKE 1 CAPSULE BY MOUTH EVERY DAY  . ondansetron (ZOFRAN) 4 MG tablet Take 1 tablet (4 mg total) by mouth every 8 (eight) hours as needed for nausea or vomiting.  . polyethylene glycol (MIRALAX / GLYCOLAX) packet Take one capful in 8 oz of fluid of your choice twice a day until having soft stools, then back down to once a day.  If not having soft stools with twice a day, increased to three times a day (Patient taking differently: Take 17 g by mouth 2 (two) times daily as needed for mild constipation. )  . pramoxine (PROCTOFOAM) 1 % foam Place 1  application rectally 3 (three) times daily as needed for itching.  . pravastatin (PRAVACHOL) 20 MG tablet TAKE 1 TABLET BY MOUTH EVERY DAY  . sodium chloride (OCEAN) 0.65 % SOLN nasal spray Place 2 sprays into both nostrils 3 (three) times daily.  . traMADol (ULTRAM) 50 MG tablet Take 1 tablet (50 mg total) by mouth every 12 (twelve) hours as needed.  . Vitamin D, Ergocalciferol, (DRISDOL) 50000 units CAPS capsule TAKE 1 CAPSULE BY MOUTH EVERY 7 DAYS  . Witch Hazel (TUCKS) 50 % PADS Use as needed for rectal itching, after having BM   No facility-administered encounter medications on file as of 07/10/2018.      Patient Active Problem List   Diagnosis Date Noted  . Moderate protein-calorie malnutrition ([place]) 05/31/2018  . COPD with acute exacerbation ([place]) 06/09/2017  . Sinusitis 06/08/2017  . Hypertension 06/08/2017  . Dental abscess 03/15/2015  . Knee pain, bilateral 03/15/2015  . Acne cystica 12/22/2011  . Dyslipidemia 07/06/2011  . Routine health maintenance 07/06/2011  . EPIDERMOID CYST 02/10/2011  . ACUTE SINUSITIS, UNSPECIFIED 01/20/2010  . EXTERNAL OTITIS 01/06/2010  . Hyperlipidemia 06/03/2009  . WEIGHT LOSS 06/03/2009  . CARBUNCLE AND FURUNCLE OF FACE 02/04/2009  . FACIAL RASH 02/04/2009  . Human immunodeficiency virus (HIV) disease ([place]) 10/29/2008  . ANEMIA-NOS 10/29/2008  . PERIPHERAL NEUROPATHY 10/29/2008  . GERD 10/29/2008  .  RENAL INSUFFICIENCY 10/29/2008  . CEREBROVASCULAR ACCIDENT, HX OF 10/29/2008  . TOBACCO DEPENDENCE 02/14/2007  . CVA 02/14/2007  . RHINITIS, ALLERGIC 02/14/2007  . DYSPEPSIA 02/14/2007  . ACNE 02/14/2007  . WEIGHT LOSS, ABNORMAL 02/14/2007     Health Maintenance Due  Topic Date Due  . COLONOSCOPY  08/28/2010    Social History   Tobacco Use  . Smoking status: Former Smoker    Packs/day: 0.40    Types: Cigarettes    Start date: 12/18/2012  . Smokeless tobacco: Never Used  Substance Use Topics  . Alcohol use: Yes    Alcohol/week:  0.0 oz    Comment: "occasional"   . Drug use: No   Review of Systems Constitutional: +unintentional weight loss.  Negative for fever, chills, diaphoresis, activity change, appetite change, fatigue  HENT: Negative for congestion, sore throat, rhinorrhea, sneezing, trouble swallowing and sinus pressure.  Eyes: Negative for photophobia and visual disturbance.  Respiratory: Negative for cough, chest tightness, shortness of breath, wheezing and stridor.  Cardiovascular: Negative for chest pain, palpitations and leg swelling.  Gastrointestinal: Negative for nausea, vomiting, abdominal pain, diarrhea, constipation, blood in stool, abdominal distention and anal bleeding.  Genitourinary: Negative for dysuria, hematuria, flank pain and difficulty urinating.  Musculoskeletal: Negative for myalgias, back pain, joint swelling, arthralgias and gait problem.  Skin: Negative for color change, pallor, rash and wound.  Neurological: Negative for dizziness, tremors, weakness and light-headedness.  Hematological: Negative for adenopathy. Does not bruise/bleed easily.  Psychiatric/Behavioral: Negative for behavioral problems, confusion, sleep disturbance, dysphoric mood, decreased concentration and agitation.    Physical Exam   BP 121/79   Pulse 83   Wt 93 lb (42.2 kg)   BMI 16.47 kg/m   Physical Exam  Constitutional:  oriented to person, place, and time. appears well-developed and well-nourished. No distress. Thin/frail HENT: [place]/AT, PERRLA, no scleral icterus Mouth/Throat: Oropharynx is clear and moist. No oropharyngeal exudate.  Cardiovascular: Normal rate, regular rhythm and normal heart sounds. Exam reveals no gallop and no friction rub.  No murmur heard.  Pulmonary/Chest: Effort normal and breath sounds normal. No respiratory distress.  has no wheezes.  Neck = supple, no nuchal rigidity Abdominal: Soft. Bowel sounds are normal.  exhibits no distension. There is no tenderness.  Lymphadenopathy: no  cervical adenopathy. No axillary adenopathy Neurological: alert and oriented to person, place, and time.  Skin: Skin is warm and dry. No rash noted. No erythema.  Psychiatric: a normal mood and affect.  behavior is normal.   Lab Results  Component Value Date   CD4TCELL 39 06/07/2018   Lab Results  Component Value Date   CD4TABS 810 06/07/2018   CD4TABS 830 02/01/2018   CD4TABS 650 11/20/2017   Lab Results  Component Value Date   HIV1RNAQUANT 62 (H) 06/07/2018   Lab Results  Component Value Date   HEPBSAB NEG 12/28/2014   Lab Results  Component Value Date   LABRPR NON REAC 04/09/2017    CBC Lab Results  Component Value Date   WBC 3.8 06/07/2018   RBC 4.07 06/07/2018   HGB 13.2 06/07/2018   HCT 38.1 06/07/2018   PLT 359 06/07/2018   MCV 93.6 06/07/2018   MCH 32.4 06/07/2018   MCHC 34.6 06/07/2018   RDW 12.1 06/07/2018   LYMPHSABS 2,101 06/07/2018   MONOABS 258 06/27/2017   EOSABS 42 06/07/2018    BMET Lab Results  Component Value Date   NA 139 06/07/2018   K 4.7 06/07/2018   CL 102 06/07/2018  CO2 27 06/07/2018   GLUCOSE 92 06/07/2018   BUN 18 06/07/2018   CREATININE 1.11 (H) 06/07/2018   CALCIUM 10.0 06/07/2018   GFRNONAA 54 (L) 02/01/2018   GFRAA 62 02/01/2018      Assessment and Plan  Weight loss, unintentional = has severe protein caloric malnutrition with bmi of 16; Megace has not worked in the past, will try marinol plus rx for ensure  hiv disease= well controlled hiv disease, continue biktarvy  Long term medication = cr stable no need to change regimen  htn = continue on current regimen

## 2018-07-11 DIAGNOSIS — B20 Human immunodeficiency virus [HIV] disease: Secondary | ICD-10-CM | POA: Diagnosis not present

## 2018-07-11 DIAGNOSIS — J449 Chronic obstructive pulmonary disease, unspecified: Secondary | ICD-10-CM | POA: Diagnosis not present

## 2018-07-11 DIAGNOSIS — I10 Essential (primary) hypertension: Secondary | ICD-10-CM | POA: Diagnosis not present

## 2018-07-12 DIAGNOSIS — J449 Chronic obstructive pulmonary disease, unspecified: Secondary | ICD-10-CM | POA: Diagnosis not present

## 2018-07-12 DIAGNOSIS — I10 Essential (primary) hypertension: Secondary | ICD-10-CM | POA: Diagnosis not present

## 2018-07-12 DIAGNOSIS — B20 Human immunodeficiency virus [HIV] disease: Secondary | ICD-10-CM | POA: Diagnosis not present

## 2018-07-13 DIAGNOSIS — B20 Human immunodeficiency virus [HIV] disease: Secondary | ICD-10-CM | POA: Diagnosis not present

## 2018-07-13 DIAGNOSIS — I10 Essential (primary) hypertension: Secondary | ICD-10-CM | POA: Diagnosis not present

## 2018-07-13 DIAGNOSIS — J449 Chronic obstructive pulmonary disease, unspecified: Secondary | ICD-10-CM | POA: Diagnosis not present

## 2018-07-14 DIAGNOSIS — B20 Human immunodeficiency virus [HIV] disease: Secondary | ICD-10-CM | POA: Diagnosis not present

## 2018-07-14 DIAGNOSIS — I10 Essential (primary) hypertension: Secondary | ICD-10-CM | POA: Diagnosis not present

## 2018-07-14 DIAGNOSIS — J449 Chronic obstructive pulmonary disease, unspecified: Secondary | ICD-10-CM | POA: Diagnosis not present

## 2018-07-15 DIAGNOSIS — J449 Chronic obstructive pulmonary disease, unspecified: Secondary | ICD-10-CM | POA: Diagnosis not present

## 2018-07-15 DIAGNOSIS — I10 Essential (primary) hypertension: Secondary | ICD-10-CM | POA: Diagnosis not present

## 2018-07-15 DIAGNOSIS — B20 Human immunodeficiency virus [HIV] disease: Secondary | ICD-10-CM | POA: Diagnosis not present

## 2018-07-16 DIAGNOSIS — J449 Chronic obstructive pulmonary disease, unspecified: Secondary | ICD-10-CM | POA: Diagnosis not present

## 2018-07-16 DIAGNOSIS — I10 Essential (primary) hypertension: Secondary | ICD-10-CM | POA: Diagnosis not present

## 2018-07-16 DIAGNOSIS — B20 Human immunodeficiency virus [HIV] disease: Secondary | ICD-10-CM | POA: Diagnosis not present

## 2018-07-17 ENCOUNTER — Telehealth: Payer: Self-pay

## 2018-07-17 ENCOUNTER — Ambulatory Visit: Payer: Medicaid Other | Admitting: Internal Medicine

## 2018-07-17 DIAGNOSIS — I10 Essential (primary) hypertension: Secondary | ICD-10-CM | POA: Diagnosis not present

## 2018-07-17 DIAGNOSIS — B20 Human immunodeficiency virus [HIV] disease: Secondary | ICD-10-CM | POA: Diagnosis not present

## 2018-07-17 DIAGNOSIS — J449 Chronic obstructive pulmonary disease, unspecified: Secondary | ICD-10-CM | POA: Diagnosis not present

## 2018-07-17 MED FILL — BIKTARVY 50-200-25 MG TABS: 50-200-25 | 30 days supply | Qty: 30 | Fill #5

## 2018-07-17 MED FILL — FLUTICASONE PROP 50 MCG SPR: 50 | 60 days supply | Qty: 16 | Fill #2

## 2018-07-17 NOTE — Telephone Encounter (Signed)
PT called today to check on status on her PA for Dronabinol. Left a vm for pt to inform her we are still waiting on PA response.  [place]

## 2018-07-18 DIAGNOSIS — J449 Chronic obstructive pulmonary disease, unspecified: Secondary | ICD-10-CM | POA: Diagnosis not present

## 2018-07-18 DIAGNOSIS — B20 Human immunodeficiency virus [HIV] disease: Secondary | ICD-10-CM | POA: Diagnosis not present

## 2018-07-18 DIAGNOSIS — I10 Essential (primary) hypertension: Secondary | ICD-10-CM | POA: Diagnosis not present

## 2018-07-19 DIAGNOSIS — J449 Chronic obstructive pulmonary disease, unspecified: Secondary | ICD-10-CM | POA: Diagnosis not present

## 2018-07-19 DIAGNOSIS — I10 Essential (primary) hypertension: Secondary | ICD-10-CM | POA: Diagnosis not present

## 2018-07-19 DIAGNOSIS — B20 Human immunodeficiency virus [HIV] disease: Secondary | ICD-10-CM | POA: Diagnosis not present

## 2018-07-20 DIAGNOSIS — J449 Chronic obstructive pulmonary disease, unspecified: Secondary | ICD-10-CM | POA: Diagnosis not present

## 2018-07-20 DIAGNOSIS — B20 Human immunodeficiency virus [HIV] disease: Secondary | ICD-10-CM | POA: Diagnosis not present

## 2018-07-20 DIAGNOSIS — I10 Essential (primary) hypertension: Secondary | ICD-10-CM | POA: Diagnosis not present

## 2018-07-21 DIAGNOSIS — B20 Human immunodeficiency virus [HIV] disease: Secondary | ICD-10-CM | POA: Diagnosis not present

## 2018-07-21 DIAGNOSIS — I10 Essential (primary) hypertension: Secondary | ICD-10-CM | POA: Diagnosis not present

## 2018-07-21 DIAGNOSIS — J449 Chronic obstructive pulmonary disease, unspecified: Secondary | ICD-10-CM | POA: Diagnosis not present

## 2018-07-22 ENCOUNTER — Ambulatory Visit: Payer: Medicaid Other

## 2018-07-22 DIAGNOSIS — I10 Essential (primary) hypertension: Secondary | ICD-10-CM | POA: Diagnosis not present

## 2018-07-22 DIAGNOSIS — J449 Chronic obstructive pulmonary disease, unspecified: Secondary | ICD-10-CM | POA: Diagnosis not present

## 2018-07-22 DIAGNOSIS — B20 Human immunodeficiency virus [HIV] disease: Secondary | ICD-10-CM | POA: Diagnosis not present

## 2018-07-22 NOTE — Telephone Encounter (Signed)
PA denied 07/22/18 insurance states pt must have failed two preferred medications. Will inform MD that PA was denied and that a new prescription must be sent in.  [place]

## 2018-07-23 DIAGNOSIS — B20 Human immunodeficiency virus [HIV] disease: Secondary | ICD-10-CM | POA: Diagnosis not present

## 2018-07-23 DIAGNOSIS — I10 Essential (primary) hypertension: Secondary | ICD-10-CM | POA: Diagnosis not present

## 2018-07-23 DIAGNOSIS — J449 Chronic obstructive pulmonary disease, unspecified: Secondary | ICD-10-CM | POA: Diagnosis not present

## 2018-07-24 DIAGNOSIS — I10 Essential (primary) hypertension: Secondary | ICD-10-CM | POA: Diagnosis not present

## 2018-07-24 DIAGNOSIS — B20 Human immunodeficiency virus [HIV] disease: Secondary | ICD-10-CM | POA: Diagnosis not present

## 2018-07-24 DIAGNOSIS — J449 Chronic obstructive pulmonary disease, unspecified: Secondary | ICD-10-CM | POA: Diagnosis not present

## 2018-07-25 DIAGNOSIS — I10 Essential (primary) hypertension: Secondary | ICD-10-CM | POA: Diagnosis not present

## 2018-07-25 DIAGNOSIS — Z87891 Personal history of nicotine dependence: Secondary | ICD-10-CM | POA: Diagnosis not present

## 2018-07-25 DIAGNOSIS — R630 Anorexia: Secondary | ICD-10-CM | POA: Diagnosis not present

## 2018-07-25 DIAGNOSIS — J449 Chronic obstructive pulmonary disease, unspecified: Secondary | ICD-10-CM | POA: Diagnosis not present

## 2018-07-25 DIAGNOSIS — B20 Human immunodeficiency virus [HIV] disease: Secondary | ICD-10-CM | POA: Diagnosis not present

## 2018-07-25 DIAGNOSIS — R636 Underweight: Secondary | ICD-10-CM | POA: Diagnosis not present

## 2018-07-26 DIAGNOSIS — B20 Human immunodeficiency virus [HIV] disease: Secondary | ICD-10-CM | POA: Diagnosis not present

## 2018-07-26 DIAGNOSIS — I10 Essential (primary) hypertension: Secondary | ICD-10-CM | POA: Diagnosis not present

## 2018-07-26 DIAGNOSIS — J449 Chronic obstructive pulmonary disease, unspecified: Secondary | ICD-10-CM | POA: Diagnosis not present

## 2018-07-27 DIAGNOSIS — J449 Chronic obstructive pulmonary disease, unspecified: Secondary | ICD-10-CM | POA: Diagnosis not present

## 2018-07-27 DIAGNOSIS — B20 Human immunodeficiency virus [HIV] disease: Secondary | ICD-10-CM | POA: Diagnosis not present

## 2018-07-27 DIAGNOSIS — I10 Essential (primary) hypertension: Secondary | ICD-10-CM | POA: Diagnosis not present

## 2018-07-28 DIAGNOSIS — B20 Human immunodeficiency virus [HIV] disease: Secondary | ICD-10-CM | POA: Diagnosis not present

## 2018-07-28 DIAGNOSIS — I10 Essential (primary) hypertension: Secondary | ICD-10-CM | POA: Diagnosis not present

## 2018-07-28 DIAGNOSIS — J449 Chronic obstructive pulmonary disease, unspecified: Secondary | ICD-10-CM | POA: Diagnosis not present

## 2018-07-29 DIAGNOSIS — J449 Chronic obstructive pulmonary disease, unspecified: Secondary | ICD-10-CM | POA: Diagnosis not present

## 2018-07-29 DIAGNOSIS — B20 Human immunodeficiency virus [HIV] disease: Secondary | ICD-10-CM | POA: Diagnosis not present

## 2018-07-29 DIAGNOSIS — I10 Essential (primary) hypertension: Secondary | ICD-10-CM | POA: Diagnosis not present

## 2018-07-30 DIAGNOSIS — J449 Chronic obstructive pulmonary disease, unspecified: Secondary | ICD-10-CM | POA: Diagnosis not present

## 2018-07-30 DIAGNOSIS — B20 Human immunodeficiency virus [HIV] disease: Secondary | ICD-10-CM | POA: Diagnosis not present

## 2018-07-30 DIAGNOSIS — I10 Essential (primary) hypertension: Secondary | ICD-10-CM | POA: Diagnosis not present

## 2018-07-31 DIAGNOSIS — B20 Human immunodeficiency virus [HIV] disease: Secondary | ICD-10-CM | POA: Diagnosis not present

## 2018-07-31 DIAGNOSIS — I10 Essential (primary) hypertension: Secondary | ICD-10-CM | POA: Diagnosis not present

## 2018-07-31 DIAGNOSIS — J449 Chronic obstructive pulmonary disease, unspecified: Secondary | ICD-10-CM | POA: Diagnosis not present

## 2018-08-01 DIAGNOSIS — B20 Human immunodeficiency virus [HIV] disease: Secondary | ICD-10-CM | POA: Diagnosis not present

## 2018-08-01 DIAGNOSIS — J449 Chronic obstructive pulmonary disease, unspecified: Secondary | ICD-10-CM | POA: Diagnosis not present

## 2018-08-01 DIAGNOSIS — I10 Essential (primary) hypertension: Secondary | ICD-10-CM | POA: Diagnosis not present

## 2018-08-02 DIAGNOSIS — B20 Human immunodeficiency virus [HIV] disease: Secondary | ICD-10-CM | POA: Diagnosis not present

## 2018-08-02 DIAGNOSIS — I10 Essential (primary) hypertension: Secondary | ICD-10-CM | POA: Diagnosis not present

## 2018-08-02 DIAGNOSIS — J449 Chronic obstructive pulmonary disease, unspecified: Secondary | ICD-10-CM | POA: Diagnosis not present

## 2018-08-03 DIAGNOSIS — B20 Human immunodeficiency virus [HIV] disease: Secondary | ICD-10-CM | POA: Diagnosis not present

## 2018-08-03 DIAGNOSIS — J449 Chronic obstructive pulmonary disease, unspecified: Secondary | ICD-10-CM | POA: Diagnosis not present

## 2018-08-03 DIAGNOSIS — I10 Essential (primary) hypertension: Secondary | ICD-10-CM | POA: Diagnosis not present

## 2018-08-04 DIAGNOSIS — I10 Essential (primary) hypertension: Secondary | ICD-10-CM | POA: Diagnosis not present

## 2018-08-04 DIAGNOSIS — J449 Chronic obstructive pulmonary disease, unspecified: Secondary | ICD-10-CM | POA: Diagnosis not present

## 2018-08-04 DIAGNOSIS — B20 Human immunodeficiency virus [HIV] disease: Secondary | ICD-10-CM | POA: Diagnosis not present

## 2018-08-05 DIAGNOSIS — B20 Human immunodeficiency virus [HIV] disease: Secondary | ICD-10-CM | POA: Diagnosis not present

## 2018-08-05 DIAGNOSIS — J449 Chronic obstructive pulmonary disease, unspecified: Secondary | ICD-10-CM | POA: Diagnosis not present

## 2018-08-05 DIAGNOSIS — I10 Essential (primary) hypertension: Secondary | ICD-10-CM | POA: Diagnosis not present

## 2018-08-06 ENCOUNTER — Telehealth: Payer: Self-pay | Admitting: *Deleted

## 2018-08-06 DIAGNOSIS — I10 Essential (primary) hypertension: Secondary | ICD-10-CM | POA: Diagnosis not present

## 2018-08-06 DIAGNOSIS — J449 Chronic obstructive pulmonary disease, unspecified: Secondary | ICD-10-CM | POA: Diagnosis not present

## 2018-08-06 DIAGNOSIS — B20 Human immunodeficiency virus [HIV] disease: Secondary | ICD-10-CM | POA: Diagnosis not present

## 2018-08-06 DIAGNOSIS — M1612 Unilateral primary osteoarthritis, left hip: Secondary | ICD-10-CM

## 2018-08-06 MED ORDER — TRAMADOL HCL 50 MG PO TABS: 50.0000 mg | ORAL_TABLET | Freq: Two times a day (BID) | ORAL | 0 refills | Status: DC | PRN

## 2018-08-06 NOTE — Telephone Encounter (Signed)
Patient called triage asking for tramadol refill.  RN called it in to patient's pharmacy of choice. Landis Gandy, RN

## 2018-08-07 DIAGNOSIS — I10 Essential (primary) hypertension: Secondary | ICD-10-CM | POA: Diagnosis not present

## 2018-08-07 DIAGNOSIS — B20 Human immunodeficiency virus [HIV] disease: Secondary | ICD-10-CM | POA: Diagnosis not present

## 2018-08-07 DIAGNOSIS — J449 Chronic obstructive pulmonary disease, unspecified: Secondary | ICD-10-CM | POA: Diagnosis not present

## 2018-08-08 DIAGNOSIS — I10 Essential (primary) hypertension: Secondary | ICD-10-CM | POA: Diagnosis not present

## 2018-08-08 DIAGNOSIS — B20 Human immunodeficiency virus [HIV] disease: Secondary | ICD-10-CM | POA: Diagnosis not present

## 2018-08-08 DIAGNOSIS — J449 Chronic obstructive pulmonary disease, unspecified: Secondary | ICD-10-CM | POA: Diagnosis not present

## 2018-08-09 ENCOUNTER — Other Ambulatory Visit: Payer: Self-pay | Admitting: Internal Medicine

## 2018-08-09 DIAGNOSIS — J449 Chronic obstructive pulmonary disease, unspecified: Secondary | ICD-10-CM | POA: Diagnosis not present

## 2018-08-09 DIAGNOSIS — B20 Human immunodeficiency virus [HIV] disease: Secondary | ICD-10-CM | POA: Diagnosis not present

## 2018-08-09 DIAGNOSIS — I10 Essential (primary) hypertension: Secondary | ICD-10-CM | POA: Diagnosis not present

## 2018-08-10 DIAGNOSIS — I10 Essential (primary) hypertension: Secondary | ICD-10-CM | POA: Diagnosis not present

## 2018-08-10 DIAGNOSIS — J449 Chronic obstructive pulmonary disease, unspecified: Secondary | ICD-10-CM | POA: Diagnosis not present

## 2018-08-10 DIAGNOSIS — B20 Human immunodeficiency virus [HIV] disease: Secondary | ICD-10-CM | POA: Diagnosis not present

## 2018-08-11 DIAGNOSIS — I10 Essential (primary) hypertension: Secondary | ICD-10-CM | POA: Diagnosis not present

## 2018-08-11 DIAGNOSIS — J449 Chronic obstructive pulmonary disease, unspecified: Secondary | ICD-10-CM | POA: Diagnosis not present

## 2018-08-11 DIAGNOSIS — B20 Human immunodeficiency virus [HIV] disease: Secondary | ICD-10-CM | POA: Diagnosis not present

## 2018-08-12 DIAGNOSIS — J449 Chronic obstructive pulmonary disease, unspecified: Secondary | ICD-10-CM | POA: Diagnosis not present

## 2018-08-12 DIAGNOSIS — B20 Human immunodeficiency virus [HIV] disease: Secondary | ICD-10-CM | POA: Diagnosis not present

## 2018-08-12 DIAGNOSIS — I10 Essential (primary) hypertension: Secondary | ICD-10-CM | POA: Diagnosis not present

## 2018-08-13 DIAGNOSIS — B20 Human immunodeficiency virus [HIV] disease: Secondary | ICD-10-CM | POA: Diagnosis not present

## 2018-08-13 DIAGNOSIS — J449 Chronic obstructive pulmonary disease, unspecified: Secondary | ICD-10-CM | POA: Diagnosis not present

## 2018-08-13 DIAGNOSIS — I10 Essential (primary) hypertension: Secondary | ICD-10-CM | POA: Diagnosis not present

## 2018-08-14 ENCOUNTER — Ambulatory Visit: Payer: Medicaid Other

## 2018-08-14 DIAGNOSIS — I10 Essential (primary) hypertension: Secondary | ICD-10-CM | POA: Diagnosis not present

## 2018-08-14 DIAGNOSIS — B20 Human immunodeficiency virus [HIV] disease: Secondary | ICD-10-CM | POA: Diagnosis not present

## 2018-08-14 DIAGNOSIS — J449 Chronic obstructive pulmonary disease, unspecified: Secondary | ICD-10-CM | POA: Diagnosis not present

## 2018-08-15 DIAGNOSIS — I10 Essential (primary) hypertension: Secondary | ICD-10-CM | POA: Diagnosis not present

## 2018-08-15 DIAGNOSIS — B20 Human immunodeficiency virus [HIV] disease: Secondary | ICD-10-CM | POA: Diagnosis not present

## 2018-08-15 DIAGNOSIS — J449 Chronic obstructive pulmonary disease, unspecified: Secondary | ICD-10-CM | POA: Diagnosis not present

## 2018-08-16 DIAGNOSIS — B20 Human immunodeficiency virus [HIV] disease: Secondary | ICD-10-CM | POA: Diagnosis not present

## 2018-08-16 DIAGNOSIS — I10 Essential (primary) hypertension: Secondary | ICD-10-CM | POA: Diagnosis not present

## 2018-08-16 DIAGNOSIS — J449 Chronic obstructive pulmonary disease, unspecified: Secondary | ICD-10-CM | POA: Diagnosis not present

## 2018-08-17 DIAGNOSIS — J449 Chronic obstructive pulmonary disease, unspecified: Secondary | ICD-10-CM | POA: Diagnosis not present

## 2018-08-17 DIAGNOSIS — B20 Human immunodeficiency virus [HIV] disease: Secondary | ICD-10-CM | POA: Diagnosis not present

## 2018-08-17 DIAGNOSIS — I10 Essential (primary) hypertension: Secondary | ICD-10-CM | POA: Diagnosis not present

## 2018-08-18 DIAGNOSIS — B20 Human immunodeficiency virus [HIV] disease: Secondary | ICD-10-CM | POA: Diagnosis not present

## 2018-08-18 DIAGNOSIS — I10 Essential (primary) hypertension: Secondary | ICD-10-CM | POA: Diagnosis not present

## 2018-08-18 DIAGNOSIS — J449 Chronic obstructive pulmonary disease, unspecified: Secondary | ICD-10-CM | POA: Diagnosis not present

## 2018-08-19 DIAGNOSIS — J449 Chronic obstructive pulmonary disease, unspecified: Secondary | ICD-10-CM | POA: Diagnosis not present

## 2018-08-19 DIAGNOSIS — B20 Human immunodeficiency virus [HIV] disease: Secondary | ICD-10-CM | POA: Diagnosis not present

## 2018-08-19 DIAGNOSIS — I10 Essential (primary) hypertension: Secondary | ICD-10-CM | POA: Diagnosis not present

## 2018-08-20 ENCOUNTER — Ambulatory Visit
Admission: RE | Admit: 2018-08-20 | Discharge: 2018-08-20 | Disposition: A | Discharge status: Home / Self Care | Payer: Medicaid Other | Source: Ambulatory Visit | Attending: Internal Medicine | Admitting: Internal Medicine

## 2018-08-20 DIAGNOSIS — I10 Essential (primary) hypertension: Secondary | ICD-10-CM | POA: Diagnosis not present

## 2018-08-20 DIAGNOSIS — B20 Human immunodeficiency virus [HIV] disease: Secondary | ICD-10-CM | POA: Diagnosis not present

## 2018-08-20 DIAGNOSIS — J449 Chronic obstructive pulmonary disease, unspecified: Secondary | ICD-10-CM | POA: Diagnosis not present

## 2018-08-20 DIAGNOSIS — Z1231 Encounter for screening mammogram for malignant neoplasm of breast: Secondary | ICD-10-CM | POA: Diagnosis not present

## 2018-08-20 IMAGING — MG DIGITAL SCREENING BILATERAL MAMMOGRAM WITH TOMO AND CAD
8 series · 9 of 24 positions shown · non-contrast
Comparison: Previous exam(s).

CLINICAL DATA: Screening.

EXAM:
DIGITAL SCREENING BILATERAL MAMMOGRAM WITH TOMO AND CAD

[R CC synth-2D]
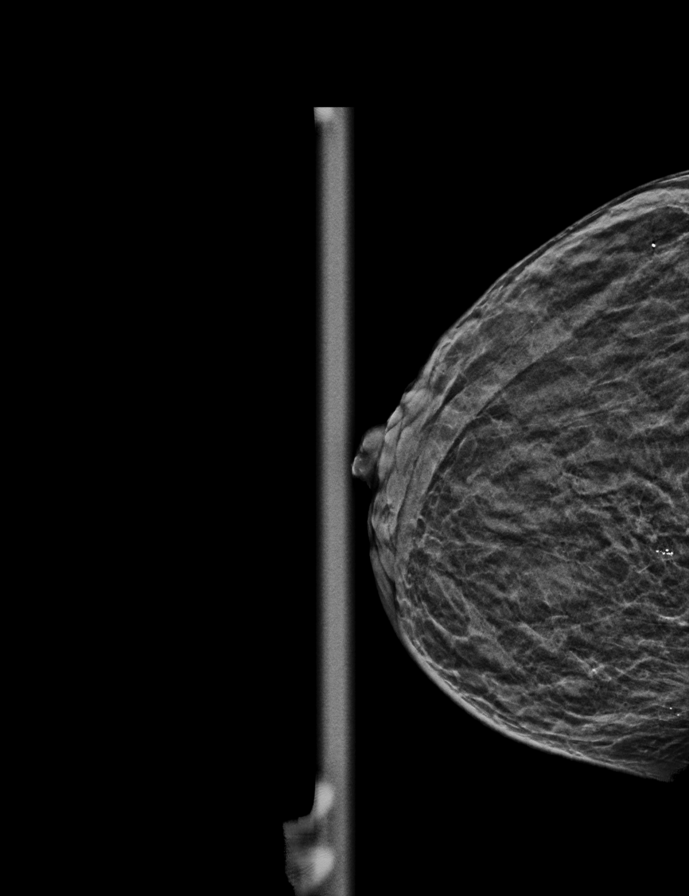

[L CC synth-2D]
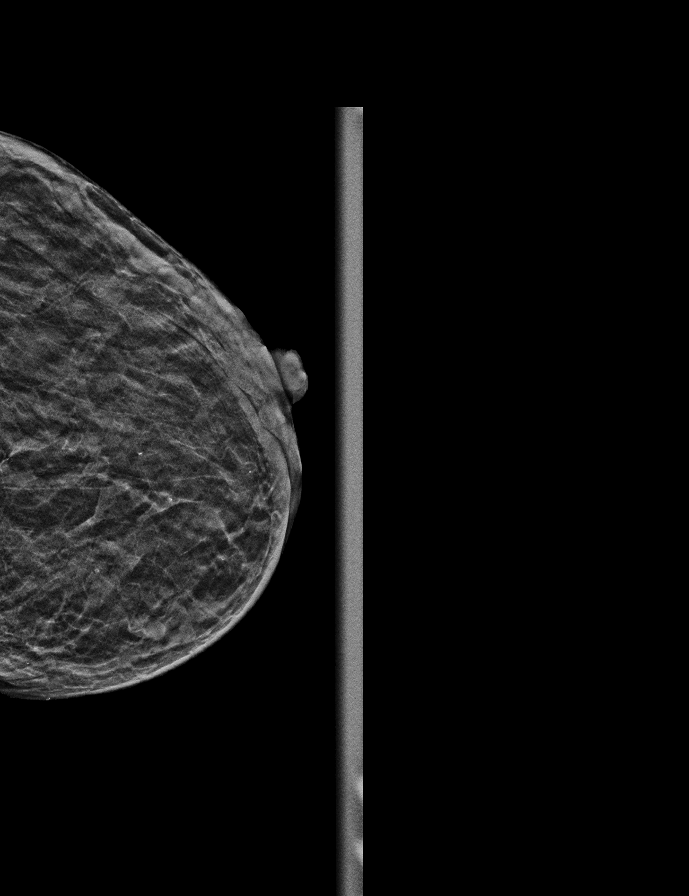

[R MLO synth-2D]
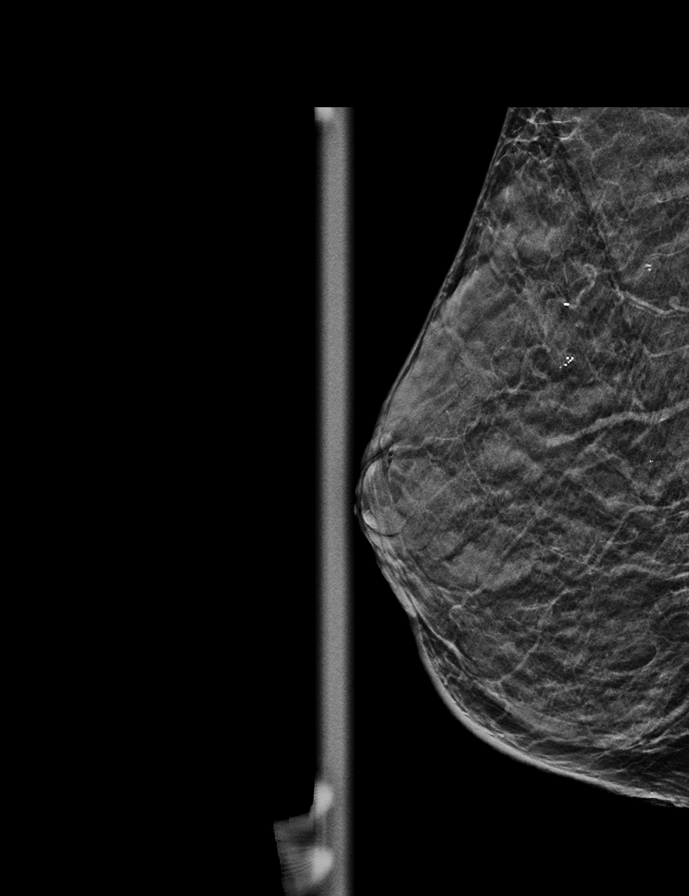

[L MLO synth-2D]
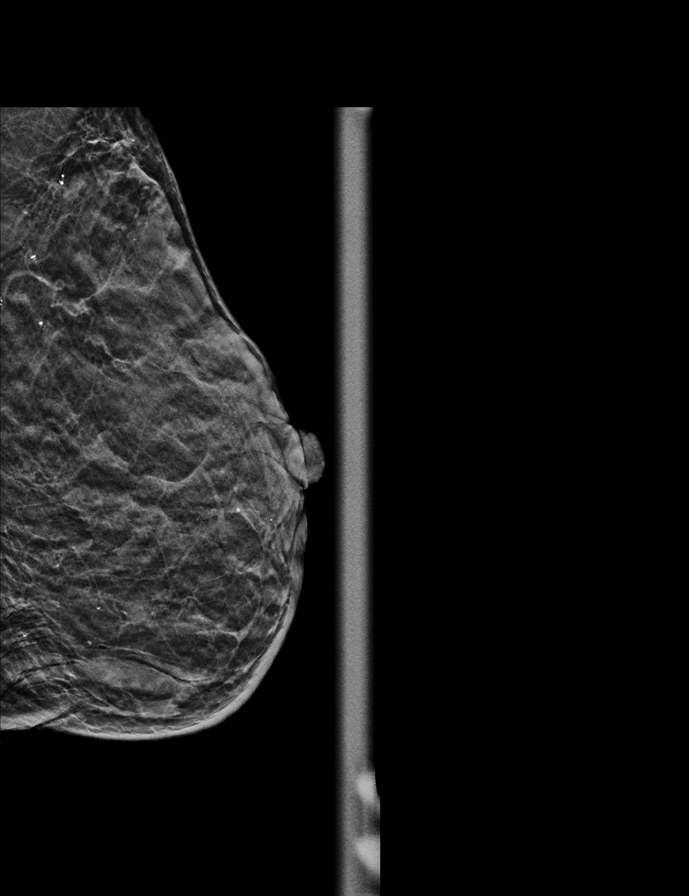

[R CC tomo · 2 of 23 frames shown]
[frame 8/23]
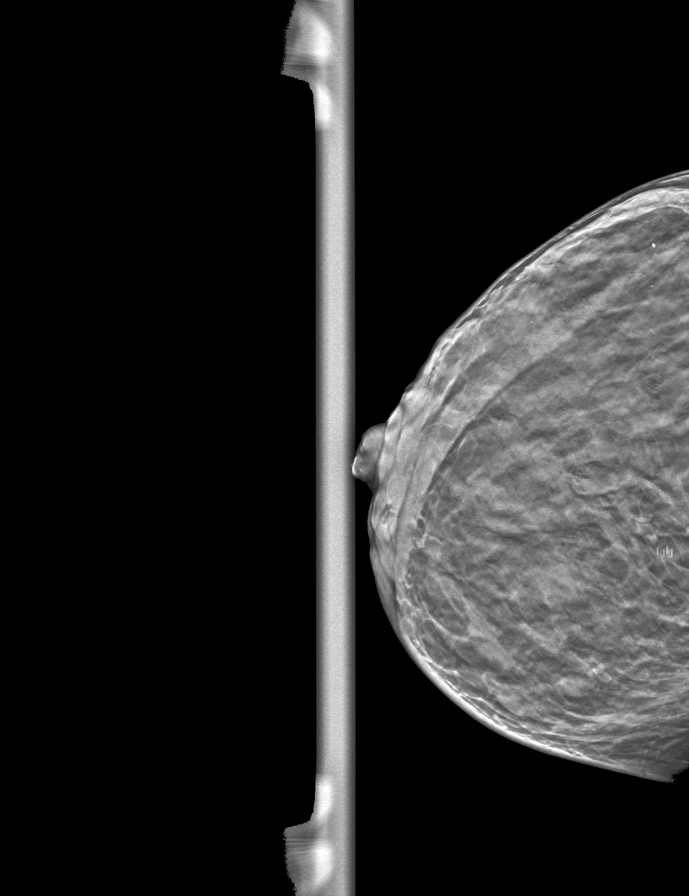
[frame 12/23]
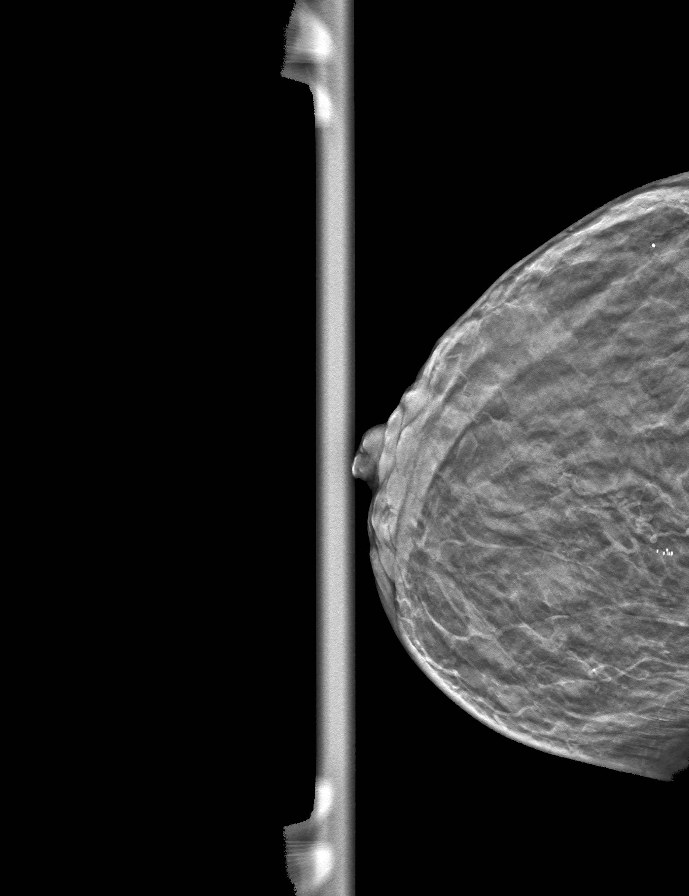

[L MLO tomo · tomo slice 12/23.0]
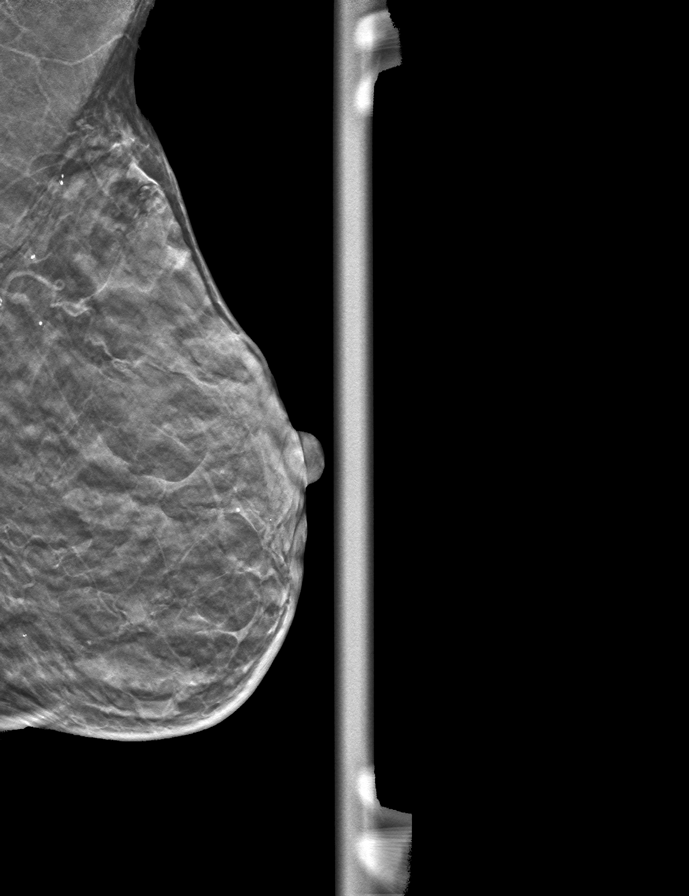

[L CC tomo · tomo slice 12/23.0]
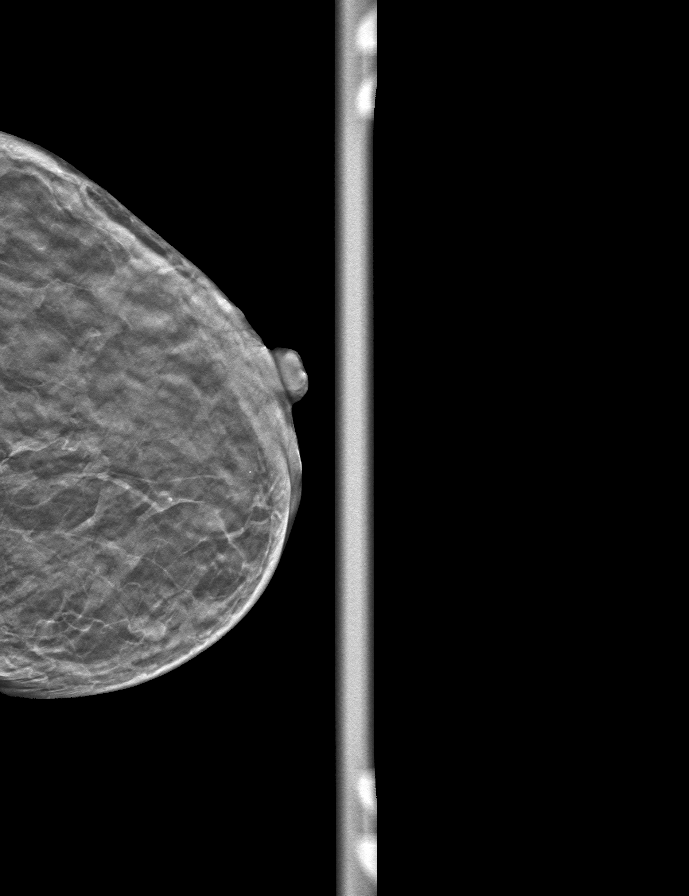

[R MLO tomo · tomo slice 13/24.0]
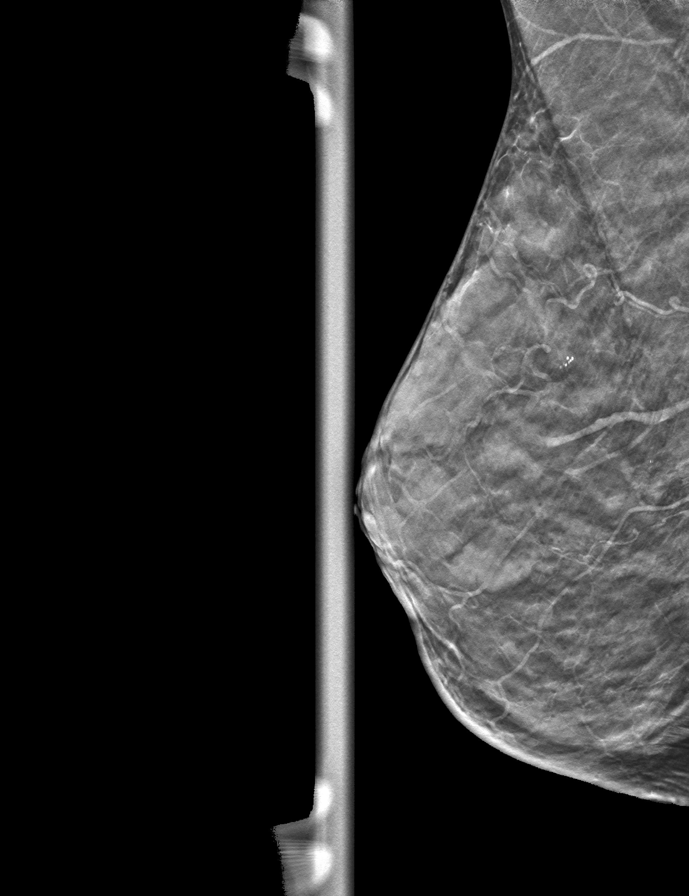

[9 of 24 positions shown; findings below may reference images not displayed]

ACR Breast Density Category d: The breast tissue is extremely dense,
which lowers the sensitivity of mammography.
FINDINGS: There are no findings suspicious for malignancy. Images were
processed with CAD.
IMPRESSION: No mammographic evidence of malignancy. A result letter of this
screening mammogram will be mailed directly to the patient.

RECOMMENDATION:
Screening mammogram in one year. (Code:[PU])

BI-RADS CATEGORY  1: Negative.

## 2018-08-21 DIAGNOSIS — J449 Chronic obstructive pulmonary disease, unspecified: Secondary | ICD-10-CM | POA: Diagnosis not present

## 2018-08-21 DIAGNOSIS — B20 Human immunodeficiency virus [HIV] disease: Secondary | ICD-10-CM | POA: Diagnosis not present

## 2018-08-21 DIAGNOSIS — I10 Essential (primary) hypertension: Secondary | ICD-10-CM | POA: Diagnosis not present

## 2018-08-22 DIAGNOSIS — B20 Human immunodeficiency virus [HIV] disease: Secondary | ICD-10-CM | POA: Diagnosis not present

## 2018-08-22 DIAGNOSIS — I10 Essential (primary) hypertension: Secondary | ICD-10-CM | POA: Diagnosis not present

## 2018-08-22 DIAGNOSIS — J449 Chronic obstructive pulmonary disease, unspecified: Secondary | ICD-10-CM | POA: Diagnosis not present

## 2018-08-22 MED FILL — BIKTARVY 50-200-25 MG TABS: 50-200-25 | 30 days supply | Qty: 30 | Fill #6

## 2018-08-23 DIAGNOSIS — I10 Essential (primary) hypertension: Secondary | ICD-10-CM | POA: Diagnosis not present

## 2018-08-23 DIAGNOSIS — B20 Human immunodeficiency virus [HIV] disease: Secondary | ICD-10-CM | POA: Diagnosis not present

## 2018-08-23 DIAGNOSIS — J449 Chronic obstructive pulmonary disease, unspecified: Secondary | ICD-10-CM | POA: Diagnosis not present

## 2018-08-24 DIAGNOSIS — I10 Essential (primary) hypertension: Secondary | ICD-10-CM | POA: Diagnosis not present

## 2018-08-24 DIAGNOSIS — B20 Human immunodeficiency virus [HIV] disease: Secondary | ICD-10-CM | POA: Diagnosis not present

## 2018-08-24 DIAGNOSIS — J449 Chronic obstructive pulmonary disease, unspecified: Secondary | ICD-10-CM | POA: Diagnosis not present

## 2018-08-25 DIAGNOSIS — B20 Human immunodeficiency virus [HIV] disease: Secondary | ICD-10-CM | POA: Diagnosis not present

## 2018-08-25 DIAGNOSIS — I10 Essential (primary) hypertension: Secondary | ICD-10-CM | POA: Diagnosis not present

## 2018-08-25 DIAGNOSIS — J449 Chronic obstructive pulmonary disease, unspecified: Secondary | ICD-10-CM | POA: Diagnosis not present

## 2018-08-26 DIAGNOSIS — J449 Chronic obstructive pulmonary disease, unspecified: Secondary | ICD-10-CM | POA: Diagnosis not present

## 2018-08-26 DIAGNOSIS — I10 Essential (primary) hypertension: Secondary | ICD-10-CM | POA: Diagnosis not present

## 2018-08-26 DIAGNOSIS — B20 Human immunodeficiency virus [HIV] disease: Secondary | ICD-10-CM | POA: Diagnosis not present

## 2018-08-27 DIAGNOSIS — B20 Human immunodeficiency virus [HIV] disease: Secondary | ICD-10-CM | POA: Diagnosis not present

## 2018-08-27 DIAGNOSIS — J449 Chronic obstructive pulmonary disease, unspecified: Secondary | ICD-10-CM | POA: Diagnosis not present

## 2018-08-27 DIAGNOSIS — I10 Essential (primary) hypertension: Secondary | ICD-10-CM | POA: Diagnosis not present

## 2018-08-28 DIAGNOSIS — B20 Human immunodeficiency virus [HIV] disease: Secondary | ICD-10-CM | POA: Diagnosis not present

## 2018-08-28 DIAGNOSIS — J449 Chronic obstructive pulmonary disease, unspecified: Secondary | ICD-10-CM | POA: Diagnosis not present

## 2018-08-28 DIAGNOSIS — I10 Essential (primary) hypertension: Secondary | ICD-10-CM | POA: Diagnosis not present

## 2018-08-29 ENCOUNTER — Other Ambulatory Visit: Payer: Self-pay | Admitting: Internal Medicine

## 2018-08-29 DIAGNOSIS — J449 Chronic obstructive pulmonary disease, unspecified: Secondary | ICD-10-CM | POA: Diagnosis not present

## 2018-08-29 DIAGNOSIS — E785 Hyperlipidemia, unspecified: Secondary | ICD-10-CM

## 2018-08-29 DIAGNOSIS — I10 Essential (primary) hypertension: Secondary | ICD-10-CM | POA: Diagnosis not present

## 2018-08-29 DIAGNOSIS — K2100 Gastro-esophageal reflux disease with esophagitis, without bleeding: Secondary | ICD-10-CM

## 2018-08-29 DIAGNOSIS — B20 Human immunodeficiency virus [HIV] disease: Secondary | ICD-10-CM | POA: Diagnosis not present

## 2018-08-30 DIAGNOSIS — J449 Chronic obstructive pulmonary disease, unspecified: Secondary | ICD-10-CM | POA: Diagnosis not present

## 2018-08-30 DIAGNOSIS — I10 Essential (primary) hypertension: Secondary | ICD-10-CM | POA: Diagnosis not present

## 2018-08-30 DIAGNOSIS — B20 Human immunodeficiency virus [HIV] disease: Secondary | ICD-10-CM | POA: Diagnosis not present

## 2018-08-31 DIAGNOSIS — B20 Human immunodeficiency virus [HIV] disease: Secondary | ICD-10-CM | POA: Diagnosis not present

## 2018-08-31 DIAGNOSIS — J449 Chronic obstructive pulmonary disease, unspecified: Secondary | ICD-10-CM | POA: Diagnosis not present

## 2018-08-31 DIAGNOSIS — I10 Essential (primary) hypertension: Secondary | ICD-10-CM | POA: Diagnosis not present

## 2018-09-01 DIAGNOSIS — I10 Essential (primary) hypertension: Secondary | ICD-10-CM | POA: Diagnosis not present

## 2018-09-01 DIAGNOSIS — B20 Human immunodeficiency virus [HIV] disease: Secondary | ICD-10-CM | POA: Diagnosis not present

## 2018-09-01 DIAGNOSIS — J449 Chronic obstructive pulmonary disease, unspecified: Secondary | ICD-10-CM | POA: Diagnosis not present

## 2018-09-02 DIAGNOSIS — B20 Human immunodeficiency virus [HIV] disease: Secondary | ICD-10-CM | POA: Diagnosis not present

## 2018-09-02 DIAGNOSIS — J449 Chronic obstructive pulmonary disease, unspecified: Secondary | ICD-10-CM | POA: Diagnosis not present

## 2018-09-02 DIAGNOSIS — I10 Essential (primary) hypertension: Secondary | ICD-10-CM | POA: Diagnosis not present

## 2018-09-03 DIAGNOSIS — I10 Essential (primary) hypertension: Secondary | ICD-10-CM | POA: Diagnosis not present

## 2018-09-03 DIAGNOSIS — B20 Human immunodeficiency virus [HIV] disease: Secondary | ICD-10-CM | POA: Diagnosis not present

## 2018-09-03 DIAGNOSIS — J449 Chronic obstructive pulmonary disease, unspecified: Secondary | ICD-10-CM | POA: Diagnosis not present

## 2018-09-04 DIAGNOSIS — I10 Essential (primary) hypertension: Secondary | ICD-10-CM | POA: Diagnosis not present

## 2018-09-04 DIAGNOSIS — B20 Human immunodeficiency virus [HIV] disease: Secondary | ICD-10-CM | POA: Diagnosis not present

## 2018-09-04 DIAGNOSIS — J449 Chronic obstructive pulmonary disease, unspecified: Secondary | ICD-10-CM | POA: Diagnosis not present

## 2018-09-05 ENCOUNTER — Telehealth: Payer: Self-pay

## 2018-09-05 ENCOUNTER — Other Ambulatory Visit: Payer: Self-pay

## 2018-09-05 DIAGNOSIS — I10 Essential (primary) hypertension: Secondary | ICD-10-CM | POA: Diagnosis not present

## 2018-09-05 DIAGNOSIS — M1612 Unilateral primary osteoarthritis, left hip: Secondary | ICD-10-CM

## 2018-09-05 DIAGNOSIS — B20 Human immunodeficiency virus [HIV] disease: Secondary | ICD-10-CM | POA: Diagnosis not present

## 2018-09-05 DIAGNOSIS — J449 Chronic obstructive pulmonary disease, unspecified: Secondary | ICD-10-CM | POA: Diagnosis not present

## 2018-09-05 MED ORDER — TRAMADOL HCL 50 MG PO TABS: 50.0000 mg | ORAL_TABLET | Freq: Two times a day (BID) | ORAL | 1 refills | Status: DC | PRN

## 2018-09-05 NOTE — Telephone Encounter (Signed)
Error/dupliate ?

## 2018-09-06 DIAGNOSIS — B20 Human immunodeficiency virus [HIV] disease: Secondary | ICD-10-CM | POA: Diagnosis not present

## 2018-09-06 DIAGNOSIS — I10 Essential (primary) hypertension: Secondary | ICD-10-CM | POA: Diagnosis not present

## 2018-09-06 DIAGNOSIS — J449 Chronic obstructive pulmonary disease, unspecified: Secondary | ICD-10-CM | POA: Diagnosis not present

## 2018-09-07 ENCOUNTER — Emergency Department (HOSPITAL_COMMUNITY)
Admission: EM | Admit: 2018-09-07 | Discharge: 2018-09-07 | Disposition: A | Discharge status: Home / Self Care | Payer: Medicaid Other | Attending: Emergency Medicine | Admitting: Emergency Medicine

## 2018-09-07 ENCOUNTER — Other Ambulatory Visit: Payer: Self-pay

## 2018-09-07 ENCOUNTER — Encounter (HOSPITAL_COMMUNITY): Payer: Self-pay | Admitting: Emergency Medicine

## 2018-09-07 DIAGNOSIS — J441 Chronic obstructive pulmonary disease with (acute) exacerbation: Secondary | ICD-10-CM | POA: Insufficient documentation

## 2018-09-07 DIAGNOSIS — Z79899 Other long term (current) drug therapy: Secondary | ICD-10-CM | POA: Insufficient documentation

## 2018-09-07 DIAGNOSIS — I10 Essential (primary) hypertension: Secondary | ICD-10-CM | POA: Diagnosis not present

## 2018-09-07 DIAGNOSIS — J449 Chronic obstructive pulmonary disease, unspecified: Secondary | ICD-10-CM | POA: Diagnosis not present

## 2018-09-07 DIAGNOSIS — K649 Unspecified hemorrhoids: Secondary | ICD-10-CM

## 2018-09-07 DIAGNOSIS — Z87891 Personal history of nicotine dependence: Secondary | ICD-10-CM | POA: Insufficient documentation

## 2018-09-07 DIAGNOSIS — B20 Human immunodeficiency virus [HIV] disease: Secondary | ICD-10-CM | POA: Diagnosis not present

## 2018-09-07 MED ORDER — HYDROCORTISONE 2.5 % RE CREA: TOPICAL_CREAM | RECTAL | 0 refills | Status: DC

## 2018-09-07 NOTE — ED Triage Notes (Addendum)
Pt. Stated, I think my hemorrhoids have fallen down, started last night

## 2018-09-07 NOTE — ED Provider Notes (Signed)
[facility] Provider Note   CSN: [id number] Arrival date & time: 09/07/18  1257     History   Chief Complaint Chief Complaint  Patient presents with  . Hemorrhoids    HPI Janice Williams is a 57 y.o. female.  57 year old female presents with complaint of hemorrhoids.  Patient states that she was straining for a bowel movement earlier and now feels like she has a hemorrhoid.  Patient has applied Preparation H without relief of the discomfort and itching in her rectal area.  Denies blood in her stools or rectal bleeding.  No other complaints or concerns.     Past Medical History:  Diagnosis Date  . Arthritis   . HIV infection ([place])   . Hypertension   . Stroke ([place])   . 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 Moderate protein-calorie malnutrition ([place]) 05/31/2018  . COPD with acute exacerbation ([place]) 06/09/2017  . Sinusitis 06/08/2017  . Hypertension 06/08/2017  . Dental abscess 03/15/2015  . Knee pain, bilateral 03/15/2015  . Acne cystica 12/22/2011  . Dyslipidemia 07/06/2011  . Routine health maintenance 07/06/2011  . EPIDERMOID CYST 02/10/2011  . ACUTE SINUSITIS, UNSPECIFIED 01/20/2010  . EXTERNAL OTITIS 01/06/2010  . Hyperlipidemia 06/03/2009  . WEIGHT LOSS 06/03/2009  . CARBUNCLE AND FURUNCLE OF FACE 02/04/2009  . FACIAL RASH 02/04/2009  . Human immunodeficiency virus (HIV) disease ([place]) 10/29/2008  . ANEMIA-NOS 10/29/2008  . PERIPHERAL NEUROPATHY 10/29/2008  . GERD 10/29/2008  . RENAL INSUFFICIENCY 10/29/2008  . CEREBROVASCULAR ACCIDENT, HX OF 10/29/2008  . TOBACCO DEPENDENCE 02/14/2007  . CVA 02/14/2007  . RHINITIS, ALLERGIC 02/14/2007  . DYSPEPSIA 02/14/2007  . ACNE 02/14/2007  . WEIGHT LOSS, ABNORMAL 02/14/2007    History reviewed. No pertinent surgical history.   OB History   None      Home Medications    Prior to Admission  medications   Medication Sig Start Date End Date Taking? Authorizing Provider  albuterol (PROVENTIL HFA;VENTOLIN HFA) 108 (90 Base) MCG/ACT inhaler Inhale 2 puffs into the lungs every 6 (six) hours as needed for wheezing or shortness of breath. 01/08/18   Carlyle Basques, MD  alendronate (FOSAMAX) 70 MG tablet TAKE 1 TABLET BY MOUTH ONCE A WEEK. TAKE WITH A FULL GLASS OF WATER ON EMPTY STOMACH 01/25/17   Carlyle Basques, MD  amLODipine (NORVASC) 10 MG tablet TAKE ONE TABLET BY MOUTH DAILY 05/02/18   Carlyle Basques, MD  BIKTARVY 50-200-25 MG TABS tablet TAKE 1 TABLET BY MOUTH DAILY. 02/14/18   Carlyle Basques, MD  calcium-vitamin D (OSCAL WITH D) 500-200 MG-UNIT tablet Take 1 tablet by mouth 2 (two) times daily. 01/25/17   Carlyle Basques, MD  dronabinol (MARINOL) 5 MG capsule Take 1 capsule (5 mg total) by mouth 2 (two) times daily before a meal. 07/10/18   Carlyle Basques, MD  ENSURE (ENSURE) Take 237 mLs by mouth 3 (three) times daily between meals. 07/10/18   Carlyle Basques, MD  fluticasone [facility]) 50 MCG/ACT nasal spray INSTILL 1 SPRAY INTO BOTH NOSTRILS DAILY 08/09/18   Carlyle Basques, MD  hydrocortisone (ANUSOL-HC) 2.5 % rectal cream Apply rectally 2 times daily 09/07/18   Suella Broad A, PA-C  loratadine (CLARITIN) 10 MG tablet Take 1 tablet (10 mg total) by mouth daily. 06/10/17   Jule Ser, DO  omeprazole (PRILOSEC) 20 MG capsule TAKE 1 CAPSULE BY MOUTH EVERY DAY 08/29/18  Carlyle Basques, MD  ondansetron (ZOFRAN) 4 MG tablet Take 1 tablet (4 mg total) by mouth every 8 (eight) hours as needed for nausea or vomiting. 10/08/17   Carlyle Basques, MD  polyethylene glycol [facility] / Floria Raveling) packet Take one capful in 8 oz of fluid of your choice twice a day until having soft stools, then back down to once a day.  If not having soft stools with twice a day, increased to three times a day Patient taking differently: Take 17 g by mouth 2 (two) times daily as needed for mild constipation.  07/21/15    Linton Flemings, MD  pramoxine (PROCTOFOAM) 1 % foam Place 1 application rectally 3 (three) times daily as needed for itching. 05/18/16   Carlyle Basques, MD  pravastatin (PRAVACHOL) 20 MG tablet TAKE 1 TABLET BY MOUTH EVERY DAY 08/29/18   Carlyle Basques, MD  sodium chloride (OCEAN) 0.65 % SOLN nasal spray Place 2 sprays into both nostrils 3 (three) times daily. 06/10/17   Jule Ser, DO  traMADol (ULTRAM) 50 MG tablet Take 1 tablet (50 mg total) by mouth every 12 (twelve) hours as needed. 09/05/18   Carlyle Basques, MD  Vitamin D, Ergocalciferol, (DRISDOL) 50000 units CAPS capsule TAKE 1 CAPSULE BY MOUTH EVERY 7 DAYS 08/22/17   Carlyle Basques, MD  Witch Hazel (TUCKS) 50 % PADS Use as needed for rectal itching, after having BM 07/21/15   Linton Flemings, MD    Family History Family History  Problem Relation Age of Onset  . Hypertension Mother   . Hypertension Father   . Hyperlipidemia Father     Social History Social History   Tobacco Use  . Smoking status: Former Smoker    Packs/day: 0.40    Types: Cigarettes    Start date: 12/18/2012  . Smokeless tobacco: Never Used  Substance Use Topics  . Alcohol use: Yes    Alcohol/week: 0.0 standard drinks    Comment: "occasional"   . Drug use: No     Allergies   Chantix [varenicline]   Review of Systems Review of Systems  Constitutional: Negative for fever.  Gastrointestinal: Positive for constipation. Negative for abdominal pain.  Genitourinary: Negative for dysuria, frequency and urgency.  Skin: Negative for rash and wound.  Allergic/Immunologic: Positive for immunocompromised state.  Hematological: Does not bruise/bleed easily.  Psychiatric/Behavioral: Negative for confusion.  All other systems reviewed and are negative.    Physical Exam Updated Vital Signs BP 130/76 (BP Location: Right Arm)   Pulse (!) 59   Temp 98.8 F (37.1 C) (Oral)   Resp 18   Ht 5\' 4"  (1.626 m)   Wt 47.6 kg   SpO2 100%   BMI 18.02 kg/m   Physical  Exam  Constitutional: She is oriented to person, place, and time. She appears well-developed and well-nourished. No distress.  HENT:  Head: Normocephalic and atraumatic.  Pulmonary/Chest: Effort normal.  Abdominal: Soft. She exhibits no distension. There is no tenderness.  Genitourinary:     Neurological: She is alert and oriented to person, place, and time.  Skin: Skin is warm and dry. She is not diaphoretic.  Psychiatric: She has a normal mood and affect. Her behavior is normal.  Nursing note and vitals reviewed.    ED Treatments / Results  Labs (all labs ordered are listed, but only abnormal results are displayed) Labs Reviewed - No data to display  EKG None  Radiology No results found.  Procedures Procedures (including critical care time)  Medications Ordered in ED  Medications - No data to display   Initial Impression / Assessment and Plan / ED Course  I have reviewed the triage vital signs and the nursing notes.  Pertinent labs & imaging results that were available during my care of the patient were reviewed by me and considered in my medical decision making (see chart for details).  Clinical Course as of Sep 07 1449  Sat Sep 07, 1849  6547 67-year-old female presents with complaint of hemorrhoid.  On exam there is 1 tender nonthrombosed hemorrhoid present.  Recommend Anusol, if this is not covered by her insurance she can use OTC steroid.  Also recommend Tucks pads, Colace and MiraLAX.  Follow-up with PCP.   [LM]    Clinical Course User Index [LM] Tacy Learn, PA-C    Final Clinical Impressions(s) / ED Diagnoses   Final diagnoses:  Hemorrhoids, unspecified hemorrhoid type    ED Discharge Orders         Ordered    hydrocortisone (ANUSOL-HC) 2.5 % rectal cream     09/07/18 1445           Tacy Learn, PA-C 09/07/18 1450    [place], Gwenyth Allegra, MD 09/08/18 [phone]

## 2018-09-07 NOTE — Discharge Instructions (Addendum)
Take Colace and MiraLAX to help keep stools soft. Apply steroid cream to rectal area as prescribed.  If the prescription cream is not covered by your insurance, as the pharmacist where the available over-the-counter option is. Placed Tucks pads in the refrigerator to keep them cool, apply to area as needed.

## 2018-09-08 DIAGNOSIS — I10 Essential (primary) hypertension: Secondary | ICD-10-CM | POA: Diagnosis not present

## 2018-09-08 DIAGNOSIS — J449 Chronic obstructive pulmonary disease, unspecified: Secondary | ICD-10-CM | POA: Diagnosis not present

## 2018-09-08 DIAGNOSIS — B20 Human immunodeficiency virus [HIV] disease: Secondary | ICD-10-CM | POA: Diagnosis not present

## 2018-09-09 DIAGNOSIS — B20 Human immunodeficiency virus [HIV] disease: Secondary | ICD-10-CM | POA: Diagnosis not present

## 2018-09-09 DIAGNOSIS — J449 Chronic obstructive pulmonary disease, unspecified: Secondary | ICD-10-CM | POA: Diagnosis not present

## 2018-09-09 DIAGNOSIS — I10 Essential (primary) hypertension: Secondary | ICD-10-CM | POA: Diagnosis not present

## 2018-09-10 ENCOUNTER — Other Ambulatory Visit: Payer: Self-pay | Admitting: Internal Medicine

## 2018-09-10 DIAGNOSIS — I10 Essential (primary) hypertension: Secondary | ICD-10-CM | POA: Diagnosis not present

## 2018-09-10 DIAGNOSIS — J449 Chronic obstructive pulmonary disease, unspecified: Secondary | ICD-10-CM | POA: Diagnosis not present

## 2018-09-10 DIAGNOSIS — B20 Human immunodeficiency virus [HIV] disease: Secondary | ICD-10-CM | POA: Diagnosis not present

## 2018-09-11 DIAGNOSIS — J449 Chronic obstructive pulmonary disease, unspecified: Secondary | ICD-10-CM | POA: Diagnosis not present

## 2018-09-11 DIAGNOSIS — B20 Human immunodeficiency virus [HIV] disease: Secondary | ICD-10-CM | POA: Diagnosis not present

## 2018-09-11 DIAGNOSIS — I10 Essential (primary) hypertension: Secondary | ICD-10-CM | POA: Diagnosis not present

## 2018-09-12 DIAGNOSIS — B20 Human immunodeficiency virus [HIV] disease: Secondary | ICD-10-CM | POA: Diagnosis not present

## 2018-09-12 DIAGNOSIS — I10 Essential (primary) hypertension: Secondary | ICD-10-CM | POA: Diagnosis not present

## 2018-09-12 DIAGNOSIS — J449 Chronic obstructive pulmonary disease, unspecified: Secondary | ICD-10-CM | POA: Diagnosis not present

## 2018-09-13 DIAGNOSIS — J449 Chronic obstructive pulmonary disease, unspecified: Secondary | ICD-10-CM | POA: Diagnosis not present

## 2018-09-13 DIAGNOSIS — B20 Human immunodeficiency virus [HIV] disease: Secondary | ICD-10-CM | POA: Diagnosis not present

## 2018-09-13 DIAGNOSIS — I10 Essential (primary) hypertension: Secondary | ICD-10-CM | POA: Diagnosis not present

## 2018-09-14 DIAGNOSIS — J449 Chronic obstructive pulmonary disease, unspecified: Secondary | ICD-10-CM | POA: Diagnosis not present

## 2018-09-14 DIAGNOSIS — B20 Human immunodeficiency virus [HIV] disease: Secondary | ICD-10-CM | POA: Diagnosis not present

## 2018-09-14 DIAGNOSIS — I10 Essential (primary) hypertension: Secondary | ICD-10-CM | POA: Diagnosis not present

## 2018-09-15 ENCOUNTER — Emergency Department (HOSPITAL_COMMUNITY)
Admission: EM | Admit: 2018-09-15 | Discharge: 2018-09-15 | Disposition: A | Discharge status: Home / Self Care | Payer: Medicaid Other | Attending: Emergency Medicine | Admitting: Emergency Medicine

## 2018-09-15 ENCOUNTER — Encounter (HOSPITAL_COMMUNITY): Payer: Self-pay | Admitting: Emergency Medicine

## 2018-09-15 ENCOUNTER — Other Ambulatory Visit: Payer: Self-pay

## 2018-09-15 DIAGNOSIS — K644 Residual hemorrhoidal skin tags: Secondary | ICD-10-CM

## 2018-09-15 DIAGNOSIS — Z79899 Other long term (current) drug therapy: Secondary | ICD-10-CM | POA: Diagnosis not present

## 2018-09-15 DIAGNOSIS — I10 Essential (primary) hypertension: Secondary | ICD-10-CM | POA: Diagnosis not present

## 2018-09-15 DIAGNOSIS — Z87891 Personal history of nicotine dependence: Secondary | ICD-10-CM | POA: Insufficient documentation

## 2018-09-15 DIAGNOSIS — J449 Chronic obstructive pulmonary disease, unspecified: Secondary | ICD-10-CM | POA: Insufficient documentation

## 2018-09-15 DIAGNOSIS — B20 Human immunodeficiency virus [HIV] disease: Secondary | ICD-10-CM | POA: Diagnosis not present

## 2018-09-15 DIAGNOSIS — K648 Other hemorrhoids: Secondary | ICD-10-CM | POA: Insufficient documentation

## 2018-09-15 MED ORDER — LIDOCAINE HCL URETHRAL/MUCOSAL 2 % EX GEL
1.0000 "application " | Freq: Once | CUTANEOUS | Status: AC
  Administered 2018-09-15: 1 via TOPICAL
  Filled 2018-09-15: qty 20

## 2018-09-15 NOTE — Discharge Instructions (Addendum)
You can take miralax or colace, available over the counter for constipation.  Drink plenty of fluids.
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
## 2018-09-15 NOTE — ED Triage Notes (Signed)
C/o hemorrhoids x 1 week.  States she was seen here last Saturday for same.

## 2018-09-16 ENCOUNTER — Telehealth: Payer: Self-pay

## 2018-09-16 DIAGNOSIS — B20 Human immunodeficiency virus [HIV] disease: Secondary | ICD-10-CM | POA: Diagnosis not present

## 2018-09-16 DIAGNOSIS — J449 Chronic obstructive pulmonary disease, unspecified: Secondary | ICD-10-CM | POA: Diagnosis not present

## 2018-09-16 DIAGNOSIS — I10 Essential (primary) hypertension: Secondary | ICD-10-CM | POA: Diagnosis not present

## 2018-09-16 NOTE — Telephone Encounter (Signed)
Patient called today requesting refills for Przmoxine foam to be called into [facility]. Patient stated she has been seen in Ed twice in two weeks due to itching caused from hemorrhoids. Will ask Dr. Baxter Flattery if it would be okay to refill medication. [place]

## 2018-09-17 ENCOUNTER — Other Ambulatory Visit: Payer: Self-pay

## 2018-09-17 DIAGNOSIS — I10 Essential (primary) hypertension: Secondary | ICD-10-CM | POA: Diagnosis not present

## 2018-09-17 DIAGNOSIS — J449 Chronic obstructive pulmonary disease, unspecified: Secondary | ICD-10-CM | POA: Diagnosis not present

## 2018-09-17 DIAGNOSIS — K649 Unspecified hemorrhoids: Secondary | ICD-10-CM

## 2018-09-17 DIAGNOSIS — B20 Human immunodeficiency virus [HIV] disease: Secondary | ICD-10-CM | POA: Diagnosis not present

## 2018-09-17 MED ORDER — PRAMOXINE HCL 1 % RE FOAM: 1.0000 "application " | Freq: Three times a day (TID) | RECTAL | 3 refills | Status: DC | PRN

## 2018-09-17 NOTE — Telephone Encounter (Signed)
Refill sent in per Dr. Baxter Flattery to [facility]. Patient is aware and will head to the pharmacy later today. [place]

## 2018-09-17 NOTE — Telephone Encounter (Signed)
Okay to refill? 

## 2018-09-18 ENCOUNTER — Other Ambulatory Visit: Payer: Self-pay | Admitting: *Deleted

## 2018-09-18 ENCOUNTER — Telehealth: Payer: Self-pay | Admitting: *Deleted

## 2018-09-18 DIAGNOSIS — I10 Essential (primary) hypertension: Secondary | ICD-10-CM | POA: Diagnosis not present

## 2018-09-18 DIAGNOSIS — K649 Unspecified hemorrhoids: Secondary | ICD-10-CM

## 2018-09-18 DIAGNOSIS — B20 Human immunodeficiency virus [HIV] disease: Secondary | ICD-10-CM | POA: Diagnosis not present

## 2018-09-18 DIAGNOSIS — J449 Chronic obstructive pulmonary disease, unspecified: Secondary | ICD-10-CM | POA: Diagnosis not present

## 2018-09-18 MED FILL — FLUTICASONE PROP 50 MCG SPR: 50 | 30 days supply | Qty: 16 | Fill #0

## 2018-09-18 MED FILL — BIKTARVY 50-200-25 MG TABS: 50-200-25 | 30 days supply | Qty: 30 | Fill #0

## 2018-09-18 NOTE — Telephone Encounter (Signed)
error 

## 2018-09-19 DIAGNOSIS — J449 Chronic obstructive pulmonary disease, unspecified: Secondary | ICD-10-CM | POA: Diagnosis not present

## 2018-09-19 DIAGNOSIS — B20 Human immunodeficiency virus [HIV] disease: Secondary | ICD-10-CM | POA: Diagnosis not present

## 2018-09-19 DIAGNOSIS — I10 Essential (primary) hypertension: Secondary | ICD-10-CM | POA: Diagnosis not present

## 2018-09-20 DIAGNOSIS — K649 Unspecified hemorrhoids: Secondary | ICD-10-CM | POA: Diagnosis not present

## 2018-09-20 DIAGNOSIS — J449 Chronic obstructive pulmonary disease, unspecified: Secondary | ICD-10-CM | POA: Diagnosis not present

## 2018-09-20 DIAGNOSIS — I10 Essential (primary) hypertension: Secondary | ICD-10-CM | POA: Diagnosis not present

## 2018-09-20 DIAGNOSIS — B20 Human immunodeficiency virus [HIV] disease: Secondary | ICD-10-CM | POA: Diagnosis not present

## 2018-09-21 DIAGNOSIS — I10 Essential (primary) hypertension: Secondary | ICD-10-CM | POA: Diagnosis not present

## 2018-09-21 DIAGNOSIS — J449 Chronic obstructive pulmonary disease, unspecified: Secondary | ICD-10-CM | POA: Diagnosis not present

## 2018-09-21 DIAGNOSIS — B20 Human immunodeficiency virus [HIV] disease: Secondary | ICD-10-CM | POA: Diagnosis not present

## 2018-09-22 DIAGNOSIS — J449 Chronic obstructive pulmonary disease, unspecified: Secondary | ICD-10-CM | POA: Diagnosis not present

## 2018-09-22 DIAGNOSIS — B20 Human immunodeficiency virus [HIV] disease: Secondary | ICD-10-CM | POA: Diagnosis not present

## 2018-09-22 DIAGNOSIS — I10 Essential (primary) hypertension: Secondary | ICD-10-CM | POA: Diagnosis not present

## 2018-09-23 DIAGNOSIS — B20 Human immunodeficiency virus [HIV] disease: Secondary | ICD-10-CM | POA: Diagnosis not present

## 2018-09-23 DIAGNOSIS — J449 Chronic obstructive pulmonary disease, unspecified: Secondary | ICD-10-CM | POA: Diagnosis not present

## 2018-09-23 DIAGNOSIS — I10 Essential (primary) hypertension: Secondary | ICD-10-CM | POA: Diagnosis not present

## 2018-09-24 DIAGNOSIS — B20 Human immunodeficiency virus [HIV] disease: Secondary | ICD-10-CM | POA: Diagnosis not present

## 2018-09-24 DIAGNOSIS — I10 Essential (primary) hypertension: Secondary | ICD-10-CM | POA: Diagnosis not present

## 2018-09-24 DIAGNOSIS — J449 Chronic obstructive pulmonary disease, unspecified: Secondary | ICD-10-CM | POA: Diagnosis not present

## 2018-09-25 DIAGNOSIS — I10 Essential (primary) hypertension: Secondary | ICD-10-CM | POA: Diagnosis not present

## 2018-09-25 DIAGNOSIS — J449 Chronic obstructive pulmonary disease, unspecified: Secondary | ICD-10-CM | POA: Diagnosis not present

## 2018-09-25 DIAGNOSIS — B20 Human immunodeficiency virus [HIV] disease: Secondary | ICD-10-CM | POA: Diagnosis not present

## 2018-09-26 DIAGNOSIS — J449 Chronic obstructive pulmonary disease, unspecified: Secondary | ICD-10-CM | POA: Diagnosis not present

## 2018-09-26 DIAGNOSIS — I10 Essential (primary) hypertension: Secondary | ICD-10-CM | POA: Diagnosis not present

## 2018-09-26 DIAGNOSIS — B20 Human immunodeficiency virus [HIV] disease: Secondary | ICD-10-CM | POA: Diagnosis not present

## 2018-09-27 DIAGNOSIS — J449 Chronic obstructive pulmonary disease, unspecified: Secondary | ICD-10-CM | POA: Diagnosis not present

## 2018-09-27 DIAGNOSIS — B20 Human immunodeficiency virus [HIV] disease: Secondary | ICD-10-CM | POA: Diagnosis not present

## 2018-09-27 DIAGNOSIS — I10 Essential (primary) hypertension: Secondary | ICD-10-CM | POA: Diagnosis not present

## 2018-09-28 DIAGNOSIS — I10 Essential (primary) hypertension: Secondary | ICD-10-CM | POA: Diagnosis not present

## 2018-09-28 DIAGNOSIS — B20 Human immunodeficiency virus [HIV] disease: Secondary | ICD-10-CM | POA: Diagnosis not present

## 2018-09-28 DIAGNOSIS — J449 Chronic obstructive pulmonary disease, unspecified: Secondary | ICD-10-CM | POA: Diagnosis not present

## 2018-09-29 DIAGNOSIS — I10 Essential (primary) hypertension: Secondary | ICD-10-CM | POA: Diagnosis not present

## 2018-09-29 DIAGNOSIS — J449 Chronic obstructive pulmonary disease, unspecified: Secondary | ICD-10-CM | POA: Diagnosis not present

## 2018-09-29 DIAGNOSIS — B20 Human immunodeficiency virus [HIV] disease: Secondary | ICD-10-CM | POA: Diagnosis not present

## 2018-09-30 DIAGNOSIS — B20 Human immunodeficiency virus [HIV] disease: Secondary | ICD-10-CM | POA: Diagnosis not present

## 2018-09-30 DIAGNOSIS — J449 Chronic obstructive pulmonary disease, unspecified: Secondary | ICD-10-CM | POA: Diagnosis not present

## 2018-09-30 DIAGNOSIS — I10 Essential (primary) hypertension: Secondary | ICD-10-CM | POA: Diagnosis not present

## 2018-10-01 DIAGNOSIS — B20 Human immunodeficiency virus [HIV] disease: Secondary | ICD-10-CM | POA: Diagnosis not present

## 2018-10-01 DIAGNOSIS — I10 Essential (primary) hypertension: Secondary | ICD-10-CM | POA: Diagnosis not present

## 2018-10-01 DIAGNOSIS — J449 Chronic obstructive pulmonary disease, unspecified: Secondary | ICD-10-CM | POA: Diagnosis not present

## 2018-10-02 DIAGNOSIS — I10 Essential (primary) hypertension: Secondary | ICD-10-CM | POA: Diagnosis not present

## 2018-10-02 DIAGNOSIS — J449 Chronic obstructive pulmonary disease, unspecified: Secondary | ICD-10-CM | POA: Diagnosis not present

## 2018-10-02 DIAGNOSIS — B20 Human immunodeficiency virus [HIV] disease: Secondary | ICD-10-CM | POA: Diagnosis not present

## 2018-10-03 DIAGNOSIS — J449 Chronic obstructive pulmonary disease, unspecified: Secondary | ICD-10-CM | POA: Diagnosis not present

## 2018-10-03 DIAGNOSIS — B20 Human immunodeficiency virus [HIV] disease: Secondary | ICD-10-CM | POA: Diagnosis not present

## 2018-10-03 DIAGNOSIS — I10 Essential (primary) hypertension: Secondary | ICD-10-CM | POA: Diagnosis not present

## 2018-10-03 DIAGNOSIS — K649 Unspecified hemorrhoids: Secondary | ICD-10-CM | POA: Diagnosis not present

## 2018-10-04 ENCOUNTER — Other Ambulatory Visit: Payer: Self-pay

## 2018-10-04 DIAGNOSIS — B20 Human immunodeficiency virus [HIV] disease: Secondary | ICD-10-CM | POA: Diagnosis not present

## 2018-10-04 DIAGNOSIS — M1612 Unilateral primary osteoarthritis, left hip: Secondary | ICD-10-CM

## 2018-10-04 DIAGNOSIS — J449 Chronic obstructive pulmonary disease, unspecified: Secondary | ICD-10-CM | POA: Diagnosis not present

## 2018-10-04 DIAGNOSIS — I10 Essential (primary) hypertension: Secondary | ICD-10-CM | POA: Diagnosis not present

## 2018-10-04 MED ORDER — TRAMADOL HCL 50 MG PO TABS: 50.0000 mg | ORAL_TABLET | Freq: Two times a day (BID) | ORAL | 0 refills | Status: DC | PRN

## 2018-10-04 NOTE — Progress Notes (Signed)
Patient called today requesting refill on Tramadol 50 mg. Patient is taking her last tablet today and would like refill called into [facility] on [address]  Called pharmacy with a verbal refill for patient. Pharmacist was able to take and confirm refill for patient. Will have medication ready for patient to pick up.  Will inform patient refill was called in. [place]

## 2018-10-05 DIAGNOSIS — B20 Human immunodeficiency virus [HIV] disease: Secondary | ICD-10-CM | POA: Diagnosis not present

## 2018-10-05 DIAGNOSIS — I10 Essential (primary) hypertension: Secondary | ICD-10-CM | POA: Diagnosis not present

## 2018-10-05 DIAGNOSIS — J449 Chronic obstructive pulmonary disease, unspecified: Secondary | ICD-10-CM | POA: Diagnosis not present

## 2018-10-06 DIAGNOSIS — I10 Essential (primary) hypertension: Secondary | ICD-10-CM | POA: Diagnosis not present

## 2018-10-06 DIAGNOSIS — B20 Human immunodeficiency virus [HIV] disease: Secondary | ICD-10-CM | POA: Diagnosis not present

## 2018-10-06 DIAGNOSIS — J449 Chronic obstructive pulmonary disease, unspecified: Secondary | ICD-10-CM | POA: Diagnosis not present

## 2018-10-07 ENCOUNTER — Other Ambulatory Visit: Payer: Self-pay | Admitting: *Deleted

## 2018-10-07 ENCOUNTER — Telehealth: Payer: Self-pay | Admitting: *Deleted

## 2018-10-07 DIAGNOSIS — I10 Essential (primary) hypertension: Secondary | ICD-10-CM | POA: Diagnosis not present

## 2018-10-07 DIAGNOSIS — J449 Chronic obstructive pulmonary disease, unspecified: Secondary | ICD-10-CM | POA: Diagnosis not present

## 2018-10-07 DIAGNOSIS — B20 Human immunodeficiency virus [HIV] disease: Secondary | ICD-10-CM | POA: Diagnosis not present

## 2018-10-07 NOTE — Telephone Encounter (Signed)
Received a call today from Ms. Kvamme stating she received my number from [facility] and wanted to know if I could help her with getting her medications. Ms. Lockamy stated she has ran out of money and needs some miralax and OTC medication for her hemorrhoids.  I agreed to help offering a generic brand of miralax that I can bring to her and asked if she would send me additional information about the type of hemorrhoidal cream that was recommended. Ms Najarian stated she will send me the information. After speaking with Ms. Weinheimer, I noted in the chart that Dr Baxter Flattery approved Proctofoam on Oct 1st. Contacted [facility] medication cost of Proctofoam OTC is 98.98 and it must be ordered.    Contacted Dr Baxter Flattery to see if we can do the Proctofoam HC (the difference is HC includes Hydrocortisone acetate 1% and pramoxine hydrochloride 1%) and maybe covered by medicaid.

## 2018-10-08 ENCOUNTER — Telehealth: Payer: Self-pay | Admitting: *Deleted

## 2018-10-08 DIAGNOSIS — B20 Human immunodeficiency virus [HIV] disease: Secondary | ICD-10-CM | POA: Diagnosis not present

## 2018-10-08 DIAGNOSIS — I10 Essential (primary) hypertension: Secondary | ICD-10-CM | POA: Diagnosis not present

## 2018-10-08 DIAGNOSIS — J449 Chronic obstructive pulmonary disease, unspecified: Secondary | ICD-10-CM | POA: Diagnosis not present

## 2018-10-08 NOTE — Telephone Encounter (Signed)
Contacted [place] [facility]/Medicaid for prior authorization of marinol.   Request has been submitted and is up for review. May take up to 24hrs.  PA number: [id number] Reference: [id number]

## 2018-10-08 NOTE — Telephone Encounter (Signed)
During my visit with Janice Williams she expressed that she would love to gain wright and the marinol that she once received really helped. She went on to explain that she can eat one or two bites of food and then she feels full. She states about 3 months ago she spoke with the pharmacy and they needed a prior authorization for the marinol. She hasn't heard anything since. She states she has been free from substance abuse for many years and is active in her church. She feels convicted but the use of illegal substances to boost her appetite and would rather take the marinol. She asked me if I would follow up on the prescription for her.   Contacted [facility] today in reference to the [place]. The pharmacy associate stated the are waiting on a PA so I asked her to please resubmit the request.

## 2018-10-09 DIAGNOSIS — J449 Chronic obstructive pulmonary disease, unspecified: Secondary | ICD-10-CM | POA: Diagnosis not present

## 2018-10-09 DIAGNOSIS — I10 Essential (primary) hypertension: Secondary | ICD-10-CM | POA: Diagnosis not present

## 2018-10-09 DIAGNOSIS — B20 Human immunodeficiency virus [HIV] disease: Secondary | ICD-10-CM | POA: Diagnosis not present

## 2018-10-10 ENCOUNTER — Other Ambulatory Visit: Payer: Medicaid Other | Admitting: *Deleted

## 2018-10-10 DIAGNOSIS — I10 Essential (primary) hypertension: Secondary | ICD-10-CM | POA: Diagnosis not present

## 2018-10-10 DIAGNOSIS — B20 Human immunodeficiency virus [HIV] disease: Secondary | ICD-10-CM | POA: Diagnosis not present

## 2018-10-10 DIAGNOSIS — J449 Chronic obstructive pulmonary disease, unspecified: Secondary | ICD-10-CM | POA: Diagnosis not present

## 2018-10-11 DIAGNOSIS — I10 Essential (primary) hypertension: Secondary | ICD-10-CM | POA: Diagnosis not present

## 2018-10-11 DIAGNOSIS — J449 Chronic obstructive pulmonary disease, unspecified: Secondary | ICD-10-CM | POA: Diagnosis not present

## 2018-10-11 DIAGNOSIS — B20 Human immunodeficiency virus [HIV] disease: Secondary | ICD-10-CM | POA: Diagnosis not present

## 2018-10-12 DIAGNOSIS — I10 Essential (primary) hypertension: Secondary | ICD-10-CM | POA: Diagnosis not present

## 2018-10-12 DIAGNOSIS — J449 Chronic obstructive pulmonary disease, unspecified: Secondary | ICD-10-CM | POA: Diagnosis not present

## 2018-10-12 DIAGNOSIS — B20 Human immunodeficiency virus [HIV] disease: Secondary | ICD-10-CM | POA: Diagnosis not present

## 2018-10-13 DIAGNOSIS — I10 Essential (primary) hypertension: Secondary | ICD-10-CM | POA: Diagnosis not present

## 2018-10-13 DIAGNOSIS — J449 Chronic obstructive pulmonary disease, unspecified: Secondary | ICD-10-CM | POA: Diagnosis not present

## 2018-10-13 DIAGNOSIS — B20 Human immunodeficiency virus [HIV] disease: Secondary | ICD-10-CM | POA: Diagnosis not present

## 2018-10-14 ENCOUNTER — Encounter: Payer: Self-pay | Admitting: Gastroenterology

## 2018-10-14 DIAGNOSIS — J449 Chronic obstructive pulmonary disease, unspecified: Secondary | ICD-10-CM | POA: Diagnosis not present

## 2018-10-14 DIAGNOSIS — B20 Human immunodeficiency virus [HIV] disease: Secondary | ICD-10-CM | POA: Diagnosis not present

## 2018-10-14 DIAGNOSIS — I10 Essential (primary) hypertension: Secondary | ICD-10-CM | POA: Diagnosis not present

## 2018-10-14 MED FILL — BIKTARVY 50-200-25 MG TABS: 50-200-25 | 30 days supply | Qty: 30 | Fill #1

## 2018-10-15 DIAGNOSIS — B20 Human immunodeficiency virus [HIV] disease: Secondary | ICD-10-CM | POA: Diagnosis not present

## 2018-10-15 DIAGNOSIS — J449 Chronic obstructive pulmonary disease, unspecified: Secondary | ICD-10-CM | POA: Diagnosis not present

## 2018-10-15 DIAGNOSIS — I10 Essential (primary) hypertension: Secondary | ICD-10-CM | POA: Diagnosis not present

## 2018-10-16 DIAGNOSIS — B20 Human immunodeficiency virus [HIV] disease: Secondary | ICD-10-CM | POA: Diagnosis not present

## 2018-10-16 DIAGNOSIS — I10 Essential (primary) hypertension: Secondary | ICD-10-CM | POA: Diagnosis not present

## 2018-10-16 DIAGNOSIS — J449 Chronic obstructive pulmonary disease, unspecified: Secondary | ICD-10-CM | POA: Diagnosis not present

## 2018-10-16 NOTE — Telephone Encounter (Signed)
PA from 10/22 was denied.  Resubmitted today 10/30 to [place] [facility] via phone.  TU#[id number] Reference #[id number]

## 2018-10-17 DIAGNOSIS — B20 Human immunodeficiency virus [HIV] disease: Secondary | ICD-10-CM | POA: Diagnosis not present

## 2018-10-17 DIAGNOSIS — J449 Chronic obstructive pulmonary disease, unspecified: Secondary | ICD-10-CM | POA: Diagnosis not present

## 2018-10-17 DIAGNOSIS — I10 Essential (primary) hypertension: Secondary | ICD-10-CM | POA: Diagnosis not present

## 2018-10-17 NOTE — Telephone Encounter (Signed)
PA for Dronabinol denied for Janice Williams that was submitted yesterday.  PA appealed and resubmitted today 10/17/2018  WN#[id number] Ref # [id number]  Needs 24 hour and answer will be given. Pricilla Riffle RN

## 2018-10-18 DIAGNOSIS — B20 Human immunodeficiency virus [HIV] disease: Secondary | ICD-10-CM | POA: Diagnosis not present

## 2018-10-18 DIAGNOSIS — I10 Essential (primary) hypertension: Secondary | ICD-10-CM | POA: Diagnosis not present

## 2018-10-18 DIAGNOSIS — J449 Chronic obstructive pulmonary disease, unspecified: Secondary | ICD-10-CM | POA: Diagnosis not present

## 2018-10-18 NOTE — Telephone Encounter (Signed)
Called [place] [facility] Marinol PA approved 10/17/2018-10/12/2019.  Called [place], [address] to update them.  Called [place] and informed her as well.  Patient was appreciative and verbalized understanding. Pricilla Riffle RN

## 2018-10-19 DIAGNOSIS — B20 Human immunodeficiency virus [HIV] disease: Secondary | ICD-10-CM | POA: Diagnosis not present

## 2018-10-19 DIAGNOSIS — I10 Essential (primary) hypertension: Secondary | ICD-10-CM | POA: Diagnosis not present

## 2018-10-19 DIAGNOSIS — J449 Chronic obstructive pulmonary disease, unspecified: Secondary | ICD-10-CM | POA: Diagnosis not present

## 2018-10-20 DIAGNOSIS — J449 Chronic obstructive pulmonary disease, unspecified: Secondary | ICD-10-CM | POA: Diagnosis not present

## 2018-10-20 DIAGNOSIS — B20 Human immunodeficiency virus [HIV] disease: Secondary | ICD-10-CM | POA: Diagnosis not present

## 2018-10-20 DIAGNOSIS — I10 Essential (primary) hypertension: Secondary | ICD-10-CM | POA: Diagnosis not present

## 2018-10-21 ENCOUNTER — Ambulatory Visit: Payer: Medicaid Other | Admitting: Internal Medicine

## 2018-10-21 DIAGNOSIS — B20 Human immunodeficiency virus [HIV] disease: Secondary | ICD-10-CM | POA: Diagnosis not present

## 2018-10-21 DIAGNOSIS — J449 Chronic obstructive pulmonary disease, unspecified: Secondary | ICD-10-CM | POA: Diagnosis not present

## 2018-10-21 DIAGNOSIS — I10 Essential (primary) hypertension: Secondary | ICD-10-CM | POA: Diagnosis not present

## 2018-10-22 DIAGNOSIS — J449 Chronic obstructive pulmonary disease, unspecified: Secondary | ICD-10-CM | POA: Diagnosis not present

## 2018-10-22 DIAGNOSIS — B20 Human immunodeficiency virus [HIV] disease: Secondary | ICD-10-CM | POA: Diagnosis not present

## 2018-10-22 DIAGNOSIS — I10 Essential (primary) hypertension: Secondary | ICD-10-CM | POA: Diagnosis not present

## 2018-10-23 ENCOUNTER — Other Ambulatory Visit: Payer: Self-pay | Admitting: Internal Medicine

## 2018-10-23 DIAGNOSIS — J449 Chronic obstructive pulmonary disease, unspecified: Secondary | ICD-10-CM | POA: Diagnosis not present

## 2018-10-23 DIAGNOSIS — B20 Human immunodeficiency virus [HIV] disease: Secondary | ICD-10-CM | POA: Diagnosis not present

## 2018-10-23 DIAGNOSIS — I10 Essential (primary) hypertension: Secondary | ICD-10-CM | POA: Diagnosis not present

## 2018-10-24 DIAGNOSIS — I10 Essential (primary) hypertension: Secondary | ICD-10-CM | POA: Diagnosis not present

## 2018-10-24 DIAGNOSIS — B20 Human immunodeficiency virus [HIV] disease: Secondary | ICD-10-CM | POA: Diagnosis not present

## 2018-10-24 DIAGNOSIS — J449 Chronic obstructive pulmonary disease, unspecified: Secondary | ICD-10-CM | POA: Diagnosis not present

## 2018-10-25 DIAGNOSIS — J449 Chronic obstructive pulmonary disease, unspecified: Secondary | ICD-10-CM | POA: Diagnosis not present

## 2018-10-25 DIAGNOSIS — B20 Human immunodeficiency virus [HIV] disease: Secondary | ICD-10-CM | POA: Diagnosis not present

## 2018-10-25 DIAGNOSIS — I10 Essential (primary) hypertension: Secondary | ICD-10-CM | POA: Diagnosis not present

## 2018-10-26 DIAGNOSIS — B20 Human immunodeficiency virus [HIV] disease: Secondary | ICD-10-CM | POA: Diagnosis not present

## 2018-10-26 DIAGNOSIS — I10 Essential (primary) hypertension: Secondary | ICD-10-CM | POA: Diagnosis not present

## 2018-10-26 DIAGNOSIS — J449 Chronic obstructive pulmonary disease, unspecified: Secondary | ICD-10-CM | POA: Diagnosis not present

## 2018-10-27 DIAGNOSIS — I10 Essential (primary) hypertension: Secondary | ICD-10-CM | POA: Diagnosis not present

## 2018-10-27 DIAGNOSIS — J449 Chronic obstructive pulmonary disease, unspecified: Secondary | ICD-10-CM | POA: Diagnosis not present

## 2018-10-27 DIAGNOSIS — B20 Human immunodeficiency virus [HIV] disease: Secondary | ICD-10-CM | POA: Diagnosis not present

## 2018-10-28 DIAGNOSIS — I10 Essential (primary) hypertension: Secondary | ICD-10-CM | POA: Diagnosis not present

## 2018-10-28 DIAGNOSIS — J449 Chronic obstructive pulmonary disease, unspecified: Secondary | ICD-10-CM | POA: Diagnosis not present

## 2018-10-28 DIAGNOSIS — B20 Human immunodeficiency virus [HIV] disease: Secondary | ICD-10-CM | POA: Diagnosis not present

## 2018-10-28 MED FILL — FLUTICASONE PROP 50 MCG SPR: 50 | 30 days supply | Qty: 16 | Fill #1

## 2018-10-29 ENCOUNTER — Encounter: Payer: Self-pay | Admitting: Internal Medicine

## 2018-10-29 DIAGNOSIS — B20 Human immunodeficiency virus [HIV] disease: Secondary | ICD-10-CM | POA: Diagnosis not present

## 2018-10-29 DIAGNOSIS — I10 Essential (primary) hypertension: Secondary | ICD-10-CM | POA: Diagnosis not present

## 2018-10-29 DIAGNOSIS — J449 Chronic obstructive pulmonary disease, unspecified: Secondary | ICD-10-CM | POA: Diagnosis not present

## 2018-10-30 ENCOUNTER — Encounter (HOSPITAL_COMMUNITY): Payer: Self-pay | Admitting: Emergency Medicine

## 2018-10-30 ENCOUNTER — Ambulatory Visit (INDEPENDENT_AMBULATORY_CARE_PROVIDER_SITE_OTHER): Payer: Medicaid Other

## 2018-10-30 ENCOUNTER — Ambulatory Visit (HOSPITAL_COMMUNITY)
Admission: EM | Admit: 2018-10-30 | Discharge: 2018-10-30 | Disposition: A | Discharge status: Home / Self Care | Payer: Medicaid Other | Attending: Family Medicine | Admitting: Family Medicine

## 2018-10-30 DIAGNOSIS — B20 Human immunodeficiency virus [HIV] disease: Secondary | ICD-10-CM | POA: Diagnosis not present

## 2018-10-30 DIAGNOSIS — I10 Essential (primary) hypertension: Secondary | ICD-10-CM | POA: Diagnosis not present

## 2018-10-30 DIAGNOSIS — J441 Chronic obstructive pulmonary disease with (acute) exacerbation: Secondary | ICD-10-CM | POA: Diagnosis not present

## 2018-10-30 DIAGNOSIS — J449 Chronic obstructive pulmonary disease, unspecified: Secondary | ICD-10-CM | POA: Diagnosis not present

## 2018-10-30 DIAGNOSIS — R05 Cough: Secondary | ICD-10-CM | POA: Diagnosis not present

## 2018-10-30 IMAGING — DX DG CHEST 2V
2 series · 2 of 2 positions shown · non-contrast
Comparison: [DATE]

CLINICAL DATA: Cough with green sputum for 2 days

EXAM:
CHEST - 2 VIEW

[chest pa]
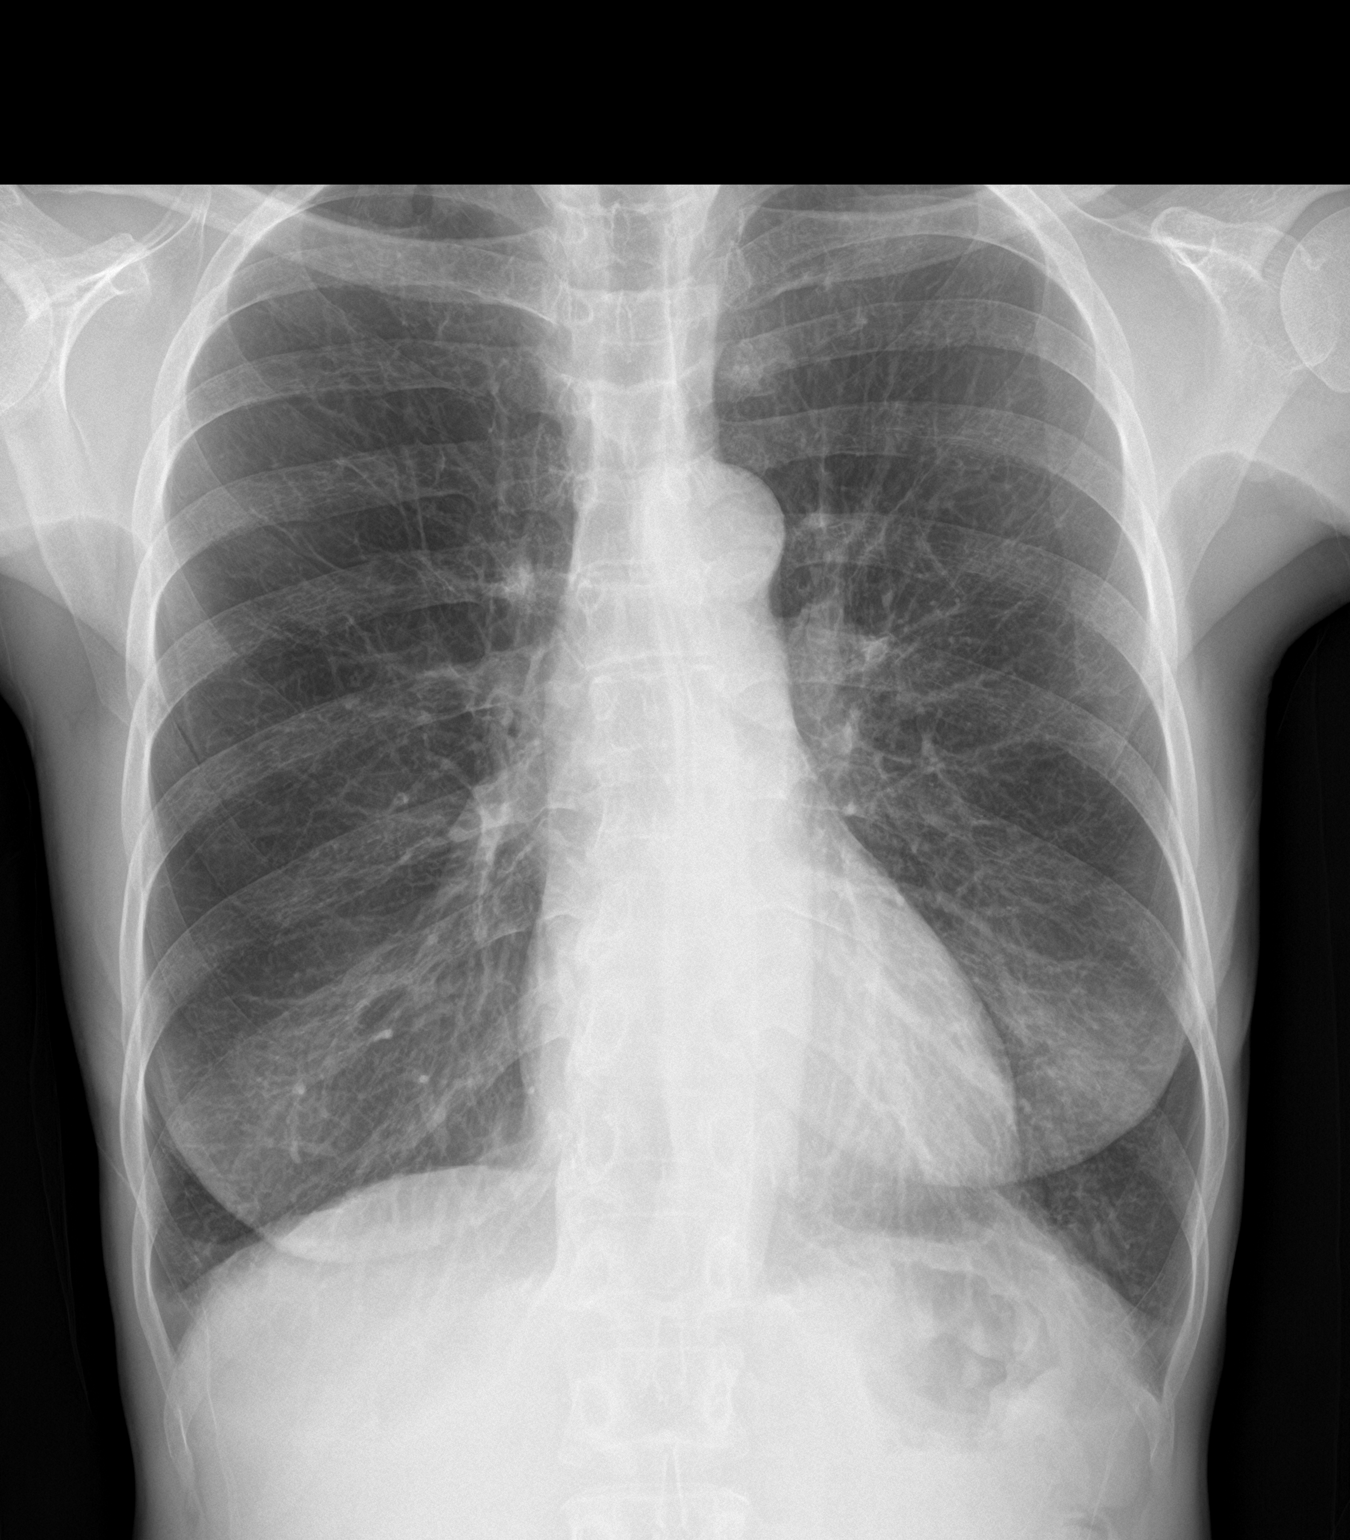

[chest lat]
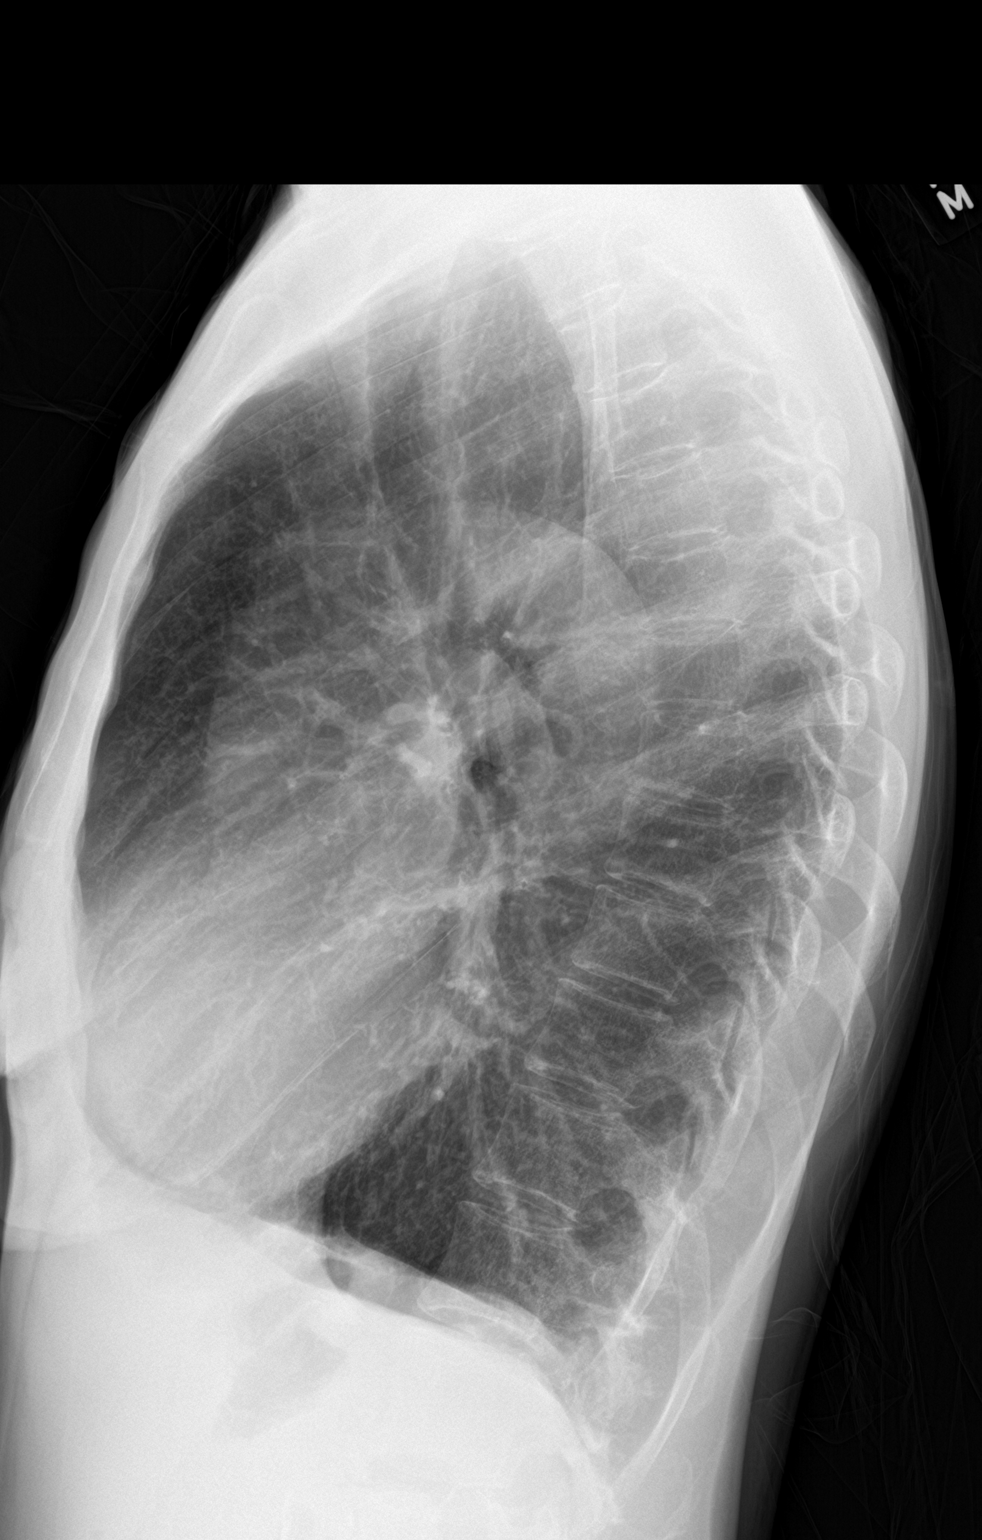

[2 of 2 positions shown; findings below may reference images not displayed]

FINDINGS: The lungs are hyperinflated likely secondary to COPD. There is no
focal parenchymal opacity. There is no pleural effusion or
pneumothorax. The heart and mediastinal contours are unremarkable.

The osseous structures are unremarkable.
IMPRESSION: No active cardiopulmonary disease.

## 2018-10-30 MED ORDER — ALBUTEROL SULFATE HFA 108 (90 BASE) MCG/ACT IN AERS
2.0000 | INHALATION_SPRAY | Freq: Once | RESPIRATORY_TRACT | Status: AC
  Administered 2018-10-30: 2 via RESPIRATORY_TRACT

## 2018-10-30 MED ORDER — ALBUTEROL SULFATE HFA 108 (90 BASE) MCG/ACT IN AERS
INHALATION_SPRAY | RESPIRATORY_TRACT | Status: AC
  Filled 2018-10-30: qty 6.7

## 2018-10-30 MED ORDER — AZITHROMYCIN 250 MG PO TABS: 250.0000 mg | ORAL_TABLET | Freq: Every day | ORAL | 0 refills | Status: DC

## 2018-10-30 MED ORDER — PREDNISONE 20 MG PO TABS: 20.0000 mg | ORAL_TABLET | Freq: Two times a day (BID) | ORAL | 0 refills | Status: AC

## 2018-10-30 NOTE — ED Triage Notes (Signed)
Pt c/o coughing up green stuff, c/o hx of bronchitis, sinus issues. C/o facial pain.

## 2018-10-30 NOTE — Discharge Instructions (Signed)
Chest x-ray did not show pneumonia.  Symptoms most likely COPD exacerbation Get plenty of rest and push fluids Inhaler given in office.  Use inhaler as needed for shortness of breath or wheezing.   Azithromycin prescribed.  Take antibiotic as directed and to completion Prescribed prednisolone.  Take as directed and to completion Follow up with PCP for recheck and/or if symptoms persists Return or go to ER if you have any new or worsening symptoms such as fever, chills, fatigue, shortness of breath, wheezing, chest pain, nausea, changes in bowel or bladder habits, etc..[name]

## 2018-10-30 NOTE — ED Provider Notes (Signed)
[facility]   [id number] 10/30/18 Arrival Time: 9937  Cc: COUGH  SUBJECTIVE:  Janice Williams is a 57 y.o. female hx significant for HIV and COPD, who presents with cough x 2 days.  Denies positive sick exposure or precipitating event.  Describes cough as constant and productive with green sputum.  Has tried OTC medications without relief.  Symptoms are made worse at night.  Reports previous symptoms in the past and diagnosed with bronchitis, was hospitalized at that time. Complains of congestion, rhinorrhea, sinus pressure, wheezing and SOB.  Denies fever, chills, fatigue, sore throat, chest pain, nausea, changes in bowel or bladder habits.    ROS: As per HPI.  Past Medical History:  Diagnosis Date  . Arthritis   . HIV infection ([place])   . Hypertension   . Stroke ([place])   . Substance abuse ([place])    history, clean 7 years   History reviewed. No pertinent surgical history. Allergies  Allergen Reactions  . Chantix [Varenicline] Itching   No current facility-administered medications on file prior to encounter.    Current Outpatient Medications on File Prior to Encounter  Medication Sig Dispense Refill  . albuterol (PROVENTIL HFA;VENTOLIN HFA) 108 (90 Base) MCG/ACT inhaler Inhale 2 puffs into the lungs every 6 (six) hours as needed for wheezing or shortness of breath. 1 Inhaler 5  . alendronate (FOSAMAX) 70 MG tablet TAKE 1 TABLET BY MOUTH ONCE A WEEK. TAKE WITH A FULL GLASS OF WATER ON EMPTY STOMACH 12 tablet 1  . amLODipine (NORVASC) 10 MG tablet TAKE ONE TABLET BY MOUTH DAILY 30 tablet 0  . BIKTARVY 50-200-25 MG TABS tablet TAKE 1 TABLET BY MOUTH DAILY. 30 tablet 6  . calcium-vitamin D (OSCAL WITH D) 500-200 MG-UNIT tablet Take 1 tablet by mouth 2 (two) times daily. 100 tablet 11  . dronabinol (MARINOL) 5 MG capsule Take 1 capsule (5 mg total) by mouth 2 (two) times daily before a meal. 60 capsule 3  . ENSURE (ENSURE) Take 237 mLs by mouth 3 (three) times daily between  meals. 237 mL 5  . fluticasone (FLONASE) 50 MCG/ACT nasal spray INSTILL 1 SPRAY INTO BOTH NOSTRILS DAILY 16 g 2  . hydrocortisone (ANUSOL-HC) 2.5 % rectal cream Apply rectally 2 times daily 28.35 g 0  . loratadine (CLARITIN) 10 MG tablet Take 1 tablet (10 mg total) by mouth daily. 30 tablet 0  . omeprazole (PRILOSEC) 20 MG capsule TAKE 1 CAPSULE BY MOUTH EVERY DAY 30 capsule 5  . ondansetron (ZOFRAN) 4 MG tablet Take 1 tablet (4 mg total) by mouth every 8 (eight) hours as needed for nausea or vomiting. 20 tablet 0  . polyethylene glycol (MIRALAX / GLYCOLAX) packet Take one capful in 8 oz of fluid of your choice twice a day until having soft stools, then back down to once a day.  If not having soft stools with twice a day, increased to three times a day (Patient taking differently: Take 17 g by mouth 2 (two) times daily as needed for mild constipation. ) 100 each 0  . pramoxine (PROCTOFOAM) 1 % foam Place 1 application rectally 3 (three) times daily as needed. 15 g 3  . pravastatin (PRAVACHOL) 20 MG tablet TAKE 1 TABLET BY MOUTH EVERY DAY 30 tablet 5  . sodium chloride (OCEAN) 0.65 % SOLN nasal spray Place 2 sprays into both nostrils 3 (three) times daily. 1 Bottle 0  . traMADol (ULTRAM) 50 MG tablet Take 1 tablet (50 mg total) by mouth  every 12 (twelve) hours as needed. 60 tablet 0  . Vitamin D, Ergocalciferol, (DRISDOL) 50000 units CAPS capsule TAKE 1 CAPSULE BY MOUTH EVERY 7 DAYS 12 capsule 0  . Witch Hazel (TUCKS) 50 % PADS Use as needed for rectal itching, after having BM 40 each 0    Social History   Socioeconomic History  . Marital status: Single    Spouse name: Not on file  . Number of children: Not on file  . Years of education: Not on file  . Highest education level: Not on file  Occupational History  . Not on file  Social Needs  . Financial resource strain: Not on file  . Food insecurity:    Worry: Not on file    Inability: Not on file  . Transportation needs:    Medical: Not  on file    Non-medical: Not on file  Tobacco Use  . Smoking status: Former Smoker    Packs/day: 0.40    Types: Cigarettes    Start date: 12/18/2012  . Smokeless tobacco: Never Used  Substance and Sexual Activity  . Alcohol use: Yes    Alcohol/week: 0.0 standard drinks    Comment: "occasional"   . Drug use: No  . Sexual activity: Not Currently    Partners: Male    Comment: pt. given condoms  Lifestyle  . Physical activity:    Days per week: Not on file    Minutes per session: Not on file  . Stress: Not on file  Relationships  . Social connections:    Talks on phone: Not on file    Gets together: Not on file    Attends religious service: Not on file    Active member of club or organization: Not on file    Attends meetings of clubs or organizations: Not on file    Relationship status: Not on file  . Intimate partner violence:    Fear of current or ex partner: Not on file    Emotionally abused: Not on file    Physically abused: Not on file    Forced sexual activity: Not on file  Other Topics Concern  . Not on file  Social History Narrative  . Not on file   Family History  Problem Relation Age of Onset  . Hypertension Mother   . Hypertension Father   . Hyperlipidemia Father      OBJECTIVE:  Vitals:   10/30/18 1211  BP: 128/89  Pulse: 86  Resp: 16  Temp: 98.4 F (36.9 C)  SpO2: 97%     General appearance: Alert, appears fatigued, but nontoxic; speaking in full sentences without difficulty HEENT:NCAT; Ears: EACs clear, TMs pearly gray; Eyes: PERRL.  EOM grossly intact. Nose: nares patent without rhinorrhea; Throat: tonsils nonerythematous or enlarged, uvula midline  Neck: supple without LAD Lungs: clear to auscultation bilaterally without adventitious breath sounds; hyperresonant lung sounds; normal respiratory effort; moderate wet cough present Heart: regular rate and rhythm.  Radial pulses 2+ symmetrical bilaterally Skin: warm and dry Psychological: alert and  cooperative; normal mood and affect  DIAGNOSTIC STUDIES:  Dg Chest 2 View  Result Date: 10/30/2018 CLINICAL DATA:  Cough with green sputum for 2 days EXAM: CHEST - 2 VIEW COMPARISON:  06/08/2017 FINDINGS: The lungs are hyperinflated likely secondary to COPD. There is no focal parenchymal opacity. There is no pleural effusion or pneumothorax. The heart and mediastinal contours are unremarkable. The osseous structures are unremarkable. IMPRESSION: No active cardiopulmonary disease. Electronically Signed  By: Kathreen Devoid   On: 10/30/2018 13:05     ASSESSMENT & PLAN:  1. COPD with acute exacerbation ([place])     Meds ordered this encounter  Medications  . predniSONE (DELTASONE) 20 MG tablet    Sig: Take 1 tablet (20 mg total) by mouth 2 (two) times daily with a meal for 5 days.    Dispense:  10 tablet    Refill:  0    Order Specific Question:   Supervising Provider    Answer:   Wynona Luna [phone]  . azithromycin (ZITHROMAX) 250 MG tablet    Sig: Take 1 tablet (250 mg total) by mouth daily. Take first 2 tablets together, then 1 every day until finished.    Dispense:  6 tablet    Refill:  0    Order Specific Question:   Supervising Provider    Answer:   Wynona Luna [phone]  . albuterol (PROVENTIL HFA;VENTOLIN HFA) 108 (90 Base) MCG/ACT inhaler 2 puff    Orders Placed This Encounter  Procedures  . DG Chest 2 View    Standing Status:   Standing    Number of Occurrences:   1    Order Specific Question:   Reason for Exam (SYMPTOM  OR DIAGNOSIS REQUIRED)    Answer:   Cough with green sputum x 2 days    Chest x-ray did not show pneumonia.  Symptoms most likely COPD exacerbation Get plenty of rest and push fluids Inhaler given in office.  Use inhaler as needed for shortness of breath or wheezing.   Azithromycin prescribed.  Take antibiotic as directed and to completion Prescribed prednisolone.  Take as directed and to completion Follow up with PCP for recheck  and/or if symptoms persists Return or go to ER if you have any new or worsening symptoms such as fever, chills, fatigue, shortness of breath, wheezing, chest pain, nausea, changes in bowel or bladder habits, etc...  Reviewed expectations re: course of current medical issues. Questions answered. Outlined signs and symptoms indicating need for more acute intervention. Patient verbalized understanding. After Visit Summary given.          Lestine Box, PA-C 10/30/18 1318

## 2018-10-31 DIAGNOSIS — B20 Human immunodeficiency virus [HIV] disease: Secondary | ICD-10-CM | POA: Diagnosis not present

## 2018-10-31 DIAGNOSIS — J449 Chronic obstructive pulmonary disease, unspecified: Secondary | ICD-10-CM | POA: Diagnosis not present

## 2018-10-31 DIAGNOSIS — I10 Essential (primary) hypertension: Secondary | ICD-10-CM | POA: Diagnosis not present

## 2018-11-01 DIAGNOSIS — I10 Essential (primary) hypertension: Secondary | ICD-10-CM | POA: Diagnosis not present

## 2018-11-01 DIAGNOSIS — B20 Human immunodeficiency virus [HIV] disease: Secondary | ICD-10-CM | POA: Diagnosis not present

## 2018-11-01 DIAGNOSIS — J449 Chronic obstructive pulmonary disease, unspecified: Secondary | ICD-10-CM | POA: Diagnosis not present

## 2018-11-02 DIAGNOSIS — J449 Chronic obstructive pulmonary disease, unspecified: Secondary | ICD-10-CM | POA: Diagnosis not present

## 2018-11-02 DIAGNOSIS — I10 Essential (primary) hypertension: Secondary | ICD-10-CM | POA: Diagnosis not present

## 2018-11-02 DIAGNOSIS — B20 Human immunodeficiency virus [HIV] disease: Secondary | ICD-10-CM | POA: Diagnosis not present

## 2018-11-03 DIAGNOSIS — I10 Essential (primary) hypertension: Secondary | ICD-10-CM | POA: Diagnosis not present

## 2018-11-03 DIAGNOSIS — B20 Human immunodeficiency virus [HIV] disease: Secondary | ICD-10-CM | POA: Diagnosis not present

## 2018-11-03 DIAGNOSIS — J449 Chronic obstructive pulmonary disease, unspecified: Secondary | ICD-10-CM | POA: Diagnosis not present

## 2018-11-04 ENCOUNTER — Ambulatory Visit (INDEPENDENT_AMBULATORY_CARE_PROVIDER_SITE_OTHER): Payer: Medicaid Other | Admitting: Infectious Diseases

## 2018-11-04 ENCOUNTER — Encounter: Payer: Self-pay | Admitting: Infectious Diseases

## 2018-11-04 VITALS — BP 134/91 | HR 68 | Temp 98.2°F | Wt 99.1 lb

## 2018-11-04 DIAGNOSIS — J0110 Acute frontal sinusitis, unspecified: Secondary | ICD-10-CM | POA: Diagnosis not present

## 2018-11-04 DIAGNOSIS — J449 Chronic obstructive pulmonary disease, unspecified: Secondary | ICD-10-CM | POA: Diagnosis not present

## 2018-11-04 DIAGNOSIS — I10 Essential (primary) hypertension: Secondary | ICD-10-CM | POA: Diagnosis not present

## 2018-11-04 DIAGNOSIS — B20 Human immunodeficiency virus [HIV] disease: Secondary | ICD-10-CM | POA: Diagnosis not present

## 2018-11-04 MED ORDER — AMOXICILLIN-POT CLAVULANATE 875-125 MG PO TABS: 1.0000 | ORAL_TABLET | Freq: Two times a day (BID) | ORAL | 0 refills | Status: DC

## 2018-11-04 NOTE — Progress Notes (Signed)
Name: Janice Williams  DOB: June 05, 1961 MRN: [id number] PCP: Medicine, [facility]    Patient Active Problem List   Diagnosis Date Noted  . Moderate protein-calorie malnutrition ([place]) 05/31/2018  . COPD with acute exacerbation ([place]) 06/09/2017  . Hypertension 06/08/2017  . Dental abscess 03/15/2015  . Knee pain, bilateral 03/15/2015  . Acne cystica 12/22/2011  . Dyslipidemia 07/06/2011  . Routine health maintenance 07/06/2011  . EPIDERMOID CYST 02/10/2011  . ACUTE SINUSITIS, UNSPECIFIED 01/20/2010  . EXTERNAL OTITIS 01/06/2010  . Hyperlipidemia 06/03/2009  . WEIGHT LOSS 06/03/2009  . CARBUNCLE AND FURUNCLE OF FACE 02/04/2009  . FACIAL RASH 02/04/2009  . Human immunodeficiency virus (HIV) disease ([place]) 10/29/2008  . ANEMIA-NOS 10/29/2008  . PERIPHERAL NEUROPATHY 10/29/2008  . GERD 10/29/2008  . RENAL INSUFFICIENCY 10/29/2008  . CEREBROVASCULAR ACCIDENT, HX OF 10/29/2008  . TOBACCO DEPENDENCE 02/14/2007  . CVA 02/14/2007  . RHINITIS, ALLERGIC 02/14/2007  . DYSPEPSIA 02/14/2007  . ACNE 02/14/2007  . WEIGHT LOSS, ABNORMAL 02/14/2007     Subjective:  Janice Williams is here today for walk in appointment for concerns over sinus infection.   She had a URI/bronchitis that she was treated for a week ago with urgent care (given prednisone) and now over the last 4 days she has developed worsening and severe symptoms including frontal sinus pain, headaches, eye pain and fatigue. She has no fevers/chills. These worsen when she bends over. She has been using OTC sinusex spray that has helped with her symptoms of congestion and allows her to breathe better but still has severe pain in her face. She tells me her chest cold has improved following prednisone. She is happy she has regained 6 lbs since starting marinol.   Review of Systems  Constitutional: Positive for malaise/fatigue. Negative for chills and fever.  HENT: Positive for congestion and sinus pain. Negative for ear  pain and sore throat.   Eyes: Negative for blurred vision.  Respiratory: Negative for cough, shortness of breath and wheezing.   Cardiovascular: Negative for chest pain and leg swelling.  Gastrointestinal: Negative for diarrhea.  Neurological: Positive for dizziness and headaches.    Past Medical History:  Diagnosis Date  . Arthritis   . HIV infection ([place])   . Hypertension   . Stroke ([place])   . 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 albuterol (PROVENTIL HFA;VENTOLIN HFA) 108 (90 Base) MCG/ACT inhaler Inhale 2 puffs into the lungs every 6 (six) hours as needed for wheezing or shortness of breath. 1 Inhaler 5  . alendronate (FOSAMAX) 70 MG tablet TAKE 1 TABLET BY MOUTH ONCE A WEEK. TAKE WITH A FULL GLASS OF WATER ON EMPTY STOMACH 12 tablet 1  . amLODipine (NORVASC) 10 MG tablet TAKE ONE TABLET BY MOUTH DAILY 30 tablet 0  . BIKTARVY 50-200-25 MG TABS tablet TAKE 1 TABLET BY MOUTH DAILY. 30 tablet 6  . dronabinol (MARINOL) 5 MG capsule Take 1 capsule (5 mg total) by mouth 2 (two) times daily before a meal. 60 capsule 3  . ENSURE (ENSURE) Take 237 mLs by mouth 3 (three) times daily between meals. 237 mL 5  . fluticasone (FLONASE) 50 MCG/ACT nasal spray INSTILL 1 SPRAY INTO BOTH NOSTRILS DAILY 16 g 2  . hydrocortisone (ANUSOL-HC) 2.5 % rectal cream Apply rectally 2 times daily 28.35 g 0  . omeprazole (PRILOSEC) 20 MG capsule TAKE 1 CAPSULE BY MOUTH  EVERY DAY 30 capsule 5  . polyethylene glycol (MIRALAX / GLYCOLAX) packet Take one capful in 8 oz of fluid of your choice twice a day until having soft stools, then back down to once a day.  If not having soft stools with twice a day, increased to three times a day (Patient taking differently: Take 17 g by mouth 2 (two) times daily as needed for mild constipation. ) 100 each 0  . pramoxine (PROCTOFOAM) 1 % foam Place 1 application rectally 3 (three) times daily as  needed. 15 g 3  . pravastatin (PRAVACHOL) 20 MG tablet TAKE 1 TABLET BY MOUTH EVERY DAY 30 tablet 5  . sodium chloride (OCEAN) 0.65 % SOLN nasal spray Place 2 sprays into both nostrils 3 (three) times daily. 1 Bottle 0  . traMADol (ULTRAM) 50 MG tablet Take 1 tablet (50 mg total) by mouth every 12 (twelve) hours as needed. 60 tablet 0  . Vitamin D, Ergocalciferol, (DRISDOL) 50000 units CAPS capsule TAKE 1 CAPSULE BY MOUTH EVERY 7 DAYS 12 capsule 0  . Witch Hazel (TUCKS) 50 % PADS Use as needed for rectal itching, after having BM 40 each 0  . azithromycin (ZITHROMAX) 250 MG tablet Take 1 tablet (250 mg total) by mouth daily. Take first 2 tablets together, then 1 every day until finished. (Patient not taking: Reported on 11/04/2018) 6 tablet 0  . calcium-vitamin D (OSCAL WITH D) 500-200 MG-UNIT tablet Take 1 tablet by mouth 2 (two) times daily. (Patient not taking: Reported on 11/04/2018) 100 tablet 11  . loratadine (CLARITIN) 10 MG tablet Take 1 tablet (10 mg total) by mouth daily. (Patient not taking: Reported on 11/04/2018) 30 tablet 0  . ondansetron (ZOFRAN) 4 MG tablet Take 1 tablet (4 mg total) by mouth every 8 (eight) hours as needed for nausea or vomiting. (Patient not taking: Reported on 11/04/2018) 20 tablet 0  . predniSONE (DELTASONE) 20 MG tablet Take 1 tablet (20 mg total) by mouth 2 (two) times daily with a meal for 5 days. (Patient not taking: Reported on 11/04/2018) 10 tablet 0   No facility-administered medications prior to visit.      Allergies  Allergen Reactions  . Chantix [Varenicline] Itching    Social History   Tobacco Use  . Smoking status: Former Smoker    Packs/day: 0.40    Types: Cigarettes    Start date: 12/18/2012  . Smokeless tobacco: Never Used  Substance Use Topics  . Alcohol use: Yes    Alcohol/week: 0.0 standard drinks    Comment: "occasional"   . Drug use: No    Family History  Problem Relation Age of Onset  . Hypertension Mother   . Hypertension  Father   . Hyperlipidemia Father     Social History   Substance and Sexual Activity  Sexual Activity Not Currently  . Partners: Male   Comment: pt. given condoms     Objective:   Vitals:   11/04/18 1031  BP: (!) 134/91  Pulse: 68  Temp: 98.2 F (36.8 C)  TempSrc: Oral  SpO2: 100%  Weight: 99 lb 1.9 oz (45 kg)   Body mass index is 17.56 kg/m.  Physical Exam  Constitutional: She appears well-developed. No distress.  Appears ill. Holding head to the side d/t pain.   HENT:  Right Ear: Tympanic membrane normal. No middle ear effusion.  Left Ear: Tympanic membrane normal.  No middle ear effusion.  Nose: Mucosal edema present. Right sinus exhibits frontal sinus tenderness. Left  sinus exhibits frontal sinus tenderness.  Mouth/Throat: Oropharynx is clear and moist. No oropharyngeal exudate.  Eyes: Pupils are equal, round, and reactive to light.  Cardiovascular: Normal rate.  Pulmonary/Chest: Effort normal and breath sounds normal.  Vitals reviewed.   Lab Results  HIV 1 RNA Quant (copies/mL)  Date Value  06/07/2018 62 (H)  02/01/2018 34 (H)  11/20/2017 <20 DETECTED (A)   CD4 T Cell Abs (/uL)  Date Value  06/07/2018 810  02/01/2018 830  11/20/2017 650     Assessment & Plan:   Problem List Items Addressed This Visit      Unprioritized   ACUTE SINUSITIS, UNSPECIFIED - Primary    Suspect bacterial component considering severe onset of symptoms and having just had URI viral cold that she initially recovered from. She does have hx of allergic mediated sinusitis as well. Will give Augmentin x 10d. Symptomatic care discussed. Advised to use flonase as well following symptom resolution. OK to continue Sinex spray for now short term.       Relevant Medications   amoxicillin-clavulanate (AUGMENTIN) 875-125 MG tablet      [name], MSN, NP-C [facility] Pager: [phone] Office:  [phone]  11/04/18  11:04 AM

## 2018-11-04 NOTE — Patient Instructions (Addendum)
Nice to meet you. Sorry you are feeling badly.   Will give you an antibiotic called augmentin to take twice a day with food for 10 days.    General Recommendations:    Please drink plenty of fluids.  Get plenty of rest   Sleep in humidified air  Use saline nasal sprays  Netti pot   OTC Medications:  Decongestants - helps relieve congestion   Flonase (generic fluticasone) or Nasacort (generic triamcinolone) - please make sure to use the "cross-over" technique at a 45 degree angle towards the opposite eye as opposed to straight up the nasal passageway.   Sudafed (generic pseudoephedrine - Note this is the one that is available behind the pharmacy counter); Products with phenylephrine (-PE) may also be used but is often not as effective as pseudoephedrine.   If you have HIGH BLOOD PRESSURE - Coricidin HBP; AVOID any product that is -D as this contains pseudoephedrine which may increase your blood pressure.  Afrin (oxymetazoline) every 6-8 hours for up to 3 days.   Allergies - helps relieve runny nose, itchy eyes and sneezing   Claritin (generic loratidine), Allegra (fexofenidine), or Zyrtec (generic cyrterizine) for runny nose. These medications should not cause drowsiness.  Note - Benadryl (generic diphenhydramine) may be used however may cause drowsiness  Cough -   Delsym or Robitussin (generic dextromethorphan)  Expectorants - helps loosen mucus to ease removal   Mucinex (generic guaifenesin) as directed on the package.  Headaches / General Aches   Tylenol (generic acetaminophen) - DO NOT EXCEED 3 grams (3,000 mg) in a 24 hour time period  Advil/Motrin (generic ibuprofen)   Sore Throat -   Salt water gargle   Chloraseptic (generic benzocaine) spray or lozenges / Sucrets (generic dyclonine)    Sinusitis Sinusitis is redness, soreness, and inflammation of the paranasal sinuses. Paranasal sinuses are air pockets within the bones of your face (beneath the  eyes, the middle of the forehead, or above the eyes). In healthy paranasal sinuses, mucus is able to drain out, and air is able to circulate through them by way of your nose. However, when your paranasal sinuses are inflamed, mucus and air can become trapped. This can allow bacteria and other germs to grow and cause infection. Sinusitis can develop quickly and last only a short time (acute) or continue over a long period (chronic). Sinusitis that lasts for more than 12 weeks is considered chronic.  CAUSES  Causes of sinusitis include:  Allergies.  Structural abnormalities, such as displacement of the cartilage that separates your nostrils (deviated septum), which can decrease the air flow through your nose and sinuses and affect sinus drainage.  Functional abnormalities, such as when the small hairs (cilia) that line your sinuses and help remove mucus do not work properly or are not present. SIGNS AND SYMPTOMS  Symptoms of acute and chronic sinusitis are the same. The primary symptoms are pain and pressure around the affected sinuses. Other symptoms include:  Upper toothache.  Earache.  Headache.  Bad breath.  Decreased sense of smell and taste.  A cough, which worsens when you are lying flat.  Fatigue.  Fever.  Thick drainage from your nose, which often is green and may contain pus (purulent).  Swelling and warmth over the affected sinuses. DIAGNOSIS  Your health care provider will perform a physical exam. During the exam, your health care provider may:  Look in your nose for signs of abnormal growths in your nostrils (nasal polyps).  Tap over the  affected sinus to check for signs of infection.  View the inside of your sinuses (endoscopy) using an imaging device that has a light attached (endoscope). If your health care provider suspects that you have chronic sinusitis, one or more of the following tests may be recommended:  Allergy tests.  Nasal culture. A sample of  mucus is taken from your nose, sent to a lab, and screened for bacteria.  Nasal cytology. A sample of mucus is taken from your nose and examined by your health care provider to determine if your sinusitis is related to an allergy. TREATMENT  Most cases of acute sinusitis are related to a viral infection and will resolve on their own within 10 days. Sometimes medicines are prescribed to help relieve symptoms (pain medicine, decongestants, nasal steroid sprays, or saline sprays).  However, for sinusitis related to a bacterial infection, your health care provider will prescribe antibiotic medicines. These are medicines that will help kill the bacteria causing the infection.  Rarely, sinusitis is caused by a fungal infection. In theses cases, your health care provider will prescribe antifungal medicine. For some cases of chronic sinusitis, surgery is needed. Generally, these are cases in which sinusitis recurs more than 3 times per year, despite other treatments. HOME CARE INSTRUCTIONS   Drink plenty of water. Water helps thin the mucus so your sinuses can drain more easily.  Use a humidifier.  Inhale steam 3 to 4 times a day (for example, sit in the bathroom with the shower running).  Apply a warm, moist washcloth to your face 3 to 4 times a day, or as directed by your health care provider.  Use saline nasal sprays to help moisten and clean your sinuses.  Take medicines only as directed by your health care provider.  If you were prescribed either an antibiotic or antifungal medicine, finish it all even if you start to feel better. SEEK IMMEDIATE MEDICAL CARE IF:  You have increasing pain or severe headaches.  You have nausea, vomiting, or drowsiness.  You have swelling around your face.  You have vision problems.  You have a stiff neck.  You have difficulty breathing. MAKE SURE YOU:   Understand these instructions.  Will watch your condition.  Will get help right away if you  are not doing well or get worse. Document Released: 12/04/2005 Document Revised: 04/20/2014 Document Reviewed: 12/19/2011 [facility] Patient Information 2015 [place], [place]. This information is not intended to replace advice given to you by your health care provider. Make sure you discuss any questions you have with your health care provider.

## 2018-11-04 NOTE — Assessment & Plan Note (Signed)
Suspect bacterial component considering severe onset of symptoms and having just had URI viral cold that she initially recovered from. She does have hx of allergic mediated sinusitis as well. Will give Augmentin x 10d. Symptomatic care discussed. Advised to use flonase as well following symptom resolution. OK to continue Sinex spray for now short term.

## 2018-11-05 DIAGNOSIS — B20 Human immunodeficiency virus [HIV] disease: Secondary | ICD-10-CM | POA: Diagnosis not present

## 2018-11-05 DIAGNOSIS — I10 Essential (primary) hypertension: Secondary | ICD-10-CM | POA: Diagnosis not present

## 2018-11-05 DIAGNOSIS — J449 Chronic obstructive pulmonary disease, unspecified: Secondary | ICD-10-CM | POA: Diagnosis not present

## 2018-11-06 ENCOUNTER — Other Ambulatory Visit: Payer: Self-pay | Admitting: Behavioral Health

## 2018-11-06 ENCOUNTER — Telehealth: Payer: Self-pay | Admitting: Behavioral Health

## 2018-11-06 ENCOUNTER — Other Ambulatory Visit: Payer: Medicaid Other

## 2018-11-06 DIAGNOSIS — Z113 Encounter for screening for infections with a predominantly sexual mode of transmission: Secondary | ICD-10-CM

## 2018-11-06 DIAGNOSIS — B20 Human immunodeficiency virus [HIV] disease: Secondary | ICD-10-CM | POA: Diagnosis not present

## 2018-11-06 DIAGNOSIS — I10 Essential (primary) hypertension: Secondary | ICD-10-CM | POA: Diagnosis not present

## 2018-11-06 DIAGNOSIS — J449 Chronic obstructive pulmonary disease, unspecified: Secondary | ICD-10-CM | POA: Diagnosis not present

## 2018-11-06 DIAGNOSIS — Z79899 Other long term (current) drug therapy: Secondary | ICD-10-CM | POA: Diagnosis not present

## 2018-11-06 DIAGNOSIS — J0110 Acute frontal sinusitis, unspecified: Secondary | ICD-10-CM

## 2018-11-06 MED ORDER — DOXYCYCLINE HYCLATE 100 MG PO TABS: 100.0000 mg | ORAL_TABLET | Freq: Two times a day (BID) | ORAL | 0 refills | Status: DC

## 2018-11-06 NOTE — Telephone Encounter (Signed)
Can you please send in doxycycline 100 mg BID x 7 days. Take with food please.  Would also start some Flonase BID to help with allergy related congestion as well. As we discussed this may be due to a virus in which the symptoms will take more time to recover but will do so with time. Continue symptomatic treatment.  Thank you.

## 2018-11-06 NOTE — Telephone Encounter (Signed)
Called patient and informed her Janice Williams would like for her to stop the Augmentin and start Doxycycline 100mg  BID with food.  Patient verbalized understanding and medication sent to [facility] on [address].  Patient states she is continuing to use flonase and she even tried a neti pot with some relief.

## 2018-11-06 NOTE — Addendum Note (Signed)
Addended by: Alberteen Sam on: 11/06/2018 04:13 PM   Modules accepted: Orders

## 2018-11-06 NOTE — Telephone Encounter (Signed)
Patient came in today as a walk in.  Patient states she saw Colletta [place] 11/04/2018 for sinusitis.  Patient states she started taking the Augmentin that was prescribed and has experienced diarrhea.  She states she was unable to sleep last night due to having to get up and go to the restroom several times.  Patient states she was taking the medication for two days but now stopped.  Patient is requesting another medication, she states her sinusitis is unchanged.  Patient states she has recently put on 6 pounds and she does not want to lose them. Pricilla Riffle RN

## 2018-11-07 ENCOUNTER — Ambulatory Visit (AMBULATORY_SURGERY_CENTER): Payer: Self-pay | Admitting: *Deleted

## 2018-11-07 ENCOUNTER — Other Ambulatory Visit: Payer: Self-pay

## 2018-11-07 VITALS — Ht 63.0 in | Wt 97.8 lb

## 2018-11-07 DIAGNOSIS — I10 Essential (primary) hypertension: Secondary | ICD-10-CM | POA: Diagnosis not present

## 2018-11-07 DIAGNOSIS — J449 Chronic obstructive pulmonary disease, unspecified: Secondary | ICD-10-CM | POA: Diagnosis not present

## 2018-11-07 DIAGNOSIS — B20 Human immunodeficiency virus [HIV] disease: Secondary | ICD-10-CM | POA: Diagnosis not present

## 2018-11-07 DIAGNOSIS — Z1211 Encounter for screening for malignant neoplasm of colon: Secondary | ICD-10-CM

## 2018-11-07 LAB — T-HELPER CELL (CD4) - (RCID CLINIC ONLY)
CD4 % Helper T Cell: 38 % (ref 33–55)
CD4 T Cell Abs: 910 /uL (ref 400–2700)

## 2018-11-07 MED ORDER — SUPREP BOWEL PREP KIT 17.5-3.13-1.6 GM/177ML PO SOLN: 1.0000 | Freq: Once | ORAL | 0 refills | Status: AC

## 2018-11-07 MED FILL — BIKTARVY 50-200-25 MG TABS: 50-200-25 | 30 days supply | Qty: 30 | Fill #2

## 2018-11-07 NOTE — Progress Notes (Signed)
Patient denies any allergies to egg or soy products. Patient denies complications with anesthesia/sedation.  Patient denies oxygen use at home and denies diet medications. Pamphlet given on colonoscopy. 

## 2018-11-08 DIAGNOSIS — I10 Essential (primary) hypertension: Secondary | ICD-10-CM | POA: Diagnosis not present

## 2018-11-08 DIAGNOSIS — B20 Human immunodeficiency virus [HIV] disease: Secondary | ICD-10-CM | POA: Diagnosis not present

## 2018-11-08 DIAGNOSIS — J449 Chronic obstructive pulmonary disease, unspecified: Secondary | ICD-10-CM | POA: Diagnosis not present

## 2018-11-08 LAB — LIPID PANEL
Cholesterol: 246 mg/dL — ABNORMAL HIGH (ref <200)
HDL: 82 mg/dL (ref >50)
LDL Cholesterol (Calc): 145 mg/dL (calc) — ABNORMAL HIGH
Non-HDL Cholesterol (Calc): 164 mg/dL (calc) — ABNORMAL HIGH (ref <130)
Total CHOL/HDL Ratio: 3 (calc) (ref <5.0)
Triglycerides: 87 mg/dL (ref <150)

## 2018-11-08 LAB — CBC WITH DIFFERENTIAL/PLATELET
Basophils Absolute: 12 cells/uL (ref 0–200)
Basophils Relative: 0.2 %
Eosinophils Absolute: 31 cells/uL (ref 15–500)
Eosinophils Relative: 0.5 %
HCT: 34.1 % — ABNORMAL LOW (ref 35.0–45.0)
Hemoglobin: 11.9 g/dL (ref 11.7–15.5)
Lymphs Abs: 2213 cells/uL (ref 850–3900)
MCH: 33.5 pg — ABNORMAL HIGH (ref 27.0–33.0)
MCHC: 34.9 g/dL (ref 32.0–36.0)
MCV: 96.1 fL (ref 80.0–100.0)
MPV: 9.6 fL (ref 7.5–12.5)
Monocytes Relative: 8.9 %
Neutro Abs: 3391 cells/uL (ref 1500–7800)
Neutrophils Relative %: 54.7 %
Platelets: 321 10*3/uL (ref 140–400)
RBC: 3.55 10*6/uL — ABNORMAL LOW (ref 3.80–5.10)
RDW: 12.5 % (ref 11.0–15.0)
Total Lymphocyte: 35.7 %
WBC mixed population: 552 cells/uL (ref 200–950)
WBC: 6.2 10*3/uL (ref 3.8–10.8)

## 2018-11-08 LAB — COMPREHENSIVE METABOLIC PANEL
AG Ratio: 1.8 (calc) (ref 1.0–2.5)
ALT: 5 U/L — ABNORMAL LOW (ref 6–29)
AST: 11 U/L (ref 10–35)
Albumin: 4.2 g/dL (ref 3.6–5.1)
Alkaline phosphatase (APISO): 52 U/L (ref 33–130)
BUN/Creatinine Ratio: 17 (calc) (ref 6–22)
BUN: 19 mg/dL (ref 7–25)
CO2: 26 mmol/L (ref 20–32)
Calcium: 9.4 mg/dL (ref 8.6–10.4)
Chloride: 103 mmol/L (ref 98–110)
Creat: 1.12 mg/dL — ABNORMAL HIGH (ref 0.50–1.05)
Globulin: 2.3 g/dL (calc) (ref 1.9–3.7)
Glucose, Bld: 134 mg/dL — ABNORMAL HIGH (ref 65–99)
Potassium: 3.8 mmol/L (ref 3.5–5.3)
Sodium: 139 mmol/L (ref 135–146)
Total Bilirubin: 0.6 mg/dL (ref 0.2–1.2)
Total Protein: 6.5 g/dL (ref 6.1–8.1)

## 2018-11-08 LAB — RPR: RPR Ser Ql: NONREACTIVE

## 2018-11-08 LAB — HIV-1 RNA QUANT-NO REFLEX-BLD
HIV 1 RNA Quant: 39 copies/mL — ABNORMAL HIGH
HIV-1 RNA Quant, Log: 1.59 Log copies/mL — ABNORMAL HIGH

## 2018-11-09 DIAGNOSIS — J449 Chronic obstructive pulmonary disease, unspecified: Secondary | ICD-10-CM | POA: Diagnosis not present

## 2018-11-09 DIAGNOSIS — I10 Essential (primary) hypertension: Secondary | ICD-10-CM | POA: Diagnosis not present

## 2018-11-09 DIAGNOSIS — B20 Human immunodeficiency virus [HIV] disease: Secondary | ICD-10-CM | POA: Diagnosis not present

## 2018-11-10 DIAGNOSIS — I10 Essential (primary) hypertension: Secondary | ICD-10-CM | POA: Diagnosis not present

## 2018-11-10 DIAGNOSIS — J449 Chronic obstructive pulmonary disease, unspecified: Secondary | ICD-10-CM | POA: Diagnosis not present

## 2018-11-10 DIAGNOSIS — B20 Human immunodeficiency virus [HIV] disease: Secondary | ICD-10-CM | POA: Diagnosis not present

## 2018-11-11 DIAGNOSIS — B20 Human immunodeficiency virus [HIV] disease: Secondary | ICD-10-CM | POA: Diagnosis not present

## 2018-11-11 DIAGNOSIS — J449 Chronic obstructive pulmonary disease, unspecified: Secondary | ICD-10-CM | POA: Diagnosis not present

## 2018-11-11 DIAGNOSIS — I10 Essential (primary) hypertension: Secondary | ICD-10-CM | POA: Diagnosis not present

## 2018-11-12 ENCOUNTER — Encounter: Payer: Self-pay | Admitting: Family

## 2018-11-12 ENCOUNTER — Ambulatory Visit (INDEPENDENT_AMBULATORY_CARE_PROVIDER_SITE_OTHER): Payer: Medicaid Other | Admitting: Family

## 2018-11-12 ENCOUNTER — Telehealth: Payer: Self-pay

## 2018-11-12 VITALS — BP 92/60 | HR 99 | Temp 98.4°F | Wt 99.0 lb

## 2018-11-12 DIAGNOSIS — B20 Human immunodeficiency virus [HIV] disease: Secondary | ICD-10-CM | POA: Diagnosis not present

## 2018-11-12 DIAGNOSIS — J0100 Acute maxillary sinusitis, unspecified: Secondary | ICD-10-CM | POA: Diagnosis not present

## 2018-11-12 DIAGNOSIS — J449 Chronic obstructive pulmonary disease, unspecified: Secondary | ICD-10-CM | POA: Diagnosis not present

## 2018-11-12 DIAGNOSIS — I10 Essential (primary) hypertension: Secondary | ICD-10-CM | POA: Diagnosis not present

## 2018-11-12 MED ORDER — AZELASTINE HCL 0.05 % OP SOLN: 1.0000 [drp] | Freq: Two times a day (BID) | OPHTHALMIC | 1 refills | Status: DC

## 2018-11-12 MED ORDER — METHYLPREDNISOLONE 4 MG PO TBPK: ORAL_TABLET | ORAL | 0 refills | Status: DC

## 2018-11-12 NOTE — Telephone Encounter (Signed)
Patient called office to inform us a PA must be submitted to insurance for Optivar eye drops. Patient states pharmacy will fax PA to our office.  Janice Williams

## 2018-11-12 NOTE — Patient Instructions (Signed)
Nice to meet you.  Please continue to take the doxycycline until completed.  It appears this may be allergy related.   We have sent in a prescription for prednisone and eye drops.  If the eye drops are too expensive you can try Naphcon A  General Recommendations:    Please drink plenty of fluids.  Get plenty of rest   Sleep in humidified air  Use saline nasal sprays  Netti pot   OTC Medications:  Decongestants - helps relieve congestion   Flonase (generic fluticasone) or Nasacort (generic triamcinolone) - please make sure to use the "cross-over" technique at a 45 degree angle towards the opposite eye as opposed to straight up the nasal passageway.   Sudafed (generic pseudoephedrine - Note this is the one that is available behind the pharmacy counter); Products with phenylephrine (-PE) may also be used but is often not as effective as pseudoephedrine.   If you have HIGH BLOOD PRESSURE - Coricidin HBP; AVOID any product that is -D as this contains pseudoephedrine which may increase your blood pressure.  Afrin (oxymetazoline) every 6-8 hours for up to 3 days.   Allergies - helps relieve runny nose, itchy eyes and sneezing   Claritin (generic loratidine), Allegra (fexofenidine), or Zyrtec (generic cyrterizine) for runny nose. These medications should not cause drowsiness.  Note - Benadryl (generic diphenhydramine) may be used however may cause drowsiness  Cough -   Delsym or Robitussin (generic dextromethorphan)  Expectorants - helps loosen mucus to ease removal   Mucinex (generic guaifenesin) as directed on the package.  Headaches / General Aches   Tylenol (generic acetaminophen) - DO NOT EXCEED 3 grams (3,000 mg) in a 24 hour time period  Advil/Motrin (generic ibuprofen)   Sore Throat -   Salt water gargle   Chloraseptic (generic benzocaine) spray or lozenges / Sucrets (generic dyclonine)

## 2018-11-12 NOTE — Progress Notes (Signed)
Subjective:    Patient ID: Janice Williams, female    DOB: 04-Dec-1961, 57 y.o.   MRN: [id number]  Chief Complaint  Patient presents with  . Sinusitis     HPI:  Janice Williams is a 57 y.o. female who presents today for an acute office visit.   Janice Williams was recently evaluated on 11/04/18 with worsening frontal sinus pan, headaches, eye pain and fatigue. Diagnosed with acute bacterial sinusitis and provided a prescription for Augmentin. She quickly developed diarrhea with a severity enough to effect her sleep and was changed to doxycycline on 11/20.   Janice Williams continues to have itchy, water eyes with pressure located on the right side of her face. She continues to take the doxycyline as prescribed and denies adverse side effects. Has tried to use Visine which has caused her burning. Does have some photosensitivity.      Allergies  Allergen Reactions  . Chantix [Varenicline] Itching      Outpatient Medications Prior to Visit  Medication Sig Dispense Refill  . albuterol (PROVENTIL HFA;VENTOLIN HFA) 108 (90 Base) MCG/ACT inhaler Inhale 2 puffs into the lungs every 6 (six) hours as needed for wheezing or shortness of breath. 1 Inhaler 5  . amLODipine (NORVASC) 10 MG tablet TAKE ONE TABLET BY MOUTH DAILY 30 tablet 0  . azithromycin (ZITHROMAX) 250 MG tablet Take 1 tablet (250 mg total) by mouth daily. Take first 2 tablets together, then 1 every day until finished. 6 tablet 0  . bictegravir-emtricitabine-tenofovir AF (BIKTARVY) 50-200-25 MG TABS tablet Take 1 tablet by mouth daily.    [name] doxycycline (VIBRA-TABS) 100 MG tablet Take 1 tablet (100 mg total) by mouth 2 (two) times daily. 14 tablet 0  . dronabinol (MARINOL) 5 MG capsule Take 1 capsule (5 mg total) by mouth 2 (two) times daily before a meal. 60 capsule 3  . ENSURE (ENSURE) Take 237 mLs by mouth 3 (three) times daily between meals. 237 mL 5  . fluticasone (FLONASE) 50 MCG/ACT nasal spray INSTILL 1 SPRAY INTO BOTH NOSTRILS DAILY  16 g 2  . hydrocortisone (ANUSOL-HC) 2.5 % rectal cream Apply rectally 2 times daily 28.35 g 0  . loratadine (CLARITIN) 10 MG tablet Take 1 tablet (10 mg total) by mouth daily. 30 tablet 0  . omeprazole (PRILOSEC) 20 MG capsule TAKE 1 CAPSULE BY MOUTH EVERY DAY 30 capsule 5  . ondansetron (ZOFRAN) 4 MG tablet Take 1 tablet (4 mg total) by mouth every 8 (eight) hours as needed for nausea or vomiting. 20 tablet 0  . polyethylene glycol (MIRALAX / GLYCOLAX) packet Take one capful in 8 oz of fluid of your choice twice a day until having soft stools, then back down to once a day.  If not having soft stools with twice a day, increased to three times a day (Patient taking differently: Take 17 g by mouth 2 (two) times daily as needed for mild constipation. ) 100 each 0  . pramoxine (PROCTOFOAM) 1 % foam Place 1 application rectally 3 (three) times daily as needed. 15 g 3  . pravastatin (PRAVACHOL) 20 MG tablet TAKE 1 TABLET BY MOUTH EVERY DAY 30 tablet 5  . sodium chloride (OCEAN) 0.65 % SOLN nasal spray Place 2 sprays into both nostrils 3 (three) times daily. 1 Bottle 0  . traMADol (ULTRAM) 50 MG tablet Take 1 tablet (50 mg total) by mouth every 12 (twelve) hours as needed. 60 tablet 0  . Witch Hazel (TUCKS) 50 % PADS  Use as needed for rectal itching, after having BM 40 each 0   No facility-administered medications prior to visit.      Past Medical History:  Diagnosis Date  . Allergy   . Anemia   . Arthritis    lower back, knees  . Bronchitis   . COPD (chronic obstructive pulmonary disease) ([place])   . Former smoker    quit 2014  . GERD (gastroesophageal reflux disease)   . HIV infection ([place])   . Hypertension   . Stroke ([place])   . 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 SVD (spontaneous vaginal delivery)    x 3     Past Surgical History:  Procedure Laterality Date  . MULTIPLE TOOTH EXTRACTIONS     with sedation  . TUBAL LIGATION    . UPPER GI ENDOSCOPY     normal per  patient - "years ago"    Review of Systems  Constitutional: Negative for chills and fever.  HENT: Positive for congestion and sinus pressure. Negative for ear pain, facial swelling, hearing loss, postnasal drip, rhinorrhea, sinus pain, sneezing, sore throat and voice change.   Eyes: Positive for discharge (Clear) and itching. Negative for redness.  Respiratory: Negative for cough, chest tightness, shortness of breath and wheezing.   Cardiovascular: Negative for chest pain.      Objective:    BP 92/60   Pulse 99   Temp 98.4 F (36.9 C)   Wt 99 lb (44.9 kg)   SpO2 100%   BMI 17.54 kg/m  Nursing note and vital signs reviewed.  Physical Exam  Constitutional: She is oriented to person, place, and time. She appears well-developed and well-nourished. No distress.  HENT:  Right Ear: Hearing, tympanic membrane, external ear and ear canal normal.  Left Ear: Hearing, tympanic membrane, external ear and ear canal normal.  Nose: Right sinus exhibits maxillary sinus tenderness. Right sinus exhibits no frontal sinus tenderness. Left sinus exhibits maxillary sinus tenderness. Left sinus exhibits no frontal sinus tenderness.  Mouth/Throat: Uvula is midline, oropharynx is clear and moist and mucous membranes are normal. Abnormal dentition.  Cardiovascular: Normal rate, regular rhythm, normal heart sounds and intact distal pulses. Exam reveals no gallop and no friction rub.  No murmur heard. Pulmonary/Chest: Effort normal and breath sounds normal. No respiratory distress. She has no wheezes. She has no rales. She exhibits no tenderness.  Neurological: She is alert and oriented to person, place, and time.  Skin: Skin is warm and dry.  Psychiatric: She has a normal mood and affect.       Assessment & Plan:   Problem List Items Addressed This Visit      Respiratory   Acute non-recurrent maxillary sinusitis - Primary    Janice Williams continues to have symptoms associated with sinusitis and is in  the process of completing her previously prescribed doxycycline. It appears she may be having some allergy related symptoms with the itchy, watery eyes. Plan to start a medrol dosepak to help with inflammation and allergies and azelastin as needed for eye itching. She will continue to take the doxycycline until completed. OTC medication list provided as needed for symptoms relief and supportive care. Follow up if symptoms worsen or do no improve.       Relevant Medications   methylPREDNISolone (MEDROL DOSEPAK) 4 MG TBPK tablet       I am having Janice Williams start on azelastine and methylPREDNISolone. I am also having her maintain  her polyethylene glycol, TUCKS, sodium chloride, loratadine, ondansetron, albuterol, ENSURE, dronabinol, fluticasone, omeprazole, pravastatin, hydrocortisone, pramoxine, traMADol, amLODipine, azithromycin, doxycycline, and bictegravir-emtricitabine-tenofovir AF.   Meds ordered this encounter  Medications  . azelastine (OPTIVAR) 0.05 % ophthalmic solution    Sig: Place 1 drop into both eyes 2 (two) times daily.    Dispense:  6 mL    Refill:  1    Order Specific Question:   Supervising Provider    Answer:   Carlyle Basques [[id number]]  . methylPREDNISolone (MEDROL DOSEPAK) 4 MG TBPK tablet    Sig: Take as directed.    Dispense:  21 tablet    Refill:  0    Order Specific Question:   Supervising Provider    Answer:   Carlyle Basques [[id number]]     Follow-up:  As needed if symptoms worsen or do improve.   Terri Piedra, MSN, FNP-C Nurse Practitioner [facility] Office phone: [phone] Pager: [place] number: [phone]

## 2018-11-12 NOTE — Assessment & Plan Note (Signed)
Ms. Hiott continues to have symptoms associated with sinusitis and is in the process of completing her previously prescribed doxycycline. It appears she may be having some allergy related symptoms with the itchy, watery eyes. Plan to start a medrol dosepak to help with inflammation and allergies and azelastin as needed for eye itching. She will continue to take the doxycycline until completed. OTC medication list provided as needed for symptoms relief and supportive care. Follow up if symptoms worsen or do no improve.

## 2018-11-13 ENCOUNTER — Telehealth: Payer: Self-pay | Admitting: Behavioral Health

## 2018-11-13 ENCOUNTER — Other Ambulatory Visit: Payer: Self-pay | Admitting: Family

## 2018-11-13 DIAGNOSIS — B20 Human immunodeficiency virus [HIV] disease: Secondary | ICD-10-CM | POA: Diagnosis not present

## 2018-11-13 DIAGNOSIS — I10 Essential (primary) hypertension: Secondary | ICD-10-CM | POA: Diagnosis not present

## 2018-11-13 DIAGNOSIS — J449 Chronic obstructive pulmonary disease, unspecified: Secondary | ICD-10-CM | POA: Diagnosis not present

## 2018-11-13 MED ORDER — OLOPATADINE HCL 0.2 % OP SOLN: 1.0000 [drp] | Freq: Every day | OPHTHALMIC | 2 refills | Status: DC

## 2018-11-13 NOTE — Telephone Encounter (Signed)
Sonnie Alamo called stating she needed a PA initiated for Azelastine Eye drops.  Medicaid does not cover this Azelastine.  Called to see the preferred medication and they gave three options.  Brand name Cromolyn, Pataday and Pazeo.   Pricilla Riffle RN

## 2018-11-13 NOTE — Telephone Encounter (Signed)
Pataday sent to pharmacy. Can also use over the counter Naphcon A.

## 2018-11-14 DIAGNOSIS — B20 Human immunodeficiency virus [HIV] disease: Secondary | ICD-10-CM | POA: Diagnosis not present

## 2018-11-14 DIAGNOSIS — I10 Essential (primary) hypertension: Secondary | ICD-10-CM | POA: Diagnosis not present

## 2018-11-14 DIAGNOSIS — J449 Chronic obstructive pulmonary disease, unspecified: Secondary | ICD-10-CM | POA: Diagnosis not present

## 2018-11-15 DIAGNOSIS — I10 Essential (primary) hypertension: Secondary | ICD-10-CM | POA: Diagnosis not present

## 2018-11-15 DIAGNOSIS — J449 Chronic obstructive pulmonary disease, unspecified: Secondary | ICD-10-CM | POA: Diagnosis not present

## 2018-11-15 DIAGNOSIS — B20 Human immunodeficiency virus [HIV] disease: Secondary | ICD-10-CM | POA: Diagnosis not present

## 2018-11-16 DIAGNOSIS — B20 Human immunodeficiency virus [HIV] disease: Secondary | ICD-10-CM | POA: Diagnosis not present

## 2018-11-16 DIAGNOSIS — I10 Essential (primary) hypertension: Secondary | ICD-10-CM | POA: Diagnosis not present

## 2018-11-16 DIAGNOSIS — J449 Chronic obstructive pulmonary disease, unspecified: Secondary | ICD-10-CM | POA: Diagnosis not present

## 2018-11-17 DIAGNOSIS — B20 Human immunodeficiency virus [HIV] disease: Secondary | ICD-10-CM | POA: Diagnosis not present

## 2018-11-17 DIAGNOSIS — I10 Essential (primary) hypertension: Secondary | ICD-10-CM | POA: Diagnosis not present

## 2018-11-17 DIAGNOSIS — J449 Chronic obstructive pulmonary disease, unspecified: Secondary | ICD-10-CM | POA: Diagnosis not present

## 2018-11-18 ENCOUNTER — Telehealth: Payer: Self-pay | Admitting: Gastroenterology

## 2018-11-18 DIAGNOSIS — B20 Human immunodeficiency virus [HIV] disease: Secondary | ICD-10-CM | POA: Diagnosis not present

## 2018-11-18 DIAGNOSIS — J449 Chronic obstructive pulmonary disease, unspecified: Secondary | ICD-10-CM | POA: Diagnosis not present

## 2018-11-18 DIAGNOSIS — I10 Essential (primary) hypertension: Secondary | ICD-10-CM | POA: Diagnosis not present

## 2018-11-18 NOTE — Telephone Encounter (Signed)
Letter faxed per the patient's request.

## 2018-11-19 ENCOUNTER — Telehealth: Payer: Self-pay

## 2018-11-19 DIAGNOSIS — B20 Human immunodeficiency virus [HIV] disease: Secondary | ICD-10-CM | POA: Diagnosis not present

## 2018-11-19 DIAGNOSIS — J449 Chronic obstructive pulmonary disease, unspecified: Secondary | ICD-10-CM | POA: Diagnosis not present

## 2018-11-19 DIAGNOSIS — I10 Essential (primary) hypertension: Secondary | ICD-10-CM | POA: Diagnosis not present

## 2018-11-19 NOTE — Telephone Encounter (Signed)
Patient is calling with continued ringing in her ears and states she was told to call if this continued.  It has been three weeks now.  She has requested a referral to ENT. She called [facility] and was told she needed referral.  Appointment scheduled with Dr Blenda Nicely for December 16, @ 8:30 am.   Laverle Patter RN

## 2018-11-20 DIAGNOSIS — J449 Chronic obstructive pulmonary disease, unspecified: Secondary | ICD-10-CM | POA: Diagnosis not present

## 2018-11-20 DIAGNOSIS — B20 Human immunodeficiency virus [HIV] disease: Secondary | ICD-10-CM | POA: Diagnosis not present

## 2018-11-20 DIAGNOSIS — I10 Essential (primary) hypertension: Secondary | ICD-10-CM | POA: Diagnosis not present

## 2018-11-21 ENCOUNTER — Ambulatory Visit (AMBULATORY_SURGERY_CENTER): Payer: Medicaid Other | Admitting: Gastroenterology

## 2018-11-21 ENCOUNTER — Encounter: Payer: Self-pay | Admitting: Gastroenterology

## 2018-11-21 VITALS — BP 118/74 | HR 63 | Temp 97.8°F | Resp 13 | Ht 63.0 in | Wt 99.0 lb

## 2018-11-21 DIAGNOSIS — Z1211 Encounter for screening for malignant neoplasm of colon: Secondary | ICD-10-CM

## 2018-11-21 DIAGNOSIS — D123 Benign neoplasm of transverse colon: Secondary | ICD-10-CM

## 2018-11-21 DIAGNOSIS — I10 Essential (primary) hypertension: Secondary | ICD-10-CM | POA: Diagnosis not present

## 2018-11-21 DIAGNOSIS — B20 Human immunodeficiency virus [HIV] disease: Secondary | ICD-10-CM | POA: Diagnosis not present

## 2018-11-21 DIAGNOSIS — J449 Chronic obstructive pulmonary disease, unspecified: Secondary | ICD-10-CM | POA: Diagnosis not present

## 2018-11-21 DIAGNOSIS — D122 Benign neoplasm of ascending colon: Secondary | ICD-10-CM

## 2018-11-21 MED ORDER — SODIUM CHLORIDE 0.9 % IV SOLN: 500.0000 mL | Freq: Once | INTRAVENOUS | Status: DC

## 2018-11-21 NOTE — Progress Notes (Signed)
Report given to PACU, vss 

## 2018-11-21 NOTE — Patient Instructions (Addendum)
Handouts given on polyps and diverticulosis and hemorrhoids.   YOU HAD AN ENDOSCOPIC PROCEDURE TODAY AT [facility]:   Refer to the procedure report that was given to you for any specific questions about what was found during the examination.  If the procedure report does not answer your questions, please call your gastroenterologist to clarify.  If you requested that your care partner not be given the details of your procedure findings, then the procedure report has been included in a sealed envelope for you to review at your convenience later.  YOU SHOULD EXPECT: Some feelings of bloating in the abdomen. Passage of more gas than usual.  Walking can help get rid of the air that was put into your GI tract during the procedure and reduce the bloating. If you had a lower endoscopy (such as a colonoscopy or flexible sigmoidoscopy) you may notice spotting of blood in your stool or on the toilet paper. If you underwent a bowel prep for your procedure, you may not have a normal bowel movement for a few days.  Please Note:  You might notice some irritation and congestion in your nose or some drainage.  This is from the oxygen used during your procedure.  There is no need for concern and it should clear up in a day or so.  SYMPTOMS TO REPORT IMMEDIATELY:   Following lower endoscopy (colonoscopy or flexible sigmoidoscopy):  Excessive amounts of blood in the stool  Significant tenderness or worsening of abdominal pains  Swelling of the abdomen that is new, acute  Fever of 100F or higher   For urgent or emergent issues, a gastroenterologist can be reached at any hour by calling [phone].   DIET:  We do recommend a small meal at first, but then you may proceed to your regular diet.  Drink plenty of fluids but you should avoid alcoholic beverages for 24 hours.  ACTIVITY:  You should plan to take it easy for the rest of today and you should NOT DRIVE or use heavy machinery until  tomorrow (because of the sedation medicines used during the test).    FOLLOW UP: Our staff will call the number listed on your records the next business day following your procedure to check on you and address any questions or concerns that you may have regarding the information given to you following your procedure. If we do not reach you, we will leave a message.  However, if you are feeling well and you are not experiencing any problems, there is no need to return our call.  We will assume that you have returned to your regular daily activities without incident.  If any biopsies were taken you will be contacted by phone or by letter within the next 1-3 weeks.  Please call us at [phone] if you have not heard about the biopsies in 3 weeks.    SIGNATURES/CONFIDENTIALITY: You and/or your care partner have signed paperwork which will be entered into your electronic medical record.  These signatures attest to the fact that that the information above on your After Visit Summary has been reviewed and is understood.  Full responsibility of the confidentiality of this discharge information lies with you and/or your care-partner.

## 2018-11-21 NOTE — Op Note (Addendum)
[place] Patient Name: Janice Williams Procedure Date: 11/21/2018 8:32 AM MRN: [id number] Endoscopist: Mauri Pole , MD Age: 57 Referring MD:  Date of Birth: [birth year] Gender: Female Account #: [id number] Procedure:                Colonoscopy Indications:              Screening for colorectal malignant neoplasm Medicines:                Monitored Anesthesia Care Procedure:                Pre-Anesthesia Assessment:                           - Prior to the procedure, a History and Physical                            was performed, and patient medications and                            allergies were reviewed. The patient's tolerance of                            previous anesthesia was also reviewed. The risks                            and benefits of the procedure and the sedation                            options and risks were discussed with the patient.                            All questions were answered, and informed consent                            was obtained. Prior Anticoagulants: The patient has                            taken no previous anticoagulant or antiplatelet                            agents. ASA Grade Assessment: III - A patient with                            severe systemic disease. After reviewing the risks                            and benefits, the patient was deemed in                            satisfactory condition to undergo the procedure.                           After obtaining informed consent, the colonoscope  was passed under direct vision. Throughout the                            procedure, the patient's blood pressure, pulse, and                            oxygen saturations were monitored continuously. The                            Colonoscope was introduced through the anus and                            advanced to the the cecum, identified by                            appendiceal orifice  and ileocecal valve. The                            colonoscopy was performed without difficulty. The                            patient tolerated the procedure well. The quality                            of the bowel preparation was adequate. The                            ileocecal valve, appendiceal orifice, and rectum                            were photographed. Scope In: 8:39:18 AM Scope Out: 9:05:28 AM Scope Withdrawal Time: 0 hours 14 minutes 49 seconds  Total Procedure Duration: 0 hours 26 minutes 10 seconds  Findings:                 The perianal and digital rectal examinations were                            normal.                           Six sessile polyps were found in the transverse                            colon and ascending colon. The polyps were 4 to 9                            mm in size. These polyps were removed with a cold                            snare. Resection and retrieval were complete.                           Scattered small and large-mouthed diverticula were  found in the sigmoid colon, descending colon,                            transverse colon and ascending colon. There was                            evidence of an impacted diverticulum.                           Non-bleeding internal hemorrhoids were found during                            retroflexion. The hemorrhoids were medium-sized. Complications:            No immediate complications. Estimated Blood Loss:     Estimated blood loss was minimal. Impression:               - Six 4 to 9 mm polyps in the transverse colon and                            in the ascending colon, removed with a cold snare.                            Resected and retrieved.                           - Moderate diverticulosis in the sigmoid colon, in                            the descending colon, in the transverse colon and                            in the ascending colon. There was  evidence of an                            impacted diverticulum.                           - Non-bleeding internal hemorrhoids. Recommendation:           - Patient has a contact number available for                            emergencies. The signs and symptoms of potential                            delayed complications were discussed with the                            patient. Return to normal activities tomorrow.                            Written discharge instructions were provided to the  patient.                           - Resume previous diet.                           - Continue present medications.                           - Await pathology results.                           - Repeat colonoscopy in 3 - 5 years for                            surveillance based on pathology results. Mauri Pole, MD 11/21/2018 9:11:01 AM This report has been signed electronically.

## 2018-11-21 NOTE — Progress Notes (Signed)
Called to room to assist during endoscopic procedure.  Patient ID and intended procedure confirmed with present staff. Received instructions for my participation in the procedure from the performing physician.  

## 2018-11-21 NOTE — Progress Notes (Signed)
Pt's states no medical or surgical changes since previsit or office visit. 

## 2018-11-22 ENCOUNTER — Other Ambulatory Visit: Payer: Self-pay | Admitting: Internal Medicine

## 2018-11-22 ENCOUNTER — Telehealth: Payer: Self-pay | Admitting: *Deleted

## 2018-11-22 DIAGNOSIS — J449 Chronic obstructive pulmonary disease, unspecified: Secondary | ICD-10-CM | POA: Diagnosis not present

## 2018-11-22 DIAGNOSIS — B20 Human immunodeficiency virus [HIV] disease: Secondary | ICD-10-CM | POA: Diagnosis not present

## 2018-11-22 DIAGNOSIS — I10 Essential (primary) hypertension: Secondary | ICD-10-CM

## 2018-11-22 NOTE — Telephone Encounter (Signed)
  Follow up Call-  Call back number 11/21/2018  Post procedure Call Back phone  # [phone]  Permission to leave phone message Yes  Some recent data might be hidden     Patient questions:  Do you have a fever, pain , or abdominal swelling? No. Pain Score  0 *  Have you tolerated food without any problems? Yes.    Have you been able to return to your normal activities? Yes.    Do you have any questions about your discharge instructions: Diet   No. Medications  No. Follow up visit  No.  Do you have questions or concerns about your Care? No.  Actions: * If pain score is 4 or above: No action needed, pain <4.

## 2018-11-23 DIAGNOSIS — I10 Essential (primary) hypertension: Secondary | ICD-10-CM | POA: Diagnosis not present

## 2018-11-23 DIAGNOSIS — J449 Chronic obstructive pulmonary disease, unspecified: Secondary | ICD-10-CM | POA: Diagnosis not present

## 2018-11-23 DIAGNOSIS — B20 Human immunodeficiency virus [HIV] disease: Secondary | ICD-10-CM | POA: Diagnosis not present

## 2018-11-24 DIAGNOSIS — B20 Human immunodeficiency virus [HIV] disease: Secondary | ICD-10-CM | POA: Diagnosis not present

## 2018-11-24 DIAGNOSIS — J449 Chronic obstructive pulmonary disease, unspecified: Secondary | ICD-10-CM | POA: Diagnosis not present

## 2018-11-24 DIAGNOSIS — I10 Essential (primary) hypertension: Secondary | ICD-10-CM | POA: Diagnosis not present

## 2018-11-25 DIAGNOSIS — J449 Chronic obstructive pulmonary disease, unspecified: Secondary | ICD-10-CM | POA: Diagnosis not present

## 2018-11-25 DIAGNOSIS — I10 Essential (primary) hypertension: Secondary | ICD-10-CM | POA: Diagnosis not present

## 2018-11-25 DIAGNOSIS — B20 Human immunodeficiency virus [HIV] disease: Secondary | ICD-10-CM | POA: Diagnosis not present

## 2018-11-26 ENCOUNTER — Other Ambulatory Visit: Payer: Self-pay

## 2018-11-26 ENCOUNTER — Ambulatory Visit (HOSPITAL_COMMUNITY)
Admission: EM | Admit: 2018-11-26 | Discharge: 2018-11-26 | Disposition: A | Discharge status: Home / Self Care | Payer: Medicaid Other | Attending: Emergency Medicine | Admitting: Emergency Medicine

## 2018-11-26 ENCOUNTER — Encounter (HOSPITAL_COMMUNITY): Payer: Self-pay | Admitting: Emergency Medicine

## 2018-11-26 DIAGNOSIS — J449 Chronic obstructive pulmonary disease, unspecified: Secondary | ICD-10-CM | POA: Diagnosis not present

## 2018-11-26 DIAGNOSIS — B20 Human immunodeficiency virus [HIV] disease: Secondary | ICD-10-CM | POA: Diagnosis not present

## 2018-11-26 DIAGNOSIS — I10 Essential (primary) hypertension: Secondary | ICD-10-CM | POA: Diagnosis not present

## 2018-11-26 DIAGNOSIS — J019 Acute sinusitis, unspecified: Secondary | ICD-10-CM

## 2018-11-26 MED ORDER — TRIAMCINOLONE ACETONIDE 55 MCG/ACT NA AERO: 2.0000 | INHALATION_SPRAY | Freq: Every day | NASAL | 0 refills | Status: DC

## 2018-11-26 MED ORDER — CEFDINIR 300 MG PO CAPS: 300.0000 mg | ORAL_CAPSULE | Freq: Two times a day (BID) | ORAL | 0 refills | Status: AC

## 2018-11-26 NOTE — Discharge Instructions (Signed)
Please follow-up with ENT as planned on 12/16. Please begin Omnicef twice daily for the next week Instead of Flonase please try Nasacort May continue to use Sudafed in combination with daily Claritin  Follow-up if developing swelling, difficulty moving neck, difficulty swallowing

## 2018-11-26 NOTE — ED Triage Notes (Addendum)
PT reports drainage, sinues pressure, facial pain, and ear pain for over a week. PT is dizzy as well.   PT reports nausea and ears ringing. PT has ENT appt 12/16

## 2018-11-27 DIAGNOSIS — I10 Essential (primary) hypertension: Secondary | ICD-10-CM | POA: Diagnosis not present

## 2018-11-27 DIAGNOSIS — B20 Human immunodeficiency virus [HIV] disease: Secondary | ICD-10-CM | POA: Diagnosis not present

## 2018-11-27 DIAGNOSIS — J449 Chronic obstructive pulmonary disease, unspecified: Secondary | ICD-10-CM | POA: Diagnosis not present

## 2018-11-27 NOTE — ED Provider Notes (Signed)
[facility]    CSN: [id number] Arrival date & time: 11/26/18  1043     History   Chief Complaint Chief Complaint  Patient presents with  . Facial Pain    HPI Janice Williams is a 57 y.o. female history of COPD, allergic rhinitis, presenting today for evaluation of URI symptoms.  Patient states that over the past 3 to 4 weeks she has had nasal congestion, drainage, sinus pressure as well as facial pain.  She has had occasional nausea.  She has been seen multiple times for this.  She initially was put on azithromycin for COPD exacerbation, then she was evaluated by her PCP and put on Augmentin, this was then switched to doxycycline, she continued to not have any improvement of symptoms.  She was put on olopatadine eyedrops as well as Medrol Dosepak which has also not provided any further relief.  She has plans to follow-up with ENT on 12/16.  Is presenting today she has continued discomfort.  She is also been using daily allergy pills, Flonase, Sinex sinus rinse without relief.  HPI  Past Medical History:  Diagnosis Date  . Allergy   . Anemia   . Arthritis    lower back, knees  . Bronchitis   . COPD (chronic obstructive pulmonary disease) ([place])   . Former smoker    quit 2014  . GERD (gastroesophageal reflux disease)   . HIV infection ([place])   . Hypertension   . Stroke ([place])   . 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 SVD (spontaneous vaginal delivery)    x 3    Patient Active Problem List   Diagnosis Date Noted  . Moderate protein-calorie malnutrition ([place]) 05/31/2018  . COPD with acute exacerbation ([place]) 06/09/2017  . Hypertension 06/08/2017  . Dental abscess 03/15/2015  . Knee pain, bilateral 03/15/2015  . Acne cystica 12/22/2011  . Dyslipidemia 07/06/2011  . Routine health maintenance 07/06/2011  . EPIDERMOID CYST 02/10/2011  . Acute non-recurrent maxillary sinusitis 01/20/2010  . EXTERNAL OTITIS 01/06/2010  . Hyperlipidemia 06/03/2009  .  WEIGHT LOSS 06/03/2009  . CARBUNCLE AND FURUNCLE OF FACE 02/04/2009  . FACIAL RASH 02/04/2009  . Human immunodeficiency virus (HIV) disease ([place]) 10/29/2008  . ANEMIA-NOS 10/29/2008  . PERIPHERAL NEUROPATHY 10/29/2008  . GERD 10/29/2008  . RENAL INSUFFICIENCY 10/29/2008  . CEREBROVASCULAR ACCIDENT, HX OF 10/29/2008  . TOBACCO DEPENDENCE 02/14/2007  . CVA 02/14/2007  . RHINITIS, ALLERGIC 02/14/2007  . DYSPEPSIA 02/14/2007  . ACNE 02/14/2007  . WEIGHT LOSS, ABNORMAL 02/14/2007    Past Surgical History:  Procedure Laterality Date  . MULTIPLE TOOTH EXTRACTIONS     with sedation  . TUBAL LIGATION    . UPPER GI ENDOSCOPY     normal per patient - "years ago"    OB History   None      Home Medications    Prior to Admission medications   Medication Sig Start Date End Date Taking? Authorizing Provider  albuterol (PROVENTIL HFA;VENTOLIN HFA) 108 (90 Base) MCG/ACT inhaler Inhale 2 puffs into the lungs every 6 (six) hours as needed for wheezing or shortness of breath. 01/08/18  Yes Carlyle Basques, MD  amLODipine (NORVASC) 10 MG tablet TAKE ONE TABLET BY MOUTH DAILY 11/22/18  Yes Golden Circle, FNP  bictegravir-emtricitabine-tenofovir AF (BIKTARVY) 50-200-25 MG TABS tablet Take 1 tablet by mouth daily.    Yes [provider]  dronabinol (MARINOL) 5 MG capsule Take 1 capsule (5 mg total) by  mouth 2 (two) times daily before a meal. 07/10/18  Yes Carlyle Basques, MD  fluticasone [facility]) 50 MCG/ACT nasal spray INSTILL 1 SPRAY INTO BOTH NOSTRILS DAILY 08/09/18  Yes Carlyle Basques, MD  loratadine (CLARITIN) 10 MG tablet Take 1 tablet (10 mg total) by mouth daily. 06/10/17  Yes Jule Ser, DO  Olopatadine HCl (PATADAY) 0.2 % SOLN Apply 1 drop to eye daily. 11/13/18  Yes Golden Circle, FNP  omeprazole (PRILOSEC) 20 MG capsule TAKE 1 CAPSULE BY MOUTH EVERY DAY 08/29/18  Yes Carlyle Basques, MD  polyethylene glycol [facility] / GLYCOLAX) packet Take one capful in 8 oz of fluid  of your choice twice a day until having soft stools, then back down to once a day.  If not having soft stools with twice a day, increased to three times a day 07/21/15  Yes Linton Flemings, MD  pravastatin (PRAVACHOL) 20 MG tablet TAKE 1 TABLET BY MOUTH EVERY DAY 08/29/18  Yes Carlyle Basques, MD  traMADol (ULTRAM) 50 MG tablet Take 1 tablet (50 mg total) by mouth every 12 (twelve) hours as needed. 10/04/18  Yes Carlyle Basques, MD  azelastine (OPTIVAR) 0.05 % ophthalmic solution Place 1 drop into both eyes 2 (two) times daily. Patient not taking: Reported on 11/21/2018 11/12/18   Golden Circle, FNP  cefdinir (OMNICEF) 300 MG capsule Take 1 capsule (300 mg total) by mouth 2 (two) times daily for 7 days. 11/26/18 12/03/18  Alexzia Kasler C, PA-C  ENSURE (ENSURE) Take 237 mLs by mouth 3 (three) times daily between meals. 07/10/18   Carlyle Basques, MD  hydrocortisone (ANUSOL-HC) 2.5 % rectal cream Apply rectally 2 times daily Patient not taking: Reported on 11/21/2018 09/07/18   Suella Broad A, PA-C  ondansetron (ZOFRAN) 4 MG tablet Take 1 tablet (4 mg total) by mouth every 8 (eight) hours as needed for nausea or vomiting. Patient not taking: Reported on 11/21/2018 10/08/17   Carlyle Basques, MD  pramoxine (PROCTOFOAM) 1 % foam Place 1 application rectally 3 (three) times daily as needed. Patient not taking: Reported on 11/21/2018 09/17/18   Carlyle Basques, MD  sodium chloride (OCEAN) 0.65 % SOLN nasal spray Place 2 sprays into both nostrils 3 (three) times daily. Patient not taking: Reported on 11/21/2018 06/10/17   Jule Ser, DO  triamcinolone (NASACORT) 55 MCG/ACT AERO nasal inhaler Place 2 sprays into the nose daily. 11/26/18   Willella Harding C, PA-C  Witch Hazel (TUCKS) 50 % PADS Use as needed for rectal itching, after having BM 07/21/15   Linton Flemings, MD    Family History Family History  Problem Relation Age of Onset  . Hypertension Mother   . Hypertension Father   . Hyperlipidemia Father     . Colon cancer Neg Hx   . Rectal cancer Neg Hx   . Stomach cancer Neg Hx     Social History Social History   Tobacco Use  . Smoking status: Former Smoker    Packs/day: 0.40    Years: 38.00    Pack years: 15.20    Types: Cigarettes    Start date: 12/18/2012    Last attempt to quit: 11/21/2013    Years since quitting: 5.0  . Smokeless tobacco: Never Used  Substance Use Topics  . Alcohol use: Yes    Alcohol/week: 0.0 standard drinks    Comment: "occasional"  Beer  . Drug use: No    Comment: Hx - clean for 7 years     Allergies   Chantix [varenicline]  Review of Systems Review of Systems  Constitutional: Negative for activity change, appetite change, chills, fatigue and fever.  HENT: Positive for congestion, ear pain, rhinorrhea and sinus pressure. Negative for sore throat and trouble swallowing.   Eyes: Negative for discharge and redness.  Respiratory: Negative for cough, chest tightness and shortness of breath.   Cardiovascular: Negative for chest pain.  Gastrointestinal: Negative for abdominal pain, diarrhea, nausea and vomiting.  Musculoskeletal: Negative for myalgias.  Skin: Negative for rash.  Neurological: Negative for dizziness, light-headedness and headaches.     Physical Exam Triage Vital Signs ED Triage Vitals  Enc Vitals Group     BP 11/26/18 1139 117/84     Pulse Rate 11/26/18 1139 (!) 104     Resp 11/26/18 1139 16     Temp 11/26/18 1139 98.8 F (37.1 C)     Temp Source 11/26/18 1139 Oral     SpO2 11/26/18 1139 98 %     Weight --      Height --      Head Circumference --      Peak Flow --      Pain Score 11/26/18 1141 9     Pain Loc --      Pain Edu? --      Excl. in [place]? --    No data found.  Updated Vital Signs BP 117/84 (BP Location: Right Arm)   Pulse (!) 104   Temp 98.8 F (37.1 C) (Oral)   Resp 16   SpO2 98%   Visual Acuity Right Eye Distance:   Left Eye Distance:   Bilateral Distance:    Right Eye Near:   Left Eye Near:     Bilateral Near:     Physical Exam  Constitutional: She appears well-developed and well-nourished. No distress.  HENT:  Head: Normocephalic and atraumatic.  Bilateral ears without tenderness to palpation of external auricle, tragus and mastoid, EAC's without erythema or swelling, TM's with good bony landmarks and cone of light. Non erythematous.  Swollen turbinates with erythematous nasal mucosa  Oral mucosa pink and moist, no tonsillar enlargement or exudate. Posterior pharynx patent and nonerythematous, no uvula deviation or swelling. Normal phonation.  Eyes: Conjunctivae are normal.  Neck: Neck supple.  Cardiovascular: Normal rate and regular rhythm.  No murmur heard. Pulmonary/Chest: Effort normal and breath sounds normal. No respiratory distress.  Breathing comfortably at rest, CTABL, no wheezing, rales or other adventitious sounds auscultated  Abdominal: Soft. There is no tenderness.  Musculoskeletal: She exhibits no edema.  Neurological: She is alert.  Skin: Skin is warm and dry.  Psychiatric: She has a normal mood and affect.  Nursing note and vitals reviewed.    UC Treatments / Results  Labs (all labs ordered are listed, but only abnormal results are displayed) Labs Reviewed - No data to display  EKG None  Radiology No results found.  Procedures Procedures (including critical care time)  Medications Ordered in UC Medications - No data to display  Initial Impression / Assessment and Plan / UC Course  I have reviewed the triage vital signs and the nursing notes.  Pertinent labs & imaging results that were available during my care of the patient were reviewed by me and considered in my medical decision making (see chart for details).     Patient has been on multiple antibiotics without relief, discussed hesitancy to provide an additional antibiotic given failure to improve with others.  Despite this will provide Omnicef, given conversation with patient.  Discussed trying Nasacort as alternative to Flonase.  May try Claritin D or Zyrtec-D as alternative to plain allergy pill.  Discussed continuing with follow-up with ENT for further evaluation as her symptoms have not been relieved with standard treatments.Discussed strict return precautions. Patient verbalized understanding and is agreeable with plan.  Final Clinical Impressions(s) / UC Diagnoses   Final diagnoses:  Acute sinusitis with symptoms > 10 days     Discharge Instructions     Please follow-up with ENT as planned on 12/16. Please begin Omnicef twice daily for the next week Instead of Flonase please try Nasacort May continue to use Sudafed in combination with daily Claritin  Follow-up if developing swelling, difficulty moving neck, difficulty swallowing   ED Prescriptions    Medication Sig Dispense Auth. Provider   cefdinir (OMNICEF) 300 MG capsule Take 1 capsule (300 mg total) by mouth 2 (two) times daily for 7 days. 14 capsule Erabella Kuipers C, PA-C   triamcinolone (NASACORT) 55 MCG/ACT AERO nasal inhaler Place 2 sprays into the nose daily. 1 Inhaler Adiel Erney C, PA-C     Controlled Substance Prescriptions [place] Controlled Substance Registry consulted? Not Applicable   Janith Lima, [place] 11/27/18 1107

## 2018-11-28 DIAGNOSIS — I10 Essential (primary) hypertension: Secondary | ICD-10-CM | POA: Diagnosis not present

## 2018-11-28 DIAGNOSIS — B20 Human immunodeficiency virus [HIV] disease: Secondary | ICD-10-CM | POA: Diagnosis not present

## 2018-11-28 DIAGNOSIS — J449 Chronic obstructive pulmonary disease, unspecified: Secondary | ICD-10-CM | POA: Diagnosis not present

## 2018-11-29 DIAGNOSIS — I10 Essential (primary) hypertension: Secondary | ICD-10-CM | POA: Diagnosis not present

## 2018-11-29 DIAGNOSIS — J449 Chronic obstructive pulmonary disease, unspecified: Secondary | ICD-10-CM | POA: Diagnosis not present

## 2018-11-29 DIAGNOSIS — B20 Human immunodeficiency virus [HIV] disease: Secondary | ICD-10-CM | POA: Diagnosis not present

## 2018-11-30 DIAGNOSIS — B20 Human immunodeficiency virus [HIV] disease: Secondary | ICD-10-CM | POA: Diagnosis not present

## 2018-11-30 DIAGNOSIS — J449 Chronic obstructive pulmonary disease, unspecified: Secondary | ICD-10-CM | POA: Diagnosis not present

## 2018-11-30 DIAGNOSIS — I10 Essential (primary) hypertension: Secondary | ICD-10-CM | POA: Diagnosis not present

## 2018-12-01 DIAGNOSIS — I10 Essential (primary) hypertension: Secondary | ICD-10-CM | POA: Diagnosis not present

## 2018-12-01 DIAGNOSIS — J449 Chronic obstructive pulmonary disease, unspecified: Secondary | ICD-10-CM | POA: Diagnosis not present

## 2018-12-01 DIAGNOSIS — B20 Human immunodeficiency virus [HIV] disease: Secondary | ICD-10-CM | POA: Diagnosis not present

## 2018-12-02 ENCOUNTER — Telehealth: Payer: Self-pay | Admitting: Behavioral Health

## 2018-12-02 ENCOUNTER — Encounter: Payer: Self-pay | Admitting: Gastroenterology

## 2018-12-02 ENCOUNTER — Ambulatory Visit (INDEPENDENT_AMBULATORY_CARE_PROVIDER_SITE_OTHER): Payer: Medicaid Other | Admitting: Internal Medicine

## 2018-12-02 ENCOUNTER — Other Ambulatory Visit: Payer: Self-pay | Admitting: Internal Medicine

## 2018-12-02 ENCOUNTER — Encounter: Payer: Self-pay | Admitting: Family Medicine

## 2018-12-02 ENCOUNTER — Ambulatory Visit (INDEPENDENT_AMBULATORY_CARE_PROVIDER_SITE_OTHER): Payer: Medicaid Other | Admitting: Family Medicine

## 2018-12-02 ENCOUNTER — Encounter: Payer: Self-pay | Admitting: Internal Medicine

## 2018-12-02 VITALS — BP 109/75 | HR 98 | Temp 98.0°F | Ht 62.0 in | Wt 99.0 lb

## 2018-12-02 VITALS — BP 113/89 | HR 111 | Wt 99.0 lb

## 2018-12-02 DIAGNOSIS — N764 Abscess of vulva: Secondary | ICD-10-CM

## 2018-12-02 DIAGNOSIS — M1612 Unilateral primary osteoarthritis, left hip: Secondary | ICD-10-CM | POA: Diagnosis not present

## 2018-12-02 DIAGNOSIS — Z972 Presence of dental prosthetic device (complete) (partial): Secondary | ICD-10-CM | POA: Diagnosis not present

## 2018-12-02 DIAGNOSIS — B20 Human immunodeficiency virus [HIV] disease: Secondary | ICD-10-CM

## 2018-12-02 DIAGNOSIS — R0981 Nasal congestion: Secondary | ICD-10-CM | POA: Diagnosis not present

## 2018-12-02 DIAGNOSIS — I10 Essential (primary) hypertension: Secondary | ICD-10-CM | POA: Diagnosis not present

## 2018-12-02 DIAGNOSIS — H9313 Tinnitus, bilateral: Secondary | ICD-10-CM | POA: Diagnosis not present

## 2018-12-02 DIAGNOSIS — L723 Sebaceous cyst: Secondary | ICD-10-CM | POA: Diagnosis not present

## 2018-12-02 DIAGNOSIS — Z0110 Encounter for examination of ears and hearing without abnormal findings: Secondary | ICD-10-CM | POA: Diagnosis not present

## 2018-12-02 DIAGNOSIS — J449 Chronic obstructive pulmonary disease, unspecified: Secondary | ICD-10-CM | POA: Diagnosis not present

## 2018-12-02 DIAGNOSIS — Z87891 Personal history of nicotine dependence: Secondary | ICD-10-CM | POA: Diagnosis not present

## 2018-12-02 DIAGNOSIS — L089 Local infection of the skin and subcutaneous tissue, unspecified: Secondary | ICD-10-CM | POA: Diagnosis not present

## 2018-12-02 DIAGNOSIS — R42 Dizziness and giddiness: Secondary | ICD-10-CM | POA: Diagnosis not present

## 2018-12-02 DIAGNOSIS — H9311 Tinnitus, right ear: Secondary | ICD-10-CM | POA: Diagnosis not present

## 2018-12-02 NOTE — Progress Notes (Signed)
    GYNECOLOGY PROCEDURE NOTE  Janice Williams is a 57 y.o. [id number] here for incision and drainage of abscess on left labia.   Reviewed risks of incision and drainage including worsening infection, bleeding, damage to surrounding tissue and organ, inability to drain abscess, pain, possible need for further intervention. She verbalizes understanding and affirms desire to proceed. Consent signed.   Incision and drainage Procedure Patient identified, informed consent performed, consent signed. An adequate timeout was performed. The left labia was exposed and prepped with alcohol swab and then injected with 2 mL 1% lidocaine. The abscess was then prepped with betadine x3. A small incision was made in a vertical manner to good effect and exudate started to drain. A sample of this was taken and sent for culture. The abscess was drained until all pus out of area. Hemostasis obtained with small amount of pressure. She tolerated the procedure well.   Post incision instructions were given to patient. Antibiotics were sent to the pharmacy with instructions. She will follow up in 2 weeks.    Feliz Beam, M.D. Attending Center for [employer] manager)

## 2018-12-02 NOTE — Telephone Encounter (Addendum)
Patient called stating she has developed a boil on her vagina.  Patient has an appointment with Dr. Baxter Flattery today and will discuss this issue with her. Pricilla Riffle RN

## 2018-12-02 NOTE — Progress Notes (Signed)
Patient ID: Janice Williams, female   DOB: 07-Nov-1961, 57 y.o.   MRN: [id number]  HPI Cd 4 count of 910/VL 39 on biktarvy. Went to ENT about nasal congestion.finishing 7 d of cefdinir for sinus infection. She reports that a  Large boil starting to develop near genitals yesterday morning that now almost unbearable to sit. Has associated dysuria but thinks it is due to tenderness to the affected area. Has not had this before  Outpatient Encounter Medications as of 12/02/2018  Medication Sig  . albuterol (PROVENTIL HFA;VENTOLIN HFA) 108 (90 Base) MCG/ACT inhaler Inhale 2 puffs into the lungs every 6 (six) hours as needed for wheezing or shortness of breath.  [name] amLODipine (NORVASC) 10 MG tablet TAKE ONE TABLET BY MOUTH DAILY  . azelastine (OPTIVAR) 0.05 % ophthalmic solution Place 1 drop into both eyes 2 (two) times daily.  . bictegravir-emtricitabine-tenofovir AF (BIKTARVY) 50-200-25 MG TABS tablet Take 1 tablet by mouth daily.   . cefdinir (OMNICEF) 300 MG capsule Take 1 capsule (300 mg total) by mouth 2 (two) times daily for 7 days.  [name] dronabinol (MARINOL) 5 MG capsule Take 1 capsule (5 mg total) by mouth 2 (two) times daily before a meal.  . ENSURE (ENSURE) Take 237 mLs by mouth 3 (three) times daily between meals.  . fluticasone (FLONASE) 50 MCG/ACT nasal spray INSTILL 1 SPRAY INTO BOTH NOSTRILS DAILY  . hydrocortisone (ANUSOL-HC) 2.5 % rectal cream Apply rectally 2 times daily  . loratadine (CLARITIN) 10 MG tablet Take 1 tablet (10 mg total) by mouth daily.  . Olopatadine HCl (PATADAY) 0.2 % SOLN Apply 1 drop to eye daily.  [name] omeprazole (PRILOSEC) 20 MG capsule TAKE 1 CAPSULE BY MOUTH EVERY DAY  . ondansetron (ZOFRAN) 4 MG tablet Take 1 tablet (4 mg total) by mouth every 8 (eight) hours as needed for nausea or vomiting.  . polyethylene glycol (MIRALAX / GLYCOLAX) packet Take one capful in 8 oz of fluid of your choice twice a day until having soft stools, then back down to once a day.  If  not having soft stools with twice a day, increased to three times a day  . pramoxine (PROCTOFOAM) 1 % foam Place 1 application rectally 3 (three) times daily as needed.  . pravastatin (PRAVACHOL) 20 MG tablet TAKE 1 TABLET BY MOUTH EVERY DAY  . sodium chloride (OCEAN) 0.65 % SOLN nasal spray Place 2 sprays into both nostrils 3 (three) times daily.  [name] triamcinolone (NASACORT) 55 MCG/ACT AERO nasal inhaler Place 2 sprays into the nose daily.  [name] (TUCKS) 50 % PADS Use as needed for rectal itching, after having BM  . [DISCONTINUED] traMADol (ULTRAM) 50 MG tablet Take 1 tablet (50 mg total) by mouth every 12 (twelve) hours as needed.   No facility-administered encounter medications on file as of 12/02/2018.      Patient Active Problem List   Diagnosis Date Noted  . Moderate protein-calorie malnutrition ([place]) 05/31/2018  . COPD with acute exacerbation ([place]) 06/09/2017  . Hypertension 06/08/2017  . Dental abscess 03/15/2015  . Knee pain, bilateral 03/15/2015  . Acne cystica 12/22/2011  . Dyslipidemia 07/06/2011  . Routine health maintenance 07/06/2011  . EPIDERMOID CYST 02/10/2011  . Acute non-recurrent maxillary sinusitis 01/20/2010  . EXTERNAL OTITIS 01/06/2010  . Hyperlipidemia 06/03/2009  . WEIGHT LOSS 06/03/2009  . CARBUNCLE AND FURUNCLE OF FACE 02/04/2009  . FACIAL RASH 02/04/2009  . Human immunodeficiency virus (HIV) disease ([place]) 10/29/2008  . ANEMIA-NOS 10/29/2008  .  PERIPHERAL NEUROPATHY 10/29/2008  . GERD 10/29/2008  . RENAL INSUFFICIENCY 10/29/2008  . CEREBROVASCULAR ACCIDENT, HX OF 10/29/2008  . TOBACCO DEPENDENCE 02/14/2007  . CVA 02/14/2007  . RHINITIS, ALLERGIC 02/14/2007  . DYSPEPSIA 02/14/2007  . ACNE 02/14/2007  . WEIGHT LOSS, ABNORMAL 02/14/2007     Health Maintenance Due  Topic Date Due  . INFLUENZA VACCINE  07/18/2018     Review of Systems 12 point ros is negative except for pain on genitals associated with skin infection Physical Exam    BP 109/75   Pulse 98   Temp 98 F (36.7 C)   Ht 5\' 2"  (1.575 m)   Wt 99 lb (44.9 kg)   BMI 18.11 kg/m   Physical Exam  Constitutional:  oriented to person, place, and time. appears well-developed and well-nourished. No distress.  HENT: [place]/AT, PERRLA, no scleral icterus Mouth/Throat: Oropharynx is clear and moist. No oropharyngeal exudate.  Cardiovascular: Normal rate, regular rhythm and normal heart sounds. Exam reveals no gallop and no friction rub.  No murmur heard.  Pulmonary/Chest: Effort normal and breath sounds normal. No respiratory distress.  has no wheezes.  Neck = supple, no nuchal rigidity Abdominal: Soft. Bowel sounds are normal.  exhibits no distension. There is no tenderness.  gu = tender throughout mons pubis. Left labia major edematous but most noticeable is a large 1.25 inch round abscess.  Lymphadenopathy: no cervical adenopathy. No axillary adenopathy Neurological: alert and oriented to person, place, and time.  Skin: Skin is warm and dry. No rash noted. No erythema.  Psychiatric: a normal mood and affect.  behavior is normal.   Lab Results  Component Value Date   CD4TCELL 38 11/06/2018   Lab Results  Component Value Date   CD4TABS 910 11/06/2018   CD4TABS 810 06/07/2018   CD4TABS 830 02/01/2018   Lab Results  Component Value Date   HIV1RNAQUANT 39 (H) 11/06/2018   Lab Results  Component Value Date   HEPBSAB NEG 12/28/2014   Lab Results  Component Value Date   LABRPR NON-REACTIVE 11/06/2018    CBC Lab Results  Component Value Date   WBC 6.2 11/06/2018   RBC 3.55 (L) 11/06/2018   HGB 11.9 11/06/2018   HCT 34.1 (L) 11/06/2018   PLT 321 11/06/2018   MCV 96.1 11/06/2018   MCH 33.5 (H) 11/06/2018   MCHC 34.9 11/06/2018   RDW 12.5 11/06/2018   LYMPHSABS 2,213 11/06/2018   MONOABS 258 06/27/2017   EOSABS 31 11/06/2018    BMET Lab Results  Component Value Date   NA 139 11/06/2018   K 3.8 11/06/2018   CL 103 11/06/2018   CO2 26  11/06/2018   GLUCOSE 134 (H) 11/06/2018   BUN 19 11/06/2018   CREATININE 1.12 (H) 11/06/2018   CALCIUM 9.4 11/06/2018   GFRNONAA 54 (L) 02/01/2018   GFRAA 62 02/01/2018      Assessment and Plan  Labial abscess vs. Bartholin's cyst = will refer to ED for I x D, packing. Defer to treat with abtx  gu pain = will give refill for tramadol  hiv disease = well controlled

## 2018-12-02 NOTE — Progress Notes (Signed)
   Subjective:    Janice Williams - 57 y.o. female MRN [id number]  Date of birth: [birth year]  HPI  Janice Williams is a 57 y.o. female here for abscess of left labia.  She states that she started to have some pain and burning of her left labia yesterday. She has been putting heat on it and soaking in warm baths with epsom salt.  She is currently taking cefdinir, but recently completed a course of augmentin and azithromycin for sinus trouble. Despite the antibiotics and local measures, the abscess has continued to grow and become more and more painful.   OB History   No obstetric history on file.       Health Maintenance:  Health Maintenance Due  Topic Date Due  . INFLUENZA VACCINE  07/18/2018    -  reports that she quit smoking about 5 years ago. Her smoking use included cigarettes. She started smoking about 5 years ago. She has a 15.20 pack-year smoking history. She has never used smokeless tobacco. - Review of Systems: Per HPI. - Past Medical History: Patient Active Problem List   Diagnosis Date Noted  . Moderate protein-calorie malnutrition ([place]) 05/31/2018  . COPD with acute exacerbation ([place]) 06/09/2017  . Hypertension 06/08/2017  . Dental abscess 03/15/2015  . Knee pain, bilateral 03/15/2015  . Acne cystica 12/22/2011  . Dyslipidemia 07/06/2011  . Routine health maintenance 07/06/2011  . EPIDERMOID CYST 02/10/2011  . Acute non-recurrent maxillary sinusitis 01/20/2010  . EXTERNAL OTITIS 01/06/2010  . Hyperlipidemia 06/03/2009  . WEIGHT LOSS 06/03/2009  . CARBUNCLE AND FURUNCLE OF FACE 02/04/2009  . FACIAL RASH 02/04/2009  . Human immunodeficiency virus (HIV) disease ([place]) 10/29/2008  . ANEMIA-NOS 10/29/2008  . PERIPHERAL NEUROPATHY 10/29/2008  . GERD 10/29/2008  . RENAL INSUFFICIENCY 10/29/2008  . CEREBROVASCULAR ACCIDENT, HX OF 10/29/2008  . TOBACCO DEPENDENCE 02/14/2007  . CVA 02/14/2007  . RHINITIS, ALLERGIC 02/14/2007  . DYSPEPSIA 02/14/2007  . ACNE 02/14/2007   . WEIGHT LOSS, ABNORMAL 02/14/2007   - Medications: reviewed and updated   Objective:   Physical Exam BP 113/89   Pulse (!) 111   Wt 99 lb (44.9 kg)   BMI 18.11 kg/m  Gen: NAD, alert, cooperative with exam, well-appearing CV: sinus tachycardia Resp: non-labored breathing Neuro: no gross deficits.  Psych: good insight, alert and oriented GU/GYN: Exam performed in the presence of a chaperone. Left labia with ~4x2cm fluctuant mass, exquisitely tender to palpation with area of induration extending toward the mons pubis.       Assessment & Plan:   1. Left genital labial abscess - s/p incision and drainage with assistance of Dr. Rosana Hoes (see procedure note)  - will prescribe augmentin 7 day course - Anaerobic and Aerobic Culture - adjust abx per culture report - follow up in 2 weeks or PRN if no improvement  Routine preventative health maintenance measures emphasized. Please refer to After Visit Summary for other counseling recommendations.   No follow-ups on file.  Aura Camps, MD  OB Fellow  12/02/2018, 4:38 PM

## 2018-12-02 NOTE — Progress Notes (Signed)
Felt some soreness yesterday left labia and sat in warm water with epsom salt. THis am about 0230 and urinated caused burning. THis am was bigger. Have had boils under arms before but never vagina

## 2018-12-03 ENCOUNTER — Telehealth: Payer: Self-pay | Admitting: General Practice

## 2018-12-03 DIAGNOSIS — J449 Chronic obstructive pulmonary disease, unspecified: Secondary | ICD-10-CM | POA: Diagnosis not present

## 2018-12-03 DIAGNOSIS — I10 Essential (primary) hypertension: Secondary | ICD-10-CM | POA: Diagnosis not present

## 2018-12-03 DIAGNOSIS — B20 Human immunodeficiency virus [HIV] disease: Secondary | ICD-10-CM | POA: Diagnosis not present

## 2018-12-03 DIAGNOSIS — N764 Abscess of vulva: Secondary | ICD-10-CM

## 2018-12-03 MED ORDER — AMOXICILLIN-POT CLAVULANATE 875-125 MG PO TABS: 1.0000 | ORAL_TABLET | Freq: Two times a day (BID) | ORAL | 0 refills | Status: AC

## 2018-12-03 MED FILL — BIKTARVY 50-200-25 MG TABS: 50-200-25 | 30 days supply | Qty: 30 | Fill #3

## 2018-12-03 NOTE — Telephone Encounter (Signed)
Patient called and left message on nurse voicemail line stating she was supposed to have a prescription sent in to her pharmacy but it isn't there. Spoke with Dr Rosana Hoes who states patient needs Rx for Augmentin 875mg  BID x 1 week. Rx ordered. Called patient, no answer- left message stating we are trying to reach you to return your phone call. Your prescription has been sent to your pharmacy. Please call us back if you have other questions or concerns.

## 2018-12-04 DIAGNOSIS — J449 Chronic obstructive pulmonary disease, unspecified: Secondary | ICD-10-CM | POA: Diagnosis not present

## 2018-12-04 DIAGNOSIS — B20 Human immunodeficiency virus [HIV] disease: Secondary | ICD-10-CM | POA: Diagnosis not present

## 2018-12-04 DIAGNOSIS — I10 Essential (primary) hypertension: Secondary | ICD-10-CM | POA: Diagnosis not present

## 2018-12-05 DIAGNOSIS — H40033 Anatomical narrow angle, bilateral: Secondary | ICD-10-CM | POA: Diagnosis not present

## 2018-12-05 DIAGNOSIS — H16223 Keratoconjunctivitis sicca, not specified as Sjogren's, bilateral: Secondary | ICD-10-CM | POA: Diagnosis not present

## 2018-12-05 DIAGNOSIS — J449 Chronic obstructive pulmonary disease, unspecified: Secondary | ICD-10-CM | POA: Diagnosis not present

## 2018-12-05 DIAGNOSIS — I10 Essential (primary) hypertension: Secondary | ICD-10-CM | POA: Diagnosis not present

## 2018-12-05 DIAGNOSIS — B20 Human immunodeficiency virus [HIV] disease: Secondary | ICD-10-CM | POA: Diagnosis not present

## 2018-12-05 DIAGNOSIS — H5213 Myopia, bilateral: Secondary | ICD-10-CM | POA: Diagnosis not present

## 2018-12-06 DIAGNOSIS — J449 Chronic obstructive pulmonary disease, unspecified: Secondary | ICD-10-CM | POA: Diagnosis not present

## 2018-12-06 DIAGNOSIS — B20 Human immunodeficiency virus [HIV] disease: Secondary | ICD-10-CM | POA: Diagnosis not present

## 2018-12-06 DIAGNOSIS — I10 Essential (primary) hypertension: Secondary | ICD-10-CM | POA: Diagnosis not present

## 2018-12-06 LAB — ANAEROBIC AND AEROBIC CULTURE

## 2018-12-07 DIAGNOSIS — J449 Chronic obstructive pulmonary disease, unspecified: Secondary | ICD-10-CM | POA: Diagnosis not present

## 2018-12-07 DIAGNOSIS — B20 Human immunodeficiency virus [HIV] disease: Secondary | ICD-10-CM | POA: Diagnosis not present

## 2018-12-07 DIAGNOSIS — I10 Essential (primary) hypertension: Secondary | ICD-10-CM | POA: Diagnosis not present

## 2018-12-08 DIAGNOSIS — I10 Essential (primary) hypertension: Secondary | ICD-10-CM | POA: Diagnosis not present

## 2018-12-08 DIAGNOSIS — B20 Human immunodeficiency virus [HIV] disease: Secondary | ICD-10-CM | POA: Diagnosis not present

## 2018-12-08 DIAGNOSIS — J449 Chronic obstructive pulmonary disease, unspecified: Secondary | ICD-10-CM | POA: Diagnosis not present

## 2018-12-09 ENCOUNTER — Telehealth: Payer: Self-pay | Admitting: Family Medicine

## 2018-12-09 DIAGNOSIS — B20 Human immunodeficiency virus [HIV] disease: Secondary | ICD-10-CM | POA: Diagnosis not present

## 2018-12-09 DIAGNOSIS — I10 Essential (primary) hypertension: Secondary | ICD-10-CM | POA: Diagnosis not present

## 2018-12-09 DIAGNOSIS — J449 Chronic obstructive pulmonary disease, unspecified: Secondary | ICD-10-CM | POA: Diagnosis not present

## 2018-12-09 MED ORDER — CEFDINIR 300 MG PO CAPS: 300.0000 mg | ORAL_CAPSULE | Freq: Two times a day (BID) | ORAL | 0 refills | Status: DC

## 2018-12-09 NOTE — Telephone Encounter (Signed)
I called Ms. Plamondon to discuss the results of her culture. She is growing bacteroides fragilis, which is usually resistant to penicillin. I spoke to the ID specialist on call to discuss her treatment options given her history of being HIV positive. Dr. Linus Salmons recommended treating with a cephalosporin. He states that bacteriodes can also be resistant to metronidazole. Called patient to let her know that her antibiotics have been changed and that she will need to pick up the new prescription and start it as soon as possible. All questions answered. Follow up at scheduled visit on 12/24/18.   Aura Camps, MD  12/09/2018 6:04 PM

## 2018-12-10 DIAGNOSIS — I10 Essential (primary) hypertension: Secondary | ICD-10-CM | POA: Diagnosis not present

## 2018-12-10 DIAGNOSIS — B20 Human immunodeficiency virus [HIV] disease: Secondary | ICD-10-CM | POA: Diagnosis not present

## 2018-12-10 DIAGNOSIS — J449 Chronic obstructive pulmonary disease, unspecified: Secondary | ICD-10-CM | POA: Diagnosis not present

## 2018-12-11 DIAGNOSIS — I10 Essential (primary) hypertension: Secondary | ICD-10-CM | POA: Diagnosis not present

## 2018-12-11 DIAGNOSIS — J449 Chronic obstructive pulmonary disease, unspecified: Secondary | ICD-10-CM | POA: Diagnosis not present

## 2018-12-11 DIAGNOSIS — B20 Human immunodeficiency virus [HIV] disease: Secondary | ICD-10-CM | POA: Diagnosis not present

## 2018-12-12 DIAGNOSIS — B20 Human immunodeficiency virus [HIV] disease: Secondary | ICD-10-CM | POA: Diagnosis not present

## 2018-12-12 DIAGNOSIS — I10 Essential (primary) hypertension: Secondary | ICD-10-CM | POA: Diagnosis not present

## 2018-12-12 DIAGNOSIS — J449 Chronic obstructive pulmonary disease, unspecified: Secondary | ICD-10-CM | POA: Diagnosis not present

## 2018-12-13 DIAGNOSIS — J449 Chronic obstructive pulmonary disease, unspecified: Secondary | ICD-10-CM | POA: Diagnosis not present

## 2018-12-13 DIAGNOSIS — B20 Human immunodeficiency virus [HIV] disease: Secondary | ICD-10-CM | POA: Diagnosis not present

## 2018-12-13 DIAGNOSIS — I10 Essential (primary) hypertension: Secondary | ICD-10-CM | POA: Diagnosis not present

## 2018-12-14 DIAGNOSIS — J449 Chronic obstructive pulmonary disease, unspecified: Secondary | ICD-10-CM | POA: Diagnosis not present

## 2018-12-14 DIAGNOSIS — I10 Essential (primary) hypertension: Secondary | ICD-10-CM | POA: Diagnosis not present

## 2018-12-14 DIAGNOSIS — B20 Human immunodeficiency virus [HIV] disease: Secondary | ICD-10-CM | POA: Diagnosis not present

## 2018-12-15 DIAGNOSIS — I10 Essential (primary) hypertension: Secondary | ICD-10-CM | POA: Diagnosis not present

## 2018-12-15 DIAGNOSIS — B20 Human immunodeficiency virus [HIV] disease: Secondary | ICD-10-CM | POA: Diagnosis not present

## 2018-12-15 DIAGNOSIS — J449 Chronic obstructive pulmonary disease, unspecified: Secondary | ICD-10-CM | POA: Diagnosis not present

## 2018-12-16 ENCOUNTER — Ambulatory Visit (INDEPENDENT_AMBULATORY_CARE_PROVIDER_SITE_OTHER): Payer: Medicaid Other

## 2018-12-16 ENCOUNTER — Ambulatory Visit: Payer: Medicaid Other

## 2018-12-16 DIAGNOSIS — J449 Chronic obstructive pulmonary disease, unspecified: Secondary | ICD-10-CM | POA: Diagnosis not present

## 2018-12-16 DIAGNOSIS — B20 Human immunodeficiency virus [HIV] disease: Secondary | ICD-10-CM | POA: Diagnosis not present

## 2018-12-16 DIAGNOSIS — Z23 Encounter for immunization: Secondary | ICD-10-CM

## 2018-12-16 DIAGNOSIS — I10 Essential (primary) hypertension: Secondary | ICD-10-CM | POA: Diagnosis not present

## 2018-12-17 DIAGNOSIS — J449 Chronic obstructive pulmonary disease, unspecified: Secondary | ICD-10-CM | POA: Diagnosis not present

## 2018-12-17 DIAGNOSIS — B20 Human immunodeficiency virus [HIV] disease: Secondary | ICD-10-CM | POA: Diagnosis not present

## 2018-12-17 DIAGNOSIS — I10 Essential (primary) hypertension: Secondary | ICD-10-CM | POA: Diagnosis not present

## 2018-12-18 DIAGNOSIS — B20 Human immunodeficiency virus [HIV] disease: Secondary | ICD-10-CM | POA: Diagnosis not present

## 2018-12-18 DIAGNOSIS — J449 Chronic obstructive pulmonary disease, unspecified: Secondary | ICD-10-CM | POA: Diagnosis not present

## 2018-12-18 DIAGNOSIS — I10 Essential (primary) hypertension: Secondary | ICD-10-CM | POA: Diagnosis not present

## 2018-12-19 DIAGNOSIS — J449 Chronic obstructive pulmonary disease, unspecified: Secondary | ICD-10-CM | POA: Diagnosis not present

## 2018-12-19 DIAGNOSIS — I10 Essential (primary) hypertension: Secondary | ICD-10-CM | POA: Diagnosis not present

## 2018-12-19 DIAGNOSIS — B20 Human immunodeficiency virus [HIV] disease: Secondary | ICD-10-CM | POA: Diagnosis not present

## 2018-12-20 DIAGNOSIS — B20 Human immunodeficiency virus [HIV] disease: Secondary | ICD-10-CM | POA: Diagnosis not present

## 2018-12-20 DIAGNOSIS — J449 Chronic obstructive pulmonary disease, unspecified: Secondary | ICD-10-CM | POA: Diagnosis not present

## 2018-12-20 DIAGNOSIS — I10 Essential (primary) hypertension: Secondary | ICD-10-CM | POA: Diagnosis not present

## 2018-12-21 DIAGNOSIS — J449 Chronic obstructive pulmonary disease, unspecified: Secondary | ICD-10-CM | POA: Diagnosis not present

## 2018-12-21 DIAGNOSIS — I10 Essential (primary) hypertension: Secondary | ICD-10-CM | POA: Diagnosis not present

## 2018-12-21 DIAGNOSIS — B20 Human immunodeficiency virus [HIV] disease: Secondary | ICD-10-CM | POA: Diagnosis not present

## 2018-12-22 DIAGNOSIS — B20 Human immunodeficiency virus [HIV] disease: Secondary | ICD-10-CM | POA: Diagnosis not present

## 2018-12-22 DIAGNOSIS — J449 Chronic obstructive pulmonary disease, unspecified: Secondary | ICD-10-CM | POA: Diagnosis not present

## 2018-12-22 DIAGNOSIS — I10 Essential (primary) hypertension: Secondary | ICD-10-CM | POA: Diagnosis not present

## 2018-12-23 DIAGNOSIS — I10 Essential (primary) hypertension: Secondary | ICD-10-CM | POA: Diagnosis not present

## 2018-12-23 DIAGNOSIS — B20 Human immunodeficiency virus [HIV] disease: Secondary | ICD-10-CM | POA: Diagnosis not present

## 2018-12-23 DIAGNOSIS — J449 Chronic obstructive pulmonary disease, unspecified: Secondary | ICD-10-CM | POA: Diagnosis not present

## 2018-12-24 ENCOUNTER — Ambulatory Visit: Payer: Medicaid Other | Admitting: Family Medicine

## 2018-12-24 DIAGNOSIS — J449 Chronic obstructive pulmonary disease, unspecified: Secondary | ICD-10-CM | POA: Diagnosis not present

## 2018-12-24 DIAGNOSIS — I10 Essential (primary) hypertension: Secondary | ICD-10-CM | POA: Diagnosis not present

## 2018-12-24 DIAGNOSIS — B20 Human immunodeficiency virus [HIV] disease: Secondary | ICD-10-CM | POA: Diagnosis not present

## 2018-12-25 ENCOUNTER — Telehealth: Payer: Self-pay | Admitting: *Deleted

## 2018-12-25 DIAGNOSIS — I10 Essential (primary) hypertension: Secondary | ICD-10-CM | POA: Diagnosis not present

## 2018-12-25 DIAGNOSIS — B20 Human immunodeficiency virus [HIV] disease: Secondary | ICD-10-CM | POA: Diagnosis not present

## 2018-12-25 DIAGNOSIS — J449 Chronic obstructive pulmonary disease, unspecified: Secondary | ICD-10-CM | POA: Diagnosis not present

## 2018-12-25 NOTE — Telephone Encounter (Signed)
Patient has switched pharmacies from [facility] to [facility]. She states that her tramadol cannot be transferred, would like a new prescription called into [facility] on [address].  She has 1 refill available at [facility], will pick that up.  Her next fill can be sent to [facility]. Landis Gandy, RN

## 2018-12-26 DIAGNOSIS — B20 Human immunodeficiency virus [HIV] disease: Secondary | ICD-10-CM | POA: Diagnosis not present

## 2018-12-26 DIAGNOSIS — J449 Chronic obstructive pulmonary disease, unspecified: Secondary | ICD-10-CM | POA: Diagnosis not present

## 2018-12-26 DIAGNOSIS — I10 Essential (primary) hypertension: Secondary | ICD-10-CM | POA: Diagnosis not present

## 2018-12-27 DIAGNOSIS — J449 Chronic obstructive pulmonary disease, unspecified: Secondary | ICD-10-CM | POA: Diagnosis not present

## 2018-12-27 DIAGNOSIS — B20 Human immunodeficiency virus [HIV] disease: Secondary | ICD-10-CM | POA: Diagnosis not present

## 2018-12-27 DIAGNOSIS — I10 Essential (primary) hypertension: Secondary | ICD-10-CM | POA: Diagnosis not present

## 2018-12-28 DIAGNOSIS — J449 Chronic obstructive pulmonary disease, unspecified: Secondary | ICD-10-CM | POA: Diagnosis not present

## 2018-12-28 DIAGNOSIS — I10 Essential (primary) hypertension: Secondary | ICD-10-CM | POA: Diagnosis not present

## 2018-12-28 DIAGNOSIS — B20 Human immunodeficiency virus [HIV] disease: Secondary | ICD-10-CM | POA: Diagnosis not present

## 2018-12-29 DIAGNOSIS — I10 Essential (primary) hypertension: Secondary | ICD-10-CM | POA: Diagnosis not present

## 2018-12-29 DIAGNOSIS — J449 Chronic obstructive pulmonary disease, unspecified: Secondary | ICD-10-CM | POA: Diagnosis not present

## 2018-12-29 DIAGNOSIS — B20 Human immunodeficiency virus [HIV] disease: Secondary | ICD-10-CM | POA: Diagnosis not present

## 2018-12-30 DIAGNOSIS — J449 Chronic obstructive pulmonary disease, unspecified: Secondary | ICD-10-CM | POA: Diagnosis not present

## 2018-12-30 DIAGNOSIS — B20 Human immunodeficiency virus [HIV] disease: Secondary | ICD-10-CM | POA: Diagnosis not present

## 2018-12-30 DIAGNOSIS — I10 Essential (primary) hypertension: Secondary | ICD-10-CM | POA: Diagnosis not present

## 2018-12-31 DIAGNOSIS — I10 Essential (primary) hypertension: Secondary | ICD-10-CM | POA: Diagnosis not present

## 2018-12-31 DIAGNOSIS — J449 Chronic obstructive pulmonary disease, unspecified: Secondary | ICD-10-CM | POA: Diagnosis not present

## 2018-12-31 DIAGNOSIS — B20 Human immunodeficiency virus [HIV] disease: Secondary | ICD-10-CM | POA: Diagnosis not present

## 2019-01-01 ENCOUNTER — Ambulatory Visit: Payer: Medicaid Other | Admitting: Family Medicine

## 2019-01-01 DIAGNOSIS — B20 Human immunodeficiency virus [HIV] disease: Secondary | ICD-10-CM | POA: Diagnosis not present

## 2019-01-01 DIAGNOSIS — I10 Essential (primary) hypertension: Secondary | ICD-10-CM | POA: Diagnosis not present

## 2019-01-01 DIAGNOSIS — J449 Chronic obstructive pulmonary disease, unspecified: Secondary | ICD-10-CM | POA: Diagnosis not present

## 2019-01-02 DIAGNOSIS — I10 Essential (primary) hypertension: Secondary | ICD-10-CM | POA: Diagnosis not present

## 2019-01-02 DIAGNOSIS — B20 Human immunodeficiency virus [HIV] disease: Secondary | ICD-10-CM | POA: Diagnosis not present

## 2019-01-02 DIAGNOSIS — J449 Chronic obstructive pulmonary disease, unspecified: Secondary | ICD-10-CM | POA: Diagnosis not present

## 2019-01-03 DIAGNOSIS — J449 Chronic obstructive pulmonary disease, unspecified: Secondary | ICD-10-CM | POA: Diagnosis not present

## 2019-01-03 DIAGNOSIS — I10 Essential (primary) hypertension: Secondary | ICD-10-CM | POA: Diagnosis not present

## 2019-01-03 DIAGNOSIS — B20 Human immunodeficiency virus [HIV] disease: Secondary | ICD-10-CM | POA: Diagnosis not present

## 2019-01-04 DIAGNOSIS — I10 Essential (primary) hypertension: Secondary | ICD-10-CM | POA: Diagnosis not present

## 2019-01-04 DIAGNOSIS — J449 Chronic obstructive pulmonary disease, unspecified: Secondary | ICD-10-CM | POA: Diagnosis not present

## 2019-01-04 DIAGNOSIS — B20 Human immunodeficiency virus [HIV] disease: Secondary | ICD-10-CM | POA: Diagnosis not present

## 2019-01-05 DIAGNOSIS — J449 Chronic obstructive pulmonary disease, unspecified: Secondary | ICD-10-CM | POA: Diagnosis not present

## 2019-01-05 DIAGNOSIS — I10 Essential (primary) hypertension: Secondary | ICD-10-CM | POA: Diagnosis not present

## 2019-01-05 DIAGNOSIS — B20 Human immunodeficiency virus [HIV] disease: Secondary | ICD-10-CM | POA: Diagnosis not present

## 2019-01-06 DIAGNOSIS — J449 Chronic obstructive pulmonary disease, unspecified: Secondary | ICD-10-CM | POA: Diagnosis not present

## 2019-01-06 DIAGNOSIS — B20 Human immunodeficiency virus [HIV] disease: Secondary | ICD-10-CM | POA: Diagnosis not present

## 2019-01-06 DIAGNOSIS — I10 Essential (primary) hypertension: Secondary | ICD-10-CM | POA: Diagnosis not present

## 2019-01-07 ENCOUNTER — Telehealth: Payer: Self-pay | Admitting: *Deleted

## 2019-01-07 DIAGNOSIS — I10 Essential (primary) hypertension: Secondary | ICD-10-CM | POA: Diagnosis not present

## 2019-01-07 DIAGNOSIS — B20 Human immunodeficiency virus [HIV] disease: Secondary | ICD-10-CM | POA: Diagnosis not present

## 2019-01-07 DIAGNOSIS — J449 Chronic obstructive pulmonary disease, unspecified: Secondary | ICD-10-CM | POA: Diagnosis not present

## 2019-01-07 NOTE — Telephone Encounter (Signed)
Patient called to see if Dr Baxter Flattery would write a letter in support of her moving to a ground level apartment due to her osteoarthritis in her knees. She is meeting with her apartment manager on Thursday 01/09/2019 and will need this letter in support by then. Please advise. Landis Gandy, RN

## 2019-01-08 ENCOUNTER — Encounter: Payer: Self-pay | Admitting: *Deleted

## 2019-01-08 DIAGNOSIS — I10 Essential (primary) hypertension: Secondary | ICD-10-CM | POA: Diagnosis not present

## 2019-01-08 DIAGNOSIS — J449 Chronic obstructive pulmonary disease, unspecified: Secondary | ICD-10-CM | POA: Diagnosis not present

## 2019-01-08 DIAGNOSIS — B20 Human immunodeficiency virus [HIV] disease: Secondary | ICD-10-CM | POA: Diagnosis not present

## 2019-01-09 ENCOUNTER — Other Ambulatory Visit: Payer: Self-pay

## 2019-01-09 DIAGNOSIS — J449 Chronic obstructive pulmonary disease, unspecified: Secondary | ICD-10-CM | POA: Diagnosis not present

## 2019-01-09 DIAGNOSIS — I10 Essential (primary) hypertension: Secondary | ICD-10-CM | POA: Diagnosis not present

## 2019-01-09 DIAGNOSIS — B20 Human immunodeficiency virus [HIV] disease: Secondary | ICD-10-CM | POA: Diagnosis not present

## 2019-01-09 MED ORDER — AMLODIPINE BESYLATE 10 MG PO TABS: 10.0000 mg | ORAL_TABLET | Freq: Every day | ORAL | 1 refills | Status: DC

## 2019-01-10 DIAGNOSIS — I10 Essential (primary) hypertension: Secondary | ICD-10-CM | POA: Diagnosis not present

## 2019-01-10 DIAGNOSIS — B20 Human immunodeficiency virus [HIV] disease: Secondary | ICD-10-CM | POA: Diagnosis not present

## 2019-01-10 DIAGNOSIS — J449 Chronic obstructive pulmonary disease, unspecified: Secondary | ICD-10-CM | POA: Diagnosis not present

## 2019-01-11 DIAGNOSIS — I10 Essential (primary) hypertension: Secondary | ICD-10-CM | POA: Diagnosis not present

## 2019-01-11 DIAGNOSIS — B20 Human immunodeficiency virus [HIV] disease: Secondary | ICD-10-CM | POA: Diagnosis not present

## 2019-01-11 DIAGNOSIS — J449 Chronic obstructive pulmonary disease, unspecified: Secondary | ICD-10-CM | POA: Diagnosis not present

## 2019-01-12 DIAGNOSIS — J449 Chronic obstructive pulmonary disease, unspecified: Secondary | ICD-10-CM | POA: Diagnosis not present

## 2019-01-12 DIAGNOSIS — I10 Essential (primary) hypertension: Secondary | ICD-10-CM | POA: Diagnosis not present

## 2019-01-12 DIAGNOSIS — B20 Human immunodeficiency virus [HIV] disease: Secondary | ICD-10-CM | POA: Diagnosis not present

## 2019-01-13 ENCOUNTER — Other Ambulatory Visit: Payer: Self-pay

## 2019-01-13 DIAGNOSIS — I10 Essential (primary) hypertension: Secondary | ICD-10-CM

## 2019-01-13 DIAGNOSIS — B20 Human immunodeficiency virus [HIV] disease: Secondary | ICD-10-CM | POA: Diagnosis not present

## 2019-01-13 DIAGNOSIS — J449 Chronic obstructive pulmonary disease, unspecified: Secondary | ICD-10-CM | POA: Diagnosis not present

## 2019-01-13 MED ORDER — AMLODIPINE BESYLATE 10 MG PO TABS: 10.0000 mg | ORAL_TABLET | Freq: Every day | ORAL | 1 refills | Status: DC

## 2019-01-14 DIAGNOSIS — I10 Essential (primary) hypertension: Secondary | ICD-10-CM | POA: Diagnosis not present

## 2019-01-14 DIAGNOSIS — J449 Chronic obstructive pulmonary disease, unspecified: Secondary | ICD-10-CM | POA: Diagnosis not present

## 2019-01-14 DIAGNOSIS — B20 Human immunodeficiency virus [HIV] disease: Secondary | ICD-10-CM | POA: Diagnosis not present

## 2019-01-14 MED FILL — FLUTICASONE PROP 50 MCG SPR: 50 | 30 days supply | Qty: 16 | Fill #2

## 2019-01-14 MED FILL — BIKTARVY 50-200-25 MG TABS: 50-200-25 | 30 days supply | Qty: 30 | Fill #4

## 2019-01-15 DIAGNOSIS — I10 Essential (primary) hypertension: Secondary | ICD-10-CM | POA: Diagnosis not present

## 2019-01-15 DIAGNOSIS — B20 Human immunodeficiency virus [HIV] disease: Secondary | ICD-10-CM | POA: Diagnosis not present

## 2019-01-15 DIAGNOSIS — J449 Chronic obstructive pulmonary disease, unspecified: Secondary | ICD-10-CM | POA: Diagnosis not present

## 2019-01-16 DIAGNOSIS — I10 Essential (primary) hypertension: Secondary | ICD-10-CM | POA: Diagnosis not present

## 2019-01-16 DIAGNOSIS — J449 Chronic obstructive pulmonary disease, unspecified: Secondary | ICD-10-CM | POA: Diagnosis not present

## 2019-01-16 DIAGNOSIS — B20 Human immunodeficiency virus [HIV] disease: Secondary | ICD-10-CM | POA: Diagnosis not present

## 2019-01-17 DIAGNOSIS — I10 Essential (primary) hypertension: Secondary | ICD-10-CM | POA: Diagnosis not present

## 2019-01-17 DIAGNOSIS — B20 Human immunodeficiency virus [HIV] disease: Secondary | ICD-10-CM | POA: Diagnosis not present

## 2019-01-17 DIAGNOSIS — J449 Chronic obstructive pulmonary disease, unspecified: Secondary | ICD-10-CM | POA: Diagnosis not present

## 2019-01-18 DIAGNOSIS — J449 Chronic obstructive pulmonary disease, unspecified: Secondary | ICD-10-CM | POA: Diagnosis not present

## 2019-01-18 DIAGNOSIS — I10 Essential (primary) hypertension: Secondary | ICD-10-CM | POA: Diagnosis not present

## 2019-01-18 DIAGNOSIS — B20 Human immunodeficiency virus [HIV] disease: Secondary | ICD-10-CM | POA: Diagnosis not present

## 2019-01-19 DIAGNOSIS — I10 Essential (primary) hypertension: Secondary | ICD-10-CM | POA: Diagnosis not present

## 2019-01-19 DIAGNOSIS — J449 Chronic obstructive pulmonary disease, unspecified: Secondary | ICD-10-CM | POA: Diagnosis not present

## 2019-01-19 DIAGNOSIS — B20 Human immunodeficiency virus [HIV] disease: Secondary | ICD-10-CM | POA: Diagnosis not present

## 2019-01-20 DIAGNOSIS — I10 Essential (primary) hypertension: Secondary | ICD-10-CM | POA: Diagnosis not present

## 2019-01-20 DIAGNOSIS — B20 Human immunodeficiency virus [HIV] disease: Secondary | ICD-10-CM | POA: Diagnosis not present

## 2019-01-20 DIAGNOSIS — J449 Chronic obstructive pulmonary disease, unspecified: Secondary | ICD-10-CM | POA: Diagnosis not present

## 2019-01-21 DIAGNOSIS — B20 Human immunodeficiency virus [HIV] disease: Secondary | ICD-10-CM | POA: Diagnosis not present

## 2019-01-21 DIAGNOSIS — I10 Essential (primary) hypertension: Secondary | ICD-10-CM | POA: Diagnosis not present

## 2019-01-21 DIAGNOSIS — J449 Chronic obstructive pulmonary disease, unspecified: Secondary | ICD-10-CM | POA: Diagnosis not present

## 2019-01-22 DIAGNOSIS — J449 Chronic obstructive pulmonary disease, unspecified: Secondary | ICD-10-CM | POA: Diagnosis not present

## 2019-01-22 DIAGNOSIS — I10 Essential (primary) hypertension: Secondary | ICD-10-CM | POA: Diagnosis not present

## 2019-01-22 DIAGNOSIS — B20 Human immunodeficiency virus [HIV] disease: Secondary | ICD-10-CM | POA: Diagnosis not present

## 2019-01-23 DIAGNOSIS — J449 Chronic obstructive pulmonary disease, unspecified: Secondary | ICD-10-CM | POA: Diagnosis not present

## 2019-01-23 DIAGNOSIS — I10 Essential (primary) hypertension: Secondary | ICD-10-CM | POA: Diagnosis not present

## 2019-01-23 DIAGNOSIS — B20 Human immunodeficiency virus [HIV] disease: Secondary | ICD-10-CM | POA: Diagnosis not present

## 2019-01-24 DIAGNOSIS — J449 Chronic obstructive pulmonary disease, unspecified: Secondary | ICD-10-CM | POA: Diagnosis not present

## 2019-01-24 DIAGNOSIS — I10 Essential (primary) hypertension: Secondary | ICD-10-CM | POA: Diagnosis not present

## 2019-01-24 DIAGNOSIS — B20 Human immunodeficiency virus [HIV] disease: Secondary | ICD-10-CM | POA: Diagnosis not present

## 2019-01-25 DIAGNOSIS — B20 Human immunodeficiency virus [HIV] disease: Secondary | ICD-10-CM | POA: Diagnosis not present

## 2019-01-25 DIAGNOSIS — J449 Chronic obstructive pulmonary disease, unspecified: Secondary | ICD-10-CM | POA: Diagnosis not present

## 2019-01-25 DIAGNOSIS — I10 Essential (primary) hypertension: Secondary | ICD-10-CM | POA: Diagnosis not present

## 2019-01-26 DIAGNOSIS — B20 Human immunodeficiency virus [HIV] disease: Secondary | ICD-10-CM | POA: Diagnosis not present

## 2019-01-26 DIAGNOSIS — I10 Essential (primary) hypertension: Secondary | ICD-10-CM | POA: Diagnosis not present

## 2019-01-26 DIAGNOSIS — J449 Chronic obstructive pulmonary disease, unspecified: Secondary | ICD-10-CM | POA: Diagnosis not present

## 2019-01-27 DIAGNOSIS — B20 Human immunodeficiency virus [HIV] disease: Secondary | ICD-10-CM | POA: Diagnosis not present

## 2019-01-27 DIAGNOSIS — I10 Essential (primary) hypertension: Secondary | ICD-10-CM | POA: Diagnosis not present

## 2019-01-27 DIAGNOSIS — J449 Chronic obstructive pulmonary disease, unspecified: Secondary | ICD-10-CM | POA: Diagnosis not present

## 2019-01-28 ENCOUNTER — Other Ambulatory Visit: Payer: Self-pay

## 2019-01-28 DIAGNOSIS — I10 Essential (primary) hypertension: Secondary | ICD-10-CM | POA: Diagnosis not present

## 2019-01-28 DIAGNOSIS — B20 Human immunodeficiency virus [HIV] disease: Secondary | ICD-10-CM | POA: Diagnosis not present

## 2019-01-28 DIAGNOSIS — R634 Abnormal weight loss: Secondary | ICD-10-CM

## 2019-01-28 DIAGNOSIS — J449 Chronic obstructive pulmonary disease, unspecified: Secondary | ICD-10-CM | POA: Diagnosis not present

## 2019-01-28 MED ORDER — ENSURE PO LIQD: 237.0000 mL | Freq: Three times a day (TID) | ORAL | 5 refills | Status: DC

## 2019-01-29 ENCOUNTER — Telehealth: Payer: Self-pay

## 2019-01-29 DIAGNOSIS — M1612 Unilateral primary osteoarthritis, left hip: Secondary | ICD-10-CM

## 2019-01-29 DIAGNOSIS — J449 Chronic obstructive pulmonary disease, unspecified: Secondary | ICD-10-CM | POA: Diagnosis not present

## 2019-01-29 DIAGNOSIS — I10 Essential (primary) hypertension: Secondary | ICD-10-CM | POA: Diagnosis not present

## 2019-01-29 DIAGNOSIS — B20 Human immunodeficiency virus [HIV] disease: Secondary | ICD-10-CM | POA: Diagnosis not present

## 2019-01-29 MED ORDER — TRAMADOL HCL 50 MG PO TABS: 50.0000 mg | ORAL_TABLET | Freq: Two times a day (BID) | ORAL | 0 refills | Status: DC | PRN

## 2019-01-29 NOTE — Telephone Encounter (Signed)
Patient walked into clinic today stating she needs a new prescription for Tramadol 50 mg sent into [facility] on [address]. Patient states she lost her purse last week which had her prescription in it. Will route message to Dr. Baxter Flattery to advise if it is okay to fill prescription. [place]

## 2019-01-29 NOTE — Telephone Encounter (Signed)
Yes. Please refill

## 2019-01-29 NOTE — Telephone Encounter (Signed)
Called [facility] on [address] with verbal order to dispense Tramadol 50 mg 0 refills. Pharmacist was able to take call for prescription.  Informed patient that medication should be ready later today to pick up.  [place]

## 2019-01-30 DIAGNOSIS — J449 Chronic obstructive pulmonary disease, unspecified: Secondary | ICD-10-CM | POA: Diagnosis not present

## 2019-01-30 DIAGNOSIS — B20 Human immunodeficiency virus [HIV] disease: Secondary | ICD-10-CM | POA: Diagnosis not present

## 2019-01-30 DIAGNOSIS — I10 Essential (primary) hypertension: Secondary | ICD-10-CM | POA: Diagnosis not present

## 2019-01-31 DIAGNOSIS — J449 Chronic obstructive pulmonary disease, unspecified: Secondary | ICD-10-CM | POA: Diagnosis not present

## 2019-01-31 DIAGNOSIS — B20 Human immunodeficiency virus [HIV] disease: Secondary | ICD-10-CM | POA: Diagnosis not present

## 2019-01-31 DIAGNOSIS — I10 Essential (primary) hypertension: Secondary | ICD-10-CM | POA: Diagnosis not present

## 2019-02-01 DIAGNOSIS — B20 Human immunodeficiency virus [HIV] disease: Secondary | ICD-10-CM | POA: Diagnosis not present

## 2019-02-01 DIAGNOSIS — J449 Chronic obstructive pulmonary disease, unspecified: Secondary | ICD-10-CM | POA: Diagnosis not present

## 2019-02-01 DIAGNOSIS — I10 Essential (primary) hypertension: Secondary | ICD-10-CM | POA: Diagnosis not present

## 2019-02-02 DIAGNOSIS — J449 Chronic obstructive pulmonary disease, unspecified: Secondary | ICD-10-CM | POA: Diagnosis not present

## 2019-02-02 DIAGNOSIS — I10 Essential (primary) hypertension: Secondary | ICD-10-CM | POA: Diagnosis not present

## 2019-02-02 DIAGNOSIS — B20 Human immunodeficiency virus [HIV] disease: Secondary | ICD-10-CM | POA: Diagnosis not present

## 2019-02-03 DIAGNOSIS — I10 Essential (primary) hypertension: Secondary | ICD-10-CM | POA: Diagnosis not present

## 2019-02-03 DIAGNOSIS — J449 Chronic obstructive pulmonary disease, unspecified: Secondary | ICD-10-CM | POA: Diagnosis not present

## 2019-02-03 DIAGNOSIS — B20 Human immunodeficiency virus [HIV] disease: Secondary | ICD-10-CM | POA: Diagnosis not present

## 2019-02-04 DIAGNOSIS — J449 Chronic obstructive pulmonary disease, unspecified: Secondary | ICD-10-CM | POA: Diagnosis not present

## 2019-02-04 DIAGNOSIS — I10 Essential (primary) hypertension: Secondary | ICD-10-CM | POA: Diagnosis not present

## 2019-02-04 DIAGNOSIS — B20 Human immunodeficiency virus [HIV] disease: Secondary | ICD-10-CM | POA: Diagnosis not present

## 2019-02-05 DIAGNOSIS — J449 Chronic obstructive pulmonary disease, unspecified: Secondary | ICD-10-CM | POA: Diagnosis not present

## 2019-02-05 DIAGNOSIS — I10 Essential (primary) hypertension: Secondary | ICD-10-CM | POA: Diagnosis not present

## 2019-02-05 DIAGNOSIS — B20 Human immunodeficiency virus [HIV] disease: Secondary | ICD-10-CM | POA: Diagnosis not present

## 2019-02-06 DIAGNOSIS — B20 Human immunodeficiency virus [HIV] disease: Secondary | ICD-10-CM | POA: Diagnosis not present

## 2019-02-06 DIAGNOSIS — J449 Chronic obstructive pulmonary disease, unspecified: Secondary | ICD-10-CM | POA: Diagnosis not present

## 2019-02-06 DIAGNOSIS — I10 Essential (primary) hypertension: Secondary | ICD-10-CM | POA: Diagnosis not present

## 2019-02-06 MED FILL — BIKTARVY 50-200-25 MG TABS: 50-200-25 | 30 days supply | Qty: 30 | Fill #5

## 2019-02-07 DIAGNOSIS — J449 Chronic obstructive pulmonary disease, unspecified: Secondary | ICD-10-CM | POA: Diagnosis not present

## 2019-02-07 DIAGNOSIS — I10 Essential (primary) hypertension: Secondary | ICD-10-CM | POA: Diagnosis not present

## 2019-02-07 DIAGNOSIS — B20 Human immunodeficiency virus [HIV] disease: Secondary | ICD-10-CM | POA: Diagnosis not present

## 2019-02-08 DIAGNOSIS — J449 Chronic obstructive pulmonary disease, unspecified: Secondary | ICD-10-CM | POA: Diagnosis not present

## 2019-02-08 DIAGNOSIS — B20 Human immunodeficiency virus [HIV] disease: Secondary | ICD-10-CM | POA: Diagnosis not present

## 2019-02-08 DIAGNOSIS — I10 Essential (primary) hypertension: Secondary | ICD-10-CM | POA: Diagnosis not present

## 2019-02-09 DIAGNOSIS — I10 Essential (primary) hypertension: Secondary | ICD-10-CM | POA: Diagnosis not present

## 2019-02-09 DIAGNOSIS — J449 Chronic obstructive pulmonary disease, unspecified: Secondary | ICD-10-CM | POA: Diagnosis not present

## 2019-02-09 DIAGNOSIS — B20 Human immunodeficiency virus [HIV] disease: Secondary | ICD-10-CM | POA: Diagnosis not present

## 2019-02-10 ENCOUNTER — Other Ambulatory Visit: Payer: Self-pay | Admitting: *Deleted

## 2019-02-10 DIAGNOSIS — J449 Chronic obstructive pulmonary disease, unspecified: Secondary | ICD-10-CM | POA: Diagnosis not present

## 2019-02-10 DIAGNOSIS — R110 Nausea: Secondary | ICD-10-CM

## 2019-02-10 DIAGNOSIS — I10 Essential (primary) hypertension: Secondary | ICD-10-CM | POA: Diagnosis not present

## 2019-02-10 DIAGNOSIS — B20 Human immunodeficiency virus [HIV] disease: Secondary | ICD-10-CM | POA: Diagnosis not present

## 2019-02-10 MED ORDER — ONDANSETRON HCL 4 MG PO TABS: 4.0000 mg | ORAL_TABLET | Freq: Three times a day (TID) | ORAL | 0 refills | Status: DC | PRN

## 2019-02-11 DIAGNOSIS — I10 Essential (primary) hypertension: Secondary | ICD-10-CM | POA: Diagnosis not present

## 2019-02-11 DIAGNOSIS — B20 Human immunodeficiency virus [HIV] disease: Secondary | ICD-10-CM | POA: Diagnosis not present

## 2019-02-11 DIAGNOSIS — J449 Chronic obstructive pulmonary disease, unspecified: Secondary | ICD-10-CM | POA: Diagnosis not present

## 2019-02-12 DIAGNOSIS — J449 Chronic obstructive pulmonary disease, unspecified: Secondary | ICD-10-CM | POA: Diagnosis not present

## 2019-02-12 DIAGNOSIS — I10 Essential (primary) hypertension: Secondary | ICD-10-CM | POA: Diagnosis not present

## 2019-02-12 DIAGNOSIS — B20 Human immunodeficiency virus [HIV] disease: Secondary | ICD-10-CM | POA: Diagnosis not present

## 2019-02-13 DIAGNOSIS — J449 Chronic obstructive pulmonary disease, unspecified: Secondary | ICD-10-CM | POA: Diagnosis not present

## 2019-02-13 DIAGNOSIS — I10 Essential (primary) hypertension: Secondary | ICD-10-CM | POA: Diagnosis not present

## 2019-02-13 DIAGNOSIS — B20 Human immunodeficiency virus [HIV] disease: Secondary | ICD-10-CM | POA: Diagnosis not present

## 2019-02-14 DIAGNOSIS — I10 Essential (primary) hypertension: Secondary | ICD-10-CM | POA: Diagnosis not present

## 2019-02-14 DIAGNOSIS — B20 Human immunodeficiency virus [HIV] disease: Secondary | ICD-10-CM | POA: Diagnosis not present

## 2019-02-14 DIAGNOSIS — J449 Chronic obstructive pulmonary disease, unspecified: Secondary | ICD-10-CM | POA: Diagnosis not present

## 2019-02-15 DIAGNOSIS — J449 Chronic obstructive pulmonary disease, unspecified: Secondary | ICD-10-CM | POA: Diagnosis not present

## 2019-02-15 DIAGNOSIS — I10 Essential (primary) hypertension: Secondary | ICD-10-CM | POA: Diagnosis not present

## 2019-02-15 DIAGNOSIS — B20 Human immunodeficiency virus [HIV] disease: Secondary | ICD-10-CM | POA: Diagnosis not present

## 2019-02-16 DIAGNOSIS — J449 Chronic obstructive pulmonary disease, unspecified: Secondary | ICD-10-CM | POA: Diagnosis not present

## 2019-02-16 DIAGNOSIS — B20 Human immunodeficiency virus [HIV] disease: Secondary | ICD-10-CM | POA: Diagnosis not present

## 2019-02-16 DIAGNOSIS — I10 Essential (primary) hypertension: Secondary | ICD-10-CM | POA: Diagnosis not present

## 2019-02-17 DIAGNOSIS — J449 Chronic obstructive pulmonary disease, unspecified: Secondary | ICD-10-CM | POA: Diagnosis not present

## 2019-02-17 DIAGNOSIS — B20 Human immunodeficiency virus [HIV] disease: Secondary | ICD-10-CM | POA: Diagnosis not present

## 2019-02-17 DIAGNOSIS — I10 Essential (primary) hypertension: Secondary | ICD-10-CM | POA: Diagnosis not present

## 2019-02-18 DIAGNOSIS — J449 Chronic obstructive pulmonary disease, unspecified: Secondary | ICD-10-CM | POA: Diagnosis not present

## 2019-02-18 DIAGNOSIS — I10 Essential (primary) hypertension: Secondary | ICD-10-CM | POA: Diagnosis not present

## 2019-02-18 DIAGNOSIS — B20 Human immunodeficiency virus [HIV] disease: Secondary | ICD-10-CM | POA: Diagnosis not present

## 2019-02-19 DIAGNOSIS — B20 Human immunodeficiency virus [HIV] disease: Secondary | ICD-10-CM | POA: Diagnosis not present

## 2019-02-19 DIAGNOSIS — J449 Chronic obstructive pulmonary disease, unspecified: Secondary | ICD-10-CM | POA: Diagnosis not present

## 2019-02-19 DIAGNOSIS — I10 Essential (primary) hypertension: Secondary | ICD-10-CM | POA: Diagnosis not present

## 2019-02-20 DIAGNOSIS — I10 Essential (primary) hypertension: Secondary | ICD-10-CM | POA: Diagnosis not present

## 2019-02-20 DIAGNOSIS — J449 Chronic obstructive pulmonary disease, unspecified: Secondary | ICD-10-CM | POA: Diagnosis not present

## 2019-02-20 DIAGNOSIS — B20 Human immunodeficiency virus [HIV] disease: Secondary | ICD-10-CM | POA: Diagnosis not present

## 2019-02-21 DIAGNOSIS — J449 Chronic obstructive pulmonary disease, unspecified: Secondary | ICD-10-CM | POA: Diagnosis not present

## 2019-02-21 DIAGNOSIS — B20 Human immunodeficiency virus [HIV] disease: Secondary | ICD-10-CM | POA: Diagnosis not present

## 2019-02-21 DIAGNOSIS — I10 Essential (primary) hypertension: Secondary | ICD-10-CM | POA: Diagnosis not present

## 2019-02-22 DIAGNOSIS — I10 Essential (primary) hypertension: Secondary | ICD-10-CM | POA: Diagnosis not present

## 2019-02-22 DIAGNOSIS — J449 Chronic obstructive pulmonary disease, unspecified: Secondary | ICD-10-CM | POA: Diagnosis not present

## 2019-02-22 DIAGNOSIS — B20 Human immunodeficiency virus [HIV] disease: Secondary | ICD-10-CM | POA: Diagnosis not present

## 2019-02-23 DIAGNOSIS — I10 Essential (primary) hypertension: Secondary | ICD-10-CM | POA: Diagnosis not present

## 2019-02-23 DIAGNOSIS — B20 Human immunodeficiency virus [HIV] disease: Secondary | ICD-10-CM | POA: Diagnosis not present

## 2019-02-23 DIAGNOSIS — J449 Chronic obstructive pulmonary disease, unspecified: Secondary | ICD-10-CM | POA: Diagnosis not present

## 2019-02-24 DIAGNOSIS — J449 Chronic obstructive pulmonary disease, unspecified: Secondary | ICD-10-CM | POA: Diagnosis not present

## 2019-02-24 DIAGNOSIS — B20 Human immunodeficiency virus [HIV] disease: Secondary | ICD-10-CM | POA: Diagnosis not present

## 2019-02-24 DIAGNOSIS — I10 Essential (primary) hypertension: Secondary | ICD-10-CM | POA: Diagnosis not present

## 2019-02-25 ENCOUNTER — Other Ambulatory Visit: Payer: Self-pay | Admitting: Internal Medicine

## 2019-02-25 DIAGNOSIS — I10 Essential (primary) hypertension: Secondary | ICD-10-CM | POA: Diagnosis not present

## 2019-02-25 DIAGNOSIS — K2100 Gastro-esophageal reflux disease with esophagitis, without bleeding: Secondary | ICD-10-CM

## 2019-02-25 DIAGNOSIS — J449 Chronic obstructive pulmonary disease, unspecified: Secondary | ICD-10-CM | POA: Diagnosis not present

## 2019-02-25 DIAGNOSIS — B20 Human immunodeficiency virus [HIV] disease: Secondary | ICD-10-CM | POA: Diagnosis not present

## 2019-02-25 DIAGNOSIS — E785 Hyperlipidemia, unspecified: Secondary | ICD-10-CM

## 2019-02-26 ENCOUNTER — Other Ambulatory Visit: Payer: Self-pay

## 2019-02-26 ENCOUNTER — Telehealth: Payer: Self-pay

## 2019-02-26 ENCOUNTER — Other Ambulatory Visit: Payer: Self-pay | Admitting: Internal Medicine

## 2019-02-26 DIAGNOSIS — I10 Essential (primary) hypertension: Secondary | ICD-10-CM | POA: Diagnosis not present

## 2019-02-26 DIAGNOSIS — B20 Human immunodeficiency virus [HIV] disease: Secondary | ICD-10-CM | POA: Diagnosis not present

## 2019-02-26 DIAGNOSIS — M1612 Unilateral primary osteoarthritis, left hip: Secondary | ICD-10-CM

## 2019-02-26 DIAGNOSIS — J449 Chronic obstructive pulmonary disease, unspecified: Secondary | ICD-10-CM | POA: Diagnosis not present

## 2019-02-26 MED ORDER — TRAMADOL HCL 50 MG PO TABS: 50.0000 mg | ORAL_TABLET | Freq: Two times a day (BID) | ORAL | 0 refills | Status: DC | PRN

## 2019-02-26 MED ORDER — ALBUTEROL SULFATE HFA 108 (90 BASE) MCG/ACT IN AERS: 2.0000 | INHALATION_SPRAY | Freq: Four times a day (QID) | RESPIRATORY_TRACT | 5 refills | Status: DC | PRN

## 2019-02-26 MED FILL — FLUTICASONE PROP 50 MCG SPR: 50 | 30 days supply | Qty: 16 | Fill #0

## 2019-02-26 NOTE — Progress Notes (Signed)
Called patient's tramadol in to [facility] on [address]. Pharmacy was able to take a verbal order over phone, will call office if they have any questions. [place]

## 2019-02-26 NOTE — Telephone Encounter (Signed)
Patient called requesting a refill for Tramdol one day early due to patient needing to travel to [facility] today due to family emergency. Routing to MD for advise.  Janice Mcalpine, LPN

## 2019-02-27 ENCOUNTER — Other Ambulatory Visit: Payer: Self-pay

## 2019-02-27 DIAGNOSIS — J449 Chronic obstructive pulmonary disease, unspecified: Secondary | ICD-10-CM | POA: Diagnosis not present

## 2019-02-27 DIAGNOSIS — B20 Human immunodeficiency virus [HIV] disease: Secondary | ICD-10-CM | POA: Diagnosis not present

## 2019-02-27 DIAGNOSIS — I10 Essential (primary) hypertension: Secondary | ICD-10-CM | POA: Diagnosis not present

## 2019-02-27 DIAGNOSIS — J441 Chronic obstructive pulmonary disease with (acute) exacerbation: Secondary | ICD-10-CM

## 2019-02-27 MED ORDER — ALBUTEROL SULFATE HFA 108 (90 BASE) MCG/ACT IN AERS: 2.0000 | INHALATION_SPRAY | Freq: Four times a day (QID) | RESPIRATORY_TRACT | 5 refills | Status: DC | PRN

## 2019-02-28 DIAGNOSIS — J449 Chronic obstructive pulmonary disease, unspecified: Secondary | ICD-10-CM | POA: Diagnosis not present

## 2019-02-28 DIAGNOSIS — B20 Human immunodeficiency virus [HIV] disease: Secondary | ICD-10-CM | POA: Diagnosis not present

## 2019-02-28 DIAGNOSIS — I10 Essential (primary) hypertension: Secondary | ICD-10-CM | POA: Diagnosis not present

## 2019-03-01 DIAGNOSIS — J449 Chronic obstructive pulmonary disease, unspecified: Secondary | ICD-10-CM | POA: Diagnosis not present

## 2019-03-01 DIAGNOSIS — I10 Essential (primary) hypertension: Secondary | ICD-10-CM | POA: Diagnosis not present

## 2019-03-01 DIAGNOSIS — B20 Human immunodeficiency virus [HIV] disease: Secondary | ICD-10-CM | POA: Diagnosis not present

## 2019-03-02 DIAGNOSIS — I10 Essential (primary) hypertension: Secondary | ICD-10-CM | POA: Diagnosis not present

## 2019-03-02 DIAGNOSIS — J449 Chronic obstructive pulmonary disease, unspecified: Secondary | ICD-10-CM | POA: Diagnosis not present

## 2019-03-02 DIAGNOSIS — B20 Human immunodeficiency virus [HIV] disease: Secondary | ICD-10-CM | POA: Diagnosis not present

## 2019-03-03 ENCOUNTER — Other Ambulatory Visit: Payer: Self-pay | Admitting: *Deleted

## 2019-03-03 DIAGNOSIS — J449 Chronic obstructive pulmonary disease, unspecified: Secondary | ICD-10-CM | POA: Diagnosis not present

## 2019-03-03 DIAGNOSIS — I10 Essential (primary) hypertension: Secondary | ICD-10-CM | POA: Diagnosis not present

## 2019-03-03 DIAGNOSIS — B20 Human immunodeficiency virus [HIV] disease: Secondary | ICD-10-CM | POA: Diagnosis not present

## 2019-03-04 DIAGNOSIS — J449 Chronic obstructive pulmonary disease, unspecified: Secondary | ICD-10-CM | POA: Diagnosis not present

## 2019-03-04 DIAGNOSIS — B20 Human immunodeficiency virus [HIV] disease: Secondary | ICD-10-CM | POA: Diagnosis not present

## 2019-03-04 DIAGNOSIS — I10 Essential (primary) hypertension: Secondary | ICD-10-CM | POA: Diagnosis not present

## 2019-03-05 DIAGNOSIS — J449 Chronic obstructive pulmonary disease, unspecified: Secondary | ICD-10-CM | POA: Diagnosis not present

## 2019-03-05 DIAGNOSIS — I10 Essential (primary) hypertension: Secondary | ICD-10-CM | POA: Diagnosis not present

## 2019-03-05 DIAGNOSIS — B20 Human immunodeficiency virus [HIV] disease: Secondary | ICD-10-CM | POA: Diagnosis not present

## 2019-03-06 ENCOUNTER — Other Ambulatory Visit: Payer: Self-pay

## 2019-03-06 ENCOUNTER — Other Ambulatory Visit: Payer: Medicaid Other

## 2019-03-06 DIAGNOSIS — B20 Human immunodeficiency virus [HIV] disease: Secondary | ICD-10-CM | POA: Diagnosis not present

## 2019-03-06 DIAGNOSIS — J449 Chronic obstructive pulmonary disease, unspecified: Secondary | ICD-10-CM | POA: Diagnosis not present

## 2019-03-06 DIAGNOSIS — I10 Essential (primary) hypertension: Secondary | ICD-10-CM | POA: Diagnosis not present

## 2019-03-07 DIAGNOSIS — J449 Chronic obstructive pulmonary disease, unspecified: Secondary | ICD-10-CM | POA: Diagnosis not present

## 2019-03-07 DIAGNOSIS — B20 Human immunodeficiency virus [HIV] disease: Secondary | ICD-10-CM | POA: Diagnosis not present

## 2019-03-07 DIAGNOSIS — I10 Essential (primary) hypertension: Secondary | ICD-10-CM | POA: Diagnosis not present

## 2019-03-07 LAB — T-HELPER CELL (CD4) - (RCID CLINIC ONLY)
CD4 % Helper T Cell: 40 % (ref 33–55)
CD4 T Cell Abs: 850 /uL (ref 400–2700)

## 2019-03-07 MED FILL — BIKTARVY 50-200-25 MG TABS: 50-200-25 | 30 days supply | Qty: 30 | Fill #6

## 2019-03-08 DIAGNOSIS — J449 Chronic obstructive pulmonary disease, unspecified: Secondary | ICD-10-CM | POA: Diagnosis not present

## 2019-03-08 DIAGNOSIS — B20 Human immunodeficiency virus [HIV] disease: Secondary | ICD-10-CM | POA: Diagnosis not present

## 2019-03-08 DIAGNOSIS — I10 Essential (primary) hypertension: Secondary | ICD-10-CM | POA: Diagnosis not present

## 2019-03-09 DIAGNOSIS — I10 Essential (primary) hypertension: Secondary | ICD-10-CM | POA: Diagnosis not present

## 2019-03-09 DIAGNOSIS — B20 Human immunodeficiency virus [HIV] disease: Secondary | ICD-10-CM | POA: Diagnosis not present

## 2019-03-09 DIAGNOSIS — J449 Chronic obstructive pulmonary disease, unspecified: Secondary | ICD-10-CM | POA: Diagnosis not present

## 2019-03-10 ENCOUNTER — Telehealth: Payer: Self-pay | Admitting: *Deleted

## 2019-03-10 DIAGNOSIS — J449 Chronic obstructive pulmonary disease, unspecified: Secondary | ICD-10-CM | POA: Diagnosis not present

## 2019-03-10 DIAGNOSIS — I10 Essential (primary) hypertension: Secondary | ICD-10-CM | POA: Diagnosis not present

## 2019-03-10 DIAGNOSIS — B20 Human immunodeficiency virus [HIV] disease: Secondary | ICD-10-CM | POA: Diagnosis not present

## 2019-03-10 MED ORDER — LORATADINE 10 MG PO TABS: 10.0000 mg | ORAL_TABLET | Freq: Every day | ORAL | 2 refills | Status: DC

## 2019-03-10 NOTE — Telephone Encounter (Signed)
Patient requesting refill of allergy medication (loratadine), written at hospital discharge in 2018. She does not have primary care to refill this.  Will send her information for primary care connections, as she is insured. Please advise on temporary refill of loratadine. Landis Gandy, RN

## 2019-03-10 NOTE — Addendum Note (Signed)
Addended by: Mauricio Po D on: 03/10/2019 11:42 AM   Modules accepted: Orders

## 2019-03-10 NOTE — Telephone Encounter (Signed)
Medication sent to pharmacy  

## 2019-03-11 DIAGNOSIS — J449 Chronic obstructive pulmonary disease, unspecified: Secondary | ICD-10-CM | POA: Diagnosis not present

## 2019-03-11 DIAGNOSIS — I10 Essential (primary) hypertension: Secondary | ICD-10-CM | POA: Diagnosis not present

## 2019-03-11 DIAGNOSIS — B20 Human immunodeficiency virus [HIV] disease: Secondary | ICD-10-CM | POA: Diagnosis not present

## 2019-03-11 LAB — CBC WITH DIFFERENTIAL/PLATELET
Absolute Monocytes: 372 cells/uL (ref 200–950)
Basophils Absolute: 12 cells/uL (ref 0–200)
Basophils Relative: 0.3 %
Eosinophils Absolute: 20 cells/uL (ref 15–500)
Eosinophils Relative: 0.5 %
HCT: 34.4 % — ABNORMAL LOW (ref 35.0–45.0)
Hemoglobin: 12 g/dL (ref 11.7–15.5)
Lymphs Abs: 2152 cells/uL (ref 850–3900)
MCH: 33 pg (ref 27.0–33.0)
MCHC: 34.9 g/dL (ref 32.0–36.0)
MCV: 94.5 fL (ref 80.0–100.0)
MPV: 9.9 fL (ref 7.5–12.5)
Monocytes Relative: 9.3 %
Neutro Abs: 1444 cells/uL — ABNORMAL LOW (ref 1500–7800)
Neutrophils Relative %: 36.1 %
Platelets: 315 10*3/uL (ref 140–400)
RBC: 3.64 10*6/uL — ABNORMAL LOW (ref 3.80–5.10)
RDW: 11.7 % (ref 11.0–15.0)
Total Lymphocyte: 53.8 %
WBC: 4 10*3/uL (ref 3.8–10.8)

## 2019-03-11 LAB — COMPLETE METABOLIC PANEL WITH GFR
AG Ratio: 1.6 (calc) (ref 1.0–2.5)
ALT: 7 U/L (ref 6–29)
AST: 18 U/L (ref 10–35)
Albumin: 4.3 g/dL (ref 3.6–5.1)
Alkaline phosphatase (APISO): 58 U/L (ref 37–153)
BUN: 17 mg/dL (ref 7–25)
CO2: 32 mmol/L (ref 20–32)
Calcium: 10 mg/dL (ref 8.6–10.4)
Chloride: 104 mmol/L (ref 98–110)
Creat: 1.04 mg/dL (ref 0.50–1.05)
GFR, Est African American: 69 mL/min/{1.73_m2} (ref >=60)
GFR, Est Non African American: 59 mL/min/{1.73_m2} — ABNORMAL LOW (ref >=60)
Globulin: 2.7 g/dL (calc) (ref 1.9–3.7)
Glucose, Bld: 99 mg/dL (ref 65–99)
Potassium: 5.1 mmol/L (ref 3.5–5.3)
Sodium: 146 mmol/L (ref 135–146)
Total Bilirubin: 0.4 mg/dL (ref 0.2–1.2)
Total Protein: 7 g/dL (ref 6.1–8.1)

## 2019-03-11 LAB — HIV-1 RNA QUANT-NO REFLEX-BLD
HIV 1 RNA Quant: <20 copies/mL — AB
HIV-1 RNA Quant, Log: <1.3 Log copies/mL — AB

## 2019-03-12 DIAGNOSIS — B20 Human immunodeficiency virus [HIV] disease: Secondary | ICD-10-CM | POA: Diagnosis not present

## 2019-03-12 DIAGNOSIS — I10 Essential (primary) hypertension: Secondary | ICD-10-CM | POA: Diagnosis not present

## 2019-03-12 DIAGNOSIS — J449 Chronic obstructive pulmonary disease, unspecified: Secondary | ICD-10-CM | POA: Diagnosis not present

## 2019-03-13 DIAGNOSIS — B20 Human immunodeficiency virus [HIV] disease: Secondary | ICD-10-CM | POA: Diagnosis not present

## 2019-03-13 DIAGNOSIS — I10 Essential (primary) hypertension: Secondary | ICD-10-CM | POA: Diagnosis not present

## 2019-03-13 DIAGNOSIS — J449 Chronic obstructive pulmonary disease, unspecified: Secondary | ICD-10-CM | POA: Diagnosis not present

## 2019-03-14 DIAGNOSIS — J449 Chronic obstructive pulmonary disease, unspecified: Secondary | ICD-10-CM | POA: Diagnosis not present

## 2019-03-14 DIAGNOSIS — I10 Essential (primary) hypertension: Secondary | ICD-10-CM | POA: Diagnosis not present

## 2019-03-14 DIAGNOSIS — B20 Human immunodeficiency virus [HIV] disease: Secondary | ICD-10-CM | POA: Diagnosis not present

## 2019-03-15 DIAGNOSIS — J449 Chronic obstructive pulmonary disease, unspecified: Secondary | ICD-10-CM | POA: Diagnosis not present

## 2019-03-15 DIAGNOSIS — I10 Essential (primary) hypertension: Secondary | ICD-10-CM | POA: Diagnosis not present

## 2019-03-15 DIAGNOSIS — B20 Human immunodeficiency virus [HIV] disease: Secondary | ICD-10-CM | POA: Diagnosis not present

## 2019-03-16 DIAGNOSIS — I10 Essential (primary) hypertension: Secondary | ICD-10-CM | POA: Diagnosis not present

## 2019-03-16 DIAGNOSIS — B20 Human immunodeficiency virus [HIV] disease: Secondary | ICD-10-CM | POA: Diagnosis not present

## 2019-03-16 DIAGNOSIS — J449 Chronic obstructive pulmonary disease, unspecified: Secondary | ICD-10-CM | POA: Diagnosis not present

## 2019-03-17 DIAGNOSIS — J449 Chronic obstructive pulmonary disease, unspecified: Secondary | ICD-10-CM | POA: Diagnosis not present

## 2019-03-17 DIAGNOSIS — I10 Essential (primary) hypertension: Secondary | ICD-10-CM | POA: Diagnosis not present

## 2019-03-17 DIAGNOSIS — B20 Human immunodeficiency virus [HIV] disease: Secondary | ICD-10-CM | POA: Diagnosis not present

## 2019-03-18 DIAGNOSIS — J449 Chronic obstructive pulmonary disease, unspecified: Secondary | ICD-10-CM | POA: Diagnosis not present

## 2019-03-18 DIAGNOSIS — I10 Essential (primary) hypertension: Secondary | ICD-10-CM | POA: Diagnosis not present

## 2019-03-18 DIAGNOSIS — B20 Human immunodeficiency virus [HIV] disease: Secondary | ICD-10-CM | POA: Diagnosis not present

## 2019-03-19 DIAGNOSIS — B20 Human immunodeficiency virus [HIV] disease: Secondary | ICD-10-CM | POA: Diagnosis not present

## 2019-03-19 DIAGNOSIS — I10 Essential (primary) hypertension: Secondary | ICD-10-CM | POA: Diagnosis not present

## 2019-03-19 DIAGNOSIS — J449 Chronic obstructive pulmonary disease, unspecified: Secondary | ICD-10-CM | POA: Diagnosis not present

## 2019-03-20 ENCOUNTER — Ambulatory Visit: Payer: Medicaid Other | Admitting: Family

## 2019-03-20 DIAGNOSIS — J449 Chronic obstructive pulmonary disease, unspecified: Secondary | ICD-10-CM | POA: Diagnosis not present

## 2019-03-20 DIAGNOSIS — B20 Human immunodeficiency virus [HIV] disease: Secondary | ICD-10-CM | POA: Diagnosis not present

## 2019-03-20 DIAGNOSIS — I10 Essential (primary) hypertension: Secondary | ICD-10-CM | POA: Diagnosis not present

## 2019-03-21 ENCOUNTER — Telehealth: Payer: Self-pay | Admitting: *Deleted

## 2019-03-21 DIAGNOSIS — B20 Human immunodeficiency virus [HIV] disease: Secondary | ICD-10-CM | POA: Diagnosis not present

## 2019-03-21 DIAGNOSIS — J449 Chronic obstructive pulmonary disease, unspecified: Secondary | ICD-10-CM | POA: Diagnosis not present

## 2019-03-21 DIAGNOSIS — I10 Essential (primary) hypertension: Secondary | ICD-10-CM | POA: Diagnosis not present

## 2019-03-21 NOTE — Telephone Encounter (Signed)
Patient called to report that she has been having tightness in her chest. She advised her inhaler is not working like it used to to help clear it up. She also advised she is taking her claritin daily. She wants something to help her get the mucus out of her chest. Advised her will ask her provider and get back to her as soon as I can.

## 2019-03-22 DIAGNOSIS — I10 Essential (primary) hypertension: Secondary | ICD-10-CM | POA: Diagnosis not present

## 2019-03-22 DIAGNOSIS — J449 Chronic obstructive pulmonary disease, unspecified: Secondary | ICD-10-CM | POA: Diagnosis not present

## 2019-03-22 DIAGNOSIS — B20 Human immunodeficiency virus [HIV] disease: Secondary | ICD-10-CM | POA: Diagnosis not present

## 2019-03-23 DIAGNOSIS — B20 Human immunodeficiency virus [HIV] disease: Secondary | ICD-10-CM | POA: Diagnosis not present

## 2019-03-23 DIAGNOSIS — J449 Chronic obstructive pulmonary disease, unspecified: Secondary | ICD-10-CM | POA: Diagnosis not present

## 2019-03-23 DIAGNOSIS — I10 Essential (primary) hypertension: Secondary | ICD-10-CM | POA: Diagnosis not present

## 2019-03-24 ENCOUNTER — Telehealth: Payer: Self-pay | Admitting: Behavioral Health

## 2019-03-24 DIAGNOSIS — J449 Chronic obstructive pulmonary disease, unspecified: Secondary | ICD-10-CM | POA: Diagnosis not present

## 2019-03-24 DIAGNOSIS — B20 Human immunodeficiency virus [HIV] disease: Secondary | ICD-10-CM | POA: Diagnosis not present

## 2019-03-24 DIAGNOSIS — I10 Essential (primary) hypertension: Secondary | ICD-10-CM | POA: Diagnosis not present

## 2019-03-24 NOTE — Telephone Encounter (Signed)
I would prefer to discuss over telephone visit to better screen her considering all that is going on as of now.   Please set up telephone visit tomorrow 03/25/19. For now advise to increase her fluid intake and Mucinex in the mean time and monitor for further signs and symptoms. Tylenol for headaches/fever should she experience this.   Thank you kindly.

## 2019-03-24 NOTE — Telephone Encounter (Signed)
Called patient back.  Informed her per Colletta Maryland, continue Mucinex and increase fluid intake and monitor for further symptoms.  Informed her is she has SOB or difficulty breathing call 911.  She states, "I feel fine but I can tell my Bronchitis is about to flare up."  She denies fever, denies nausea and states she has used her inhaler a few times a well.  Set her up with an Evisit with Colletta Maryland NP tomorrow. Pricilla Riffle RN

## 2019-03-24 NOTE — Telephone Encounter (Signed)
Patient called this AM stating she has a sinus infection and her Bronchitis is flaring up.  She states last time Colletta Maryland called her in prednisone and and antibiotic.  Lucyann states he has a persistent productive cough (phlegm yellow), ears hurting, and pressure in sinuses.  She states she is going to increase her fluid intake and also try Mucinex in the mean time.  Informed her Colletta Maryland and Dr. Baxter Flattery would be made aware and give her a call back if there are any updates.  She states she uses the [facility] on [address] as her pharmacy. Pricilla Riffle RN

## 2019-03-25 ENCOUNTER — Other Ambulatory Visit: Payer: Self-pay | Admitting: Infectious Diseases

## 2019-03-25 ENCOUNTER — Other Ambulatory Visit: Payer: Self-pay

## 2019-03-25 ENCOUNTER — Ambulatory Visit (INDEPENDENT_AMBULATORY_CARE_PROVIDER_SITE_OTHER): Payer: Medicaid Other | Admitting: Infectious Diseases

## 2019-03-25 ENCOUNTER — Encounter: Payer: Self-pay | Admitting: Infectious Diseases

## 2019-03-25 DIAGNOSIS — J441 Chronic obstructive pulmonary disease with (acute) exacerbation: Secondary | ICD-10-CM

## 2019-03-25 DIAGNOSIS — J301 Allergic rhinitis due to pollen: Secondary | ICD-10-CM

## 2019-03-25 DIAGNOSIS — B20 Human immunodeficiency virus [HIV] disease: Secondary | ICD-10-CM | POA: Diagnosis not present

## 2019-03-25 DIAGNOSIS — J449 Chronic obstructive pulmonary disease, unspecified: Secondary | ICD-10-CM | POA: Diagnosis not present

## 2019-03-25 DIAGNOSIS — I10 Essential (primary) hypertension: Secondary | ICD-10-CM

## 2019-03-25 MED ORDER — PREDNISONE 10 MG (21) PO TBPK: ORAL_TABLET | ORAL | 0 refills | Status: DC

## 2019-03-25 MED ORDER — MONTELUKAST SODIUM 10 MG PO TABS: 10.0000 mg | ORAL_TABLET | Freq: Every day | ORAL | 0 refills | Status: DC

## 2019-03-25 MED ORDER — BUDESONIDE-FORMOTEROL FUMARATE 80-4.5 MCG/ACT IN AERO: 2.0000 | INHALATION_SPRAY | Freq: Two times a day (BID) | RESPIRATORY_TRACT | 0 refills | Status: DC

## 2019-03-25 NOTE — Assessment & Plan Note (Signed)
I will start her on Singulair nightly.  I told her she can continue her loratadine once a day.  I also asked her to continue using her nasal sprays as this takes regular use to help.  Continue saline rinses to remove allergens in the nasal passages.

## 2019-03-25 NOTE — Progress Notes (Signed)
Name: Janice Williams NWG:[id number]  DOB: 04/10/1961  PCP: No primary care provider on file.   Virtual Visit via Telephone Note  I connected with Janice Williams on 03/25/19 at 10:30 AM EDT by telephone and verified that I am speaking with the correct person using two identifiers.   I discussed the limitations, risks, security and privacy concerns of performing an evaluation and management service by telephone and the availability of in person appointments. I also discussed with the patient that there may be a patient responsible charge related to this service. The patient expressed understanding and agreed to proceed.   Chief Complaint  Patient presents with  . Follow-up    B20     History of Present Illness: Janice Williams is a 58 y.o. with well controlled HIV on Biktarvy daily.  [name] called the office yesterday to report a bronchitis flare.  Have a history of COPD.  She does not currently smoke.  She does not have a pulmonologist or PCP to help manage.  She says her symptoms started 48 hours ago.  She reports nasal congestion, rhinorrhea, head fullness, ear itching, eye drainage, cough, yellow phlegm.  Is also noticed that she has developed some chest pain due to coughing over the last 12 hours.  She is not having any fevers or chills.  She is eating normally and energy level is normal and does not feel fatigued.  She started taking Mucinex yesterday which has helped break up some secretions but she cannot quite get it up.  She is using her albuterol inhaler several times a day for cough.  She is pushing fluids more aggressively and using a saline/humidified inhaler which she finds helpful.  Medical/surgical/social/family history have been updated during today's visit.    Observations/Objective: Janice Williams sounds congested on the phone today.  She is coughing sporadically during our visit.  HIV 1 RNA Quant (copies/mL)  Date Value  03/06/2019 <20 DETECTED (A)  11/06/2018 39 (H)   06/07/2018 62 (H)   CD4 T Cell Abs (/uL)  Date Value  03/06/2019 850  11/06/2018 910  06/07/2018 810    Lab Results  Component Value Date   CREATININE 1.04 03/06/2019   CREATININE 1.12 (H) 11/06/2018   CREATININE 1.11 (H) 06/07/2018    Lab Results  Component Value Date   WBC 4.0 03/06/2019   HGB 12.0 03/06/2019   HCT 34.4 (L) 03/06/2019   MCV 94.5 03/06/2019   PLT 315 03/06/2019    Lab Results  Component Value Date   ALT 7 03/06/2019   AST 18 03/06/2019   ALKPHOS 47 06/27/2017   BILITOT 0.4 03/06/2019     Assessment and Plan: Problem List Items Addressed This Visit      Unprioritized   Human immunodeficiency virus (HIV) disease ([place])   Allergic rhinitis    I will start her on Singulair nightly.  I told her she can continue her loratadine once a day.  I also asked her to continue using her nasal sprays as this takes regular use to help.  Continue saline rinses to remove allergens in the nasal passages.      Hypertension    Would avoid traditional decongestants with her hypertension.  Wire several medications to control this.      COPD with acute exacerbation ([place]) - Primary    Flare seems to be triggered by allergies.  I will treat with prednisone taper.  She does not seem to have any evidence of superinfection currently, I  have advised for her to send an update next week if she is feeling worse or develops any more severe symptoms or fever.  I have referred her to pulmonology as she has had 3 flares this year and does not have any maintenance inhalers.  We will start her on Symbicort 2 puffs twice a day.  I explained to her the difference between maintenance steroid inhaler and rescue inhaler.  She can continue to use albuterol if needed for cough or chest tightness or wheezing.  I discussed the need to rinse her mouth with water after Symbicort use to prevent oral thrush.      Relevant Medications   predniSONE (STERAPRED UNI-PAK 21 TAB) 10 MG (21) TBPK tablet    budesonide-formoterol (SYMBICORT) 80-4.5 MCG/ACT inhaler   montelukast (SINGULAIR) 10 MG tablet   Other Relevant Orders   Ambulatory referral to Pulmonology       Follow Up Instructions: As needed until next scheduled appointment.    I discussed the assessment and treatment plan with the patient. The patient was provided an opportunity to ask questions and all were answered. The patient agreed with the plan and demonstrated an understanding of the instructions.   The patient was advised to call back or seek an in-person evaluation if the symptoms worsen or if the condition fails to improve as anticipated.  I provided 20 minutes of non-face-to-face time during this encounter.   [name], MSN, NP-C [facility] [email or web address] Pager: [phone] Office: [phone] [place]: [phone]

## 2019-03-25 NOTE — Assessment & Plan Note (Signed)
Would avoid traditional decongestants with her hypertension.  Wire several medications to control this.

## 2019-03-25 NOTE — Patient Instructions (Signed)
For your Allergies: 1.  Continue your loratadine once a day in the mornings.  2.  Start Montelukast (Singulair) once a day at night.  3.  Continue your nasal sprays--this takes consistent use of give at least 1 more week. 4.  Continue saline rinses in your nose as well  For your bronchitis/COPD: 1.  Take prednisone taper as we discussed.  Day 1 and day to take 4 pills once a day in the morning with food.  Day 3 and 4 take 3 pills once a day in the morning with food.  Day 5 and 6 take 2 pills once a day in the morning with food.  Then take 1 pill once a day until gone. 2.  You can continue using your inhaler as needed for cough, wheezing, shortness of breath. 3.  I want a start you on a new inhaler called Symbicort.  This is 2 puffs twice a day.  I want you to use this regularly.  This is not an as needed inhaler.  This is to be taken scheduled as directed above.  Please rinse her mouth with water after use to prevent thrush. 4.  I placed a referral to get you seen by pulmonology (lung doctors) to help make recommendations about inhalers that you might find beneficial to control this condition.   Please take in lots of fluids, rest and monitor your symptoms.  If you should develop any worsening or more severe symptoms, fever, generally feeling unwell with these recommendations please call our office to update me.

## 2019-03-25 NOTE — Progress Notes (Signed)
Patient reports "bad allergies" with lots of phlegm and congestion and running nose. Eugenia Mcalpine, LPN

## 2019-03-25 NOTE — Assessment & Plan Note (Addendum)
Flare seems to be triggered by allergies.  I will treat with prednisone taper.  She does not seem to have any evidence of superinfection currently, I have advised for her to send an update next week if she is feeling worse or develops any more severe symptoms or fever.  I have referred her to pulmonology as she has had 3 flares this year and does not have any maintenance inhalers.  We will start her on Symbicort 2 puffs twice a day.  I explained to her the difference between maintenance steroid inhaler and rescue inhaler.  She can continue to use albuterol if needed for cough or chest tightness or wheezing.  I discussed the need to rinse her mouth with water after Symbicort use to prevent oral thrush.

## 2019-03-26 ENCOUNTER — Other Ambulatory Visit: Payer: Self-pay | Admitting: Internal Medicine

## 2019-03-26 DIAGNOSIS — K2100 Gastro-esophageal reflux disease with esophagitis, without bleeding: Secondary | ICD-10-CM

## 2019-03-26 DIAGNOSIS — E785 Hyperlipidemia, unspecified: Secondary | ICD-10-CM

## 2019-03-26 DIAGNOSIS — B20 Human immunodeficiency virus [HIV] disease: Secondary | ICD-10-CM | POA: Diagnosis not present

## 2019-03-26 DIAGNOSIS — I10 Essential (primary) hypertension: Secondary | ICD-10-CM | POA: Diagnosis not present

## 2019-03-26 DIAGNOSIS — J449 Chronic obstructive pulmonary disease, unspecified: Secondary | ICD-10-CM | POA: Diagnosis not present

## 2019-03-27 ENCOUNTER — Other Ambulatory Visit: Payer: Self-pay | Admitting: Internal Medicine

## 2019-03-27 DIAGNOSIS — I10 Essential (primary) hypertension: Secondary | ICD-10-CM | POA: Diagnosis not present

## 2019-03-27 DIAGNOSIS — M1612 Unilateral primary osteoarthritis, left hip: Secondary | ICD-10-CM

## 2019-03-27 DIAGNOSIS — J449 Chronic obstructive pulmonary disease, unspecified: Secondary | ICD-10-CM | POA: Diagnosis not present

## 2019-03-27 DIAGNOSIS — B20 Human immunodeficiency virus [HIV] disease: Secondary | ICD-10-CM | POA: Diagnosis not present

## 2019-03-27 MED FILL — FLUTICASONE PROP 50 MCG SPR: 50 | 30 days supply | Qty: 16 | Fill #1

## 2019-03-27 NOTE — Telephone Encounter (Signed)
Patient called today to report that she feels a little better.  She wanted to ask Colletta Maryland if she had any suggestions for the bilateral ear pain in pressure that she has in her ears.  Informed her Colletta Maryland would be notified and will call her back.  She states she is able blow her nose, and has been taking Mucinex, Symbicort, singular, and prednisone that was prescribed. Pricilla Riffle RN

## 2019-03-27 NOTE — Telephone Encounter (Signed)
Glad she starting to feel little better. It is likely that she is having pressure in her ears because of fluid buildup in her sinuses. I would give the prednisone a little more time to work.   If she having any fevers?  If her ear pain continues or worsens into next week or she develops fevers, drainage from her ears please have her give [place] a call back or send a my chart update next week.  Thank you

## 2019-03-27 NOTE — Telephone Encounter (Signed)
Called [place] [place], Informed her Colletta Maryland is glad she is feeling a little better.  The pressure in her ears is likely due to fluid built up behind sinuses.  Give the Prednisone more time to work.  Patient denies fevers.  Let her know that if her ear pain worsens to give [place] a cal next weeks or send a my Chart message.  Patient verbalized understanding Pricilla Riffle RN

## 2019-03-28 DIAGNOSIS — I10 Essential (primary) hypertension: Secondary | ICD-10-CM | POA: Diagnosis not present

## 2019-03-28 DIAGNOSIS — B20 Human immunodeficiency virus [HIV] disease: Secondary | ICD-10-CM | POA: Diagnosis not present

## 2019-03-28 DIAGNOSIS — J449 Chronic obstructive pulmonary disease, unspecified: Secondary | ICD-10-CM | POA: Diagnosis not present

## 2019-03-29 ENCOUNTER — Encounter (HOSPITAL_COMMUNITY): Payer: Self-pay | Admitting: *Deleted

## 2019-03-29 ENCOUNTER — Ambulatory Visit (HOSPITAL_COMMUNITY)
Admission: EM | Admit: 2019-03-29 | Discharge: 2019-03-29 | Disposition: A | Discharge status: Home / Self Care | Payer: Medicaid Other | Attending: Family Medicine | Admitting: Family Medicine

## 2019-03-29 ENCOUNTER — Other Ambulatory Visit: Payer: Self-pay

## 2019-03-29 DIAGNOSIS — I10 Essential (primary) hypertension: Secondary | ICD-10-CM | POA: Diagnosis not present

## 2019-03-29 DIAGNOSIS — B20 Human immunodeficiency virus [HIV] disease: Secondary | ICD-10-CM | POA: Diagnosis not present

## 2019-03-29 DIAGNOSIS — J0100 Acute maxillary sinusitis, unspecified: Secondary | ICD-10-CM | POA: Diagnosis not present

## 2019-03-29 DIAGNOSIS — B9789 Other viral agents as the cause of diseases classified elsewhere: Secondary | ICD-10-CM

## 2019-03-29 DIAGNOSIS — J069 Acute upper respiratory infection, unspecified: Secondary | ICD-10-CM

## 2019-03-29 DIAGNOSIS — J449 Chronic obstructive pulmonary disease, unspecified: Secondary | ICD-10-CM | POA: Diagnosis not present

## 2019-03-29 MED ORDER — BENZONATATE 100 MG PO CAPS: 100.0000 mg | ORAL_CAPSULE | Freq: Three times a day (TID) | ORAL | 0 refills | Status: DC

## 2019-03-29 MED ORDER — AMOXICILLIN-POT CLAVULANATE 875-125 MG PO TABS: 1.0000 | ORAL_TABLET | Freq: Two times a day (BID) | ORAL | 0 refills | Status: AC

## 2019-03-29 MED ORDER — FLUTICASONE PROPIONATE 50 MCG/ACT NA SUSP: 2.0000 | Freq: Every day | NASAL | 0 refills | Status: DC

## 2019-03-29 NOTE — ED Triage Notes (Signed)
Had e-visit with PCP office 4 days ago; has been placed on prednisone and Mucinex for bronchitis.  States feels she is getting a little worse - "my ears are full, my sinuses are draining a bitter taste".  Denies fevers.

## 2019-03-29 NOTE — Discharge Instructions (Addendum)
Rest and push fluids Continue with prescribed medications as directed.   Augmentin prescribed for potential sinus infection.  Take as directed and to completion Tessalon perles prescribed for cough Flonase prescribed for nasal congestion.   Continue with OTC ibuprofen/tylenol as needed for pain Follow up with PCP next week for recheck and to ensure your symptoms are improving You were not tested for COVID today You should remain isolated in your home for 7 days from symptom onset AND greater than 72 hours after symptoms resolution (absence of fever without the use of fever-reducing medication and improvement in respiratory symptoms), whichever is longer Take OTC tylenol as needed for fever, body aches, and/or chills Call or go to the ED if you have any new or worsening symptoms such as fever, worsening cough, shortness of breath, chest tightness, chest pain, turning blue, changes in mental status, etc..[name]

## 2019-03-29 NOTE — ED Provider Notes (Addendum)
[facility]   [id number] 03/29/19 Arrival Time: 9417   CC: URI symptoms   SUBJECTIVE: History from: patient.  Janice Williams is a 58 y.o. female hx significant for COPD, GERD, HIV, HTN, stroke, and substance abuse, who presents with nasal congestion, runny nose, PND, sinus pain/ pressure, and mild productive cough x 4 days.  Denies sick exposure to COVID, flu or strep.  Denies recent travel.  Had an e-visit with PCP and treated for bronchitis and allergies with prednisone, montelukast, and inhaler. States sinus symptoms have worsened since then.  Reports previous symptoms in the past related to sinus infection.   Denies fever, chills, fatigue, SOB, wheezing, chest pain, nausea, changes in bowel or bladder habits.    ROS: As per HPI.  Past Medical History:  Diagnosis Date  . Allergy   . Anemia   . Arthritis    lower back, knees  . Bronchitis   . COPD (chronic obstructive pulmonary disease) ([place])   . Former smoker    quit 2014  . GERD (gastroesophageal reflux disease)   . HIV infection ([place])   . Hypertension   . Stroke ([place])   . 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 SVD (spontaneous vaginal delivery)    x 3   Past Surgical History:  Procedure Laterality Date  . MULTIPLE TOOTH EXTRACTIONS     with sedation  . TUBAL LIGATION    . UPPER GI ENDOSCOPY     normal per patient - "years ago"   Allergies  Allergen Reactions  . Chantix [Varenicline] Itching   No current facility-administered medications on file prior to encounter.    Current Outpatient Medications on File Prior to Encounter  Medication Sig Dispense Refill  . albuterol (PROVENTIL HFA;VENTOLIN HFA) 108 (90 Base) MCG/ACT inhaler Inhale 2 puffs into the lungs every 6 (six) hours as needed for wheezing or shortness of breath. 1 Inhaler 5  . amLODipine (NORVASC) 10 MG tablet Take 1 tablet (10 mg total) by mouth daily. 30 tablet 1  . azelastine (OPTIVAR) 0.05 % ophthalmic solution Place 1 drop  into both eyes 2 (two) times daily. 6 mL 1  . BIKTARVY 50-200-25 MG TABS tablet TAKE 1 TABLET BY MOUTH DAILY. 30 tablet 0  . budesonide-formoterol (SYMBICORT) 80-4.5 MCG/ACT inhaler Inhale 2 puffs into the lungs 2 (two) times daily. 1 Inhaler 0  . dronabinol (MARINOL) 5 MG capsule Take 1 capsule (5 mg total) by mouth 2 (two) times daily before a meal. 60 capsule 3  . Emollient (ROC RETINOL CORREXION EX) Apply topically.    [name] loratadine (CLARITIN) 10 MG tablet Take 1 tablet (10 mg total) by mouth daily. 30 tablet 2  . montelukast (SINGULAIR) 10 MG tablet TAKE 1 TABLET(10 MG) BY MOUTH AT BEDTIME 90 tablet 1  . pravastatin (PRAVACHOL) 20 MG tablet TAKE 1 TABLET BY MOUTH EVERY DAY 30 tablet 5  . ENSURE (ENSURE) Take 237 mLs by mouth 3 (three) times daily between meals. 237 mL 5  . hydrocortisone (ANUSOL-HC) 2.5 % rectal cream Apply rectally 2 times daily 28.35 g 0  . omeprazole (PRILOSEC) 20 MG capsule TAKE 1 CAPSULE BY MOUTH EVERY DAY 30 capsule 5  . ondansetron (ZOFRAN) 4 MG tablet Take 1 tablet (4 mg total) by mouth every 8 (eight) hours as needed for nausea or vomiting. 20 tablet 0  . polyethylene glycol (MIRALAX / GLYCOLAX) packet Take one capful in 8 oz of fluid of your choice twice a  day until having soft stools, then back down to once a day.  If not having soft stools with twice a day, increased to three times a day 100 each 0  . pramoxine (PROCTOFOAM) 1 % foam Place 1 application rectally 3 (three) times daily as needed. 15 g 3  . predniSONE (STERAPRED UNI-PAK 21 TAB) 10 MG (21) TBPK tablet Day 1 and 2 - take 4 pills in the morning. Day 3-4 take 3 pills, Day 5-6 take 2 pills, then take 1 pill once a day until gone 21 tablet 0  . sodium chloride (OCEAN) 0.65 % SOLN nasal spray Place 2 sprays into both nostrils 3 (three) times daily. 1 Bottle 0  . traMADol (ULTRAM) 50 MG tablet Take 1 tablet (50 mg total) by mouth every 12 (twelve) hours as needed. for pain 60 tablet 0  . Witch Hazel (TUCKS) 50 %  PADS Use as needed for rectal itching, after having BM 40 each 0   Social History   Socioeconomic History  . Marital status: Single    Spouse name: Not on file  . Number of children: 3  . Years of education: Not on file  . Highest education level: Not on file  Occupational History  . Not on file  Social Needs  . Financial resource strain: Not on file  . Food insecurity:    Worry: Not on file    Inability: Not on file  . Transportation needs:    Medical: Not on file    Non-medical: Not on file  Tobacco Use  . Smoking status: Former Smoker    Packs/day: 0.40    Years: 38.00    Pack years: 15.20    Types: Cigarettes    Start date: 12/18/2012    Last attempt to quit: 11/21/2013    Years since quitting: 5.3  . Smokeless tobacco: Never Used  Substance and Sexual Activity  . Alcohol use: Yes    Comment: occasionally  . Drug use: No    Comment: Hx - clean for 7 years  . Sexual activity: Not on file  Lifestyle  . Physical activity:    Days per week: Not on file    Minutes per session: Not on file  . Stress: Not on file  Relationships  . Social connections:    Talks on phone: Not on file    Gets together: Not on file    Attends religious service: Not on file    Active member of club or organization: Not on file    Attends meetings of clubs or organizations: Not on file    Relationship status: Not on file  . Intimate partner violence:    Fear of current or ex partner: Not on file    Emotionally abused: Not on file    Physically abused: Not on file    Forced sexual activity: Not on file  Other Topics Concern  . Not on file  Social History Narrative  . Not on file   Family History  Problem Relation Age of Onset  . Hypertension Mother   . Hypertension Father   . Hyperlipidemia Father   . Colon cancer Neg Hx   . Rectal cancer Neg Hx   . Stomach cancer Neg Hx     OBJECTIVE:  Vitals:   03/29/19 1743  BP: (!) 180/94  Pulse: 73  Resp: 20  Temp: 98.5 F (36.9 C)   TempSrc: Oral  SpO2: 98%     General appearance: alert; appears mildly  ill, nontoxic; speaking in full sentences and tolerating own secretions HEENT: NCAT; Ears: EACs clear, TMs pearly gray; Eyes: PERRL.  EOM grossly intact. Sinuses: TTP over maxillary sinuses; Nose: nares patent without rhinorrhea, crusting evident in nares, Throat: oropharynx clear, dentures present, tonsils non erythematous or enlarged, uvula midline  Neck: supple without LAD Lungs: unlabored respirations, symmetrical air entry; cough: mild; no respiratory distress; CTAB Heart: regular rate and rhythm.  Radial pulses 2+ symmetrical bilaterally Skin: warm and dry Psychological: alert and cooperative; normal mood and affect  ASSESSMENT & PLAN:  1. Acute non-recurrent maxillary sinusitis   2. Viral URI with cough     Meds ordered this encounter  Medications  . amoxicillin-clavulanate (AUGMENTIN) 875-125 MG tablet    Sig: Take 1 tablet by mouth every 12 (twelve) hours for 10 days.    Dispense:  20 tablet    Refill:  0    Order Specific Question:   Supervising Provider    Answer:   Raylene Everts [[id number]]  . fluticasone (FLONASE) 50 MCG/ACT nasal spray    Sig: Place 2 sprays into both nostrils daily.    Dispense:  16 g    Refill:  0    Order Specific Question:   Supervising Provider    Answer:   Raylene Everts [[id number]]  . benzonatate (TESSALON) 100 MG capsule    Sig: Take 1 capsule (100 mg total) by mouth every 8 (eight) hours.    Dispense:  21 capsule    Refill:  0    Order Specific Question:   Supervising Provider    Answer:   Raylene Everts [[id number]]   Rest and push fluids Continue with prescribed medications as directed.   Augmentin prescribed for potential sinus infection.  Take as directed and to completion Tessalon perles prescribed for cough Flonase prescribed for nasal congestion.   Continue with OTC ibuprofen/tylenol as needed for pain Follow up with PCP next week for recheck and to  ensure your symptoms are improving You were not tested for COVID today You should remain isolated in your home for 7 days from symptom onset AND greater than 72 hours after symptoms resolution (absence of fever without the use of fever-reducing medication and improvement in respiratory symptoms), whichever is longer Take OTC tylenol as needed for fever, body aches, and/or chills Call or go to the ED if you have any new or worsening symptoms such as fever, worsening cough, shortness of breath, chest tightness, chest pain, turning blue, changes in mental status, etc...  Reviewed expectations re: course of current medical issues. Questions answered. Outlined signs and symptoms indicating need for more acute intervention. Patient verbalized understanding. After Visit Summary given.         Lestine Box, PA-C 03/29/19 Lacey, [place], PA-C 03/29/19 1817

## 2019-03-30 DIAGNOSIS — B20 Human immunodeficiency virus [HIV] disease: Secondary | ICD-10-CM | POA: Diagnosis not present

## 2019-03-30 DIAGNOSIS — I10 Essential (primary) hypertension: Secondary | ICD-10-CM | POA: Diagnosis not present

## 2019-03-30 DIAGNOSIS — J449 Chronic obstructive pulmonary disease, unspecified: Secondary | ICD-10-CM | POA: Diagnosis not present

## 2019-03-31 ENCOUNTER — Encounter: Payer: Self-pay | Admitting: Behavioral Health

## 2019-03-31 ENCOUNTER — Telehealth: Payer: Self-pay | Admitting: Behavioral Health

## 2019-03-31 ENCOUNTER — Other Ambulatory Visit: Payer: Self-pay | Admitting: Behavioral Health

## 2019-03-31 DIAGNOSIS — M1612 Unilateral primary osteoarthritis, left hip: Secondary | ICD-10-CM

## 2019-03-31 DIAGNOSIS — I10 Essential (primary) hypertension: Secondary | ICD-10-CM | POA: Diagnosis not present

## 2019-03-31 DIAGNOSIS — B20 Human immunodeficiency virus [HIV] disease: Secondary | ICD-10-CM | POA: Diagnosis not present

## 2019-03-31 DIAGNOSIS — J449 Chronic obstructive pulmonary disease, unspecified: Secondary | ICD-10-CM | POA: Diagnosis not present

## 2019-03-31 MED ORDER — TRAMADOL HCL 50 MG PO TABS: 50.0000 mg | ORAL_TABLET | Freq: Two times a day (BID) | ORAL | 0 refills | Status: DC | PRN

## 2019-03-31 MED FILL — BIKTARVY 50-200-25 MG TABS: 50-200-25 | 30 days supply | Qty: 30 | Fill #0

## 2019-03-31 NOTE — Telephone Encounter (Addendum)
Patient called requesting Tramadol refill.  Attempted to call refill in however it needs to be electronically prescribed by provider.  Dr, Baxter Flattery not available at this time. Will send to [name] to see if it can be e-scribed to [facility] [address]. Pricilla Riffle RN

## 2019-03-31 NOTE — Telephone Encounter (Signed)
error 

## 2019-03-31 NOTE — Telephone Encounter (Signed)
[facility] reviewed - no abnormalities.  Will refill for Dr. Baxter Flattery.

## 2019-03-31 NOTE — Addendum Note (Signed)
Addended by: [place] Callas on: 03/31/2019 12:22 PM   Modules accepted: Orders

## 2019-03-31 NOTE — Telephone Encounter (Signed)
Janice Williams called to advise her pharmacy has not gotten her refill order. Spoke with the pharmacy and was advised Tramadol is able to be called in but she has been on this medication for so long and all they have gotten is phone orders so they need an actual Rx or e-scribe order every once in a while.   Called Janice Williams and advised of the reason for delay.

## 2019-04-01 DIAGNOSIS — B20 Human immunodeficiency virus [HIV] disease: Secondary | ICD-10-CM | POA: Diagnosis not present

## 2019-04-01 DIAGNOSIS — I10 Essential (primary) hypertension: Secondary | ICD-10-CM | POA: Diagnosis not present

## 2019-04-01 DIAGNOSIS — J449 Chronic obstructive pulmonary disease, unspecified: Secondary | ICD-10-CM | POA: Diagnosis not present

## 2019-04-01 MED ORDER — TRAMADOL HCL 50 MG PO TABS: 50.0000 mg | ORAL_TABLET | Freq: Two times a day (BID) | ORAL | 0 refills | Status: DC | PRN

## 2019-04-01 NOTE — Telephone Encounter (Signed)
Called patient to let her know the refill has been sent to her pharmacy.

## 2019-04-01 NOTE — Telephone Encounter (Signed)
Done

## 2019-04-01 NOTE — Addendum Note (Signed)
Addended by: [place] Callas on: 04/01/2019 08:13 AM   Modules accepted: Orders

## 2019-04-01 NOTE — Telephone Encounter (Signed)
Patient returned call. Informed her that Tramadol was sent electronically to [facility].  Patient verbalized understanding. Pricilla Riffle RN

## 2019-04-02 DIAGNOSIS — J449 Chronic obstructive pulmonary disease, unspecified: Secondary | ICD-10-CM | POA: Diagnosis not present

## 2019-04-02 DIAGNOSIS — B20 Human immunodeficiency virus [HIV] disease: Secondary | ICD-10-CM | POA: Diagnosis not present

## 2019-04-02 DIAGNOSIS — I10 Essential (primary) hypertension: Secondary | ICD-10-CM | POA: Diagnosis not present

## 2019-04-03 ENCOUNTER — Telehealth: Payer: Self-pay

## 2019-04-03 DIAGNOSIS — I10 Essential (primary) hypertension: Secondary | ICD-10-CM | POA: Diagnosis not present

## 2019-04-03 DIAGNOSIS — J449 Chronic obstructive pulmonary disease, unspecified: Secondary | ICD-10-CM | POA: Diagnosis not present

## 2019-04-03 DIAGNOSIS — B20 Human immunodeficiency virus [HIV] disease: Secondary | ICD-10-CM | POA: Diagnosis not present

## 2019-04-03 NOTE — Telephone Encounter (Signed)
Patient complains of pressure behind her eyes and nose. States she is currently using nettie pot, amoxicillin, tessalon, and singluair. Patient states nothing is helping. Routing call to provider for any additional advise.   Janice Mcalpine, LPN

## 2019-04-04 DIAGNOSIS — J449 Chronic obstructive pulmonary disease, unspecified: Secondary | ICD-10-CM | POA: Diagnosis not present

## 2019-04-04 DIAGNOSIS — B20 Human immunodeficiency virus [HIV] disease: Secondary | ICD-10-CM | POA: Diagnosis not present

## 2019-04-04 DIAGNOSIS — I10 Essential (primary) hypertension: Secondary | ICD-10-CM | POA: Diagnosis not present

## 2019-04-04 NOTE — Addendum Note (Signed)
Addended by: Lodi Callas on: 04/04/2019 12:00 PM   Modules accepted: Orders

## 2019-04-05 DIAGNOSIS — I10 Essential (primary) hypertension: Secondary | ICD-10-CM | POA: Diagnosis not present

## 2019-04-05 DIAGNOSIS — B20 Human immunodeficiency virus [HIV] disease: Secondary | ICD-10-CM | POA: Diagnosis not present

## 2019-04-05 DIAGNOSIS — J449 Chronic obstructive pulmonary disease, unspecified: Secondary | ICD-10-CM | POA: Diagnosis not present

## 2019-04-06 DIAGNOSIS — B20 Human immunodeficiency virus [HIV] disease: Secondary | ICD-10-CM | POA: Diagnosis not present

## 2019-04-06 DIAGNOSIS — I10 Essential (primary) hypertension: Secondary | ICD-10-CM | POA: Diagnosis not present

## 2019-04-06 DIAGNOSIS — J449 Chronic obstructive pulmonary disease, unspecified: Secondary | ICD-10-CM | POA: Diagnosis not present

## 2019-04-07 DIAGNOSIS — J449 Chronic obstructive pulmonary disease, unspecified: Secondary | ICD-10-CM | POA: Diagnosis not present

## 2019-04-07 DIAGNOSIS — I10 Essential (primary) hypertension: Secondary | ICD-10-CM | POA: Diagnosis not present

## 2019-04-07 DIAGNOSIS — B20 Human immunodeficiency virus [HIV] disease: Secondary | ICD-10-CM | POA: Diagnosis not present

## 2019-04-07 NOTE — Addendum Note (Signed)
Addended by: Eugenia Mcalpine on: 04/07/2019 12:06 PM   Modules accepted: Orders

## 2019-04-07 NOTE — Telephone Encounter (Signed)
Sorry my phone call note went off my to do list for some reason -   If she is still having symptoms I presume this is not related to bacterial process as she was on a very good antibiotic.  I would keep encouraging her allergy medication and I can offer a 5 day course of prednisone if she would like to try. Would also encourage her to use flonase nasal spray (I don't see that mentioned).

## 2019-04-07 NOTE — Telephone Encounter (Signed)
Excellent I am glad to hear that she is feeling better.

## 2019-04-08 DIAGNOSIS — B20 Human immunodeficiency virus [HIV] disease: Secondary | ICD-10-CM | POA: Diagnosis not present

## 2019-04-08 DIAGNOSIS — I10 Essential (primary) hypertension: Secondary | ICD-10-CM | POA: Diagnosis not present

## 2019-04-08 DIAGNOSIS — J449 Chronic obstructive pulmonary disease, unspecified: Secondary | ICD-10-CM | POA: Diagnosis not present

## 2019-04-09 DIAGNOSIS — I10 Essential (primary) hypertension: Secondary | ICD-10-CM | POA: Diagnosis not present

## 2019-04-09 DIAGNOSIS — B20 Human immunodeficiency virus [HIV] disease: Secondary | ICD-10-CM | POA: Diagnosis not present

## 2019-04-09 DIAGNOSIS — J449 Chronic obstructive pulmonary disease, unspecified: Secondary | ICD-10-CM | POA: Diagnosis not present

## 2019-04-10 DIAGNOSIS — I10 Essential (primary) hypertension: Secondary | ICD-10-CM | POA: Diagnosis not present

## 2019-04-10 DIAGNOSIS — J449 Chronic obstructive pulmonary disease, unspecified: Secondary | ICD-10-CM | POA: Diagnosis not present

## 2019-04-10 DIAGNOSIS — B20 Human immunodeficiency virus [HIV] disease: Secondary | ICD-10-CM | POA: Diagnosis not present

## 2019-04-11 DIAGNOSIS — I10 Essential (primary) hypertension: Secondary | ICD-10-CM | POA: Diagnosis not present

## 2019-04-11 DIAGNOSIS — B20 Human immunodeficiency virus [HIV] disease: Secondary | ICD-10-CM | POA: Diagnosis not present

## 2019-04-11 DIAGNOSIS — J449 Chronic obstructive pulmonary disease, unspecified: Secondary | ICD-10-CM | POA: Diagnosis not present

## 2019-04-12 DIAGNOSIS — I10 Essential (primary) hypertension: Secondary | ICD-10-CM | POA: Diagnosis not present

## 2019-04-12 DIAGNOSIS — B20 Human immunodeficiency virus [HIV] disease: Secondary | ICD-10-CM | POA: Diagnosis not present

## 2019-04-12 DIAGNOSIS — J449 Chronic obstructive pulmonary disease, unspecified: Secondary | ICD-10-CM | POA: Diagnosis not present

## 2019-04-13 DIAGNOSIS — I10 Essential (primary) hypertension: Secondary | ICD-10-CM | POA: Diagnosis not present

## 2019-04-13 DIAGNOSIS — B20 Human immunodeficiency virus [HIV] disease: Secondary | ICD-10-CM | POA: Diagnosis not present

## 2019-04-13 DIAGNOSIS — J449 Chronic obstructive pulmonary disease, unspecified: Secondary | ICD-10-CM | POA: Diagnosis not present

## 2019-04-14 DIAGNOSIS — J449 Chronic obstructive pulmonary disease, unspecified: Secondary | ICD-10-CM | POA: Diagnosis not present

## 2019-04-14 DIAGNOSIS — I10 Essential (primary) hypertension: Secondary | ICD-10-CM | POA: Diagnosis not present

## 2019-04-14 DIAGNOSIS — B20 Human immunodeficiency virus [HIV] disease: Secondary | ICD-10-CM | POA: Diagnosis not present

## 2019-04-15 DIAGNOSIS — I10 Essential (primary) hypertension: Secondary | ICD-10-CM | POA: Diagnosis not present

## 2019-04-15 DIAGNOSIS — J449 Chronic obstructive pulmonary disease, unspecified: Secondary | ICD-10-CM | POA: Diagnosis not present

## 2019-04-15 DIAGNOSIS — B20 Human immunodeficiency virus [HIV] disease: Secondary | ICD-10-CM | POA: Diagnosis not present

## 2019-04-16 DIAGNOSIS — J449 Chronic obstructive pulmonary disease, unspecified: Secondary | ICD-10-CM | POA: Diagnosis not present

## 2019-04-16 DIAGNOSIS — I10 Essential (primary) hypertension: Secondary | ICD-10-CM | POA: Diagnosis not present

## 2019-04-16 DIAGNOSIS — B20 Human immunodeficiency virus [HIV] disease: Secondary | ICD-10-CM | POA: Diagnosis not present

## 2019-04-17 DIAGNOSIS — J449 Chronic obstructive pulmonary disease, unspecified: Secondary | ICD-10-CM | POA: Diagnosis not present

## 2019-04-17 DIAGNOSIS — B20 Human immunodeficiency virus [HIV] disease: Secondary | ICD-10-CM | POA: Diagnosis not present

## 2019-04-17 DIAGNOSIS — I10 Essential (primary) hypertension: Secondary | ICD-10-CM | POA: Diagnosis not present

## 2019-04-18 DIAGNOSIS — J449 Chronic obstructive pulmonary disease, unspecified: Secondary | ICD-10-CM | POA: Diagnosis not present

## 2019-04-18 DIAGNOSIS — I10 Essential (primary) hypertension: Secondary | ICD-10-CM | POA: Diagnosis not present

## 2019-04-18 DIAGNOSIS — B20 Human immunodeficiency virus [HIV] disease: Secondary | ICD-10-CM | POA: Diagnosis not present

## 2019-04-19 DIAGNOSIS — J449 Chronic obstructive pulmonary disease, unspecified: Secondary | ICD-10-CM | POA: Diagnosis not present

## 2019-04-19 DIAGNOSIS — B20 Human immunodeficiency virus [HIV] disease: Secondary | ICD-10-CM | POA: Diagnosis not present

## 2019-04-19 DIAGNOSIS — I10 Essential (primary) hypertension: Secondary | ICD-10-CM | POA: Diagnosis not present

## 2019-04-20 DIAGNOSIS — B20 Human immunodeficiency virus [HIV] disease: Secondary | ICD-10-CM | POA: Diagnosis not present

## 2019-04-20 DIAGNOSIS — I10 Essential (primary) hypertension: Secondary | ICD-10-CM | POA: Diagnosis not present

## 2019-04-20 DIAGNOSIS — J449 Chronic obstructive pulmonary disease, unspecified: Secondary | ICD-10-CM | POA: Diagnosis not present

## 2019-04-21 DIAGNOSIS — J449 Chronic obstructive pulmonary disease, unspecified: Secondary | ICD-10-CM | POA: Diagnosis not present

## 2019-04-21 DIAGNOSIS — B20 Human immunodeficiency virus [HIV] disease: Secondary | ICD-10-CM | POA: Diagnosis not present

## 2019-04-21 DIAGNOSIS — I10 Essential (primary) hypertension: Secondary | ICD-10-CM | POA: Diagnosis not present

## 2019-04-22 ENCOUNTER — Other Ambulatory Visit: Payer: Self-pay | Admitting: Infectious Diseases

## 2019-04-22 DIAGNOSIS — I10 Essential (primary) hypertension: Secondary | ICD-10-CM | POA: Diagnosis not present

## 2019-04-22 DIAGNOSIS — B20 Human immunodeficiency virus [HIV] disease: Secondary | ICD-10-CM | POA: Diagnosis not present

## 2019-04-22 DIAGNOSIS — J449 Chronic obstructive pulmonary disease, unspecified: Secondary | ICD-10-CM | POA: Diagnosis not present

## 2019-04-22 DIAGNOSIS — M1612 Unilateral primary osteoarthritis, left hip: Secondary | ICD-10-CM

## 2019-04-23 ENCOUNTER — Other Ambulatory Visit: Payer: Self-pay | Admitting: Internal Medicine

## 2019-04-23 DIAGNOSIS — I10 Essential (primary) hypertension: Secondary | ICD-10-CM | POA: Diagnosis not present

## 2019-04-23 DIAGNOSIS — B20 Human immunodeficiency virus [HIV] disease: Secondary | ICD-10-CM | POA: Diagnosis not present

## 2019-04-23 DIAGNOSIS — J449 Chronic obstructive pulmonary disease, unspecified: Secondary | ICD-10-CM | POA: Diagnosis not present

## 2019-04-24 DIAGNOSIS — B20 Human immunodeficiency virus [HIV] disease: Secondary | ICD-10-CM | POA: Diagnosis not present

## 2019-04-24 DIAGNOSIS — I10 Essential (primary) hypertension: Secondary | ICD-10-CM | POA: Diagnosis not present

## 2019-04-24 DIAGNOSIS — Z09 Encounter for follow-up examination after completed treatment for conditions other than malignant neoplasm: Secondary | ICD-10-CM | POA: Diagnosis not present

## 2019-04-24 DIAGNOSIS — J329 Chronic sinusitis, unspecified: Secondary | ICD-10-CM | POA: Diagnosis not present

## 2019-04-24 DIAGNOSIS — J449 Chronic obstructive pulmonary disease, unspecified: Secondary | ICD-10-CM | POA: Diagnosis not present

## 2019-04-25 ENCOUNTER — Other Ambulatory Visit: Payer: Self-pay | Admitting: Internal Medicine

## 2019-04-25 DIAGNOSIS — B20 Human immunodeficiency virus [HIV] disease: Secondary | ICD-10-CM | POA: Diagnosis not present

## 2019-04-25 DIAGNOSIS — J449 Chronic obstructive pulmonary disease, unspecified: Secondary | ICD-10-CM | POA: Diagnosis not present

## 2019-04-25 DIAGNOSIS — I10 Essential (primary) hypertension: Secondary | ICD-10-CM | POA: Diagnosis not present

## 2019-04-25 DIAGNOSIS — M1612 Unilateral primary osteoarthritis, left hip: Secondary | ICD-10-CM

## 2019-04-26 DIAGNOSIS — I10 Essential (primary) hypertension: Secondary | ICD-10-CM | POA: Diagnosis not present

## 2019-04-26 DIAGNOSIS — J449 Chronic obstructive pulmonary disease, unspecified: Secondary | ICD-10-CM | POA: Diagnosis not present

## 2019-04-26 DIAGNOSIS — B20 Human immunodeficiency virus [HIV] disease: Secondary | ICD-10-CM | POA: Diagnosis not present

## 2019-04-27 DIAGNOSIS — J449 Chronic obstructive pulmonary disease, unspecified: Secondary | ICD-10-CM | POA: Diagnosis not present

## 2019-04-27 DIAGNOSIS — B20 Human immunodeficiency virus [HIV] disease: Secondary | ICD-10-CM | POA: Diagnosis not present

## 2019-04-27 DIAGNOSIS — I10 Essential (primary) hypertension: Secondary | ICD-10-CM | POA: Diagnosis not present

## 2019-04-28 ENCOUNTER — Ambulatory Visit (INDEPENDENT_AMBULATORY_CARE_PROVIDER_SITE_OTHER): Payer: Medicaid Other | Admitting: Family

## 2019-04-28 ENCOUNTER — Other Ambulatory Visit: Payer: Self-pay

## 2019-04-28 ENCOUNTER — Encounter: Payer: Self-pay | Admitting: Family

## 2019-04-28 DIAGNOSIS — Z0000 Encounter for general adult medical examination without abnormal findings: Secondary | ICD-10-CM

## 2019-04-28 DIAGNOSIS — I10 Essential (primary) hypertension: Secondary | ICD-10-CM | POA: Diagnosis not present

## 2019-04-28 DIAGNOSIS — B20 Human immunodeficiency virus [HIV] disease: Secondary | ICD-10-CM | POA: Diagnosis not present

## 2019-04-28 DIAGNOSIS — M1612 Unilateral primary osteoarthritis, left hip: Secondary | ICD-10-CM

## 2019-04-28 DIAGNOSIS — J449 Chronic obstructive pulmonary disease, unspecified: Secondary | ICD-10-CM | POA: Diagnosis not present

## 2019-04-28 MED ORDER — TRAMADOL HCL 50 MG PO TABS: ORAL_TABLET | ORAL | 3 refills | Status: DC

## 2019-04-28 MED ORDER — BICTEGRAVIR-EMTRICITAB-TENOFOV 50-200-25 MG PO TABS: 1.0000 | ORAL_TABLET | Freq: Every day | ORAL | 4 refills | Status: DC

## 2019-04-28 MED FILL — BIKTARVY 50-200-25 MG TABS: 50-200-25 | 30 days supply | Qty: 30 | Fill #0

## 2019-04-28 NOTE — Progress Notes (Signed)
Subjective:    Patient ID: Janice Williams, female    DOB: 23-Apr-1961, 58 y.o.   MRN: [id number]  Chief Complaint  Patient presents with  . evisit    tramadol questions     Virtual Visit via Telephone Note   I connected with [name] on 04/28/2019 at 11:00 AM  by telephone and verified that I am speaking with the correct person using two identifiers.   I discussed the limitations, risks, security and privacy concerns of performing an evaluation and management service by telephone and the availability of in person appointments. I also discussed with the patient that there may be a patient responsible charge related to this service. The patient expressed understanding and agreed to proceed.   HPI:  Janice Williams is a 58 y.o. female with HIV disease who was last seen in the office on 12/02/2018 with good adherence and tolerance to her ART regimen of Biktarvy.  Lab work at the time with a CD4 count of 910 and a viral load of 39 and undetectable.  Most recent lab work completed on 03/06/2019 with viral load that remains undetectable with CD4 count of 850.  Renal function, liver function, and electrolytes within normal ranges.  Health care maintenance due includes dental exam.  Janice Williams has continued to take her Biktarvy as prescribed with no adverse side effects or missed doses.  Overall she feels Williams although has aches related to her osteoarthritis that is adequately controlled with her current dose of tramadol. Denies fevers, chills, night sweats, headaches, changes in vision, neck pain/stiffness, nausea, diarrhea, vomiting, lesions or rashes.  Janice Williams continues to remain covered through Medicaid/Medicare and has no problems obtaining her medication from [facility]. No recreational or illicit drug use. She is not currently sexually active. Denies feelings of being down, depressed or hopeless. Last mammogram less than 1 year ago. Due for a dental screening as it  has been over a year.   Depression screen [facility] 2/9 04/28/2019 03/25/2019 11/12/2018  Decreased Interest 0 0 0  Down, Depressed, Hopeless 0 0 0  PHQ - 2 Score 0 0 0  Some recent data might be hidden    Allergies  Allergen Reactions  . Chantix [Varenicline] Itching      Outpatient Medications Prior to Visit  Medication Sig Dispense Refill  . albuterol (PROVENTIL HFA;VENTOLIN HFA) 108 (90 Base) MCG/ACT inhaler Inhale 2 puffs into the lungs every 6 (six) hours as needed for wheezing or shortness of breath. 1 Inhaler 5  . amLODipine (NORVASC) 10 MG tablet Take 1 tablet (10 mg total) by mouth daily. 30 tablet 1  . dronabinol (MARINOL) 5 MG capsule Take 1 capsule (5 mg total) by mouth 2 (two) times daily before a meal. 60 capsule 3  . ENSURE (ENSURE) Take 237 mLs by mouth 3 (three) times daily between meals. 237 mL 5  . fluticasone (FLONASE) 50 MCG/ACT nasal spray Place 2 sprays into both nostrils daily. 16 g 0  . hydrocortisone (ANUSOL-HC) 2.5 % rectal cream Apply rectally 2 times daily 28.35 g 0  . loratadine (CLARITIN) 10 MG tablet Take 1 tablet (10 mg total) by mouth daily. 30 tablet 2  . montelukast (SINGULAIR) 10 MG tablet TAKE 1 TABLET(10 MG) BY MOUTH AT BEDTIME 30 tablet 0  . omeprazole (PRILOSEC) 20 MG capsule TAKE 1 CAPSULE BY MOUTH EVERY DAY 30 capsule 5  . ondansetron (ZOFRAN) 4 MG tablet Take 1 tablet (4 mg total) by mouth  every 8 (eight) hours as needed for nausea or vomiting. 20 tablet 0  . polyethylene glycol (MIRALAX / GLYCOLAX) packet Take one capful in 8 oz of fluid of your choice twice a day until having soft stools, then back down to once a day.  If not having soft stools with twice a day, increased to three times a day 100 each 0  . pramoxine (PROCTOFOAM) 1 % foam Place 1 application rectally 3 (three) times daily as needed. 15 g 3  . pravastatin (PRAVACHOL) 20 MG tablet TAKE 1 TABLET BY MOUTH EVERY DAY 30 tablet 5  . sodium chloride (OCEAN) 0.65 % SOLN nasal spray Place 2  sprays into both nostrils 3 (three) times daily. 1 Bottle 0  . SYMBICORT 80-4.5 MCG/ACT inhaler INHALE 2 PUFFS INTO THE LUNGS TWICE DAILY 10.2 g 0  . traMADol (ULTRAM) 50 MG tablet TAKE 1 TABLET(50 MG) BY MOUTH EVERY 12 HOURS AS NEEDED FOR PAIN 60 tablet 3  . Witch Hazel (TUCKS) 50 % PADS Use as needed for rectal itching, after having BM 40 each 0  . azelastine (OPTIVAR) 0.05 % ophthalmic solution Place 1 drop into both eyes 2 (two) times daily. 6 mL 1  . BIKTARVY 50-200-25 MG TABS tablet TAKE 1 TABLET BY MOUTH DAILY. 30 tablet 2  . benzonatate (TESSALON) 100 MG capsule Take 1 capsule (100 mg total) by mouth every 8 (eight) hours. 21 capsule 0  . Emollient (ROC RETINOL CORREXION EX) Apply topically.    . predniSONE (STERAPRED UNI-PAK 21 TAB) 10 MG (21) TBPK tablet Day 1 and 2 - take 4 pills in the morning. Day 3-4 take 3 pills, Day 5-6 take 2 pills, then take 1 pill once a day until gone 21 tablet 0  . traMADol (ULTRAM) 50 MG tablet TAKE 1 TABLET(50 MG) BY MOUTH EVERY 12 HOURS AS NEEDED FOR PAIN 60 tablet 3   No facility-administered medications prior to visit.      Past Medical History:  Diagnosis Date  . Allergy   . Anemia   . Arthritis    lower back, knees  . Bronchitis   . COPD (chronic obstructive pulmonary disease) ([place])   . Former smoker    quit 2014  . GERD (gastroesophageal reflux disease)   . HIV infection ([place])   . Hypertension   . Stroke ([place])   . 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 SVD (spontaneous vaginal delivery)    x 3     Past Surgical History:  Procedure Laterality Date  . MULTIPLE TOOTH EXTRACTIONS     with sedation  . TUBAL LIGATION    . UPPER GI ENDOSCOPY     normal per patient - "years ago"       Review of Systems  Constitutional: Negative for appetite change, chills, diaphoresis, fatigue, fever and unexpected weight change.  Eyes:       Negative for acute change in vision  Respiratory: Negative for chest tightness, shortness  of breath and wheezing.   Cardiovascular: Negative for chest pain.  Gastrointestinal: Negative for diarrhea, nausea and vomiting.  Genitourinary: Negative for dysuria, pelvic pain and vaginal discharge.  Musculoskeletal: Positive for arthralgias. Negative for neck pain and neck stiffness.  Skin: Negative for rash.  Neurological: Negative for seizures, syncope, weakness and headaches.  Hematological: Negative for adenopathy. Does not bruise/bleed easily.  Psychiatric/Behavioral: Negative for hallucinations.      Objective:    Nursing note and vital signs reviewed.  Janice Williams is pleasant to speak with and sounds to be doing Williams.  Assessment & Plan:   Problem List Items Addressed This Visit      Other   Human immunodeficiency virus (HIV) disease ([place]) - Primary   Relevant Medications   bictegravir-emtricitabine-tenofovir AF (BIKTARVY) 50-200-25 MG TABS tablet   Healthcare maintenance     Due for dental exam. Will plan for referral to [place] clinic at next office visit.   Discussed importance of safe sexual practice to reduce risk of transmission/acquitisiton of STI.  Colon cancer and cervical cancer screening up to date.  Last mammogram less than a year ago.           I have discontinued Zitlali D. Vespa's azelastine, predniSONE, Emollient (ROC RETINOL CORREXION EX), and benzonatate. I have also changed her Biktarvy to bictegravir-emtricitabine-tenofovir AF. Additionally, I am having her maintain her polyethylene glycol, Tucks, sodium chloride, dronabinol, hydrocortisone, pramoxine, amLODipine, Ensure, ondansetron, albuterol, loratadine, pravastatin, omeprazole, fluticasone, Symbicort, montelukast, and traMADol.   Meds ordered this encounter  Medications  . bictegravir-emtricitabine-tenofovir AF (BIKTARVY) 50-200-25 MG TABS tablet    Sig: Take 1 tablet by mouth daily.    Dispense:  30 tablet    Refill:  4    Order Specific Question:   Supervising Provider     Answer:   Carlyle Basques [phone]     I discussed the assessment and treatment plan with the patient. The patient was provided an opportunity to ask questions and all were answered. The patient agreed with the plan and demonstrated an understanding of the instructions.   The patient was advised to call back or seek an in-person evaluation if the symptoms worsen or if the condition fails to improve as anticipated.   I provided 14  minutes of non-face-to-face time during this encounter.  Follow-up: Return in about 4 months (around 08/29/2019).   Terri Piedra, MSN, FNP-C Nurse Practitioner [facility] for Infectious Disease [place] number: [phone]

## 2019-04-28 NOTE — Assessment & Plan Note (Signed)
   Due for dental exam. Will plan for referral to [facility] dental clinic at next office visit.   Discussed importance of safe sexual practice to reduce risk of transmission/acquitisiton of STI.  Colon cancer and cervical cancer screening up to date.  Last mammogram less than a year ago.

## 2019-04-28 NOTE — Telephone Encounter (Signed)
Duplicate

## 2019-04-28 NOTE — Patient Instructions (Signed)
Nice to speak with you.  Please continue to take your Crystal as prescribed.  Plan for follow up in 4 months or sooner if needed with Dr. Baxter Flattery with lab work same day.

## 2019-04-29 DIAGNOSIS — B20 Human immunodeficiency virus [HIV] disease: Secondary | ICD-10-CM | POA: Diagnosis not present

## 2019-04-29 DIAGNOSIS — I10 Essential (primary) hypertension: Secondary | ICD-10-CM | POA: Diagnosis not present

## 2019-04-29 DIAGNOSIS — J449 Chronic obstructive pulmonary disease, unspecified: Secondary | ICD-10-CM | POA: Diagnosis not present

## 2019-04-30 ENCOUNTER — Other Ambulatory Visit: Payer: Self-pay | Admitting: Internal Medicine

## 2019-04-30 DIAGNOSIS — I10 Essential (primary) hypertension: Secondary | ICD-10-CM

## 2019-04-30 DIAGNOSIS — B20 Human immunodeficiency virus [HIV] disease: Secondary | ICD-10-CM | POA: Diagnosis not present

## 2019-04-30 DIAGNOSIS — J449 Chronic obstructive pulmonary disease, unspecified: Secondary | ICD-10-CM | POA: Diagnosis not present

## 2019-05-01 DIAGNOSIS — J449 Chronic obstructive pulmonary disease, unspecified: Secondary | ICD-10-CM | POA: Diagnosis not present

## 2019-05-01 DIAGNOSIS — B20 Human immunodeficiency virus [HIV] disease: Secondary | ICD-10-CM | POA: Diagnosis not present

## 2019-05-01 DIAGNOSIS — I10 Essential (primary) hypertension: Secondary | ICD-10-CM | POA: Diagnosis not present

## 2019-05-02 DIAGNOSIS — J449 Chronic obstructive pulmonary disease, unspecified: Secondary | ICD-10-CM | POA: Diagnosis not present

## 2019-05-02 DIAGNOSIS — B20 Human immunodeficiency virus [HIV] disease: Secondary | ICD-10-CM | POA: Diagnosis not present

## 2019-05-02 DIAGNOSIS — I10 Essential (primary) hypertension: Secondary | ICD-10-CM | POA: Diagnosis not present

## 2019-05-03 DIAGNOSIS — I10 Essential (primary) hypertension: Secondary | ICD-10-CM | POA: Diagnosis not present

## 2019-05-03 DIAGNOSIS — B20 Human immunodeficiency virus [HIV] disease: Secondary | ICD-10-CM | POA: Diagnosis not present

## 2019-05-03 DIAGNOSIS — J449 Chronic obstructive pulmonary disease, unspecified: Secondary | ICD-10-CM | POA: Diagnosis not present

## 2019-05-04 DIAGNOSIS — B20 Human immunodeficiency virus [HIV] disease: Secondary | ICD-10-CM | POA: Diagnosis not present

## 2019-05-04 DIAGNOSIS — I10 Essential (primary) hypertension: Secondary | ICD-10-CM | POA: Diagnosis not present

## 2019-05-04 DIAGNOSIS — J449 Chronic obstructive pulmonary disease, unspecified: Secondary | ICD-10-CM | POA: Diagnosis not present

## 2019-05-05 DIAGNOSIS — I10 Essential (primary) hypertension: Secondary | ICD-10-CM | POA: Diagnosis not present

## 2019-05-05 DIAGNOSIS — B20 Human immunodeficiency virus [HIV] disease: Secondary | ICD-10-CM | POA: Diagnosis not present

## 2019-05-05 DIAGNOSIS — J449 Chronic obstructive pulmonary disease, unspecified: Secondary | ICD-10-CM | POA: Diagnosis not present

## 2019-05-06 DIAGNOSIS — J449 Chronic obstructive pulmonary disease, unspecified: Secondary | ICD-10-CM | POA: Diagnosis not present

## 2019-05-06 DIAGNOSIS — B20 Human immunodeficiency virus [HIV] disease: Secondary | ICD-10-CM | POA: Diagnosis not present

## 2019-05-06 DIAGNOSIS — I10 Essential (primary) hypertension: Secondary | ICD-10-CM | POA: Diagnosis not present

## 2019-05-07 DIAGNOSIS — I10 Essential (primary) hypertension: Secondary | ICD-10-CM | POA: Diagnosis not present

## 2019-05-07 DIAGNOSIS — J449 Chronic obstructive pulmonary disease, unspecified: Secondary | ICD-10-CM | POA: Diagnosis not present

## 2019-05-07 DIAGNOSIS — B20 Human immunodeficiency virus [HIV] disease: Secondary | ICD-10-CM | POA: Diagnosis not present

## 2019-05-08 DIAGNOSIS — I10 Essential (primary) hypertension: Secondary | ICD-10-CM | POA: Diagnosis not present

## 2019-05-08 DIAGNOSIS — J449 Chronic obstructive pulmonary disease, unspecified: Secondary | ICD-10-CM | POA: Diagnosis not present

## 2019-05-08 DIAGNOSIS — B20 Human immunodeficiency virus [HIV] disease: Secondary | ICD-10-CM | POA: Diagnosis not present

## 2019-05-09 DIAGNOSIS — J449 Chronic obstructive pulmonary disease, unspecified: Secondary | ICD-10-CM | POA: Diagnosis not present

## 2019-05-09 DIAGNOSIS — I10 Essential (primary) hypertension: Secondary | ICD-10-CM | POA: Diagnosis not present

## 2019-05-09 DIAGNOSIS — B20 Human immunodeficiency virus [HIV] disease: Secondary | ICD-10-CM | POA: Diagnosis not present

## 2019-05-10 DIAGNOSIS — B20 Human immunodeficiency virus [HIV] disease: Secondary | ICD-10-CM | POA: Diagnosis not present

## 2019-05-10 DIAGNOSIS — I10 Essential (primary) hypertension: Secondary | ICD-10-CM | POA: Diagnosis not present

## 2019-05-10 DIAGNOSIS — J449 Chronic obstructive pulmonary disease, unspecified: Secondary | ICD-10-CM | POA: Diagnosis not present

## 2019-05-11 DIAGNOSIS — B20 Human immunodeficiency virus [HIV] disease: Secondary | ICD-10-CM | POA: Diagnosis not present

## 2019-05-11 DIAGNOSIS — J449 Chronic obstructive pulmonary disease, unspecified: Secondary | ICD-10-CM | POA: Diagnosis not present

## 2019-05-11 DIAGNOSIS — I10 Essential (primary) hypertension: Secondary | ICD-10-CM | POA: Diagnosis not present

## 2019-05-12 DIAGNOSIS — J449 Chronic obstructive pulmonary disease, unspecified: Secondary | ICD-10-CM | POA: Diagnosis not present

## 2019-05-12 DIAGNOSIS — B20 Human immunodeficiency virus [HIV] disease: Secondary | ICD-10-CM | POA: Diagnosis not present

## 2019-05-12 DIAGNOSIS — I10 Essential (primary) hypertension: Secondary | ICD-10-CM | POA: Diagnosis not present

## 2019-05-13 DIAGNOSIS — I10 Essential (primary) hypertension: Secondary | ICD-10-CM | POA: Diagnosis not present

## 2019-05-13 DIAGNOSIS — J449 Chronic obstructive pulmonary disease, unspecified: Secondary | ICD-10-CM | POA: Diagnosis not present

## 2019-05-13 DIAGNOSIS — B20 Human immunodeficiency virus [HIV] disease: Secondary | ICD-10-CM | POA: Diagnosis not present

## 2019-05-14 DIAGNOSIS — B20 Human immunodeficiency virus [HIV] disease: Secondary | ICD-10-CM | POA: Diagnosis not present

## 2019-05-14 DIAGNOSIS — J449 Chronic obstructive pulmonary disease, unspecified: Secondary | ICD-10-CM | POA: Diagnosis not present

## 2019-05-14 DIAGNOSIS — I10 Essential (primary) hypertension: Secondary | ICD-10-CM | POA: Diagnosis not present

## 2019-05-15 DIAGNOSIS — I10 Essential (primary) hypertension: Secondary | ICD-10-CM | POA: Diagnosis not present

## 2019-05-15 DIAGNOSIS — J449 Chronic obstructive pulmonary disease, unspecified: Secondary | ICD-10-CM | POA: Diagnosis not present

## 2019-05-15 DIAGNOSIS — B20 Human immunodeficiency virus [HIV] disease: Secondary | ICD-10-CM | POA: Diagnosis not present

## 2019-05-16 DIAGNOSIS — J449 Chronic obstructive pulmonary disease, unspecified: Secondary | ICD-10-CM | POA: Diagnosis not present

## 2019-05-16 DIAGNOSIS — B20 Human immunodeficiency virus [HIV] disease: Secondary | ICD-10-CM | POA: Diagnosis not present

## 2019-05-16 DIAGNOSIS — I10 Essential (primary) hypertension: Secondary | ICD-10-CM | POA: Diagnosis not present

## 2019-05-17 DIAGNOSIS — J449 Chronic obstructive pulmonary disease, unspecified: Secondary | ICD-10-CM | POA: Diagnosis not present

## 2019-05-17 DIAGNOSIS — I10 Essential (primary) hypertension: Secondary | ICD-10-CM | POA: Diagnosis not present

## 2019-05-17 DIAGNOSIS — B20 Human immunodeficiency virus [HIV] disease: Secondary | ICD-10-CM | POA: Diagnosis not present

## 2019-05-18 DIAGNOSIS — J449 Chronic obstructive pulmonary disease, unspecified: Secondary | ICD-10-CM | POA: Diagnosis not present

## 2019-05-18 DIAGNOSIS — I10 Essential (primary) hypertension: Secondary | ICD-10-CM | POA: Diagnosis not present

## 2019-05-18 DIAGNOSIS — B20 Human immunodeficiency virus [HIV] disease: Secondary | ICD-10-CM | POA: Diagnosis not present

## 2019-05-19 DIAGNOSIS — B20 Human immunodeficiency virus [HIV] disease: Secondary | ICD-10-CM | POA: Diagnosis not present

## 2019-05-19 DIAGNOSIS — J449 Chronic obstructive pulmonary disease, unspecified: Secondary | ICD-10-CM | POA: Diagnosis not present

## 2019-05-19 DIAGNOSIS — I10 Essential (primary) hypertension: Secondary | ICD-10-CM | POA: Diagnosis not present

## 2019-05-20 DIAGNOSIS — B20 Human immunodeficiency virus [HIV] disease: Secondary | ICD-10-CM | POA: Diagnosis not present

## 2019-05-20 DIAGNOSIS — I10 Essential (primary) hypertension: Secondary | ICD-10-CM | POA: Diagnosis not present

## 2019-05-20 DIAGNOSIS — J449 Chronic obstructive pulmonary disease, unspecified: Secondary | ICD-10-CM | POA: Diagnosis not present

## 2019-05-21 DIAGNOSIS — I10 Essential (primary) hypertension: Secondary | ICD-10-CM | POA: Diagnosis not present

## 2019-05-21 DIAGNOSIS — B20 Human immunodeficiency virus [HIV] disease: Secondary | ICD-10-CM | POA: Diagnosis not present

## 2019-05-21 DIAGNOSIS — J449 Chronic obstructive pulmonary disease, unspecified: Secondary | ICD-10-CM | POA: Diagnosis not present

## 2019-05-22 DIAGNOSIS — J449 Chronic obstructive pulmonary disease, unspecified: Secondary | ICD-10-CM | POA: Diagnosis not present

## 2019-05-22 DIAGNOSIS — I10 Essential (primary) hypertension: Secondary | ICD-10-CM | POA: Diagnosis not present

## 2019-05-22 DIAGNOSIS — B20 Human immunodeficiency virus [HIV] disease: Secondary | ICD-10-CM | POA: Diagnosis not present

## 2019-05-23 DIAGNOSIS — B20 Human immunodeficiency virus [HIV] disease: Secondary | ICD-10-CM | POA: Diagnosis not present

## 2019-05-23 DIAGNOSIS — I10 Essential (primary) hypertension: Secondary | ICD-10-CM | POA: Diagnosis not present

## 2019-05-23 DIAGNOSIS — J449 Chronic obstructive pulmonary disease, unspecified: Secondary | ICD-10-CM | POA: Diagnosis not present

## 2019-05-24 DIAGNOSIS — J449 Chronic obstructive pulmonary disease, unspecified: Secondary | ICD-10-CM | POA: Diagnosis not present

## 2019-05-24 DIAGNOSIS — I10 Essential (primary) hypertension: Secondary | ICD-10-CM | POA: Diagnosis not present

## 2019-05-24 DIAGNOSIS — B20 Human immunodeficiency virus [HIV] disease: Secondary | ICD-10-CM | POA: Diagnosis not present

## 2019-05-25 DIAGNOSIS — J449 Chronic obstructive pulmonary disease, unspecified: Secondary | ICD-10-CM | POA: Diagnosis not present

## 2019-05-25 DIAGNOSIS — I10 Essential (primary) hypertension: Secondary | ICD-10-CM | POA: Diagnosis not present

## 2019-05-25 DIAGNOSIS — B20 Human immunodeficiency virus [HIV] disease: Secondary | ICD-10-CM | POA: Diagnosis not present

## 2019-05-26 DIAGNOSIS — I10 Essential (primary) hypertension: Secondary | ICD-10-CM | POA: Diagnosis not present

## 2019-05-26 DIAGNOSIS — J449 Chronic obstructive pulmonary disease, unspecified: Secondary | ICD-10-CM | POA: Diagnosis not present

## 2019-05-26 DIAGNOSIS — B20 Human immunodeficiency virus [HIV] disease: Secondary | ICD-10-CM | POA: Diagnosis not present

## 2019-05-27 DIAGNOSIS — B20 Human immunodeficiency virus [HIV] disease: Secondary | ICD-10-CM | POA: Diagnosis not present

## 2019-05-27 DIAGNOSIS — I10 Essential (primary) hypertension: Secondary | ICD-10-CM | POA: Diagnosis not present

## 2019-05-27 DIAGNOSIS — J449 Chronic obstructive pulmonary disease, unspecified: Secondary | ICD-10-CM | POA: Diagnosis not present

## 2019-05-28 DIAGNOSIS — J449 Chronic obstructive pulmonary disease, unspecified: Secondary | ICD-10-CM | POA: Diagnosis not present

## 2019-05-28 DIAGNOSIS — I10 Essential (primary) hypertension: Secondary | ICD-10-CM | POA: Diagnosis not present

## 2019-05-28 DIAGNOSIS — B20 Human immunodeficiency virus [HIV] disease: Secondary | ICD-10-CM | POA: Diagnosis not present

## 2019-05-28 MED FILL — BIKTARVY 50-200-25 MG TABS: 50-200-25 | 30 days supply | Qty: 30 | Fill #1

## 2019-05-29 ENCOUNTER — Telehealth: Payer: Self-pay

## 2019-05-29 DIAGNOSIS — I10 Essential (primary) hypertension: Secondary | ICD-10-CM | POA: Diagnosis not present

## 2019-05-29 DIAGNOSIS — B20 Human immunodeficiency virus [HIV] disease: Secondary | ICD-10-CM | POA: Diagnosis not present

## 2019-05-29 DIAGNOSIS — J449 Chronic obstructive pulmonary disease, unspecified: Secondary | ICD-10-CM | POA: Diagnosis not present

## 2019-05-29 NOTE — Telephone Encounter (Signed)
Patient called stating she needed refills on her Tramadol. Informed patient to call her pharmacy and she has x3 refills on file. Patient appreciative of advise.  Eugenia Mcalpine, LPN

## 2019-05-30 ENCOUNTER — Other Ambulatory Visit: Payer: Self-pay | Admitting: *Deleted

## 2019-05-30 DIAGNOSIS — J449 Chronic obstructive pulmonary disease, unspecified: Secondary | ICD-10-CM | POA: Diagnosis not present

## 2019-05-30 DIAGNOSIS — R634 Abnormal weight loss: Secondary | ICD-10-CM

## 2019-05-30 DIAGNOSIS — I10 Essential (primary) hypertension: Secondary | ICD-10-CM | POA: Diagnosis not present

## 2019-05-30 DIAGNOSIS — B20 Human immunodeficiency virus [HIV] disease: Secondary | ICD-10-CM | POA: Diagnosis not present

## 2019-05-30 MED ORDER — ENSURE PO LIQD: 237.0000 mL | Freq: Three times a day (TID) | ORAL | 5 refills | Status: DC

## 2019-05-30 NOTE — Progress Notes (Signed)
Patient requesting refill of ensure. She weighed at home, 96 pounds.  Last visit was telephonic encounter, no weight measured at office. RN sent refill to [facility]. Landis Gandy, RN

## 2019-05-31 DIAGNOSIS — J449 Chronic obstructive pulmonary disease, unspecified: Secondary | ICD-10-CM | POA: Diagnosis not present

## 2019-05-31 DIAGNOSIS — B20 Human immunodeficiency virus [HIV] disease: Secondary | ICD-10-CM | POA: Diagnosis not present

## 2019-05-31 DIAGNOSIS — I10 Essential (primary) hypertension: Secondary | ICD-10-CM | POA: Diagnosis not present

## 2019-06-01 DIAGNOSIS — B20 Human immunodeficiency virus [HIV] disease: Secondary | ICD-10-CM | POA: Diagnosis not present

## 2019-06-01 DIAGNOSIS — J449 Chronic obstructive pulmonary disease, unspecified: Secondary | ICD-10-CM | POA: Diagnosis not present

## 2019-06-01 DIAGNOSIS — I10 Essential (primary) hypertension: Secondary | ICD-10-CM | POA: Diagnosis not present

## 2019-06-02 DIAGNOSIS — I10 Essential (primary) hypertension: Secondary | ICD-10-CM | POA: Diagnosis not present

## 2019-06-02 DIAGNOSIS — B20 Human immunodeficiency virus [HIV] disease: Secondary | ICD-10-CM | POA: Diagnosis not present

## 2019-06-02 DIAGNOSIS — J449 Chronic obstructive pulmonary disease, unspecified: Secondary | ICD-10-CM | POA: Diagnosis not present

## 2019-06-03 DIAGNOSIS — B20 Human immunodeficiency virus [HIV] disease: Secondary | ICD-10-CM | POA: Diagnosis not present

## 2019-06-03 DIAGNOSIS — I10 Essential (primary) hypertension: Secondary | ICD-10-CM | POA: Diagnosis not present

## 2019-06-03 DIAGNOSIS — J449 Chronic obstructive pulmonary disease, unspecified: Secondary | ICD-10-CM | POA: Diagnosis not present

## 2019-06-04 DIAGNOSIS — J449 Chronic obstructive pulmonary disease, unspecified: Secondary | ICD-10-CM | POA: Diagnosis not present

## 2019-06-04 DIAGNOSIS — B20 Human immunodeficiency virus [HIV] disease: Secondary | ICD-10-CM | POA: Diagnosis not present

## 2019-06-04 DIAGNOSIS — I10 Essential (primary) hypertension: Secondary | ICD-10-CM | POA: Diagnosis not present

## 2019-06-05 DIAGNOSIS — J449 Chronic obstructive pulmonary disease, unspecified: Secondary | ICD-10-CM | POA: Diagnosis not present

## 2019-06-05 DIAGNOSIS — B20 Human immunodeficiency virus [HIV] disease: Secondary | ICD-10-CM | POA: Diagnosis not present

## 2019-06-05 DIAGNOSIS — I10 Essential (primary) hypertension: Secondary | ICD-10-CM | POA: Diagnosis not present

## 2019-06-06 DIAGNOSIS — B20 Human immunodeficiency virus [HIV] disease: Secondary | ICD-10-CM | POA: Diagnosis not present

## 2019-06-06 DIAGNOSIS — I10 Essential (primary) hypertension: Secondary | ICD-10-CM | POA: Diagnosis not present

## 2019-06-06 DIAGNOSIS — J449 Chronic obstructive pulmonary disease, unspecified: Secondary | ICD-10-CM | POA: Diagnosis not present

## 2019-06-07 DIAGNOSIS — B20 Human immunodeficiency virus [HIV] disease: Secondary | ICD-10-CM | POA: Diagnosis not present

## 2019-06-07 DIAGNOSIS — I10 Essential (primary) hypertension: Secondary | ICD-10-CM | POA: Diagnosis not present

## 2019-06-07 DIAGNOSIS — J449 Chronic obstructive pulmonary disease, unspecified: Secondary | ICD-10-CM | POA: Diagnosis not present

## 2019-06-08 DIAGNOSIS — B20 Human immunodeficiency virus [HIV] disease: Secondary | ICD-10-CM | POA: Diagnosis not present

## 2019-06-08 DIAGNOSIS — J449 Chronic obstructive pulmonary disease, unspecified: Secondary | ICD-10-CM | POA: Diagnosis not present

## 2019-06-08 DIAGNOSIS — I10 Essential (primary) hypertension: Secondary | ICD-10-CM | POA: Diagnosis not present

## 2019-06-09 ENCOUNTER — Other Ambulatory Visit: Payer: Self-pay | Admitting: *Deleted

## 2019-06-09 DIAGNOSIS — J449 Chronic obstructive pulmonary disease, unspecified: Secondary | ICD-10-CM | POA: Diagnosis not present

## 2019-06-09 DIAGNOSIS — B20 Human immunodeficiency virus [HIV] disease: Secondary | ICD-10-CM | POA: Diagnosis not present

## 2019-06-09 DIAGNOSIS — I10 Essential (primary) hypertension: Secondary | ICD-10-CM | POA: Diagnosis not present

## 2019-06-10 DIAGNOSIS — B20 Human immunodeficiency virus [HIV] disease: Secondary | ICD-10-CM | POA: Diagnosis not present

## 2019-06-10 DIAGNOSIS — J449 Chronic obstructive pulmonary disease, unspecified: Secondary | ICD-10-CM | POA: Diagnosis not present

## 2019-06-10 DIAGNOSIS — I10 Essential (primary) hypertension: Secondary | ICD-10-CM | POA: Diagnosis not present

## 2019-06-11 DIAGNOSIS — B20 Human immunodeficiency virus [HIV] disease: Secondary | ICD-10-CM | POA: Diagnosis not present

## 2019-06-11 DIAGNOSIS — J449 Chronic obstructive pulmonary disease, unspecified: Secondary | ICD-10-CM | POA: Diagnosis not present

## 2019-06-11 DIAGNOSIS — I10 Essential (primary) hypertension: Secondary | ICD-10-CM | POA: Diagnosis not present

## 2019-06-12 DIAGNOSIS — J449 Chronic obstructive pulmonary disease, unspecified: Secondary | ICD-10-CM | POA: Diagnosis not present

## 2019-06-12 DIAGNOSIS — I10 Essential (primary) hypertension: Secondary | ICD-10-CM | POA: Diagnosis not present

## 2019-06-12 DIAGNOSIS — B20 Human immunodeficiency virus [HIV] disease: Secondary | ICD-10-CM | POA: Diagnosis not present

## 2019-06-13 DIAGNOSIS — B20 Human immunodeficiency virus [HIV] disease: Secondary | ICD-10-CM | POA: Diagnosis not present

## 2019-06-13 DIAGNOSIS — I10 Essential (primary) hypertension: Secondary | ICD-10-CM | POA: Diagnosis not present

## 2019-06-13 DIAGNOSIS — J449 Chronic obstructive pulmonary disease, unspecified: Secondary | ICD-10-CM | POA: Diagnosis not present

## 2019-06-14 DIAGNOSIS — I10 Essential (primary) hypertension: Secondary | ICD-10-CM | POA: Diagnosis not present

## 2019-06-14 DIAGNOSIS — B20 Human immunodeficiency virus [HIV] disease: Secondary | ICD-10-CM | POA: Diagnosis not present

## 2019-06-14 DIAGNOSIS — J449 Chronic obstructive pulmonary disease, unspecified: Secondary | ICD-10-CM | POA: Diagnosis not present

## 2019-06-15 DIAGNOSIS — B20 Human immunodeficiency virus [HIV] disease: Secondary | ICD-10-CM | POA: Diagnosis not present

## 2019-06-15 DIAGNOSIS — I10 Essential (primary) hypertension: Secondary | ICD-10-CM | POA: Diagnosis not present

## 2019-06-15 DIAGNOSIS — J449 Chronic obstructive pulmonary disease, unspecified: Secondary | ICD-10-CM | POA: Diagnosis not present

## 2019-06-16 DIAGNOSIS — I10 Essential (primary) hypertension: Secondary | ICD-10-CM | POA: Diagnosis not present

## 2019-06-16 DIAGNOSIS — B20 Human immunodeficiency virus [HIV] disease: Secondary | ICD-10-CM | POA: Diagnosis not present

## 2019-06-16 DIAGNOSIS — J449 Chronic obstructive pulmonary disease, unspecified: Secondary | ICD-10-CM | POA: Diagnosis not present

## 2019-06-17 ENCOUNTER — Other Ambulatory Visit: Payer: Self-pay | Admitting: *Deleted

## 2019-06-17 DIAGNOSIS — J449 Chronic obstructive pulmonary disease, unspecified: Secondary | ICD-10-CM | POA: Diagnosis not present

## 2019-06-17 DIAGNOSIS — B20 Human immunodeficiency virus [HIV] disease: Secondary | ICD-10-CM | POA: Diagnosis not present

## 2019-06-17 DIAGNOSIS — I10 Essential (primary) hypertension: Secondary | ICD-10-CM | POA: Diagnosis not present

## 2019-06-17 DIAGNOSIS — R634 Abnormal weight loss: Secondary | ICD-10-CM

## 2019-06-17 MED ORDER — ENSURE PO LIQD: 237.0000 mL | Freq: Three times a day (TID) | ORAL | 5 refills | Status: DC

## 2019-06-18 DIAGNOSIS — I10 Essential (primary) hypertension: Secondary | ICD-10-CM | POA: Diagnosis not present

## 2019-06-18 DIAGNOSIS — J449 Chronic obstructive pulmonary disease, unspecified: Secondary | ICD-10-CM | POA: Diagnosis not present

## 2019-06-18 DIAGNOSIS — B20 Human immunodeficiency virus [HIV] disease: Secondary | ICD-10-CM | POA: Diagnosis not present

## 2019-06-19 DIAGNOSIS — I10 Essential (primary) hypertension: Secondary | ICD-10-CM | POA: Diagnosis not present

## 2019-06-19 DIAGNOSIS — B20 Human immunodeficiency virus [HIV] disease: Secondary | ICD-10-CM | POA: Diagnosis not present

## 2019-06-19 DIAGNOSIS — J449 Chronic obstructive pulmonary disease, unspecified: Secondary | ICD-10-CM | POA: Diagnosis not present

## 2019-06-19 NOTE — Addendum Note (Signed)
Addended by: Eugenia Mcalpine on: 06/19/2019 09:03 AM   Modules accepted: Orders

## 2019-06-20 DIAGNOSIS — B20 Human immunodeficiency virus [HIV] disease: Secondary | ICD-10-CM | POA: Diagnosis not present

## 2019-06-20 DIAGNOSIS — J449 Chronic obstructive pulmonary disease, unspecified: Secondary | ICD-10-CM | POA: Diagnosis not present

## 2019-06-20 DIAGNOSIS — I10 Essential (primary) hypertension: Secondary | ICD-10-CM | POA: Diagnosis not present

## 2019-06-21 DIAGNOSIS — B20 Human immunodeficiency virus [HIV] disease: Secondary | ICD-10-CM | POA: Diagnosis not present

## 2019-06-21 DIAGNOSIS — I10 Essential (primary) hypertension: Secondary | ICD-10-CM | POA: Diagnosis not present

## 2019-06-21 DIAGNOSIS — J449 Chronic obstructive pulmonary disease, unspecified: Secondary | ICD-10-CM | POA: Diagnosis not present

## 2019-06-22 DIAGNOSIS — I10 Essential (primary) hypertension: Secondary | ICD-10-CM | POA: Diagnosis not present

## 2019-06-22 DIAGNOSIS — J449 Chronic obstructive pulmonary disease, unspecified: Secondary | ICD-10-CM | POA: Diagnosis not present

## 2019-06-22 DIAGNOSIS — B20 Human immunodeficiency virus [HIV] disease: Secondary | ICD-10-CM | POA: Diagnosis not present

## 2019-06-23 DIAGNOSIS — J449 Chronic obstructive pulmonary disease, unspecified: Secondary | ICD-10-CM | POA: Diagnosis not present

## 2019-06-23 DIAGNOSIS — B20 Human immunodeficiency virus [HIV] disease: Secondary | ICD-10-CM | POA: Diagnosis not present

## 2019-06-23 DIAGNOSIS — I10 Essential (primary) hypertension: Secondary | ICD-10-CM | POA: Diagnosis not present

## 2019-06-24 DIAGNOSIS — J449 Chronic obstructive pulmonary disease, unspecified: Secondary | ICD-10-CM | POA: Diagnosis not present

## 2019-06-24 DIAGNOSIS — B20 Human immunodeficiency virus [HIV] disease: Secondary | ICD-10-CM | POA: Diagnosis not present

## 2019-06-24 DIAGNOSIS — I10 Essential (primary) hypertension: Secondary | ICD-10-CM | POA: Diagnosis not present

## 2019-06-25 DIAGNOSIS — I10 Essential (primary) hypertension: Secondary | ICD-10-CM | POA: Diagnosis not present

## 2019-06-25 DIAGNOSIS — J449 Chronic obstructive pulmonary disease, unspecified: Secondary | ICD-10-CM | POA: Diagnosis not present

## 2019-06-25 DIAGNOSIS — B20 Human immunodeficiency virus [HIV] disease: Secondary | ICD-10-CM | POA: Diagnosis not present

## 2019-06-25 MED FILL — BIKTARVY 50-200-25 MG TABS: 50-200-25 | 30 days supply | Qty: 30 | Fill #2

## 2019-06-26 DIAGNOSIS — B20 Human immunodeficiency virus [HIV] disease: Secondary | ICD-10-CM | POA: Diagnosis not present

## 2019-06-26 DIAGNOSIS — J449 Chronic obstructive pulmonary disease, unspecified: Secondary | ICD-10-CM | POA: Diagnosis not present

## 2019-06-26 DIAGNOSIS — I10 Essential (primary) hypertension: Secondary | ICD-10-CM | POA: Diagnosis not present

## 2019-06-27 DIAGNOSIS — B20 Human immunodeficiency virus [HIV] disease: Secondary | ICD-10-CM | POA: Diagnosis not present

## 2019-06-27 DIAGNOSIS — I10 Essential (primary) hypertension: Secondary | ICD-10-CM | POA: Diagnosis not present

## 2019-06-27 DIAGNOSIS — J449 Chronic obstructive pulmonary disease, unspecified: Secondary | ICD-10-CM | POA: Diagnosis not present

## 2019-06-28 ENCOUNTER — Other Ambulatory Visit: Payer: Self-pay | Admitting: Internal Medicine

## 2019-06-28 DIAGNOSIS — J449 Chronic obstructive pulmonary disease, unspecified: Secondary | ICD-10-CM | POA: Diagnosis not present

## 2019-06-28 DIAGNOSIS — B20 Human immunodeficiency virus [HIV] disease: Secondary | ICD-10-CM | POA: Diagnosis not present

## 2019-06-28 DIAGNOSIS — I10 Essential (primary) hypertension: Secondary | ICD-10-CM | POA: Diagnosis not present

## 2019-06-29 DIAGNOSIS — J449 Chronic obstructive pulmonary disease, unspecified: Secondary | ICD-10-CM | POA: Diagnosis not present

## 2019-06-29 DIAGNOSIS — I10 Essential (primary) hypertension: Secondary | ICD-10-CM | POA: Diagnosis not present

## 2019-06-29 DIAGNOSIS — B20 Human immunodeficiency virus [HIV] disease: Secondary | ICD-10-CM | POA: Diagnosis not present

## 2019-06-30 ENCOUNTER — Other Ambulatory Visit: Payer: Self-pay | Admitting: *Deleted

## 2019-06-30 DIAGNOSIS — B20 Human immunodeficiency virus [HIV] disease: Secondary | ICD-10-CM | POA: Diagnosis not present

## 2019-06-30 DIAGNOSIS — J449 Chronic obstructive pulmonary disease, unspecified: Secondary | ICD-10-CM | POA: Diagnosis not present

## 2019-06-30 DIAGNOSIS — I10 Essential (primary) hypertension: Secondary | ICD-10-CM | POA: Diagnosis not present

## 2019-06-30 MED ORDER — BUDESONIDE-FORMOTEROL FUMARATE 80-4.5 MCG/ACT IN AERO: 2.0000 | INHALATION_SPRAY | Freq: Two times a day (BID) | RESPIRATORY_TRACT | 1 refills | Status: DC

## 2019-07-01 DIAGNOSIS — J449 Chronic obstructive pulmonary disease, unspecified: Secondary | ICD-10-CM | POA: Diagnosis not present

## 2019-07-01 DIAGNOSIS — I10 Essential (primary) hypertension: Secondary | ICD-10-CM | POA: Diagnosis not present

## 2019-07-01 DIAGNOSIS — B20 Human immunodeficiency virus [HIV] disease: Secondary | ICD-10-CM | POA: Diagnosis not present

## 2019-07-02 DIAGNOSIS — I10 Essential (primary) hypertension: Secondary | ICD-10-CM | POA: Diagnosis not present

## 2019-07-02 DIAGNOSIS — B20 Human immunodeficiency virus [HIV] disease: Secondary | ICD-10-CM | POA: Diagnosis not present

## 2019-07-02 DIAGNOSIS — J449 Chronic obstructive pulmonary disease, unspecified: Secondary | ICD-10-CM | POA: Diagnosis not present

## 2019-07-03 DIAGNOSIS — B20 Human immunodeficiency virus [HIV] disease: Secondary | ICD-10-CM | POA: Diagnosis not present

## 2019-07-03 DIAGNOSIS — J449 Chronic obstructive pulmonary disease, unspecified: Secondary | ICD-10-CM | POA: Diagnosis not present

## 2019-07-03 DIAGNOSIS — I10 Essential (primary) hypertension: Secondary | ICD-10-CM | POA: Diagnosis not present

## 2019-07-04 DIAGNOSIS — I10 Essential (primary) hypertension: Secondary | ICD-10-CM | POA: Diagnosis not present

## 2019-07-04 DIAGNOSIS — J449 Chronic obstructive pulmonary disease, unspecified: Secondary | ICD-10-CM | POA: Diagnosis not present

## 2019-07-04 DIAGNOSIS — B20 Human immunodeficiency virus [HIV] disease: Secondary | ICD-10-CM | POA: Diagnosis not present

## 2019-07-05 DIAGNOSIS — I10 Essential (primary) hypertension: Secondary | ICD-10-CM | POA: Diagnosis not present

## 2019-07-05 DIAGNOSIS — B20 Human immunodeficiency virus [HIV] disease: Secondary | ICD-10-CM | POA: Diagnosis not present

## 2019-07-05 DIAGNOSIS — J449 Chronic obstructive pulmonary disease, unspecified: Secondary | ICD-10-CM | POA: Diagnosis not present

## 2019-07-06 DIAGNOSIS — B20 Human immunodeficiency virus [HIV] disease: Secondary | ICD-10-CM | POA: Diagnosis not present

## 2019-07-06 DIAGNOSIS — J449 Chronic obstructive pulmonary disease, unspecified: Secondary | ICD-10-CM | POA: Diagnosis not present

## 2019-07-06 DIAGNOSIS — I10 Essential (primary) hypertension: Secondary | ICD-10-CM | POA: Diagnosis not present

## 2019-07-07 DIAGNOSIS — I10 Essential (primary) hypertension: Secondary | ICD-10-CM | POA: Diagnosis not present

## 2019-07-07 DIAGNOSIS — J449 Chronic obstructive pulmonary disease, unspecified: Secondary | ICD-10-CM | POA: Diagnosis not present

## 2019-07-07 DIAGNOSIS — B20 Human immunodeficiency virus [HIV] disease: Secondary | ICD-10-CM | POA: Diagnosis not present

## 2019-07-08 ENCOUNTER — Ambulatory Visit (INDEPENDENT_AMBULATORY_CARE_PROVIDER_SITE_OTHER): Payer: Medicaid Other

## 2019-07-08 ENCOUNTER — Ambulatory Visit: Payer: Medicaid Other | Admitting: Internal Medicine

## 2019-07-08 ENCOUNTER — Other Ambulatory Visit: Payer: Self-pay

## 2019-07-08 ENCOUNTER — Encounter: Payer: Self-pay | Admitting: Internal Medicine

## 2019-07-08 DIAGNOSIS — R05 Cough: Secondary | ICD-10-CM | POA: Diagnosis not present

## 2019-07-08 DIAGNOSIS — J449 Chronic obstructive pulmonary disease, unspecified: Secondary | ICD-10-CM

## 2019-07-08 DIAGNOSIS — B20 Human immunodeficiency virus [HIV] disease: Secondary | ICD-10-CM | POA: Diagnosis not present

## 2019-07-08 DIAGNOSIS — K2100 Gastro-esophageal reflux disease with esophagitis, without bleeding: Secondary | ICD-10-CM

## 2019-07-08 DIAGNOSIS — I10 Essential (primary) hypertension: Secondary | ICD-10-CM | POA: Diagnosis not present

## 2019-07-08 IMAGING — DX CHEST - 2 VIEW
2 series · 2 of 2 positions shown · non-contrast
Comparison: [DATE]

CLINICAL DATA: Cough and COPD.

EXAM:
CHEST - 2 VIEW

[chest pa]
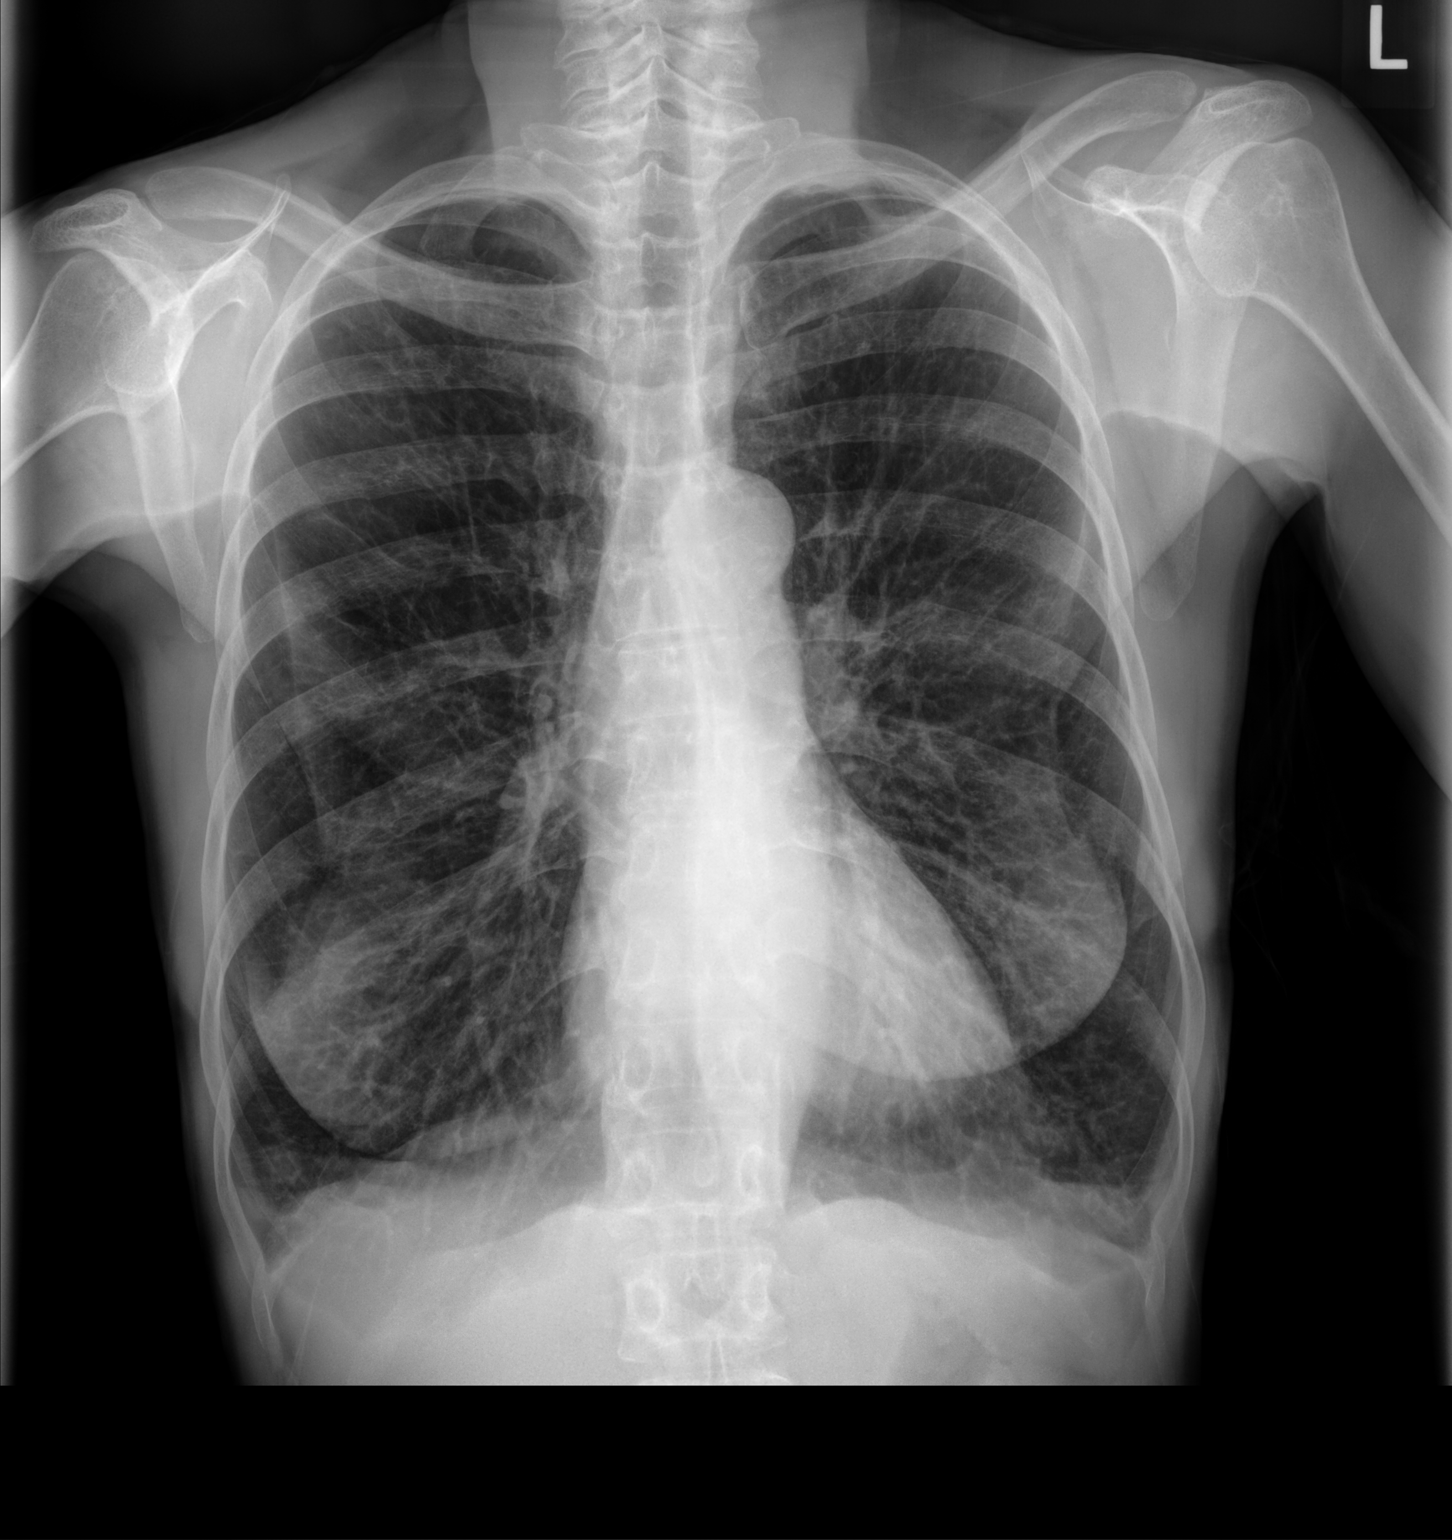

[chest lat]
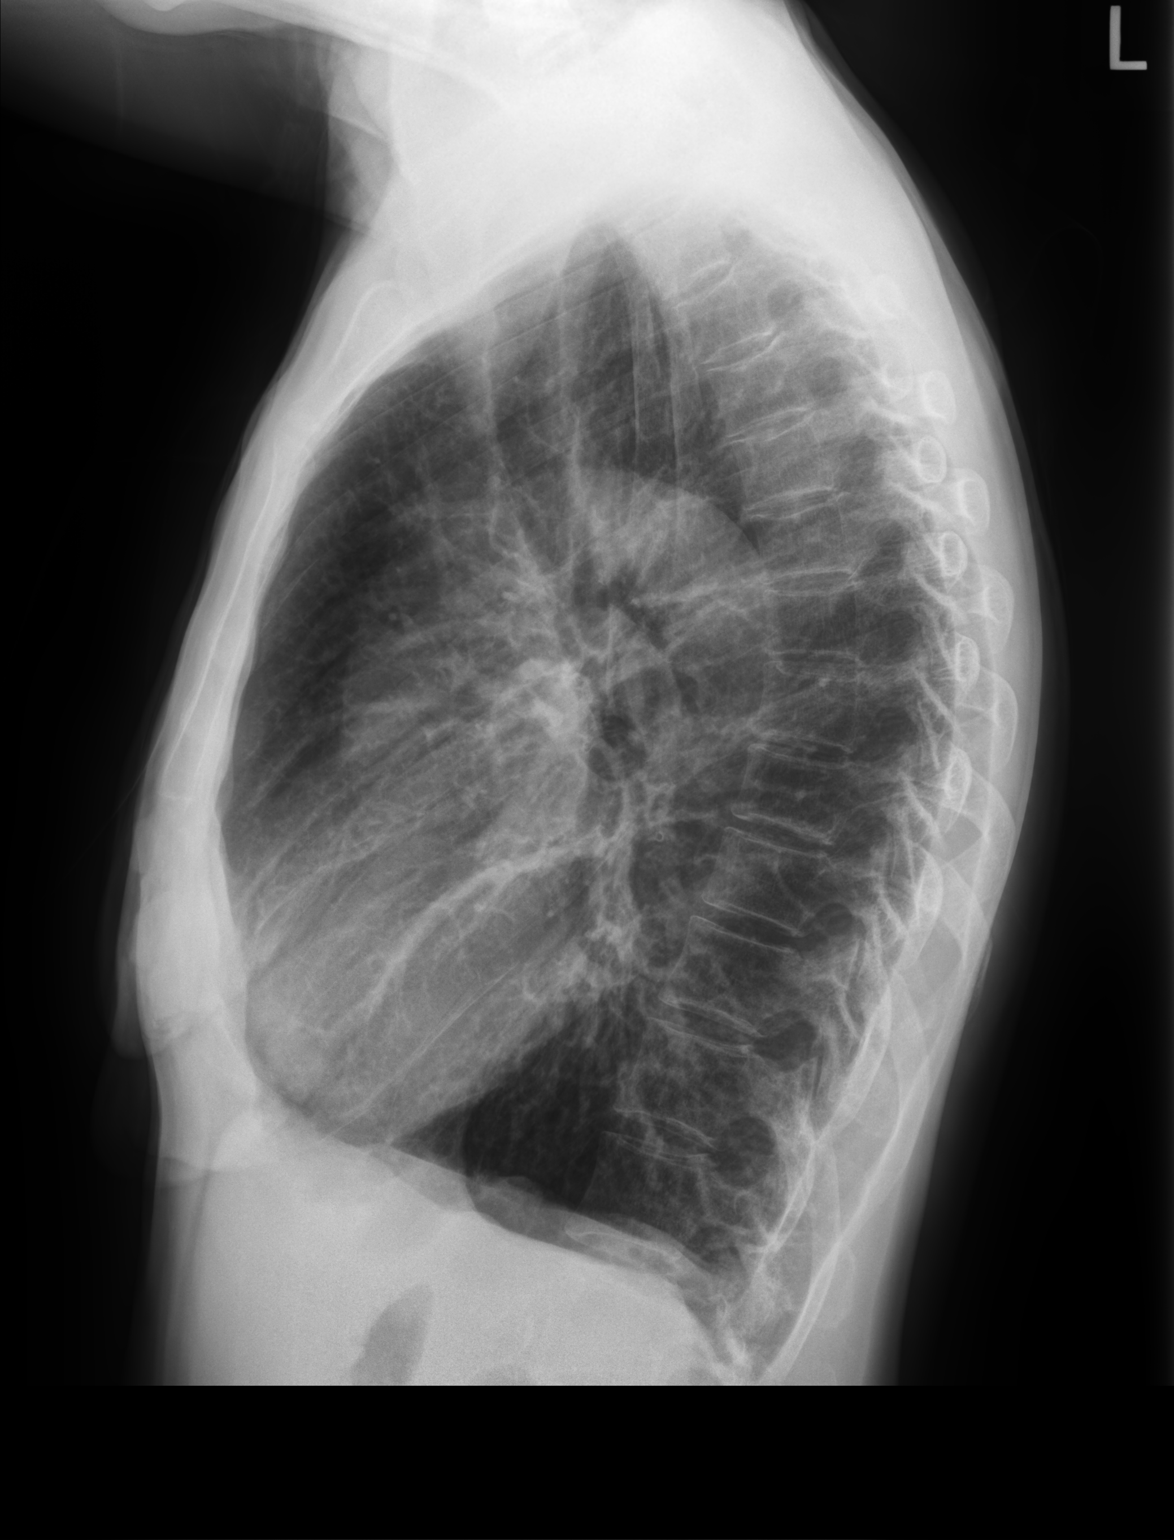

[2 of 2 positions shown; findings below may reference images not displayed]

FINDINGS: Lungs are hyperexpanded with flattening of the hemidiaphragms. No
focal airspace consolidation or effusion. Cardiomediastinal
silhouette and remainder of the exam is unchanged.
IMPRESSION: No acute cardiopulmonary disease.

COPD.

## 2019-07-08 MED ORDER — BUDESONIDE-FORMOTEROL FUMARATE 80-4.5 MCG/ACT IN AERO: 2.0000 | INHALATION_SPRAY | Freq: Two times a day (BID) | RESPIRATORY_TRACT | 0 refills | Status: DC

## 2019-07-08 MED ORDER — PREDNISONE 10 MG PO TABS: ORAL_TABLET | ORAL | 0 refills | Status: DC

## 2019-07-08 MED ORDER — OMEPRAZOLE 20 MG PO CPDR: DELAYED_RELEASE_CAPSULE | ORAL | 5 refills | Status: DC

## 2019-07-08 NOTE — Progress Notes (Signed)
Janice Williams, female    DOB: 13-Oct-1961,    MRN: [id number]   Brief patient profile:  74 yobf  HIV 2008 Quit smoking in 2014 cough / sob rx saba then symbicort which helped but still coughing so referred to pulmonary clinic 07/08/2019 by [name]      History of Present Illness  07/08/2019  Pulmonary/ 1st office eval/Delmar Dondero  Chief Complaint  Patient presents with   Pulmonary Consult    Referred by [name], NP for eval of COPD. Pt c/o cough for the past several months- occ prod with clear sputum. She uses her albuterol inhaler about 2 x per day.   Dyspnea:  MMRC3 = can't walk 100 yards even at a slow pace at a flat grade s stopping due to sob  mb to house and has to stop landing to catch here back slt uphill and 2 steps to top landing  Cough: starts a few hours p supper much worse lying > min clear mucus Sleep: p drinking water settles down able to sleep s noct or am flare  SABA use: helps  pred def helps Hb variable, uses ppi prn   No obvious day to day or daytime variability or assoc   purulent sputum or mucus plugs or hemoptysis or cp or chest tightness, subjective wheeze or overt sinus  symptoms.    . Also denies any obvious fluctuation of symptoms with weather or environmental changes or other aggravating or alleviating factors except as outlined above   No unusual exposure hx or h/o childhood pna/ asthma or knowledge of premature birth.  Current Allergies, Complete Past Medical History, Past Surgical History, Family History, and Social History were reviewed in [employer] record.  ROS  The following are not active complaints unless bolded Hoarseness, sore throat, dysphagia, dental problems, itching, sneezing,  nasal congestion or discharge of excess mucus or purulent secretions, ear ache,   fever, chills, sweats, unintended wt loss or wt gain, classically pleuritic or exertional cp,  orthopnea pnd or arm/hand swelling  or leg swelling,  presyncope, palpitations, abdominal pain, anorexia, nausea, vomiting, diarrhea  or change in bowel habits or change in bladder habits, change in stools or change in urine, dysuria, hematuria,  rash, arthralgias, visual complaints, headache, numbness, weakness or ataxia or problems with walking or coordination,  change in mood or  memory.           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 1 Inhaler 5   amLODipine (NORVASC) 10 MG tablet TAKE ONE TABLET BY MOUTH DAILY 30 tablet 2   bictegravir-emtricitabine-tenofovir AF (BIKTARVY) 50-200-25 MG TABS tablet Take 1 tablet by mouth daily. 30 tablet 4   budesonide-formoterol (SYMBICORT) 80-4.5 MCG/ACT inhaler Inhale 2 puffs into the lungs  2 (two) times daily. 10.2 g 1   dronabinol (MARINOL) 5 MG capsule Take 1 capsule (5 mg total) by mouth 2 (two) times daily before a meal. 60 capsule 3   Ensure (ENSURE) Take 237 mLs by mouth 3 (three) times daily between meals. 237 mL 5   fluticasone (FLONASE) 50 MCG/ACT nasal spray Place 2 sprays into both nostrils daily. 16 g 0   hydrocortisone (ANUSOL-HC) 2.5 % rectal cream Apply rectally 2 times daily 28.35 g 0   loratadine (CLARITIN) 10 MG tablet Take 1 tablet (10 mg total) by mouth daily. 30 tablet 2   montelukast (SINGULAIR) 10 MG tablet TAKE 1 TABLET(10 MG) BY MOUTH AT BEDTIME 30 tablet 0   omeprazole  (PRILOSEC) 20 MG capsule TAKE 1 CAPSULE BY MOUTH EVERY DAY 30 capsule 5   ondansetron (ZOFRAN) 4 MG tablet Take 1 tablet (4 mg total) by mouth every 8 (eight) hours as needed for nausea or vomiting. 20 tablet 0   polyethylene glycol (MIRALAX / GLYCOLAX) packet Take one capful in 8 oz of fluid of your choice twice a day until having soft stools, then back down to once a day.  If not having soft stools with twice a day, increased to three times a day 100 each 0   pramoxine (PROCTOFOAM) 1 % foam Place 1 application rectally 3 (three) times daily as needed. 15 g 3   pravastatin (PRAVACHOL) 20 MG tablet TAKE 1 TABLET BY MOUTH EVERY DAY 30 tablet 5   sodium chloride (OCEAN) 0.65 % SOLN nasal spray Place 2 sprays into both nostrils 3 (three) times daily. 1 Bottle 0   traMADol (ULTRAM) 50 MG tablet TAKE 1 TABLET(50 MG) BY MOUTH EVERY 12 HOURS AS NEEDED FOR PAIN 60 tablet 3   Witch Hazel (TUCKS) 50 % PADS Use as needed for rectal itching, after having BM 40 each 0      Objective:     BP 124/70 (BP Location: Left Arm, Cuff Size: Normal)    Pulse 89    Temp 98.1 F (36.7 C) (Oral)    Ht 5\' 3"  (1.6 m)    Wt 102 lb (46.3 kg)    SpO2 98%    BMI 18.07 kg/m   SpO2: 98 %    HEENT: nl dentition / oropharynx. Nl external ear canals without cough reflex -  Mild bilateral non-specific turbinate edema     NECK :  without JVD/Nodes/TM/ nl carotid upstrokes bilaterally   LUNGS: no acc muscle use,  Mild barrel  contour chest wall with bilateral  Distant bs s audible wheeze and  without cough on insp or exp maneuver and mild  Hyperresonant  to  percussion bilaterally     CV:  RRR  no s3 or murmur or increase in P2, and no edema   ABD:  soft and nontender with pos end  insp Hoover's  in the supine position. No bruits or organomegaly appreciated, bowel sounds nl  MS:   Nl gait/  ext warm without deformities, calf tenderness, cyanosis or clubbing No obvious joint restrictions   SKIN: warm and dry  without lesions    NEURO:  alert, approp, nl sensorium with  no motor or cerebellar deficits apparent.          CXR PA and Lateral:   07/08/2019 :    I personally reviewed images and  impression as follows:   Mod copd   Labs ordered/ reviewed:      Chemistry  Component Value Date/Time   NA 146 03/06/2019 1110   K 5.1 03/06/2019 1110   CL 104 03/06/2019 1110   CO2 32 03/06/2019 1110   BUN 17 03/06/2019 1110   CREATININE 1.04 03/06/2019 1110      Component Value Date/Time   CALCIUM 10.0 03/06/2019 1110   ALKPHOS 47 06/27/2017 1650   AST 18 03/06/2019 1110   ALT 7 03/06/2019 1110   BILITOT 0.4 03/06/2019 1110        Lab Results  Component Value Date   WBC 4.0 03/06/2019   HGB 12.0 03/06/2019   HCT 34.4 (L) 03/06/2019   MCV 94.5 03/06/2019   PLT 315 03/06/2019          Assessment   COPD gold ?  Quit smoking 2014  - 07/08/2019  After extensive coaching inhaler device,  effectiveness =    0% at baseline to 50% with teaching   DDX of  difficult airways management almost all start with A and  include Adherence, Ace Inhibitors, Acid Reflux, Active Sinus Disease, Alpha 1 Antitripsin deficiency, Anxiety masquerading as Airways dz,  ABPA,  Allergy(esp in young), Aspiration (esp in elderly), Adverse effects of meds,  Active smoking or vaping, A bunch of PE's (a small clot burden can't cause this syndrome unless there is already severe underlying pulm or vascular dz with poor reserve) plus two Bs  = Bronchiectasis and Beta blocker use..and one C= CHF  Adherence is always the initial "prime suspect" and is a multilayered concern that requires a "trust but verify" approach in every patient - starting with knowing how to use medications, especially inhalers, correctly, keeping up with refills and understanding the fundamental difference between maintenance and prns vs those medications only taken for a very short course and then stopped and not refilled.  - see hfa  teaching - return with all meds in hand using a trust but verify approach to confirm accurate Medication  Reconciliation The principal here is that until we are certain that the  patients are doing what we've asked, it makes no sense to ask them to do more.    ? Acid (or non-acid) GERD > always difficult to exclude as up to 75% of pts in some series report no assoc GI/ Heartburn symptoms> rec max (24h)  acid suppression and diet restrictions/ reviewed and instructions given in writing.   ? Allergy > continue singulair, Prednisone 10 mg take  4 each am x 2 days,   2 each am x 2 days,  1 each am x 2 days and stop, keep ICS low for now since HIV and on HIV meds   ? Active sinus dz > continue clariton/flonase    ? Alpha one AT def > very rare in AA's   >>> f/u in 4weeks to regroup ? Add lama?   Total time devoted to counseling  > 50 % of initial 60 min office visit:  review case with pt/ discussion of options/alternatives/Perfomred device teaching which extended face to face time for this visit  personally creating written customized instructions  in presence of pt  then going over those specific  Instructions directly with the pt including how to use all of the meds but in particular covering each new medication in detail and the difference between the maintenance= "automatic" meds and the prns using an action plan format for the latter (If this problem/symptom => do that organization reading Left to right).  Please see AVS from this visit for a  full list of these instructions which I personally wrote for this pt and  are unique to this visit.      Christinia Gully, MD 07/08/2019

## 2019-07-08 NOTE — Progress Notes (Signed)
Spoke with pt and notified of results per Dr. Wert. Pt verbalized understanding and denied any questions. 

## 2019-07-08 NOTE — Patient Instructions (Signed)
Prednisone 10 mg take  4 each am x 2 days,   2 each am x 2 days,  1 each am x 2 days and stop   Plan A = Automatic = symbicort 80 Take 2 puffs first thing in am and then another 2 puffs about 12 hours later.   Work on inhaler technique:  relax and gently blow all the way out then take a nice smooth deep breath back in, triggering the inhaler at same time you start breathing in.  Hold for up to 5 seconds if you can. Blow out thru nose. Rinse and gargle with water when done      Plan B = Backup Only use your albuterol inhaler as a rescue medication to be used if you can't catch your breath by resting or doing a relaxed purse lip breathing pattern.  - The less you use it, the better it will work when you need it. - Ok to use the inhaler up to 2 puffs  every 4 hours if you must but call for appointment if use goes up over your usual need - Don't leave home without it !!  (think of it like the spare tire for your car)   Please remember to go to the  x-ray department  for your tests - we will call you with the results when they are available    Please schedule a follow up office visit in 4 weeks, sooner if needed

## 2019-07-08 NOTE — Assessment & Plan Note (Signed)
Quit smoking 2014  - 07/08/2019  After extensive coaching inhaler device,  effectiveness =    0% at baseline to 50% with teaching   DDX of  difficult airways management almost all start with A and  include Adherence, Ace Inhibitors, Acid Reflux, Active Sinus Disease, Alpha 1 Antitripsin deficiency, Anxiety masquerading as Airways dz,  ABPA,  Allergy(esp in young), Aspiration (esp in elderly), Adverse effects of meds,  Active smoking or vaping, A bunch of PE's (a small clot burden can't cause this syndrome unless there is already severe underlying pulm or vascular dz with poor reserve) plus two Bs  = Bronchiectasis and Beta blocker use..and one C= CHF  Adherence is always the initial "prime suspect" and is a multilayered concern that requires a "trust but verify" approach in every patient - starting with knowing how to use medications, especially inhalers, correctly, keeping up with refills and understanding the fundamental difference between maintenance and prns vs those medications only taken for a very short course and then stopped and not refilled.  - see hfa teaching - return with all meds in hand using a trust but verify approach to confirm accurate Medication  Reconciliation The principal here is that until we are certain that the  patients are doing what we've asked, it makes no sense to ask them to do more.    ? Acid (or non-acid) GERD > always difficult to exclude as up to 75% of pts in some series report no assoc GI/ Heartburn symptoms> rec max (24h)  acid suppression and diet restrictions/ reviewed and instructions given in writing.   ? Allergy > continue singulair, Prednisone 10 mg take  4 each am x 2 days,   2 each am x 2 days,  1 each am x 2 days and stop, keep ICS low for now since HIV and on HIV meds   ? Active sinus dz > continue clariton/flonase    ? Alpha one AT def > very rare in AA's   >>> f/u in 4weeks to regroup ? Add lama?   Total time devoted to counseling  > 50 % of  initial 60 min office visit:  review case with pt/ discussion of options/alternatives/Perfomred device teaching which extended face to face time for this visit  personally creating written customized instructions  in presence of pt  then going over those specific  Instructions directly with the pt including how to use all of the meds but in particular covering each new medication in detail and the difference between the maintenance= "automatic" meds and the prns using an action plan format for the latter (If this problem/symptom => do that organization reading Left to right).  Please see AVS from this visit for a full list of these instructions which I personally wrote for this pt and  are unique to this visit.

## 2019-07-09 DIAGNOSIS — I10 Essential (primary) hypertension: Secondary | ICD-10-CM | POA: Diagnosis not present

## 2019-07-09 DIAGNOSIS — J449 Chronic obstructive pulmonary disease, unspecified: Secondary | ICD-10-CM | POA: Diagnosis not present

## 2019-07-09 DIAGNOSIS — B20 Human immunodeficiency virus [HIV] disease: Secondary | ICD-10-CM | POA: Diagnosis not present

## 2019-07-10 ENCOUNTER — Other Ambulatory Visit: Payer: Self-pay | Admitting: Internal Medicine

## 2019-07-10 DIAGNOSIS — I10 Essential (primary) hypertension: Secondary | ICD-10-CM | POA: Diagnosis not present

## 2019-07-10 DIAGNOSIS — J449 Chronic obstructive pulmonary disease, unspecified: Secondary | ICD-10-CM | POA: Diagnosis not present

## 2019-07-10 DIAGNOSIS — Z1231 Encounter for screening mammogram for malignant neoplasm of breast: Secondary | ICD-10-CM

## 2019-07-10 DIAGNOSIS — B20 Human immunodeficiency virus [HIV] disease: Secondary | ICD-10-CM | POA: Diagnosis not present

## 2019-07-11 DIAGNOSIS — I10 Essential (primary) hypertension: Secondary | ICD-10-CM | POA: Diagnosis not present

## 2019-07-11 DIAGNOSIS — J449 Chronic obstructive pulmonary disease, unspecified: Secondary | ICD-10-CM | POA: Diagnosis not present

## 2019-07-11 DIAGNOSIS — B20 Human immunodeficiency virus [HIV] disease: Secondary | ICD-10-CM | POA: Diagnosis not present

## 2019-07-12 DIAGNOSIS — B20 Human immunodeficiency virus [HIV] disease: Secondary | ICD-10-CM | POA: Diagnosis not present

## 2019-07-12 DIAGNOSIS — I10 Essential (primary) hypertension: Secondary | ICD-10-CM | POA: Diagnosis not present

## 2019-07-12 DIAGNOSIS — J449 Chronic obstructive pulmonary disease, unspecified: Secondary | ICD-10-CM | POA: Diagnosis not present

## 2019-07-13 DIAGNOSIS — B20 Human immunodeficiency virus [HIV] disease: Secondary | ICD-10-CM | POA: Diagnosis not present

## 2019-07-13 DIAGNOSIS — I10 Essential (primary) hypertension: Secondary | ICD-10-CM | POA: Diagnosis not present

## 2019-07-13 DIAGNOSIS — J449 Chronic obstructive pulmonary disease, unspecified: Secondary | ICD-10-CM | POA: Diagnosis not present

## 2019-07-14 DIAGNOSIS — J449 Chronic obstructive pulmonary disease, unspecified: Secondary | ICD-10-CM | POA: Diagnosis not present

## 2019-07-14 DIAGNOSIS — B20 Human immunodeficiency virus [HIV] disease: Secondary | ICD-10-CM | POA: Diagnosis not present

## 2019-07-14 DIAGNOSIS — I10 Essential (primary) hypertension: Secondary | ICD-10-CM | POA: Diagnosis not present

## 2019-07-15 DIAGNOSIS — B20 Human immunodeficiency virus [HIV] disease: Secondary | ICD-10-CM | POA: Diagnosis not present

## 2019-07-15 DIAGNOSIS — I10 Essential (primary) hypertension: Secondary | ICD-10-CM | POA: Diagnosis not present

## 2019-07-15 DIAGNOSIS — J449 Chronic obstructive pulmonary disease, unspecified: Secondary | ICD-10-CM | POA: Diagnosis not present

## 2019-07-16 DIAGNOSIS — J449 Chronic obstructive pulmonary disease, unspecified: Secondary | ICD-10-CM | POA: Diagnosis not present

## 2019-07-16 DIAGNOSIS — I10 Essential (primary) hypertension: Secondary | ICD-10-CM | POA: Diagnosis not present

## 2019-07-16 DIAGNOSIS — B20 Human immunodeficiency virus [HIV] disease: Secondary | ICD-10-CM | POA: Diagnosis not present

## 2019-07-17 DIAGNOSIS — B20 Human immunodeficiency virus [HIV] disease: Secondary | ICD-10-CM | POA: Diagnosis not present

## 2019-07-17 DIAGNOSIS — I10 Essential (primary) hypertension: Secondary | ICD-10-CM | POA: Diagnosis not present

## 2019-07-17 DIAGNOSIS — J449 Chronic obstructive pulmonary disease, unspecified: Secondary | ICD-10-CM | POA: Diagnosis not present

## 2019-07-18 DIAGNOSIS — I10 Essential (primary) hypertension: Secondary | ICD-10-CM | POA: Diagnosis not present

## 2019-07-18 DIAGNOSIS — J449 Chronic obstructive pulmonary disease, unspecified: Secondary | ICD-10-CM | POA: Diagnosis not present

## 2019-07-18 DIAGNOSIS — B20 Human immunodeficiency virus [HIV] disease: Secondary | ICD-10-CM | POA: Diagnosis not present

## 2019-07-19 DIAGNOSIS — J449 Chronic obstructive pulmonary disease, unspecified: Secondary | ICD-10-CM | POA: Diagnosis not present

## 2019-07-19 DIAGNOSIS — B20 Human immunodeficiency virus [HIV] disease: Secondary | ICD-10-CM | POA: Diagnosis not present

## 2019-07-19 DIAGNOSIS — I10 Essential (primary) hypertension: Secondary | ICD-10-CM | POA: Diagnosis not present

## 2019-07-20 DIAGNOSIS — B20 Human immunodeficiency virus [HIV] disease: Secondary | ICD-10-CM | POA: Diagnosis not present

## 2019-07-20 DIAGNOSIS — I10 Essential (primary) hypertension: Secondary | ICD-10-CM | POA: Diagnosis not present

## 2019-07-20 DIAGNOSIS — J449 Chronic obstructive pulmonary disease, unspecified: Secondary | ICD-10-CM | POA: Diagnosis not present

## 2019-07-21 ENCOUNTER — Other Ambulatory Visit: Payer: Self-pay | Admitting: Internal Medicine

## 2019-07-21 DIAGNOSIS — B20 Human immunodeficiency virus [HIV] disease: Secondary | ICD-10-CM | POA: Diagnosis not present

## 2019-07-21 DIAGNOSIS — I10 Essential (primary) hypertension: Secondary | ICD-10-CM

## 2019-07-21 DIAGNOSIS — J449 Chronic obstructive pulmonary disease, unspecified: Secondary | ICD-10-CM | POA: Diagnosis not present

## 2019-07-22 DIAGNOSIS — I10 Essential (primary) hypertension: Secondary | ICD-10-CM | POA: Diagnosis not present

## 2019-07-22 DIAGNOSIS — B20 Human immunodeficiency virus [HIV] disease: Secondary | ICD-10-CM | POA: Diagnosis not present

## 2019-07-22 DIAGNOSIS — J449 Chronic obstructive pulmonary disease, unspecified: Secondary | ICD-10-CM | POA: Diagnosis not present

## 2019-07-23 DIAGNOSIS — I10 Essential (primary) hypertension: Secondary | ICD-10-CM | POA: Diagnosis not present

## 2019-07-23 DIAGNOSIS — B20 Human immunodeficiency virus [HIV] disease: Secondary | ICD-10-CM | POA: Diagnosis not present

## 2019-07-23 DIAGNOSIS — J449 Chronic obstructive pulmonary disease, unspecified: Secondary | ICD-10-CM | POA: Diagnosis not present

## 2019-07-24 DIAGNOSIS — B20 Human immunodeficiency virus [HIV] disease: Secondary | ICD-10-CM | POA: Diagnosis not present

## 2019-07-24 DIAGNOSIS — J449 Chronic obstructive pulmonary disease, unspecified: Secondary | ICD-10-CM | POA: Diagnosis not present

## 2019-07-24 DIAGNOSIS — I10 Essential (primary) hypertension: Secondary | ICD-10-CM | POA: Diagnosis not present

## 2019-07-25 DIAGNOSIS — I10 Essential (primary) hypertension: Secondary | ICD-10-CM | POA: Diagnosis not present

## 2019-07-25 DIAGNOSIS — J449 Chronic obstructive pulmonary disease, unspecified: Secondary | ICD-10-CM | POA: Diagnosis not present

## 2019-07-25 DIAGNOSIS — B20 Human immunodeficiency virus [HIV] disease: Secondary | ICD-10-CM | POA: Diagnosis not present

## 2019-07-26 DIAGNOSIS — I10 Essential (primary) hypertension: Secondary | ICD-10-CM | POA: Diagnosis not present

## 2019-07-26 DIAGNOSIS — B20 Human immunodeficiency virus [HIV] disease: Secondary | ICD-10-CM | POA: Diagnosis not present

## 2019-07-26 DIAGNOSIS — J449 Chronic obstructive pulmonary disease, unspecified: Secondary | ICD-10-CM | POA: Diagnosis not present

## 2019-07-27 DIAGNOSIS — J449 Chronic obstructive pulmonary disease, unspecified: Secondary | ICD-10-CM | POA: Diagnosis not present

## 2019-07-27 DIAGNOSIS — B20 Human immunodeficiency virus [HIV] disease: Secondary | ICD-10-CM | POA: Diagnosis not present

## 2019-07-27 DIAGNOSIS — I10 Essential (primary) hypertension: Secondary | ICD-10-CM | POA: Diagnosis not present

## 2019-07-28 DIAGNOSIS — I10 Essential (primary) hypertension: Secondary | ICD-10-CM | POA: Diagnosis not present

## 2019-07-28 DIAGNOSIS — B20 Human immunodeficiency virus [HIV] disease: Secondary | ICD-10-CM | POA: Diagnosis not present

## 2019-07-28 DIAGNOSIS — J449 Chronic obstructive pulmonary disease, unspecified: Secondary | ICD-10-CM | POA: Diagnosis not present

## 2019-07-28 MED FILL — BIKTARVY 50-200-25 MG TABS: 50-200-25 | 30 days supply | Qty: 30 | Fill #0

## 2019-07-29 DIAGNOSIS — I10 Essential (primary) hypertension: Secondary | ICD-10-CM | POA: Diagnosis not present

## 2019-07-29 DIAGNOSIS — B20 Human immunodeficiency virus [HIV] disease: Secondary | ICD-10-CM | POA: Diagnosis not present

## 2019-07-29 DIAGNOSIS — J449 Chronic obstructive pulmonary disease, unspecified: Secondary | ICD-10-CM | POA: Diagnosis not present

## 2019-07-30 DIAGNOSIS — I10 Essential (primary) hypertension: Secondary | ICD-10-CM | POA: Diagnosis not present

## 2019-07-30 DIAGNOSIS — J449 Chronic obstructive pulmonary disease, unspecified: Secondary | ICD-10-CM | POA: Diagnosis not present

## 2019-07-30 DIAGNOSIS — B20 Human immunodeficiency virus [HIV] disease: Secondary | ICD-10-CM | POA: Diagnosis not present

## 2019-07-31 DIAGNOSIS — I10 Essential (primary) hypertension: Secondary | ICD-10-CM | POA: Diagnosis not present

## 2019-07-31 DIAGNOSIS — J449 Chronic obstructive pulmonary disease, unspecified: Secondary | ICD-10-CM | POA: Diagnosis not present

## 2019-07-31 DIAGNOSIS — B20 Human immunodeficiency virus [HIV] disease: Secondary | ICD-10-CM | POA: Diagnosis not present

## 2019-08-01 ENCOUNTER — Other Ambulatory Visit: Payer: Self-pay | Admitting: Internal Medicine

## 2019-08-01 DIAGNOSIS — I10 Essential (primary) hypertension: Secondary | ICD-10-CM | POA: Diagnosis not present

## 2019-08-01 DIAGNOSIS — J449 Chronic obstructive pulmonary disease, unspecified: Secondary | ICD-10-CM | POA: Diagnosis not present

## 2019-08-01 DIAGNOSIS — B20 Human immunodeficiency virus [HIV] disease: Secondary | ICD-10-CM | POA: Diagnosis not present

## 2019-08-02 DIAGNOSIS — I10 Essential (primary) hypertension: Secondary | ICD-10-CM | POA: Diagnosis not present

## 2019-08-02 DIAGNOSIS — B20 Human immunodeficiency virus [HIV] disease: Secondary | ICD-10-CM | POA: Diagnosis not present

## 2019-08-02 DIAGNOSIS — J449 Chronic obstructive pulmonary disease, unspecified: Secondary | ICD-10-CM | POA: Diagnosis not present

## 2019-08-03 DIAGNOSIS — J449 Chronic obstructive pulmonary disease, unspecified: Secondary | ICD-10-CM | POA: Diagnosis not present

## 2019-08-03 DIAGNOSIS — I10 Essential (primary) hypertension: Secondary | ICD-10-CM | POA: Diagnosis not present

## 2019-08-03 DIAGNOSIS — B20 Human immunodeficiency virus [HIV] disease: Secondary | ICD-10-CM | POA: Diagnosis not present

## 2019-08-04 DIAGNOSIS — I10 Essential (primary) hypertension: Secondary | ICD-10-CM | POA: Diagnosis not present

## 2019-08-04 DIAGNOSIS — J449 Chronic obstructive pulmonary disease, unspecified: Secondary | ICD-10-CM | POA: Diagnosis not present

## 2019-08-04 DIAGNOSIS — B20 Human immunodeficiency virus [HIV] disease: Secondary | ICD-10-CM | POA: Diagnosis not present

## 2019-08-05 ENCOUNTER — Ambulatory Visit: Payer: Medicaid Other | Admitting: Internal Medicine

## 2019-08-05 DIAGNOSIS — I10 Essential (primary) hypertension: Secondary | ICD-10-CM | POA: Diagnosis not present

## 2019-08-05 DIAGNOSIS — B20 Human immunodeficiency virus [HIV] disease: Secondary | ICD-10-CM | POA: Diagnosis not present

## 2019-08-05 DIAGNOSIS — J449 Chronic obstructive pulmonary disease, unspecified: Secondary | ICD-10-CM | POA: Diagnosis not present

## 2019-08-06 DIAGNOSIS — I10 Essential (primary) hypertension: Secondary | ICD-10-CM | POA: Diagnosis not present

## 2019-08-06 DIAGNOSIS — B20 Human immunodeficiency virus [HIV] disease: Secondary | ICD-10-CM | POA: Diagnosis not present

## 2019-08-06 DIAGNOSIS — J449 Chronic obstructive pulmonary disease, unspecified: Secondary | ICD-10-CM | POA: Diagnosis not present

## 2019-08-07 DIAGNOSIS — B20 Human immunodeficiency virus [HIV] disease: Secondary | ICD-10-CM | POA: Diagnosis not present

## 2019-08-07 DIAGNOSIS — I10 Essential (primary) hypertension: Secondary | ICD-10-CM | POA: Diagnosis not present

## 2019-08-07 DIAGNOSIS — J449 Chronic obstructive pulmonary disease, unspecified: Secondary | ICD-10-CM | POA: Diagnosis not present

## 2019-08-08 DIAGNOSIS — I10 Essential (primary) hypertension: Secondary | ICD-10-CM | POA: Diagnosis not present

## 2019-08-08 DIAGNOSIS — J449 Chronic obstructive pulmonary disease, unspecified: Secondary | ICD-10-CM | POA: Diagnosis not present

## 2019-08-08 DIAGNOSIS — B20 Human immunodeficiency virus [HIV] disease: Secondary | ICD-10-CM | POA: Diagnosis not present

## 2019-08-09 DIAGNOSIS — B20 Human immunodeficiency virus [HIV] disease: Secondary | ICD-10-CM | POA: Diagnosis not present

## 2019-08-09 DIAGNOSIS — J449 Chronic obstructive pulmonary disease, unspecified: Secondary | ICD-10-CM | POA: Diagnosis not present

## 2019-08-09 DIAGNOSIS — I10 Essential (primary) hypertension: Secondary | ICD-10-CM | POA: Diagnosis not present

## 2019-08-10 DIAGNOSIS — I10 Essential (primary) hypertension: Secondary | ICD-10-CM | POA: Diagnosis not present

## 2019-08-10 DIAGNOSIS — B20 Human immunodeficiency virus [HIV] disease: Secondary | ICD-10-CM | POA: Diagnosis not present

## 2019-08-10 DIAGNOSIS — J449 Chronic obstructive pulmonary disease, unspecified: Secondary | ICD-10-CM | POA: Diagnosis not present

## 2019-08-11 ENCOUNTER — Encounter: Payer: Self-pay | Admitting: Internal Medicine

## 2019-08-11 ENCOUNTER — Other Ambulatory Visit: Payer: Self-pay

## 2019-08-11 ENCOUNTER — Ambulatory Visit (INDEPENDENT_AMBULATORY_CARE_PROVIDER_SITE_OTHER): Payer: Medicaid Other | Admitting: Internal Medicine

## 2019-08-11 DIAGNOSIS — J449 Chronic obstructive pulmonary disease, unspecified: Secondary | ICD-10-CM | POA: Diagnosis not present

## 2019-08-11 DIAGNOSIS — B20 Human immunodeficiency virus [HIV] disease: Secondary | ICD-10-CM | POA: Diagnosis not present

## 2019-08-11 DIAGNOSIS — I10 Essential (primary) hypertension: Secondary | ICD-10-CM | POA: Diagnosis not present

## 2019-08-11 MED ORDER — BUDESONIDE-FORMOTEROL FUMARATE 80-4.5 MCG/ACT IN AERO: INHALATION_SPRAY | RESPIRATORY_TRACT | 11 refills | Status: DC

## 2019-08-11 MED ORDER — BUDESONIDE-FORMOTEROL FUMARATE 80-4.5 MCG/ACT IN AERO: 2.0000 | INHALATION_SPRAY | Freq: Two times a day (BID) | RESPIRATORY_TRACT | 0 refills | Status: DC

## 2019-08-11 NOTE — Patient Instructions (Signed)
No change in medications  Work on inhaler technique:  relax and gently blow all the way out then take a nice smooth deep breath back in, triggering the inhaler at same time you start breathing in.  Hold for up to 5 seconds if you can. Blow symbicort out thru nose. Rinse and gargle with water when done   Please schedule a follow up visit in 3 months but call sooner if needed in 3 months with pfts

## 2019-08-11 NOTE — Progress Notes (Signed)
Janice Williams, female    DOB: 02-May-1961,    MRN: [id number]   Brief patient profile:  44 yobf  HIV 2008 Quit smoking in 2014 cough / sob rx saba then symbicort which helped but still coughing so referred to pulmonary clinic 07/08/2019 by [name]      History of Present Illness  07/08/2019  Pulmonary/ 1st office eval/Montzerrat Brunell  Chief Complaint  Patient presents with  . Pulmonary Consult    Referred by [name], NP for eval of COPD. Pt c/o cough for the past several months- occ prod with clear sputum. She uses her albuterol inhaler about 2 x per day.   Dyspnea:  MMRC3 = can't walk 100 yards even at a slow pace at a flat grade s stopping due to sob  mb to house and has to stop landing to catch here back slt uphill and 2 steps to top landing  Cough: starts a few hours p supper much worse lying > min clear mucus Sleep: p drinking water settles down able to sleep s noct or am flare  SABA use: helps  pred def helps Hb variable, uses ppi prn  rec Prednisone 10 mg take  4 each am x 2 days,   2 each am x 2 days,  1 each am x 2 days and stop  Plan A = Automatic = symbicort 80 Take 2 puffs first thing in am and then another 2 puffs about 12 hours later.  Work on inhaler technique:    Plan B = Backup Only use your albuterol inhaler as a rescue medication   08/11/2019  f/u ov/Dua Mehler re: presumed copd / maint on symb 80 2bid though hfa poor Chief Complaint  Patient presents with  . Follow-up    Breathing is doing well and she has not used her rescue inhaler.   Dyspnea:  MMRC2 = can't walk a nl pace on a flat grade s sob but does fine slow and flat  Cough: none Sleeping: able to lie flat bed with 2 pillows  SABA use: very little     No obvious day to day or daytime variability or assoc excess/ purulent sputum or mucus plugs or hemoptysis or cp or chest tightness, subjective wheeze or overt sinus or hb symptoms.   Sleeping syb without nocturnal  or early am exacerbation  of  respiratory  c/o's or need for noct saba. Also denies any obvious fluctuation of symptoms with weather or environmental changes or other aggravating or alleviating factors except as outlined above   No unusual exposure hx or h/o childhood pna/ asthma or knowledge of premature birth.  Current Allergies, Complete Past Medical History, Past Surgical History, Family History, and Social History were reviewed in [employer] record.  ROS  The following are not active complaints unless bolded Hoarseness, sore throat, dysphagia, dental problems, itching, sneezing,  nasal congestion or discharge of excess mucus or purulent secretions, ear ache,   fever, chills, sweats, unintended wt loss or wt gain, classically pleuritic or exertional cp,  orthopnea pnd or arm/hand swelling  or leg swelling, presyncope, palpitations, abdominal pain, anorexia, nausea, vomiting, diarrhea  or change in bowel habits or change in bladder habits, change in stools or change in urine, dysuria, hematuria,  rash, arthralgias, visual complaints, headache, numbness, weakness or ataxia or problems with walking or coordination,  change in mood or  memory.        Current Meds  Medication Sig  . albuterol (PROVENTIL  HFA;VENTOLIN HFA) 108 (90 Base) MCG/ACT inhaler Inhale 2 puffs into the lungs every 6 (six) hours as needed for wheezing or shortness of breath.  [name] amLODipine (NORVASC) 10 MG tablet TAKE ONE TABLET BY MOUTH DAILY  . bictegravir-emtricitabine-tenofovir AF (BIKTARVY) 50-200-25 MG TABS tablet Take 1 tablet by mouth daily.  . budesonide-formoterol (SYMBICORT) 80-4.5 MCG/ACT inhaler Inhale 2 puffs into the lungs 2 (two) times daily.  [name] dronabinol (MARINOL) 5 MG capsule Take 1 capsule (5 mg total) by mouth 2 (two) times daily before a meal.  . Ensure (ENSURE) Take 237 mLs by mouth 3 (three) times daily between meals.  . fluticasone (FLONASE) 50 MCG/ACT nasal spray Place 2 sprays into both nostrils daily.  .  hydrocortisone (ANUSOL-HC) 2.5 % rectal cream Apply rectally 2 times daily  . loratadine (CLARITIN) 10 MG tablet Take 1 tablet (10 mg total) by mouth daily.  . montelukast (SINGULAIR) 10 MG tablet TAKE 1 TABLET(10 MG) BY MOUTH AT BEDTIME  . omeprazole (PRILOSEC) 20 MG capsule Take 30- 60 min before your first and last meals of the day  . ondansetron (ZOFRAN) 4 MG tablet Take 1 tablet (4 mg total) by mouth every 8 (eight) hours as needed for nausea or vomiting.  . polyethylene glycol (MIRALAX / GLYCOLAX) packet Take one capful in 8 oz of fluid of your choice twice a day until having soft stools, then back down to once a day.  If not having soft stools with twice a day, increased to three times a day  . pramoxine (PROCTOFOAM) 1 % foam Place 1 application rectally 3 (three) times daily as needed.  . pravastatin (PRAVACHOL) 20 MG tablet TAKE 1 TABLET BY MOUTH EVERY DAY  . sodium chloride (OCEAN) 0.65 % SOLN nasal spray Place 2 sprays into both nostrils 3 (three) times daily.  . traMADol (ULTRAM) 50 MG tablet TAKE 1 TABLET(50 MG) BY MOUTH EVERY 12 HOURS AS NEEDED FOR PAIN  . Witch Hazel (TUCKS) 50 % PADS Use as needed for rectal itching, after having BM              Past Medical History:  Diagnosis Date  . Allergy   . Anemia   . Arthritis    lower back, knees  . Bronchitis   . COPD (chronic obstructive pulmonary disease) ([place])   . Former smoker    quit 2014  . GERD (gastroesophageal reflux disease)   . HIV infection ([place])   . Hypertension   . Stroke ([place])   . 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 SVD (spontaneous vaginal delivery)    x 3       Objective:     Wt Readings from Last 3 Encounters:  08/11/19 100 lb 3.2 oz (45.5 kg)  07/08/19 102 lb (46.3 kg)  12/02/18 99 lb (44.9 kg)     Vital signs reviewed - Note on arrival 02 sats  99% on RA     HEENT : pt wearing mask not removed for exam due to covid -19 concerns.   NECK :  without JVD/Nodes/TM/ nl carotid  upstrokes bilaterally   LUNGS: no acc muscle use,  Mild barrel  contour chest wall with bilateral  Distant bs s audible wheeze and  without cough on insp or exp maneuvers  and mild  Hyperresonant  to  percussion bilaterally     CV:  RRR  no s3 or murmur or increase in P2, and no edema   ABD:  soft and nontender with pos end  insp Hoover's  in the supine position. No bruits or organomegaly appreciated, bowel sounds nl  MS:   Nl gait/  ext warm without deformities, calf tenderness, cyanosis or clubbing No obvious joint restrictions   SKIN: warm and dry without lesions    NEURO:  alert, approp, nl sensorium with  no motor or cerebellar deficits apparent.             Assessment

## 2019-08-12 ENCOUNTER — Encounter: Payer: Self-pay | Admitting: Internal Medicine

## 2019-08-12 DIAGNOSIS — J449 Chronic obstructive pulmonary disease, unspecified: Secondary | ICD-10-CM | POA: Diagnosis not present

## 2019-08-12 DIAGNOSIS — B20 Human immunodeficiency virus [HIV] disease: Secondary | ICD-10-CM | POA: Diagnosis not present

## 2019-08-12 DIAGNOSIS — I10 Essential (primary) hypertension: Secondary | ICD-10-CM | POA: Diagnosis not present

## 2019-08-12 NOTE — Assessment & Plan Note (Signed)
Quit smoking 2014  -08/11/2019  After extensive coaching inhaler device,  effectiveness =    50% from baseline 25% (short Ti) > continue symb 80 2bid   Despite very poor baseline HFA technique she is doing relatively well on low-dose Symbicort so no need to change for now.  I did review with her in detail how to use an empty device to take "practice swings"  like a golfer does before he ends up hitting the real ball.  I had an extended discussion with the patient reviewing all relevant studies completed to date and  lasting 15 to 20 minutes of a 25 minute visit   I had an extended discussion with the patient reviewing all relevant studies completed to date and  lasting 15 to 20 minutes of a 25 minute visit    I performed detailed device teaching using a teach back method which extended face to face time for this visit (see above)  Each maintenance medication was reviewed in detail including emphasizing most importantly the difference between maintenance and prns and under what circumstances the prns are to be triggered using an action plan format that is not reflected in the computer generated alphabetically organized AVS which I have not found useful in most complex patients, especially with respiratory illnesses  Please see AVS for specific instructions unique to this visit that I personally wrote and verbalized to the the pt in detail and then reviewed with pt  by my nurse highlighting any  changes in therapy recommended at today's visit to their plan of care.       Each maintenance medication was reviewed in detail including most importantly the difference between maintenance and prns and under what circumstances the prns are to be triggered using an action plan format that is not reflected in the computer generated alphabetically organized AVS.     Please see AVS for specific instructions unique to this visit that I personally wrote and verbalized to the the pt in detail and then reviewed  with pt  by my nurse highlighting any  changes in therapy recommended at today's visit to their plan of care.

## 2019-08-13 DIAGNOSIS — J449 Chronic obstructive pulmonary disease, unspecified: Secondary | ICD-10-CM | POA: Diagnosis not present

## 2019-08-13 DIAGNOSIS — B20 Human immunodeficiency virus [HIV] disease: Secondary | ICD-10-CM | POA: Diagnosis not present

## 2019-08-13 DIAGNOSIS — I10 Essential (primary) hypertension: Secondary | ICD-10-CM | POA: Diagnosis not present

## 2019-08-14 DIAGNOSIS — I10 Essential (primary) hypertension: Secondary | ICD-10-CM | POA: Diagnosis not present

## 2019-08-14 DIAGNOSIS — J449 Chronic obstructive pulmonary disease, unspecified: Secondary | ICD-10-CM | POA: Diagnosis not present

## 2019-08-14 DIAGNOSIS — B20 Human immunodeficiency virus [HIV] disease: Secondary | ICD-10-CM | POA: Diagnosis not present

## 2019-08-15 DIAGNOSIS — J449 Chronic obstructive pulmonary disease, unspecified: Secondary | ICD-10-CM | POA: Diagnosis not present

## 2019-08-15 DIAGNOSIS — B20 Human immunodeficiency virus [HIV] disease: Secondary | ICD-10-CM | POA: Diagnosis not present

## 2019-08-15 DIAGNOSIS — I10 Essential (primary) hypertension: Secondary | ICD-10-CM | POA: Diagnosis not present

## 2019-08-16 DIAGNOSIS — I10 Essential (primary) hypertension: Secondary | ICD-10-CM | POA: Diagnosis not present

## 2019-08-16 DIAGNOSIS — B20 Human immunodeficiency virus [HIV] disease: Secondary | ICD-10-CM | POA: Diagnosis not present

## 2019-08-16 DIAGNOSIS — J449 Chronic obstructive pulmonary disease, unspecified: Secondary | ICD-10-CM | POA: Diagnosis not present

## 2019-08-17 DIAGNOSIS — B20 Human immunodeficiency virus [HIV] disease: Secondary | ICD-10-CM | POA: Diagnosis not present

## 2019-08-17 DIAGNOSIS — I10 Essential (primary) hypertension: Secondary | ICD-10-CM | POA: Diagnosis not present

## 2019-08-17 DIAGNOSIS — J449 Chronic obstructive pulmonary disease, unspecified: Secondary | ICD-10-CM | POA: Diagnosis not present

## 2019-08-18 DIAGNOSIS — J449 Chronic obstructive pulmonary disease, unspecified: Secondary | ICD-10-CM | POA: Diagnosis not present

## 2019-08-18 DIAGNOSIS — B20 Human immunodeficiency virus [HIV] disease: Secondary | ICD-10-CM | POA: Diagnosis not present

## 2019-08-18 DIAGNOSIS — I10 Essential (primary) hypertension: Secondary | ICD-10-CM | POA: Diagnosis not present

## 2019-08-19 DIAGNOSIS — I10 Essential (primary) hypertension: Secondary | ICD-10-CM | POA: Diagnosis not present

## 2019-08-19 DIAGNOSIS — B20 Human immunodeficiency virus [HIV] disease: Secondary | ICD-10-CM | POA: Diagnosis not present

## 2019-08-19 DIAGNOSIS — J449 Chronic obstructive pulmonary disease, unspecified: Secondary | ICD-10-CM | POA: Diagnosis not present

## 2019-08-20 DIAGNOSIS — I10 Essential (primary) hypertension: Secondary | ICD-10-CM | POA: Diagnosis not present

## 2019-08-20 DIAGNOSIS — B20 Human immunodeficiency virus [HIV] disease: Secondary | ICD-10-CM | POA: Diagnosis not present

## 2019-08-20 DIAGNOSIS — J449 Chronic obstructive pulmonary disease, unspecified: Secondary | ICD-10-CM | POA: Diagnosis not present

## 2019-08-21 DIAGNOSIS — B20 Human immunodeficiency virus [HIV] disease: Secondary | ICD-10-CM | POA: Diagnosis not present

## 2019-08-21 DIAGNOSIS — I10 Essential (primary) hypertension: Secondary | ICD-10-CM | POA: Diagnosis not present

## 2019-08-21 DIAGNOSIS — J449 Chronic obstructive pulmonary disease, unspecified: Secondary | ICD-10-CM | POA: Diagnosis not present

## 2019-08-21 MED FILL — BIKTARVY 50-200-25 MG TABS: 50-200-25 | 30 days supply | Qty: 30 | Fill #1

## 2019-08-22 DIAGNOSIS — I10 Essential (primary) hypertension: Secondary | ICD-10-CM | POA: Diagnosis not present

## 2019-08-22 DIAGNOSIS — B20 Human immunodeficiency virus [HIV] disease: Secondary | ICD-10-CM | POA: Diagnosis not present

## 2019-08-22 DIAGNOSIS — J449 Chronic obstructive pulmonary disease, unspecified: Secondary | ICD-10-CM | POA: Diagnosis not present

## 2019-08-23 DIAGNOSIS — I10 Essential (primary) hypertension: Secondary | ICD-10-CM | POA: Diagnosis not present

## 2019-08-23 DIAGNOSIS — B20 Human immunodeficiency virus [HIV] disease: Secondary | ICD-10-CM | POA: Diagnosis not present

## 2019-08-23 DIAGNOSIS — J449 Chronic obstructive pulmonary disease, unspecified: Secondary | ICD-10-CM | POA: Diagnosis not present

## 2019-08-24 DIAGNOSIS — B20 Human immunodeficiency virus [HIV] disease: Secondary | ICD-10-CM | POA: Diagnosis not present

## 2019-08-24 DIAGNOSIS — J449 Chronic obstructive pulmonary disease, unspecified: Secondary | ICD-10-CM | POA: Diagnosis not present

## 2019-08-24 DIAGNOSIS — I10 Essential (primary) hypertension: Secondary | ICD-10-CM | POA: Diagnosis not present

## 2019-08-25 DIAGNOSIS — J449 Chronic obstructive pulmonary disease, unspecified: Secondary | ICD-10-CM | POA: Diagnosis not present

## 2019-08-25 DIAGNOSIS — B20 Human immunodeficiency virus [HIV] disease: Secondary | ICD-10-CM | POA: Diagnosis not present

## 2019-08-25 DIAGNOSIS — I10 Essential (primary) hypertension: Secondary | ICD-10-CM | POA: Diagnosis not present

## 2019-08-26 ENCOUNTER — Ambulatory Visit: Payer: Medicaid Other

## 2019-08-26 DIAGNOSIS — I10 Essential (primary) hypertension: Secondary | ICD-10-CM | POA: Diagnosis not present

## 2019-08-26 DIAGNOSIS — J449 Chronic obstructive pulmonary disease, unspecified: Secondary | ICD-10-CM | POA: Diagnosis not present

## 2019-08-26 DIAGNOSIS — B20 Human immunodeficiency virus [HIV] disease: Secondary | ICD-10-CM | POA: Diagnosis not present

## 2019-08-27 ENCOUNTER — Ambulatory Visit (INDEPENDENT_AMBULATORY_CARE_PROVIDER_SITE_OTHER): Payer: Medicaid Other | Admitting: Internal Medicine

## 2019-08-27 ENCOUNTER — Other Ambulatory Visit: Payer: Self-pay | Admitting: Internal Medicine

## 2019-08-27 ENCOUNTER — Other Ambulatory Visit: Payer: Self-pay

## 2019-08-27 VITALS — BP 149/91 | HR 76 | Temp 98.6°F

## 2019-08-27 DIAGNOSIS — M1612 Unilateral primary osteoarthritis, left hip: Secondary | ICD-10-CM

## 2019-08-27 DIAGNOSIS — I10 Essential (primary) hypertension: Secondary | ICD-10-CM | POA: Diagnosis not present

## 2019-08-27 DIAGNOSIS — B20 Human immunodeficiency virus [HIV] disease: Secondary | ICD-10-CM | POA: Diagnosis not present

## 2019-08-27 DIAGNOSIS — Z23 Encounter for immunization: Secondary | ICD-10-CM | POA: Diagnosis not present

## 2019-08-27 DIAGNOSIS — J449 Chronic obstructive pulmonary disease, unspecified: Secondary | ICD-10-CM | POA: Diagnosis not present

## 2019-08-27 MED ORDER — TRAMADOL HCL 50 MG PO TABS: ORAL_TABLET | ORAL | 1 refills | Status: DC
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
## 2019-08-28 DIAGNOSIS — I10 Essential (primary) hypertension: Secondary | ICD-10-CM | POA: Diagnosis not present

## 2019-08-28 DIAGNOSIS — J449 Chronic obstructive pulmonary disease, unspecified: Secondary | ICD-10-CM | POA: Diagnosis not present

## 2019-08-28 DIAGNOSIS — B20 Human immunodeficiency virus [HIV] disease: Secondary | ICD-10-CM | POA: Diagnosis not present

## 2019-08-28 LAB — T-HELPER CELL (CD4) - (RCID CLINIC ONLY)
CD4 % Helper T Cell: 36 % (ref 33–65)
CD4 T Cell Abs: 555 /uL (ref 400–1790)

## 2019-08-29 DIAGNOSIS — I10 Essential (primary) hypertension: Secondary | ICD-10-CM | POA: Diagnosis not present

## 2019-08-29 DIAGNOSIS — B20 Human immunodeficiency virus [HIV] disease: Secondary | ICD-10-CM | POA: Diagnosis not present

## 2019-08-29 DIAGNOSIS — J449 Chronic obstructive pulmonary disease, unspecified: Secondary | ICD-10-CM | POA: Diagnosis not present

## 2019-08-30 DIAGNOSIS — B20 Human immunodeficiency virus [HIV] disease: Secondary | ICD-10-CM | POA: Diagnosis not present

## 2019-08-30 DIAGNOSIS — I10 Essential (primary) hypertension: Secondary | ICD-10-CM | POA: Diagnosis not present

## 2019-08-30 DIAGNOSIS — J449 Chronic obstructive pulmonary disease, unspecified: Secondary | ICD-10-CM | POA: Diagnosis not present

## 2019-08-31 DIAGNOSIS — I10 Essential (primary) hypertension: Secondary | ICD-10-CM | POA: Diagnosis not present

## 2019-08-31 DIAGNOSIS — J449 Chronic obstructive pulmonary disease, unspecified: Secondary | ICD-10-CM | POA: Diagnosis not present

## 2019-08-31 DIAGNOSIS — B20 Human immunodeficiency virus [HIV] disease: Secondary | ICD-10-CM | POA: Diagnosis not present

## 2019-09-01 DIAGNOSIS — B20 Human immunodeficiency virus [HIV] disease: Secondary | ICD-10-CM | POA: Diagnosis not present

## 2019-09-01 DIAGNOSIS — J449 Chronic obstructive pulmonary disease, unspecified: Secondary | ICD-10-CM | POA: Diagnosis not present

## 2019-09-01 DIAGNOSIS — I10 Essential (primary) hypertension: Secondary | ICD-10-CM | POA: Diagnosis not present

## 2019-09-01 MED FILL — FLUTICASONE PROP 50 MCG SPR: 50 | 30 days supply | Qty: 16 | Fill #2

## 2019-09-02 ENCOUNTER — Telehealth: Payer: Self-pay

## 2019-09-02 DIAGNOSIS — B20 Human immunodeficiency virus [HIV] disease: Secondary | ICD-10-CM | POA: Diagnosis not present

## 2019-09-02 DIAGNOSIS — J449 Chronic obstructive pulmonary disease, unspecified: Secondary | ICD-10-CM | POA: Diagnosis not present

## 2019-09-02 DIAGNOSIS — I10 Essential (primary) hypertension: Secondary | ICD-10-CM | POA: Diagnosis not present

## 2019-09-02 LAB — CBC WITH DIFFERENTIAL/PLATELET
Absolute Monocytes: 355 cells/uL (ref 200–950)
Basophils Absolute: 29 cells/uL (ref 0–200)
Basophils Relative: 0.6 %
Eosinophils Absolute: 29 cells/uL (ref 15–500)
Eosinophils Relative: 0.6 %
HCT: 37.9 % (ref 35.0–45.0)
Hemoglobin: 13 g/dL (ref 11.7–15.5)
Lymphs Abs: 1675 cells/uL (ref 850–3900)
MCH: 33 pg (ref 27.0–33.0)
MCHC: 34.3 g/dL (ref 32.0–36.0)
MCV: 96.2 fL (ref 80.0–100.0)
MPV: 10.1 fL (ref 7.5–12.5)
Monocytes Relative: 7.4 %
Neutro Abs: 2712 cells/uL (ref 1500–7800)
Neutrophils Relative %: 56.5 %
Platelets: 280 10*3/uL (ref 140–400)
RBC: 3.94 10*6/uL (ref 3.80–5.10)
RDW: 12.2 % (ref 11.0–15.0)
Total Lymphocyte: 34.9 %
WBC: 4.8 10*3/uL (ref 3.8–10.8)

## 2019-09-02 LAB — COMPLETE METABOLIC PANEL WITH GFR
AG Ratio: 1.7 (calc) (ref 1.0–2.5)
ALT: 8 U/L (ref 6–29)
AST: 19 U/L (ref 10–35)
Albumin: 4.7 g/dL (ref 3.6–5.1)
Alkaline phosphatase (APISO): 56 U/L (ref 37–153)
BUN: 19 mg/dL (ref 7–25)
CO2: 26 mmol/L (ref 20–32)
Calcium: 10.1 mg/dL (ref 8.6–10.4)
Chloride: 102 mmol/L (ref 98–110)
Creat: 1.03 mg/dL (ref 0.50–1.05)
GFR, Est African American: 69 mL/min/{1.73_m2} (ref >=60)
GFR, Est Non African American: 60 mL/min/{1.73_m2} (ref >=60)
Globulin: 2.7 g/dL (calc) (ref 1.9–3.7)
Glucose, Bld: 85 mg/dL (ref 65–99)
Potassium: 4.4 mmol/L (ref 3.5–5.3)
Sodium: 140 mmol/L (ref 135–146)
Total Bilirubin: 0.5 mg/dL (ref 0.2–1.2)
Total Protein: 7.4 g/dL (ref 6.1–8.1)

## 2019-09-02 LAB — HIV-1 RNA QUANT-NO REFLEX-BLD
HIV 1 RNA Quant: 39 copies/mL — ABNORMAL HIGH
HIV-1 RNA Quant, Log: 1.59 Log copies/mL — ABNORMAL HIGH

## 2019-09-02 LAB — LIPID PANEL
Cholesterol: 293 mg/dL — ABNORMAL HIGH (ref <200)
HDL: 105 mg/dL (ref >=50)
LDL Cholesterol (Calc): 172 mg/dL (calc) — ABNORMAL HIGH
Non-HDL Cholesterol (Calc): 188 mg/dL (calc) — ABNORMAL HIGH (ref <130)
Total CHOL/HDL Ratio: 2.8 (calc) (ref <5.0)
Triglycerides: 65 mg/dL (ref <150)

## 2019-09-02 LAB — RPR: RPR Ser Ql: NONREACTIVE

## 2019-09-02 MED ORDER — TRAMADOL HCL 50 MG PO TABS: ORAL_TABLET | ORAL | 1 refills | Status: DC

## 2019-09-02 NOTE — Telephone Encounter (Signed)
Patient called and made aware to try OTC Ibuprofen for ear pain. Patient verbalized understanding.  Janice Williams

## 2019-09-02 NOTE — Telephone Encounter (Signed)
Incoming call received by patient. Patient states she currently is having ear pain to her left ear since early yesterday and has tried OTC pain relieving ear drop with no relief. Patient asking Dr. Baxter Flattery to possibly send in something for ear pain. Patient denies any fevers. Patient would like medication sent to [facility] on [address] to provider to make aware.

## 2019-09-02 NOTE — Telephone Encounter (Signed)
Patient called office today stating her Tramadol was sent into the wrong pharmacy.Patient states she would like prescription sent into [facility] on [address].  Will route message to MD to send Tramadol prescription to [facility]. Canceled prescription at [facility].  [place]

## 2019-09-02 NOTE — Telephone Encounter (Signed)
Patient called and made aware of new Tramadol prescription sent to request [facility]. Janice Williams

## 2019-09-03 DIAGNOSIS — B20 Human immunodeficiency virus [HIV] disease: Secondary | ICD-10-CM | POA: Diagnosis not present

## 2019-09-03 DIAGNOSIS — J449 Chronic obstructive pulmonary disease, unspecified: Secondary | ICD-10-CM | POA: Diagnosis not present

## 2019-09-03 DIAGNOSIS — I10 Essential (primary) hypertension: Secondary | ICD-10-CM | POA: Diagnosis not present

## 2019-09-04 DIAGNOSIS — J449 Chronic obstructive pulmonary disease, unspecified: Secondary | ICD-10-CM | POA: Diagnosis not present

## 2019-09-04 DIAGNOSIS — B20 Human immunodeficiency virus [HIV] disease: Secondary | ICD-10-CM | POA: Diagnosis not present

## 2019-09-04 DIAGNOSIS — I10 Essential (primary) hypertension: Secondary | ICD-10-CM | POA: Diagnosis not present

## 2019-09-05 DIAGNOSIS — B20 Human immunodeficiency virus [HIV] disease: Secondary | ICD-10-CM | POA: Diagnosis not present

## 2019-09-05 DIAGNOSIS — J449 Chronic obstructive pulmonary disease, unspecified: Secondary | ICD-10-CM | POA: Diagnosis not present

## 2019-09-05 DIAGNOSIS — I10 Essential (primary) hypertension: Secondary | ICD-10-CM | POA: Diagnosis not present

## 2019-09-06 DIAGNOSIS — I10 Essential (primary) hypertension: Secondary | ICD-10-CM | POA: Diagnosis not present

## 2019-09-06 DIAGNOSIS — B20 Human immunodeficiency virus [HIV] disease: Secondary | ICD-10-CM | POA: Diagnosis not present

## 2019-09-06 DIAGNOSIS — J449 Chronic obstructive pulmonary disease, unspecified: Secondary | ICD-10-CM | POA: Diagnosis not present

## 2019-09-07 DIAGNOSIS — I10 Essential (primary) hypertension: Secondary | ICD-10-CM | POA: Diagnosis not present

## 2019-09-07 DIAGNOSIS — J449 Chronic obstructive pulmonary disease, unspecified: Secondary | ICD-10-CM | POA: Diagnosis not present

## 2019-09-07 DIAGNOSIS — B20 Human immunodeficiency virus [HIV] disease: Secondary | ICD-10-CM | POA: Diagnosis not present

## 2019-09-08 DIAGNOSIS — J449 Chronic obstructive pulmonary disease, unspecified: Secondary | ICD-10-CM | POA: Diagnosis not present

## 2019-09-08 DIAGNOSIS — I10 Essential (primary) hypertension: Secondary | ICD-10-CM | POA: Diagnosis not present

## 2019-09-08 DIAGNOSIS — B20 Human immunodeficiency virus [HIV] disease: Secondary | ICD-10-CM | POA: Diagnosis not present

## 2019-09-09 DIAGNOSIS — B20 Human immunodeficiency virus [HIV] disease: Secondary | ICD-10-CM | POA: Diagnosis not present

## 2019-09-09 DIAGNOSIS — I10 Essential (primary) hypertension: Secondary | ICD-10-CM | POA: Diagnosis not present

## 2019-09-09 DIAGNOSIS — J449 Chronic obstructive pulmonary disease, unspecified: Secondary | ICD-10-CM | POA: Diagnosis not present

## 2019-09-10 DIAGNOSIS — I10 Essential (primary) hypertension: Secondary | ICD-10-CM | POA: Diagnosis not present

## 2019-09-10 DIAGNOSIS — J449 Chronic obstructive pulmonary disease, unspecified: Secondary | ICD-10-CM | POA: Diagnosis not present

## 2019-09-10 DIAGNOSIS — B20 Human immunodeficiency virus [HIV] disease: Secondary | ICD-10-CM | POA: Diagnosis not present

## 2019-09-11 DIAGNOSIS — B20 Human immunodeficiency virus [HIV] disease: Secondary | ICD-10-CM | POA: Diagnosis not present

## 2019-09-11 DIAGNOSIS — I10 Essential (primary) hypertension: Secondary | ICD-10-CM | POA: Diagnosis not present

## 2019-09-11 DIAGNOSIS — J449 Chronic obstructive pulmonary disease, unspecified: Secondary | ICD-10-CM | POA: Diagnosis not present

## 2019-09-12 DIAGNOSIS — I10 Essential (primary) hypertension: Secondary | ICD-10-CM | POA: Diagnosis not present

## 2019-09-12 DIAGNOSIS — J449 Chronic obstructive pulmonary disease, unspecified: Secondary | ICD-10-CM | POA: Diagnosis not present

## 2019-09-12 DIAGNOSIS — B20 Human immunodeficiency virus [HIV] disease: Secondary | ICD-10-CM | POA: Diagnosis not present

## 2019-09-13 DIAGNOSIS — B20 Human immunodeficiency virus [HIV] disease: Secondary | ICD-10-CM | POA: Diagnosis not present

## 2019-09-13 DIAGNOSIS — I10 Essential (primary) hypertension: Secondary | ICD-10-CM | POA: Diagnosis not present

## 2019-09-13 DIAGNOSIS — J449 Chronic obstructive pulmonary disease, unspecified: Secondary | ICD-10-CM | POA: Diagnosis not present

## 2019-09-14 DIAGNOSIS — B20 Human immunodeficiency virus [HIV] disease: Secondary | ICD-10-CM | POA: Diagnosis not present

## 2019-09-14 DIAGNOSIS — J449 Chronic obstructive pulmonary disease, unspecified: Secondary | ICD-10-CM | POA: Diagnosis not present

## 2019-09-14 DIAGNOSIS — I10 Essential (primary) hypertension: Secondary | ICD-10-CM | POA: Diagnosis not present

## 2019-09-15 DIAGNOSIS — I10 Essential (primary) hypertension: Secondary | ICD-10-CM | POA: Diagnosis not present

## 2019-09-15 DIAGNOSIS — J449 Chronic obstructive pulmonary disease, unspecified: Secondary | ICD-10-CM | POA: Diagnosis not present

## 2019-09-15 DIAGNOSIS — B20 Human immunodeficiency virus [HIV] disease: Secondary | ICD-10-CM | POA: Diagnosis not present

## 2019-09-16 DIAGNOSIS — B20 Human immunodeficiency virus [HIV] disease: Secondary | ICD-10-CM | POA: Diagnosis not present

## 2019-09-16 DIAGNOSIS — I10 Essential (primary) hypertension: Secondary | ICD-10-CM | POA: Diagnosis not present

## 2019-09-16 DIAGNOSIS — J449 Chronic obstructive pulmonary disease, unspecified: Secondary | ICD-10-CM | POA: Diagnosis not present

## 2019-09-17 ENCOUNTER — Other Ambulatory Visit: Payer: Self-pay | Admitting: Internal Medicine

## 2019-09-17 DIAGNOSIS — E785 Hyperlipidemia, unspecified: Secondary | ICD-10-CM

## 2019-09-17 DIAGNOSIS — B20 Human immunodeficiency virus [HIV] disease: Secondary | ICD-10-CM | POA: Diagnosis not present

## 2019-09-17 DIAGNOSIS — J449 Chronic obstructive pulmonary disease, unspecified: Secondary | ICD-10-CM | POA: Diagnosis not present

## 2019-09-17 DIAGNOSIS — I10 Essential (primary) hypertension: Secondary | ICD-10-CM | POA: Diagnosis not present

## 2019-09-17 DIAGNOSIS — K2100 Gastro-esophageal reflux disease with esophagitis, without bleeding: Secondary | ICD-10-CM

## 2019-09-17 MED FILL — BIKTARVY 50-200-25 MG TABS: 50-200-25 | 30 days supply | Qty: 30 | Fill #2

## 2019-09-18 DIAGNOSIS — J449 Chronic obstructive pulmonary disease, unspecified: Secondary | ICD-10-CM | POA: Diagnosis not present

## 2019-09-18 DIAGNOSIS — B20 Human immunodeficiency virus [HIV] disease: Secondary | ICD-10-CM | POA: Diagnosis not present

## 2019-09-18 DIAGNOSIS — I10 Essential (primary) hypertension: Secondary | ICD-10-CM | POA: Diagnosis not present

## 2019-09-19 DIAGNOSIS — B20 Human immunodeficiency virus [HIV] disease: Secondary | ICD-10-CM | POA: Diagnosis not present

## 2019-09-19 DIAGNOSIS — I10 Essential (primary) hypertension: Secondary | ICD-10-CM | POA: Diagnosis not present

## 2019-09-19 DIAGNOSIS — J449 Chronic obstructive pulmonary disease, unspecified: Secondary | ICD-10-CM | POA: Diagnosis not present

## 2019-09-20 DIAGNOSIS — J449 Chronic obstructive pulmonary disease, unspecified: Secondary | ICD-10-CM | POA: Diagnosis not present

## 2019-09-20 DIAGNOSIS — I10 Essential (primary) hypertension: Secondary | ICD-10-CM | POA: Diagnosis not present

## 2019-09-20 DIAGNOSIS — B20 Human immunodeficiency virus [HIV] disease: Secondary | ICD-10-CM | POA: Diagnosis not present

## 2019-09-21 DIAGNOSIS — B20 Human immunodeficiency virus [HIV] disease: Secondary | ICD-10-CM | POA: Diagnosis not present

## 2019-09-21 DIAGNOSIS — I10 Essential (primary) hypertension: Secondary | ICD-10-CM | POA: Diagnosis not present

## 2019-09-21 DIAGNOSIS — J449 Chronic obstructive pulmonary disease, unspecified: Secondary | ICD-10-CM | POA: Diagnosis not present

## 2019-09-22 DIAGNOSIS — I10 Essential (primary) hypertension: Secondary | ICD-10-CM | POA: Diagnosis not present

## 2019-09-22 DIAGNOSIS — B20 Human immunodeficiency virus [HIV] disease: Secondary | ICD-10-CM | POA: Diagnosis not present

## 2019-09-22 DIAGNOSIS — J449 Chronic obstructive pulmonary disease, unspecified: Secondary | ICD-10-CM | POA: Diagnosis not present

## 2019-09-23 DIAGNOSIS — I10 Essential (primary) hypertension: Secondary | ICD-10-CM | POA: Diagnosis not present

## 2019-09-23 DIAGNOSIS — J449 Chronic obstructive pulmonary disease, unspecified: Secondary | ICD-10-CM | POA: Diagnosis not present

## 2019-09-23 DIAGNOSIS — B20 Human immunodeficiency virus [HIV] disease: Secondary | ICD-10-CM | POA: Diagnosis not present

## 2019-09-24 DIAGNOSIS — B20 Human immunodeficiency virus [HIV] disease: Secondary | ICD-10-CM | POA: Diagnosis not present

## 2019-09-24 DIAGNOSIS — J449 Chronic obstructive pulmonary disease, unspecified: Secondary | ICD-10-CM | POA: Diagnosis not present

## 2019-09-24 DIAGNOSIS — I10 Essential (primary) hypertension: Secondary | ICD-10-CM | POA: Diagnosis not present

## 2019-09-25 DIAGNOSIS — B20 Human immunodeficiency virus [HIV] disease: Secondary | ICD-10-CM | POA: Diagnosis not present

## 2019-09-25 DIAGNOSIS — J449 Chronic obstructive pulmonary disease, unspecified: Secondary | ICD-10-CM | POA: Diagnosis not present

## 2019-09-25 DIAGNOSIS — I10 Essential (primary) hypertension: Secondary | ICD-10-CM | POA: Diagnosis not present

## 2019-09-26 DIAGNOSIS — I10 Essential (primary) hypertension: Secondary | ICD-10-CM | POA: Diagnosis not present

## 2019-09-26 DIAGNOSIS — J449 Chronic obstructive pulmonary disease, unspecified: Secondary | ICD-10-CM | POA: Diagnosis not present

## 2019-09-26 DIAGNOSIS — B20 Human immunodeficiency virus [HIV] disease: Secondary | ICD-10-CM | POA: Diagnosis not present

## 2019-09-27 DIAGNOSIS — B20 Human immunodeficiency virus [HIV] disease: Secondary | ICD-10-CM | POA: Diagnosis not present

## 2019-09-27 DIAGNOSIS — I10 Essential (primary) hypertension: Secondary | ICD-10-CM | POA: Diagnosis not present

## 2019-09-27 DIAGNOSIS — J449 Chronic obstructive pulmonary disease, unspecified: Secondary | ICD-10-CM | POA: Diagnosis not present

## 2019-09-28 DIAGNOSIS — B20 Human immunodeficiency virus [HIV] disease: Secondary | ICD-10-CM | POA: Diagnosis not present

## 2019-09-28 DIAGNOSIS — J449 Chronic obstructive pulmonary disease, unspecified: Secondary | ICD-10-CM | POA: Diagnosis not present

## 2019-09-28 DIAGNOSIS — I10 Essential (primary) hypertension: Secondary | ICD-10-CM | POA: Diagnosis not present

## 2019-09-29 DIAGNOSIS — B20 Human immunodeficiency virus [HIV] disease: Secondary | ICD-10-CM | POA: Diagnosis not present

## 2019-09-29 DIAGNOSIS — J449 Chronic obstructive pulmonary disease, unspecified: Secondary | ICD-10-CM | POA: Diagnosis not present

## 2019-09-29 DIAGNOSIS — I10 Essential (primary) hypertension: Secondary | ICD-10-CM | POA: Diagnosis not present

## 2019-09-30 DIAGNOSIS — J449 Chronic obstructive pulmonary disease, unspecified: Secondary | ICD-10-CM | POA: Diagnosis not present

## 2019-09-30 DIAGNOSIS — B20 Human immunodeficiency virus [HIV] disease: Secondary | ICD-10-CM | POA: Diagnosis not present

## 2019-09-30 DIAGNOSIS — I10 Essential (primary) hypertension: Secondary | ICD-10-CM | POA: Diagnosis not present

## 2019-10-01 DIAGNOSIS — I10 Essential (primary) hypertension: Secondary | ICD-10-CM | POA: Diagnosis not present

## 2019-10-01 DIAGNOSIS — J449 Chronic obstructive pulmonary disease, unspecified: Secondary | ICD-10-CM | POA: Diagnosis not present

## 2019-10-01 DIAGNOSIS — B20 Human immunodeficiency virus [HIV] disease: Secondary | ICD-10-CM | POA: Diagnosis not present

## 2019-10-02 DIAGNOSIS — B20 Human immunodeficiency virus [HIV] disease: Secondary | ICD-10-CM | POA: Diagnosis not present

## 2019-10-02 DIAGNOSIS — I10 Essential (primary) hypertension: Secondary | ICD-10-CM | POA: Diagnosis not present

## 2019-10-02 DIAGNOSIS — J449 Chronic obstructive pulmonary disease, unspecified: Secondary | ICD-10-CM | POA: Diagnosis not present

## 2019-10-03 DIAGNOSIS — B20 Human immunodeficiency virus [HIV] disease: Secondary | ICD-10-CM | POA: Diagnosis not present

## 2019-10-03 DIAGNOSIS — I10 Essential (primary) hypertension: Secondary | ICD-10-CM | POA: Diagnosis not present

## 2019-10-03 DIAGNOSIS — J449 Chronic obstructive pulmonary disease, unspecified: Secondary | ICD-10-CM | POA: Diagnosis not present

## 2019-10-04 DIAGNOSIS — J449 Chronic obstructive pulmonary disease, unspecified: Secondary | ICD-10-CM | POA: Diagnosis not present

## 2019-10-04 DIAGNOSIS — I10 Essential (primary) hypertension: Secondary | ICD-10-CM | POA: Diagnosis not present

## 2019-10-04 DIAGNOSIS — B20 Human immunodeficiency virus [HIV] disease: Secondary | ICD-10-CM | POA: Diagnosis not present

## 2019-10-05 DIAGNOSIS — J449 Chronic obstructive pulmonary disease, unspecified: Secondary | ICD-10-CM | POA: Diagnosis not present

## 2019-10-05 DIAGNOSIS — I10 Essential (primary) hypertension: Secondary | ICD-10-CM | POA: Diagnosis not present

## 2019-10-05 DIAGNOSIS — B20 Human immunodeficiency virus [HIV] disease: Secondary | ICD-10-CM | POA: Diagnosis not present

## 2019-10-06 ENCOUNTER — Other Ambulatory Visit: Payer: Self-pay

## 2019-10-06 ENCOUNTER — Ambulatory Visit
Admission: RE | Admit: 2019-10-06 | Discharge: 2019-10-06 | Disposition: A | Discharge status: Home / Self Care | Payer: Medicaid Other | Source: Ambulatory Visit | Attending: Internal Medicine | Admitting: Internal Medicine

## 2019-10-06 DIAGNOSIS — I10 Essential (primary) hypertension: Secondary | ICD-10-CM | POA: Diagnosis not present

## 2019-10-06 DIAGNOSIS — B20 Human immunodeficiency virus [HIV] disease: Secondary | ICD-10-CM | POA: Diagnosis not present

## 2019-10-06 DIAGNOSIS — J449 Chronic obstructive pulmonary disease, unspecified: Secondary | ICD-10-CM | POA: Diagnosis not present

## 2019-10-06 DIAGNOSIS — Z1231 Encounter for screening mammogram for malignant neoplasm of breast: Secondary | ICD-10-CM

## 2019-10-06 IMAGING — MG DIGITAL SCREENING BILAT W/ TOMO W/ CAD
8 of 17 series · 9 of 40 positions shown · non-contrast
Comparison: Previous exam(s).

CLINICAL DATA: Screening.

EXAM:
DIGITAL SCREENING BILATERAL MAMMOGRAM WITH TOMO AND CAD

[R MLO synth-2D (1 of 2)]
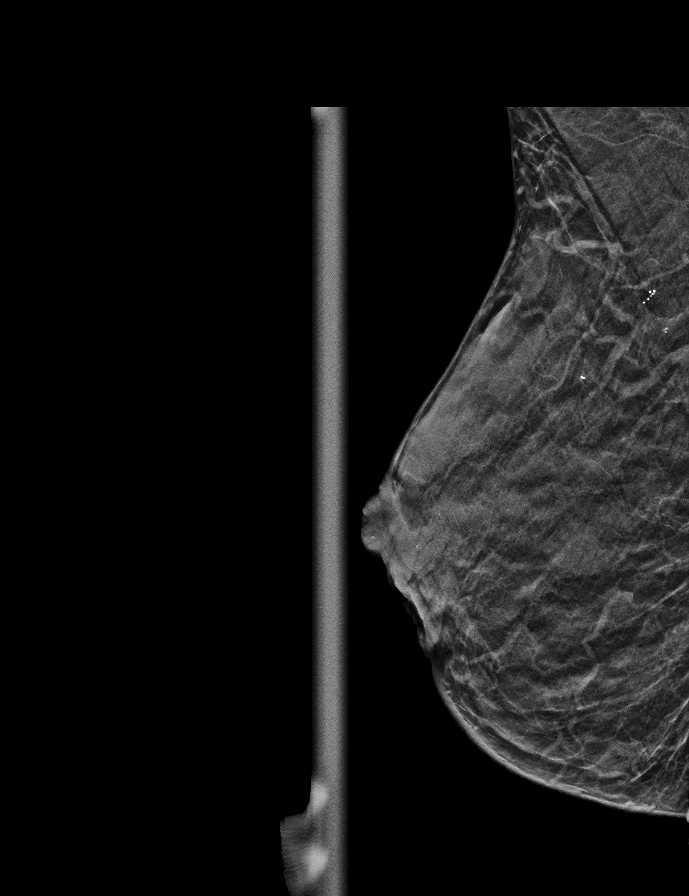

[R MLO synth-2D (2 of 2)]
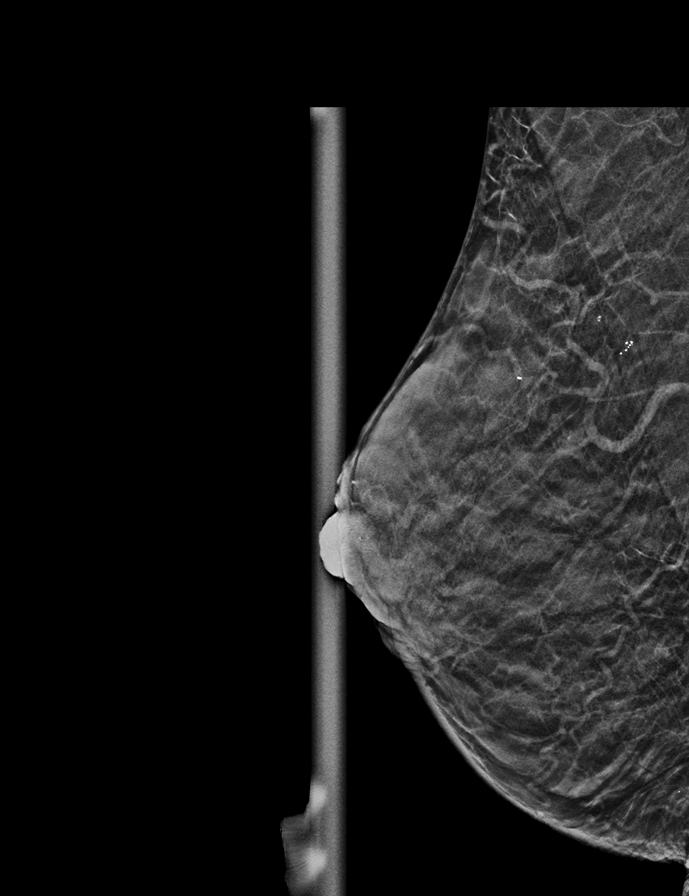

[L CC synth-2D (1 of 2)]
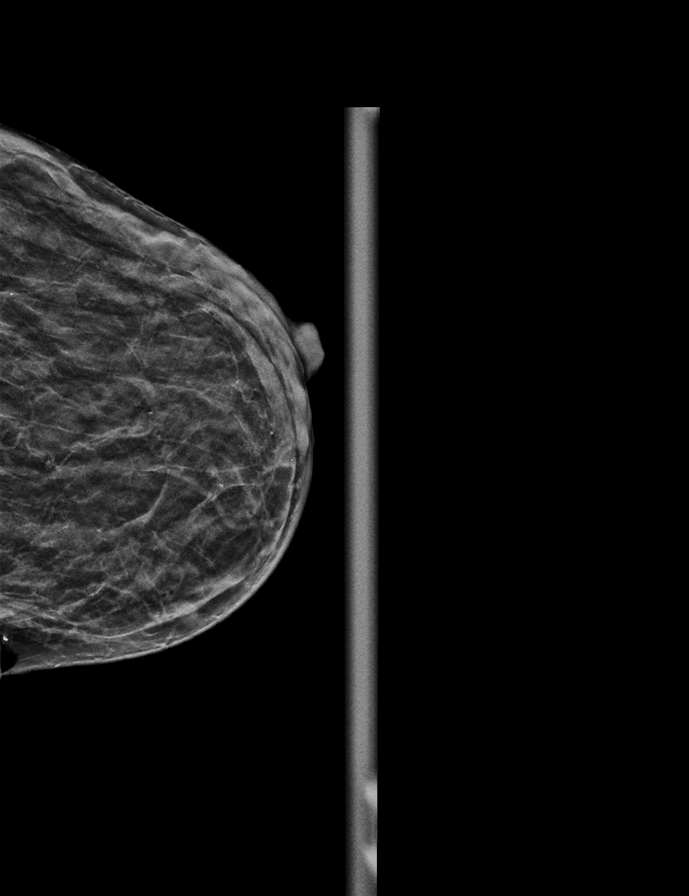

[R CC synth-2D (1 of 2)]
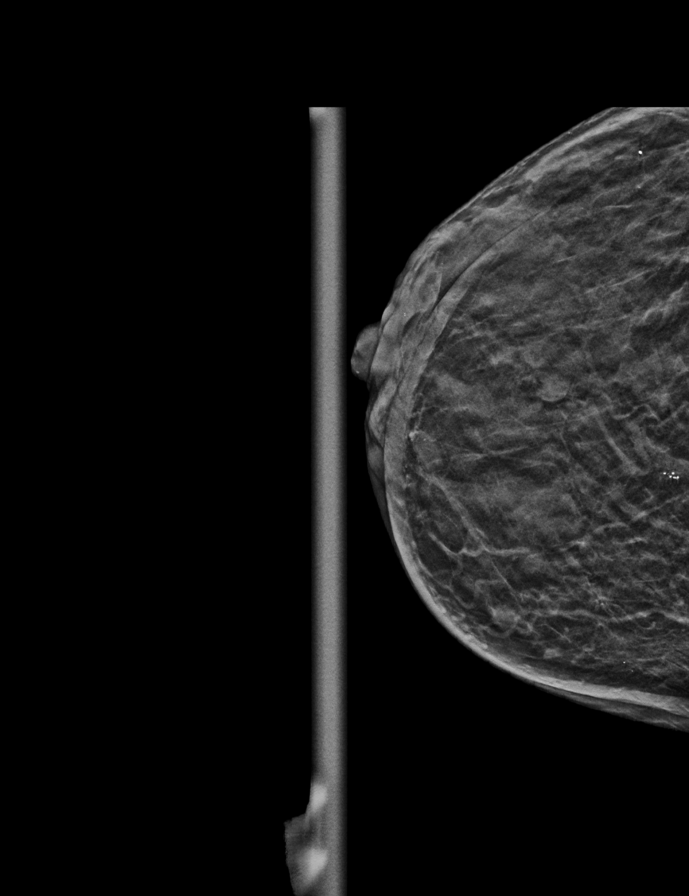

[L CC synth-2D (2 of 2)]
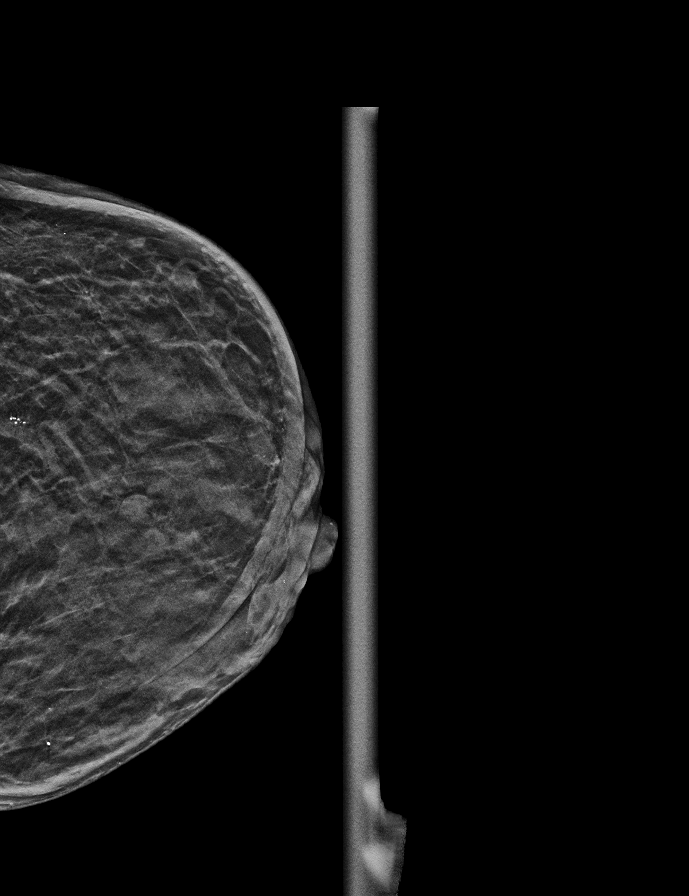

[L MLO synth-2D]
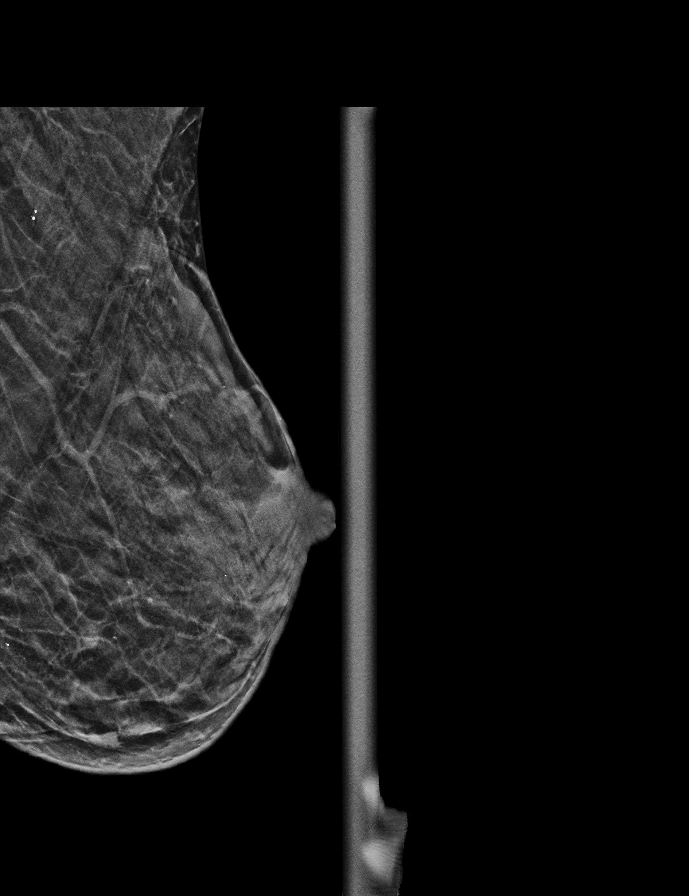

[R CC synth-2D (2 of 2)]
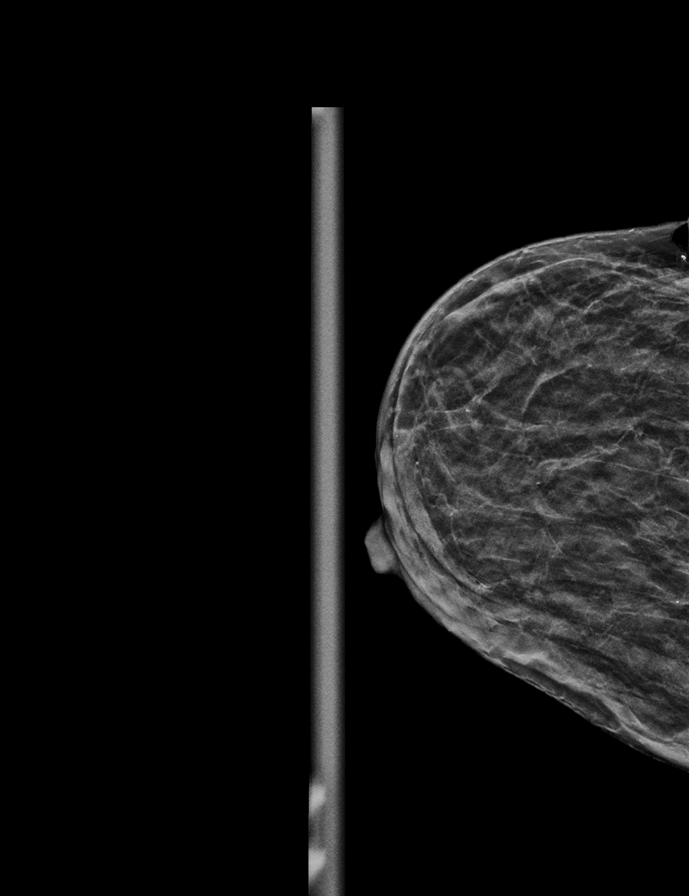

[L CC tomo · 2 of 22 frames shown]
[frame 8/22]
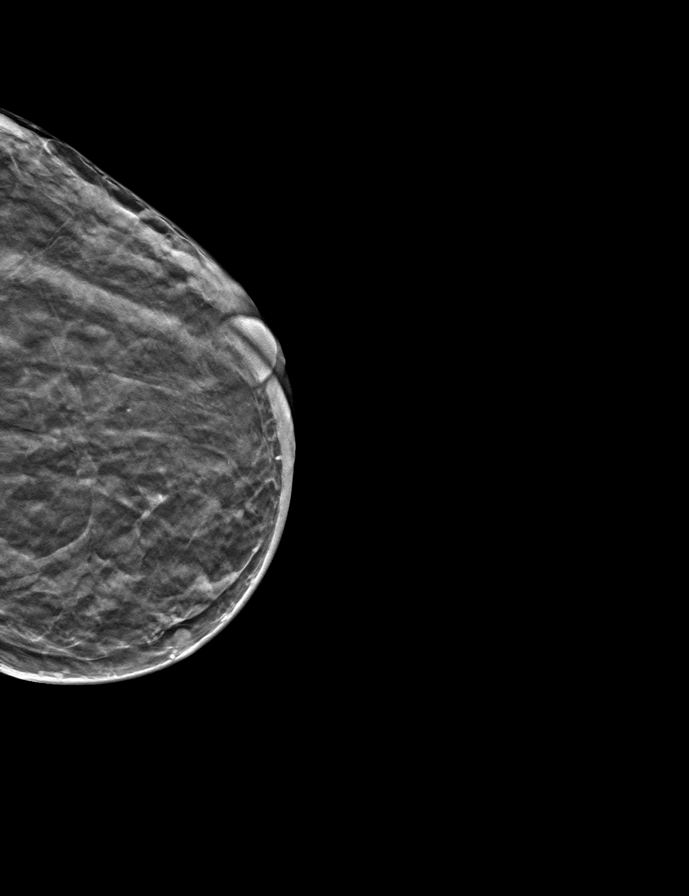
[frame 11/22]
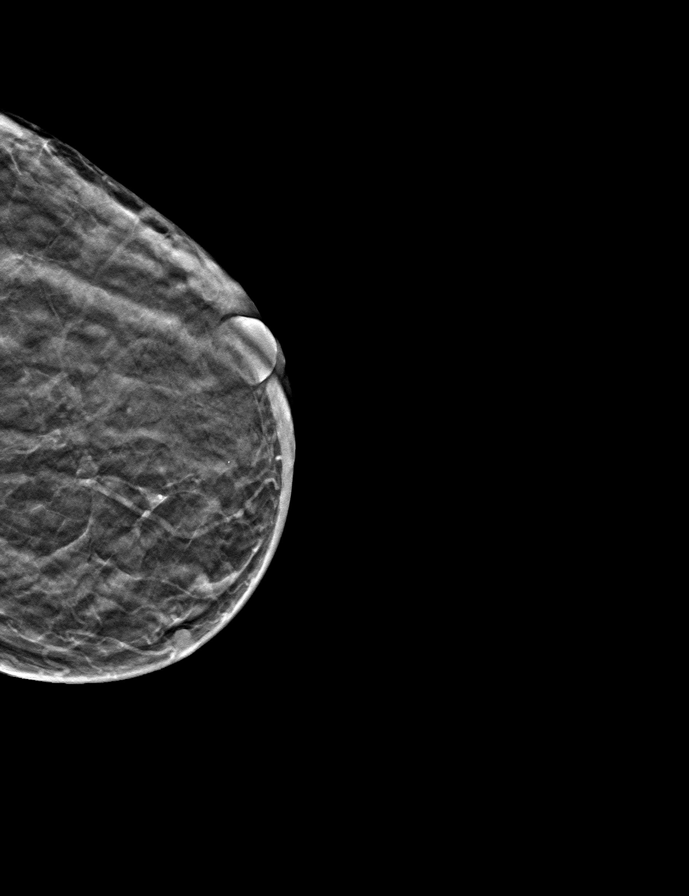

[9 of 40 positions shown; findings below may reference images not displayed]

ACR Breast Density Category d: The breast tissue is extremely dense,
which lowers the sensitivity of mammography
FINDINGS: There are no findings suspicious for malignancy. Images were
processed with CAD.
IMPRESSION: No mammographic evidence of malignancy. A result letter of this
screening mammogram will be mailed directly to the patient.

RECOMMENDATION:
Screening mammogram in one year. (Code:[5I])

BI-RADS CATEGORY  1: Negative.

## 2019-10-07 DIAGNOSIS — J449 Chronic obstructive pulmonary disease, unspecified: Secondary | ICD-10-CM | POA: Diagnosis not present

## 2019-10-07 DIAGNOSIS — I10 Essential (primary) hypertension: Secondary | ICD-10-CM | POA: Diagnosis not present

## 2019-10-07 DIAGNOSIS — B20 Human immunodeficiency virus [HIV] disease: Secondary | ICD-10-CM | POA: Diagnosis not present

## 2019-10-08 DIAGNOSIS — B20 Human immunodeficiency virus [HIV] disease: Secondary | ICD-10-CM | POA: Diagnosis not present

## 2019-10-08 DIAGNOSIS — I10 Essential (primary) hypertension: Secondary | ICD-10-CM | POA: Diagnosis not present

## 2019-10-08 DIAGNOSIS — J449 Chronic obstructive pulmonary disease, unspecified: Secondary | ICD-10-CM | POA: Diagnosis not present

## 2019-10-09 DIAGNOSIS — J449 Chronic obstructive pulmonary disease, unspecified: Secondary | ICD-10-CM | POA: Diagnosis not present

## 2019-10-09 DIAGNOSIS — B20 Human immunodeficiency virus [HIV] disease: Secondary | ICD-10-CM | POA: Diagnosis not present

## 2019-10-09 DIAGNOSIS — I10 Essential (primary) hypertension: Secondary | ICD-10-CM | POA: Diagnosis not present

## 2019-10-10 ENCOUNTER — Other Ambulatory Visit: Payer: Self-pay

## 2019-10-10 ENCOUNTER — Ambulatory Visit (HOSPITAL_COMMUNITY)
Admission: RE | Admit: 2019-10-10 | Discharge: 2019-10-10 | Disposition: A | Discharge status: Home / Self Care | Payer: Medicaid Other | Source: Ambulatory Visit | Attending: Internal Medicine | Admitting: Internal Medicine

## 2019-10-10 ENCOUNTER — Ambulatory Visit: Payer: Medicaid Other | Admitting: Internal Medicine

## 2019-10-10 ENCOUNTER — Encounter: Payer: Self-pay | Admitting: Internal Medicine

## 2019-10-10 VITALS — BP 119/87 | HR 72 | Temp 98.2°F | Ht 63.0 in | Wt 101.3 lb

## 2019-10-10 DIAGNOSIS — Z87891 Personal history of nicotine dependence: Secondary | ICD-10-CM | POA: Diagnosis not present

## 2019-10-10 DIAGNOSIS — Z79899 Other long term (current) drug therapy: Secondary | ICD-10-CM | POA: Diagnosis not present

## 2019-10-10 DIAGNOSIS — M25562 Pain in left knee: Secondary | ICD-10-CM

## 2019-10-10 DIAGNOSIS — Z7983 Long term (current) use of bisphosphonates: Secondary | ICD-10-CM | POA: Diagnosis not present

## 2019-10-10 DIAGNOSIS — M810 Age-related osteoporosis without current pathological fracture: Secondary | ICD-10-CM

## 2019-10-10 DIAGNOSIS — G8929 Other chronic pain: Secondary | ICD-10-CM | POA: Diagnosis not present

## 2019-10-10 DIAGNOSIS — I10 Essential (primary) hypertension: Secondary | ICD-10-CM | POA: Diagnosis not present

## 2019-10-10 DIAGNOSIS — J449 Chronic obstructive pulmonary disease, unspecified: Secondary | ICD-10-CM | POA: Diagnosis not present

## 2019-10-10 DIAGNOSIS — B20 Human immunodeficiency virus [HIV] disease: Secondary | ICD-10-CM | POA: Diagnosis not present

## 2019-10-10 IMAGING — DX DG KNEE COMPLETE 4+V*L*
4 series · 4 of 4 positions shown · non-contrast
Comparison: [DATE]

CLINICAL DATA: Left knee pain for 1 month, no known injury, initial
encounter

EXAM:
LEFT KNEE - COMPLETE 4+ VIEW

[knee ap]
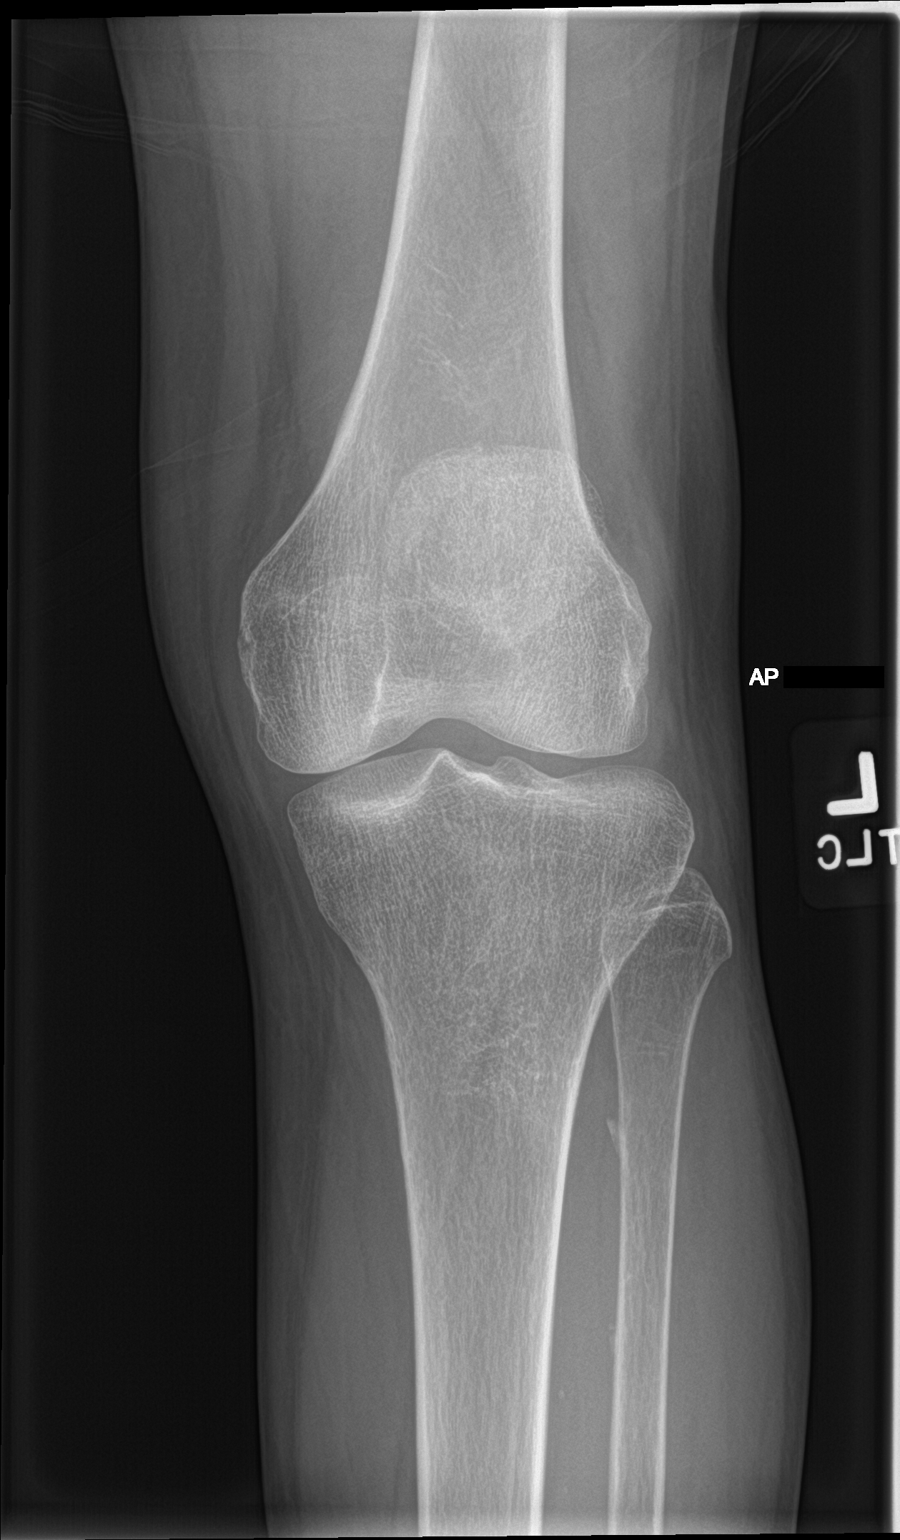

[tunnel]
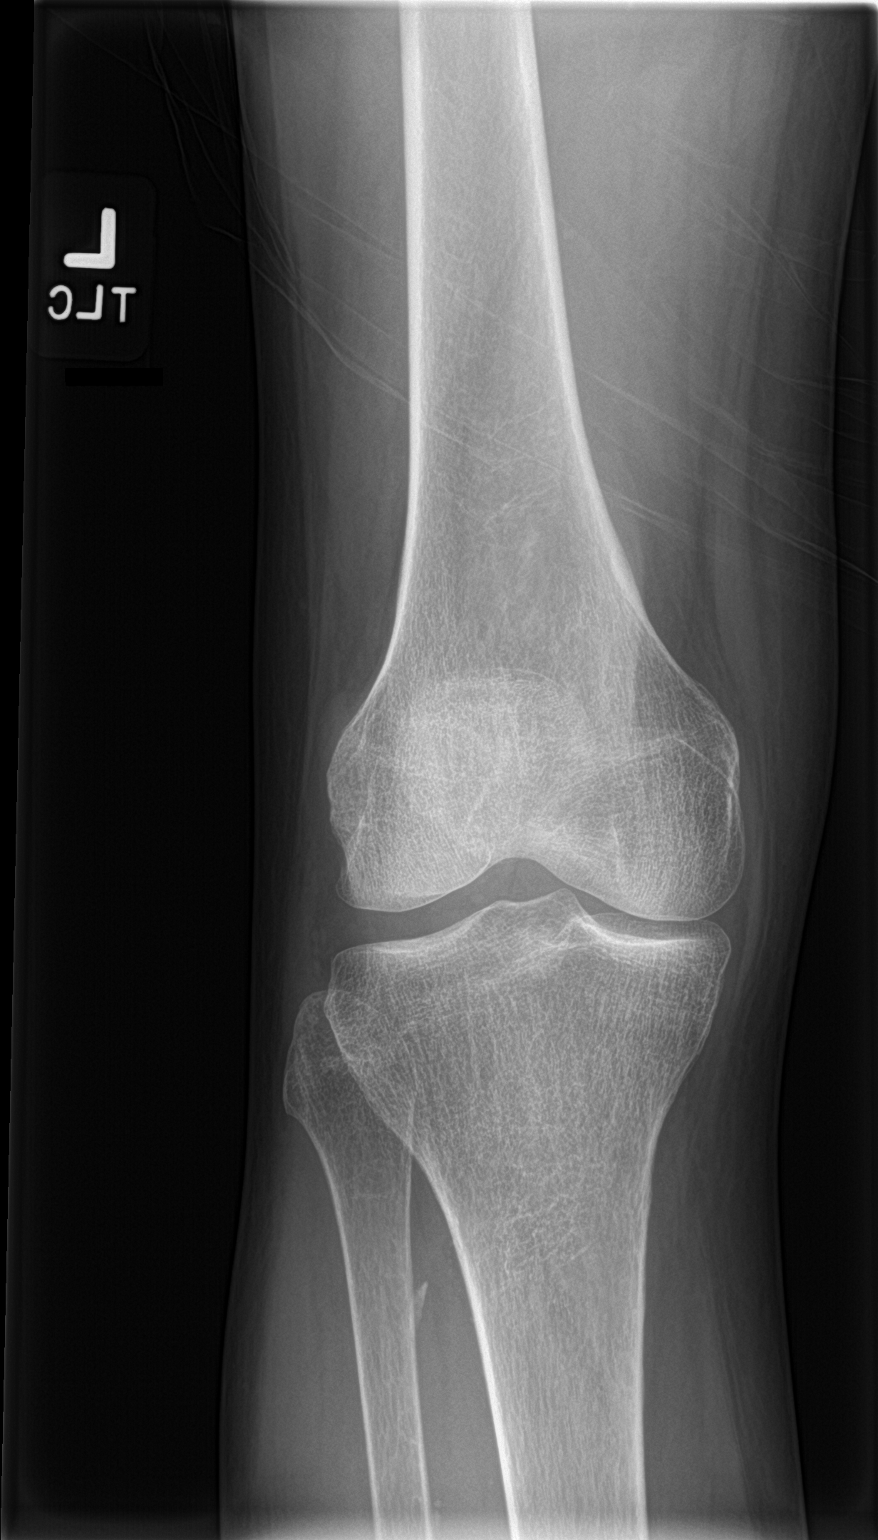

[knee lat]
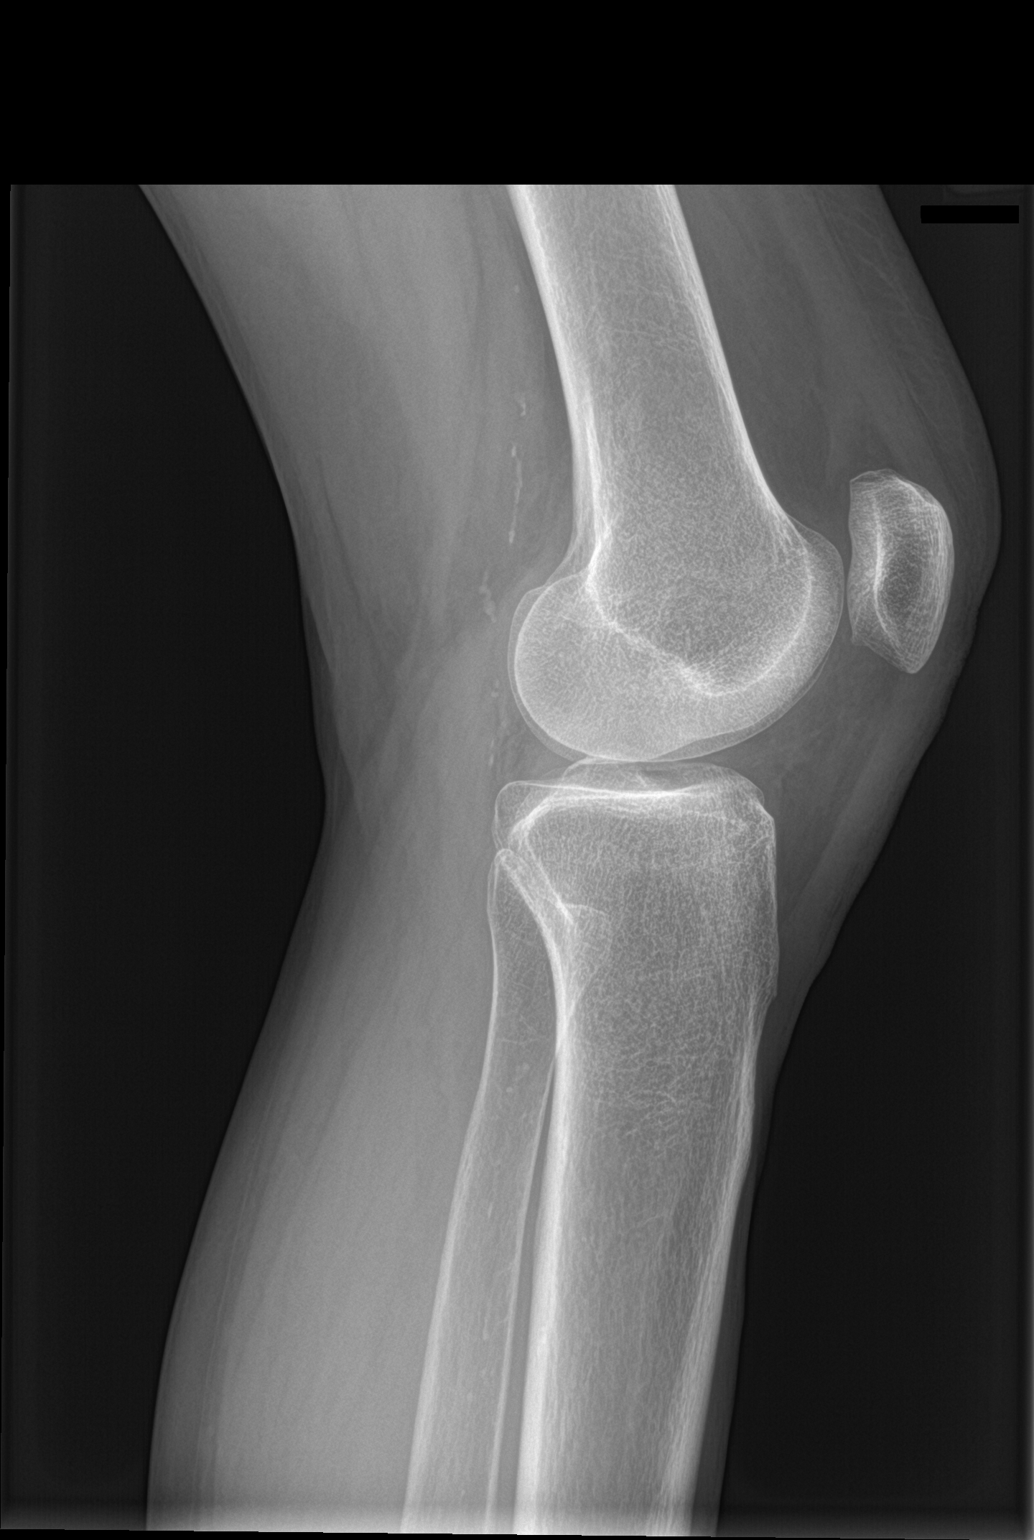

[knee sunrise]
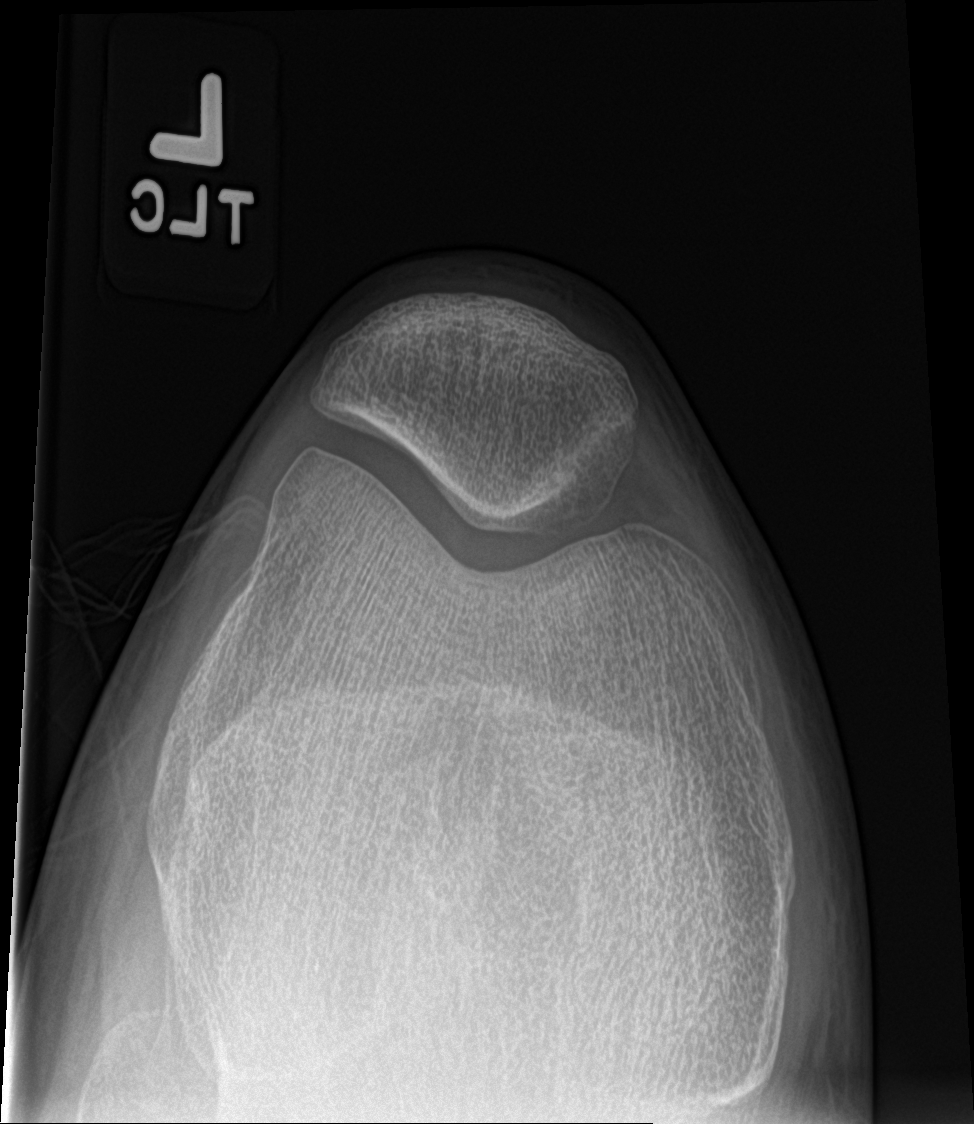

[4 of 4 positions shown; findings below may reference images not displayed]

FINDINGS: No evidence of fracture, dislocation, or joint effusion. No evidence
of arthropathy or other focal bone abnormality. Soft tissues are
unremarkable.
IMPRESSION: No acute abnormality noted.

## 2019-10-10 MED ORDER — ALENDRONATE SODIUM 70 MG PO TABS: 70.0000 mg | ORAL_TABLET | ORAL | 3 refills | Status: AC

## 2019-10-10 MED ORDER — DICLOFENAC SODIUM 1 % TD GEL: 2.0000 g | Freq: Four times a day (QID) | TRANSDERMAL | 2 refills | Status: DC | PRN

## 2019-10-10 MED ORDER — CALCIUM CITRATE-VITAMIN D 315-200 MG-UNIT PO TABS: 2.0000 | ORAL_TABLET | Freq: Two times a day (BID) | ORAL | 11 refills | Status: DC

## 2019-10-10 NOTE — Patient Instructions (Addendum)
Thank you for allowing [place] to care for you and welcome to our clinic  For your chronic knee pain - We will get imaging to further evaluate for arthritis - Continue with current pain medication for now - Gel prescribed to apply up to 4 times per day as need for your pain  For your osteoporosis - We will restart Alendronate today  Alendronate tablets What is this medicine? ALENDRONATE (a LEN droe nate) slows calcium loss from bones. It helps to make normal healthy bone and to slow bone loss in people with Paget's disease and osteoporosis. It may be used in others at risk for bone loss. This medicine may be used for other purposes; ask your health care provider or pharmacist if you have questions. COMMON BRAND NAME(S): Fosamax What should I tell my health care provider before I take this medicine? They need to know if you have any of these conditions:  dental disease  esophagus, stomach, or intestine problems, like acid reflux or GERD  kidney disease  low blood calcium  low vitamin D  problems sitting or standing 30 minutes  trouble swallowing  an unusual or allergic reaction to alendronate, other medicines, foods, dyes, or preservatives  pregnant or trying to get pregnant  breast-feeding How should I use this medicine? You must take this medicine exactly as directed or you will lower the amount of the medicine you absorb into your body or you may cause yourself harm. Take this medicine by mouth first thing in the morning, after you are up for the day. Do not eat or drink anything before you take your medicine. Swallow the tablet with a full glass (6 to 8 fluid ounces) of plain water. Do not take this medicine with any other drink. Do not chew or crush the tablet. After taking this medicine, do not eat breakfast, drink, or take any medicines or vitamins for at least 30 minutes. Sit or stand up for at least 30 minutes after you take this medicine; do not lie down. Do not take your  medicine more often than directed. Talk to your pediatrician regarding the use of this medicine in children. Special care may be needed. Overdosage: If you think you have taken too much of this medicine contact a poison control center or emergency room at once. NOTE: This medicine is only for you. Do not share this medicine with others. What if I miss a dose? If you miss a dose, do not take it later in the day. Continue your normal schedule starting the next morning. Do not take double or extra doses. What may interact with this medicine?  aluminum hydroxide  antacids  aspirin  calcium supplements  drugs for inflammation like ibuprofen, naproxen, and others  iron supplements  magnesium supplements  vitamins with minerals This list may not describe all possible interactions. Give your health care provider a list of all the medicines, herbs, non-prescription drugs, or dietary supplements you use. Also tell them if you smoke, drink alcohol, or use illegal drugs. Some items may interact with your medicine. What should I watch for while using this medicine? Visit your doctor or health care professional for regular checks ups. It may be some time before you see benefit from this medicine. Do not stop taking your medicine except on your doctor's advice. Your doctor or health care professional may order blood tests and other tests to see how you are doing. You should make sure you get enough calcium and vitamin D while you  are taking this medicine, unless your doctor tells you not to. Discuss the foods you eat and the vitamins you take with your health care professional. Some people who take this medicine have severe bone, joint, and/or muscle pain. This medicine may also increase your risk for a broken thigh bone. Tell your doctor right away if you have pain in your upper leg or groin. Tell your doctor if you have any pain that does not go away or that gets worse. This medicine can make you more  sensitive to the sun. If you get a rash while taking this medicine, sunlight may cause the rash to get worse. Keep out of the sun. If you cannot avoid being in the sun, wear protective clothing and use sunscreen. Do not use sun lamps or tanning beds/booths. What side effects may I notice from receiving this medicine? Side effects that you should report to your doctor or health care professional as soon as possible:  allergic reactions like skin rash, itching or hives, swelling of the face, lips, or tongue  black or tarry stools  bone, muscle or joint pain  changes in vision  chest pain  heartburn or stomach pain  jaw pain, especially after dental work  pain or trouble when swallowing  redness, blistering, peeling or loosening of the skin, including inside the mouth Side effects that usually do not require medical attention (report to your doctor or health care professional if they continue or are bothersome):  changes in taste  diarrhea or constipation  eye pain or itching  headache  nausea or vomiting  stomach gas or fullness This list may not describe all possible side effects. Call your doctor for medical advice about side effects. You may report side effects to FDA at 1-800-FDA-1088. Where should I keep my medicine? Keep out of the reach of children. Store at room temperature of 15 and 30 degrees C (59 and 86 degrees F). Throw away any unused medicine after the expiration date. NOTE: This sheet is a summary. It may not cover all possible information. If you have questions about this medicine, talk to your doctor, pharmacist, or health care provider.  2020 Elsevier/Gold Standard (2011-06-02 08:56:09)

## 2019-10-10 NOTE — Progress Notes (Signed)
Internal Medicine Clinic Attending  Case discussed with Dr. Trilby Drummer at the time of the visit.  We reviewed the resident's history and exam and pertinent patient test results.  I agree with the assessment, diagnosis, and plan of care documented in the resident's note.  It is not clear why this 58 year old has osteoporosis.  She has no obvious risk factors and it would be prudent to investigate further at a future appointment.

## 2019-10-10 NOTE — Assessment & Plan Note (Signed)
Patient has a history of osteoporosis with previous T score of -2.9. She was briefly on Alendronate in the past, but this was stopped for unclear reasons. We will restart today, instruction on how to take the medication were reviewed and printed on AVS. - Alendronate 70mg  qWeek - We will also resume calcium and vitamin D supplementation

## 2019-10-10 NOTE — Assessment & Plan Note (Addendum)
Patient reports 1 year of left knee pain. The pain has gradually worsened and worsened more rapidly in the past few months. The pain is worse with activity throughout the day. She state she does have some pain in the night as well. She denies any inciting event, swelling, no fevers. She has been taking tramadol, which provides relief. She tried NSAIDs previously without relief and these also irritated her stomach. On exam there is no edema, erythema, nor warmth. Ant/Post Drawer negative, MCL and LCL intact. TTP over patella and patellar tendon.  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 - Continue PRN pain medication - DG L Knee 4-view  ADDENDUM: Xray negative for arthritis or acute abnormality. Pain is musculoskeletal in nature, possibly patellar tendonitis given location of tenderness. Discussed with patient by phone. - Will treat with 10 days of Mobic given good renal function - Use of brace advised - Limit heavy lifting - Can apply heat or ice sever time per day for relief

## 2019-10-10 NOTE — Progress Notes (Signed)
CC: Knee pain, osteoporosis, establish care   HPI:  Janice Williams is a 58 y.o. F with PMHx listed below presenting for Knee pain, osteoporosis, establish care. Please see the A&P for the status of the patient's chronic medical problems.  Past Medical History:  Diagnosis Date  . Allergy   . Anemia   . Arthritis    lower back, knees  . Bronchitis   . COPD (chronic obstructive pulmonary disease) ([place])   . Former smoker    quit 2014  . GERD (gastroesophageal reflux disease)   . HIV infection ([place])   . Hypertension   . Stroke ([place])   . 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 SVD (spontaneous vaginal delivery)    x 3   Past Surgical History:  Procedure Laterality Date  . MULTIPLE TOOTH EXTRACTIONS     with sedation  . TUBAL LIGATION    . UPPER GI ENDOSCOPY     normal per patient - "years ago"   Family History  Problem Relation Age of Onset  . Hypertension Mother   . Hypertension Father   . Hyperlipidemia Father   . COPD Father   . Atrial fibrillation Father   . Colon cancer Neg Hx   . Rectal cancer Neg Hx   . Stomach cancer Neg Hx    Social History   Socioeconomic History  . Marital status: Single    Spouse name: Not on file  . Number of children: 3  . Years of education: Not on file  . Highest education level: Not on file  Occupational History  . Not on file  Social Needs  . Financial resource strain: Not on file  . Food insecurity    Worry: Not on file    Inability: Not on file  . Transportation needs    Medical: Not on file    Non-medical: Not on file  Tobacco Use  . Smoking status: Former Smoker    Packs/day: 0.40    Years: 38.00    Pack years: 15.20    Types: Cigarettes    Start date: 12/18/2012    Quit date: 11/21/2013    Years since quitting: 5.8  . Smokeless tobacco: Never Used  Substance and Sexual Activity  . Alcohol use: Not Currently    Comment: occasionally  . 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  . Sexual  activity: Not on file  Lifestyle  . Physical activity    Days per week: Not on file    Minutes per session: Not on file  . Stress: Not on file  Relationships  . Social Herbalist on phone: Not on file    Gets together: Not on file    Attends religious service: Not on file    Active member of club or organization: Not on file    Attends meetings of clubs or organizations: Not on file    Relationship status: Not on file  . Intimate partner violence    Fear of current or ex partner: Not on file    Emotionally abused: Not on file    Physically abused: Not on file    Forced sexual activity: Not on file  Other Topics Concern  . Not on file  Social History Narrative  . Not on file   Review of Systems:  Performed and all others negative.  Physical Exam:  Vitals:   10/10/19 0918  BP: 119/87  Pulse: 72  Temp: 98.2 F (36.8 C)  TempSrc: Oral  SpO2: 100%  Weight: 101 lb 4.8 oz (45.9 kg)  Height: 5\' 3"  (1.6 m)   Physical Exam Constitutional:      General: She is not in acute distress.    Appearance: Normal appearance.  Cardiovascular:     Rate and Rhythm: Normal rate and regular rhythm.     Pulses: Normal pulses.     Heart sounds: Normal heart sounds.  Pulmonary:     Effort: Pulmonary effort is normal. No respiratory distress.     Breath sounds: Normal breath sounds.  Abdominal:     General: Bowel sounds are normal. There is no distension.     Palpations: Abdomen is soft.     Tenderness: There is no abdominal tenderness.  Musculoskeletal:        General: No swelling or deformity.     Comments: Knee: - Inspection: No gross deformity. No effusion. No erythema or bruising. Skin intact - Palpation: TTP at patella and patella tendon - ROM: full active ROM with flexion and extension in knee - Strength: 5/5 strength - LIGAMENTS: negative anterior/posterior drawer, no MCL or LCL laxity  - MENISCUS: negative [place]  Skin:    General: Skin is warm and dry.   Neurological:     General: No focal deficit present.     Mental Status: Mental status is at baseline.    Assessment & Plan:   See Encounters Tab for problem based charting.  Patient discussed with Dr. Lynnae January

## 2019-10-11 DIAGNOSIS — I10 Essential (primary) hypertension: Secondary | ICD-10-CM | POA: Diagnosis not present

## 2019-10-11 DIAGNOSIS — J449 Chronic obstructive pulmonary disease, unspecified: Secondary | ICD-10-CM | POA: Diagnosis not present

## 2019-10-11 DIAGNOSIS — B20 Human immunodeficiency virus [HIV] disease: Secondary | ICD-10-CM | POA: Diagnosis not present

## 2019-10-12 DIAGNOSIS — I10 Essential (primary) hypertension: Secondary | ICD-10-CM | POA: Diagnosis not present

## 2019-10-12 DIAGNOSIS — J449 Chronic obstructive pulmonary disease, unspecified: Secondary | ICD-10-CM | POA: Diagnosis not present

## 2019-10-12 DIAGNOSIS — B20 Human immunodeficiency virus [HIV] disease: Secondary | ICD-10-CM | POA: Diagnosis not present

## 2019-10-13 DIAGNOSIS — B20 Human immunodeficiency virus [HIV] disease: Secondary | ICD-10-CM | POA: Diagnosis not present

## 2019-10-13 DIAGNOSIS — J449 Chronic obstructive pulmonary disease, unspecified: Secondary | ICD-10-CM | POA: Diagnosis not present

## 2019-10-13 DIAGNOSIS — I10 Essential (primary) hypertension: Secondary | ICD-10-CM | POA: Diagnosis not present

## 2019-10-14 DIAGNOSIS — J449 Chronic obstructive pulmonary disease, unspecified: Secondary | ICD-10-CM | POA: Diagnosis not present

## 2019-10-14 DIAGNOSIS — I10 Essential (primary) hypertension: Secondary | ICD-10-CM | POA: Diagnosis not present

## 2019-10-14 DIAGNOSIS — B20 Human immunodeficiency virus [HIV] disease: Secondary | ICD-10-CM | POA: Diagnosis not present

## 2019-10-14 MED ORDER — MELOXICAM 15 MG PO TABS: 15.0000 mg | ORAL_TABLET | Freq: Every day | ORAL | 0 refills | Status: AC

## 2019-10-14 NOTE — Addendum Note (Signed)
Addended by: Neva Seat B on: 10/14/2019 02:52 PM   Modules accepted: Orders

## 2019-10-15 DIAGNOSIS — J449 Chronic obstructive pulmonary disease, unspecified: Secondary | ICD-10-CM | POA: Diagnosis not present

## 2019-10-15 DIAGNOSIS — B20 Human immunodeficiency virus [HIV] disease: Secondary | ICD-10-CM | POA: Diagnosis not present

## 2019-10-15 DIAGNOSIS — I10 Essential (primary) hypertension: Secondary | ICD-10-CM | POA: Diagnosis not present

## 2019-10-16 DIAGNOSIS — B20 Human immunodeficiency virus [HIV] disease: Secondary | ICD-10-CM | POA: Diagnosis not present

## 2019-10-16 DIAGNOSIS — I10 Essential (primary) hypertension: Secondary | ICD-10-CM | POA: Diagnosis not present

## 2019-10-16 DIAGNOSIS — J449 Chronic obstructive pulmonary disease, unspecified: Secondary | ICD-10-CM | POA: Diagnosis not present

## 2019-10-17 DIAGNOSIS — I10 Essential (primary) hypertension: Secondary | ICD-10-CM | POA: Diagnosis not present

## 2019-10-17 DIAGNOSIS — J449 Chronic obstructive pulmonary disease, unspecified: Secondary | ICD-10-CM | POA: Diagnosis not present

## 2019-10-17 DIAGNOSIS — B20 Human immunodeficiency virus [HIV] disease: Secondary | ICD-10-CM | POA: Diagnosis not present

## 2019-10-18 DIAGNOSIS — B20 Human immunodeficiency virus [HIV] disease: Secondary | ICD-10-CM | POA: Diagnosis not present

## 2019-10-18 DIAGNOSIS — J449 Chronic obstructive pulmonary disease, unspecified: Secondary | ICD-10-CM | POA: Diagnosis not present

## 2019-10-18 DIAGNOSIS — I10 Essential (primary) hypertension: Secondary | ICD-10-CM | POA: Diagnosis not present

## 2019-10-19 DIAGNOSIS — B20 Human immunodeficiency virus [HIV] disease: Secondary | ICD-10-CM | POA: Diagnosis not present

## 2019-10-19 DIAGNOSIS — I10 Essential (primary) hypertension: Secondary | ICD-10-CM | POA: Diagnosis not present

## 2019-10-19 DIAGNOSIS — J449 Chronic obstructive pulmonary disease, unspecified: Secondary | ICD-10-CM | POA: Diagnosis not present

## 2019-10-20 DIAGNOSIS — J449 Chronic obstructive pulmonary disease, unspecified: Secondary | ICD-10-CM | POA: Diagnosis not present

## 2019-10-20 DIAGNOSIS — I10 Essential (primary) hypertension: Secondary | ICD-10-CM | POA: Diagnosis not present

## 2019-10-20 DIAGNOSIS — B20 Human immunodeficiency virus [HIV] disease: Secondary | ICD-10-CM | POA: Diagnosis not present

## 2019-10-20 MED FILL — BIKTARVY 50-200-25 MG TABS: 50-200-25 | 30 days supply | Qty: 30 | Fill #3

## 2019-10-21 DIAGNOSIS — J449 Chronic obstructive pulmonary disease, unspecified: Secondary | ICD-10-CM | POA: Diagnosis not present

## 2019-10-21 DIAGNOSIS — I10 Essential (primary) hypertension: Secondary | ICD-10-CM | POA: Diagnosis not present

## 2019-10-21 DIAGNOSIS — B20 Human immunodeficiency virus [HIV] disease: Secondary | ICD-10-CM | POA: Diagnosis not present

## 2019-10-22 DIAGNOSIS — B20 Human immunodeficiency virus [HIV] disease: Secondary | ICD-10-CM | POA: Diagnosis not present

## 2019-10-22 DIAGNOSIS — J449 Chronic obstructive pulmonary disease, unspecified: Secondary | ICD-10-CM | POA: Diagnosis not present

## 2019-10-22 DIAGNOSIS — I10 Essential (primary) hypertension: Secondary | ICD-10-CM | POA: Diagnosis not present

## 2019-10-23 DIAGNOSIS — B20 Human immunodeficiency virus [HIV] disease: Secondary | ICD-10-CM | POA: Diagnosis not present

## 2019-10-23 DIAGNOSIS — I10 Essential (primary) hypertension: Secondary | ICD-10-CM | POA: Diagnosis not present

## 2019-10-23 DIAGNOSIS — J449 Chronic obstructive pulmonary disease, unspecified: Secondary | ICD-10-CM | POA: Diagnosis not present

## 2019-10-24 ENCOUNTER — Other Ambulatory Visit: Payer: Self-pay | Admitting: Internal Medicine

## 2019-10-24 DIAGNOSIS — I10 Essential (primary) hypertension: Secondary | ICD-10-CM | POA: Diagnosis not present

## 2019-10-24 DIAGNOSIS — B20 Human immunodeficiency virus [HIV] disease: Secondary | ICD-10-CM | POA: Diagnosis not present

## 2019-10-24 DIAGNOSIS — J449 Chronic obstructive pulmonary disease, unspecified: Secondary | ICD-10-CM | POA: Diagnosis not present

## 2019-10-25 DIAGNOSIS — J449 Chronic obstructive pulmonary disease, unspecified: Secondary | ICD-10-CM | POA: Diagnosis not present

## 2019-10-25 DIAGNOSIS — I10 Essential (primary) hypertension: Secondary | ICD-10-CM | POA: Diagnosis not present

## 2019-10-25 DIAGNOSIS — B20 Human immunodeficiency virus [HIV] disease: Secondary | ICD-10-CM | POA: Diagnosis not present

## 2019-10-26 DIAGNOSIS — J449 Chronic obstructive pulmonary disease, unspecified: Secondary | ICD-10-CM | POA: Diagnosis not present

## 2019-10-26 DIAGNOSIS — I10 Essential (primary) hypertension: Secondary | ICD-10-CM | POA: Diagnosis not present

## 2019-10-26 DIAGNOSIS — B20 Human immunodeficiency virus [HIV] disease: Secondary | ICD-10-CM | POA: Diagnosis not present

## 2019-10-27 DIAGNOSIS — I10 Essential (primary) hypertension: Secondary | ICD-10-CM | POA: Diagnosis not present

## 2019-10-27 DIAGNOSIS — J449 Chronic obstructive pulmonary disease, unspecified: Secondary | ICD-10-CM | POA: Diagnosis not present

## 2019-10-27 DIAGNOSIS — B20 Human immunodeficiency virus [HIV] disease: Secondary | ICD-10-CM | POA: Diagnosis not present

## 2019-10-28 ENCOUNTER — Other Ambulatory Visit: Payer: Self-pay | Admitting: *Deleted

## 2019-10-28 DIAGNOSIS — J301 Allergic rhinitis due to pollen: Secondary | ICD-10-CM

## 2019-10-28 DIAGNOSIS — B20 Human immunodeficiency virus [HIV] disease: Secondary | ICD-10-CM | POA: Diagnosis not present

## 2019-10-28 DIAGNOSIS — I10 Essential (primary) hypertension: Secondary | ICD-10-CM | POA: Diagnosis not present

## 2019-10-28 DIAGNOSIS — J449 Chronic obstructive pulmonary disease, unspecified: Secondary | ICD-10-CM | POA: Diagnosis not present

## 2019-10-28 MED ORDER — FLUTICASONE PROPIONATE 50 MCG/ACT NA SUSP: 2.0000 | Freq: Every day | NASAL | 5 refills | Status: DC

## 2019-10-29 DIAGNOSIS — B20 Human immunodeficiency virus [HIV] disease: Secondary | ICD-10-CM | POA: Diagnosis not present

## 2019-10-29 DIAGNOSIS — I10 Essential (primary) hypertension: Secondary | ICD-10-CM | POA: Diagnosis not present

## 2019-10-29 DIAGNOSIS — J449 Chronic obstructive pulmonary disease, unspecified: Secondary | ICD-10-CM | POA: Diagnosis not present

## 2019-10-30 ENCOUNTER — Telehealth: Payer: Self-pay

## 2019-10-30 DIAGNOSIS — I10 Essential (primary) hypertension: Secondary | ICD-10-CM | POA: Diagnosis not present

## 2019-10-30 DIAGNOSIS — M1612 Unilateral primary osteoarthritis, left hip: Secondary | ICD-10-CM

## 2019-10-30 DIAGNOSIS — J449 Chronic obstructive pulmonary disease, unspecified: Secondary | ICD-10-CM | POA: Diagnosis not present

## 2019-10-30 DIAGNOSIS — B20 Human immunodeficiency virus [HIV] disease: Secondary | ICD-10-CM | POA: Diagnosis not present

## 2019-10-30 NOTE — Telephone Encounter (Signed)
Requesting to speak with a nurse about tramadol. Please call pt back.

## 2019-10-30 NOTE — Telephone Encounter (Signed)
traMADol (ULTRAM) 50 MG tablet, refill request @  [id number] - [place], [place] - [address] [phone] (Phone) [phone] (Fax)    

## 2019-10-31 ENCOUNTER — Other Ambulatory Visit: Payer: Self-pay | Admitting: Internal Medicine

## 2019-10-31 DIAGNOSIS — J449 Chronic obstructive pulmonary disease, unspecified: Secondary | ICD-10-CM | POA: Diagnosis not present

## 2019-10-31 DIAGNOSIS — M1612 Unilateral primary osteoarthritis, left hip: Secondary | ICD-10-CM

## 2019-10-31 DIAGNOSIS — I10 Essential (primary) hypertension: Secondary | ICD-10-CM | POA: Diagnosis not present

## 2019-10-31 DIAGNOSIS — B20 Human immunodeficiency virus [HIV] disease: Secondary | ICD-10-CM | POA: Diagnosis not present

## 2019-10-31 MED ORDER — TRAMADOL HCL 50 MG PO TABS: ORAL_TABLET | ORAL | 0 refills | Status: DC

## 2019-10-31 NOTE — Telephone Encounter (Signed)
RTC to patient, states she has not received tramadol RX she requested yesterday.  Per chart, last RX for Tramadol was sent by Dr. Baxter Flattery and yesterday's request was forwarded to Dr. Baxter Flattery per chart review. Pt states she saw Dr. Trilby Drummer on 10/10/19 and was told when she needed a refill for Tramadol to call him/ [place].  Pt is requesting refill to [facility] on [address] and would like it called in today.  Will send to Dr. Melvin/attending/ and resident o/c. Laurence Compton, RN,BSN

## 2019-10-31 NOTE — Telephone Encounter (Signed)
I saw patient at her recent visit. She has been taking Tramadol chronically per PDMP review. A refill is appropriate, but she will need to discuss a pain contract with her PCP, once one is assigned, when she follows up.

## 2019-10-31 NOTE — Telephone Encounter (Signed)
Will defer to PCP as this is chronic issue

## 2019-10-31 NOTE — Telephone Encounter (Signed)
Pt is requesting a nurse to callback [phone]

## 2019-10-31 NOTE — Progress Notes (Signed)
error 

## 2019-11-01 DIAGNOSIS — B20 Human immunodeficiency virus [HIV] disease: Secondary | ICD-10-CM | POA: Diagnosis not present

## 2019-11-01 DIAGNOSIS — I10 Essential (primary) hypertension: Secondary | ICD-10-CM | POA: Diagnosis not present

## 2019-11-01 DIAGNOSIS — J449 Chronic obstructive pulmonary disease, unspecified: Secondary | ICD-10-CM | POA: Diagnosis not present

## 2019-11-02 DIAGNOSIS — J449 Chronic obstructive pulmonary disease, unspecified: Secondary | ICD-10-CM | POA: Diagnosis not present

## 2019-11-02 DIAGNOSIS — I10 Essential (primary) hypertension: Secondary | ICD-10-CM | POA: Diagnosis not present

## 2019-11-02 DIAGNOSIS — B20 Human immunodeficiency virus [HIV] disease: Secondary | ICD-10-CM | POA: Diagnosis not present

## 2019-11-03 DIAGNOSIS — J449 Chronic obstructive pulmonary disease, unspecified: Secondary | ICD-10-CM | POA: Diagnosis not present

## 2019-11-03 DIAGNOSIS — B20 Human immunodeficiency virus [HIV] disease: Secondary | ICD-10-CM | POA: Diagnosis not present

## 2019-11-03 DIAGNOSIS — I10 Essential (primary) hypertension: Secondary | ICD-10-CM | POA: Diagnosis not present

## 2019-11-04 ENCOUNTER — Other Ambulatory Visit: Payer: Self-pay | Admitting: General Practice

## 2019-11-04 DIAGNOSIS — I10 Essential (primary) hypertension: Secondary | ICD-10-CM | POA: Diagnosis not present

## 2019-11-04 DIAGNOSIS — B20 Human immunodeficiency virus [HIV] disease: Secondary | ICD-10-CM | POA: Diagnosis not present

## 2019-11-04 DIAGNOSIS — J449 Chronic obstructive pulmonary disease, unspecified: Secondary | ICD-10-CM | POA: Diagnosis not present

## 2019-11-04 MED ORDER — MONTELUKAST SODIUM 10 MG PO TABS: 10.0000 mg | ORAL_TABLET | Freq: Every day | ORAL | 0 refills | Status: DC

## 2019-11-04 NOTE — Telephone Encounter (Signed)
Needs refill on montelukast (SINGULAIR) 10 MG tablet  [facility] [id number] - [place], [place] AT [address] ;pt contact [phone]

## 2019-11-05 ENCOUNTER — Other Ambulatory Visit: Payer: Self-pay | Admitting: Internal Medicine

## 2019-11-05 DIAGNOSIS — I10 Essential (primary) hypertension: Secondary | ICD-10-CM | POA: Diagnosis not present

## 2019-11-05 DIAGNOSIS — J449 Chronic obstructive pulmonary disease, unspecified: Secondary | ICD-10-CM | POA: Diagnosis not present

## 2019-11-05 DIAGNOSIS — B20 Human immunodeficiency virus [HIV] disease: Secondary | ICD-10-CM | POA: Diagnosis not present

## 2019-11-05 NOTE — Telephone Encounter (Signed)
Refill Request  Pt requesting a call back  montelukast (SINGULAIR) 10 MG tablet  [facility] - [place], [place] - [address]

## 2019-11-05 NOTE — Telephone Encounter (Signed)
Called pt - informed Singulair was refilled on 11/17 and sent to [facility].

## 2019-11-06 DIAGNOSIS — B20 Human immunodeficiency virus [HIV] disease: Secondary | ICD-10-CM | POA: Diagnosis not present

## 2019-11-06 DIAGNOSIS — J449 Chronic obstructive pulmonary disease, unspecified: Secondary | ICD-10-CM | POA: Diagnosis not present

## 2019-11-06 DIAGNOSIS — I10 Essential (primary) hypertension: Secondary | ICD-10-CM | POA: Diagnosis not present

## 2019-11-07 DIAGNOSIS — I10 Essential (primary) hypertension: Secondary | ICD-10-CM | POA: Diagnosis not present

## 2019-11-07 DIAGNOSIS — J449 Chronic obstructive pulmonary disease, unspecified: Secondary | ICD-10-CM | POA: Diagnosis not present

## 2019-11-07 DIAGNOSIS — B20 Human immunodeficiency virus [HIV] disease: Secondary | ICD-10-CM | POA: Diagnosis not present

## 2019-11-08 DIAGNOSIS — J449 Chronic obstructive pulmonary disease, unspecified: Secondary | ICD-10-CM | POA: Diagnosis not present

## 2019-11-08 DIAGNOSIS — B20 Human immunodeficiency virus [HIV] disease: Secondary | ICD-10-CM | POA: Diagnosis not present

## 2019-11-08 DIAGNOSIS — I10 Essential (primary) hypertension: Secondary | ICD-10-CM | POA: Diagnosis not present

## 2019-11-09 DIAGNOSIS — B20 Human immunodeficiency virus [HIV] disease: Secondary | ICD-10-CM | POA: Diagnosis not present

## 2019-11-09 DIAGNOSIS — I10 Essential (primary) hypertension: Secondary | ICD-10-CM | POA: Diagnosis not present

## 2019-11-09 DIAGNOSIS — J449 Chronic obstructive pulmonary disease, unspecified: Secondary | ICD-10-CM | POA: Diagnosis not present

## 2019-11-10 ENCOUNTER — Telehealth: Payer: Self-pay | Admitting: *Deleted

## 2019-11-10 DIAGNOSIS — J449 Chronic obstructive pulmonary disease, unspecified: Secondary | ICD-10-CM | POA: Diagnosis not present

## 2019-11-10 DIAGNOSIS — I10 Essential (primary) hypertension: Secondary | ICD-10-CM | POA: Diagnosis not present

## 2019-11-10 DIAGNOSIS — B20 Human immunodeficiency virus [HIV] disease: Secondary | ICD-10-CM | POA: Diagnosis not present

## 2019-11-10 NOTE — Telephone Encounter (Signed)
Patient called in stating [facility] has been waiting for Rx on montelukast. This was sent to [place] on 11/04/2019. Left message at [facility] to cancel Rx sent 11/04/2019 and called to [place] at [facility]. Hubbard Hartshorn, BSN, RN-BC

## 2019-11-11 DIAGNOSIS — I10 Essential (primary) hypertension: Secondary | ICD-10-CM | POA: Diagnosis not present

## 2019-11-11 DIAGNOSIS — J449 Chronic obstructive pulmonary disease, unspecified: Secondary | ICD-10-CM | POA: Diagnosis not present

## 2019-11-11 DIAGNOSIS — B20 Human immunodeficiency virus [HIV] disease: Secondary | ICD-10-CM | POA: Diagnosis not present

## 2019-11-12 DIAGNOSIS — I10 Essential (primary) hypertension: Secondary | ICD-10-CM | POA: Diagnosis not present

## 2019-11-12 DIAGNOSIS — B20 Human immunodeficiency virus [HIV] disease: Secondary | ICD-10-CM | POA: Diagnosis not present

## 2019-11-12 DIAGNOSIS — J449 Chronic obstructive pulmonary disease, unspecified: Secondary | ICD-10-CM | POA: Diagnosis not present

## 2019-11-13 DIAGNOSIS — B20 Human immunodeficiency virus [HIV] disease: Secondary | ICD-10-CM | POA: Diagnosis not present

## 2019-11-13 DIAGNOSIS — J449 Chronic obstructive pulmonary disease, unspecified: Secondary | ICD-10-CM | POA: Diagnosis not present

## 2019-11-13 DIAGNOSIS — I10 Essential (primary) hypertension: Secondary | ICD-10-CM | POA: Diagnosis not present

## 2019-11-14 DIAGNOSIS — B20 Human immunodeficiency virus [HIV] disease: Secondary | ICD-10-CM | POA: Diagnosis not present

## 2019-11-14 DIAGNOSIS — I10 Essential (primary) hypertension: Secondary | ICD-10-CM | POA: Diagnosis not present

## 2019-11-14 DIAGNOSIS — J449 Chronic obstructive pulmonary disease, unspecified: Secondary | ICD-10-CM | POA: Diagnosis not present

## 2019-11-15 DIAGNOSIS — B20 Human immunodeficiency virus [HIV] disease: Secondary | ICD-10-CM | POA: Diagnosis not present

## 2019-11-15 DIAGNOSIS — I10 Essential (primary) hypertension: Secondary | ICD-10-CM | POA: Diagnosis not present

## 2019-11-15 DIAGNOSIS — J449 Chronic obstructive pulmonary disease, unspecified: Secondary | ICD-10-CM | POA: Diagnosis not present

## 2019-11-16 DIAGNOSIS — I10 Essential (primary) hypertension: Secondary | ICD-10-CM | POA: Diagnosis not present

## 2019-11-16 DIAGNOSIS — B20 Human immunodeficiency virus [HIV] disease: Secondary | ICD-10-CM | POA: Diagnosis not present

## 2019-11-16 DIAGNOSIS — J449 Chronic obstructive pulmonary disease, unspecified: Secondary | ICD-10-CM | POA: Diagnosis not present

## 2019-11-17 DIAGNOSIS — J449 Chronic obstructive pulmonary disease, unspecified: Secondary | ICD-10-CM | POA: Diagnosis not present

## 2019-11-17 DIAGNOSIS — B20 Human immunodeficiency virus [HIV] disease: Secondary | ICD-10-CM | POA: Diagnosis not present

## 2019-11-17 DIAGNOSIS — I10 Essential (primary) hypertension: Secondary | ICD-10-CM | POA: Diagnosis not present

## 2019-11-18 DIAGNOSIS — I10 Essential (primary) hypertension: Secondary | ICD-10-CM | POA: Diagnosis not present

## 2019-11-18 DIAGNOSIS — J449 Chronic obstructive pulmonary disease, unspecified: Secondary | ICD-10-CM | POA: Diagnosis not present

## 2019-11-18 DIAGNOSIS — B20 Human immunodeficiency virus [HIV] disease: Secondary | ICD-10-CM | POA: Diagnosis not present

## 2019-11-19 DIAGNOSIS — I10 Essential (primary) hypertension: Secondary | ICD-10-CM | POA: Diagnosis not present

## 2019-11-19 DIAGNOSIS — J449 Chronic obstructive pulmonary disease, unspecified: Secondary | ICD-10-CM | POA: Diagnosis not present

## 2019-11-19 DIAGNOSIS — B20 Human immunodeficiency virus [HIV] disease: Secondary | ICD-10-CM | POA: Diagnosis not present

## 2019-11-20 DIAGNOSIS — J449 Chronic obstructive pulmonary disease, unspecified: Secondary | ICD-10-CM | POA: Diagnosis not present

## 2019-11-20 DIAGNOSIS — I10 Essential (primary) hypertension: Secondary | ICD-10-CM | POA: Diagnosis not present

## 2019-11-20 DIAGNOSIS — B20 Human immunodeficiency virus [HIV] disease: Secondary | ICD-10-CM | POA: Diagnosis not present

## 2019-11-24 ENCOUNTER — Telehealth: Payer: Self-pay | Admitting: General Practice

## 2019-11-24 DIAGNOSIS — T7840XD Allergy, unspecified, subsequent encounter: Secondary | ICD-10-CM

## 2019-11-24 NOTE — Telephone Encounter (Signed)
Called pt - requesting refill on Loratadine.

## 2019-11-24 NOTE — Telephone Encounter (Signed)
Pt is requesting a callback [phone]

## 2019-11-27 MED ORDER — LORATADINE 10 MG PO TABS: 10.0000 mg | ORAL_TABLET | Freq: Every day | ORAL | 2 refills | Status: DC

## 2019-11-27 NOTE — Telephone Encounter (Signed)
Pt called / informed of refill. 

## 2019-11-27 NOTE — Telephone Encounter (Signed)
Pt is calling back regarding medicine [phone]

## 2019-11-27 NOTE — Telephone Encounter (Signed)
Refill ordered, Thank you

## 2019-11-28 ENCOUNTER — Other Ambulatory Visit: Payer: Self-pay | Admitting: *Deleted

## 2019-11-28 DIAGNOSIS — M1612 Unilateral primary osteoarthritis, left hip: Secondary | ICD-10-CM

## 2019-11-28 MED ORDER — TRAMADOL HCL 50 MG PO TABS: ORAL_TABLET | ORAL | 0 refills | Status: DC

## 2019-11-28 NOTE — Telephone Encounter (Signed)
Refill approved.

## 2019-12-01 ENCOUNTER — Ambulatory Visit: Payer: Medicaid Other | Admitting: Internal Medicine

## 2019-12-01 DIAGNOSIS — I10 Essential (primary) hypertension: Secondary | ICD-10-CM | POA: Diagnosis not present

## 2019-12-01 DIAGNOSIS — B20 Human immunodeficiency virus [HIV] disease: Secondary | ICD-10-CM | POA: Diagnosis not present

## 2019-12-01 DIAGNOSIS — J449 Chronic obstructive pulmonary disease, unspecified: Secondary | ICD-10-CM | POA: Diagnosis not present

## 2019-12-02 DIAGNOSIS — J449 Chronic obstructive pulmonary disease, unspecified: Secondary | ICD-10-CM | POA: Diagnosis not present

## 2019-12-02 DIAGNOSIS — B20 Human immunodeficiency virus [HIV] disease: Secondary | ICD-10-CM | POA: Diagnosis not present

## 2019-12-02 DIAGNOSIS — I10 Essential (primary) hypertension: Secondary | ICD-10-CM | POA: Diagnosis not present

## 2019-12-03 DIAGNOSIS — I10 Essential (primary) hypertension: Secondary | ICD-10-CM | POA: Diagnosis not present

## 2019-12-03 DIAGNOSIS — J449 Chronic obstructive pulmonary disease, unspecified: Secondary | ICD-10-CM | POA: Diagnosis not present

## 2019-12-03 DIAGNOSIS — B20 Human immunodeficiency virus [HIV] disease: Secondary | ICD-10-CM | POA: Diagnosis not present

## 2019-12-04 DIAGNOSIS — B20 Human immunodeficiency virus [HIV] disease: Secondary | ICD-10-CM | POA: Diagnosis not present

## 2019-12-04 DIAGNOSIS — J449 Chronic obstructive pulmonary disease, unspecified: Secondary | ICD-10-CM | POA: Diagnosis not present

## 2019-12-04 DIAGNOSIS — H40033 Anatomical narrow angle, bilateral: Secondary | ICD-10-CM | POA: Diagnosis not present

## 2019-12-04 DIAGNOSIS — I10 Essential (primary) hypertension: Secondary | ICD-10-CM | POA: Diagnosis not present

## 2019-12-04 DIAGNOSIS — H16223 Keratoconjunctivitis sicca, not specified as Sjogren's, bilateral: Secondary | ICD-10-CM | POA: Diagnosis not present

## 2019-12-05 DIAGNOSIS — J449 Chronic obstructive pulmonary disease, unspecified: Secondary | ICD-10-CM | POA: Diagnosis not present

## 2019-12-05 DIAGNOSIS — I10 Essential (primary) hypertension: Secondary | ICD-10-CM | POA: Diagnosis not present

## 2019-12-05 DIAGNOSIS — B20 Human immunodeficiency virus [HIV] disease: Secondary | ICD-10-CM | POA: Diagnosis not present

## 2019-12-06 DIAGNOSIS — B20 Human immunodeficiency virus [HIV] disease: Secondary | ICD-10-CM | POA: Diagnosis not present

## 2019-12-06 DIAGNOSIS — I10 Essential (primary) hypertension: Secondary | ICD-10-CM | POA: Diagnosis not present

## 2019-12-06 DIAGNOSIS — J449 Chronic obstructive pulmonary disease, unspecified: Secondary | ICD-10-CM | POA: Diagnosis not present

## 2019-12-07 DIAGNOSIS — B20 Human immunodeficiency virus [HIV] disease: Secondary | ICD-10-CM | POA: Diagnosis not present

## 2019-12-07 DIAGNOSIS — I10 Essential (primary) hypertension: Secondary | ICD-10-CM | POA: Diagnosis not present

## 2019-12-07 DIAGNOSIS — J449 Chronic obstructive pulmonary disease, unspecified: Secondary | ICD-10-CM | POA: Diagnosis not present

## 2019-12-22 ENCOUNTER — Other Ambulatory Visit: Payer: Self-pay

## 2019-12-22 ENCOUNTER — Encounter: Payer: Self-pay | Admitting: Internal Medicine

## 2019-12-22 ENCOUNTER — Ambulatory Visit (INDEPENDENT_AMBULATORY_CARE_PROVIDER_SITE_OTHER): Payer: Medicaid Other | Admitting: Internal Medicine

## 2019-12-22 VITALS — BP 138/85 | HR 90 | Temp 97.6°F | Wt 99.0 lb

## 2019-12-22 DIAGNOSIS — B20 Human immunodeficiency virus [HIV] disease: Secondary | ICD-10-CM | POA: Diagnosis not present

## 2019-12-22 DIAGNOSIS — R05 Cough: Secondary | ICD-10-CM | POA: Diagnosis not present

## 2019-12-22 DIAGNOSIS — L989 Disorder of the skin and subcutaneous tissue, unspecified: Secondary | ICD-10-CM

## 2019-12-22 DIAGNOSIS — R059 Cough, unspecified: Secondary | ICD-10-CM

## 2019-12-22 MED ORDER — TRETINOIN 0.025 % EX CREA: TOPICAL_CREAM | Freq: Every day | CUTANEOUS | 0 refills | Status: DC
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
## 2019-12-23 LAB — T-HELPER CELL (CD4) - (RCID CLINIC ONLY)
CD4 % Helper T Cell: 41 % (ref 33–65)
CD4 T Cell Abs: 721 /uL (ref 400–1790)

## 2019-12-26 LAB — CBC WITH DIFFERENTIAL/PLATELET
Absolute Monocytes: 511 cells/uL (ref 200–950)
Basophils Absolute: 18 cells/uL (ref 0–200)
Basophils Relative: 0.4 %
Eosinophils Absolute: 69 cells/uL (ref 15–500)
Eosinophils Relative: 1.5 %
HCT: 35.1 % (ref 35.0–45.0)
Hemoglobin: 12 g/dL (ref 11.7–15.5)
Lymphs Abs: 1886 cells/uL (ref 850–3900)
MCH: 33.2 pg — ABNORMAL HIGH (ref 27.0–33.0)
MCHC: 34.2 g/dL (ref 32.0–36.0)
MCV: 97.2 fL (ref 80.0–100.0)
MPV: 10 fL (ref 7.5–12.5)
Monocytes Relative: 11.1 %
Neutro Abs: 2116 cells/uL (ref 1500–7800)
Neutrophils Relative %: 46 %
Platelets: 276 10*3/uL (ref 140–400)
RBC: 3.61 10*6/uL — ABNORMAL LOW (ref 3.80–5.10)
RDW: 12 % (ref 11.0–15.0)
Total Lymphocyte: 41 %
WBC: 4.6 10*3/uL (ref 3.8–10.8)

## 2019-12-26 LAB — COMPLETE METABOLIC PANEL WITH GFR
AG Ratio: 1.7 (calc) (ref 1.0–2.5)
ALT: 6 U/L (ref 6–29)
AST: 16 U/L (ref 10–35)
Albumin: 4.3 g/dL (ref 3.6–5.1)
Alkaline phosphatase (APISO): 38 U/L (ref 37–153)
BUN: 13 mg/dL (ref 7–25)
CO2: 29 mmol/L (ref 20–32)
Calcium: 9.7 mg/dL (ref 8.6–10.4)
Chloride: 105 mmol/L (ref 98–110)
Creat: 1.02 mg/dL (ref 0.50–1.05)
GFR, Est African American: 70 mL/min/{1.73_m2} (ref >=60)
GFR, Est Non African American: 60 mL/min/{1.73_m2} (ref >=60)
Globulin: 2.5 g/dL (calc) (ref 1.9–3.7)
Glucose, Bld: 130 mg/dL — ABNORMAL HIGH (ref 65–99)
Potassium: 4.4 mmol/L (ref 3.5–5.3)
Sodium: 141 mmol/L (ref 135–146)
Total Bilirubin: 0.6 mg/dL (ref 0.2–1.2)
Total Protein: 6.8 g/dL (ref 6.1–8.1)

## 2019-12-26 LAB — HIV-1 RNA QUANT-NO REFLEX-BLD
HIV 1 RNA Quant: 35 copies/mL — ABNORMAL HIGH
HIV-1 RNA Quant, Log: 1.54 Log copies/mL — ABNORMAL HIGH

## 2019-12-29 ENCOUNTER — Other Ambulatory Visit: Payer: Self-pay | Admitting: Internal Medicine

## 2019-12-29 DIAGNOSIS — M1612 Unilateral primary osteoarthritis, left hip: Secondary | ICD-10-CM

## 2019-12-29 NOTE — Telephone Encounter (Signed)
Refill Request   traMADol (ULTRAM) 50 MG tablet  [id number] - [place], [place]

## 2019-12-31 MED ORDER — TRAMADOL HCL 50 MG PO TABS: ORAL_TABLET | ORAL | 0 refills | Status: DC

## 2019-12-31 NOTE — Telephone Encounter (Signed)
Pt is calling regarding the tramadol; pt contact [phone]

## 2019-12-31 NOTE — Telephone Encounter (Signed)
Please schedule an appointment, either PCP or [facility], within the next 30 days.  She must keep an appointment in order to continue for [place] to refill this medication.  She has only been seen once and has outstanding issues that need to be addressed.

## 2020-01-08 ENCOUNTER — Other Ambulatory Visit: Payer: Self-pay

## 2020-01-08 ENCOUNTER — Ambulatory Visit (INDEPENDENT_AMBULATORY_CARE_PROVIDER_SITE_OTHER): Payer: Medicaid Other | Admitting: Internal Medicine

## 2020-01-08 VITALS — BP 133/88 | HR 79 | Temp 98.4°F | Wt 100.1 lb

## 2020-01-08 DIAGNOSIS — T7840XD Allergy, unspecified, subsequent encounter: Secondary | ICD-10-CM

## 2020-01-08 DIAGNOSIS — Z79891 Long term (current) use of opiate analgesic: Secondary | ICD-10-CM

## 2020-01-08 DIAGNOSIS — L723 Sebaceous cyst: Secondary | ICD-10-CM | POA: Diagnosis not present

## 2020-01-08 DIAGNOSIS — M1990 Unspecified osteoarthritis, unspecified site: Secondary | ICD-10-CM | POA: Diagnosis not present

## 2020-01-08 DIAGNOSIS — Z79899 Other long term (current) drug therapy: Secondary | ICD-10-CM

## 2020-01-08 DIAGNOSIS — J309 Allergic rhinitis, unspecified: Secondary | ICD-10-CM

## 2020-01-08 DIAGNOSIS — M25562 Pain in left knee: Secondary | ICD-10-CM | POA: Diagnosis not present

## 2020-01-08 DIAGNOSIS — G2581 Restless legs syndrome: Secondary | ICD-10-CM

## 2020-01-08 DIAGNOSIS — G8929 Other chronic pain: Secondary | ICD-10-CM

## 2020-01-08 DIAGNOSIS — F1190 Opioid use, unspecified, uncomplicated: Secondary | ICD-10-CM

## 2020-01-08 DIAGNOSIS — J301 Allergic rhinitis due to pollen: Secondary | ICD-10-CM

## 2020-01-08 MED ORDER — LORATADINE 10 MG PO TABS: 10.0000 mg | ORAL_TABLET | Freq: Every day | ORAL | 2 refills | Status: DC

## 2020-01-08 NOTE — Assessment & Plan Note (Signed)
Patient reports good relief with Claritin. Refill sent.

## 2020-01-08 NOTE — Assessment & Plan Note (Signed)
Managed with tramadol chronically as well as voltaren gel. Not due for refill on tramadol today but will obtain UDS as no recent one is available in chart.  Discussed with her that voltaren gel is now OTC.

## 2020-01-08 NOTE — Patient Instructions (Signed)
I have refilled your loratidine. The voltaren gel can now be purchased OTC.  Our office will call you with the results of your blood work today. I have also placed a referral to dermatology.

## 2020-01-08 NOTE — Progress Notes (Signed)
   CC: chronic arthritis, chronic allergies, chronic facial cyst restless legs.  HPI:  [name] is a 59 y.o. female who presents for arthritis and restless legs. Please see problem based assessment and plan for additional details.     Past Medical History:  Diagnosis Date  . Allergy   . Anemia   . Arthritis    lower back, knees  . Bronchitis   . COPD (chronic obstructive pulmonary disease) ([place])   . COPD with acute exacerbation ([place]) 06/09/2017  . Dental abscess 03/15/2015  . DYSPEPSIA 02/14/2007   Qualifier: Diagnosis of  By: Samara Snide    . EXTERNAL OTITIS 01/06/2010   Qualifier: Diagnosis of  By: Tomma Lightning MD, Claiborne Billings    . FACIAL RASH 02/04/2009   Qualifier: Diagnosis of  By: Rhunette Croft    . Former smoker    quit 2014  . GERD (gastroesophageal reflux disease)   . HIV infection ([place])   . Hypertension   . Stroke ([place])   . 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 SVD (spontaneous vaginal delivery)    x 3    Review of Systems:  Review of Systems - General ROS: negative for - chills or fever Musculoskeletal ROS: positive for - joint pain Dermatological ROS: negative for rash   Physical Exam:  Vitals:   01/08/20 0937  BP: 133/88  Pulse: 79  Temp: 98.4 F (36.9 C)  TempSrc: Oral  SpO2: 100%  Weight: 100 lb 1.6 oz (45.4 kg)    GENERAL: well appearing, in no apparent distress SKIN: no rash or lesion on limited exam MSK: bilateral knee exam: no effusion or redness. Crepitus appreciated bilaterally. No pain on palpation. ROM intact.   Assessment & Plan:   See Encounters Tab for problem based charting.  Pertinent labs & imaging results that were available during my care of the patient were reviewed by me and considered in my medical decision making  Patient is in agreement with the plan and endorses no further questions at this time.  Patient discussed with Dr. Adolm Joseph, MD Internal Medicine Resident-PGY1 01/08/20

## 2020-01-08 NOTE — Assessment & Plan Note (Addendum)
Patient notes restless legs that are worse at night and making it difficult to sleep. No anemia present on 1/4 lab work. No recent iron panel to evaluate for iron deficiency. Will obtain that today and replete if low.  If iron is normal, it may be progression of her peripheral neuropathy and could consider trialing something like requip.

## 2020-01-08 NOTE — Assessment & Plan Note (Signed)
Patient notes increase in number and size of facial cysts. Denies drainage or erythema. Located under both eyes and left eyebrow. She notes that she was previously told that dermatology wouldn't accept her with her medicaid insurance. This no longer appears to be the case, so referral placed to dermatology.

## 2020-01-09 LAB — IRON,TIBC AND FERRITIN PANEL
Ferritin: 98 ng/mL (ref 15–150)
Iron Saturation: 15 % (ref 15–55)
Iron: 49 ug/dL (ref 27–159)
Total Iron Binding Capacity: 322 ug/dL (ref 250–450)
UIBC: 273 ug/dL (ref 131–425)

## 2020-01-09 NOTE — Progress Notes (Signed)
Internal Medicine Clinic Attending  Case discussed with Dr. Christian at the time of the visit.  We reviewed the resident's history and exam and pertinent patient test results.  I agree with the assessment, diagnosis, and plan of care documented in the resident's note.    

## 2020-01-14 LAB — TOXASSURE SELECT,+ANTIDEPR,UR

## 2020-01-15 MED FILL — BIKTARVY 50-200-25 MG TABS: 50-200-25 | 30 days supply | Qty: 30 | Fill #4

## 2020-01-16 DIAGNOSIS — B20 Human immunodeficiency virus [HIV] disease: Secondary | ICD-10-CM | POA: Diagnosis not present

## 2020-01-16 DIAGNOSIS — J449 Chronic obstructive pulmonary disease, unspecified: Secondary | ICD-10-CM | POA: Diagnosis not present

## 2020-01-16 DIAGNOSIS — I10 Essential (primary) hypertension: Secondary | ICD-10-CM | POA: Diagnosis not present

## 2020-01-17 DIAGNOSIS — I10 Essential (primary) hypertension: Secondary | ICD-10-CM | POA: Diagnosis not present

## 2020-01-17 DIAGNOSIS — B20 Human immunodeficiency virus [HIV] disease: Secondary | ICD-10-CM | POA: Diagnosis not present

## 2020-01-17 DIAGNOSIS — J449 Chronic obstructive pulmonary disease, unspecified: Secondary | ICD-10-CM | POA: Diagnosis not present

## 2020-01-18 DIAGNOSIS — B20 Human immunodeficiency virus [HIV] disease: Secondary | ICD-10-CM | POA: Diagnosis not present

## 2020-01-18 DIAGNOSIS — I10 Essential (primary) hypertension: Secondary | ICD-10-CM | POA: Diagnosis not present

## 2020-01-18 DIAGNOSIS — J449 Chronic obstructive pulmonary disease, unspecified: Secondary | ICD-10-CM | POA: Diagnosis not present

## 2020-01-19 DIAGNOSIS — J449 Chronic obstructive pulmonary disease, unspecified: Secondary | ICD-10-CM | POA: Diagnosis not present

## 2020-01-19 DIAGNOSIS — I10 Essential (primary) hypertension: Secondary | ICD-10-CM | POA: Diagnosis not present

## 2020-01-19 DIAGNOSIS — B20 Human immunodeficiency virus [HIV] disease: Secondary | ICD-10-CM | POA: Diagnosis not present

## 2020-01-20 DIAGNOSIS — I10 Essential (primary) hypertension: Secondary | ICD-10-CM | POA: Diagnosis not present

## 2020-01-20 DIAGNOSIS — J449 Chronic obstructive pulmonary disease, unspecified: Secondary | ICD-10-CM | POA: Diagnosis not present

## 2020-01-20 DIAGNOSIS — B20 Human immunodeficiency virus [HIV] disease: Secondary | ICD-10-CM | POA: Diagnosis not present

## 2020-01-21 DIAGNOSIS — B20 Human immunodeficiency virus [HIV] disease: Secondary | ICD-10-CM | POA: Diagnosis not present

## 2020-01-21 DIAGNOSIS — I10 Essential (primary) hypertension: Secondary | ICD-10-CM | POA: Diagnosis not present

## 2020-01-21 DIAGNOSIS — J449 Chronic obstructive pulmonary disease, unspecified: Secondary | ICD-10-CM | POA: Diagnosis not present

## 2020-01-22 ENCOUNTER — Other Ambulatory Visit: Payer: Self-pay | Admitting: Internal Medicine

## 2020-01-22 ENCOUNTER — Other Ambulatory Visit: Payer: Self-pay

## 2020-01-22 DIAGNOSIS — B20 Human immunodeficiency virus [HIV] disease: Secondary | ICD-10-CM | POA: Diagnosis not present

## 2020-01-22 DIAGNOSIS — J449 Chronic obstructive pulmonary disease, unspecified: Secondary | ICD-10-CM | POA: Diagnosis not present

## 2020-01-22 DIAGNOSIS — I10 Essential (primary) hypertension: Secondary | ICD-10-CM | POA: Diagnosis not present

## 2020-01-23 DIAGNOSIS — I10 Essential (primary) hypertension: Secondary | ICD-10-CM | POA: Diagnosis not present

## 2020-01-23 DIAGNOSIS — J449 Chronic obstructive pulmonary disease, unspecified: Secondary | ICD-10-CM | POA: Diagnosis not present

## 2020-01-23 DIAGNOSIS — B20 Human immunodeficiency virus [HIV] disease: Secondary | ICD-10-CM | POA: Diagnosis not present

## 2020-01-24 DIAGNOSIS — B20 Human immunodeficiency virus [HIV] disease: Secondary | ICD-10-CM | POA: Diagnosis not present

## 2020-01-24 DIAGNOSIS — I10 Essential (primary) hypertension: Secondary | ICD-10-CM | POA: Diagnosis not present

## 2020-01-24 DIAGNOSIS — J449 Chronic obstructive pulmonary disease, unspecified: Secondary | ICD-10-CM | POA: Diagnosis not present

## 2020-01-25 DIAGNOSIS — I10 Essential (primary) hypertension: Secondary | ICD-10-CM | POA: Diagnosis not present

## 2020-01-25 DIAGNOSIS — B20 Human immunodeficiency virus [HIV] disease: Secondary | ICD-10-CM | POA: Diagnosis not present

## 2020-01-25 DIAGNOSIS — J449 Chronic obstructive pulmonary disease, unspecified: Secondary | ICD-10-CM | POA: Diagnosis not present

## 2020-01-26 DIAGNOSIS — J449 Chronic obstructive pulmonary disease, unspecified: Secondary | ICD-10-CM | POA: Diagnosis not present

## 2020-01-26 DIAGNOSIS — I10 Essential (primary) hypertension: Secondary | ICD-10-CM | POA: Diagnosis not present

## 2020-01-26 DIAGNOSIS — B20 Human immunodeficiency virus [HIV] disease: Secondary | ICD-10-CM | POA: Diagnosis not present

## 2020-01-27 DIAGNOSIS — I10 Essential (primary) hypertension: Secondary | ICD-10-CM | POA: Diagnosis not present

## 2020-01-27 DIAGNOSIS — B20 Human immunodeficiency virus [HIV] disease: Secondary | ICD-10-CM | POA: Diagnosis not present

## 2020-01-27 DIAGNOSIS — J449 Chronic obstructive pulmonary disease, unspecified: Secondary | ICD-10-CM | POA: Diagnosis not present

## 2020-01-28 ENCOUNTER — Other Ambulatory Visit: Payer: Self-pay | Admitting: Internal Medicine

## 2020-01-28 DIAGNOSIS — J449 Chronic obstructive pulmonary disease, unspecified: Secondary | ICD-10-CM | POA: Diagnosis not present

## 2020-01-28 DIAGNOSIS — B20 Human immunodeficiency virus [HIV] disease: Secondary | ICD-10-CM | POA: Diagnosis not present

## 2020-01-28 DIAGNOSIS — M1612 Unilateral primary osteoarthritis, left hip: Secondary | ICD-10-CM

## 2020-01-28 DIAGNOSIS — I10 Essential (primary) hypertension: Secondary | ICD-10-CM | POA: Diagnosis not present

## 2020-01-29 ENCOUNTER — Other Ambulatory Visit: Payer: Self-pay | Admitting: Internal Medicine

## 2020-01-29 DIAGNOSIS — I10 Essential (primary) hypertension: Secondary | ICD-10-CM | POA: Diagnosis not present

## 2020-01-29 DIAGNOSIS — M1612 Unilateral primary osteoarthritis, left hip: Secondary | ICD-10-CM

## 2020-01-29 DIAGNOSIS — B20 Human immunodeficiency virus [HIV] disease: Secondary | ICD-10-CM | POA: Diagnosis not present

## 2020-01-29 DIAGNOSIS — J449 Chronic obstructive pulmonary disease, unspecified: Secondary | ICD-10-CM | POA: Diagnosis not present

## 2020-01-29 NOTE — Telephone Encounter (Signed)
Need refill on traMADol (ULTRAM) 50 MG tablet  :pt contact [facility] [place] AT [place]

## 2020-01-30 DIAGNOSIS — J449 Chronic obstructive pulmonary disease, unspecified: Secondary | ICD-10-CM | POA: Diagnosis not present

## 2020-01-30 DIAGNOSIS — B20 Human immunodeficiency virus [HIV] disease: Secondary | ICD-10-CM | POA: Diagnosis not present

## 2020-01-30 DIAGNOSIS — I10 Essential (primary) hypertension: Secondary | ICD-10-CM | POA: Diagnosis not present

## 2020-01-30 MED ORDER — TRAMADOL HCL 50 MG PO TABS: ORAL_TABLET | ORAL | 0 refills | Status: DC

## 2020-01-30 NOTE — Telephone Encounter (Signed)
Pt is calling back regarding medicine; pt contact [phone]

## 2020-01-30 NOTE — Telephone Encounter (Signed)
I am listed as PCP, but unfortunately I have not seen Ms.Northrup before.   Reviewed chart and PDMP.  Patient has been on tramadol chronically for knee pain. Knee pain was worked up by Dr. Trilby Drummer and no arthritis or acute abnormalities noted. She is a new patient and Dr.Butcher refilled once so patient could make it to appointment.  Patient seen recently in clinic and UDS ordered. Patient is taking the medication per UDS results.I am not sure if a pain contract was filled out on this visit. She will need to fill out a pain contract if she needs me to continue to prescribe Tramadol.   I will send refill today.

## 2020-01-31 DIAGNOSIS — J449 Chronic obstructive pulmonary disease, unspecified: Secondary | ICD-10-CM | POA: Diagnosis not present

## 2020-01-31 DIAGNOSIS — B20 Human immunodeficiency virus [HIV] disease: Secondary | ICD-10-CM | POA: Diagnosis not present

## 2020-01-31 DIAGNOSIS — I10 Essential (primary) hypertension: Secondary | ICD-10-CM | POA: Diagnosis not present

## 2020-02-01 DIAGNOSIS — I10 Essential (primary) hypertension: Secondary | ICD-10-CM | POA: Diagnosis not present

## 2020-02-01 DIAGNOSIS — J449 Chronic obstructive pulmonary disease, unspecified: Secondary | ICD-10-CM | POA: Diagnosis not present

## 2020-02-01 DIAGNOSIS — B20 Human immunodeficiency virus [HIV] disease: Secondary | ICD-10-CM | POA: Diagnosis not present

## 2020-02-02 ENCOUNTER — Telehealth: Payer: Self-pay | Admitting: *Deleted

## 2020-02-02 DIAGNOSIS — I10 Essential (primary) hypertension: Secondary | ICD-10-CM | POA: Diagnosis not present

## 2020-02-02 DIAGNOSIS — J449 Chronic obstructive pulmonary disease, unspecified: Secondary | ICD-10-CM | POA: Diagnosis not present

## 2020-02-02 DIAGNOSIS — B20 Human immunodeficiency virus [HIV] disease: Secondary | ICD-10-CM | POA: Diagnosis not present

## 2020-02-02 NOTE — Telephone Encounter (Signed)
Dr Court Joy has left a well thought out and detailed refill note. He is in [facility] in March.  Please schedule Ms. Tones an appointment in his continuity clinic or in [facility] (add comment to see Dr Court Joy) during March.  Please explain to Ms. Ryles that I know she has been seen recently but this appointment will be to meet her PCP and transition from 30-day refills to 90-day refills if appropriate.  Since she has had a UDS recently and no other outstanding issues, there is a possibility Dr. Court Joy might be okay with a telehealth appointment (won't be able to sign contract but could mail) but I will leave that decision up to him.

## 2020-02-02 NOTE — Telephone Encounter (Signed)
Cancelled tramadol at [facility], called to [facility] per pt request

## 2020-02-03 ENCOUNTER — Other Ambulatory Visit: Payer: Self-pay | Admitting: Family

## 2020-02-03 DIAGNOSIS — B20 Human immunodeficiency virus [HIV] disease: Secondary | ICD-10-CM | POA: Diagnosis not present

## 2020-02-03 DIAGNOSIS — J449 Chronic obstructive pulmonary disease, unspecified: Secondary | ICD-10-CM | POA: Diagnosis not present

## 2020-02-03 DIAGNOSIS — I10 Essential (primary) hypertension: Secondary | ICD-10-CM | POA: Diagnosis not present

## 2020-02-04 DIAGNOSIS — B20 Human immunodeficiency virus [HIV] disease: Secondary | ICD-10-CM | POA: Diagnosis not present

## 2020-02-04 DIAGNOSIS — I10 Essential (primary) hypertension: Secondary | ICD-10-CM | POA: Diagnosis not present

## 2020-02-04 DIAGNOSIS — J449 Chronic obstructive pulmonary disease, unspecified: Secondary | ICD-10-CM | POA: Diagnosis not present

## 2020-02-05 DIAGNOSIS — B20 Human immunodeficiency virus [HIV] disease: Secondary | ICD-10-CM | POA: Diagnosis not present

## 2020-02-05 DIAGNOSIS — J449 Chronic obstructive pulmonary disease, unspecified: Secondary | ICD-10-CM | POA: Diagnosis not present

## 2020-02-05 DIAGNOSIS — I10 Essential (primary) hypertension: Secondary | ICD-10-CM | POA: Diagnosis not present

## 2020-02-06 DIAGNOSIS — J449 Chronic obstructive pulmonary disease, unspecified: Secondary | ICD-10-CM | POA: Diagnosis not present

## 2020-02-06 DIAGNOSIS — I10 Essential (primary) hypertension: Secondary | ICD-10-CM | POA: Diagnosis not present

## 2020-02-06 DIAGNOSIS — B20 Human immunodeficiency virus [HIV] disease: Secondary | ICD-10-CM | POA: Diagnosis not present

## 2020-02-06 NOTE — Telephone Encounter (Signed)
Spoke with the patient appt has been sch for 02/16/2020 @ [facility].

## 2020-02-07 DIAGNOSIS — B20 Human immunodeficiency virus [HIV] disease: Secondary | ICD-10-CM | POA: Diagnosis not present

## 2020-02-07 DIAGNOSIS — I10 Essential (primary) hypertension: Secondary | ICD-10-CM | POA: Diagnosis not present

## 2020-02-07 DIAGNOSIS — J449 Chronic obstructive pulmonary disease, unspecified: Secondary | ICD-10-CM | POA: Diagnosis not present

## 2020-02-08 DIAGNOSIS — I10 Essential (primary) hypertension: Secondary | ICD-10-CM | POA: Diagnosis not present

## 2020-02-08 DIAGNOSIS — B20 Human immunodeficiency virus [HIV] disease: Secondary | ICD-10-CM | POA: Diagnosis not present

## 2020-02-08 DIAGNOSIS — J449 Chronic obstructive pulmonary disease, unspecified: Secondary | ICD-10-CM | POA: Diagnosis not present

## 2020-02-09 DIAGNOSIS — B20 Human immunodeficiency virus [HIV] disease: Secondary | ICD-10-CM | POA: Diagnosis not present

## 2020-02-09 DIAGNOSIS — I10 Essential (primary) hypertension: Secondary | ICD-10-CM | POA: Diagnosis not present

## 2020-02-09 DIAGNOSIS — J449 Chronic obstructive pulmonary disease, unspecified: Secondary | ICD-10-CM | POA: Diagnosis not present

## 2020-02-09 MED FILL — BIKTARVY 50-200-25 MG TABS: 50-200-25 | 30 days supply | Qty: 30 | Fill #0

## 2020-02-10 DIAGNOSIS — B20 Human immunodeficiency virus [HIV] disease: Secondary | ICD-10-CM | POA: Diagnosis not present

## 2020-02-10 DIAGNOSIS — J449 Chronic obstructive pulmonary disease, unspecified: Secondary | ICD-10-CM | POA: Diagnosis not present

## 2020-02-10 DIAGNOSIS — I10 Essential (primary) hypertension: Secondary | ICD-10-CM | POA: Diagnosis not present

## 2020-02-11 DIAGNOSIS — I10 Essential (primary) hypertension: Secondary | ICD-10-CM | POA: Diagnosis not present

## 2020-02-11 DIAGNOSIS — L720 Epidermal cyst: Secondary | ICD-10-CM | POA: Diagnosis not present

## 2020-02-11 DIAGNOSIS — J449 Chronic obstructive pulmonary disease, unspecified: Secondary | ICD-10-CM | POA: Diagnosis not present

## 2020-02-11 DIAGNOSIS — B20 Human immunodeficiency virus [HIV] disease: Secondary | ICD-10-CM | POA: Diagnosis not present

## 2020-02-12 DIAGNOSIS — J449 Chronic obstructive pulmonary disease, unspecified: Secondary | ICD-10-CM | POA: Diagnosis not present

## 2020-02-12 DIAGNOSIS — I10 Essential (primary) hypertension: Secondary | ICD-10-CM | POA: Diagnosis not present

## 2020-02-12 DIAGNOSIS — B20 Human immunodeficiency virus [HIV] disease: Secondary | ICD-10-CM | POA: Diagnosis not present

## 2020-02-13 DIAGNOSIS — B20 Human immunodeficiency virus [HIV] disease: Secondary | ICD-10-CM | POA: Diagnosis not present

## 2020-02-13 DIAGNOSIS — J449 Chronic obstructive pulmonary disease, unspecified: Secondary | ICD-10-CM | POA: Diagnosis not present

## 2020-02-13 DIAGNOSIS — I10 Essential (primary) hypertension: Secondary | ICD-10-CM | POA: Diagnosis not present

## 2020-02-14 DIAGNOSIS — I10 Essential (primary) hypertension: Secondary | ICD-10-CM | POA: Diagnosis not present

## 2020-02-14 DIAGNOSIS — J449 Chronic obstructive pulmonary disease, unspecified: Secondary | ICD-10-CM | POA: Diagnosis not present

## 2020-02-14 DIAGNOSIS — B20 Human immunodeficiency virus [HIV] disease: Secondary | ICD-10-CM | POA: Diagnosis not present

## 2020-02-15 DIAGNOSIS — I10 Essential (primary) hypertension: Secondary | ICD-10-CM | POA: Diagnosis not present

## 2020-02-15 DIAGNOSIS — J449 Chronic obstructive pulmonary disease, unspecified: Secondary | ICD-10-CM | POA: Diagnosis not present

## 2020-02-15 DIAGNOSIS — B20 Human immunodeficiency virus [HIV] disease: Secondary | ICD-10-CM | POA: Diagnosis not present

## 2020-02-16 ENCOUNTER — Encounter: Payer: Self-pay | Admitting: Internal Medicine

## 2020-02-16 ENCOUNTER — Ambulatory Visit: Payer: Medicaid Other | Admitting: Internal Medicine

## 2020-02-16 VITALS — BP 103/82 | HR 91 | Temp 98.5°F | Ht 63.0 in | Wt 96.1 lb

## 2020-02-16 DIAGNOSIS — M25562 Pain in left knee: Secondary | ICD-10-CM | POA: Diagnosis not present

## 2020-02-16 DIAGNOSIS — Z79891 Long term (current) use of opiate analgesic: Secondary | ICD-10-CM

## 2020-02-16 DIAGNOSIS — J449 Chronic obstructive pulmonary disease, unspecified: Secondary | ICD-10-CM | POA: Diagnosis not present

## 2020-02-16 DIAGNOSIS — G8929 Other chronic pain: Secondary | ICD-10-CM | POA: Diagnosis not present

## 2020-02-16 DIAGNOSIS — I10 Essential (primary) hypertension: Secondary | ICD-10-CM | POA: Diagnosis not present

## 2020-02-16 DIAGNOSIS — J309 Allergic rhinitis, unspecified: Secondary | ICD-10-CM

## 2020-02-16 DIAGNOSIS — Z79899 Other long term (current) drug therapy: Secondary | ICD-10-CM

## 2020-02-16 DIAGNOSIS — J301 Allergic rhinitis due to pollen: Secondary | ICD-10-CM

## 2020-02-16 DIAGNOSIS — T7840XD Allergy, unspecified, subsequent encounter: Secondary | ICD-10-CM

## 2020-02-16 DIAGNOSIS — B20 Human immunodeficiency virus [HIV] disease: Secondary | ICD-10-CM | POA: Diagnosis not present

## 2020-02-16 MED ORDER — MONTELUKAST SODIUM 10 MG PO TABS: 10.0000 mg | ORAL_TABLET | Freq: Every day | ORAL | 2 refills | Status: DC

## 2020-02-16 MED ORDER — LORATADINE 10 MG PO TABS: 10.0000 mg | ORAL_TABLET | Freq: Every day | ORAL | 2 refills | Status: DC

## 2020-02-16 NOTE — Assessment & Plan Note (Signed)
Patient reports her allergies are well controlled on Claritin.  P:  Continue Claritin

## 2020-02-16 NOTE — Progress Notes (Signed)
   CC: Chronic pain  HPI:Janice Williams is a 59 y.o. female who presents to discuss her chronic pain medication . Please see individual problem based A/P for details.  Depression, PHQ-9: Based on the patients    Office Visit from 02/16/2020 in [place]  PHQ-9 Total Score  0      Past Medical History:  Diagnosis Date  . Allergy   . Anemia   . Arthritis    lower back, knees  . Bronchitis   . COPD (chronic obstructive pulmonary disease) ([place])   . COPD with acute exacerbation ([place]) 06/09/2017  . Dental abscess 03/15/2015  . DYSPEPSIA 02/14/2007   Qualifier: Diagnosis of  By: Samara Snide    . EXTERNAL OTITIS 01/06/2010   Qualifier: Diagnosis of  By: Tomma Lightning MD, Claiborne Billings    . FACIAL RASH 02/04/2009   Qualifier: Diagnosis of  By: Rhunette Croft    . Former smoker    quit 2014  . GERD (gastroesophageal reflux disease)   . HIV infection ([place])   . Hypertension   . Stroke ([place])   . 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 SVD (spontaneous vaginal delivery)    x 3   Review of Systems: No fever, chills , sob or chest pain.  Physical Exam: Vitals:   02/16/20 1422  BP: 103/82  Pulse: 91  Temp: 98.5 F (36.9 C)  TempSrc: Oral  SpO2: 100%  Weight: 96 lb 1.6 oz (43.6 kg)  Height: 5\' 3"  (1.6 m)    General: Alert, NAD, appears underweight HE: Normocephalic, atraumatic , EOMI, Conjunctivae normal ENT: No congestion, no rhinorrhea moist, no exudate or erythema  Cardiovascular: Normal rate, regular rhythm.  No murmurs, rubs, or gallops Pulmonary : Effort normal, breath sounds normal. No wheezes, rales, or rhonchi Abdominal: soft, nontender,bowel sounds normal Musculoskeletal: no swelling , deformity  Skin: Warm, dry  Psychiatric/Behavioral:  normal mood, normal behavior   Assessment & Plan:   See Encounters Tab for problem based charting.  Patient discussed with Dr. Lynnae January

## 2020-02-16 NOTE — Patient Instructions (Addendum)
Thank you for trusting me with your care. To recap, today we discussed the following:   Pain Contract - Thank you for coming in to fill out the pain contract.   I sent your refills to your pharmacy for montelukast and Claritin.   My best,  Tamsen Snider, MD

## 2020-02-16 NOTE — Assessment & Plan Note (Signed)
Patient has chronic knee pain and cervical spine pain. Imaging has shown mild degenerative disc disease in the cervical spine.  Knee x-ray was normal.  Patient has been on tramadol prescribed by another provider for many years. UDS on last visit show patient is taking the medication. Patient filled out a pain contract with the clinic today.  Plan:  Continue PRN Tramadol 50 mg BID

## 2020-02-17 DIAGNOSIS — I10 Essential (primary) hypertension: Secondary | ICD-10-CM | POA: Diagnosis not present

## 2020-02-17 DIAGNOSIS — J449 Chronic obstructive pulmonary disease, unspecified: Secondary | ICD-10-CM | POA: Diagnosis not present

## 2020-02-17 DIAGNOSIS — B20 Human immunodeficiency virus [HIV] disease: Secondary | ICD-10-CM | POA: Diagnosis not present

## 2020-02-17 NOTE — Progress Notes (Signed)
Internal Medicine Clinic Attending ° °Case discussed with Dr. Steen  at the time of the visit.  We reviewed the resident’s history and exam and pertinent patient test results.  I agree with the assessment, diagnosis, and plan of care documented in the resident’s note.  °

## 2020-02-17 NOTE — Addendum Note (Signed)
Addended by: Larey Dresser A on: 02/17/2020 04:05 PM   Modules accepted: Level of Service

## 2020-02-18 ENCOUNTER — Other Ambulatory Visit: Payer: Self-pay | Admitting: Internal Medicine

## 2020-02-18 DIAGNOSIS — I10 Essential (primary) hypertension: Secondary | ICD-10-CM | POA: Diagnosis not present

## 2020-02-18 DIAGNOSIS — J449 Chronic obstructive pulmonary disease, unspecified: Secondary | ICD-10-CM | POA: Diagnosis not present

## 2020-02-18 DIAGNOSIS — B20 Human immunodeficiency virus [HIV] disease: Secondary | ICD-10-CM | POA: Diagnosis not present

## 2020-02-19 DIAGNOSIS — B20 Human immunodeficiency virus [HIV] disease: Secondary | ICD-10-CM | POA: Diagnosis not present

## 2020-02-19 DIAGNOSIS — I10 Essential (primary) hypertension: Secondary | ICD-10-CM | POA: Diagnosis not present

## 2020-02-19 DIAGNOSIS — J449 Chronic obstructive pulmonary disease, unspecified: Secondary | ICD-10-CM | POA: Diagnosis not present

## 2020-02-20 DIAGNOSIS — I10 Essential (primary) hypertension: Secondary | ICD-10-CM | POA: Diagnosis not present

## 2020-02-20 DIAGNOSIS — B20 Human immunodeficiency virus [HIV] disease: Secondary | ICD-10-CM | POA: Diagnosis not present

## 2020-02-20 DIAGNOSIS — J449 Chronic obstructive pulmonary disease, unspecified: Secondary | ICD-10-CM | POA: Diagnosis not present

## 2020-02-21 DIAGNOSIS — B20 Human immunodeficiency virus [HIV] disease: Secondary | ICD-10-CM | POA: Diagnosis not present

## 2020-02-21 DIAGNOSIS — I10 Essential (primary) hypertension: Secondary | ICD-10-CM | POA: Diagnosis not present

## 2020-02-21 DIAGNOSIS — J449 Chronic obstructive pulmonary disease, unspecified: Secondary | ICD-10-CM | POA: Diagnosis not present

## 2020-02-22 DIAGNOSIS — I10 Essential (primary) hypertension: Secondary | ICD-10-CM | POA: Diagnosis not present

## 2020-02-22 DIAGNOSIS — J449 Chronic obstructive pulmonary disease, unspecified: Secondary | ICD-10-CM | POA: Diagnosis not present

## 2020-02-22 DIAGNOSIS — B20 Human immunodeficiency virus [HIV] disease: Secondary | ICD-10-CM | POA: Diagnosis not present

## 2020-02-22 DIAGNOSIS — H04123 Dry eye syndrome of bilateral lacrimal glands: Secondary | ICD-10-CM | POA: Diagnosis not present

## 2020-02-23 DIAGNOSIS — J449 Chronic obstructive pulmonary disease, unspecified: Secondary | ICD-10-CM | POA: Diagnosis not present

## 2020-02-23 DIAGNOSIS — B20 Human immunodeficiency virus [HIV] disease: Secondary | ICD-10-CM | POA: Diagnosis not present

## 2020-02-23 DIAGNOSIS — I10 Essential (primary) hypertension: Secondary | ICD-10-CM | POA: Diagnosis not present

## 2020-02-24 DIAGNOSIS — I10 Essential (primary) hypertension: Secondary | ICD-10-CM | POA: Diagnosis not present

## 2020-02-24 DIAGNOSIS — J449 Chronic obstructive pulmonary disease, unspecified: Secondary | ICD-10-CM | POA: Diagnosis not present

## 2020-02-24 DIAGNOSIS — B20 Human immunodeficiency virus [HIV] disease: Secondary | ICD-10-CM | POA: Diagnosis not present

## 2020-02-25 DIAGNOSIS — I10 Essential (primary) hypertension: Secondary | ICD-10-CM | POA: Diagnosis not present

## 2020-02-25 DIAGNOSIS — J449 Chronic obstructive pulmonary disease, unspecified: Secondary | ICD-10-CM | POA: Diagnosis not present

## 2020-02-25 DIAGNOSIS — B20 Human immunodeficiency virus [HIV] disease: Secondary | ICD-10-CM | POA: Diagnosis not present

## 2020-02-26 DIAGNOSIS — J449 Chronic obstructive pulmonary disease, unspecified: Secondary | ICD-10-CM | POA: Diagnosis not present

## 2020-02-26 DIAGNOSIS — I10 Essential (primary) hypertension: Secondary | ICD-10-CM | POA: Diagnosis not present

## 2020-02-26 DIAGNOSIS — B20 Human immunodeficiency virus [HIV] disease: Secondary | ICD-10-CM | POA: Diagnosis not present

## 2020-02-27 ENCOUNTER — Other Ambulatory Visit: Payer: Self-pay | Admitting: Internal Medicine

## 2020-02-27 DIAGNOSIS — B20 Human immunodeficiency virus [HIV] disease: Secondary | ICD-10-CM | POA: Diagnosis not present

## 2020-02-27 DIAGNOSIS — M1612 Unilateral primary osteoarthritis, left hip: Secondary | ICD-10-CM

## 2020-02-27 DIAGNOSIS — I10 Essential (primary) hypertension: Secondary | ICD-10-CM | POA: Diagnosis not present

## 2020-02-27 DIAGNOSIS — J449 Chronic obstructive pulmonary disease, unspecified: Secondary | ICD-10-CM | POA: Diagnosis not present

## 2020-02-27 NOTE — Telephone Encounter (Signed)
traMADol (ULTRAM) 50 MG tablet, refill request @  [id number] - [place], [place] - [address] AT [address] [phone] (Phone) [phone] (Fax)    

## 2020-02-28 DIAGNOSIS — J449 Chronic obstructive pulmonary disease, unspecified: Secondary | ICD-10-CM | POA: Diagnosis not present

## 2020-02-28 DIAGNOSIS — I10 Essential (primary) hypertension: Secondary | ICD-10-CM | POA: Diagnosis not present

## 2020-02-28 DIAGNOSIS — B20 Human immunodeficiency virus [HIV] disease: Secondary | ICD-10-CM | POA: Diagnosis not present

## 2020-02-29 DIAGNOSIS — J449 Chronic obstructive pulmonary disease, unspecified: Secondary | ICD-10-CM | POA: Diagnosis not present

## 2020-02-29 DIAGNOSIS — B20 Human immunodeficiency virus [HIV] disease: Secondary | ICD-10-CM | POA: Diagnosis not present

## 2020-02-29 DIAGNOSIS — I10 Essential (primary) hypertension: Secondary | ICD-10-CM | POA: Diagnosis not present

## 2020-03-01 DIAGNOSIS — I10 Essential (primary) hypertension: Secondary | ICD-10-CM | POA: Diagnosis not present

## 2020-03-01 DIAGNOSIS — J449 Chronic obstructive pulmonary disease, unspecified: Secondary | ICD-10-CM | POA: Diagnosis not present

## 2020-03-01 DIAGNOSIS — B20 Human immunodeficiency virus [HIV] disease: Secondary | ICD-10-CM | POA: Diagnosis not present

## 2020-03-02 DIAGNOSIS — B20 Human immunodeficiency virus [HIV] disease: Secondary | ICD-10-CM | POA: Diagnosis not present

## 2020-03-02 DIAGNOSIS — I10 Essential (primary) hypertension: Secondary | ICD-10-CM | POA: Diagnosis not present

## 2020-03-02 DIAGNOSIS — J449 Chronic obstructive pulmonary disease, unspecified: Secondary | ICD-10-CM | POA: Diagnosis not present

## 2020-03-03 DIAGNOSIS — J449 Chronic obstructive pulmonary disease, unspecified: Secondary | ICD-10-CM | POA: Diagnosis not present

## 2020-03-03 DIAGNOSIS — I10 Essential (primary) hypertension: Secondary | ICD-10-CM | POA: Diagnosis not present

## 2020-03-03 DIAGNOSIS — B20 Human immunodeficiency virus [HIV] disease: Secondary | ICD-10-CM | POA: Diagnosis not present

## 2020-03-04 DIAGNOSIS — B20 Human immunodeficiency virus [HIV] disease: Secondary | ICD-10-CM | POA: Diagnosis not present

## 2020-03-04 DIAGNOSIS — I10 Essential (primary) hypertension: Secondary | ICD-10-CM | POA: Diagnosis not present

## 2020-03-04 DIAGNOSIS — J449 Chronic obstructive pulmonary disease, unspecified: Secondary | ICD-10-CM | POA: Diagnosis not present

## 2020-03-05 DIAGNOSIS — I10 Essential (primary) hypertension: Secondary | ICD-10-CM | POA: Diagnosis not present

## 2020-03-05 DIAGNOSIS — J449 Chronic obstructive pulmonary disease, unspecified: Secondary | ICD-10-CM | POA: Diagnosis not present

## 2020-03-05 DIAGNOSIS — B20 Human immunodeficiency virus [HIV] disease: Secondary | ICD-10-CM | POA: Diagnosis not present

## 2020-03-06 DIAGNOSIS — I10 Essential (primary) hypertension: Secondary | ICD-10-CM | POA: Diagnosis not present

## 2020-03-06 DIAGNOSIS — B20 Human immunodeficiency virus [HIV] disease: Secondary | ICD-10-CM | POA: Diagnosis not present

## 2020-03-06 DIAGNOSIS — J449 Chronic obstructive pulmonary disease, unspecified: Secondary | ICD-10-CM | POA: Diagnosis not present

## 2020-03-07 DIAGNOSIS — B20 Human immunodeficiency virus [HIV] disease: Secondary | ICD-10-CM | POA: Diagnosis not present

## 2020-03-07 DIAGNOSIS — I10 Essential (primary) hypertension: Secondary | ICD-10-CM | POA: Diagnosis not present

## 2020-03-07 DIAGNOSIS — J449 Chronic obstructive pulmonary disease, unspecified: Secondary | ICD-10-CM | POA: Diagnosis not present

## 2020-03-08 DIAGNOSIS — B20 Human immunodeficiency virus [HIV] disease: Secondary | ICD-10-CM | POA: Diagnosis not present

## 2020-03-08 DIAGNOSIS — J449 Chronic obstructive pulmonary disease, unspecified: Secondary | ICD-10-CM | POA: Diagnosis not present

## 2020-03-08 DIAGNOSIS — I10 Essential (primary) hypertension: Secondary | ICD-10-CM | POA: Diagnosis not present

## 2020-03-08 MED FILL — BIKTARVY 50-200-25 MG TABS: 50-200-25 | 30 days supply | Qty: 30 | Fill #1

## 2020-03-09 DIAGNOSIS — B20 Human immunodeficiency virus [HIV] disease: Secondary | ICD-10-CM | POA: Diagnosis not present

## 2020-03-09 DIAGNOSIS — J449 Chronic obstructive pulmonary disease, unspecified: Secondary | ICD-10-CM | POA: Diagnosis not present

## 2020-03-09 DIAGNOSIS — I10 Essential (primary) hypertension: Secondary | ICD-10-CM | POA: Diagnosis not present

## 2020-03-10 DIAGNOSIS — I10 Essential (primary) hypertension: Secondary | ICD-10-CM | POA: Diagnosis not present

## 2020-03-10 DIAGNOSIS — J449 Chronic obstructive pulmonary disease, unspecified: Secondary | ICD-10-CM | POA: Diagnosis not present

## 2020-03-10 DIAGNOSIS — B20 Human immunodeficiency virus [HIV] disease: Secondary | ICD-10-CM | POA: Diagnosis not present

## 2020-03-11 DIAGNOSIS — B20 Human immunodeficiency virus [HIV] disease: Secondary | ICD-10-CM | POA: Diagnosis not present

## 2020-03-11 DIAGNOSIS — J449 Chronic obstructive pulmonary disease, unspecified: Secondary | ICD-10-CM | POA: Diagnosis not present

## 2020-03-11 DIAGNOSIS — I10 Essential (primary) hypertension: Secondary | ICD-10-CM | POA: Diagnosis not present

## 2020-03-12 DIAGNOSIS — J449 Chronic obstructive pulmonary disease, unspecified: Secondary | ICD-10-CM | POA: Diagnosis not present

## 2020-03-12 DIAGNOSIS — I10 Essential (primary) hypertension: Secondary | ICD-10-CM | POA: Diagnosis not present

## 2020-03-12 DIAGNOSIS — B20 Human immunodeficiency virus [HIV] disease: Secondary | ICD-10-CM | POA: Diagnosis not present

## 2020-03-13 DIAGNOSIS — I10 Essential (primary) hypertension: Secondary | ICD-10-CM | POA: Diagnosis not present

## 2020-03-13 DIAGNOSIS — B20 Human immunodeficiency virus [HIV] disease: Secondary | ICD-10-CM | POA: Diagnosis not present

## 2020-03-13 DIAGNOSIS — J449 Chronic obstructive pulmonary disease, unspecified: Secondary | ICD-10-CM | POA: Diagnosis not present

## 2020-03-14 DIAGNOSIS — B20 Human immunodeficiency virus [HIV] disease: Secondary | ICD-10-CM | POA: Diagnosis not present

## 2020-03-14 DIAGNOSIS — I10 Essential (primary) hypertension: Secondary | ICD-10-CM | POA: Diagnosis not present

## 2020-03-14 DIAGNOSIS — J449 Chronic obstructive pulmonary disease, unspecified: Secondary | ICD-10-CM | POA: Diagnosis not present

## 2020-03-15 DIAGNOSIS — B20 Human immunodeficiency virus [HIV] disease: Secondary | ICD-10-CM | POA: Diagnosis not present

## 2020-03-15 DIAGNOSIS — J449 Chronic obstructive pulmonary disease, unspecified: Secondary | ICD-10-CM | POA: Diagnosis not present

## 2020-03-15 DIAGNOSIS — I10 Essential (primary) hypertension: Secondary | ICD-10-CM | POA: Diagnosis not present

## 2020-03-16 DIAGNOSIS — I10 Essential (primary) hypertension: Secondary | ICD-10-CM | POA: Diagnosis not present

## 2020-03-16 DIAGNOSIS — B20 Human immunodeficiency virus [HIV] disease: Secondary | ICD-10-CM | POA: Diagnosis not present

## 2020-03-16 DIAGNOSIS — J449 Chronic obstructive pulmonary disease, unspecified: Secondary | ICD-10-CM | POA: Diagnosis not present

## 2020-03-17 ENCOUNTER — Other Ambulatory Visit: Payer: Self-pay | Admitting: Internal Medicine

## 2020-03-17 DIAGNOSIS — K2100 Gastro-esophageal reflux disease with esophagitis, without bleeding: Secondary | ICD-10-CM

## 2020-03-17 DIAGNOSIS — I10 Essential (primary) hypertension: Secondary | ICD-10-CM

## 2020-03-17 DIAGNOSIS — J449 Chronic obstructive pulmonary disease, unspecified: Secondary | ICD-10-CM | POA: Diagnosis not present

## 2020-03-17 DIAGNOSIS — B20 Human immunodeficiency virus [HIV] disease: Secondary | ICD-10-CM | POA: Diagnosis not present

## 2020-03-17 DIAGNOSIS — E785 Hyperlipidemia, unspecified: Secondary | ICD-10-CM

## 2020-03-18 DIAGNOSIS — B20 Human immunodeficiency virus [HIV] disease: Secondary | ICD-10-CM | POA: Diagnosis not present

## 2020-03-18 DIAGNOSIS — J449 Chronic obstructive pulmonary disease, unspecified: Secondary | ICD-10-CM | POA: Diagnosis not present

## 2020-03-18 DIAGNOSIS — I10 Essential (primary) hypertension: Secondary | ICD-10-CM | POA: Diagnosis not present

## 2020-03-19 DIAGNOSIS — J449 Chronic obstructive pulmonary disease, unspecified: Secondary | ICD-10-CM | POA: Diagnosis not present

## 2020-03-19 DIAGNOSIS — B20 Human immunodeficiency virus [HIV] disease: Secondary | ICD-10-CM | POA: Diagnosis not present

## 2020-03-19 DIAGNOSIS — I10 Essential (primary) hypertension: Secondary | ICD-10-CM | POA: Diagnosis not present

## 2020-03-20 DIAGNOSIS — J449 Chronic obstructive pulmonary disease, unspecified: Secondary | ICD-10-CM | POA: Diagnosis not present

## 2020-03-20 DIAGNOSIS — B20 Human immunodeficiency virus [HIV] disease: Secondary | ICD-10-CM | POA: Diagnosis not present

## 2020-03-20 DIAGNOSIS — I10 Essential (primary) hypertension: Secondary | ICD-10-CM | POA: Diagnosis not present

## 2020-03-21 DIAGNOSIS — B20 Human immunodeficiency virus [HIV] disease: Secondary | ICD-10-CM | POA: Diagnosis not present

## 2020-03-21 DIAGNOSIS — I10 Essential (primary) hypertension: Secondary | ICD-10-CM | POA: Diagnosis not present

## 2020-03-21 DIAGNOSIS — J449 Chronic obstructive pulmonary disease, unspecified: Secondary | ICD-10-CM | POA: Diagnosis not present

## 2020-03-24 DIAGNOSIS — L720 Epidermal cyst: Secondary | ICD-10-CM | POA: Diagnosis not present

## 2020-03-30 DIAGNOSIS — L720 Epidermal cyst: Secondary | ICD-10-CM | POA: Diagnosis not present

## 2020-03-30 DIAGNOSIS — Z4802 Encounter for removal of sutures: Secondary | ICD-10-CM | POA: Diagnosis not present

## 2020-04-02 DIAGNOSIS — H1013 Acute atopic conjunctivitis, bilateral: Secondary | ICD-10-CM | POA: Diagnosis not present

## 2020-04-06 MED FILL — BIKTARVY 50-200-25 MG TABS: 50-200-25 | 30 days supply | Qty: 30 | Fill #2

## 2020-04-12 DIAGNOSIS — B20 Human immunodeficiency virus [HIV] disease: Secondary | ICD-10-CM | POA: Diagnosis not present

## 2020-04-13 ENCOUNTER — Other Ambulatory Visit: Payer: Self-pay | Admitting: Internal Medicine

## 2020-04-13 DIAGNOSIS — T7840XD Allergy, unspecified, subsequent encounter: Secondary | ICD-10-CM

## 2020-04-13 DIAGNOSIS — B20 Human immunodeficiency virus [HIV] disease: Secondary | ICD-10-CM | POA: Diagnosis not present

## 2020-04-14 DIAGNOSIS — B20 Human immunodeficiency virus [HIV] disease: Secondary | ICD-10-CM | POA: Diagnosis not present

## 2020-04-15 DIAGNOSIS — B20 Human immunodeficiency virus [HIV] disease: Secondary | ICD-10-CM | POA: Diagnosis not present

## 2020-04-16 DIAGNOSIS — B20 Human immunodeficiency virus [HIV] disease: Secondary | ICD-10-CM | POA: Diagnosis not present

## 2020-04-21 ENCOUNTER — Encounter: Payer: Self-pay | Admitting: Internal Medicine

## 2020-04-21 ENCOUNTER — Ambulatory Visit (INDEPENDENT_AMBULATORY_CARE_PROVIDER_SITE_OTHER): Payer: Medicaid Other | Admitting: Internal Medicine

## 2020-04-21 ENCOUNTER — Other Ambulatory Visit: Payer: Self-pay

## 2020-04-21 VITALS — BP 155/84 | HR 66 | Temp 98.2°F | Ht 63.0 in | Wt 98.0 lb

## 2020-04-21 DIAGNOSIS — B20 Human immunodeficiency virus [HIV] disease: Secondary | ICD-10-CM | POA: Diagnosis not present

## 2020-04-21 MED ORDER — MEGESTROL ACETATE 40 MG PO TABS: 40.0000 mg | ORAL_TABLET | Freq: Every day | ORAL | 1 refills | Status: DC
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
## 2020-04-23 LAB — HIV-1 RNA QUANT-NO REFLEX-BLD
HIV 1 RNA Quant: <20 copies/mL — AB
HIV-1 RNA Quant, Log: <1.3 Log copies/mL — AB

## 2020-04-26 DIAGNOSIS — B20 Human immunodeficiency virus [HIV] disease: Secondary | ICD-10-CM | POA: Diagnosis not present

## 2020-04-28 DIAGNOSIS — B20 Human immunodeficiency virus [HIV] disease: Secondary | ICD-10-CM | POA: Diagnosis not present

## 2020-04-29 DIAGNOSIS — B20 Human immunodeficiency virus [HIV] disease: Secondary | ICD-10-CM | POA: Diagnosis not present

## 2020-04-30 DIAGNOSIS — B20 Human immunodeficiency virus [HIV] disease: Secondary | ICD-10-CM | POA: Diagnosis not present

## 2020-05-01 DIAGNOSIS — B20 Human immunodeficiency virus [HIV] disease: Secondary | ICD-10-CM | POA: Diagnosis not present

## 2020-05-02 DIAGNOSIS — B20 Human immunodeficiency virus [HIV] disease: Secondary | ICD-10-CM | POA: Diagnosis not present

## 2020-05-03 DIAGNOSIS — B20 Human immunodeficiency virus [HIV] disease: Secondary | ICD-10-CM | POA: Diagnosis not present

## 2020-05-04 DIAGNOSIS — B20 Human immunodeficiency virus [HIV] disease: Secondary | ICD-10-CM | POA: Diagnosis not present

## 2020-05-04 MED FILL — BIKTARVY 50-200-25 MG TABS: 50-200-25 | 30 days supply | Qty: 30 | Fill #3

## 2020-05-05 DIAGNOSIS — B20 Human immunodeficiency virus [HIV] disease: Secondary | ICD-10-CM | POA: Diagnosis not present

## 2020-05-06 DIAGNOSIS — B20 Human immunodeficiency virus [HIV] disease: Secondary | ICD-10-CM | POA: Diagnosis not present

## 2020-05-07 DIAGNOSIS — B20 Human immunodeficiency virus [HIV] disease: Secondary | ICD-10-CM | POA: Diagnosis not present

## 2020-05-10 DIAGNOSIS — B20 Human immunodeficiency virus [HIV] disease: Secondary | ICD-10-CM | POA: Diagnosis not present

## 2020-05-11 DIAGNOSIS — B20 Human immunodeficiency virus [HIV] disease: Secondary | ICD-10-CM | POA: Diagnosis not present

## 2020-05-11 DIAGNOSIS — L720 Epidermal cyst: Secondary | ICD-10-CM | POA: Diagnosis not present

## 2020-05-12 DIAGNOSIS — B20 Human immunodeficiency virus [HIV] disease: Secondary | ICD-10-CM | POA: Diagnosis not present

## 2020-05-13 DIAGNOSIS — B20 Human immunodeficiency virus [HIV] disease: Secondary | ICD-10-CM | POA: Diagnosis not present

## 2020-05-14 DIAGNOSIS — B20 Human immunodeficiency virus [HIV] disease: Secondary | ICD-10-CM | POA: Diagnosis not present

## 2020-05-15 DIAGNOSIS — B20 Human immunodeficiency virus [HIV] disease: Secondary | ICD-10-CM | POA: Diagnosis not present

## 2020-05-16 DIAGNOSIS — B20 Human immunodeficiency virus [HIV] disease: Secondary | ICD-10-CM | POA: Diagnosis not present

## 2020-05-17 ENCOUNTER — Other Ambulatory Visit: Payer: Self-pay | Admitting: Internal Medicine

## 2020-05-17 DIAGNOSIS — I10 Essential (primary) hypertension: Secondary | ICD-10-CM

## 2020-05-17 DIAGNOSIS — B20 Human immunodeficiency virus [HIV] disease: Secondary | ICD-10-CM | POA: Diagnosis not present

## 2020-05-21 DIAGNOSIS — H43393 Other vitreous opacities, bilateral: Secondary | ICD-10-CM | POA: Diagnosis not present

## 2020-05-24 DIAGNOSIS — B20 Human immunodeficiency virus [HIV] disease: Secondary | ICD-10-CM | POA: Diagnosis not present

## 2020-05-27 ENCOUNTER — Other Ambulatory Visit: Payer: Self-pay | Admitting: Internal Medicine

## 2020-05-27 DIAGNOSIS — M1612 Unilateral primary osteoarthritis, left hip: Secondary | ICD-10-CM

## 2020-05-28 DIAGNOSIS — B20 Human immunodeficiency virus [HIV] disease: Secondary | ICD-10-CM | POA: Diagnosis not present

## 2020-05-28 NOTE — Telephone Encounter (Signed)
Patient called in today requesting refill on Tramdol.  Informed patient it has already been sent to her doctor for review.

## 2020-05-29 DIAGNOSIS — B20 Human immunodeficiency virus [HIV] disease: Secondary | ICD-10-CM | POA: Diagnosis not present

## 2020-05-30 DIAGNOSIS — B20 Human immunodeficiency virus [HIV] disease: Secondary | ICD-10-CM | POA: Diagnosis not present

## 2020-05-31 ENCOUNTER — Telehealth: Payer: Self-pay | Admitting: Internal Medicine

## 2020-05-31 DIAGNOSIS — B20 Human immunodeficiency virus [HIV] disease: Secondary | ICD-10-CM | POA: Diagnosis not present

## 2020-05-31 NOTE — Telephone Encounter (Signed)
Pt requesting a callback [phone]

## 2020-05-31 NOTE — Telephone Encounter (Signed)
Rtc, message states number not in service.

## 2020-06-01 DIAGNOSIS — B20 Human immunodeficiency virus [HIV] disease: Secondary | ICD-10-CM | POA: Diagnosis not present

## 2020-06-01 NOTE — Telephone Encounter (Signed)
Return pt's call - she wanted to know if Tramadol was refilled and sent to [facility]. Informed pt it was refilled today. Stated thank you.

## 2020-06-01 NOTE — Telephone Encounter (Signed)
Pt called back to give her correct number. Pls call [phone] to reach her.

## 2020-06-01 NOTE — Telephone Encounter (Signed)
Patient notified Rx has been sent and is very appreciative. Hubbard Hartshorn, BSN, RN-BC

## 2020-06-01 NOTE — Telephone Encounter (Signed)
Reviewed database, refill history appropriate. Last Utox appropriate as well. Approved 1 month refill of tramadol, sent to [facility] on [address].

## 2020-06-02 ENCOUNTER — Other Ambulatory Visit: Payer: Self-pay

## 2020-06-02 ENCOUNTER — Ambulatory Visit (HOSPITAL_COMMUNITY)
Admission: EM | Admit: 2020-06-02 | Discharge: 2020-06-02 | Disposition: A | Discharge status: Home / Self Care | Payer: Medicaid Other | Attending: Emergency Medicine | Admitting: Emergency Medicine

## 2020-06-02 ENCOUNTER — Encounter (HOSPITAL_COMMUNITY): Payer: Self-pay | Admitting: Emergency Medicine

## 2020-06-02 DIAGNOSIS — B20 Human immunodeficiency virus [HIV] disease: Secondary | ICD-10-CM | POA: Diagnosis not present

## 2020-06-02 DIAGNOSIS — J019 Acute sinusitis, unspecified: Secondary | ICD-10-CM

## 2020-06-02 DIAGNOSIS — J441 Chronic obstructive pulmonary disease with (acute) exacerbation: Secondary | ICD-10-CM | POA: Diagnosis not present

## 2020-06-02 MED ORDER — DOXYCYCLINE HYCLATE 100 MG PO CAPS: 100.0000 mg | ORAL_CAPSULE | Freq: Two times a day (BID) | ORAL | 0 refills | Status: AC

## 2020-06-02 MED ORDER — PREDNISONE 50 MG PO TABS: 50.0000 mg | ORAL_TABLET | Freq: Every day | ORAL | 0 refills | Status: AC

## 2020-06-02 MED ORDER — ALBUTEROL SULFATE HFA 108 (90 BASE) MCG/ACT IN AERS
INHALATION_SPRAY | RESPIRATORY_TRACT | Status: AC
  Filled 2020-06-02: qty 6.7

## 2020-06-02 MED ORDER — ALBUTEROL SULFATE HFA 108 (90 BASE) MCG/ACT IN AERS
2.0000 | INHALATION_SPRAY | Freq: Once | RESPIRATORY_TRACT | Status: AC
  Administered 2020-06-02: 2 via RESPIRATORY_TRACT

## 2020-06-02 MED ORDER — BENZONATATE 200 MG PO CAPS
200.0000 mg | ORAL_CAPSULE | Freq: Three times a day (TID) | ORAL | 0 refills | Status: AC | PRN
Start: 2020-06-02 — End: 2020-06-09

## 2020-06-02 MED ORDER — BUDESONIDE-FORMOTEROL FUMARATE 80-4.5 MCG/ACT IN AERO: INHALATION_SPRAY | RESPIRATORY_TRACT | 0 refills | Status: DC

## 2020-06-02 MED ORDER — FLUTICASONE PROPIONATE 50 MCG/ACT NA SUSP: 1.0000 | Freq: Every day | NASAL | 0 refills | Status: DC

## 2020-06-02 MED ORDER — DOXYCYCLINE HYCLATE 100 MG PO CAPS
100.0000 mg | ORAL_CAPSULE | Freq: Two times a day (BID) | ORAL | 0 refills | Status: DC
Start: 2020-06-02 — End: 2020-06-02

## 2020-06-02 NOTE — ED Triage Notes (Signed)
Pt c/o bilat ear pain onset x 2 weeks ago. Pt also c/o SOB since she lost her inhaler yesterday. She states she was recently diagnosed with bronchitis.

## 2020-06-02 NOTE — Discharge Instructions (Signed)
Albuterol provided, Symbicort refilled Begin prednisone daily for 5 days with food Doxycycline twice daily for 10 days Tessalon/benzonatate every 8 hours for cough Flonase nasal spray 1-2 spray in each nostril daily  Please follow-up if any symptoms not improving or worsening

## 2020-06-03 ENCOUNTER — Telehealth: Payer: Self-pay | Admitting: *Deleted

## 2020-06-03 DIAGNOSIS — B20 Human immunodeficiency virus [HIV] disease: Secondary | ICD-10-CM | POA: Diagnosis not present

## 2020-06-03 MED FILL — BIKTARVY 50-200-25 MG TABS: 50-200-25 | 30 days supply | Qty: 30 | Fill #4

## 2020-06-03 NOTE — Telephone Encounter (Signed)
Information was sent through [employer] for [place] for Tramadol 50 mg tablets.  Awaiting determination within 24 hours.  Sander Nephew, RN 06/03/2020 11:23 AM.

## 2020-06-03 NOTE — ED Provider Notes (Signed)
[place]    CSN: [id number] Arrival date & time: 06/02/20  1813      History   Chief Complaint Chief Complaint  Patient presents with  . Otalgia    HPI Janice Williams is a 59 y.o. female presenting today for ear and sinus pain.  Patient reports over the past 2 weeks she has had bilateral ear pain along with sinus pressure and congestion.  She also reports increasing cough, wheezing and shortness of breath.  Does feel similar to prior COPD exacerbations.  Reports recently lost her inhaler.  She denies any fevers.  Denies sore throat.  HPI  Past Medical History:  Diagnosis Date  . Allergy   . Anemia   . Arthritis    lower back, knees  . Bronchitis   . COPD (chronic obstructive pulmonary disease) ([place])   . COPD with acute exacerbation ([place]) 06/09/2017  . Dental abscess 03/15/2015  . DYSPEPSIA 02/14/2007   Qualifier: Diagnosis of  By: Samara Snide    . EXTERNAL OTITIS 01/06/2010   Qualifier: Diagnosis of  By: Tomma Lightning MD, Claiborne Billings    . FACIAL RASH 02/04/2009   Qualifier: Diagnosis of  By: Rhunette Croft    . Former smoker    quit 2014  . GERD (gastroesophageal reflux disease)   . HIV infection ([place])   . Hypertension   . Stroke ([place])   . 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 SVD (spontaneous vaginal delivery)    x 3    Patient Active Problem List   Diagnosis Date Noted  . Restless legs syndrome (RLS) 01/08/2020  . Osteoporosis 10/10/2019  . COPD gold ?  07/08/2019  . Moderate protein-calorie malnutrition ([place]) 05/31/2018  . Hypertension 06/08/2017  . Left knee pain 03/15/2015  . Acne cystica 12/22/2011  . Dyslipidemia 07/06/2011  . Healthcare maintenance 07/06/2011  . EPIDERMOID CYST 02/10/2011  . Hyperlipidemia 06/03/2009  . WEIGHT LOSS 06/03/2009  . CARBUNCLE AND FURUNCLE OF FACE 02/04/2009  . Human immunodeficiency virus (HIV) disease ([place]) 10/29/2008  . ANEMIA-NOS 10/29/2008  . PERIPHERAL NEUROPATHY 10/29/2008  . GERD 10/29/2008  .  RENAL INSUFFICIENCY 10/29/2008  . CEREBROVASCULAR ACCIDENT, HX OF 10/29/2008  . TOBACCO DEPENDENCE 02/14/2007  . CVA 02/14/2007  . Allergic rhinitis 02/14/2007  . ACNE 02/14/2007  . WEIGHT LOSS, ABNORMAL 02/14/2007    Past Surgical History:  Procedure Laterality Date  . MULTIPLE TOOTH EXTRACTIONS     with sedation  . TUBAL LIGATION    . UPPER GI ENDOSCOPY     normal per patient - "years ago"    OB History    Gravida  3   Para  3   Term  3   Preterm      AB      Living  3     SAB      TAB      Ectopic      Multiple      Live Births  3            Home Medications    Prior to Admission medications   Medication Sig Start Date End Date Taking? Authorizing Provider  albuterol (PROVENTIL HFA;VENTOLIN HFA) 108 (90 Base) MCG/ACT inhaler Inhale 2 puffs into the lungs every 6 (six) hours as needed for wheezing or shortness of breath. 02/27/19   Carlyle Basques, MD  amLODipine (NORVASC) 10 MG tablet TAKE ONE TABLET BY MOUTH DAILY 02/18/20   Carlyle Basques, MD  benzonatate (  TESSALON) 200 MG capsule Take 1 capsule (200 mg total) by mouth 3 (three) times daily as needed for up to 7 days for cough. 06/02/20 06/09/20  Kacyn Souder C, PA-C  BIKTARVY 50-200-25 MG TABS tablet TAKE 1 TABLET BY MOUTH DAILY. 02/03/20   Carlyle Basques, MD  budesonide-formoterol [facility]) 80-4.5 MCG/ACT inhaler Take 2 puffs first thing in am and then another 2 puffs about 12 hours later. 06/02/20   Sanjith Siwek C, PA-C  calcium citrate-vitamin D (CITRACAL+D) 315-200 MG-UNIT tablet Take 2 tablets by mouth 2 (two) times daily. Patient not taking: Reported on 04/21/2020 10/10/19 10/09/20  Neva Seat, MD  diclofenac sodium (VOLTAREN) 1 % GEL Apply 2 g topically 4 (four) times daily as needed. Patient not taking: Reported on 04/21/2020 10/10/19   Neva Seat, MD  doxycycline (VIBRAMYCIN) 100 MG capsule Take 1 capsule (100 mg total) by mouth 2 (two) times daily for 10 days. 06/02/20 06/12/20   Glennys Schorsch C, PA-C  Ensure (ENSURE) Take 237 mLs by mouth 3 (three) times daily between meals. 06/17/19   Carlyle Basques, MD  fluticasone (FLONASE) 50 MCG/ACT nasal spray Place 1-2 sprays into both nostrils daily for 7 days. 06/02/20 06/09/20  Maximino Cozzolino C, PA-C  hydrocortisone 2.5 % cream Apply rectally 2 times daily 09/07/18   [provider]  loratadine (CLARITIN) 10 MG tablet Take 1 tablet (10 mg total) by mouth daily. 04/13/20   Madalyn Rob, MD  megestrol (MEGACE) 40 MG tablet Take 1 tablet (40 mg total) by mouth daily. 04/21/20   Carlyle Basques, MD  montelukast (SINGULAIR) 10 MG tablet Take 1 tablet (10 mg total) by mouth at bedtime. 02/16/20   Madalyn Rob, MD  omeprazole (PRILOSEC) 20 MG capsule TAKE 1 CAPSULE BY MOUTH EVERY DAY 03/17/20   Carlyle Basques, MD  ondansetron (ZOFRAN) 4 MG tablet Take 1 tablet (4 mg total) by mouth every 8 (eight) hours as needed for nausea or vomiting. 02/10/19   Carlyle Basques, MD  polyethylene glycol [facility] Floria Raveling) packet Take one capful in 8 oz of fluid of your choice twice a day until having soft stools, then back down to once a day.  If not having soft stools with twice a day, increased to three times a day 07/21/15   Linton Flemings, MD  pramoxine (PROCTOFOAM) 1 % foam Place 1 application rectally 3 (three) times daily as needed. Patient not taking: Reported on 04/21/2020 09/17/18   Carlyle Basques, MD  pravastatin (PRAVACHOL) 20 MG tablet TAKE 1 TABLET BY MOUTH EVERY DAY 03/17/20   Carlyle Basques, MD  predniSONE (DELTASONE) 50 MG tablet Take 1 tablet (50 mg total) by mouth daily for 5 days. 06/02/20 06/07/20  Azriel Dancy C, PA-C  traMADol (ULTRAM) 50 MG tablet TAKE 1 TABLET(50 MG) BY MOUTH EVERY 12 HOURS AS NEEDED FOR PAIN 06/01/20   Velna Ochs, MD  Witch Hazel (TUCKS) 50 % PADS Use as needed for rectal itching, after having BM 07/21/15   Linton Flemings, MD    Family History Family History  Problem Relation Age of Onset  . Hypertension  Mother   . Hypertension Father   . Hyperlipidemia Father   . COPD Father   . Atrial fibrillation Father   . Colon cancer Neg Hx   . Rectal cancer Neg Hx   . Stomach cancer Neg Hx     Social History Social History   Tobacco Use  . Smoking status: Former Smoker    Packs/day: 0.40    Years: 38.00  Pack years: 15.20    Types: Cigarettes    Start date: 12/18/2012    Quit date: 11/21/2013    Years since quitting: 6.5  . Smokeless tobacco: Never Used  Vaping Use  . Vaping Use: Never used  Substance Use Topics  . Alcohol use: Not Currently    Comment: occasionally  . 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     Allergies   Chantix [varenicline]   Review of Systems Review of Systems  Constitutional: Positive for fatigue. Negative for activity change, appetite change, chills and fever.  HENT: Positive for congestion, ear pain and rhinorrhea. Negative for sinus pressure, sore throat and trouble swallowing.   Eyes: Negative for discharge and redness.  Respiratory: Positive for cough, shortness of breath and wheezing. Negative for chest tightness.   Cardiovascular: Negative for chest pain.  Gastrointestinal: Negative for abdominal pain, diarrhea, nausea and vomiting.  Musculoskeletal: Negative for myalgias.  Skin: Negative for rash.  Neurological: Negative for dizziness, light-headedness and headaches.     Physical Exam Triage Vital Signs ED Triage Vitals  Enc Vitals Group     BP 06/02/20 1907 (!) 130/94     Pulse Rate 06/02/20 1907 85     Resp 06/02/20 1907 14     Temp 06/02/20 1907 98.8 F (37.1 C)     Temp Source 06/02/20 1907 Oral     SpO2 06/02/20 1907 98 %     Weight --      Height --      Head Circumference --      Peak Flow --      Pain Score 06/02/20 1905 8     Pain Loc --      Pain Edu? --      Excl. in [place]? --    No data found.  Updated Vital Signs BP (!) 130/94 (BP Location: Left Arm)   Pulse 85   Temp 98.8 F (37.1 C) (Oral)   Resp 14    SpO2 98%   Visual Acuity Right Eye Distance:   Left Eye Distance:   Bilateral Distance:    Right Eye Near:   Left Eye Near:    Bilateral Near:     Physical Exam Vitals and nursing note reviewed.  Constitutional:      Appearance: She is well-developed.     Comments: No acute distress  HENT:     Head: Normocephalic and atraumatic.     Ears:     Comments: Bilateral TMs appear opaque, nonerythematous    Nose: Nose normal.     Comments: Erythematous swollen turbinates bilaterally    Mouth/Throat:     Comments: Oral mucosa pink and moist, no tonsillar enlargement or exudate. Posterior pharynx patent and nonerythematous, no uvula deviation or swelling. Normal phonation. Eyes:     Conjunctiva/sclera: Conjunctivae normal.  Cardiovascular:     Rate and Rhythm: Normal rate.  Pulmonary:     Effort: Pulmonary effort is normal. No respiratory distress.     Comments: Breathing comfortably at rest, CTABL, no wheezing, rales or other adventitious sounds auscultated Abdominal:     General: There is no distension.  Musculoskeletal:        General: Normal range of motion.     Cervical back: Neck supple.  Skin:    General: Skin is warm and dry.  Neurological:     Mental Status: She is alert and oriented to person, place, and time.      UC  Treatments / Results  Labs (all labs ordered are listed, but only abnormal results are displayed) Labs Reviewed - No data to display  EKG   Radiology No results found.  Procedures Procedures (including critical care time)  Medications Ordered in UC Medications  albuterol (VENTOLIN HFA) 108 (90 Base) MCG/ACT inhaler 2 puff (2 puffs Inhalation Given 06/02/20 1933)    Initial Impression / Assessment and Plan / UC Course  I have reviewed the triage vital signs and the nursing notes.  Pertinent labs & imaging results that were available during my care of the patient were reviewed by me and considered in my medical decision making (see chart  for details).     URI symptoms x2 weeks, COPD exacerbation, treating with doxycycline, course of prednisone, Tessalon and Flonase, refilled inhalers.  Rest and fluids.  Continue to monitor breathing and symptoms,Discussed strict return precautions. Patient verbalized understanding and is agreeable with plan.  Final Clinical Impressions(s) / UC Diagnoses   Final diagnoses:  Acute sinusitis with symptoms > 10 days  COPD exacerbation (HCC)     Discharge Instructions     Albuterol provided, Symbicort refilled Begin prednisone daily for 5 days with food Doxycycline twice daily for 10 days Tessalon/benzonatate every 8 hours for cough Flonase nasal spray 1-2 spray in each nostril daily  Please follow-up if any symptoms not improving or worsening   ED Prescriptions    Medication Sig Dispense Auth. Provider   budesonide-formoterol (SYMBICORT) 80-4.5 MCG/ACT inhaler Take 2 puffs first thing in am and then another 2 puffs about 12 hours later. 1 Inhaler [facility] C, PA-C   doxycycline (VIBRAMYCIN) 100 MG capsule  (Status: Discontinued) Take 1 capsule (100 mg total) by mouth 2 (two) times daily for 20 days. 20 capsule Verlon Carcione C, PA-C   predniSONE (DELTASONE) 50 MG tablet Take 1 tablet (50 mg total) by mouth daily for 5 days. 5 tablet Ezrie Bunyan C, PA-C   benzonatate (TESSALON) 200 MG capsule Take 1 capsule (200 mg total) by mouth 3 (three) times daily as needed for up to 7 days for cough. 28 capsule Vassie Kugel C, PA-C   fluticasone (FLONASE) 50 MCG/ACT nasal spray Place 1-2 sprays into both nostrils daily for 7 days. 1 g Kasin Tonkinson C, PA-C   doxycycline (VIBRAMYCIN) 100 MG capsule Take 1 capsule (100 mg total) by mouth 2 (two) times daily for 10 days. 20 capsule Una Yeomans, Bonadelle Ranchos C, PA-C     PDMP not reviewed this encounter.   Janith Lima, PA-C 06/03/20 1108

## 2020-06-04 DIAGNOSIS — B20 Human immunodeficiency virus [HIV] disease: Secondary | ICD-10-CM | POA: Diagnosis not present

## 2020-06-05 DIAGNOSIS — B20 Human immunodeficiency virus [HIV] disease: Secondary | ICD-10-CM | POA: Diagnosis not present

## 2020-06-06 DIAGNOSIS — B20 Human immunodeficiency virus [HIV] disease: Secondary | ICD-10-CM | POA: Diagnosis not present

## 2020-06-07 DIAGNOSIS — B20 Human immunodeficiency virus [HIV] disease: Secondary | ICD-10-CM | POA: Diagnosis not present

## 2020-06-08 DIAGNOSIS — B20 Human immunodeficiency virus [HIV] disease: Secondary | ICD-10-CM | POA: Diagnosis not present

## 2020-06-09 ENCOUNTER — Other Ambulatory Visit: Payer: Self-pay

## 2020-06-09 ENCOUNTER — Ambulatory Visit: Payer: Medicaid Other

## 2020-06-09 ENCOUNTER — Encounter: Payer: Self-pay | Admitting: Internal Medicine

## 2020-06-09 ENCOUNTER — Ambulatory Visit: Payer: Medicaid Other | Admitting: Internal Medicine

## 2020-06-09 ENCOUNTER — Ambulatory Visit (INDEPENDENT_AMBULATORY_CARE_PROVIDER_SITE_OTHER): Payer: Medicaid Other | Admitting: Internal Medicine

## 2020-06-09 DIAGNOSIS — B20 Human immunodeficiency virus [HIV] disease: Secondary | ICD-10-CM | POA: Diagnosis not present

## 2020-06-09 DIAGNOSIS — J310 Chronic rhinitis: Secondary | ICD-10-CM | POA: Diagnosis not present

## 2020-06-09 DIAGNOSIS — J449 Chronic obstructive pulmonary disease, unspecified: Secondary | ICD-10-CM | POA: Diagnosis not present

## 2020-06-09 MED ORDER — BREZTRI AEROSPHERE 160-9-4.8 MCG/ACT IN AERO: 2.0000 | INHALATION_SPRAY | Freq: Two times a day (BID) | RESPIRATORY_TRACT | 0 refills | Status: DC

## 2020-06-09 MED ORDER — BREZTRI AEROSPHERE 160-9-4.8 MCG/ACT IN AERO: 2.0000 | INHALATION_SPRAY | Freq: Two times a day (BID) | RESPIRATORY_TRACT | 5 refills | Status: DC

## 2020-06-09 NOTE — Progress Notes (Signed)
Janice Williams, female    DOB: 06/01/1961,    MRN: [id number]   Brief patient profile:  23 yobf  HIV 2008 Quit smoking in 2014 cough / sob rx saba then symbicort which helped but still coughing so referred to pulmonary clinic 07/08/2019 by [name]      History of Present Illness  07/08/2019  Pulmonary/ 1st office eval/Lleyton Byers  Chief Complaint  Patient presents with  . Pulmonary Consult    Referred by [name], NP for eval of COPD. Pt c/o cough for the past several months- occ prod with clear sputum. She uses her albuterol inhaler about 2 x per day.   Dyspnea:  MMRC3 = can't walk 100 yards even at a slow pace at a flat grade s stopping due to sob  mb to house and has to stop landing to catch here back slt uphill and 2 steps to top landing  Cough: starts a few hours p supper much worse lying > min clear mucus Sleep: p drinking water settles down able to sleep s noct or am flare  SABA use: helps  pred def helps Hb variable, uses ppi prn  rec Prednisone 10 mg take  4 each am x 2 days,   2 each am x 2 days,  1 each am x 2 days and stop  Plan A = Automatic = symbicort 80 Take 2 puffs first thing in am and then another 2 puffs about 12 hours later.  Work on inhaler technique:    Plan B = Backup Only use your albuterol inhaler as a rescue medication   08/11/2019  f/u ov/Jshon Ibe re: presumed copd / maint on symb 80 2bid though hfa poor Chief Complaint  Patient presents with  . Follow-up    Breathing is doing well and she has not used her rescue inhaler.   Dyspnea:  MMRC2 = can't walk a nl pace on a flat grade s sob but does fine slow and flat  Cough: none Sleeping: able to lie flat bed with 2 pillows  SABA use: very little  rec No change in medications Work on inhaler technique:  06/02/20 ER eval for aecopd > doxy / pred   06/09/2020  f/u post ER ov/Amijah Timothy re: copd presumed / flare with nasal congestion  on doxy no prednisone  Chief Complaint  Patient presents with  .  Follow-up   Dyspnea:  MMRC2 = can't walk a nl pace on a flat grade s sob but does fine slow and flat  Cough:  Rattling congested light green improving on doxy / did not take pred yet "too strong"  Sleeping: 30 degrees on flat bed with pillows SABA use: rarely using but when she does it's "after she overdoes it"  02: none   No obvious day to day or daytime variability or assoc excess/ purulent sputum or mucus plugs or hemoptysis or cp or chest tightness, subjective wheeze or overt sinus or hb symptoms.   Sleeping  without nocturnal  or early am exacerbation  of respiratory  c/o's or need for noct saba. Also denies any obvious fluctuation of symptoms with weather or environmental changes or other aggravating or alleviating factors except as outlined above   No unusual exposure hx or h/o childhood pna/ asthma or knowledge of premature birth.  Current Allergies, Complete Past Medical History, Past Surgical History, Family History, and Social History were reviewed in [employer] record.  ROS  The following are not active complaints unless  bolded Hoarseness, sore throat, dysphagia, dental problems, itching, sneezing,  nasal congestion or discharge of excess mucus or purulent secretions, ear ache,   fever, chills, sweats, unintended wt loss or wt gain, classically pleuritic or exertional cp,  orthopnea pnd or arm/hand swelling  or leg swelling, presyncope, palpitations, abdominal pain, anorexia, nausea, vomiting, diarrhea  or change in bowel habits or change in bladder habits, change in stools or change in urine, dysuria, hematuria,  rash, arthralgias, visual complaints, headache, numbness, weakness or ataxia or problems with walking or coordination,  change in mood or  memory.        Current Meds  Medication Sig  . albuterol (VENTOLIN HFA) 108 (90 Base) MCG/ACT inhaler Inhale 2 puffs into the lungs every 6 (six) hours as needed for wheezing or shortness of breath.  [name]  amLODipine (NORVASC) 10 MG tablet TAKE ONE TABLET BY MOUTH DAILY  . benzonatate (TESSALON) 200 MG capsule Take 1 capsule (200 mg total) by mouth 3 (three) times daily as needed for up to 7 days for cough.  [name] BIKTARVY 50-200-25 MG TABS tablet TAKE 1 TABLET BY MOUTH DAILY.  . budesonide-formoterol (SYMBICORT) 80-4.5 MCG/ACT inhaler  stopped 2 week prior to OV    . calcium citrate-vitamin D (CITRACAL+D) 315-200 MG-UNIT tablet Take 2 tablets by mouth 2 (two) times daily.  . diclofenac sodium (VOLTAREN) 1 % GEL Apply 2 g topically 4 (four) times daily as needed.  . doxycycline (VIBRAMYCIN) 100 MG capsule Take 1 capsule (100 mg total) by mouth 2 (two) times daily for 10 days.  . Ensure (ENSURE) Take 237 mLs by mouth 3 (three) times daily between meals.  . fluticasone (FLONASE) 50 MCG/ACT nasal spray Place 1-2 sprays into both nostrils daily for 7 days.  . hydrocortisone 2.5 % cream Apply rectally 2 times daily  . loratadine (CLARITIN) 10 MG tablet Take 1 tablet (10 mg total) by mouth daily.  . megestrol (MEGACE) 40 MG tablet Take 1 tablet (40 mg total) by mouth daily.  . montelukast (SINGULAIR) 10 MG tablet Take 1 tablet (10 mg total) by mouth at bedtime.  [name] omeprazole (PRILOSEC) 20 MG capsule TAKE 1 CAPSULE BY MOUTH EVERY DAY  . ondansetron (ZOFRAN) 4 MG tablet Take 1 tablet (4 mg total) by mouth every 8 (eight) hours as needed for nausea or vomiting.  . polyethylene glycol (MIRALAX / GLYCOLAX) packet Take one capful in 8 oz of fluid of your choice twice a day until having soft stools, then back down to once a day.  If not having soft stools with twice a day, increased to three times a day  . pramoxine (PROCTOFOAM) 1 % foam Place 1 application rectally 3 (three) times daily as needed.  . pravastatin (PRAVACHOL) 20 MG tablet TAKE 1 TABLET BY MOUTH EVERY DAY  . traMADol (ULTRAM) 50 MG tablet TAKE 1 TABLET(50 MG) BY MOUTH EVERY 12 HOURS AS NEEDED FOR PAIN  . Witch Hazel (TUCKS) 50 % PADS Use as needed for  rectal itching, after having BM  . [DISCONTINUED] albuterol (PROVENTIL HFA;VENTOLIN HFA) 108 (90 Base) MCG/ACT inhaler Inhale 2 puffs into the lungs every 6 (six) hours as needed for wheezing or shortness of breath.             Past Medical History:  Diagnosis Date  . Allergy   . Anemia   . Arthritis      lower back, knees  . Bronchitis   . COPD (chronic obstructive pulmonary disease) ([place])   .  Former smoker    quit 2014  . GERD (gastroesophageal reflux disease)   . HIV infection ([place])   . Hypertension   . Stroke ([place])   . 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 SVD (spontaneous vaginal delivery)    x 3     Objective:     06/09/2020        97  08/11/19 100 lb 3.2 oz (45.5 kg)  07/08/19 102 lb (46.3 kg)  12/02/18 99 lb (44.9 kg)     Vital signs reviewed  06/09/2020  - Note at rest 02 sats  100% on  RA        HEENT : pt wearing mask not removed for exam due to covid - 19 concerns.    NECK :  without JVD/Nodes/TM/ nl carotid upstrokes bilaterally   LUNGS: no acc muscle use,  Mild barrel  contour chest wall with bilateral  Distant bs s audible wheeze and  without cough on insp or exp maneuvers  and mild  Hyperresonant  to  percussion bilaterally     CV:  RRR  no s3 or murmur or increase in P2, and no edema   ABD:  soft and nontender with pos end  insp Hoover's  in the supine position. No bruits or organomegaly appreciated, bowel sounds nl  MS:   Nl gait/  ext warm without deformities, calf tenderness, cyanosis or clubbing No obvious joint restrictions   SKIN: warm and dry without lesions    NEURO:  alert, approp, nl sensorium with  no motor or cerebellar deficits apparent.             CXR PA and Lateral:   06/09/2020 :    I personally reviewed images and agree with radiology impression as follows:   req/ not done Assessment

## 2020-06-09 NOTE — Progress Notes (Deleted)
Janice Williams, female    DOB: 1961/11/26,    MRN: [id number]   Brief patient profile:  28 yobf  HIV 2008 Quit smoking in 2014 cough / sob rx saba then symbicort which helped but still coughing so referred to pulmonary clinic 07/08/2019 by [name]      History of Present Illness  07/08/2019  Pulmonary/ 1st office eval/Mingo Siegert  Chief Complaint  Patient presents with  . Pulmonary Consult    Referred by [name], NP for eval of COPD. Pt c/o cough for the past several months- occ prod with clear sputum. She uses her albuterol inhaler about 2 x per day.   Dyspnea:  MMRC3 = can't walk 100 yards even at a slow pace at a flat grade s stopping due to sob  mb to house and has to stop landing to catch here back slt uphill and 2 steps to top landing  Cough: starts a few hours p supper much worse lying > min clear mucus Sleep: p drinking water settles down able to sleep s noct or am flare  SABA use: helps  pred def helps Hb variable, uses ppi prn  rec Prednisone 10 mg take  4 each am x 2 days,   2 each am x 2 days,  1 each am x 2 days and stop  Plan A = Automatic = symbicort 80 Take 2 puffs first thing in am and then another 2 puffs about 12 hours later.  Work on inhaler technique:    Plan B = Backup Only use your albuterol inhaler as a rescue medication   08/11/2019  f/u ov/Raja Liska re: presumed copd / maint on symb 80 2bid though hfa poor Chief Complaint  Patient presents with  . Follow-up    Breathing is doing well and she has not used her rescue inhaler.   Dyspnea:  MMRC2 = can't walk a nl pace on a flat grade s sob but does fine slow and flat  Cough: none Sleeping: able to lie flat bed with 2 pillows  SABA use: very little  rec No change in medications Work on inhaler technique Please schedule a follow up visit in 3 months but call sooner if needed in 3 months with pfts    06/09/2020  f/u ov/Andriea Hasegawa re:  No chief complaint on file.    Dyspnea:  *** Cough: *** Sleeping:  *** SABA use: *** 02: ***   No obvious day to day or daytime variability or assoc excess/ purulent sputum or mucus plugs or hemoptysis or cp or chest tightness, subjective wheeze or overt sinus or hb symptoms.   *** without nocturnal  or early am exacerbation  of respiratory  c/o's or need for noct saba. Also denies any obvious fluctuation of symptoms with weather or environmental changes or other aggravating or alleviating factors except as outlined above   No unusual exposure hx or h/o childhood pna/ asthma or knowledge of premature birth.  Current Allergies, Complete Past Medical History, Past Surgical History, Family History, and Social History were reviewed in [employer] record.  ROS  The following are not active complaints unless bolded Hoarseness, sore throat, dysphagia, dental problems, itching, sneezing,  nasal congestion or discharge of excess mucus or purulent secretions, ear ache,   fever, chills, sweats, unintended wt loss or wt gain, classically pleuritic or exertional cp,  orthopnea pnd or arm/hand swelling  or leg swelling, presyncope, palpitations, abdominal pain, anorexia, nausea, vomiting, diarrhea  or change in  bowel habits or change in bladder habits, change in stools or change in urine, dysuria, hematuria,  rash, arthralgias, visual complaints, headache, numbness, weakness or ataxia or problems with walking or coordination,  change in mood or  memory.        No outpatient medications have been marked as taking for the 06/09/20 encounter (Appointment) with Tanda Rockers, MD.                  Past Medical History:  Diagnosis Date  . Allergy   . Anemia   . Arthritis    lower back, knees  . Bronchitis   . COPD (chronic obstructive pulmonary disease) ([place])   . Former smoker    quit 2014  . GERD (gastroesophageal reflux disease)   . HIV infection ([place])   . Hypertension   . Stroke ([place])   . Substance abuse ([place])    history, clean 7  years  . SVD (spontaneous vaginal delivery)    x 3       Objective:     06/09/2020        ***  08/11/19 100 lb 3.2 oz (45.5 kg)  07/08/19 102 lb (46.3 kg)  12/02/18 99 lb (44.9 kg)     Vital signs reviewed  06/09/2020  - Note at rest 02 sats  ***% on ***    LUNGS: no acc muscle use,  Mild barrel  contour chest wal         Assessment

## 2020-06-09 NOTE — Patient Instructions (Addendum)
Plan A = Automatic = Always=    Breztri or symbicort 160 Take 2 puffs first thing in am and then another 2 puffs about 12 hours later.    Work on inhaler technique:  relax and gently blow all the way out then take a nice smooth deep breath back in, triggering the inhaler at same time you start breathing in.  Hold for up to 5 seconds if you can. Blow out thru nose. Rinse and gargle with water when done  Prednisone one half daily until gone  with bfast and finish the doxy     Plan B = Backup (to supplement plan A, not to replace it) Only use your albuterol inhaler as a rescue medication to be used if you can't catch your breath by resting or doing a relaxed purse lip breathing pattern.  - The less you use it, the better it will work when you need it. - Ok to use the inhaler up to 2 puffs  every 4 hours if you must but call for appointment if use goes up over your usual need - Don't leave home without it !!  (think of it like the spare tire for your car)   Please remember to go to the  x-ray department  for your tests - we will call you with the results when they are available   clariton d is as needed when nose is clogged   Please schedule a follow up visit in 3 months but call sooner if needed with pfts

## 2020-06-10 ENCOUNTER — Encounter: Payer: Self-pay | Admitting: Internal Medicine

## 2020-06-10 DIAGNOSIS — B20 Human immunodeficiency virus [HIV] disease: Secondary | ICD-10-CM | POA: Diagnosis not present

## 2020-06-10 DIAGNOSIS — J310 Chronic rhinitis: Secondary | ICD-10-CM | POA: Insufficient documentation

## 2020-06-10 NOTE — Assessment & Plan Note (Signed)
Quit smoking 2014  -08/11/2019  After extensive coaching inhaler device,  effectiveness =    50% from baseline 25% (short Ti) > continue symb 80 2bid  - 06/09/2020  After extensive coaching inhaler device,  effectiveness =    75% > try breztri 2bid if insurance covers  Clinically  Group D in terms of symptom/risk and laba/lama/ICS  therefore appropriate rx at this point >>>  breztri and approp saba  >> complete doxy/ pred as rec by er  I spent extra time with pt today reviewing appropriate use of albuterol for prn use on exertion with the following points: 1) saba is for relief of sob that does not improve by walking a slower pace or resting but rather if the pt does not improve after trying this first. 2) If the pt is convinced, as many are, that saba helps recover from activity faster then it's easy to tell if this is the case by re-challenging : ie stop, take the inhaler, then p 5 minutes try the exact same activity (intensity of workload) that just caused the symptoms and see if they are substantially diminished or not after saba 3) if there is an activity that reproducibly causes the symptoms, try the saba 15 min before the activity on alternate days   If in fact the saba really does help, then fine to continue to use it prn but advised may need to look closer at the maintenance regimen being used to achieve better control of airways disease with exertion.   Advised:  formulary restrictions will be an ongoing challenge for the forseable future and I would be happy to pick an alternative if the pt will first  provide me a list of them -  pt  will need to return here for training for any new device that is required eg dpi vs hfa vs respimat.    In the meantime we can always provide samples so that the patient never runs out of any needed respiratory medications.

## 2020-06-10 NOTE — Assessment & Plan Note (Signed)
Acute flare 05/2020 > rx doxy/ pred / clariton D  F/u ENT prn          Each maintenance medication was reviewed in detail including emphasizing most importantly the difference between maintenance and prns and under what circumstances the prns are to be triggered using an action plan format where appropriate.  Total time for H and P, chart review, counseling, teaching device and generating customized AVS unique to this office visit / charting = 20 min

## 2020-06-11 ENCOUNTER — Telehealth: Payer: Self-pay | Admitting: *Deleted

## 2020-06-11 DIAGNOSIS — B20 Human immunodeficiency virus [HIV] disease: Secondary | ICD-10-CM | POA: Diagnosis not present

## 2020-06-11 NOTE — Telephone Encounter (Signed)
Tanda Rockers, MD sent to Janice Williams, CMA Did not go to cxr as req > tell her can't complete the w/u until she has it but if declines just document    Spoke with the pt  She states has issues with transportation and is unsure if she would be able to come back for the cxr sooner than her already scheduled appt but she will try her best.

## 2020-06-12 DIAGNOSIS — B20 Human immunodeficiency virus [HIV] disease: Secondary | ICD-10-CM | POA: Diagnosis not present

## 2020-06-13 DIAGNOSIS — B20 Human immunodeficiency virus [HIV] disease: Secondary | ICD-10-CM | POA: Diagnosis not present

## 2020-06-14 ENCOUNTER — Telehealth: Payer: Self-pay | Admitting: Internal Medicine

## 2020-06-14 DIAGNOSIS — B20 Human immunodeficiency virus [HIV] disease: Secondary | ICD-10-CM | POA: Diagnosis not present

## 2020-06-14 NOTE — Telephone Encounter (Signed)
[name], patient is reporting that Janice Williams does not work as well as Symbicort did. She is requesting to switch back. Please advise.

## 2020-06-14 NOTE — Telephone Encounter (Signed)
breztri has symbicort in it plus another ingredient to help breathing so fine to go back to symbicort until she returns and regroup then

## 2020-06-14 NOTE — Telephone Encounter (Signed)
Called and spoke with pt letting her know the info stated by MW about the ingredients in [place] but he said if she wanted to go back to symbicort that was fine. Pt stated that she will continue on Breztri. Nothing further needed.

## 2020-06-15 DIAGNOSIS — B20 Human immunodeficiency virus [HIV] disease: Secondary | ICD-10-CM | POA: Diagnosis not present

## 2020-06-16 DIAGNOSIS — B20 Human immunodeficiency virus [HIV] disease: Secondary | ICD-10-CM | POA: Diagnosis not present

## 2020-06-17 DIAGNOSIS — R69 Illness, unspecified: Secondary | ICD-10-CM | POA: Diagnosis not present

## 2020-06-18 DIAGNOSIS — R69 Illness, unspecified: Secondary | ICD-10-CM | POA: Diagnosis not present

## 2020-06-19 DIAGNOSIS — R69 Illness, unspecified: Secondary | ICD-10-CM | POA: Diagnosis not present

## 2020-06-20 DIAGNOSIS — R69 Illness, unspecified: Secondary | ICD-10-CM | POA: Diagnosis not present

## 2020-06-21 DIAGNOSIS — R69 Illness, unspecified: Secondary | ICD-10-CM | POA: Diagnosis not present

## 2020-06-22 DIAGNOSIS — R69 Illness, unspecified: Secondary | ICD-10-CM | POA: Diagnosis not present

## 2020-06-23 DIAGNOSIS — R69 Illness, unspecified: Secondary | ICD-10-CM | POA: Diagnosis not present

## 2020-06-24 DIAGNOSIS — R69 Illness, unspecified: Secondary | ICD-10-CM | POA: Diagnosis not present

## 2020-06-25 DIAGNOSIS — R69 Illness, unspecified: Secondary | ICD-10-CM | POA: Diagnosis not present

## 2020-06-26 DIAGNOSIS — R69 Illness, unspecified: Secondary | ICD-10-CM | POA: Diagnosis not present

## 2020-06-27 DIAGNOSIS — R69 Illness, unspecified: Secondary | ICD-10-CM | POA: Diagnosis not present

## 2020-06-28 ENCOUNTER — Other Ambulatory Visit: Payer: Self-pay | Admitting: Internal Medicine

## 2020-06-28 ENCOUNTER — Other Ambulatory Visit: Payer: Self-pay

## 2020-06-28 DIAGNOSIS — R634 Abnormal weight loss: Secondary | ICD-10-CM

## 2020-06-28 DIAGNOSIS — M1612 Unilateral primary osteoarthritis, left hip: Secondary | ICD-10-CM

## 2020-06-28 DIAGNOSIS — R69 Illness, unspecified: Secondary | ICD-10-CM | POA: Diagnosis not present

## 2020-06-28 MED ORDER — ENSURE PO LIQD: 237.0000 mL | Freq: Three times a day (TID) | ORAL | 5 refills | Status: DC

## 2020-06-28 NOTE — Progress Notes (Signed)
Patient called office today requesting refills on Ensure. States [facility] does not have any refills on file for her. Will print script for MD to sign. [place]

## 2020-06-29 DIAGNOSIS — R69 Illness, unspecified: Secondary | ICD-10-CM | POA: Diagnosis not present

## 2020-06-30 ENCOUNTER — Other Ambulatory Visit: Payer: Self-pay | Admitting: Internal Medicine

## 2020-06-30 ENCOUNTER — Other Ambulatory Visit: Payer: Self-pay

## 2020-06-30 ENCOUNTER — Other Ambulatory Visit (HOSPITAL_COMMUNITY): Payer: Self-pay | Admitting: Internal Medicine

## 2020-06-30 DIAGNOSIS — R69 Illness, unspecified: Secondary | ICD-10-CM | POA: Diagnosis not present

## 2020-06-30 DIAGNOSIS — B20 Human immunodeficiency virus [HIV] disease: Secondary | ICD-10-CM

## 2020-06-30 NOTE — Telephone Encounter (Signed)
Tramadol RX was sent to pharmacy via [facility] yesterday, 06/29/20. SChaplin, RN,BSN

## 2020-06-30 NOTE — Telephone Encounter (Signed)
traMADol (ULTRAM) 50 MG tablet, REFILL REQUEST @  [facility], [address] AT [address] Phone:  [phone]  Fax:  [phone]

## 2020-07-01 DIAGNOSIS — R69 Illness, unspecified: Secondary | ICD-10-CM | POA: Diagnosis not present

## 2020-07-02 DIAGNOSIS — R69 Illness, unspecified: Secondary | ICD-10-CM | POA: Diagnosis not present

## 2020-07-03 DIAGNOSIS — R69 Illness, unspecified: Secondary | ICD-10-CM | POA: Diagnosis not present

## 2020-07-04 DIAGNOSIS — R69 Illness, unspecified: Secondary | ICD-10-CM | POA: Diagnosis not present

## 2020-07-05 DIAGNOSIS — R69 Illness, unspecified: Secondary | ICD-10-CM | POA: Diagnosis not present

## 2020-07-06 DIAGNOSIS — R69 Illness, unspecified: Secondary | ICD-10-CM | POA: Diagnosis not present

## 2020-07-07 DIAGNOSIS — H6983 Other specified disorders of Eustachian tube, bilateral: Secondary | ICD-10-CM | POA: Diagnosis not present

## 2020-07-07 DIAGNOSIS — R69 Illness, unspecified: Secondary | ICD-10-CM | POA: Diagnosis not present

## 2020-07-07 DIAGNOSIS — L299 Pruritus, unspecified: Secondary | ICD-10-CM | POA: Diagnosis not present

## 2020-07-07 DIAGNOSIS — H9313 Tinnitus, bilateral: Secondary | ICD-10-CM | POA: Diagnosis not present

## 2020-07-07 DIAGNOSIS — J3489 Other specified disorders of nose and nasal sinuses: Secondary | ICD-10-CM | POA: Diagnosis not present

## 2020-07-07 MED FILL — BIKTARVY 50-200-25 MG TABS: 50-200-25 | 30 days supply | Qty: 30 | Fill #0

## 2020-07-08 DIAGNOSIS — R69 Illness, unspecified: Secondary | ICD-10-CM | POA: Diagnosis not present

## 2020-07-09 DIAGNOSIS — R69 Illness, unspecified: Secondary | ICD-10-CM | POA: Diagnosis not present

## 2020-07-10 DIAGNOSIS — R69 Illness, unspecified: Secondary | ICD-10-CM | POA: Diagnosis not present

## 2020-07-11 DIAGNOSIS — R69 Illness, unspecified: Secondary | ICD-10-CM | POA: Diagnosis not present

## 2020-07-12 DIAGNOSIS — R69 Illness, unspecified: Secondary | ICD-10-CM | POA: Diagnosis not present

## 2020-07-13 DIAGNOSIS — R69 Illness, unspecified: Secondary | ICD-10-CM | POA: Diagnosis not present

## 2020-07-14 DIAGNOSIS — R69 Illness, unspecified: Secondary | ICD-10-CM | POA: Diagnosis not present

## 2020-07-15 DIAGNOSIS — R69 Illness, unspecified: Secondary | ICD-10-CM | POA: Diagnosis not present

## 2020-07-16 DIAGNOSIS — R69 Illness, unspecified: Secondary | ICD-10-CM | POA: Diagnosis not present

## 2020-07-17 DIAGNOSIS — R69 Illness, unspecified: Secondary | ICD-10-CM | POA: Diagnosis not present

## 2020-07-18 DIAGNOSIS — R69 Illness, unspecified: Secondary | ICD-10-CM | POA: Diagnosis not present

## 2020-07-19 ENCOUNTER — Ambulatory Visit (INDEPENDENT_AMBULATORY_CARE_PROVIDER_SITE_OTHER): Payer: Medicaid Other | Admitting: Internal Medicine

## 2020-07-19 ENCOUNTER — Encounter: Payer: Self-pay | Admitting: Internal Medicine

## 2020-07-19 VITALS — BP 119/82 | HR 78 | Temp 98.0°F | Ht 63.0 in | Wt 97.1 lb

## 2020-07-19 DIAGNOSIS — J310 Chronic rhinitis: Secondary | ICD-10-CM | POA: Diagnosis not present

## 2020-07-19 DIAGNOSIS — R638 Other symptoms and signs concerning food and fluid intake: Secondary | ICD-10-CM | POA: Diagnosis present

## 2020-07-19 DIAGNOSIS — I10 Essential (primary) hypertension: Secondary | ICD-10-CM | POA: Diagnosis not present

## 2020-07-19 DIAGNOSIS — J449 Chronic obstructive pulmonary disease, unspecified: Secondary | ICD-10-CM

## 2020-07-19 DIAGNOSIS — T7840XD Allergy, unspecified, subsequent encounter: Secondary | ICD-10-CM

## 2020-07-19 DIAGNOSIS — B20 Human immunodeficiency virus [HIV] disease: Secondary | ICD-10-CM

## 2020-07-19 DIAGNOSIS — R69 Illness, unspecified: Secondary | ICD-10-CM | POA: Diagnosis not present

## 2020-07-19 MED ORDER — LORATADINE 10 MG PO TABS: 10.0000 mg | ORAL_TABLET | Freq: Every day | ORAL | 3 refills | Status: DC

## 2020-07-19 NOTE — Patient Instructions (Signed)
Thank you for trusting me with your care. To recap, today we discussed the following:  Blood pressure - continue amlodipine, your BP is well controlled   Unable to gain weight   Plan: Referral to Nutrition and Diabetes Services  Allergy, subsequent encounter - Plan: loratadine (CLARITIN) 10 MG tablet, follow up with specialist.  COPD - Continue taking Symbicort and as need albuterol - Discuss with [name] at your next visit  I filled out your parking form.  My best,  Tamsen Snider, MD

## 2020-07-19 NOTE — Progress Notes (Signed)
   CC: allergies, hx of high blood pressure, and COPD  HPI:Janice Williams is a 59 y.o. female who presents for evaluation of allergies, COPD, and hypertension . Please see individual problem based A/P for details.  . Past Medical History:  Diagnosis Date  . Allergy   . Anemia   . Arthritis    lower back, knees  . Bronchitis   . COPD (chronic obstructive pulmonary disease) ([place])   . COPD with acute exacerbation ([place]) 06/09/2017  . Dental abscess 03/15/2015  . DYSPEPSIA 02/14/2007   Qualifier: Diagnosis of  By: Samara Snide    . EXTERNAL OTITIS 01/06/2010   Qualifier: Diagnosis of  By: Tomma Lightning MD, Claiborne Billings    . FACIAL RASH 02/04/2009   Qualifier: Diagnosis of  By: Rhunette Croft    . Former smoker    quit 2014  . GERD (gastroesophageal reflux disease)   . HIV infection ([place])   . Hypertension   . Stroke ([place])   . 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 SVD (spontaneous vaginal delivery)    x 3   Review of Systems:   ROS negative except as mentioned in individual problem based A/P.   Physical Exam: Vitals:   07/19/20 0928  BP: 119/82  Pulse: 78  Temp: 98 F (36.7 C)  TempSrc: Oral  SpO2: 100%  Weight: 97 lb 1.6 oz (44 kg)  Height: 5\' 3"  (1.6 m)    General: NAD, nl appearance HE: Normocephalic, atraumatic , EOMI, Conjunctivae normal ENT: No congestion, no rhinorrhea, no exudate or erythema  Cardiovascular: Normal rate, regular rhythm.  No murmurs, rubs, or gallops Pulmonary : Effort normal, breath sounds normal. No wheezes, rales, or rhonchi Musculoskeletal: no swelling , deformity, injury ,or tenderness in extremities, Psychiatric/Behavioral:  normal mood, normal behavior   Assessment & Plan:   See Encounters Tab for problem based charting.  Patient discussed with Dr. Daryll Drown

## 2020-07-20 ENCOUNTER — Encounter: Payer: Self-pay | Admitting: Internal Medicine

## 2020-07-20 DIAGNOSIS — R69 Illness, unspecified: Secondary | ICD-10-CM | POA: Diagnosis not present

## 2020-07-20 NOTE — Assessment & Plan Note (Signed)
Chronic rhinitis:  - Refilled patient Claritin for chronic rhinitis. Reports she has been referred to ENT doctor for evaluation

## 2020-07-20 NOTE — Assessment & Plan Note (Signed)
COPD: Recently started on Breztri, but reports it does not work. She would like to go back to using Symbicort. On review of chart her pulmonologist was aware. Patient is aware [name] includes the ingredients of Symbicort.  Plan: Follow up with pulmonologist as scheduled. Continue Symbicort Continue Albuterol nebulizer

## 2020-07-20 NOTE — Assessment & Plan Note (Signed)
Patient reports she has been unable to put on any weight since contracting HIV. Her ID doctor started her on megestrol, but she hasn't seen a benefit to date.  Wt Readings from Last 3 Encounters:  07/19/20 97 lb 1.6 oz (44 kg)  06/09/20 97 lb 9.6 oz (44.3 kg)  04/21/20 98 lb (44.5 kg)   Plan: Referral to [facility] for nutrition counseling

## 2020-07-20 NOTE — Assessment & Plan Note (Signed)
Hypertension: Patient's BP today is  119 /82 with a goal of <140/80. The patient endorses adherence to her medication regimen. She denied, chest pain, headache, visual changes, lightheadedness, weakness, dizziness on standing, swelling in the feet or ankles. Most recent renal function per BMP as below. BMP Latest Ref Rng & Units 12/22/2019 08/27/2019 03/06/2019  Glucose 65 - 99 mg/dL 130(H) 85 99  BUN 7 - 25 mg/dL 13 19 17   Creatinine 0.50 - 1.05 mg/dL 1.02 1.03 1.04  BUN/Creat Ratio 6 - 22 (calc) NOT APPLICABLE NOT APPLICABLE NOT APPLICABLE  Sodium 161 - 146 mmol/L 141 140 146  Potassium 3.5 - 5.3 mmol/L 4.4 4.4 5.1  Chloride 98 - 110 mmol/L 105 102 104  CO2 20 - 32 mmol/L 29 26 32  Calcium 8.6 - 10.4 mg/dL 9.7 10.1 10.0   Medication changes: No  Plan: Continue Amlodipine 10 mg daily

## 2020-07-20 NOTE — Progress Notes (Signed)
Internal Medicine Clinic Attending  Case discussed with Dr. Steen  At the time of the visit.  We reviewed the resident's history and exam and pertinent patient test results.  I agree with the assessment, diagnosis, and plan of care documented in the resident's note.  

## 2020-07-21 DIAGNOSIS — R69 Illness, unspecified: Secondary | ICD-10-CM | POA: Diagnosis not present

## 2020-07-22 DIAGNOSIS — R69 Illness, unspecified: Secondary | ICD-10-CM | POA: Diagnosis not present

## 2020-07-23 DIAGNOSIS — R69 Illness, unspecified: Secondary | ICD-10-CM | POA: Diagnosis not present

## 2020-07-24 DIAGNOSIS — R69 Illness, unspecified: Secondary | ICD-10-CM | POA: Diagnosis not present

## 2020-07-25 DIAGNOSIS — R69 Illness, unspecified: Secondary | ICD-10-CM | POA: Diagnosis not present

## 2020-07-26 DIAGNOSIS — R69 Illness, unspecified: Secondary | ICD-10-CM | POA: Diagnosis not present

## 2020-07-27 DIAGNOSIS — R69 Illness, unspecified: Secondary | ICD-10-CM | POA: Diagnosis not present

## 2020-07-28 DIAGNOSIS — R69 Illness, unspecified: Secondary | ICD-10-CM | POA: Diagnosis not present

## 2020-07-29 ENCOUNTER — Telehealth: Payer: Self-pay | Admitting: Dietician

## 2020-07-29 ENCOUNTER — Other Ambulatory Visit: Payer: Self-pay | Admitting: Internal Medicine

## 2020-07-29 DIAGNOSIS — M1612 Unilateral primary osteoarthritis, left hip: Secondary | ICD-10-CM

## 2020-07-29 DIAGNOSIS — R69 Illness, unspecified: Secondary | ICD-10-CM | POA: Diagnosis not present

## 2020-07-29 NOTE — Telephone Encounter (Signed)
I spoke with Janice Williams and explained that her insurance does not cover nutritional counseling for weight gain. She verbalized understanding. She said she is hoping the megace that she started 2 weeks ago along with her Ensure that she gets at no charge and drinks three times a day will help. She reports that her usual weight is 110-115#. I encouraged her to call for nay questions or concerns.

## 2020-07-30 ENCOUNTER — Telehealth: Payer: Self-pay | Admitting: Internal Medicine

## 2020-07-30 DIAGNOSIS — J4530 Mild persistent asthma, uncomplicated: Secondary | ICD-10-CM | POA: Diagnosis not present

## 2020-07-30 DIAGNOSIS — J301 Allergic rhinitis due to pollen: Secondary | ICD-10-CM | POA: Diagnosis not present

## 2020-07-30 DIAGNOSIS — J3089 Other allergic rhinitis: Secondary | ICD-10-CM | POA: Diagnosis not present

## 2020-07-30 DIAGNOSIS — J309 Allergic rhinitis, unspecified: Secondary | ICD-10-CM | POA: Diagnosis not present

## 2020-07-30 DIAGNOSIS — R69 Illness, unspecified: Secondary | ICD-10-CM | POA: Diagnosis not present

## 2020-07-30 DIAGNOSIS — H1045 Other chronic allergic conjunctivitis: Secondary | ICD-10-CM | POA: Diagnosis not present

## 2020-07-30 NOTE — Telephone Encounter (Signed)
RTC, VM obtained and message left nurse returning call. SChaplin, RN,BSN

## 2020-07-30 NOTE — Telephone Encounter (Signed)
Pls contact pt [phone]

## 2020-07-30 NOTE — Telephone Encounter (Signed)
Patient called back. Requesting refill on tramadol. Informed her #60 with 2 refills was sent 06/29/2020. She will contact pharmacy now. Hubbard Hartshorn, BSN, RN-BC

## 2020-07-31 DIAGNOSIS — R69 Illness, unspecified: Secondary | ICD-10-CM | POA: Diagnosis not present

## 2020-08-01 ENCOUNTER — Other Ambulatory Visit: Payer: Self-pay | Admitting: Internal Medicine

## 2020-08-01 DIAGNOSIS — K2100 Gastro-esophageal reflux disease with esophagitis, without bleeding: Secondary | ICD-10-CM

## 2020-08-01 DIAGNOSIS — E785 Hyperlipidemia, unspecified: Secondary | ICD-10-CM

## 2020-08-01 DIAGNOSIS — R69 Illness, unspecified: Secondary | ICD-10-CM | POA: Diagnosis not present

## 2020-08-02 DIAGNOSIS — R69 Illness, unspecified: Secondary | ICD-10-CM | POA: Diagnosis not present

## 2020-08-03 DIAGNOSIS — R69 Illness, unspecified: Secondary | ICD-10-CM | POA: Diagnosis not present

## 2020-08-04 DIAGNOSIS — R69 Illness, unspecified: Secondary | ICD-10-CM | POA: Diagnosis not present

## 2020-08-05 DIAGNOSIS — R69 Illness, unspecified: Secondary | ICD-10-CM | POA: Diagnosis not present

## 2020-08-06 DIAGNOSIS — J301 Allergic rhinitis due to pollen: Secondary | ICD-10-CM | POA: Diagnosis not present

## 2020-08-06 DIAGNOSIS — R69 Illness, unspecified: Secondary | ICD-10-CM | POA: Diagnosis not present

## 2020-08-07 DIAGNOSIS — R69 Illness, unspecified: Secondary | ICD-10-CM | POA: Diagnosis not present

## 2020-08-08 DIAGNOSIS — R69 Illness, unspecified: Secondary | ICD-10-CM | POA: Diagnosis not present

## 2020-08-09 DIAGNOSIS — R69 Illness, unspecified: Secondary | ICD-10-CM | POA: Diagnosis not present

## 2020-08-09 DIAGNOSIS — J3089 Other allergic rhinitis: Secondary | ICD-10-CM | POA: Diagnosis not present

## 2020-08-10 DIAGNOSIS — R69 Illness, unspecified: Secondary | ICD-10-CM | POA: Diagnosis not present

## 2020-08-10 MED FILL — BIKTARVY 50-200-25 MG TABS: 50-200-25 | 30 days supply | Qty: 30 | Fill #1

## 2020-08-11 ENCOUNTER — Other Ambulatory Visit: Payer: Self-pay | Admitting: Internal Medicine

## 2020-08-11 DIAGNOSIS — L720 Epidermal cyst: Secondary | ICD-10-CM | POA: Diagnosis not present

## 2020-08-11 DIAGNOSIS — I10 Essential (primary) hypertension: Secondary | ICD-10-CM

## 2020-08-11 DIAGNOSIS — R69 Illness, unspecified: Secondary | ICD-10-CM | POA: Diagnosis not present

## 2020-08-11 MED ORDER — AMLODIPINE BESYLATE 10 MG PO TABS: 10.0000 mg | ORAL_TABLET | Freq: Every day | ORAL | 1 refills | Status: DC

## 2020-08-11 NOTE — Telephone Encounter (Signed)
Refill  Request  amLODipine (NORVASC) 10 MG tablet  Pt requesting medication to be sent to  [facility], [address] AT [address]

## 2020-08-12 ENCOUNTER — Telehealth: Payer: Self-pay

## 2020-08-12 ENCOUNTER — Other Ambulatory Visit: Payer: Self-pay | Admitting: Internal Medicine

## 2020-08-12 ENCOUNTER — Other Ambulatory Visit: Payer: Self-pay

## 2020-08-12 DIAGNOSIS — K649 Unspecified hemorrhoids: Secondary | ICD-10-CM

## 2020-08-12 MED ORDER — PRAMOXINE HCL (PERIANAL) 1 % EX FOAM: 1.0000 "application " | Freq: Three times a day (TID) | CUTANEOUS | 1 refills | Status: DC | PRN

## 2020-08-12 MED ORDER — MEGESTROL ACETATE 40 MG PO TABS: 40.0000 mg | ORAL_TABLET | Freq: Every day | ORAL | 1 refills | Status: DC

## 2020-08-12 NOTE — Telephone Encounter (Signed)
Refill Request  pramoxine (PROCTOFOAM) 1 % foam [facility], [address]

## 2020-08-12 NOTE — Telephone Encounter (Signed)
Patient called to request  pramoxine (PROCTOFOAM) 1 % foam [[id number]]  Dose: 1 application  Route: Rectal  Frequency: 3 times daily PRN for itching Dispense Quantity: 15 g Refills: 3     Sig: Place 1 application rectally 3 (three) times daily as needed for itching.  This medication has expired Routing to provider if ok to renew. Eugenia Mcalpine

## 2020-08-16 DIAGNOSIS — R69 Illness, unspecified: Secondary | ICD-10-CM | POA: Diagnosis not present

## 2020-08-16 NOTE — Telephone Encounter (Signed)
Patient called office today to follow up on prescription for Proctofoam. States that OTC medication does not help her. Requesting prescription go to [facility] on [address]. Would like call once refilled. [place]

## 2020-08-17 ENCOUNTER — Other Ambulatory Visit: Payer: Self-pay | Admitting: Internal Medicine

## 2020-08-17 DIAGNOSIS — R69 Illness, unspecified: Secondary | ICD-10-CM | POA: Diagnosis not present

## 2020-08-17 NOTE — Telephone Encounter (Signed)
Need refill on pramoxine (PROCTOFOAM) 1 % foam ;pt contact [place], [place]

## 2020-08-18 DIAGNOSIS — J3089 Other allergic rhinitis: Secondary | ICD-10-CM | POA: Diagnosis not present

## 2020-08-18 DIAGNOSIS — R69 Illness, unspecified: Secondary | ICD-10-CM | POA: Diagnosis not present

## 2020-08-18 DIAGNOSIS — J301 Allergic rhinitis due to pollen: Secondary | ICD-10-CM | POA: Diagnosis not present

## 2020-08-19 DIAGNOSIS — R69 Illness, unspecified: Secondary | ICD-10-CM | POA: Diagnosis not present

## 2020-08-20 DIAGNOSIS — R69 Illness, unspecified: Secondary | ICD-10-CM | POA: Diagnosis not present

## 2020-08-20 DIAGNOSIS — J3089 Other allergic rhinitis: Secondary | ICD-10-CM | POA: Diagnosis not present

## 2020-08-20 DIAGNOSIS — J301 Allergic rhinitis due to pollen: Secondary | ICD-10-CM | POA: Diagnosis not present

## 2020-08-21 DIAGNOSIS — R69 Illness, unspecified: Secondary | ICD-10-CM | POA: Diagnosis not present

## 2020-08-22 DIAGNOSIS — R69 Illness, unspecified: Secondary | ICD-10-CM | POA: Diagnosis not present

## 2020-08-23 DIAGNOSIS — R69 Illness, unspecified: Secondary | ICD-10-CM | POA: Diagnosis not present

## 2020-08-24 DIAGNOSIS — R69 Illness, unspecified: Secondary | ICD-10-CM | POA: Diagnosis not present

## 2020-08-25 ENCOUNTER — Telehealth: Payer: Self-pay | Admitting: Internal Medicine

## 2020-08-25 DIAGNOSIS — J301 Allergic rhinitis due to pollen: Secondary | ICD-10-CM | POA: Diagnosis not present

## 2020-08-25 DIAGNOSIS — R69 Illness, unspecified: Secondary | ICD-10-CM | POA: Diagnosis not present

## 2020-08-25 DIAGNOSIS — J3089 Other allergic rhinitis: Secondary | ICD-10-CM | POA: Diagnosis not present

## 2020-08-25 MED ORDER — BUDESONIDE-FORMOTEROL FUMARATE 80-4.5 MCG/ACT IN AERO: INHALATION_SPRAY | RESPIRATORY_TRACT | 1 refills | Status: DC

## 2020-08-26 DIAGNOSIS — R69 Illness, unspecified: Secondary | ICD-10-CM | POA: Diagnosis not present

## 2020-08-26 NOTE — Telephone Encounter (Signed)
Called and spoke to pt. Pt states a script was sent to pharmacy on 9/8 with one additional refill, pts chart confirms this. Pt has a follow with MW on 9/28. Advised pt to keep follow up and a new script can be sent at follow up if pt continues on that medication. Pt verbalized understanding and denied any further questions or concerns at this time.

## 2020-08-27 DIAGNOSIS — J3089 Other allergic rhinitis: Secondary | ICD-10-CM | POA: Diagnosis not present

## 2020-08-27 DIAGNOSIS — R69 Illness, unspecified: Secondary | ICD-10-CM | POA: Diagnosis not present

## 2020-08-27 DIAGNOSIS — J301 Allergic rhinitis due to pollen: Secondary | ICD-10-CM | POA: Diagnosis not present

## 2020-08-28 DIAGNOSIS — R69 Illness, unspecified: Secondary | ICD-10-CM | POA: Diagnosis not present

## 2020-08-29 DIAGNOSIS — R69 Illness, unspecified: Secondary | ICD-10-CM | POA: Diagnosis not present

## 2020-08-30 DIAGNOSIS — R69 Illness, unspecified: Secondary | ICD-10-CM | POA: Diagnosis not present

## 2020-08-31 DIAGNOSIS — R69 Illness, unspecified: Secondary | ICD-10-CM | POA: Diagnosis not present

## 2020-09-01 DIAGNOSIS — J3089 Other allergic rhinitis: Secondary | ICD-10-CM | POA: Diagnosis not present

## 2020-09-01 DIAGNOSIS — J301 Allergic rhinitis due to pollen: Secondary | ICD-10-CM | POA: Diagnosis not present

## 2020-09-01 DIAGNOSIS — R69 Illness, unspecified: Secondary | ICD-10-CM | POA: Diagnosis not present

## 2020-09-01 DIAGNOSIS — J3081 Allergic rhinitis due to animal (cat) (dog) hair and dander: Secondary | ICD-10-CM | POA: Diagnosis not present

## 2020-09-02 DIAGNOSIS — R69 Illness, unspecified: Secondary | ICD-10-CM | POA: Diagnosis not present

## 2020-09-03 DIAGNOSIS — J301 Allergic rhinitis due to pollen: Secondary | ICD-10-CM | POA: Diagnosis not present

## 2020-09-03 DIAGNOSIS — R69 Illness, unspecified: Secondary | ICD-10-CM | POA: Diagnosis not present

## 2020-09-03 DIAGNOSIS — J3089 Other allergic rhinitis: Secondary | ICD-10-CM | POA: Diagnosis not present

## 2020-09-04 DIAGNOSIS — R69 Illness, unspecified: Secondary | ICD-10-CM | POA: Diagnosis not present

## 2020-09-05 DIAGNOSIS — R69 Illness, unspecified: Secondary | ICD-10-CM | POA: Diagnosis not present

## 2020-09-06 DIAGNOSIS — J301 Allergic rhinitis due to pollen: Secondary | ICD-10-CM | POA: Diagnosis not present

## 2020-09-06 DIAGNOSIS — J3089 Other allergic rhinitis: Secondary | ICD-10-CM | POA: Diagnosis not present

## 2020-09-06 DIAGNOSIS — R69 Illness, unspecified: Secondary | ICD-10-CM | POA: Diagnosis not present

## 2020-09-06 MED FILL — BIKTARVY 50-200-25 MG TABS: 50-200-25 | 30 days supply | Qty: 30 | Fill #2

## 2020-09-07 DIAGNOSIS — R69 Illness, unspecified: Secondary | ICD-10-CM | POA: Diagnosis not present

## 2020-09-08 DIAGNOSIS — R69 Illness, unspecified: Secondary | ICD-10-CM | POA: Diagnosis not present

## 2020-09-08 DIAGNOSIS — J301 Allergic rhinitis due to pollen: Secondary | ICD-10-CM | POA: Diagnosis not present

## 2020-09-08 DIAGNOSIS — J3089 Other allergic rhinitis: Secondary | ICD-10-CM | POA: Diagnosis not present

## 2020-09-09 DIAGNOSIS — R69 Illness, unspecified: Secondary | ICD-10-CM | POA: Diagnosis not present

## 2020-09-10 ENCOUNTER — Ambulatory Visit (INDEPENDENT_AMBULATORY_CARE_PROVIDER_SITE_OTHER): Payer: Medicaid Other | Admitting: Internal Medicine

## 2020-09-10 ENCOUNTER — Other Ambulatory Visit: Payer: Self-pay

## 2020-09-10 ENCOUNTER — Encounter: Payer: Self-pay | Admitting: Internal Medicine

## 2020-09-10 VITALS — BP 135/84 | HR 89 | Temp 98.6°F | Ht 63.0 in | Wt 100.4 lb

## 2020-09-10 DIAGNOSIS — B20 Human immunodeficiency virus [HIV] disease: Secondary | ICD-10-CM | POA: Diagnosis present

## 2020-09-10 DIAGNOSIS — R69 Illness, unspecified: Secondary | ICD-10-CM | POA: Diagnosis not present

## 2020-09-10 DIAGNOSIS — I10 Essential (primary) hypertension: Secondary | ICD-10-CM

## 2020-09-10 DIAGNOSIS — F1190 Opioid use, unspecified, uncomplicated: Secondary | ICD-10-CM

## 2020-09-10 DIAGNOSIS — Z23 Encounter for immunization: Secondary | ICD-10-CM

## 2020-09-10 DIAGNOSIS — E785 Hyperlipidemia, unspecified: Secondary | ICD-10-CM | POA: Diagnosis not present

## 2020-09-10 DIAGNOSIS — M1612 Unilateral primary osteoarthritis, left hip: Secondary | ICD-10-CM

## 2020-09-10 MED ORDER — AMLODIPINE BESYLATE 10 MG PO TABS: 10.0000 mg | ORAL_TABLET | Freq: Every day | ORAL | 1 refills | Status: DC

## 2020-09-10 MED ORDER — MEGESTROL ACETATE 40 MG PO TABS: 40.0000 mg | ORAL_TABLET | Freq: Every day | ORAL | 1 refills | Status: DC

## 2020-09-10 MED ORDER — PRAVASTATIN SODIUM 20 MG PO TABS: 20.0000 mg | ORAL_TABLET | Freq: Every day | ORAL | 2 refills | Status: DC

## 2020-09-10 NOTE — Assessment & Plan Note (Signed)
Currently on reduced dose of pravastatin 20mg  due to interaction with ART. Pt endorses medication compliance. Plan --lipid panel today --continue pravastatin 20mg . Dose adjustment pending lipid panel results.

## 2020-09-10 NOTE — Assessment & Plan Note (Addendum)
Blood pressure at goal in the office today. No adverse medication effects noted. Plan  --BMP --continue amlodipine 10mg 

## 2020-09-10 NOTE — Progress Notes (Signed)
Office Visit   Patient ID: Janice Williams, female    DOB: 1961-10-28, 59 y.o.   MRN: [id number]  Subjective:  CC: chronic hypertension, calorie malnutrition, chronic pain  HPI 59 y.o. presents today for the above issues.  HYPERTENSION FOLLOW-UP: Current medications: amlodipine 10mg  Have you taken blood pressure medication(s) today: yes Med Adherence: [x]  Yes    []  No Medication side effects: []  Yes    [x]  No SOB? []  Yes [x]  No Chest Pain?: []  Yes    [x]  No Leg swelling?: []  Yes [x]  No Headaches?: []  Yes    [x]  No Dizziness? []  Yes    [x]  No   She notes that the megace has improved her appetite and is happy with her weight gain.        ACTIVE MEDICATIONS   Current Outpatient Medications on File Prior to Visit  Medication Sig Dispense Refill  . albuterol (VENTOLIN HFA) 108 (90 Base) MCG/ACT inhaler Inhale 2 puffs into the lungs every 6 (six) hours as needed for wheezing or shortness of breath.    [name] BIKTARVY 50-200-25 MG TABS tablet TAKE 1 TABLET BY MOUTH DAILY. 30 tablet 4  . budesonide-formoterol (SYMBICORT) 80-4.5 MCG/ACT inhaler Take 2 puffs first thing in am and then another 2 puffs about 12 hours later. 1 each 1  . calcium citrate-vitamin D (CITRACAL+D) 315-200 MG-UNIT tablet Take 2 tablets by mouth 2 (two) times daily. 120 tablet 11  . diclofenac sodium (VOLTAREN) 1 % GEL Apply 2 g topically 4 (four) times daily as needed. 100 g 2  . Ensure (ENSURE) Take 237 mLs by mouth 3 (three) times daily between meals. 237 mL 5  . fluticasone (FLONASE) 50 MCG/ACT nasal spray Place 1-2 sprays into both nostrils daily for 7 days. 1 g 0  . hydrocortisone 2.5 % cream Apply rectally 2 times daily    . loratadine (CLARITIN) 10 MG tablet Take 1 tablet (10 mg total) by mouth daily. 90 tablet 3  . omeprazole (PRILOSEC) 20 MG capsule TAKE 1 CAPSULE BY MOUTH EVERY DAY 30 capsule 2  . ondansetron (ZOFRAN) 4 MG tablet Take 1 tablet (4 mg total) by mouth every 8 (eight) hours as needed for  nausea or vomiting. 20 tablet 0  . polyethylene glycol (MIRALAX / GLYCOLAX) packet Take one capful in 8 oz of fluid of your choice twice a day until having soft stools, then back down to once a day.  If not having soft stools with twice a day, increased to three times a day 100 each 0  . traMADol (ULTRAM) 50 MG tablet TAKE 1 TABLET(50 MG) BY MOUTH EVERY 12 HOURS AS NEEDED FOR PAIN 60 tablet 2  . Witch Hazel (TUCKS) 50 % PADS Use as needed for rectal itching, after having BM 40 each 0   No current facility-administered medications on file prior to visit.    ROS  Review of Systems  Constitutional: Negative for chills and fever.  Respiratory: Negative.   Cardiovascular: Negative.   Musculoskeletal: Positive for arthralgias and back pain.  Neurological: Negative.     Objective:   BP 135/84 (BP Location: Right Arm, Patient Position: Sitting, Cuff Size: Small)   Pulse 89   Temp 98.6 F (37 C) (Oral)   Ht 5\' 3"  (1.6 m)   Wt 100 lb 6.4 oz (45.5 kg)   SpO2 100%   BMI 17.79 kg/m  Wt Readings from Last 3 Encounters:  09/10/20 100 lb 6.4 oz (45.5 kg)  07/19/20 97 lb  1.6 oz (44 kg)  06/09/20 97 lb 9.6 oz (44.3 kg)   BP Readings from Last 3 Encounters:  09/10/20 135/84  07/19/20 119/82  06/09/20 108/74   Physical Exam Constitutional:      Appearance: Normal appearance.  Cardiovascular:     Rate and Rhythm: Normal rate and regular rhythm.  Pulmonary:     Effort: Pulmonary effort is normal.     Breath sounds: Normal breath sounds.     Health Maintenance:   Health Maintenance  Topic Date Due  . INFLUENZA VACCINE  07/18/2020  . PAP SMEAR-Modifier  02/19/2021  . MAMMOGRAM  10/05/2021  . COLONOSCOPY  11/21/2021  . TETANUS/TDAP  06/06/2022  . COVID-19 Vaccine  Completed  . Hepatitis C Screening  Completed  . HIV Screening  Completed     Assessment & Plan:   Problem List Items Addressed This Visit      Cardiovascular and Mediastinum   Hypertension    Blood pressure at  goal in the office today. No adverse medication effects noted. Plan  --BMP --continue amlodipine 10mg       Relevant Medications   amLODipine (NORVASC) 10 MG tablet   pravastatin (PRAVACHOL) 20 MG tablet   Other Relevant Orders   Basic metabolic panel     Other   Human immunodeficiency virus (HIV) disease ([place]) - Primary    Pt recently started on megace. Up 3lb since beginning of august.  Plan: refill sent to pharmacy. Continue ensure. Weight monitoring.      Relevant Medications   megestrol (MEGACE) 40 MG tablet   Hyperlipidemia    Currently on reduced dose of pravastatin 20mg  due to interaction with ART. Pt endorses medication compliance. Plan --lipid panel today --continue pravastatin 20mg . Dose adjustment pending lipid panel results.      Relevant Medications   amLODipine (NORVASC) 10 MG tablet   pravastatin (PRAVACHOL) 20 MG tablet   Other Relevant Orders   Lipid panel   Chronic, continuous use of opioids    Current mediations: tramadol 50mg  Indication: chronic knee and cervical spine pain Denies significant side effects including sedation and constipation. Medication not interfering with ADLs Last UDS 12/2019 and appropriate PDMP reviewed and appropriate Pain contract in place   Plan: continue current management        Other Visit Diagnoses    Osteoarthritis of left hip, unspecified osteoarthritis type       Relevant Medications   megestrol (MEGACE) 40 MG tablet        Pt discussed with Dr. Adolm Joseph, MD Internal Medicine Resident PGY-2 [facility] Pager: [phone] 09/10/2020 10:22 AM

## 2020-09-10 NOTE — Assessment & Plan Note (Signed)
Current mediations: tramadol 50mg  Indication: chronic knee and cervical spine pain Denies significant side effects including sedation and constipation. Medication not interfering with ADLs Last UDS 12/2019 and appropriate PDMP reviewed and appropriate Pain contract in place   Plan: continue current management

## 2020-09-10 NOTE — Assessment & Plan Note (Addendum)
Pt recently started on megace. Up 3lb since beginning of august.  Plan: refill sent to pharmacy. Continue ensure. Weight monitoring.

## 2020-09-11 DIAGNOSIS — R69 Illness, unspecified: Secondary | ICD-10-CM | POA: Diagnosis not present

## 2020-09-12 DIAGNOSIS — R69 Illness, unspecified: Secondary | ICD-10-CM | POA: Diagnosis not present

## 2020-09-13 DIAGNOSIS — R69 Illness, unspecified: Secondary | ICD-10-CM | POA: Diagnosis not present

## 2020-09-13 NOTE — Progress Notes (Signed)
Internal Medicine Clinic Attending  Case discussed with Dr. Christian  At the time of the visit.  We reviewed the resident's history and exam and pertinent patient test results.  I agree with the assessment, diagnosis, and plan of care documented in the resident's note.  

## 2020-09-14 ENCOUNTER — Ambulatory Visit (INDEPENDENT_AMBULATORY_CARE_PROVIDER_SITE_OTHER): Payer: Medicaid Other

## 2020-09-14 ENCOUNTER — Other Ambulatory Visit: Payer: Self-pay

## 2020-09-14 ENCOUNTER — Ambulatory Visit: Payer: Medicaid Other | Admitting: Internal Medicine

## 2020-09-14 ENCOUNTER — Other Ambulatory Visit: Payer: Self-pay | Admitting: Pulmonary Disease

## 2020-09-14 ENCOUNTER — Encounter: Payer: Self-pay | Admitting: Internal Medicine

## 2020-09-14 DIAGNOSIS — J449 Chronic obstructive pulmonary disease, unspecified: Secondary | ICD-10-CM | POA: Diagnosis not present

## 2020-09-14 DIAGNOSIS — J90 Pleural effusion, not elsewhere classified: Secondary | ICD-10-CM | POA: Diagnosis not present

## 2020-09-14 IMAGING — DX DG CHEST 2V
2 series · 2 of 2 positions shown · non-contrast
Comparison: [DATE].

CLINICAL DATA: COPD.

EXAM:
CHEST - 2 VIEW

[chest pa]
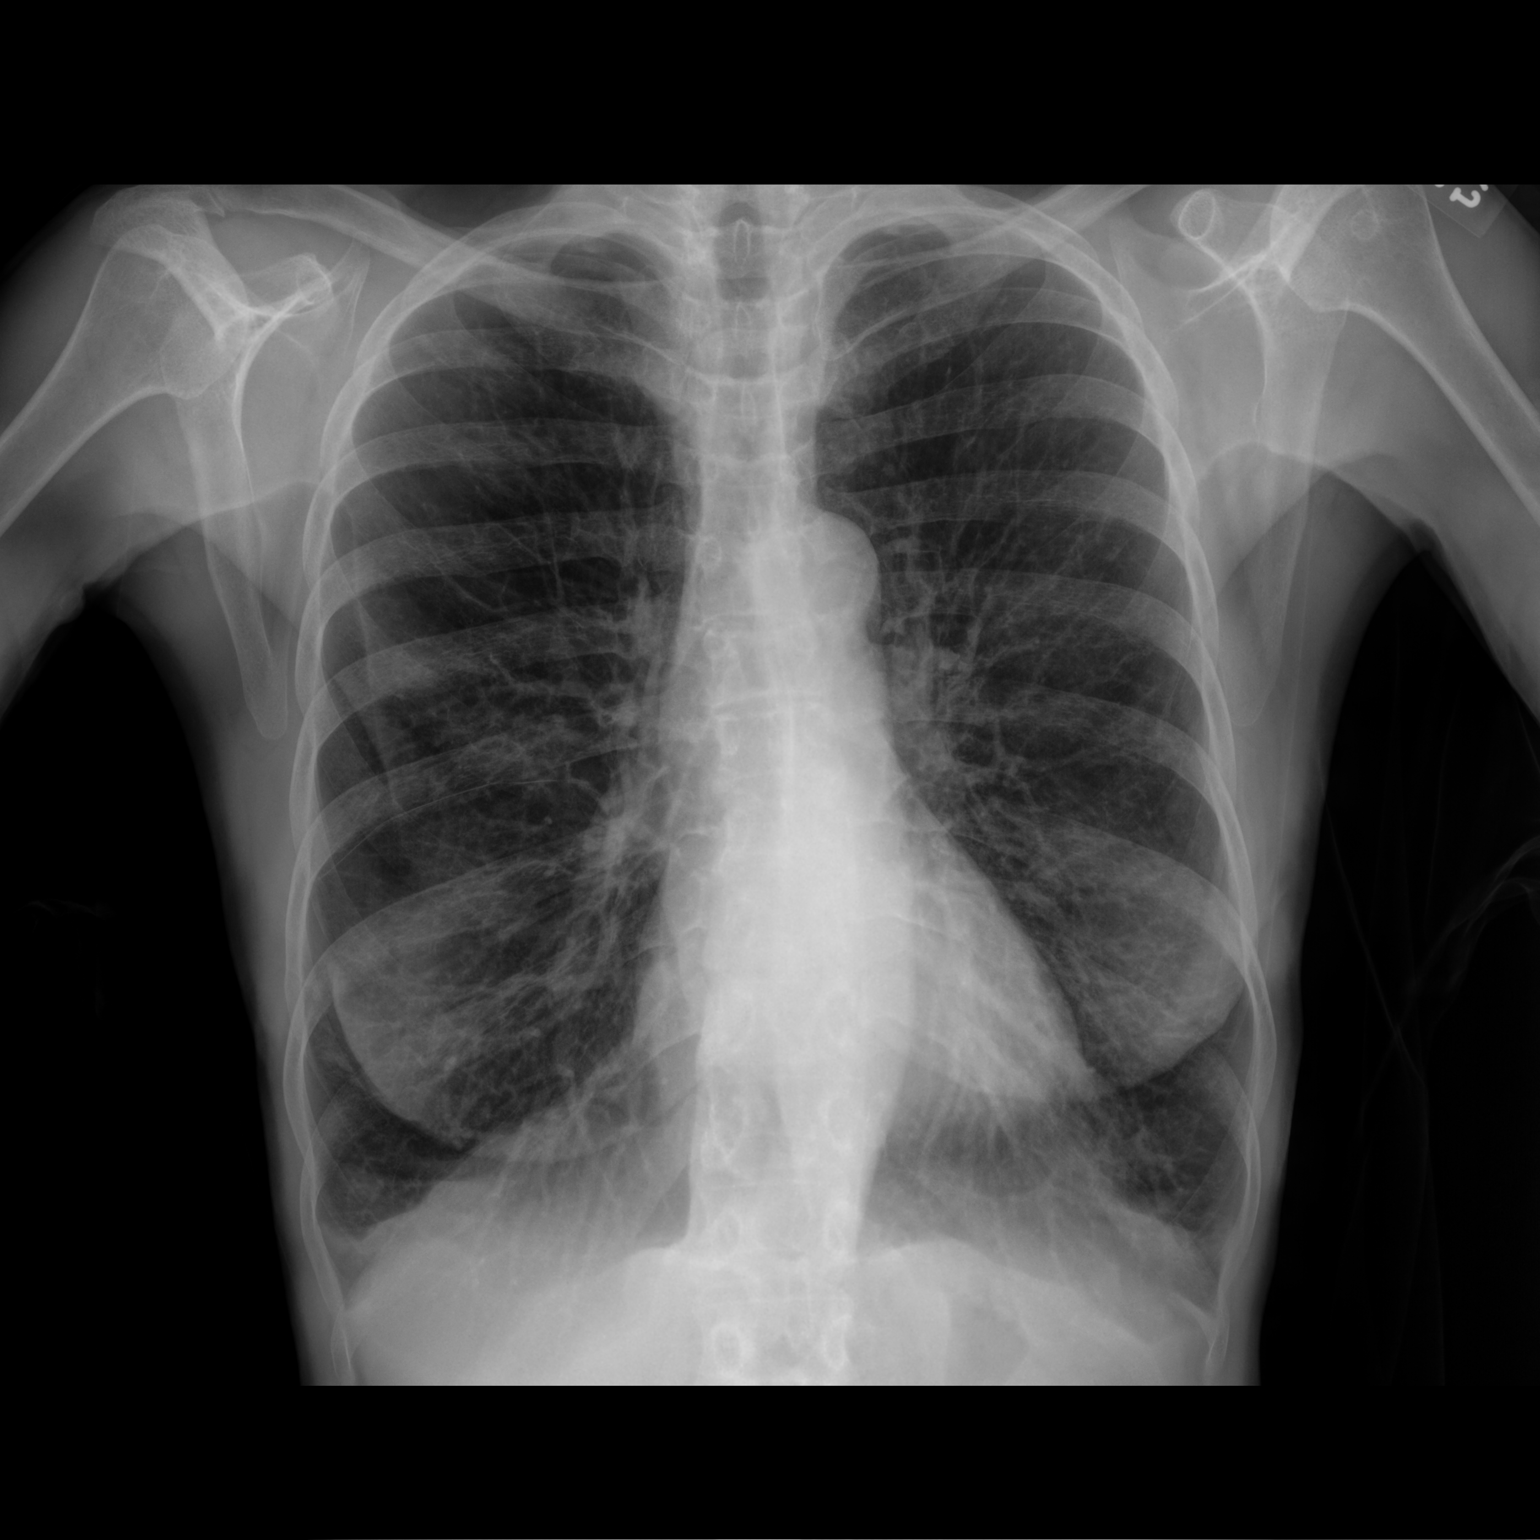

[chest lat]
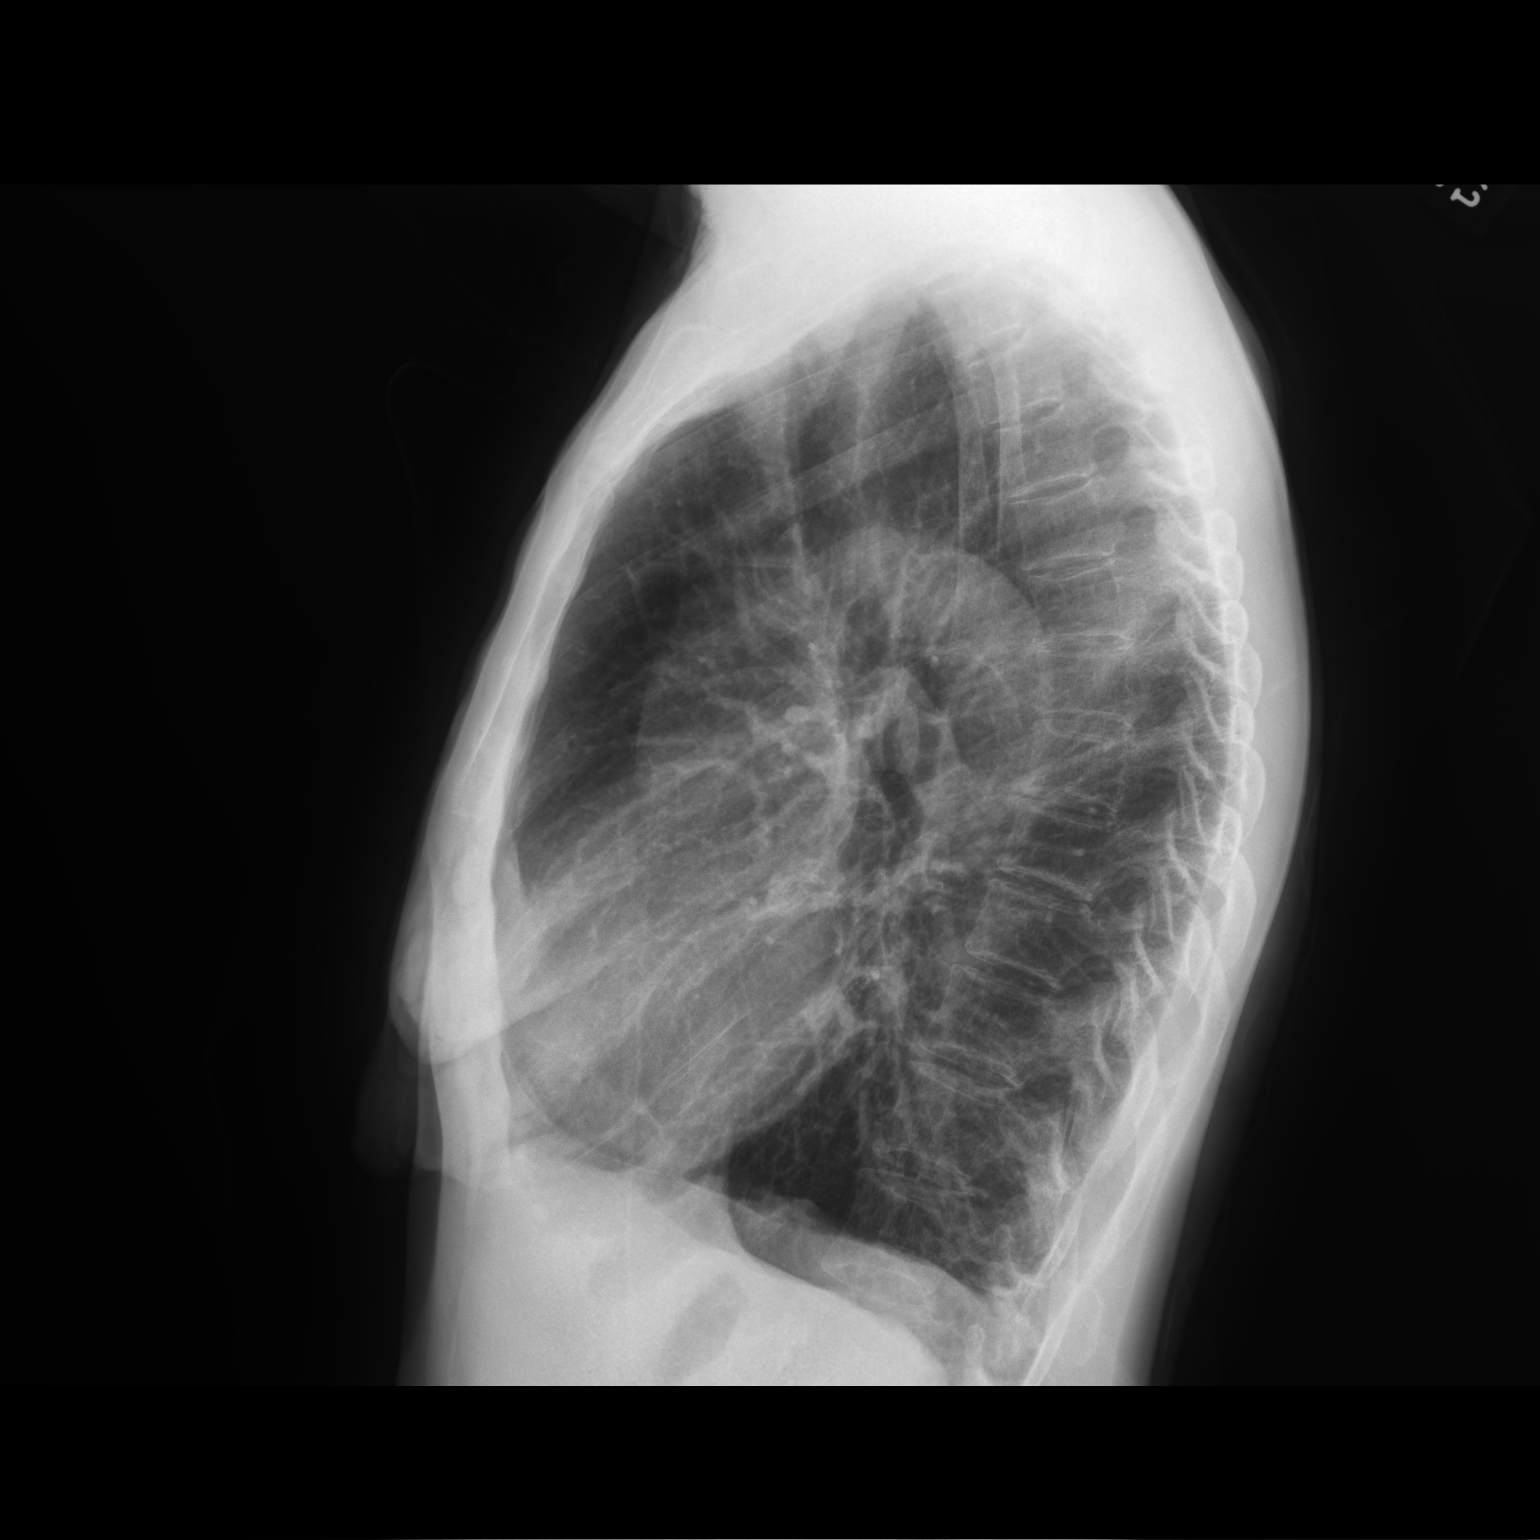

[2 of 2 positions shown; findings below may reference images not displayed]

FINDINGS: The heart size and mediastinal contours are within normal limits.
Both lungs are clear. Hyperexpansion of the lungs is noted. No
pneumothorax or pleural effusion is noted. The visualized skeletal
structures are unremarkable.
IMPRESSION: No active cardiopulmonary disease.

## 2020-09-14 NOTE — Patient Instructions (Signed)
Schedule PFT's at your convenience and I will call you the results  Please remember to go to the  x-ray department  for your tests - we will call you with the results when they are available    Please schedule a follow up visit in 12 months but call sooner if needed

## 2020-09-14 NOTE — Progress Notes (Signed)
Janice Williams, female    DOB: 08-31-1961,    MRN: [id number]   Brief patient profile:  66  yobf  HIV 2008 Quit smoking in 2014 cough / sob rx saba then symbicort which helped but still coughing so referred to pulmonary clinic 07/08/2019 by [name]   Started allergy shots Aug 2021  Plus zyrtec from Dr [name]     History of Present Illness  07/08/2019  Pulmonary/ 1st office eval/Saniyyah Elster  Chief Complaint  Patient presents with  . Pulmonary Consult    Referred by [name], NP for eval of COPD. Pt c/o cough for the past several months- occ prod with clear sputum. She uses her albuterol inhaler about 2 x per day.   Dyspnea:  MMRC3 = can't walk 100 yards even at a slow pace at a flat grade s stopping due to sob  mb to house and has to stop landing to catch here back slt uphill and 2 steps to top landing  Cough: starts a few hours p supper much worse lying > min clear mucus Sleep: p drinking water settles down able to sleep s noct or am flare  SABA use: helps  pred def helps Hb variable, uses ppi prn  rec Prednisone 10 mg take  4 each am x 2 days,   2 each am x 2 days,  1 each am x 2 days and stop  Plan A = Automatic = symbicort 80 Take 2 puffs first thing in am and then another 2 puffs about 12 hours later.  Work on inhaler technique:    Plan B = Backup Only use your albuterol inhaler as a rescue medication   08/11/2019  f/u ov/Saira Kramme re: presumed copd / maint on symb 80 2bid though hfa poor Chief Complaint  Patient presents with  . Follow-up    Breathing is doing well and she has not used her rescue inhaler.   Dyspnea:  MMRC2 = can't walk a nl pace on a flat grade s sob but does fine slow and flat  Cough: none Sleeping: able to lie flat bed with 2 pillows  SABA use: very little  rec No change in medications Work on inhaler technique:  06/02/20 ER eval for aecopd > doxy / pred   06/09/2020  f/u post ER ov/Crecencio Kwiatek re: copd presumed / flare with nasal congestion  on doxy no  prednisone  Chief Complaint  Patient presents with  . Follow-up   Dyspnea:  MMRC2 = can't walk a nl pace on a flat grade s sob but does fine slow and flat  Cough:  Rattling congested light green improving on doxy / did not take pred yet "too strong"  Sleeping: 30 degrees on flat bed with pillows SABA use: rarely using but when she does it's "after she overdoes it"  02: none rec Plan A = Automatic = Always=    Breztri or symbicort 160 Take 2 puffs first thing in am and then another 2 puffs about 12 hours later.  Work on inhaler technique:   Prednisone one half daily until gone  with bfast and finish the doxy   Plan B = Backup (to supplement plan A, not to replace it) Only use your albuterol inhaler as a rescue medication  Please remember to go to the  x-ray department  for your tests - we will call you with the results when they are available  clariton d is as needed when nose is clogged  Please schedule  a follow up visit in 3 months but call sooner if needed with pfts     09/14/2020  f/u ov/Catheleen Langhorne re: copd ? Gold level/  symb 80 2bid  Chief Complaint  Patient presents with  . Follow-up    Patient is feeling good since last visit. Has slight cough in the morning with green sputum and then it is clear the rest of the day.    Dyspnea: MMRC2 = can't walk a nl pace on a flat grade s sob but does fine slow and flat  Cough: just in am's a little green mucus occ in am's then  good to go the rest of the day Sleeping: 30 degrees pillows  SABA use: none  02: none    No obvious day to day or daytime variability or assoc mucus plugs or hemoptysis or cp or chest tightness, subjective wheeze or overt sinus or hb symptoms.   Sleeping  without nocturnal  or early am exacerbation  of respiratory  c/o's or need for noct saba. Also denies any obvious fluctuation of symptoms with weather or environmental changes or other aggravating or alleviating factors except as outlined above   No unusual exposure hx  or h/o childhood pna/ asthma or knowledge of premature birth.  Current Allergies, Complete Past Medical History, Past Surgical History, Family History, and Social History were reviewed in [employer] [employer] record.  ROS  The following are not active complaints unless bolded Hoarseness, sore throat, dysphagia, dental problems, itching, sneezing,  nasal congestion or discharge of excess mucus or purulent secretions, ear ache,   fever, chills, sweats, unintended wt loss or wt gain, classically pleuritic or exertional cp,  orthopnea pnd or arm/hand swelling  or leg swelling, presyncope, palpitations, abdominal pain, anorexia, nausea, vomiting, diarrhea  or change in bowel habits or change in bladder habits, change in stools or change in urine, dysuria, hematuria,  rash, arthralgias, visual complaints, headache, numbness, weakness or ataxia or problems with walking or coordination,  change in mood or  memory.        Current Meds  Medication Sig  . albuterol (VENTOLIN HFA) 108 (90 Base) MCG/ACT inhaler Inhale 2 puffs into the lungs every 6 (six) hours as needed for wheezing or shortness of breath.  [name] amLODipine (NORVASC) 10 MG tablet Take 1 tablet (10 mg total) by mouth daily.  [name] BIKTARVY 50-200-25 MG TABS tablet TAKE 1 TABLET BY MOUTH DAILY.  . budesonide-formoterol (SYMBICORT) 80-4.5 MCG/ACT inhaler Take 2 puffs first thing in am and then another 2 puffs about 12 hours later.  . calcium citrate-vitamin D (CITRACAL+D) 315-200 MG-UNIT tablet Take 2 tablets by mouth 2 (two) times daily.  . diclofenac sodium (VOLTAREN) 1 % GEL Apply 2 g topically 4 (four) times daily as needed.  . Ensure (ENSURE) Take 237 mLs by mouth 3 (three) times daily between meals.  . fluticasone (FLONASE) 50 MCG/ACT nasal spray Place 1-2 sprays into both nostrils daily for 7 days.  . hydrocortisone 2.5 % cream Apply rectally 2 times daily  . loratadine (CLARITIN) 10 MG tablet Take 1 tablet (10 mg total) by mouth  daily.  . megestrol (MEGACE) 40 MG tablet Take 1 tablet (40 mg total) by mouth daily.  [name] omeprazole (PRILOSEC) 20 MG capsule TAKE 1 CAPSULE BY MOUTH EVERY DAY  . ondansetron (ZOFRAN) 4 MG tablet Take 1 tablet (4 mg total) by mouth every 8 (eight) hours as needed for nausea or vomiting.  . polyethylene glycol (MIRALAX / GLYCOLAX) packet  Take one capful in 8 oz of fluid of your choice twice a day until having soft stools, then back down to once a day.  If not having soft stools with twice a day, increased to three times a day  . pravastatin (PRAVACHOL) 20 MG tablet Take 1 tablet (20 mg total) by mouth daily.  . traMADol (ULTRAM) 50 MG tablet TAKE 1 TABLET(50 MG) BY MOUTH EVERY 12 HOURS AS NEEDED FOR PAIN  . Witch Hazel (TUCKS) 50 % PADS Use as needed for rectal itching, after having BM                   Past Medical History:  Diagnosis Date  . Allergy   . Anemia   . Arthritis      lower back, knees  . Bronchitis   . COPD (chronic obstructive pulmonary disease) ([place])   . Former smoker    quit 2014  . GERD (gastroesophageal reflux disease)   . HIV infection ([place])   . Hypertension   . Stroke ([place])   . Substance abuse ([place])    history, clean 7 years  . SVD (spontaneous vaginal delivery)    x 3     Objective:    09/14/2020        98 06/09/2020        97  08/11/19 100 lb 3.2 oz (45.5 kg)  07/08/19 102 lb (46.3 kg)  12/02/18 99 lb (44.9 kg)    Vital signs reviewed  09/14/2020  - Note at rest 02 sats  99% on RA         HEENT : pt wearing mask not removed for exam due to covid - 19 concerns.    NECK :  without JVD/Nodes/TM/ nl carotid upstrokes bilaterally   LUNGS: no acc muscle use,  Mild barrel  contour chest wall with bilateral  Distant bs s audible wheeze and  without cough on insp or exp maneuvers  and mild  Hyperresonant  to  percussion bilaterally     CV:  RRR  no s3 or murmur or increase in P2, and no edema   ABD:  soft and nontender with pos end  insp Hoover's   in the supine position. No bruits or organomegaly appreciated, bowel sounds nl  MS:   Nl gait/  ext warm without deformities, calf tenderness, cyanosis or clubbing No obvious joint restrictions   SKIN: warm and dry without lesions    NEURO:  alert, approp, nl sensorium with  no motor or cerebellar deficits apparent.        CXR PA and Lateral:   09/14/2020 :    I personally reviewed images and agree with radiology impression as follows:    severe copd / mild kyphosis        Assessment

## 2020-09-15 ENCOUNTER — Encounter: Payer: Self-pay | Admitting: Internal Medicine

## 2020-09-15 DIAGNOSIS — J3089 Other allergic rhinitis: Secondary | ICD-10-CM | POA: Diagnosis not present

## 2020-09-15 DIAGNOSIS — J301 Allergic rhinitis due to pollen: Secondary | ICD-10-CM | POA: Diagnosis not present

## 2020-09-15 DIAGNOSIS — R69 Illness, unspecified: Secondary | ICD-10-CM | POA: Diagnosis not present

## 2020-09-15 NOTE — Assessment & Plan Note (Signed)
Quit smoking 2014  -08/11/2019  After extensive coaching inhaler device,  effectiveness =    50% from baseline 25% (short Ti) > continue symb 80 2bid  - 06/09/2020  After extensive coaching inhaler device,  effectiveness =    75% > try breztri 2bid if insurance covers> no change on breztri so stayed with symbicort 80 which is reasonable given HIV status and concern with high dose ICS in breztri  Adequate control on present rx, reviewed in detail with pt > no change in rx needed     Needs pfts to complete the w/u.   F/u can be yearly          Each maintenance medication was reviewed in detail including emphasizing most importantly the difference between maintenance and prns and under what circumstances the prns are to be triggered using an action plan format where appropriate.  Total time for H and P, chart review, counseling, teaching device and generating customized AVS unique to this office visit / charting = 10 min

## 2020-09-15 NOTE — Progress Notes (Signed)
Routing to [name] to address his he saw the patient.

## 2020-09-15 NOTE — Progress Notes (Signed)
Called and spoke with patient about xray results per Dr Melvyn Novas. All questions answered and patient expressed full understanding. Nothing further needed at this time.

## 2020-09-16 DIAGNOSIS — R69 Illness, unspecified: Secondary | ICD-10-CM | POA: Diagnosis not present

## 2020-09-17 DIAGNOSIS — Z419 Encounter for procedure for purposes other than remedying health state, unspecified: Secondary | ICD-10-CM | POA: Diagnosis not present

## 2020-09-19 ENCOUNTER — Other Ambulatory Visit: Payer: Self-pay | Admitting: Internal Medicine

## 2020-09-22 ENCOUNTER — Encounter: Payer: Self-pay | Admitting: Internal Medicine

## 2020-09-22 ENCOUNTER — Ambulatory Visit (INDEPENDENT_AMBULATORY_CARE_PROVIDER_SITE_OTHER): Payer: Medicaid Other | Admitting: Internal Medicine

## 2020-09-22 ENCOUNTER — Other Ambulatory Visit: Payer: Self-pay

## 2020-09-22 VITALS — BP 124/85 | HR 87 | Temp 97.5°F | Resp 16 | Ht 63.0 in | Wt 99.3 lb

## 2020-09-22 DIAGNOSIS — M25552 Pain in left hip: Secondary | ICD-10-CM

## 2020-09-22 DIAGNOSIS — Z0000 Encounter for general adult medical examination without abnormal findings: Secondary | ICD-10-CM

## 2020-09-22 DIAGNOSIS — B20 Human immunodeficiency virus [HIV] disease: Secondary | ICD-10-CM | POA: Diagnosis not present

## 2020-09-22 DIAGNOSIS — Z7689 Persons encountering health services in other specified circumstances: Secondary | ICD-10-CM | POA: Diagnosis not present
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
## 2020-09-23 LAB — T-HELPER CELL (CD4) - (RCID CLINIC ONLY)
CD4 % Helper T Cell: 42 % (ref 33–65)
CD4 T Cell Abs: 886 /uL (ref 400–1790)

## 2020-09-24 DIAGNOSIS — Z7689 Persons encountering health services in other specified circumstances: Secondary | ICD-10-CM | POA: Diagnosis not present

## 2020-09-24 DIAGNOSIS — J301 Allergic rhinitis due to pollen: Secondary | ICD-10-CM | POA: Diagnosis not present

## 2020-09-24 DIAGNOSIS — J3089 Other allergic rhinitis: Secondary | ICD-10-CM | POA: Diagnosis not present

## 2020-09-25 LAB — CBC WITH DIFFERENTIAL/PLATELET
Absolute Monocytes: 357 cells/uL (ref 200–950)
Basophils Absolute: 28 cells/uL (ref 0–200)
Basophils Relative: 0.6 %
Eosinophils Absolute: 38 cells/uL (ref 15–500)
Eosinophils Relative: 0.8 %
HCT: 35.2 % (ref 35.0–45.0)
Hemoglobin: 12.3 g/dL (ref 11.7–15.5)
Lymphs Abs: 2233 cells/uL (ref 850–3900)
MCH: 33.5 pg — ABNORMAL HIGH (ref 27.0–33.0)
MCHC: 34.9 g/dL (ref 32.0–36.0)
MCV: 95.9 fL (ref 80.0–100.0)
MPV: 9.4 fL (ref 7.5–12.5)
Monocytes Relative: 7.6 %
Neutro Abs: 2045 cells/uL (ref 1500–7800)
Neutrophils Relative %: 43.5 %
Platelets: 320 10*3/uL (ref 140–400)
RBC: 3.67 10*6/uL — ABNORMAL LOW (ref 3.80–5.10)
RDW: 12.1 % (ref 11.0–15.0)
Total Lymphocyte: 47.5 %
WBC: 4.7 10*3/uL (ref 3.8–10.8)

## 2020-09-25 LAB — RPR: RPR Ser Ql: NONREACTIVE

## 2020-09-25 LAB — HIV-1 RNA QUANT-NO REFLEX-BLD
HIV 1 RNA Quant: <20 Copies/mL — ABNORMAL HIGH
HIV-1 RNA Quant, Log: <1.3 Log cps/mL — ABNORMAL HIGH

## 2020-09-25 LAB — COMPLETE METABOLIC PANEL WITH GFR
AG Ratio: 1.8 (calc) (ref 1.0–2.5)
ALT: 6 U/L (ref 6–29)
AST: 15 U/L (ref 10–35)
Albumin: 4.5 g/dL (ref 3.6–5.1)
Alkaline phosphatase (APISO): 42 U/L (ref 37–153)
BUN/Creatinine Ratio: 16 (calc) (ref 6–22)
BUN: 20 mg/dL (ref 7–25)
CO2: 26 mmol/L (ref 20–32)
Calcium: 10.3 mg/dL (ref 8.6–10.4)
Chloride: 104 mmol/L (ref 98–110)
Creat: 1.25 mg/dL — ABNORMAL HIGH (ref 0.50–0.99)
GFR, Est African American: 54 mL/min/{1.73_m2} — ABNORMAL LOW (ref >=60)
GFR, Est Non African American: 47 mL/min/{1.73_m2} — ABNORMAL LOW (ref >=60)
Globulin: 2.5 g/dL (calc) (ref 1.9–3.7)
Glucose, Bld: 101 mg/dL — ABNORMAL HIGH (ref 65–99)
Potassium: 4.6 mmol/L (ref 3.5–5.3)
Sodium: 140 mmol/L (ref 135–146)
Total Bilirubin: 0.5 mg/dL (ref 0.2–1.2)
Total Protein: 7 g/dL (ref 6.1–8.1)

## 2020-09-29 ENCOUNTER — Other Ambulatory Visit: Payer: Self-pay | Admitting: Internal Medicine

## 2020-09-29 DIAGNOSIS — M1612 Unilateral primary osteoarthritis, left hip: Secondary | ICD-10-CM

## 2020-09-29 MED ORDER — TRAMADOL HCL 50 MG PO TABS: ORAL_TABLET | ORAL | 2 refills | Status: DC

## 2020-09-29 NOTE — Telephone Encounter (Signed)
Need refill on traMADol (ULTRAM) 50 MG tablet ;pt contact [facility] [place] AT [place]

## 2020-09-29 NOTE — Telephone Encounter (Signed)
Pls contact pt regarding medicine [phone] 

## 2020-10-01 DIAGNOSIS — Z7689 Persons encountering health services in other specified circumstances: Secondary | ICD-10-CM | POA: Diagnosis not present

## 2020-10-01 DIAGNOSIS — J3089 Other allergic rhinitis: Secondary | ICD-10-CM | POA: Diagnosis not present

## 2020-10-01 DIAGNOSIS — J301 Allergic rhinitis due to pollen: Secondary | ICD-10-CM | POA: Diagnosis not present

## 2020-10-06 DIAGNOSIS — Z7689 Persons encountering health services in other specified circumstances: Secondary | ICD-10-CM | POA: Diagnosis not present

## 2020-10-06 DIAGNOSIS — J3089 Other allergic rhinitis: Secondary | ICD-10-CM | POA: Diagnosis not present

## 2020-10-06 DIAGNOSIS — J301 Allergic rhinitis due to pollen: Secondary | ICD-10-CM | POA: Diagnosis not present

## 2020-10-07 MED FILL — BIKTARVY 50-200-25 MG TABS: 50-200-25 | 30 days supply | Qty: 30 | Fill #3

## 2020-10-13 ENCOUNTER — Encounter: Payer: Self-pay | Admitting: Internal Medicine

## 2020-10-13 DIAGNOSIS — M5030 Other cervical disc degeneration, unspecified cervical region: Secondary | ICD-10-CM | POA: Insufficient documentation

## 2020-10-15 DIAGNOSIS — J301 Allergic rhinitis due to pollen: Secondary | ICD-10-CM | POA: Diagnosis not present

## 2020-10-15 DIAGNOSIS — J3089 Other allergic rhinitis: Secondary | ICD-10-CM | POA: Diagnosis not present

## 2020-10-15 DIAGNOSIS — J3081 Allergic rhinitis due to animal (cat) (dog) hair and dander: Secondary | ICD-10-CM | POA: Diagnosis not present

## 2020-10-18 ENCOUNTER — Telehealth: Payer: Self-pay | Admitting: Internal Medicine

## 2020-10-18 ENCOUNTER — Other Ambulatory Visit: Payer: Self-pay | Admitting: Internal Medicine

## 2020-10-18 DIAGNOSIS — Z419 Encounter for procedure for purposes other than remedying health state, unspecified: Secondary | ICD-10-CM | POA: Diagnosis not present

## 2020-10-18 MED ORDER — BUDESONIDE-FORMOTEROL FUMARATE 80-4.5 MCG/ACT IN AERO: INHALATION_SPRAY | RESPIRATORY_TRACT | 11 refills | Status: DC

## 2020-10-18 NOTE — Telephone Encounter (Signed)
Spoke with pt and advised that 11 Symbicort refills were sent to pharmacy on 09/20/20. Pt states she spoke with pharmacy and they showed no refills. Pt advised that I sent refills for Symbicort to pharmacy requested. Nothing further needed at this time.

## 2020-10-20 ENCOUNTER — Other Ambulatory Visit: Payer: Self-pay

## 2020-10-20 DIAGNOSIS — J301 Allergic rhinitis due to pollen: Secondary | ICD-10-CM | POA: Diagnosis not present

## 2020-10-20 DIAGNOSIS — J3089 Other allergic rhinitis: Secondary | ICD-10-CM | POA: Diagnosis not present

## 2020-10-20 DIAGNOSIS — Z7689 Persons encountering health services in other specified circumstances: Secondary | ICD-10-CM | POA: Diagnosis not present

## 2020-10-20 NOTE — Telephone Encounter (Signed)
Requesting nasal spray to be filled, please call pt back.

## 2020-10-21 ENCOUNTER — Telehealth: Payer: Self-pay | Admitting: *Deleted

## 2020-10-22 MED ORDER — FLUTICASONE PROPIONATE 50 MCG/ACT NA SUSP: 1.0000 | Freq: Every day | NASAL | 2 refills | Status: DC

## 2020-10-22 NOTE — Telephone Encounter (Signed)
Spoke with patient on the phone. Describes clear rhinorrhea and congestion. Denies fever, chills, or purulent nasal discharge. Will refill flonase, and told she can add OTC zyrtec or claritin. She will call if she develops the above infectious symptoms.

## 2020-10-26 DIAGNOSIS — J301 Allergic rhinitis due to pollen: Secondary | ICD-10-CM | POA: Diagnosis not present

## 2020-10-26 DIAGNOSIS — J3089 Other allergic rhinitis: Secondary | ICD-10-CM | POA: Diagnosis not present

## 2020-10-26 DIAGNOSIS — Z7689 Persons encountering health services in other specified circumstances: Secondary | ICD-10-CM | POA: Diagnosis not present

## 2020-11-02 DIAGNOSIS — Z7689 Persons encountering health services in other specified circumstances: Secondary | ICD-10-CM | POA: Diagnosis not present

## 2020-11-05 DIAGNOSIS — J3089 Other allergic rhinitis: Secondary | ICD-10-CM | POA: Diagnosis not present

## 2020-11-05 DIAGNOSIS — J301 Allergic rhinitis due to pollen: Secondary | ICD-10-CM | POA: Diagnosis not present

## 2020-11-05 DIAGNOSIS — Z7689 Persons encountering health services in other specified circumstances: Secondary | ICD-10-CM | POA: Diagnosis not present

## 2020-11-09 DIAGNOSIS — J301 Allergic rhinitis due to pollen: Secondary | ICD-10-CM | POA: Diagnosis not present

## 2020-11-09 DIAGNOSIS — J3089 Other allergic rhinitis: Secondary | ICD-10-CM | POA: Diagnosis not present

## 2020-11-09 DIAGNOSIS — Z7689 Persons encountering health services in other specified circumstances: Secondary | ICD-10-CM | POA: Diagnosis not present

## 2020-11-09 MED FILL — BIKTARVY 50-200-25 MG TABS: 50-200-25 | 30 days supply | Qty: 30 | Fill #4

## 2020-11-13 ENCOUNTER — Ambulatory Visit
Admission: EM | Admit: 2020-11-13 | Discharge: 2020-11-13 | Disposition: A | Discharge status: Home / Self Care | Payer: Medicaid Other

## 2020-11-13 ENCOUNTER — Encounter: Payer: Self-pay | Admitting: *Deleted

## 2020-11-13 ENCOUNTER — Other Ambulatory Visit: Payer: Self-pay

## 2020-11-13 DIAGNOSIS — J069 Acute upper respiratory infection, unspecified: Secondary | ICD-10-CM

## 2020-11-13 DIAGNOSIS — Z20822 Contact with and (suspected) exposure to covid-19: Secondary | ICD-10-CM | POA: Diagnosis not present

## 2020-11-13 MED ORDER — FLUTICASONE PROPIONATE 50 MCG/ACT NA SUSP
1.0000 | Freq: Every day | NASAL | 0 refills | Status: DC
Start: 2020-11-13 — End: 2021-03-17

## 2020-11-13 MED ORDER — CETIRIZINE HCL 10 MG PO TABS: 10.0000 mg | ORAL_TABLET | Freq: Every day | ORAL | 0 refills | Status: DC

## 2020-11-13 MED ORDER — BENZONATATE 100 MG PO CAPS: 100.0000 mg | ORAL_CAPSULE | Freq: Three times a day (TID) | ORAL | 0 refills | Status: DC

## 2020-11-13 NOTE — ED Triage Notes (Signed)
C/O having chills and cough 2 nights ago with some SOB; states used inhaler with improvement.  Denies any SOB at present.  C/O sinus congestion and HA.

## 2020-11-13 NOTE — Discharge Instructions (Addendum)

## 2020-11-13 NOTE — ED Provider Notes (Signed)
[facility]    CSN: [id number] Arrival date & time: 11/13/20  1401      History   Chief Complaint Chief Complaint  Patient presents with  . Nasal Congestion  . Headache    HPI Janice Williams is a 59 y.o. female  With extensive history as below presenting for 2-day course of subjective fever, chills, dry cough, clear rhinorrhea and nasal congestion.  Patient has used inhaler with improvement of chest tightness.  Denies wheezing, vomiting, diarrhea, abdominal pain, chest pain.  Is fully Covid vaccinated with booster.  States she has had family members who have been sick as well.  Past Medical History:  Diagnosis Date  . Allergy   . Anemia   . Arthritis    lower back, knees  . Bronchitis   . COPD (chronic obstructive pulmonary disease) ([place])   . COPD with acute exacerbation ([place]) 06/09/2017  . Dental abscess 03/15/2015  . DYSPEPSIA 02/14/2007   Qualifier: Diagnosis of  By: Samara Snide    . EXTERNAL OTITIS 01/06/2010   Qualifier: Diagnosis of  By: Tomma Lightning MD, Claiborne Billings    . FACIAL RASH 02/04/2009   Qualifier: Diagnosis of  By: Rhunette Croft    . Former smoker    quit 2014  . GERD (gastroesophageal reflux disease)   . HIV infection ([place])   . Hypertension   . Stroke ([place])   . Substance abuse ([place])    history, clean 7 years  . SVD (spontaneous vaginal delivery)    x 3    Patient Active Problem List   Diagnosis Date Noted  . Degenerative disc disease, cervical 10/13/2020  . Chronic, continuous use of opioids 09/10/2020  . Rhinitis, chronic 06/10/2020  . Restless legs syndrome (RLS) 01/08/2020  . Osteoporosis 10/10/2019  . COPD gold ?  07/08/2019  . Moderate protein-calorie malnutrition ([place]) 05/31/2018  . Hypertension 06/08/2017  . Left knee pain 03/15/2015  . Acne cystica 12/22/2011  . Healthcare maintenance 07/06/2011  . EPIDERMOID CYST 02/10/2011  . Hyperlipidemia 06/03/2009  . WEIGHT LOSS 06/03/2009  . CARBUNCLE AND FURUNCLE OF FACE 02/04/2009  .  Human immunodeficiency virus (HIV) disease ([place]) 10/29/2008  . ANEMIA-NOS 10/29/2008  . PERIPHERAL NEUROPATHY 10/29/2008  . GERD 10/29/2008  . RENAL INSUFFICIENCY 10/29/2008  . CEREBROVASCULAR ACCIDENT, HX OF 10/29/2008  . TOBACCO DEPENDENCE 02/14/2007  . CVA 02/14/2007  . Allergic rhinitis 02/14/2007  . ACNE 02/14/2007  . Symptoms concerning nutrition, metabolism, and development 02/14/2007    Past Surgical History:  Procedure Laterality Date  . MULTIPLE TOOTH EXTRACTIONS     with sedation  . TUBAL LIGATION    . UPPER GI ENDOSCOPY     normal per patient - "years ago"    OB History    Gravida  3   Para  3   Term  3   Preterm      AB      Living  3     SAB      TAB      Ectopic      Multiple      Live Births  3            Home Medications    Prior to Admission medications   Medication Sig Start Date End Date Taking? Authorizing Provider  albuterol (VENTOLIN HFA) 108 (90 Base) MCG/ACT inhaler Inhale 2 puffs into the lungs every 6 (six) hours as needed for wheezing or shortness of breath.   Yes [provider]  amLODipine (NORVASC) 10 MG tablet Take 1 tablet (10 mg total) by mouth daily. 09/10/20  Yes Christian, Rylee, MD  BIKTARVY 50-200-25 MG TABS tablet TAKE 1 TABLET BY MOUTH DAILY. 06/30/20  Yes Carlyle Basques, MD  budesonide-formoterol (SYMBICORT) 80-4.5 MCG/ACT inhaler TAKE 2 PUFFS FIRST THING IN THE MORNING AND THEN ANOTHER 2 PUFFS 12 HOURS LATER 10/18/20  Yes Tanda Rockers, MD  Ensure (ENSURE) Take 237 mLs by mouth 3 (three) times daily between meals. 06/28/20  Yes Carlyle Basques, MD  levocetirizine (XYZAL) 5 MG tablet Take 5 mg by mouth every evening.   Yes [provider]  megestrol (MEGACE) 40 MG tablet Take 1 tablet (40 mg total) by mouth daily. 09/10/20  Yes Christian, Rylee, MD  MONTELUKAST SODIUM PO Take by mouth.   Yes [provider]  polyethylene glycol (MIRALAX / GLYCOLAX) packet Take one capful in 8 oz of fluid  of your choice twice a day until having soft stools, then back down to once a day.  If not having soft stools with twice a day, increased to three times a day 07/21/15  Yes Linton Flemings, MD  benzonatate (TESSALON) 100 MG capsule Take 1 capsule (100 mg total) by mouth every 8 (eight) hours. 11/13/20   Hall-Potvin, [place], PA-C  calcium citrate-vitamin D (CITRACAL+D) 315-200 MG-UNIT tablet Take 2 tablets by mouth 2 (two) times daily. 10/10/19 10/09/20  Marcelyn Bruins, MD  cetirizine (ZYRTEC ALLERGY) 10 MG tablet Take 1 tablet (10 mg total) by mouth daily. 11/13/20   Hall-Potvin, [place], PA-C  diclofenac sodium (VOLTAREN) 1 % GEL Apply 2 g topically 4 (four) times daily as needed. 10/10/19   Marcelyn Bruins, MD  fluticasone (FLONASE) 50 MCG/ACT nasal spray Place 1 spray into both nostrils daily. 11/13/20   Hall-Potvin, [place], PA-C  hydrocortisone 2.5 % cream Apply rectally 2 times daily 09/07/18   [provider]  Witch Hazel (TUCKS) 50 % PADS Use as needed for rectal itching, after having BM 07/21/15   Linton Flemings, MD  omeprazole (PRILOSEC) 20 MG capsule TAKE 1 CAPSULE BY MOUTH EVERY DAY 08/02/20 11/13/20  Carlyle Basques, MD  pravastatin (PRAVACHOL) 20 MG tablet Take 1 tablet (20 mg total) by mouth daily. 09/10/20 11/13/20  Mitzi Hansen, MD    Family History Family History  Problem Relation Age of Onset  . Hypertension Mother   . Hypertension Father   . Hyperlipidemia Father   . COPD Father   . Atrial fibrillation Father   . Colon cancer Neg Hx   . Rectal cancer Neg Hx   . Stomach cancer Neg Hx     Social History Social History   Tobacco Use  . Smoking status: Former Smoker    Packs/day: 0.40    Years: 38.00    Pack years: 15.20    Types: Cigarettes    Start date: 12/18/2012    Quit date: 11/21/2013    Years since quitting: 6.9  . Smokeless tobacco: Never Used  Vaping Use  . Vaping Use: Never used  Substance Use Topics  . Alcohol use: Not Currently  . Drug  use: No    Comment: Hx - clean since 1998     Allergies   Chantix [varenicline]   Review of Systems Review of Systems  Constitutional: Negative for fatigue and fever.  HENT: Positive for congestion, postnasal drip and rhinorrhea. Negative for dental problem, ear pain, facial swelling, hearing loss, sinus pain, sore throat, trouble swallowing and voice change.   Eyes: Negative  for photophobia, pain and visual disturbance.  Respiratory: Positive for cough and chest tightness. Negative for shortness of breath and wheezing.   Cardiovascular: Negative for chest pain and palpitations.  Gastrointestinal: Negative for diarrhea and vomiting.  Musculoskeletal: Negative for arthralgias and myalgias.  Neurological: Negative for dizziness and headaches.     Physical Exam Triage Vital Signs ED Triage Vitals  Enc Vitals Group     BP      Pulse      Resp      Temp      Temp src      SpO2      Weight      Height      Head Circumference      Peak Flow      Pain Score      Pain Loc      Pain Edu?      Excl. in [place]?    No data found.  Updated Vital Signs BP (!) 154/102 Comment: took HTN med 1 hr ago  Pulse 95   Temp 97.9 F (36.6 C) (Oral)   Resp (!) 22   SpO2 98%   Visual Acuity Right Eye Distance:   Left Eye Distance:   Bilateral Distance:    Right Eye Near:   Left Eye Near:    Bilateral Near:     Physical Exam Constitutional:      General: She is not in acute distress.    Appearance: She is not ill-appearing or diaphoretic.  HENT:     Head: Normocephalic and atraumatic.     Right Ear: Tympanic membrane and ear canal normal.     Left Ear: Tympanic membrane and ear canal normal.     Mouth/Throat:     Mouth: Mucous membranes are moist.     Pharynx: Oropharynx is clear. No oropharyngeal exudate or posterior oropharyngeal erythema.  Eyes:     General: No scleral icterus.    Conjunctiva/sclera: Conjunctivae normal.     Pupils: Pupils are equal, round, and reactive to  light.  Neck:     Comments: Trachea midline, negative JVD Cardiovascular:     Rate and Rhythm: Normal rate and regular rhythm.     Heart sounds: No murmur heard.  No gallop.   Pulmonary:     Effort: Pulmonary effort is normal. No respiratory distress.     Breath sounds: No wheezing, rhonchi or rales.     Comments: Normal breathing rate and effort at bedside Musculoskeletal:     Cervical back: Neck supple. No tenderness.  Lymphadenopathy:     Cervical: No cervical adenopathy.  Skin:    Capillary Refill: Capillary refill takes less than 2 seconds.     Coloration: Skin is not jaundiced or pale.     Findings: No rash.  Neurological:     General: No focal deficit present.     Mental Status: She is alert and oriented to person, place, and time.      UC Treatments / Results  Labs (all labs ordered are listed, but only abnormal results are displayed) Labs Reviewed  NOVEL CORONAVIRUS, NAA    EKG   Radiology No results found.  Procedures Procedures (including critical care time)  Medications Ordered in UC Medications - No data to display  Initial Impression / Assessment and Plan / UC Course  I have reviewed the triage vital signs and the nursing notes.  Pertinent labs & imaging results that were available during my care of the patient were  reviewed by me and considered in my medical decision making (see chart for details).     Patient afebrile, nontoxic, with SpO2 98%.  Covid PCR pending.  Patient to quarantine until results are back.  We will treat supportively as outlined below for likely viral URI.  Low concern for pneumonia, acute bacterial sinusitis, though discussed return precautions thereof.  Return precautions discussed, patient verbalized understanding and is agreeable to plan. Final Clinical Impressions(s) / UC Diagnoses   Final diagnoses:  Encounter for laboratory testing for COVID-19 virus  Viral URI with cough     Discharge Instructions     Tessalon  for cough. Start flonase, atrovent nasal spray for nasal congestion/drainage. You can use over the counter nasal saline rinse such as neti pot for nasal congestion. Keep hydrated, your urine should be clear to pale yellow in color. Tylenol/motrin for fever and pain. Monitor for any worsening of symptoms, chest pain, shortness of breath, wheezing, swelling of the throat, go to the emergency department for further evaluation needed.     ED Prescriptions    Medication Sig Dispense Auth. Provider   fluticasone (FLONASE) 50 MCG/ACT nasal spray Place 1 spray into both nostrils daily. 16 g Hall-Potvin, [place], PA-C   benzonatate (TESSALON) 100 MG capsule Take 1 capsule (100 mg total) by mouth every 8 (eight) hours. 21 capsule Hall-Potvin, [place], PA-C   cetirizine (ZYRTEC ALLERGY) 10 MG tablet Take 1 tablet (10 mg total) by mouth daily. 30 tablet Hall-Potvin, [place], PA-C     PDMP not reviewed this encounter.   Hall-Potvin, [place], PA-C 11/13/20 1440

## 2020-11-14 LAB — NOVEL CORONAVIRUS, NAA: SARS-CoV-2, NAA: NOT DETECTED

## 2020-11-14 LAB — SARS-COV-2, NAA 2 DAY TAT

## 2020-11-15 ENCOUNTER — Emergency Department (HOSPITAL_COMMUNITY)
Admission: EM | Admit: 2020-11-15 | Discharge: 2020-11-15 | Disposition: A | Discharge status: Home / Self Care | Payer: Medicaid Other | Attending: Emergency Medicine | Admitting: Emergency Medicine

## 2020-11-15 ENCOUNTER — Other Ambulatory Visit: Payer: Self-pay

## 2020-11-15 ENCOUNTER — Emergency Department (HOSPITAL_COMMUNITY): Payer: Medicaid Other

## 2020-11-15 ENCOUNTER — Encounter (HOSPITAL_COMMUNITY): Payer: Self-pay

## 2020-11-15 DIAGNOSIS — Z87891 Personal history of nicotine dependence: Secondary | ICD-10-CM | POA: Diagnosis not present

## 2020-11-15 DIAGNOSIS — Z7952 Long term (current) use of systemic steroids: Secondary | ICD-10-CM | POA: Diagnosis not present

## 2020-11-15 DIAGNOSIS — Z20822 Contact with and (suspected) exposure to covid-19: Secondary | ICD-10-CM | POA: Insufficient documentation

## 2020-11-15 DIAGNOSIS — J069 Acute upper respiratory infection, unspecified: Secondary | ICD-10-CM | POA: Diagnosis not present

## 2020-11-15 DIAGNOSIS — J449 Chronic obstructive pulmonary disease, unspecified: Secondary | ICD-10-CM | POA: Insufficient documentation

## 2020-11-15 DIAGNOSIS — R0602 Shortness of breath: Secondary | ICD-10-CM | POA: Insufficient documentation

## 2020-11-15 DIAGNOSIS — Z21 Asymptomatic human immunodeficiency virus [HIV] infection status: Secondary | ICD-10-CM | POA: Insufficient documentation

## 2020-11-15 DIAGNOSIS — B9789 Other viral agents as the cause of diseases classified elsewhere: Secondary | ICD-10-CM | POA: Diagnosis not present

## 2020-11-15 DIAGNOSIS — R5383 Other fatigue: Secondary | ICD-10-CM

## 2020-11-15 DIAGNOSIS — Z743 Need for continuous supervision: Secondary | ICD-10-CM | POA: Diagnosis not present

## 2020-11-15 DIAGNOSIS — Z79899 Other long term (current) drug therapy: Secondary | ICD-10-CM | POA: Diagnosis not present

## 2020-11-15 DIAGNOSIS — H9203 Otalgia, bilateral: Secondary | ICD-10-CM | POA: Diagnosis not present

## 2020-11-15 DIAGNOSIS — I10 Essential (primary) hypertension: Secondary | ICD-10-CM | POA: Diagnosis not present

## 2020-11-15 DIAGNOSIS — R079 Chest pain, unspecified: Secondary | ICD-10-CM | POA: Diagnosis not present

## 2020-11-15 DIAGNOSIS — R059 Cough, unspecified: Secondary | ICD-10-CM | POA: Diagnosis present

## 2020-11-15 LAB — CBC WITH DIFFERENTIAL/PLATELET
Abs Immature Granulocytes: 0.01 10*3/uL (ref 0.00–0.07)
Basophils Absolute: 0 10*3/uL (ref 0.0–0.1)
Basophils Relative: 0 %
Eosinophils Absolute: 0 10*3/uL (ref 0.0–0.5)
Eosinophils Relative: 1 %
HCT: 37.1 % (ref 36.0–46.0)
Hemoglobin: 12.1 g/dL (ref 12.0–15.0)
Immature Granulocytes: 0 %
Lymphocytes Relative: 35 %
Lymphs Abs: 1.7 10*3/uL (ref 0.7–4.0)
MCH: 33.4 pg (ref 26.0–34.0)
MCHC: 32.6 g/dL (ref 30.0–36.0)
MCV: 102.5 fL — ABNORMAL HIGH (ref 80.0–100.0)
Monocytes Absolute: 0.4 10*3/uL (ref 0.1–1.0)
Monocytes Relative: 7 %
Neutro Abs: 2.7 10*3/uL (ref 1.7–7.7)
Neutrophils Relative %: 57 %
Platelets: 281 10*3/uL (ref 150–400)
RBC: 3.62 MIL/uL — ABNORMAL LOW (ref 3.87–5.11)
RDW: 13.7 % (ref 11.5–15.5)
WBC: 4.7 10*3/uL (ref 4.0–10.5)
nRBC: 0 % (ref 0.0–0.2)

## 2020-11-15 LAB — BASIC METABOLIC PANEL
Anion gap: 11 (ref 5–15)
BUN: 11 mg/dL (ref 6–20)
CO2: 20 mmol/L — ABNORMAL LOW (ref 22–32)
Calcium: 9.3 mg/dL (ref 8.9–10.3)
Chloride: 106 mmol/L (ref 98–111)
Creatinine, Ser: 1.05 mg/dL — ABNORMAL HIGH (ref 0.44–1.00)
GFR, Estimated: >60 mL/min (ref >60)
Glucose, Bld: 87 mg/dL (ref 70–99)
Potassium: 4.4 mmol/L (ref 3.5–5.1)
Sodium: 137 mmol/L (ref 135–145)

## 2020-11-15 LAB — RESP PANEL BY RT-PCR (FLU A&B, COVID) ARPGX2
Influenza A by PCR: NEGATIVE
Influenza B by PCR: NEGATIVE
SARS Coronavirus 2 by RT PCR: NEGATIVE

## 2020-11-15 IMAGING — DX DG CHEST 1V PORT
1 series · 1 of 1 positions shown · non-contrast
Comparison: [DATE]

CLINICAL DATA: Cough.

EXAM:
PORTABLE CHEST 1 VIEW

[chest ap]
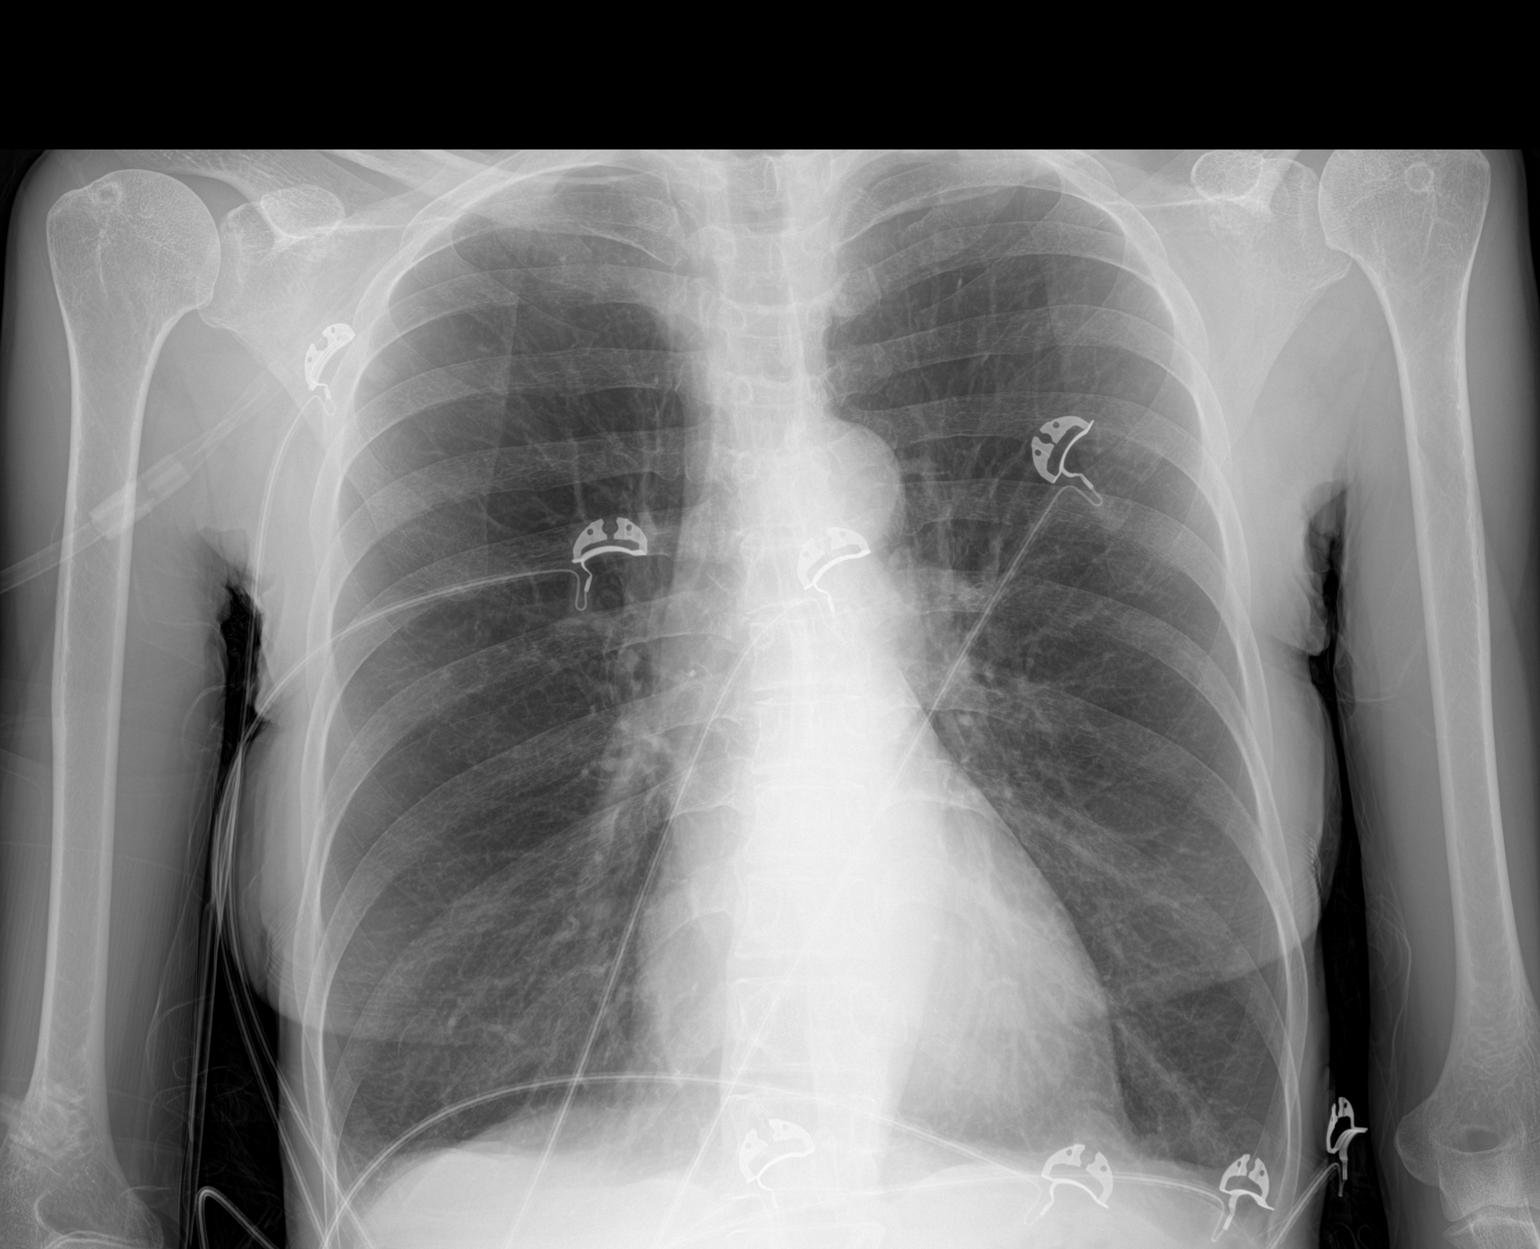

[1 of 1 positions shown; findings below may reference images not displayed]

FINDINGS: The heart size and mediastinal contours are within normal limits.
Both lungs are clear. The visualized skeletal structures are
unremarkable.
IMPRESSION: No active disease.

## 2020-11-15 MED ORDER — SODIUM CHLORIDE 0.9 % IV BOLUS
500.0000 mL | Freq: Once | INTRAVENOUS | Status: AC
  Administered 2020-11-15: 500 mL via INTRAVENOUS

## 2020-11-15 MED ORDER — ACETAMINOPHEN 325 MG PO TABS
650.0000 mg | ORAL_TABLET | Freq: Once | ORAL | Status: AC
  Administered 2020-11-15: 650 mg via ORAL
  Filled 2020-11-15: qty 2

## 2020-11-15 NOTE — ED Notes (Signed)
Patient reported some shortness of breath when she got back from the bathroom well as some blood coming from her anus.

## 2020-11-15 NOTE — Discharge Instructions (Signed)
Please continue treating her symptoms with over-the-counter medications and medications as prescribed by urgent care. It is important you stay hydrated. Follow close with your primary care provider regarding your visit today. Return to emergency department if you develop persistent high fevers, worsening shortness of breath, or other worsening symptoms.

## 2020-11-15 NOTE — ED Notes (Signed)
Patient ambulated in room for approximately 2 minutes on room air and O2 saturation level stayed at 100%.  She then walked to the bathroom.  She said her knees feel weak but does not feel dizzy or shortness of breath.

## 2020-11-15 NOTE — ED Provider Notes (Signed)
[facility] DEPT Provider Note   CSN: [id number] Arrival date & time: 11/15/20  4709     History Chief Complaint  Patient presents with  . Cough  . Shortness of Breath    Janice Williams is a 59 y.o. female with PMHx of COPD, HIV, HTN, CVA, presenting with persistent URI symptoms and generalized fatigue.  Patient states symptoms began on Thursday.  She was evaluated at urgent care on Saturday and tested negative for Covid.  She endorses productive cough, intermittently feeling hot and cold, nasal congestion, bilateral ear pain, and some mild intermittent shortness of breath.  She states this morning she felt more fatigue and has no energy therefore presents for evaluation.  She has been treating symptoms with cold medications and allergy medications with minimal relief.  No known sick contacts.  She is completed Covid 19 vaccinations with booster.  She is also compliant with her antiretrovirals and had undetectable viral load last month.  The history is provided by the patient.       Past Medical History:  Diagnosis Date  . Allergy   . Anemia   . Arthritis    lower back, knees  . Bronchitis   . COPD (chronic obstructive pulmonary disease) ([place])   . COPD with acute exacerbation ([place]) 06/09/2017  . Dental abscess 03/15/2015  . DYSPEPSIA 02/14/2007   Qualifier: Diagnosis of  By: Samara Snide    . EXTERNAL OTITIS 01/06/2010   Qualifier: Diagnosis of  By: Tomma Lightning MD, Claiborne Billings    . FACIAL RASH 02/04/2009   Qualifier: Diagnosis of  By: Rhunette Croft    . Former smoker    quit 2014  . GERD (gastroesophageal reflux disease)   . HIV infection ([place])   . Hypertension   . Stroke ([place])   . 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 SVD (spontaneous vaginal delivery)    x 3    Patient Active Problem List   Diagnosis Date Noted  . Degenerative disc disease, cervical 10/13/2020  . Chronic, continuous use of opioids 09/10/2020  . Rhinitis, chronic  06/10/2020  . Restless legs syndrome (RLS) 01/08/2020  . Osteoporosis 10/10/2019  . COPD gold ?  07/08/2019  . Moderate protein-calorie malnutrition ([place]) 05/31/2018  . Hypertension 06/08/2017  . Left knee pain 03/15/2015  . Acne cystica 12/22/2011  . Healthcare maintenance 07/06/2011  . EPIDERMOID CYST 02/10/2011  . Hyperlipidemia 06/03/2009  . WEIGHT LOSS 06/03/2009  . CARBUNCLE AND FURUNCLE OF FACE 02/04/2009  . Human immunodeficiency virus (HIV) disease ([place]) 10/29/2008  . ANEMIA-NOS 10/29/2008  . PERIPHERAL NEUROPATHY 10/29/2008  . GERD 10/29/2008  . RENAL INSUFFICIENCY 10/29/2008  . CEREBROVASCULAR ACCIDENT, HX OF 10/29/2008  . TOBACCO DEPENDENCE 02/14/2007  . CVA 02/14/2007  . Allergic rhinitis 02/14/2007  . ACNE 02/14/2007  . Symptoms concerning nutrition, metabolism, and development 02/14/2007    Past Surgical History:  Procedure Laterality Date  . MULTIPLE TOOTH EXTRACTIONS     with sedation  . TUBAL LIGATION    . UPPER GI ENDOSCOPY     normal per patient - "years ago"     OB History    Gravida  3   Para  3   Term  3   Preterm      AB      Living  3     SAB      TAB      Ectopic      Multiple  Live Births  3           Family History  Problem Relation Age of Onset  . Hypertension Mother   . Hypertension Father   . Hyperlipidemia Father   . COPD Father   . Atrial fibrillation Father   . Colon cancer Neg Hx   . Rectal cancer Neg Hx   . Stomach cancer Neg Hx     Social History   Tobacco Use  . Smoking status: Former Smoker    Packs/day: 0.40    Years: 38.00    Pack years: 15.20    Types: Cigarettes    Start date: 12/18/2012    Quit date: 11/21/2013    Years since quitting: 6.9  . Smokeless tobacco: Never Used  Vaping Use  . Vaping Use: Never used  Substance Use Topics  . Alcohol use: Not Currently  . Drug use: No    Comment: Hx - clean since [place] Medications Prior to Admission medications   Medication Sig  Start Date End Date Taking? Authorizing Provider  albuterol (VENTOLIN HFA) 108 (90 Base) MCG/ACT inhaler Inhale 2 puffs into the lungs every 6 (six) hours as needed for wheezing or shortness of breath.   Yes [provider]  amLODipine (NORVASC) 10 MG tablet Take 1 tablet (10 mg total) by mouth daily. 09/10/20  Yes Christian, Rylee, MD  benzonatate (TESSALON) 100 MG capsule Take 1 capsule (100 mg total) by mouth every 8 (eight) hours. 11/13/20  Yes Hall-Potvin, [place], PA-C  BIKTARVY 50-200-25 MG TABS tablet TAKE 1 TABLET BY MOUTH DAILY. Patient taking differently: Take 1 tablet by mouth daily.  06/30/20  Yes Carlyle Basques, MD  budesonide-formoterol (SYMBICORT) 80-4.5 MCG/ACT inhaler TAKE 2 PUFFS FIRST THING IN THE MORNING AND THEN ANOTHER 2 PUFFS 12 HOURS LATER Patient taking differently: Inhale 2 puffs into the lungs in the morning and at bedtime.  10/18/20  Yes Tanda Rockers, MD  cetirizine (ZYRTEC ALLERGY) 10 MG tablet Take 1 tablet (10 mg total) by mouth daily. 11/13/20  Yes Hall-Potvin, [place], PA-C  fluticasone (FLONASE) 50 MCG/ACT nasal spray Place 1 spray into both nostrils daily. 11/13/20  Yes Hall-Potvin, [place], PA-C  levocetirizine (XYZAL) 5 MG tablet Take 5 mg by mouth every evening.   Yes [provider]  megestrol (MEGACE) 40 MG tablet Take 1 tablet (40 mg total) by mouth daily. 09/10/20  Yes Christian, Rylee, MD  montelukast (SINGULAIR) 10 MG tablet Take 10 mg by mouth daily. 10/05/20  Yes [provider]  omeprazole (PRILOSEC) 20 MG capsule Take 20 mg by mouth daily.   Yes [provider]  polyethylene glycol (MIRALAX / GLYCOLAX) packet Take one capful in 8 oz of fluid of your choice twice a day until having soft stools, then back down to once a day.  If not having soft stools with twice a day, increased to three times a day Patient taking differently: Take 17 g by mouth 2 (two) times daily.  07/21/15  Yes Linton Flemings, MD  pravastatin  (PRAVACHOL) 20 MG tablet Take 20 mg by mouth daily.   Yes [provider]  PRESCRIPTION MEDICATION Inject 1 Dose into the skin once a week. Allergy shot in office on Tuesdays   Yes [provider]  calcium citrate-vitamin D (CITRACAL+D) 315-200 MG-UNIT tablet Take 2 tablets by mouth 2 (two) times daily. Patient not taking: Reported on 11/15/2020 10/10/19 10/09/20  Marcelyn Bruins, MD  Ensure (ENSURE) Take 237 mLs by  mouth 3 (three) times daily between meals. Patient not taking: Reported on 11/15/2020 06/28/20   Carlyle Basques, MD    Allergies    Chantix [varenicline]  Review of Systems   Review of Systems  Constitutional: Positive for appetite change and fatigue.  HENT: Positive for congestion and ear pain. Negative for sore throat.   Respiratory: Positive for cough and shortness of breath.   Gastrointestinal: Negative for abdominal pain.  All other systems reviewed and are negative.   Physical Exam Updated Vital Signs BP (!) 150/93   Pulse 91   Temp 98.4 F (36.9 C)   Resp 15   Ht 5\' 3"  (1.6 m)   Wt 46.7 kg   SpO2 99%   BMI 18.25 kg/m   Physical Exam Vitals and nursing note reviewed.  Constitutional:      General: She is not in acute distress.    Appearance: She is well-developed.  HENT:     Head: Normocephalic and atraumatic.     Right Ear: Tympanic membrane and ear canal normal.     Left Ear: Tympanic membrane and ear canal normal.     Mouth/Throat:     Mouth: Mucous membranes are moist.     Pharynx: Oropharynx is clear.  Eyes:     Conjunctiva/sclera: Conjunctivae normal.  Cardiovascular:     Rate and Rhythm: Normal rate and regular rhythm.  Pulmonary:     Effort: Pulmonary effort is normal. No tachypnea, accessory muscle usage or respiratory distress.     Comments: Scattered rhonchi on left Abdominal:     General: Bowel sounds are normal.     Palpations: Abdomen is soft.     Tenderness: There is no abdominal tenderness.    Musculoskeletal:     Cervical back: Normal range of motion and neck supple.  Skin:    General: Skin is warm.  Neurological:     Mental Status: She is alert.  Psychiatric:        Behavior: Behavior normal.     ED Results / Procedures / Treatments   Labs (all labs ordered are listed, but only abnormal results are displayed) Labs Reviewed  BASIC METABOLIC PANEL - Abnormal; Notable for the following components:      Result Value   CO2 20 (*)    Creatinine, Ser 1.05 (*)    All other components within normal limits  CBC WITH DIFFERENTIAL/PLATELET - Abnormal; Notable for the following components:   RBC 3.62 (*)    MCV 102.5 (*)    All other components within normal limits  RESP PANEL BY RT-PCR (FLU A&B, COVID) ARPGX2    EKG None  Radiology DG Chest Port 1 View  Result Date: 11/15/2020 CLINICAL DATA:  Cough. EXAM: PORTABLE CHEST 1 VIEW COMPARISON:  September 14, 2020 FINDINGS: The heart size and mediastinal contours are within normal limits. Both lungs are clear. The visualized skeletal structures are unremarkable. IMPRESSION: No active disease. Electronically Signed   By: Marijo Conception M.D.   On: 11/15/2020 10:45    Procedures Procedures (including critical care time)  Medications Ordered in ED Medications  acetaminophen (TYLENOL) tablet 650 mg (650 mg Oral Given 11/15/20 1032)  sodium chloride 0.9 % bolus 500 mL (0 mLs Intravenous Stopped 11/15/20 1445)    ED Course  I have reviewed the triage vital signs and the nursing notes.  Pertinent labs & imaging results that were available during my care of the patient were reviewed by me and considered in my medical decision  making (see chart for details).    LASHAWN BROMWELL was evaluated in Emergency Department on 11/15/2020 for the symptoms described in the history of present illness. She was evaluated in the context of the global COVID-19 pandemic, which necessitated consideration that the patient might be at risk for  infection with the SARS-CoV-2 virus that causes COVID-19. Institutional protocols and algorithms that pertain to the evaluation of patients at risk for COVID-19 are in a state of rapid change based on information released by regulatory bodies including the CDC and federal and state organizations. These policies and algorithms were followed during the patient's care in the ED.  MDM Rules/Calculators/A&P                          Patient presenting for persistent symptoms related to upper respiratory infection.  Symptoms started on Thursday, include productive cough, nasal congestion productive of clear mucus, generalized fatigue, intermittent shortness of breath.  She is treating with antihistamines, nasal sprays, cold medications, and cough medications with minimal relief.  On exam, she is afebrile in no distress.  Lungs with scattered rhonchi though normal work of breathing and speaking in full sentences.  O2 saturation 100% on room air.  Slightly hypertensive though otherwise vital signs are stable.  She is treated with Tylenol and small IV fluid bolus.  Chest x-ray is negative.  Labs are reassuring, no leukocytosis or acute electrolyte derangements.  Flu swab and Covid swab here, negative.  Patient ambulated here and maintained O2 saturation 100% on room air with normal work of breathing.  Presentation is not consistent with acute bacterial infection at this time, discussed antibiotics are not indicated however she is encouraged to follow closely with PCP should symptoms worsen or persist patient is discharged in no distress, agreeable to plan and appropriate discharge.  Discussed results, findings, treatment and follow up. Patient advised of return precautions. Patient verbalized understanding and agreed with plan.  Final Clinical Impression(s) / ED Diagnoses Final diagnoses:  Viral URI with cough  Other fatigue    Rx / DC Orders ED Discharge Orders    None       Victoriana Aziz, [place] N,  PA-C 11/15/20 [place], Ankit, MD 11/19/20 1652

## 2020-11-15 NOTE — ED Triage Notes (Addendum)
Patient woke up this am with shob, headache, and productive cough, green sputum. Pt states wednesday went to Urgent care and was prescribed cough and allergy medicine for URI. Patient states she was covid -.

## 2020-11-16 ENCOUNTER — Other Ambulatory Visit: Payer: Self-pay

## 2020-11-16 ENCOUNTER — Encounter: Payer: Self-pay | Admitting: Internal Medicine

## 2020-11-16 ENCOUNTER — Ambulatory Visit (INDEPENDENT_AMBULATORY_CARE_PROVIDER_SITE_OTHER): Payer: Medicaid Other | Admitting: Internal Medicine

## 2020-11-16 ENCOUNTER — Telehealth: Payer: Self-pay | Admitting: *Deleted

## 2020-11-16 VITALS — BP 141/99 | HR 106 | Temp 97.8°F | Ht 63.0 in | Wt 97.2 lb

## 2020-11-16 DIAGNOSIS — K219 Gastro-esophageal reflux disease without esophagitis: Secondary | ICD-10-CM

## 2020-11-16 DIAGNOSIS — J0100 Acute maxillary sinusitis, unspecified: Secondary | ICD-10-CM

## 2020-11-16 MED ORDER — OMEPRAZOLE 20 MG PO CPDR: 20.0000 mg | DELAYED_RELEASE_CAPSULE | Freq: Every day | ORAL | 5 refills | Status: DC

## 2020-11-16 MED ORDER — AMOXICILLIN-POT CLAVULANATE 875-125 MG PO TABS: 1.0000 | ORAL_TABLET | Freq: Two times a day (BID) | ORAL | 0 refills | Status: AC

## 2020-11-16 NOTE — Progress Notes (Signed)
Acute Office Visit  Subjective:    Patient ID: Janice Williams, female    DOB: 08/10/1961, 59 y.o.   MRN: [id number]  Chief Complaint  Patient presents with  . Sinusitis  . Cough    Green mucous     HPI Janice Williams is a 59 y/o female with PMHx of COPD, HIV, HTN, CVA, presenting with 2 weeks Hx of congestion, headache, sinus pain. Please refer to problem based charting for further details and assessment and plan.   Past Medical History:  Diagnosis Date  . Allergy   . Anemia   . Arthritis    lower back, knees  . Bronchitis   . COPD (chronic obstructive pulmonary disease) ([place])   . COPD with acute exacerbation ([place]) 06/09/2017  . Dental abscess 03/15/2015  . DYSPEPSIA 02/14/2007   Qualifier: Diagnosis of  By: Samara Snide    . EXTERNAL OTITIS 01/06/2010   Qualifier: Diagnosis of  By: Tomma Lightning MD, Claiborne Billings    . FACIAL RASH 02/04/2009   Qualifier: Diagnosis of  By: Rhunette Croft    . Former smoker    quit 2014  . GERD (gastroesophageal reflux disease)   . HIV infection ([place])   . Hypertension   . Stroke ([place])   . Substance abuse ([place])    history, clean 7 years  . SVD (spontaneous vaginal delivery)    x 3    Past Surgical History:  Procedure Laterality Date  . MULTIPLE TOOTH EXTRACTIONS     with sedation  . TUBAL LIGATION    . UPPER GI ENDOSCOPY     normal per patient - "years ago"    Family History  Problem Relation Age of Onset  . Hypertension Mother   . Hypertension Father   . Hyperlipidemia Father   . COPD Father   . Atrial fibrillation Father   . Colon cancer Neg Hx   . Rectal cancer Neg Hx   . Stomach cancer Neg Hx     Social History   Socioeconomic History  . Marital status: Single    Spouse name: Not on file  . Number of children: 3  . Years of education: Not on file  . Highest education level: Not on file  Occupational History  . Not on file  Tobacco Use  . Smoking status: Former Smoker    Packs/day: 0.40    Years: 38.00    Pack years: 15.20     Types: Cigarettes    Start date: 12/18/2012    Quit date: 11/21/2013    Years since quitting: 6.9  . Smokeless tobacco: Never Used  Vaping Use  . Vaping Use: Never used  Substance and Sexual Activity  . Alcohol use: Not Currently  . 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  . Sexual activity: Not Currently    Partners: Male  Other Topics Concern  . Not on file  Social History Narrative  . Not on file   Social Determinants of Health   Financial Resource Strain:   . Difficulty of Paying Living Expenses: Not on file  Food Insecurity:   . Worried About Charity fundraiser in the Last Year: Not on file  . Ran Out of Food in the Last Year: Not on file  Transportation Needs:   . Lack of Transportation (Medical): Not on file  . Lack of Transportation (Non-Medical): Not on file  Physical Activity:   . Days of Exercise per Week: Not on file  .  Minutes of Exercise per Session: Not on file  Stress:   . Feeling of Stress : Not on file  Social Connections:   . Frequency of Communication with Friends and Family: Not on file  . Frequency of Social Gatherings with Friends and Family: Not on file  . Attends Religious Services: Not on file  . Active Member of Clubs or Organizations: Not on file  . Attends Archivist Meetings: Not on file  . Marital Status: Not on file  Intimate Partner Violence:   . Fear of Current or Ex-Partner: Not on file  . Emotionally Abused: Not on file  . Physically Abused: Not on file  . Sexually Abused: Not on file    Allergies  Allergen Reactions  . Chantix [Varenicline] Itching    Review of Systems     - for fever + for chills + for post nasal drainage + for sinus pain + for weakness + cough - for SOB  Objective:    Physical Exam  BP (!) 141/99 (BP Location: Left Arm, Patient Position: Sitting, Cuff Size: Normal)   Pulse (!) 106   Temp 97.8 F (36.6 C) (Oral)   Ht 5\' 3"  (1.6 m)   Wt 97 lb 3.2 oz (44.1 kg)   SpO2 100%    BMI 17.22 kg/m  Wt Readings from Last 3 Encounters:  11/16/20 97 lb 3.2 oz (44.1 kg)  11/15/20 103 lb (46.7 kg)  09/22/20 99 lb 4.8 oz (45 kg)   Constitutional: Well-developed and well-nourished. No acute distress.  HENT:  Tenderness of maxillary and frontal sinuses, Oropharynx is clear Cardiovascular: Tachycardic, no murmur Respiratory: Effort normal and breath sounds normal. No respiratory distress. Has rhonchi, No wheezes. There is no tenderness.  Neurological: Is alert and oriented x 3 Skin: Not diaphoretic. No erythema.  Psychiatric: Normal mood and affect. Behavior is normal. Judgment and thought content normal.   Lab Results  Component Value Date   TSH 1.01 01/25/2017   Lab Results  Component Value Date   WBC 4.7 11/15/2020   HGB 12.1 11/15/2020   HCT 37.1 11/15/2020   MCV 102.5 (H) 11/15/2020   PLT 281 11/15/2020   Lab Results  Component Value Date   NA 137 11/15/2020   K 4.4 11/15/2020   CO2 20 (L) 11/15/2020   GLUCOSE 87 11/15/2020   BUN 11 11/15/2020   CREATININE 1.05 (H) 11/15/2020   BILITOT 0.5 09/22/2020   ALKPHOS 47 06/27/2017   AST 15 09/22/2020   ALT 6 09/22/2020   PROT 7.0 09/22/2020   ALBUMIN 4.1 06/27/2017   CALCIUM 9.3 11/15/2020   ANIONGAP 11 11/15/2020   Lab Results  Component Value Date   CHOL 293 (H) 08/27/2019   Lab Results  Component Value Date   HDL 105 08/27/2019   Lab Results  Component Value Date   LDLCALC 172 (H) 08/27/2019   Lab Results  Component Value Date   TRIG 65 08/27/2019   Lab Results  Component Value Date   CHOLHDL 2.8 08/27/2019   No results found for: HGBA1C     Assessment & Plan:   Problem List Items Addressed This Visit      Respiratory   Acute maxillary sinusitis - Primary    Patient reports 2 weeks Hx of congestion, sinus pain, headache, generalized weakness. She had some chills but no fever. No sore throat. She reports occasional cough mostly after having post nasal drainage. No sick contact  and she is vaccinated against COVID. She  was seen in ED yesterday for these symptoms where she had negative flu test, negative COVID test, unremarkable CXR and no leukocytosis on CBC.  On exam, she has tenderness on maxillary sinus area.  Her presentation is suggestive of Sinusitis. Given her symptoms lasted for 2 weeks, it is reasonable to prescribe Abx for probable bacterial sinusitis. (She also has HIV). Encouraged patient to use Flonase (has it at home), perform sinus irrigation and Tylenol and NSAID as needed  -Augmentin 857-125 mg, BID x 7 days -Flonase PRN -Sinus irrigation -PRN Tylenol and NSAID -F/u in clinic if no improvement in 1 week      Relevant Medications   amoxicillin-clavulanate (AUGMENTIN) 875-125 MG tablet     Digestive   GERD    Sent refill for Omeprazole today.      Relevant Medications   omeprazole (PRILOSEC) 20 MG capsule       Meds ordered this encounter  Medications  . amoxicillin-clavulanate (AUGMENTIN) 875-125 MG tablet    Sig: Take 1 tablet by mouth 2 (two) times daily for 7 days.    Dispense:  14 tablet    Refill:  0  . omeprazole (PRILOSEC) 20 MG capsule    Sig: Take 1 capsule (20 mg total) by mouth daily.    Dispense:  90 capsule    Refill:  5   Patient discussed with dr. Evette Doffing.  Dewayne Hatch, MD

## 2020-11-16 NOTE — Patient Instructions (Addendum)
Thank you for allowing [place] to provide your care today. Today we discussed your sinus pain and congestion. I prescribed antibiotic for you.  1- Please take Augmentin 1 tablet every 12 hours 2- Use flonase for congestion 3- You can take over the counter Tylenol or Ibuprofen up to 3-4 times a day as needed for headache 4-Make sure to do sinus irrigation few times a day 5-I sent refill for Omeprazole per your request  Please follow-up in clinic if no improvement in 1 week  Should you have any questions or concerns please call the internal medicine clinic at [phone].

## 2020-11-16 NOTE — Assessment & Plan Note (Signed)
Sent refill for Omeprazole today.

## 2020-11-16 NOTE — Telephone Encounter (Signed)
She told me about the "shot". I can not see the name of that medication. I do not think there will be a problem with getting Abx and any allergy medication and she can start antibiotic. But as I can not see what infusion she get, I recommend her to talk to the allergist who prescribed that medication before next dose.

## 2020-11-16 NOTE — Telephone Encounter (Signed)
Call from pt stating she takes allergy shots at the [facility]. She wants to know if it's ok to take the shots along with the antibiotic ordered today?

## 2020-11-16 NOTE — Assessment & Plan Note (Signed)
Patient reports 2 weeks Hx of congestion, sinus pain, headache, generalized weakness. She had some chills but no fever. No sore throat. She reports occasional cough mostly after having post nasal drainage. No sick contact and she is vaccinated against COVID. She was seen in ED yesterday for these symptoms where she had negative flu test, negative COVID test, unremarkable CXR and no leukocytosis on CBC.  On exam, she has tenderness on maxillary sinus area.  Her presentation is suggestive of Sinusitis. Given her symptoms lasted for 2 weeks, it is reasonable to prescribe Abx for probable bacterial sinusitis. (She also has HIV). Encouraged patient to use Flonase (has it at home), perform sinus irrigation and Tylenol and NSAID as needed  -Augmentin 857-125 mg, BID x 7 days -Flonase PRN -Sinus irrigation -PRN Tylenol and NSAID -F/u in clinic if no improvement in 1 week

## 2020-11-17 DIAGNOSIS — Z419 Encounter for procedure for purposes other than remedying health state, unspecified: Secondary | ICD-10-CM | POA: Diagnosis not present

## 2020-11-17 NOTE — Telephone Encounter (Signed)
TC to patient and she was informed of Dr. Darcey Nora instructions below, she verbalized understanding. SChaplin, RN,BSN

## 2020-11-17 NOTE — Progress Notes (Signed)
Internal Medicine Clinic Attending  Case discussed with Dr. Masoudi  At the time of the visit.  We reviewed the resident's history and exam and pertinent patient test results.  I agree with the assessment, diagnosis, and plan of care documented in the resident's note.  

## 2020-11-19 ENCOUNTER — Other Ambulatory Visit: Payer: Self-pay | Admitting: Internal Medicine

## 2020-11-19 DIAGNOSIS — Z1231 Encounter for screening mammogram for malignant neoplasm of breast: Secondary | ICD-10-CM

## 2020-11-26 DIAGNOSIS — J301 Allergic rhinitis due to pollen: Secondary | ICD-10-CM | POA: Diagnosis not present

## 2020-11-26 DIAGNOSIS — J3089 Other allergic rhinitis: Secondary | ICD-10-CM | POA: Diagnosis not present

## 2020-11-26 DIAGNOSIS — Z7689 Persons encountering health services in other specified circumstances: Secondary | ICD-10-CM | POA: Diagnosis not present

## 2020-11-26 DIAGNOSIS — J3081 Allergic rhinitis due to animal (cat) (dog) hair and dander: Secondary | ICD-10-CM | POA: Diagnosis not present

## 2020-11-29 ENCOUNTER — Other Ambulatory Visit: Payer: Self-pay

## 2020-11-29 DIAGNOSIS — B20 Human immunodeficiency virus [HIV] disease: Secondary | ICD-10-CM

## 2020-11-29 MED ORDER — MEGESTROL ACETATE 40 MG PO TABS: 40.0000 mg | ORAL_TABLET | Freq: Every day | ORAL | 1 refills | Status: DC

## 2020-11-29 NOTE — Telephone Encounter (Signed)
megestrol (MEGACE) 40 MG tablet, REFILL REQUEST @  [facility], [place] AT [address] Phone:  [phone]  Fax:  [phone]

## 2020-11-30 DIAGNOSIS — Z7689 Persons encountering health services in other specified circumstances: Secondary | ICD-10-CM | POA: Diagnosis not present

## 2020-12-03 DIAGNOSIS — Z7689 Persons encountering health services in other specified circumstances: Secondary | ICD-10-CM | POA: Diagnosis not present

## 2020-12-03 DIAGNOSIS — J3089 Other allergic rhinitis: Secondary | ICD-10-CM | POA: Diagnosis not present

## 2020-12-03 DIAGNOSIS — J301 Allergic rhinitis due to pollen: Secondary | ICD-10-CM | POA: Diagnosis not present

## 2020-12-07 DIAGNOSIS — J3089 Other allergic rhinitis: Secondary | ICD-10-CM | POA: Diagnosis not present

## 2020-12-07 DIAGNOSIS — Z7689 Persons encountering health services in other specified circumstances: Secondary | ICD-10-CM | POA: Diagnosis not present

## 2020-12-07 DIAGNOSIS — J301 Allergic rhinitis due to pollen: Secondary | ICD-10-CM | POA: Diagnosis not present

## 2020-12-08 ENCOUNTER — Other Ambulatory Visit (HOSPITAL_COMMUNITY): Payer: Self-pay | Admitting: Internal Medicine

## 2020-12-08 ENCOUNTER — Other Ambulatory Visit: Payer: Self-pay | Admitting: Internal Medicine

## 2020-12-08 DIAGNOSIS — B20 Human immunodeficiency virus [HIV] disease: Secondary | ICD-10-CM

## 2020-12-11 ENCOUNTER — Other Ambulatory Visit: Payer: Self-pay | Admitting: Internal Medicine

## 2020-12-14 DIAGNOSIS — J301 Allergic rhinitis due to pollen: Secondary | ICD-10-CM | POA: Diagnosis not present

## 2020-12-14 DIAGNOSIS — J3089 Other allergic rhinitis: Secondary | ICD-10-CM | POA: Diagnosis not present

## 2020-12-14 DIAGNOSIS — Z7689 Persons encountering health services in other specified circumstances: Secondary | ICD-10-CM | POA: Diagnosis not present

## 2020-12-14 MED FILL — BIKTARVY 50-200-25 MG TABS: 50-200-25 | 30 days supply | Qty: 30 | Fill #0

## 2020-12-18 DIAGNOSIS — Z419 Encounter for procedure for purposes other than remedying health state, unspecified: Secondary | ICD-10-CM | POA: Diagnosis not present

## 2020-12-20 DIAGNOSIS — M1990 Unspecified osteoarthritis, unspecified site: Secondary | ICD-10-CM | POA: Diagnosis not present

## 2020-12-21 DIAGNOSIS — J3089 Other allergic rhinitis: Secondary | ICD-10-CM | POA: Diagnosis not present

## 2020-12-21 DIAGNOSIS — Z7689 Persons encountering health services in other specified circumstances: Secondary | ICD-10-CM | POA: Diagnosis not present

## 2020-12-21 DIAGNOSIS — J301 Allergic rhinitis due to pollen: Secondary | ICD-10-CM | POA: Diagnosis not present

## 2020-12-21 DIAGNOSIS — M1990 Unspecified osteoarthritis, unspecified site: Secondary | ICD-10-CM | POA: Diagnosis not present

## 2020-12-22 DIAGNOSIS — M1990 Unspecified osteoarthritis, unspecified site: Secondary | ICD-10-CM | POA: Diagnosis not present

## 2020-12-23 DIAGNOSIS — M1990 Unspecified osteoarthritis, unspecified site: Secondary | ICD-10-CM | POA: Diagnosis not present

## 2020-12-24 DIAGNOSIS — M1990 Unspecified osteoarthritis, unspecified site: Secondary | ICD-10-CM | POA: Diagnosis not present

## 2020-12-25 DIAGNOSIS — M1990 Unspecified osteoarthritis, unspecified site: Secondary | ICD-10-CM | POA: Diagnosis not present

## 2020-12-26 DIAGNOSIS — M1990 Unspecified osteoarthritis, unspecified site: Secondary | ICD-10-CM | POA: Diagnosis not present

## 2020-12-27 DIAGNOSIS — M1990 Unspecified osteoarthritis, unspecified site: Secondary | ICD-10-CM | POA: Diagnosis not present

## 2020-12-28 ENCOUNTER — Other Ambulatory Visit: Payer: Self-pay

## 2020-12-28 DIAGNOSIS — M1990 Unspecified osteoarthritis, unspecified site: Secondary | ICD-10-CM | POA: Diagnosis not present

## 2020-12-28 DIAGNOSIS — Z7689 Persons encountering health services in other specified circumstances: Secondary | ICD-10-CM | POA: Diagnosis not present

## 2020-12-28 DIAGNOSIS — J3081 Allergic rhinitis due to animal (cat) (dog) hair and dander: Secondary | ICD-10-CM | POA: Diagnosis not present

## 2020-12-28 DIAGNOSIS — J301 Allergic rhinitis due to pollen: Secondary | ICD-10-CM | POA: Diagnosis not present

## 2020-12-28 MED ORDER — TRAMADOL HCL 50 MG PO TABS
50.0000 mg | ORAL_TABLET | Freq: Two times a day (BID) | ORAL | 0 refills | Status: DC | PRN
Start: 2020-12-28 — End: 2021-01-27

## 2020-12-28 NOTE — Telephone Encounter (Signed)
Pt is requesting her tramadol but I dont see it on her med list  Can you please give her a call back

## 2020-12-28 NOTE — Telephone Encounter (Signed)
Returned call to patient. States she needs a refill on tramadol for arthritis pain, knees, back, "all over my body." L. Keyton Bhat, BSN, RN-BC

## 2020-12-28 NOTE — Telephone Encounter (Signed)
Upon chart review, it appears she has a pain contract with [place]. I will send in medication, but can we schedule her for appt with PCP next month?   Thanks!

## 2020-12-29 DIAGNOSIS — M1990 Unspecified osteoarthritis, unspecified site: Secondary | ICD-10-CM | POA: Diagnosis not present

## 2020-12-30 DIAGNOSIS — M1990 Unspecified osteoarthritis, unspecified site: Secondary | ICD-10-CM | POA: Diagnosis not present

## 2020-12-31 DIAGNOSIS — M1990 Unspecified osteoarthritis, unspecified site: Secondary | ICD-10-CM | POA: Diagnosis not present

## 2021-01-01 DIAGNOSIS — M1990 Unspecified osteoarthritis, unspecified site: Secondary | ICD-10-CM | POA: Diagnosis not present

## 2021-01-03 ENCOUNTER — Other Ambulatory Visit: Payer: Self-pay

## 2021-01-03 ENCOUNTER — Other Ambulatory Visit: Payer: Self-pay | Admitting: *Deleted

## 2021-01-03 NOTE — Patient Instructions (Signed)
Visit Information  Janice Williams was given information about Medicaid Managed Care team care coordination services as a part of their [facility] Medicaid benefit. Janice Williams verbally consented to engagement with the [facility] Managed Care team.   For questions related to your [facility] health plan, please call: [phone]  If you would like to schedule transportation through your [facility] plan, please call the following number at least 2 days in advance of your appointment: [phone]  Janice Williams - following are the goals we discussed in your visit today:  Goals Addressed            This Visit's Progress   . Learn and Do Breathing Exercises-COPD       Timeframe:  Long-Range Goal Priority:  Medium Start Date:    01/03/21                         Expected End Date: 03/04/21                 - attend all scheduled appointments - take all medication as prescribed - contact your provider with any questions, concerns or changes in symptoms - do breathing exercises every day - do exercises in a comfortable position that makes breathing as easy as possible    Why is this important?    Breathing exercises can help lessen the cough that comes with chronic obstructive pulmonary disease.   Doing the exercises will give you more energy.   They will also help you to control your symptoms.            Please see education materials related to COPD provided by MyChart link.   Pursed Lip Breathing    Breathe IN through nose and OUT through pursed lips (as if blowing out a candle). It should take twice as long to blow the air out as it does to breathe in.   Copyright  VHI. All rights reserved.     Chronic Obstructive Pulmonary Disease  Chronic obstructive pulmonary disease (COPD) is a long-term (chronic) lung problem. When you have COPD, it is hard for air to get in and out of your lungs. Usually the condition gets worse over time, and your lungs will never return  to normal. There are things you can do to keep yourself as healthy as possible. What are the causes?  Smoking. This is the most common cause.  Certain genes passed from parent to child (inherited). What increases the risk?  Being exposed to secondhand smoke from cigarettes, pipes, or cigars.  Being exposed to chemicals and other irritants, such as fumes and dust in the work environment.  Having chronic lung conditions or infections. What are the signs or symptoms?  Shortness of breath, especially during physical activity.  A long-term cough with a large amount of thick mucus. Sometimes, the cough may not have any mucus (dry cough).  Wheezing.  Breathing quickly.  Skin that looks gray or blue, especially in the fingers, toes, or lips.  Feeling tired (fatigue).  Weight loss.  Chest tightness.  Having infections often.  Episodes when breathing symptoms become much worse (exacerbations). At the later stages of this disease, you may have swelling in the ankles, feet, or legs. How is this treated?  Taking medicines.  Quitting smoking, if you smoke.  Rehabilitation. This includes steps to make your body work better. It may involve a team of specialists.  Doing exercises.  Making changes to your diet.  Using oxygen.  Lung surgery.  Lung transplant.  Comfort measures (palliative care). Follow these instructions at home: Medicines  Take over-the-counter and prescription medicines only as told by your doctor.  Talk to your doctor before taking any cough or allergy medicines. You may need to avoid medicines that cause your lungs to be dry. Lifestyle  If you smoke, stop smoking. Smoking makes the problem worse.  Do not smoke or use any products that contain nicotine or tobacco. If you need help quitting, ask your doctor.  Avoid being around things that make your breathing worse. This may include smoke, chemicals, and fumes.  Stay active, but remember to rest as  well.  Learn and use tips on how to manage stress and control your breathing.  Make sure you get enough sleep. Most adults need at least 7 hours of sleep every night.  Eat healthy foods. Eat smaller meals more often. Rest before meals. Controlled breathing Learn and use tips on how to control your breathing as told by your doctor. Try:  Breathing in (inhaling) through your nose for 1 second. Then, pucker your lips and breath out (exhale) through your lips for 2 seconds.  Putting one hand on your belly (abdomen). Breathe in slowly through your nose for 1 second. Your hand on your belly should move out. Pucker your lips and breathe out slowly through your lips. Your hand on your belly should move in as you breathe out.   Controlled coughing Learn and use controlled coughing to clear mucus from your lungs. Follow these steps: 1. Lean your head a little forward. 2. Breathe in deeply. 3. Try to hold your breath for 3 seconds. 4. Keep your mouth slightly open while coughing 2 times. 5. Spit any mucus out into a tissue. 6. Rest and do the steps again 1 or 2 times as needed. General instructions  Make sure you get all the shots (vaccines) that your doctor recommends. Ask your doctor about a flu shot and a pneumonia shot.  Use oxygen therapy and pulmonary rehabilitation if told by your doctor. If you need home oxygen therapy, ask your doctor if you should buy a tool to measure your oxygen level (oximeter).  Make a COPD action plan with your doctor. This helps you to know what to do if you feel worse than usual.  Manage any other conditions you have as told by your doctor.  Avoid going outside when it is very hot, cold, or humid.  Avoid people who have a sickness you can catch (contagious).  Keep all follow-up visits. Contact a doctor if:  You cough up more mucus than usual.  There is a change in the color or thickness of the mucus.  It is harder to breathe than usual.  Your  breathing is faster than usual.  You have trouble sleeping.  You need to use your medicines more often than usual.  You have trouble doing your normal activities such as getting dressed or walking around the house. Get help right away if:  You have shortness of breath while resting.  You have shortness of breath that stops you from: ? Being able to talk. ? Doing normal activities.  Your chest hurts for longer than 5 minutes.  Your skin color is more blue than usual.  Your pulse oximeter shows that you have low oxygen for longer than 5 minutes.  You have a fever.  You feel too tired to breathe normally. These symptoms may represent a serious problem that  is an emergency. Do not wait to see if the symptoms will go away. Get medical help right away. Call your local emergency services (911 in the [place] Do not drive yourself to the hospital. Summary  Chronic obstructive pulmonary disease (COPD) is a long-term lung problem.  The way your lungs work will never return to normal. Usually the condition gets worse over time. There are things you can do to keep yourself as healthy as possible.  Take over-the-counter and prescription medicines only as told by your doctor.  If you smoke, stop. Smoking makes the problem worse. This information is not intended to replace advice given to you by your health care provider. Make sure you discuss any questions you have with your health care provider. Document Revised: 10/12/2020 Document Reviewed: 10/12/2020 Elsevier Patient Education  2021 [employer].   Patient verbalizes understanding of instructions provided today.   The Managed Medicaid care management team will reach out to the patient again over the next 60 days.   Melissa Montane, RN  Following is a copy of your plan of care:  Patient Care Plan: COPD (Adult)    Problem Identified: Disease Progression (COPD)     Goal: Disease Progression Minimized or Managed   Start Date:  01/03/2021  Expected End Date: 03/04/2021  This Visit's Progress: On track  Priority: Medium  Note:   Current Barriers:  . Chronic Disease Management support and education needs related to COPD-Patient feels that she is managing her health at home very well. She reports needing a raised toilet for ease and mobility. 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 Ms Williams has a [place] aide 7 days a week for a couple of hours a day to assistance with bathing, light house duties and shopping.  Nurse Case Manager Clinical Goal(s):  [name] Over the next 60 days, patient will verbalize understanding of plan for managing COPD . Over the next 60 days, patient will attend all scheduled medical appointments . Over the next 30 days, patient will work with CM team pharmacist to review medications . Over the next 60 days, the patient will demonstrate ongoing self health care management ability as evidenced by taking medications as prescribed, notifying provider with any concerns or changes in symptoms, work on breathing exercises daily  Interventions:  . Inter-disciplinary care team collaboration (see longitudinal plan of care) . Evaluation of current treatment plan related to COPD and patient's adherence to plan as established by provider. Nash Dimmer with PCP regarding patient requested DME . Discussed plans with patient for ongoing care management follow up and provided patient with direct contact information for care management team . Provided patient with mychart educational materials related to breathing exercises . Pharmacy referral for medication review  Patient Goals/Self-Care Activities Over the next 60 days, patient will: - attend all scheduled appointments - take all medication as prescribed - contact your provider with any questions, concerns or changes in symptoms - do breathing exercises every day - do exercises in a comfortable position that makes  breathing as easy as possible    Follow Up Plan: Telephone follow up appointment with Managed Medicaid care management team member scheduled for:03/04/21 @ 1:30pm

## 2021-01-03 NOTE — Patient Outreach (Signed)
Medicaid Managed Care   Nurse Care Manager Note  01/03/2021 Name:  Janice Williams MRN:  [id number] DOB:  July 24, 1961  Janice Williams is an 60 y.o. year old female who is a primary patient of Madalyn Rob, MD.  The [facility] Managed Care Coordination team was consulted for assistance with:    COPD  Ms. Guedes was given information about Medicaid Managed Care Coordination team services today. Janice Williams agreed to services and verbal consent obtained.  Engaged with patient by telephone for initial visit in response to provider referral for case management and/or care coordination services.   Assessments/Interventions:  Review of past medical history, allergies, medications, health status, including review of consultants reports, laboratory and other test data, was performed as part of comprehensive evaluation and provision of chronic care management services.  SDOH (Social Determinants of Health) assessments and interventions performed:   Care Plan  Allergies  Allergen Reactions  . Chantix [Varenicline] Itching    Medications Reviewed Today    Reviewed by Melissa Montane, RN (Registered Nurse) on 01/03/21 at 1317  Med List Status: <None>  Medication Order Taking? Sig Documenting Provider Last Dose Status Informant  albuterol (VENTOLIN HFA) 108 (90 Base) MCG/ACT inhaler [id number] Yes Inhale 2 puffs into the lungs every 6 (six) hours as needed for wheezing or shortness of breath. [provider] Taking Active Self  amLODipine (NORVASC) 10 MG tablet [id number] Yes Take 1 tablet (10 mg total) by mouth daily. Mitzi Hansen, MD Taking Active Self  benzonatate (TESSALON) 100 MG capsule [id number] No Take 1 capsule (100 mg total) by mouth every 8 (eight) hours.  Patient not taking: Reported on 01/03/2021   Hall-Potvin, [place], PA-C Not Taking Active Self  BIKTARVY 50-200-25 MG TABS tablet [id number] Yes TAKE 1 TABLET BY MOUTH DAILY. Carlyle Basques, MD Taking Active    budesonide-formoterol [facility]) 80-4.5 MCG/ACT inhaler [id number] Yes TAKE 2 PUFFS FIRST THING IN THE MORNING AND THEN ANOTHER 2 PUFFS 12 HOURS LATER  Patient taking differently: Inhale 2 puffs into the lungs in the morning and at bedtime.   Tanda Rockers, MD Taking Active Self  calcium citrate-vitamin D (CITRACAL+D) 315-200 MG-UNIT tablet [id number]  Take 2 tablets by mouth 2 (two) times daily.  Patient not taking: Reported on 11/15/2020   Marcelyn Bruins, MD  Expired 10/09/20 2359   cetirizine (ZYRTEC ALLERGY) 10 MG tablet [id number] Yes Take 1 tablet (10 mg total) by mouth daily. Hall-Potvin, [place], PA-C Taking Active Self  Ensure (ENSURE) [id number] Yes Take 237 mLs by mouth 3 (three) times daily between meals. Carlyle Basques, MD Taking Active Self  fluticasone [facility]) 50 MCG/ACT nasal spray [id number] Yes Place 1 spray into both nostrils daily. Hall-Potvin, [place], PA-C Taking Active Self  levocetirizine (XYZAL) 5 MG tablet [id number] Yes Take 5 mg by mouth every evening. [provider] Taking Active Self  megestrol (MEGACE) 40 MG tablet [id number] Yes Take 1 tablet (40 mg total) by mouth daily. Mitzi Hansen, MD Taking Active   montelukast (SINGULAIR) 10 MG tablet [id number] Yes TAKE 1 TABLET(10 MG) BY MOUTH AT BEDTIME Sid Falcon, MD Taking Active   omeprazole (PRILOSEC) 20 MG capsule [id number] Yes Take 1 capsule (20 mg total) by mouth daily. Dewayne Hatch, MD Taking Active   polyethylene glycol [facility] / GLYCOLAX) packet [id number] Yes Take one capful in 8 oz of fluid of your choice twice a day until having soft stools, then back down to once a day.  If not having soft stools  with twice a day, increased to three times a day  Patient taking differently: Take 17 g by mouth 2 (two) times daily.   Linton Flemings, MD Taking Active Self           Med Note Vita Barley Nov 15, 2020 11:04 AM)    pravastatin (PRAVACHOL) 20 MG tablet [id number] Yes Take 20  mg by mouth daily. [provider] Taking Active Self  PRESCRIPTION MEDICATION [id number] Yes Inject 1 Dose into the skin once a week. Allergy shot in office on Tuesdays [provider] Taking Active Self  traMADol (ULTRAM) 50 MG tablet [id number] Yes Take 1 tablet (50 mg total) by mouth every 12 (twelve) hours as needed. Modena Nunnery D, DO Taking Active           Patient Active Problem List   Diagnosis Date Noted  . Degenerative disc disease, cervical 10/13/2020  . Chronic, continuous use of opioids 09/10/2020  . Rhinitis, chronic 06/10/2020  . Restless legs syndrome (RLS) 01/08/2020  . Osteoporosis 10/10/2019  . COPD gold ?  07/08/2019  . Moderate protein-calorie malnutrition ([place]) 05/31/2018  . Hypertension 06/08/2017  . Left knee pain 03/15/2015  . Acne cystica 12/22/2011  . Healthcare maintenance 07/06/2011  . EPIDERMOID CYST 02/10/2011  . Acute maxillary sinusitis 01/20/2010  . Hyperlipidemia 06/03/2009  . WEIGHT LOSS 06/03/2009  . CARBUNCLE AND FURUNCLE OF FACE 02/04/2009  . Human immunodeficiency virus (HIV) disease ([place]) 10/29/2008  . ANEMIA-NOS 10/29/2008  . PERIPHERAL NEUROPATHY 10/29/2008  . GERD 10/29/2008  . RENAL INSUFFICIENCY 10/29/2008  . CEREBROVASCULAR ACCIDENT, HX OF 10/29/2008  . TOBACCO DEPENDENCE 02/14/2007  . CVA 02/14/2007  . Allergic rhinitis 02/14/2007  . ACNE 02/14/2007  . Symptoms concerning nutrition, metabolism, and development 02/14/2007    Conditions to be addressed/monitored per PCP order:  COPD  Care Plan : COPD (Adult)  Updates made by Melissa Montane, RN since 01/03/2021 12:00 AM    Problem: Disease Progression (COPD)     Goal: Disease Progression Minimized or Managed   Start Date: 01/03/2021  Expected End Date: 03/04/2021  This Visit's Progress: On track  Priority: Medium  Note:   Current Barriers:  . Chronic Disease Management support and education needs related to COPD-Patient feels that she is managing  her health at home very well. She reports needing a raised toilet for ease and mobility. 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 Ms Feltman has a [place] aide 7 days a week for a couple of hours a day to assistance with bathing, light house duties and shopping.  Nurse Case Manager Clinical Goal(s):  [name] Over the next 60 days, patient will verbalize understanding of plan for managing COPD . Over the next 60 days, patient will attend all scheduled medical appointments . Over the next 30 days, patient will work with CM team pharmacist to review medications . Over the next 60 days, the patient will demonstrate ongoing self health care management ability as evidenced by taking medications as prescribed, notifying provider with any concerns or changes in symptoms, work on breathing exercises daily  Interventions:  . Inter-disciplinary care team collaboration (see longitudinal plan of care) . Evaluation of current treatment plan related to COPD and patient's adherence to plan as established by provider. Nash Dimmer with PCP regarding patient requested DME . Discussed plans with patient for ongoing care management follow up and provided patient with direct contact information for care management team .  Provided patient with [employer] related to breathing exercises . Pharmacy referral for medication review  Patient Goals/Self-Care Activities Over the next 60 days, patient will: - attend all scheduled appointments - take all medication as prescribed - contact your provider with any questions, concerns or changes in symptoms - do breathing exercises every day - do exercises in a comfortable position that makes breathing as easy as possible    Follow Up Plan: Telephone follow up appointment with Managed Medicaid care management team member scheduled for:03/04/21 @ 1:30pm         Follow Up:  Patient agrees to Care Plan and  Follow-up.  Plan: The Managed Medicaid care management team will reach out to the patient again over the next 60 days.  Date/time of next scheduled RN care management/care coordination outreach: 03/04/21 @ 1:30pm  Lurena Joiner RN, [place] RN Care Coordinator

## 2021-01-04 ENCOUNTER — Telehealth: Payer: Self-pay | Admitting: Internal Medicine

## 2021-01-04 ENCOUNTER — Ambulatory Visit: Payer: Medicaid Other

## 2021-01-04 NOTE — Telephone Encounter (Signed)
I reached out to [name] today to get her scheduled for a phone visit with the [place] Medicaid Pharmacist. I left my name and number for her to call me back. I will reach out again in the next 7-14 days if I do not hear back from her.

## 2021-01-05 DIAGNOSIS — M1990 Unspecified osteoarthritis, unspecified site: Secondary | ICD-10-CM | POA: Diagnosis not present

## 2021-01-06 ENCOUNTER — Telehealth: Payer: Self-pay | Admitting: Internal Medicine

## 2021-01-06 DIAGNOSIS — J449 Chronic obstructive pulmonary disease, unspecified: Secondary | ICD-10-CM

## 2021-01-06 DIAGNOSIS — M1990 Unspecified osteoarthritis, unspecified site: Secondary | ICD-10-CM | POA: Diagnosis not present

## 2021-01-06 NOTE — Telephone Encounter (Signed)
Janice Williams, Janice Williams  Brittney Caraway L, RN; Samples, Beverly; Janice Williams, Janice Williams; Stenson, Melissa; New, [place]; 1 other   received   

## 2021-01-06 NOTE — Telephone Encounter (Signed)
Placed order for 3 in 1. Request through [place] care manager. Patient has difficulty with ease and mobility 2/2 COPD.

## 2021-01-06 NOTE — Telephone Encounter (Signed)
CM sent to [facility] Samples at [facility] for [facility]. Hubbard Hartshorn, BSN, RN-BC

## 2021-01-07 DIAGNOSIS — Z7689 Persons encountering health services in other specified circumstances: Secondary | ICD-10-CM | POA: Diagnosis not present

## 2021-01-07 DIAGNOSIS — M1990 Unspecified osteoarthritis, unspecified site: Secondary | ICD-10-CM | POA: Diagnosis not present

## 2021-01-07 DIAGNOSIS — J301 Allergic rhinitis due to pollen: Secondary | ICD-10-CM | POA: Diagnosis not present

## 2021-01-07 DIAGNOSIS — J3089 Other allergic rhinitis: Secondary | ICD-10-CM | POA: Diagnosis not present

## 2021-01-08 DIAGNOSIS — M1990 Unspecified osteoarthritis, unspecified site: Secondary | ICD-10-CM | POA: Diagnosis not present

## 2021-01-09 DIAGNOSIS — M1990 Unspecified osteoarthritis, unspecified site: Secondary | ICD-10-CM | POA: Diagnosis not present

## 2021-01-10 DIAGNOSIS — M1990 Unspecified osteoarthritis, unspecified site: Secondary | ICD-10-CM | POA: Diagnosis not present

## 2021-01-10 MED FILL — BIKTARVY 50-200-25 MG TABS: 50-200-25 | 30 days supply | Qty: 30 | Fill #1

## 2021-01-11 DIAGNOSIS — J449 Chronic obstructive pulmonary disease, unspecified: Secondary | ICD-10-CM | POA: Diagnosis not present

## 2021-01-11 DIAGNOSIS — M1990 Unspecified osteoarthritis, unspecified site: Secondary | ICD-10-CM | POA: Diagnosis not present

## 2021-01-12 ENCOUNTER — Telehealth: Payer: Self-pay | Admitting: Internal Medicine

## 2021-01-12 DIAGNOSIS — M1990 Unspecified osteoarthritis, unspecified site: Secondary | ICD-10-CM | POA: Diagnosis not present

## 2021-01-12 NOTE — Telephone Encounter (Signed)
This was my third attempt to reach Janice Williams to get her scheduled with the Managed Medicaid Pharmacist. She did not answer and her voicemail was full.

## 2021-01-12 NOTE — Telephone Encounter (Signed)
I called [name] to get her scheduled with the Managed Medicaid Pharmacist for a phone visit. [name] stated she was at a hair appt and asked me to call her back this afternoon. We agreed that 3:00 would be a good time for me to call.

## 2021-01-13 DIAGNOSIS — N3946 Mixed incontinence: Secondary | ICD-10-CM | POA: Diagnosis not present

## 2021-01-13 DIAGNOSIS — M1990 Unspecified osteoarthritis, unspecified site: Secondary | ICD-10-CM | POA: Diagnosis not present

## 2021-01-13 DIAGNOSIS — R269 Unspecified abnormalities of gait and mobility: Secondary | ICD-10-CM | POA: Diagnosis not present

## 2021-01-14 DIAGNOSIS — M1990 Unspecified osteoarthritis, unspecified site: Secondary | ICD-10-CM | POA: Diagnosis not present

## 2021-01-16 DIAGNOSIS — M1990 Unspecified osteoarthritis, unspecified site: Secondary | ICD-10-CM | POA: Diagnosis not present

## 2021-01-17 ENCOUNTER — Encounter (HOSPITAL_COMMUNITY): Payer: Self-pay

## 2021-01-17 ENCOUNTER — Other Ambulatory Visit: Payer: Self-pay

## 2021-01-17 ENCOUNTER — Ambulatory Visit (HOSPITAL_COMMUNITY)
Admission: EM | Admit: 2021-01-17 | Discharge: 2021-01-17 | Disposition: A | Discharge status: Home / Self Care | Payer: Medicaid Other | Attending: Urgent Care | Admitting: Urgent Care

## 2021-01-17 DIAGNOSIS — J3489 Other specified disorders of nose and nasal sinuses: Secondary | ICD-10-CM | POA: Diagnosis not present

## 2021-01-17 DIAGNOSIS — Z7951 Long term (current) use of inhaled steroids: Secondary | ICD-10-CM | POA: Diagnosis not present

## 2021-01-17 DIAGNOSIS — Z8673 Personal history of transient ischemic attack (TIA), and cerebral infarction without residual deficits: Secondary | ICD-10-CM | POA: Diagnosis not present

## 2021-01-17 DIAGNOSIS — R42 Dizziness and giddiness: Secondary | ICD-10-CM | POA: Insufficient documentation

## 2021-01-17 DIAGNOSIS — Z79899 Other long term (current) drug therapy: Secondary | ICD-10-CM | POA: Diagnosis not present

## 2021-01-17 DIAGNOSIS — Z87891 Personal history of nicotine dependence: Secondary | ICD-10-CM | POA: Insufficient documentation

## 2021-01-17 DIAGNOSIS — J3089 Other allergic rhinitis: Secondary | ICD-10-CM | POA: Insufficient documentation

## 2021-01-17 DIAGNOSIS — J449 Chronic obstructive pulmonary disease, unspecified: Secondary | ICD-10-CM | POA: Diagnosis not present

## 2021-01-17 DIAGNOSIS — H938X3 Other specified disorders of ear, bilateral: Secondary | ICD-10-CM | POA: Diagnosis not present

## 2021-01-17 DIAGNOSIS — M1990 Unspecified osteoarthritis, unspecified site: Secondary | ICD-10-CM | POA: Diagnosis not present

## 2021-01-17 DIAGNOSIS — Z20822 Contact with and (suspected) exposure to covid-19: Secondary | ICD-10-CM | POA: Insufficient documentation

## 2021-01-17 DIAGNOSIS — Z21 Asymptomatic human immunodeficiency virus [HIV] infection status: Secondary | ICD-10-CM | POA: Insufficient documentation

## 2021-01-17 DIAGNOSIS — J019 Acute sinusitis, unspecified: Secondary | ICD-10-CM | POA: Diagnosis not present

## 2021-01-17 LAB — SARS CORONAVIRUS 2 (TAT 6-24 HRS): SARS Coronavirus 2: NEGATIVE

## 2021-01-17 MED ORDER — PREDNISONE 20 MG PO TABS: ORAL_TABLET | ORAL | 0 refills | Status: DC

## 2021-01-17 MED ORDER — AMOXICILLIN 875 MG PO TABS: 875.0000 mg | ORAL_TABLET | Freq: Two times a day (BID) | ORAL | 0 refills | Status: DC

## 2021-01-17 NOTE — ED Provider Notes (Signed)
[place]   MRN: [id number] DOB: [birth year]  Subjective:   Janice Williams is a 60 y.o. female presenting for 2-week history of persistent and worsening sinus congestion, sinus pain, bilateral ear popping now having dizziness and fatigue, fever.  Patient is COVID vaccinated, has gotten her booster.  She has a history of persistent allergic rhinitis and takes levocetirizine, Singulair for this.  She also has a history of COPD, HIV.  Denies chest pain, shortness of breath.  She is coughing but reports that her primary symptoms are in her head.  No current facility-administered medications for this encounter.  Current Outpatient Medications:  .  albuterol (VENTOLIN HFA) 108 (90 Base) MCG/ACT inhaler, Inhale 2 puffs into the lungs every 6 (six) hours as needed for wheezing or shortness of breath., Disp: , Rfl:  .  amLODipine (NORVASC) 10 MG tablet, Take 1 tablet (10 mg total) by mouth daily., Disp: 90 tablet, Rfl: 1 .  benzonatate (TESSALON) 100 MG capsule, Take 1 capsule (100 mg total) by mouth every 8 (eight) hours. (Patient not taking: No sig reported), Disp: 21 capsule, Rfl: 0 .  BIKTARVY 50-200-25 MG TABS tablet, TAKE 1 TABLET BY MOUTH DAILY., Disp: 30 tablet, Rfl: 4 .  budesonide-formoterol (SYMBICORT) 80-4.5 MCG/ACT inhaler, TAKE 2 PUFFS FIRST THING IN THE MORNING AND THEN ANOTHER 2 PUFFS 12 HOURS LATER (Patient taking differently: Inhale 2 puffs into the lungs in the morning and at bedtime.), Disp: 10.2 g, Rfl: 11 .  calcium citrate-vitamin D (CITRACAL+D) 315-200 MG-UNIT tablet, Take 2 tablets by mouth 2 (two) times daily. (Patient not taking: No sig reported), Disp: 120 tablet, Rfl: 11 .  cetirizine (ZYRTEC ALLERGY) 10 MG tablet, Take 1 tablet (10 mg total) by mouth daily., Disp: 30 tablet, Rfl: 0 .  Ensure (ENSURE), Take 237 mLs by mouth 3 (three) times daily between meals., Disp: 237 mL, Rfl: 5 .  fluticasone (FLONASE) 50 MCG/ACT nasal spray, Place 1 spray into both  nostrils daily., Disp: 16 g, Rfl: 0 .  levocetirizine (XYZAL) 5 MG tablet, Take 5 mg by mouth every evening., Disp: , Rfl:  .  megestrol (MEGACE) 40 MG tablet, Take 1 tablet (40 mg total) by mouth daily., Disp: 30 tablet, Rfl: 1 .  montelukast (SINGULAIR) 10 MG tablet, TAKE 1 TABLET(10 MG) BY MOUTH AT BEDTIME, Disp: 90 tablet, Rfl: 3 .  omeprazole (PRILOSEC) 20 MG capsule, Take 1 capsule (20 mg total) by mouth daily., Disp: 90 capsule, Rfl: 5 .  polyethylene glycol (MIRALAX / GLYCOLAX) packet, Take one capful in 8 oz of fluid of your choice twice a day until having soft stools, then back down to once a day.  If not having soft stools with twice a day, increased to three times a day (Patient taking differently: Take 17 g by mouth 2 (two) times daily.), Disp: 100 each, Rfl: 0 .  pravastatin (PRAVACHOL) 20 MG tablet, Take 20 mg by mouth daily., Disp: , Rfl:  .  PRESCRIPTION MEDICATION, Inject 1 Dose into the skin once a week. Allergy shot in office on Tuesdays, Disp: , Rfl:  .  traMADol (ULTRAM) 50 MG tablet, Take 1 tablet (50 mg total) by mouth every 12 (twelve) hours as needed., Disp: 60 tablet, Rfl: 0   Allergies  Allergen Reactions  . Chantix [Varenicline] Itching    Past Medical History:  Diagnosis Date  . Allergy   . Anemia   . Arthritis    lower back, knees  . Bronchitis   .  COPD (chronic obstructive pulmonary disease) ([place])   . COPD with acute exacerbation ([place]) 06/09/2017  . Dental abscess 03/15/2015  . DYSPEPSIA 02/14/2007   Qualifier: Diagnosis of  By: Samara Snide    . EXTERNAL OTITIS 01/06/2010   Qualifier: Diagnosis of  By: Tomma Lightning MD, Claiborne Billings    . FACIAL RASH 02/04/2009   Qualifier: Diagnosis of  By: Rhunette Croft    . Former smoker    quit 2014  . GERD (gastroesophageal reflux disease)   . HIV infection ([place])   . Hypertension   . Stroke ([place])   . 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 SVD (spontaneous vaginal delivery)    x 3     Past Surgical History:   Procedure Laterality Date  . MULTIPLE TOOTH EXTRACTIONS     with sedation  . TUBAL LIGATION    . UPPER GI ENDOSCOPY     normal per patient - "years ago"    Family History  Problem Relation Age of Onset  . Hypertension Mother   . Hypertension Father   . Hyperlipidemia Father   . COPD Father   . Atrial fibrillation Father   . Colon cancer Neg Hx   . Rectal cancer Neg Hx   . Stomach cancer Neg Hx     Social History   Tobacco Use  . Smoking status: Former Smoker    Packs/day: 0.40    Years: 38.00    Pack years: 15.20    Types: Cigarettes    Start date: 12/18/2012    Quit date: 11/21/2013    Years since quitting: 7.1  . Smokeless tobacco: Never Used  Vaping Use  . Vaping Use: Never used  Substance Use Topics  . Alcohol use: Not Currently  . 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    ROS   Objective:   Vitals: BP (S) (!) 146/92 (BP Location: Right Arm) Comment: Pt did not take BP med.  Pulse 100   Temp (!) 97.4 F (36.3 C) (Temporal)   Resp 16   Wt 103 lb (46.7 kg)   SpO2 100%   BMI 18.25 kg/m   Physical Exam Constitutional:      General: She is not in acute distress.    Appearance: Normal appearance. She is well-developed. She is not ill-appearing, toxic-appearing or diaphoretic.  HENT:     Head: Normocephalic and atraumatic.     Right Ear: External ear normal.     Left Ear: External ear normal.     Nose: Congestion and rhinorrhea present.     Comments: Maxillary sinus pain bilaterally.    Mouth/Throat:     Mouth: Mucous membranes are moist.     Pharynx: No oropharyngeal exudate or posterior oropharyngeal erythema.  Eyes:     General: No scleral icterus.       Right eye: No discharge.        Left eye: No discharge.     Extraocular Movements: Extraocular movements intact.     Conjunctiva/sclera: Conjunctivae normal.     Pupils: Pupils are equal, round, and reactive to light.  Cardiovascular:     Rate and Rhythm: Normal rate and regular  rhythm.     Pulses: Normal pulses.     Heart sounds: Normal heart sounds. No murmur heard. No friction rub. No gallop.   Pulmonary:     Effort: Pulmonary effort is normal. No respiratory distress.     Breath sounds: Normal  breath sounds. No stridor. No wheezing, rhonchi or rales.  Skin:    General: Skin is warm and dry.     Findings: No rash.  Neurological:     Mental Status: She is alert and oriented to person, place, and time.  Psychiatric:        Mood and Affect: Mood normal.        Behavior: Behavior normal.        Thought Content: Thought content normal.        Judgment: Judgment normal.     Assessment and Plan :   PDMP not reviewed this encounter.  1. Acute non-recurrent sinusitis, unspecified location   2. Dizziness   3. Sinus pain   4. Ear popping, bilateral   5. Allergic rhinitis due to other allergic trigger, unspecified seasonality     COVID-19 testing pending. Will start empiric treatment for sinusitis with amoxicillin.  Given patient's personal history of severe allergic rhinitis will also use a prednisone course.  Maintain regular medications otherwise.  I have recommended supportive care otherwise including the use of oral antihistamine, decongestant.  Counseled patient on potential for adverse effects with medications prescribed/recommended today, ER and return-to-clinic precautions discussed, patient verbalized understanding.    Jaynee Eagles, [place] 01/17/21 1237

## 2021-01-17 NOTE — ED Triage Notes (Signed)
Patient presents to Urgent Care with complaints of dizziness, sinus pressure, bilateral ear pressure, and nasal congestion x 2 weeks ago. Treating with nasal rinses with no relief.   Denies fever, abdominal pain, n/v, or diarrhea.

## 2021-01-18 DIAGNOSIS — Z7689 Persons encountering health services in other specified circumstances: Secondary | ICD-10-CM | POA: Diagnosis not present

## 2021-01-18 DIAGNOSIS — M1990 Unspecified osteoarthritis, unspecified site: Secondary | ICD-10-CM | POA: Diagnosis not present

## 2021-01-18 DIAGNOSIS — J3089 Other allergic rhinitis: Secondary | ICD-10-CM | POA: Diagnosis not present

## 2021-01-18 DIAGNOSIS — Z419 Encounter for procedure for purposes other than remedying health state, unspecified: Secondary | ICD-10-CM | POA: Diagnosis not present

## 2021-01-18 DIAGNOSIS — H1045 Other chronic allergic conjunctivitis: Secondary | ICD-10-CM | POA: Diagnosis not present

## 2021-01-18 DIAGNOSIS — J4530 Mild persistent asthma, uncomplicated: Secondary | ICD-10-CM | POA: Diagnosis not present

## 2021-01-18 DIAGNOSIS — J301 Allergic rhinitis due to pollen: Secondary | ICD-10-CM | POA: Diagnosis not present

## 2021-01-19 DIAGNOSIS — M1990 Unspecified osteoarthritis, unspecified site: Secondary | ICD-10-CM | POA: Diagnosis not present

## 2021-01-20 DIAGNOSIS — M1990 Unspecified osteoarthritis, unspecified site: Secondary | ICD-10-CM | POA: Diagnosis not present

## 2021-01-21 DIAGNOSIS — M1990 Unspecified osteoarthritis, unspecified site: Secondary | ICD-10-CM | POA: Diagnosis not present

## 2021-01-22 DIAGNOSIS — M1990 Unspecified osteoarthritis, unspecified site: Secondary | ICD-10-CM | POA: Diagnosis not present

## 2021-01-23 DIAGNOSIS — M1990 Unspecified osteoarthritis, unspecified site: Secondary | ICD-10-CM | POA: Diagnosis not present

## 2021-01-24 DIAGNOSIS — M1990 Unspecified osteoarthritis, unspecified site: Secondary | ICD-10-CM | POA: Diagnosis not present

## 2021-01-25 DIAGNOSIS — Z7689 Persons encountering health services in other specified circumstances: Secondary | ICD-10-CM | POA: Diagnosis not present

## 2021-01-25 DIAGNOSIS — M1990 Unspecified osteoarthritis, unspecified site: Secondary | ICD-10-CM | POA: Diagnosis not present

## 2021-01-25 DIAGNOSIS — H5213 Myopia, bilateral: Secondary | ICD-10-CM | POA: Diagnosis not present

## 2021-01-25 DIAGNOSIS — J3089 Other allergic rhinitis: Secondary | ICD-10-CM | POA: Diagnosis not present

## 2021-01-25 DIAGNOSIS — J301 Allergic rhinitis due to pollen: Secondary | ICD-10-CM | POA: Diagnosis not present

## 2021-01-26 DIAGNOSIS — M1990 Unspecified osteoarthritis, unspecified site: Secondary | ICD-10-CM | POA: Diagnosis not present

## 2021-01-27 ENCOUNTER — Other Ambulatory Visit: Payer: Self-pay

## 2021-01-27 ENCOUNTER — Other Ambulatory Visit: Payer: Self-pay | Admitting: Internal Medicine

## 2021-01-27 MED ORDER — PRAVASTATIN SODIUM 20 MG PO TABS
20.0000 mg | ORAL_TABLET | Freq: Every day | ORAL | 5 refills | Status: DC
Start: 2021-01-27 — End: 2021-08-11

## 2021-01-27 MED ORDER — TRAMADOL HCL 50 MG PO TABS: 50.0000 mg | ORAL_TABLET | Freq: Two times a day (BID) | ORAL | 0 refills | Status: DC | PRN

## 2021-01-27 NOTE — Telephone Encounter (Signed)
Pt is requesting her pravastatin (PRAVACHOL) 20 MG tablet  And   traMADol (ULTRAM) 50 MG tablet sent to  [facility], [place] [address] Phone:  [phone]  Fax:  [phone]      ( pt was told by the pharmacy that she  Has no more refills left ... Pt only has two pills left tramadol .[name] Pt also stated she has not had any pravastatin for month now )

## 2021-01-28 DIAGNOSIS — M1990 Unspecified osteoarthritis, unspecified site: Secondary | ICD-10-CM | POA: Diagnosis not present

## 2021-01-29 DIAGNOSIS — M1990 Unspecified osteoarthritis, unspecified site: Secondary | ICD-10-CM | POA: Diagnosis not present

## 2021-01-30 DIAGNOSIS — M1990 Unspecified osteoarthritis, unspecified site: Secondary | ICD-10-CM | POA: Diagnosis not present

## 2021-01-31 DIAGNOSIS — M1990 Unspecified osteoarthritis, unspecified site: Secondary | ICD-10-CM | POA: Diagnosis not present

## 2021-02-01 DIAGNOSIS — J301 Allergic rhinitis due to pollen: Secondary | ICD-10-CM | POA: Diagnosis not present

## 2021-02-01 DIAGNOSIS — M1990 Unspecified osteoarthritis, unspecified site: Secondary | ICD-10-CM | POA: Diagnosis not present

## 2021-02-01 DIAGNOSIS — J3089 Other allergic rhinitis: Secondary | ICD-10-CM | POA: Diagnosis not present

## 2021-02-01 DIAGNOSIS — Z7689 Persons encountering health services in other specified circumstances: Secondary | ICD-10-CM | POA: Diagnosis not present

## 2021-02-02 DIAGNOSIS — M1990 Unspecified osteoarthritis, unspecified site: Secondary | ICD-10-CM | POA: Diagnosis not present

## 2021-02-03 DIAGNOSIS — M1990 Unspecified osteoarthritis, unspecified site: Secondary | ICD-10-CM | POA: Diagnosis not present

## 2021-02-04 DIAGNOSIS — J301 Allergic rhinitis due to pollen: Secondary | ICD-10-CM | POA: Diagnosis not present

## 2021-02-04 DIAGNOSIS — Z7689 Persons encountering health services in other specified circumstances: Secondary | ICD-10-CM | POA: Diagnosis not present

## 2021-02-04 DIAGNOSIS — J3489 Other specified disorders of nose and nasal sinuses: Secondary | ICD-10-CM | POA: Diagnosis not present

## 2021-02-04 DIAGNOSIS — M1990 Unspecified osteoarthritis, unspecified site: Secondary | ICD-10-CM | POA: Diagnosis not present

## 2021-02-05 DIAGNOSIS — M1990 Unspecified osteoarthritis, unspecified site: Secondary | ICD-10-CM | POA: Diagnosis not present

## 2021-02-05 DIAGNOSIS — J3089 Other allergic rhinitis: Secondary | ICD-10-CM | POA: Diagnosis not present

## 2021-02-05 DIAGNOSIS — J301 Allergic rhinitis due to pollen: Secondary | ICD-10-CM | POA: Diagnosis not present

## 2021-02-07 DIAGNOSIS — M1990 Unspecified osteoarthritis, unspecified site: Secondary | ICD-10-CM | POA: Diagnosis not present

## 2021-02-08 DIAGNOSIS — J301 Allergic rhinitis due to pollen: Secondary | ICD-10-CM | POA: Diagnosis not present

## 2021-02-08 DIAGNOSIS — M1990 Unspecified osteoarthritis, unspecified site: Secondary | ICD-10-CM | POA: Diagnosis not present

## 2021-02-08 DIAGNOSIS — Z7689 Persons encountering health services in other specified circumstances: Secondary | ICD-10-CM | POA: Diagnosis not present

## 2021-02-08 DIAGNOSIS — J3089 Other allergic rhinitis: Secondary | ICD-10-CM | POA: Diagnosis not present

## 2021-02-08 MED FILL — BIKTARVY 50-200-25 MG TABS: 50-200-25 | 30 days supply | Qty: 30 | Fill #2

## 2021-02-09 ENCOUNTER — Other Ambulatory Visit: Payer: Self-pay | Admitting: *Deleted

## 2021-02-09 DIAGNOSIS — M1990 Unspecified osteoarthritis, unspecified site: Secondary | ICD-10-CM | POA: Diagnosis not present

## 2021-02-09 DIAGNOSIS — B20 Human immunodeficiency virus [HIV] disease: Secondary | ICD-10-CM

## 2021-02-10 MED ORDER — MEGESTROL ACETATE 40 MG PO TABS: 40.0000 mg | ORAL_TABLET | Freq: Every day | ORAL | 1 refills | Status: DC

## 2021-02-11 DIAGNOSIS — M1990 Unspecified osteoarthritis, unspecified site: Secondary | ICD-10-CM | POA: Diagnosis not present

## 2021-02-12 DIAGNOSIS — M1990 Unspecified osteoarthritis, unspecified site: Secondary | ICD-10-CM | POA: Diagnosis not present

## 2021-02-13 DIAGNOSIS — M1990 Unspecified osteoarthritis, unspecified site: Secondary | ICD-10-CM | POA: Diagnosis not present

## 2021-02-14 DIAGNOSIS — M1990 Unspecified osteoarthritis, unspecified site: Secondary | ICD-10-CM | POA: Diagnosis not present

## 2021-02-15 DIAGNOSIS — Z419 Encounter for procedure for purposes other than remedying health state, unspecified: Secondary | ICD-10-CM | POA: Diagnosis not present

## 2021-02-15 DIAGNOSIS — M1990 Unspecified osteoarthritis, unspecified site: Secondary | ICD-10-CM | POA: Diagnosis not present

## 2021-02-16 DIAGNOSIS — Z7689 Persons encountering health services in other specified circumstances: Secondary | ICD-10-CM | POA: Diagnosis not present

## 2021-02-16 DIAGNOSIS — J323 Chronic sphenoidal sinusitis: Secondary | ICD-10-CM | POA: Diagnosis not present

## 2021-02-16 DIAGNOSIS — J3489 Other specified disorders of nose and nasal sinuses: Secondary | ICD-10-CM | POA: Diagnosis not present

## 2021-02-16 DIAGNOSIS — J320 Chronic maxillary sinusitis: Secondary | ICD-10-CM | POA: Diagnosis not present

## 2021-02-16 DIAGNOSIS — M1990 Unspecified osteoarthritis, unspecified site: Secondary | ICD-10-CM | POA: Diagnosis not present

## 2021-02-16 DIAGNOSIS — J321 Chronic frontal sinusitis: Secondary | ICD-10-CM | POA: Diagnosis not present

## 2021-02-17 DIAGNOSIS — M1990 Unspecified osteoarthritis, unspecified site: Secondary | ICD-10-CM | POA: Diagnosis not present

## 2021-02-18 DIAGNOSIS — Z7689 Persons encountering health services in other specified circumstances: Secondary | ICD-10-CM | POA: Diagnosis not present

## 2021-02-18 DIAGNOSIS — M1990 Unspecified osteoarthritis, unspecified site: Secondary | ICD-10-CM | POA: Diagnosis not present

## 2021-02-18 DIAGNOSIS — J3089 Other allergic rhinitis: Secondary | ICD-10-CM | POA: Diagnosis not present

## 2021-02-18 DIAGNOSIS — J301 Allergic rhinitis due to pollen: Secondary | ICD-10-CM | POA: Diagnosis not present

## 2021-02-20 DIAGNOSIS — M1990 Unspecified osteoarthritis, unspecified site: Secondary | ICD-10-CM | POA: Diagnosis not present

## 2021-02-22 DIAGNOSIS — J301 Allergic rhinitis due to pollen: Secondary | ICD-10-CM | POA: Diagnosis not present

## 2021-02-22 DIAGNOSIS — J3089 Other allergic rhinitis: Secondary | ICD-10-CM | POA: Diagnosis not present

## 2021-02-22 DIAGNOSIS — Z7689 Persons encountering health services in other specified circumstances: Secondary | ICD-10-CM | POA: Diagnosis not present

## 2021-02-22 DIAGNOSIS — M1990 Unspecified osteoarthritis, unspecified site: Secondary | ICD-10-CM | POA: Diagnosis not present

## 2021-02-23 ENCOUNTER — Other Ambulatory Visit: Payer: Self-pay

## 2021-02-23 ENCOUNTER — Other Ambulatory Visit: Payer: Self-pay | Admitting: Internal Medicine

## 2021-02-23 DIAGNOSIS — M1990 Unspecified osteoarthritis, unspecified site: Secondary | ICD-10-CM | POA: Diagnosis not present

## 2021-02-23 MED ORDER — TRAMADOL HCL 50 MG PO TABS: 50.0000 mg | ORAL_TABLET | Freq: Two times a day (BID) | ORAL | 0 refills | Status: AC | PRN

## 2021-02-23 NOTE — Telephone Encounter (Signed)
Front desk--please arrange an appointment for her with Dr. Court Joy when he is back in clinic.

## 2021-02-23 NOTE — Telephone Encounter (Signed)
Appt has been sch with Dr. Court Joy on 04/20/2021.

## 2021-02-23 NOTE — Telephone Encounter (Signed)
Pt is requesting   traMADol (ULTRAM) 50 MG tablet sent to  [facility], [place] [address] Phone:  [phone]  Fax:  [phone]     ( pt stated that the pharmacy notified her she has no refills left on medication )

## 2021-02-24 ENCOUNTER — Telehealth: Payer: Self-pay

## 2021-02-24 ENCOUNTER — Ambulatory Visit: Payer: Medicaid Other | Admitting: Infectious Diseases

## 2021-02-24 DIAGNOSIS — M1990 Unspecified osteoarthritis, unspecified site: Secondary | ICD-10-CM | POA: Diagnosis not present

## 2021-02-24 NOTE — Telephone Encounter (Signed)
This RX was sent yesterday per Dr. Darrick Meigs via [facility] w/ receipt confirmation.  TC to patient and she was notified. SChaplin, RN,BSN

## 2021-02-24 NOTE — Telephone Encounter (Signed)
  traMADol (ULTRAM) 50 MG tablet, REFILL REQUEST @  [place], [place] AT [address] Phone:  [phone]  Fax:  [phone]     Pt would like a call back.

## 2021-02-25 DIAGNOSIS — M1990 Unspecified osteoarthritis, unspecified site: Secondary | ICD-10-CM | POA: Diagnosis not present

## 2021-02-26 DIAGNOSIS — M1990 Unspecified osteoarthritis, unspecified site: Secondary | ICD-10-CM | POA: Diagnosis not present

## 2021-02-27 DIAGNOSIS — M1990 Unspecified osteoarthritis, unspecified site: Secondary | ICD-10-CM | POA: Diagnosis not present

## 2021-02-28 ENCOUNTER — Other Ambulatory Visit: Payer: Self-pay | Admitting: Internal Medicine

## 2021-02-28 DIAGNOSIS — M1990 Unspecified osteoarthritis, unspecified site: Secondary | ICD-10-CM | POA: Diagnosis not present

## 2021-02-28 DIAGNOSIS — I10 Essential (primary) hypertension: Secondary | ICD-10-CM

## 2021-02-28 NOTE — Telephone Encounter (Signed)
Refill provided Front desk--please arrange an appointment for blood pressure follow up with one of our providers at pt's  earliest convenience. Thank you

## 2021-03-01 DIAGNOSIS — Z7689 Persons encountering health services in other specified circumstances: Secondary | ICD-10-CM | POA: Diagnosis not present

## 2021-03-01 DIAGNOSIS — M1990 Unspecified osteoarthritis, unspecified site: Secondary | ICD-10-CM | POA: Diagnosis not present

## 2021-03-01 DIAGNOSIS — J301 Allergic rhinitis due to pollen: Secondary | ICD-10-CM | POA: Diagnosis not present

## 2021-03-01 DIAGNOSIS — J3089 Other allergic rhinitis: Secondary | ICD-10-CM | POA: Diagnosis not present

## 2021-03-02 ENCOUNTER — Other Ambulatory Visit: Payer: Self-pay

## 2021-03-02 DIAGNOSIS — B20 Human immunodeficiency virus [HIV] disease: Secondary | ICD-10-CM

## 2021-03-03 DIAGNOSIS — Z7689 Persons encountering health services in other specified circumstances: Secondary | ICD-10-CM | POA: Diagnosis not present

## 2021-03-04 ENCOUNTER — Ambulatory Visit: Payer: Self-pay

## 2021-03-04 DIAGNOSIS — M1990 Unspecified osteoarthritis, unspecified site: Secondary | ICD-10-CM | POA: Diagnosis not present

## 2021-03-05 DIAGNOSIS — M1990 Unspecified osteoarthritis, unspecified site: Secondary | ICD-10-CM | POA: Diagnosis not present

## 2021-03-06 DIAGNOSIS — M1990 Unspecified osteoarthritis, unspecified site: Secondary | ICD-10-CM | POA: Diagnosis not present

## 2021-03-07 ENCOUNTER — Other Ambulatory Visit: Payer: Medicaid Other

## 2021-03-07 ENCOUNTER — Other Ambulatory Visit: Payer: Self-pay

## 2021-03-07 DIAGNOSIS — M1990 Unspecified osteoarthritis, unspecified site: Secondary | ICD-10-CM | POA: Diagnosis not present

## 2021-03-07 DIAGNOSIS — Z7689 Persons encountering health services in other specified circumstances: Secondary | ICD-10-CM | POA: Diagnosis not present

## 2021-03-07 DIAGNOSIS — B20 Human immunodeficiency virus [HIV] disease: Secondary | ICD-10-CM

## 2021-03-08 DIAGNOSIS — M1990 Unspecified osteoarthritis, unspecified site: Secondary | ICD-10-CM | POA: Diagnosis not present

## 2021-03-08 LAB — T-HELPER CELL (CD4) - (RCID CLINIC ONLY)
CD4 % Helper T Cell: 41 % (ref 33–65)
CD4 T Cell Abs: 682 /uL (ref 400–1790)

## 2021-03-09 DIAGNOSIS — M1990 Unspecified osteoarthritis, unspecified site: Secondary | ICD-10-CM | POA: Diagnosis not present

## 2021-03-09 LAB — COMPLETE METABOLIC PANEL WITH GFR
AG Ratio: 1.6 (calc) (ref 1.0–2.5)
ALT: 6 U/L (ref 6–29)
AST: 15 U/L (ref 10–35)
Albumin: 4.2 g/dL (ref 3.6–5.1)
Alkaline phosphatase (APISO): 49 U/L (ref 37–153)
BUN/Creatinine Ratio: 16 (calc) (ref 6–22)
BUN: 17 mg/dL (ref 7–25)
CO2: 24 mmol/L (ref 20–32)
Calcium: 9.2 mg/dL (ref 8.6–10.4)
Chloride: 107 mmol/L (ref 98–110)
Creat: 1.08 mg/dL — ABNORMAL HIGH (ref 0.50–0.99)
GFR, Est African American: 65 mL/min/{1.73_m2} (ref >=60)
GFR, Est Non African American: 56 mL/min/{1.73_m2} — ABNORMAL LOW (ref >=60)
Globulin: 2.6 g/dL (calc) (ref 1.9–3.7)
Glucose, Bld: 110 mg/dL — ABNORMAL HIGH (ref 65–99)
Potassium: 4.2 mmol/L (ref 3.5–5.3)
Sodium: 141 mmol/L (ref 135–146)
Total Bilirubin: 0.5 mg/dL (ref 0.2–1.2)
Total Protein: 6.8 g/dL (ref 6.1–8.1)

## 2021-03-09 LAB — CBC WITH DIFFERENTIAL/PLATELET
Absolute Monocytes: 497 cells/uL (ref 200–950)
Basophils Absolute: 38 cells/uL (ref 0–200)
Basophils Relative: 0.7 %
Eosinophils Absolute: 59 cells/uL (ref 15–500)
Eosinophils Relative: 1.1 %
HCT: 32.6 % — ABNORMAL LOW (ref 35.0–45.0)
Hemoglobin: 11.2 g/dL — ABNORMAL LOW (ref 11.7–15.5)
Lymphs Abs: 1777 cells/uL (ref 850–3900)
MCH: 33.5 pg — ABNORMAL HIGH (ref 27.0–33.0)
MCHC: 34.4 g/dL (ref 32.0–36.0)
MCV: 97.6 fL (ref 80.0–100.0)
MPV: 9.8 fL (ref 7.5–12.5)
Monocytes Relative: 9.2 %
Neutro Abs: 3029 cells/uL (ref 1500–7800)
Neutrophils Relative %: 56.1 %
Platelets: 329 10*3/uL (ref 140–400)
RBC: 3.34 10*6/uL — ABNORMAL LOW (ref 3.80–5.10)
RDW: 12.6 % (ref 11.0–15.0)
Total Lymphocyte: 32.9 %
WBC: 5.4 10*3/uL (ref 3.8–10.8)

## 2021-03-09 LAB — HIV-1 RNA QUANT-NO REFLEX-BLD
HIV 1 RNA Quant: <20 Copies/mL — ABNORMAL HIGH
HIV-1 RNA Quant, Log: <1.3 Log cps/mL — ABNORMAL HIGH

## 2021-03-09 MED FILL — BIKTARVY 50-200-25 MG TABS: 50-200-25 | 30 days supply | Qty: 30 | Fill #3

## 2021-03-11 DIAGNOSIS — Z7689 Persons encountering health services in other specified circumstances: Secondary | ICD-10-CM | POA: Diagnosis not present

## 2021-03-11 DIAGNOSIS — J301 Allergic rhinitis due to pollen: Secondary | ICD-10-CM | POA: Diagnosis not present

## 2021-03-11 DIAGNOSIS — J3089 Other allergic rhinitis: Secondary | ICD-10-CM | POA: Diagnosis not present

## 2021-03-13 DIAGNOSIS — M1990 Unspecified osteoarthritis, unspecified site: Secondary | ICD-10-CM | POA: Diagnosis not present

## 2021-03-14 ENCOUNTER — Telehealth: Payer: Self-pay | Admitting: Internal Medicine

## 2021-03-14 MED ORDER — BUDESONIDE-FORMOTEROL FUMARATE 80-4.5 MCG/ACT IN AERO
INHALATION_SPRAY | RESPIRATORY_TRACT | 5 refills | Status: DC
Start: 2021-03-14 — End: 2022-04-12

## 2021-03-14 NOTE — Telephone Encounter (Signed)
ATC patient x1 to let her know that her Symbicort refill has been sent to her pharmacy. Left message to return our call.

## 2021-03-15 ENCOUNTER — Other Ambulatory Visit (HOSPITAL_COMMUNITY): Payer: Self-pay

## 2021-03-15 DIAGNOSIS — M1990 Unspecified osteoarthritis, unspecified site: Secondary | ICD-10-CM | POA: Diagnosis not present

## 2021-03-15 NOTE — Telephone Encounter (Signed)
Will send message to triage as it was closed accidentally and to re-attempt to call pharmacy back after getting busy signal.

## 2021-03-15 NOTE — Telephone Encounter (Signed)
Called and left detailed message that the Symbicort script has been sent to the pharmacy and to call back if anything further is needed. Will close encounter.

## 2021-03-16 DIAGNOSIS — M1990 Unspecified osteoarthritis, unspecified site: Secondary | ICD-10-CM | POA: Diagnosis not present

## 2021-03-16 NOTE — Telephone Encounter (Signed)
Called and spoke with patient, patient has picked up her Symbicort at her pharmacy.  Nothing further needed.

## 2021-03-17 ENCOUNTER — Ambulatory Visit (INDEPENDENT_AMBULATORY_CARE_PROVIDER_SITE_OTHER): Payer: Medicaid Other | Admitting: Student

## 2021-03-17 ENCOUNTER — Encounter: Payer: Self-pay | Admitting: Student

## 2021-03-17 ENCOUNTER — Other Ambulatory Visit: Payer: Self-pay

## 2021-03-17 VITALS — BP 130/94 | HR 87 | Temp 98.4°F | Wt 101.1 lb

## 2021-03-17 DIAGNOSIS — J301 Allergic rhinitis due to pollen: Secondary | ICD-10-CM | POA: Diagnosis not present

## 2021-03-17 DIAGNOSIS — H9203 Otalgia, bilateral: Secondary | ICD-10-CM | POA: Diagnosis not present

## 2021-03-17 DIAGNOSIS — Z7689 Persons encountering health services in other specified circumstances: Secondary | ICD-10-CM | POA: Diagnosis not present

## 2021-03-17 DIAGNOSIS — M1990 Unspecified osteoarthritis, unspecified site: Secondary | ICD-10-CM | POA: Diagnosis not present

## 2021-03-17 MED ORDER — FLUTICASONE PROPIONATE 50 MCG/ACT NA SUSP: 1.0000 | Freq: Every day | NASAL | 0 refills | Status: DC

## 2021-03-17 MED ORDER — GABAPENTIN 300 MG PO CAPS: 300.0000 mg | ORAL_CAPSULE | Freq: Every evening | ORAL | 1 refills | Status: DC

## 2021-03-17 NOTE — Patient Instructions (Addendum)
Janice Williams,   Thank you for your visit to the [facility] today. It was a pleasure meeting you. Today we discussed the following:  1) Ear burning, pain - I have prescribed gabapentin 300 mg for you to take nightly  - I have placed a referral to audiology so you can have your hearing evaluated - I have placed a referral to a different ear nose and throat doctor as you requested   2) I refilled your fluticasone   Follow-up with your PCP, Dr. Court Joy, as previously scheduled (04/20/21 at 9:45 am)  Please bring all of your medications with you.   If you have any questions or concerns, please call our clinic at [phone] between 9am-5pm. Outside of these hours, call [phone] and ask for the internal medicine resident on call. If you feel you are having a medical emergency please call 911.

## 2021-03-17 NOTE — Progress Notes (Signed)
   CC: ears burning  HPI:  Janice Williams is a 60 y.o. woman with history as below who presents to clinic for burning sensation in her ears. Her last clinic visit was on 09/10/21.   To see the details of this patient's management of their acute and chronic problems, please refer to the Assessment & Plan under the Encounters tab.    Past Medical History:  Diagnosis Date  . Allergy   . Anemia   . Arthritis    lower back, knees  . Bronchitis   . COPD (chronic obstructive pulmonary disease) ([place])   . COPD with acute exacerbation ([place]) 06/09/2017  . Dental abscess 03/15/2015  . DYSPEPSIA 02/14/2007   Qualifier: Diagnosis of  By: Samara Snide    . EXTERNAL OTITIS 01/06/2010   Qualifier: Diagnosis of  By: Tomma Lightning MD, Claiborne Billings    . FACIAL RASH 02/04/2009   Qualifier: Diagnosis of  By: Rhunette Croft    . Former smoker    quit 2014  . GERD (gastroesophageal reflux disease)   . HIV infection ([place])   . Hypertension   . Stroke ([place])   . 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 SVD (spontaneous vaginal delivery)    x 3   Review of Systems:    Review of Systems  Constitutional: Negative for chills and fever.  HENT: Positive for ear pain, hearing loss and tinnitus. Negative for congestion, ear discharge and sinus pain.   Eyes: Negative for blurred vision.  Respiratory: Positive for shortness of breath.   Cardiovascular: Negative for chest pain.  Musculoskeletal: Negative for myalgias.  Skin: Negative for itching and rash.  Neurological: Positive for dizziness. Negative for weakness and headaches.    Physical Exam:  Vitals:   03/17/21 1318 03/17/21 1326  BP: (!) 148/91 (!) 130/94  Pulse: 89 87  Temp: 98.4 F (36.9 C)   TempSrc: Oral   SpO2: 100%   Weight: 101 lb 1.6 oz (45.9 kg)    Constitutional: well- though anxious-appearing woman sitting in chair, in no acute distress HENT: normocephalic atraumatic, mucous membranes moist; bilateral external ear canals without  erythema or drainage, bilateral tympanic membranes appear normal, without fluid, bulging, or erythema Eyes: conjunctiva non-erythematous Neck: supple, no lymphadenopathy Cardiovascular: regular rate and rhythm, no m/r/g, no lower extremity edema Pulmonary/Chest: normal work of breathing on room air, lungs clear to auscultation bilaterally Abdominal: soft, non-distended MSK: normal bulk and tone Neurological: alert & oriented x 3, normal gait Skin: warm and dry Psych: anxious while discussing ear symptoms, normal affect   Assessment & Plan:   See Encounters Tab for problem-based charting.  Patient discussed with Dr. Philipp Ovens

## 2021-03-17 NOTE — Assessment & Plan Note (Signed)
Patient presents with a 3-4 month history of bilateral burning sensation and pain in her ears. States the sensation is a fullness, like her ears need to pop but won't. Endorses periodic feeling of being off-balance but not room spinning. No nausea or vomiting. She cannot recall any specific preceding illness. She denies fevers or chills. Endorses bilateral tinnitus but no hearing impairment. States the burning is so uncomfortable at times that she becomes short of breath. Has been seen by ENT, last visit 02/04/21, at which time she had a sinus CT which was unremarkable. She was advised to continue Neti pot, fluticasone, oral allergy medicine, and short-term Sudafed. Also advised to have her dentures evaluated given report of maxillary pain. Patient reports she has been using these therapies without improvement. She has seen her dentist who does not see any issues with her dentures but is planning on removing 3 final teeth in the next few weeks. She states she would like a second ENT opinion, specifically requests referral to Dr. Leta Baptist.  On exam, she endorses pain with maneuvering the otoscope in her left ear. However both ears appear normal both externally and internally. No skin changes.  Assessment/Plan: It is reassuring that the patient has been evaluated by ENT who have not identified any emergent etiology for her symptoms. However, she reports ongoing distress from ear burning and pain. Unsure what to make of Ms. Swearingin' symptoms. Considered vestibular neuritis, though time course is longer than would expect for this. Vertigo does not seem to be a main symptom, so central etiology seems less likely than peripheral etiology. Will refer to different ENT for second opinion per patient's request. Will refer to audiology for formal hearing evaluation. - instructed patient to continue therapies as recommended by ENT - will trial low-dose gabapentin for possible nerve pain: gabapentin 300 mg QHS - referral to  audiology - referral to ENT Leta Baptist MD) per patient's request

## 2021-03-18 DIAGNOSIS — J301 Allergic rhinitis due to pollen: Secondary | ICD-10-CM | POA: Diagnosis not present

## 2021-03-18 DIAGNOSIS — M1990 Unspecified osteoarthritis, unspecified site: Secondary | ICD-10-CM | POA: Diagnosis not present

## 2021-03-18 DIAGNOSIS — Z124 Encounter for screening for malignant neoplasm of cervix: Secondary | ICD-10-CM | POA: Insufficient documentation

## 2021-03-18 DIAGNOSIS — J3089 Other allergic rhinitis: Secondary | ICD-10-CM | POA: Diagnosis not present

## 2021-03-18 DIAGNOSIS — Z419 Encounter for procedure for purposes other than remedying health state, unspecified: Secondary | ICD-10-CM | POA: Diagnosis not present

## 2021-03-18 NOTE — Progress Notes (Signed)
Subjective:    Janice Williams is a 60 y.o. female here for cervical cancer screening with pap smear.   Review of Systems: Current GYN complaints or concerns: none. Post-menopausal. Not sexually active currently.  Patient denies any abdominal/pelvic pain, problems with bowel movements, urination, vaginal discharge or intercourse.   Past Medical History:  Diagnosis Date  . Allergy   . Anemia   . Arthritis    lower back, knees  . Bronchitis   . COPD (chronic obstructive pulmonary disease) ([place])   . COPD with acute exacerbation ([place]) 06/09/2017  . Dental abscess 03/15/2015  . DYSPEPSIA 02/14/2007   Qualifier: Diagnosis of  By: Samara Snide    . EXTERNAL OTITIS 01/06/2010   Qualifier: Diagnosis of  By: Tomma Lightning MD, Claiborne Billings    . FACIAL RASH 02/04/2009   Qualifier: Diagnosis of  By: Rhunette Croft    . Former smoker    quit 2014  . GERD (gastroesophageal reflux disease)   . HIV infection ([place])   . Hypertension   . Stroke ([place])   . 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 SVD (spontaneous vaginal delivery)    x 3    Gynecologic History: [id number]  No LMP recorded. Patient is postmenopausal. Contraception: abstinence, post-menopausal  Last Pap: 02/2018. Results were: normal Anal Intercourse:  Last Mammogram: 09-2019. Results were: normal   Objective:  Physical Exam  Constitutional: Well developed, well nourished, no acute distress. She is alert and oriented x3.  Pelvic: External genitalia with a 2 cm soft nodule to external left labia. Non-tender. Non-erythematous. Soft & mobile. The vaginal walls are pale pink and atrophied. The cervix is bulbous and easily visualized. No CMT, thin white cervical mucus present.  Breasts: symmetrical in contour, shape and texture.  Psych: She has a normal mood and affect.     Assessment & Plan:    Patient Active Problem List   Diagnosis Date Noted  . Screening for cervical cancer 03/18/2021  . Ear pain, bilateral 03/17/2021   . Degenerative disc disease, cervical 10/13/2020  . Chronic, continuous use of opioids 09/10/2020  . Rhinitis, chronic 06/10/2020  . Restless legs syndrome (RLS) 01/08/2020  . Osteoporosis 10/10/2019  . COPD gold ?  07/08/2019  . Moderate protein-calorie malnutrition ([place]) 05/31/2018  . Hypertension 06/08/2017  . Left knee pain 03/15/2015  . Acne cystica 12/22/2011  . Healthcare maintenance 07/06/2011  . EPIDERMOID CYST 02/10/2011  . Hyperlipidemia 06/03/2009  . WEIGHT LOSS 06/03/2009  . Human immunodeficiency virus (HIV) disease ([place]) 10/29/2008  . ANEMIA-NOS 10/29/2008  . PERIPHERAL NEUROPATHY 10/29/2008  . GERD 10/29/2008  . RENAL INSUFFICIENCY 10/29/2008  . CEREBROVASCULAR ACCIDENT, HX OF 10/29/2008  . TOBACCO DEPENDENCE 02/14/2007  . CVA 02/14/2007  . Allergic rhinitis 02/14/2007  . ACNE 02/14/2007  . Symptoms concerning nutrition, metabolism, and development 02/14/2007    Problem List Items Addressed This Visit      Unprioritized   Screening for cervical cancer    Normal pelvic exam for post-menopausal female. 2 cm cyst on left labia without any concern for or signs of irritation or secondary infection. She does not want to consider removal at this time.  Discussed adequate lubrication with sex given atrophy. Thin white mucus - could be normal vs BV. No symptoms to declare.  Cervical brushing collected for cytology and HPV.  Discussed recommended screening interval for women living with HIV disease meant to be lifelong and at an interval of Q1-3 years  pending results. Acceptable to space out Q3y with 3 consecutively normal exams. Further recommendations for Janice Williams to follow today's results.  Results will be communicated to the patient via MyChart portal       Relevant Orders   Cytology - PAP( [place])   Human immunodeficiency virus (HIV) disease ([place]) - Primary    FU with Dr. Baxter Flattery today as scheduled.          [name], MSN, NP-C [facility] [email or web address] Pager: [phone] Office: [place]: [phone]   03/21/21 9:28 AM

## 2021-03-18 NOTE — Progress Notes (Signed)
Internal Medicine Clinic Attending  Case discussed with Dr. Watson  At the time of the visit.  We reviewed the resident's history and exam and pertinent patient test results.  I agree with the assessment, diagnosis, and plan of care documented in the resident's note.  

## 2021-03-18 NOTE — Assessment & Plan Note (Addendum)
Normal pelvic exam for post-menopausal female. 2 cm cyst on left labia without any concern for or signs of irritation or secondary infection. She does not want to consider removal at this time.  Discussed adequate lubrication with sex given atrophy. Thin white mucus - could be normal vs BV. No symptoms to declare.  Cervical brushing collected for cytology and HPV.  Discussed recommended screening interval for women living with HIV disease meant to be lifelong and at an interval of Q1-3 years pending results. Acceptable to space out Q3y with 3 consecutively normal exams. Further recommendations for Fenix D Towell to follow today's results.  Results will be communicated to the patient via MyChart portal

## 2021-03-19 DIAGNOSIS — M1990 Unspecified osteoarthritis, unspecified site: Secondary | ICD-10-CM | POA: Diagnosis not present

## 2021-03-21 ENCOUNTER — Encounter: Payer: Self-pay | Admitting: Infectious Diseases

## 2021-03-21 ENCOUNTER — Ambulatory Visit (INDEPENDENT_AMBULATORY_CARE_PROVIDER_SITE_OTHER): Payer: Medicaid Other | Admitting: Internal Medicine

## 2021-03-21 ENCOUNTER — Ambulatory Visit (INDEPENDENT_AMBULATORY_CARE_PROVIDER_SITE_OTHER): Payer: Medicaid Other | Admitting: Infectious Diseases

## 2021-03-21 ENCOUNTER — Other Ambulatory Visit: Payer: Self-pay

## 2021-03-21 ENCOUNTER — Other Ambulatory Visit (HOSPITAL_COMMUNITY)
Admission: RE | Admit: 2021-03-21 | Discharge: 2021-03-21 | Disposition: A | Discharge status: Home / Self Care | Payer: Medicaid Other | Source: Ambulatory Visit | Attending: Infectious Diseases | Admitting: Infectious Diseases

## 2021-03-21 VITALS — BP 124/81 | HR 88 | Temp 98.3°F | Wt 100.0 lb

## 2021-03-21 DIAGNOSIS — F4321 Adjustment disorder with depressed mood: Secondary | ICD-10-CM

## 2021-03-21 DIAGNOSIS — J301 Allergic rhinitis due to pollen: Secondary | ICD-10-CM | POA: Diagnosis not present

## 2021-03-21 DIAGNOSIS — R636 Underweight: Secondary | ICD-10-CM

## 2021-03-21 DIAGNOSIS — B20 Human immunodeficiency virus [HIV] disease: Secondary | ICD-10-CM | POA: Diagnosis not present

## 2021-03-21 DIAGNOSIS — H9319 Tinnitus, unspecified ear: Secondary | ICD-10-CM

## 2021-03-21 DIAGNOSIS — Z124 Encounter for screening for malignant neoplasm of cervix: Secondary | ICD-10-CM | POA: Insufficient documentation

## 2021-03-21 DIAGNOSIS — M1990 Unspecified osteoarthritis, unspecified site: Secondary | ICD-10-CM | POA: Diagnosis not present

## 2021-03-21 DIAGNOSIS — Z7689 Persons encountering health services in other specified circumstances: Secondary | ICD-10-CM | POA: Diagnosis not present

## 2021-03-21 MED ORDER — DICLOFENAC SODIUM 1 % EX GEL: 2.0000 g | Freq: Four times a day (QID) | CUTANEOUS | 1 refills | Status: DC

## 2021-03-21 MED ORDER — NAPROXEN 500 MG PO TABS: 500.0000 mg | ORAL_TABLET | Freq: Two times a day (BID) | ORAL | 0 refills | Status: DC

## 2021-03-21 NOTE — Assessment & Plan Note (Signed)
FU with Dr. Baxter Flattery today as scheduled.

## 2021-03-21 NOTE — Progress Notes (Signed)
RFV: follow up for hiv disease  Patient ID: Janice Williams, female   DOB: [birth year], 60 y.o.   MRN: [id number]  HPI Janice Williams is a 60yo F with hiv disease, CD 4 count of 682/VL<20 on biktarvy. Doing well with adherence  Has been to ENT about her bilateral ear pain for the last 3 months.. Sinus is okay. Started on gabapentin 300mg  on Thursday.  She has seen ENT x 3 times but not found cause. Has been on antibiotic course as well as steroids without relief. As well as ear drops.  Getting allergy shot tomorrow  Has all 3 covid vaccines plus flu  Soc hx: her father has leukemia starting palliative care and provide care for him  Outpatient Encounter Medications as of 03/21/2021  Medication Sig  . albuterol (VENTOLIN HFA) 108 (90 Base) MCG/ACT inhaler Inhale 2 puffs into the lungs every 6 (six) hours as needed for wheezing or shortness of breath.  [name] amLODipine (NORVASC) 10 MG tablet TAKE 1 TABLET(10 MG) BY MOUTH DAILY  . amoxicillin (AMOXIL) 875 MG tablet Take 1 tablet (875 mg total) by mouth 2 (two) times daily.  . benzonatate (TESSALON) 100 MG capsule Take 1 capsule (100 mg total) by mouth every 8 (eight) hours.  [name] BIKTARVY 50-200-25 MG TABS tablet TAKE 1 TABLET BY MOUTH DAILY.  . budesonide-formoterol (SYMBICORT) 80-4.5 MCG/ACT inhaler TAKE 2 PUFFS FIRST THING IN THE MORNING AND THEN ANOTHER 2 PUFFS 12 HOURS LATER  . cetirizine (ZYRTEC ALLERGY) 10 MG tablet Take 1 tablet (10 mg total) by mouth daily.  . Ensure (ENSURE) Take 237 mLs by mouth 3 (three) times daily between meals.  . fluticasone (FLONASE) 50 MCG/ACT nasal spray Place 1 spray into both nostrils daily.  [name] gabapentin (NEURONTIN) 300 MG capsule Take 1 capsule (300 mg total) by mouth at bedtime.  [name] levocetirizine (XYZAL) 5 MG tablet Take 5 mg by mouth every evening.  . megestrol (MEGACE) 40 MG tablet Take 1 tablet (40 mg total) by mouth daily.  . montelukast (SINGULAIR) 10 MG tablet TAKE 1 TABLET(10 MG) BY MOUTH AT BEDTIME  .  omeprazole (PRILOSEC) 20 MG capsule Take 1 capsule (20 mg total) by mouth daily.  . polyethylene glycol (MIRALAX / GLYCOLAX) packet Take one capful in 8 oz of fluid of your choice twice a day until having soft stools, then back down to once a day.  If not having soft stools with twice a day, increased to three times a day (Patient taking differently: Take 17 g by mouth 2 (two) times daily.)  . pravastatin (PRAVACHOL) 20 MG tablet Take 1 tablet (20 mg total) by mouth daily.  [name] PRESCRIPTION MEDICATION Inject 1 Dose into the skin once a week. Allergy shot in office on Tuesdays  . traMADol (ULTRAM) 50 MG tablet Take 1 tablet (50 mg total) by mouth every 12 (twelve) hours as needed.  . calcium citrate-vitamin D (CITRACAL+D) 315-200 MG-UNIT tablet Take 2 tablets by mouth 2 (two) times daily. (Patient not taking: No sig reported)  . predniSONE (DELTASONE) 20 MG tablet Take 2 tablets daily with breakfast. (Patient not taking: Reported on 03/21/2021)   No facility-administered encounter medications on file as of 03/21/2021.     Patient Active Problem List   Diagnosis Date Noted  . Screening for cervical cancer 03/18/2021  . Ear pain, bilateral 03/17/2021  . Degenerative disc disease, cervical 10/13/2020  . Chronic, continuous use of opioids 09/10/2020  . Rhinitis, chronic 06/10/2020  . Restless legs syndrome (RLS) 01/08/2020  .  Osteoporosis 10/10/2019  . COPD gold ?  07/08/2019  . Moderate protein-calorie malnutrition ([place]) 05/31/2018  . Hypertension 06/08/2017  . Left knee pain 03/15/2015  . Acne cystica 12/22/2011  . Healthcare maintenance 07/06/2011  . EPIDERMOID CYST 02/10/2011  . Hyperlipidemia 06/03/2009  . WEIGHT LOSS 06/03/2009  . Human immunodeficiency virus (HIV) disease ([place]) 10/29/2008  . ANEMIA-NOS 10/29/2008  . PERIPHERAL NEUROPATHY 10/29/2008  . GERD 10/29/2008  . RENAL INSUFFICIENCY 10/29/2008  . CEREBROVASCULAR ACCIDENT, HX OF 10/29/2008  . TOBACCO DEPENDENCE 02/14/2007  . CVA  02/14/2007  . Allergic rhinitis 02/14/2007  . ACNE 02/14/2007  . Symptoms concerning nutrition, metabolism, and development 02/14/2007     Health Maintenance Due  Topic Date Due  . COVID-19 Vaccine (4 - Booster for Moderna series) 02/04/2021  . PAP SMEAR-Modifier  02/19/2021     Review of Systems +congested. Throat itchiness. Otherwise 12 point ros is negative Physical Exam   BP 124/81   Pulse 88   Temp 98.3 F (36.8 C) (Oral)   Wt 100 lb (45.4 kg)   BMI 17.71 kg/m   Physical Exam  Constitutional:  oriented to person, place, and time. appears well-developed and well-nourished. No distress.  HENT: [place]/AT, PERRLA, no scleral icterus Mouth/Throat: Oropharynx is clear and moist. No oropharyngeal exudate.  Cardiovascular: Normal rate, regular rhythm and normal heart sounds. Exam reveals no gallop and no friction rub.  No murmur heard.  Pulmonary/Chest: Effort normal and breath sounds normal. No respiratory distress.  has no wheezes.  Neck = supple, no nuchal rigidity Abdominal: Soft. Bowel sounds are normal.  exhibits no distension. There is no tenderness.  Lymphadenopathy: no cervical adenopathy. No axillary adenopathy Neurological: alert and oriented to person, place, and time.  Skin: Skin is warm and dry. No rash noted. No erythema.  Psychiatric: tearful   Lab Results  Component Value Date   CD4TCELL 41 03/07/2021   Lab Results  Component Value Date   CD4TABS 682 03/07/2021   CD4TABS 886 09/22/2020   CD4TABS 721 12/22/2019   Lab Results  Component Value Date   HIV1RNAQUANT <20 (H) 03/07/2021   Lab Results  Component Value Date   HEPBSAB NEG 12/28/2014   Lab Results  Component Value Date   LABRPR NON-REACTIVE 09/22/2020    CBC Lab Results  Component Value Date   WBC 5.4 03/07/2021   RBC 3.34 (L) 03/07/2021   HGB 11.2 (L) 03/07/2021   HCT 32.6 (L) 03/07/2021   PLT 329 03/07/2021   MCV 97.6 03/07/2021   MCH 33.5 (H) 03/07/2021   MCHC 34.4 03/07/2021    RDW 12.6 03/07/2021   LYMPHSABS 1,777 03/07/2021   MONOABS 0.4 11/15/2020   EOSABS 59 03/07/2021    BMET Lab Results  Component Value Date   NA 141 03/07/2021   K 4.2 03/07/2021   CL 107 03/07/2021   CO2 24 03/07/2021   GLUCOSE 110 (H) 03/07/2021   BUN 17 03/07/2021   CREATININE 1.08 (H) 03/07/2021   CALCIUM 9.2 03/07/2021   GFRNONAA 56 (L) 03/07/2021   GFRAA 65 03/07/2021      Assessment and Plan  Tinnitus/ear pain/fullness = to continue with gabapentin. Will try to see them back in roughly 1-2 weeks for follow up. No rash or redness.i wonder if this is temporomandibular disorder/tmj. Can try diclofenac cream and short course of naproxen  hiv disease = continue on biktarvy, continues to be well controlled  Long term medication management = cr appears stable  Grief = recommend that she  can see in house counselor for grieving  underweight = still recommend to try to improve protein/overal caloric intake  Seasonal allergies = recommend OTC claritin or zyrtec to help with symptoms

## 2021-03-22 DIAGNOSIS — J301 Allergic rhinitis due to pollen: Secondary | ICD-10-CM | POA: Diagnosis not present

## 2021-03-22 DIAGNOSIS — Z7689 Persons encountering health services in other specified circumstances: Secondary | ICD-10-CM | POA: Diagnosis not present

## 2021-03-22 DIAGNOSIS — J3089 Other allergic rhinitis: Secondary | ICD-10-CM | POA: Diagnosis not present

## 2021-03-22 DIAGNOSIS — M1990 Unspecified osteoarthritis, unspecified site: Secondary | ICD-10-CM | POA: Diagnosis not present

## 2021-03-23 ENCOUNTER — Telehealth: Payer: Self-pay

## 2021-03-23 ENCOUNTER — Ambulatory Visit (INDEPENDENT_AMBULATORY_CARE_PROVIDER_SITE_OTHER): Payer: Medicaid Other | Admitting: Internal Medicine

## 2021-03-23 ENCOUNTER — Other Ambulatory Visit: Payer: Self-pay

## 2021-03-23 DIAGNOSIS — J301 Allergic rhinitis due to pollen: Secondary | ICD-10-CM

## 2021-03-23 DIAGNOSIS — M1990 Unspecified osteoarthritis, unspecified site: Secondary | ICD-10-CM | POA: Diagnosis not present

## 2021-03-23 LAB — CYTOLOGY - PAP
Chlamydia: NEGATIVE
Comment: NEGATIVE
Comment: NEGATIVE
Comment: NORMAL
Diagnosis: NEGATIVE
High risk HPV: NEGATIVE
Neisseria Gonorrhea: NEGATIVE

## 2021-03-23 MED ORDER — BENZONATATE 100 MG PO CAPS: 100.0000 mg | ORAL_CAPSULE | Freq: Three times a day (TID) | ORAL | 0 refills | Status: DC

## 2021-03-23 NOTE — Progress Notes (Signed)
  [facility] Internal Medicine Residency Telephone Encounter Continuity Care Appointment  HPI:   This telephone encounter was created for [name] on 03/23/2021 for the following purpose/cc cough. She notes that she has had a dry cough all night that persisted into this morning. Denies any fevers. Good appetite. Has an itch at the back of her throat and believes this may be exacerbating her cough. She has been using her neti pot, flonase, and oral allergy medications. She notes that Tessalon pearls have worked for her in the past and is requesting these.    Past Medical History:  Past Medical History:  Diagnosis Date  . Allergy   . Anemia   . Arthritis    lower back, knees  . Bronchitis   . COPD (chronic obstructive pulmonary disease) ([place])   . COPD with acute exacerbation ([place]) 06/09/2017  . Dental abscess 03/15/2015  . DYSPEPSIA 02/14/2007   Qualifier: Diagnosis of  By: Samara Snide    . EXTERNAL OTITIS 01/06/2010   Qualifier: Diagnosis of  By: Tomma Lightning MD, Claiborne Billings    . FACIAL RASH 02/04/2009   Qualifier: Diagnosis of  By: Rhunette Croft    . Former smoker    quit 2014  . GERD (gastroesophageal reflux disease)   . HIV infection ([place])   . Hypertension   . Stroke ([place])   . 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 SVD (spontaneous vaginal delivery)    x 3      ROS:   Negative except as stated in HPI.    Assessment / Plan / Recommendations:   Please see A&P under problem oriented charting for assessment of the patient's acute and chronic medical conditions.   As always, pt is advised that if symptoms worsen or new symptoms arise, they should go to an urgent care facility or to to ER for further evaluation.   Consent and Medical Decision Making:   Patient discussed with Dr. Evette Doffing  This is a telephone encounter between Janice Williams and Roland on 03/23/2021 for nonproductive cough. The visit was conducted with the patient located at home and [place]  at [facility]. The patient's identity was confirmed using their DOB and current address. The patient has consented to being evaluated through a telephone encounter and understands the associated risks (an examination cannot be done and the patient may need to come in for an appointment) / benefits (allows the patient to remain at home, decreasing exposure to coronavirus). I personally spent 10 minutes on medical discussion.

## 2021-03-23 NOTE — Telephone Encounter (Signed)
Called pt - stated she has a dry cough; unable to sleep d/t coughing. Stated she has taken something in the past; gel-like pill ( probably Tessalon perles which was ordered at ED visit) and she stated she mentioned this to the doctor at her last visit; I did not see in Dr Philip Aspen note. I explained telehealth appt/ pt agreeable - no available appts on blue team; appt scheduled with Dr Konrad Penta @ 1515 PM.

## 2021-03-23 NOTE — Telephone Encounter (Signed)
Requesting to speak with a nurse about cough and meds. Please call pt back.

## 2021-03-23 NOTE — Assessment & Plan Note (Signed)
Patient with history of allergic rhinitis for which she is on singulair, claritin, and flonase. She has a history of postnasal drip for which she endorses using NetiPot as directed is presenting for one day of nonproductive cough. She endorses feeling irritation at the back of her throat which she believes is causing her cough. She denies any fevers/chills or dyspnea. She is using her symbicort as directed and denies having to use her rescue inhaler.  Suspect her cough is triggered by her allergic rhinitis and will treat with short course of Tessalon pearls.  However, given her HIV status (viral load undetectable, CD4 >500) on stable anti-retroviral therapy with Biktarvy, if no significant improvement in cough over the next several weeks, would have low threshold for imaging and evaluation for other causes of her cough.

## 2021-03-24 DIAGNOSIS — Z7689 Persons encountering health services in other specified circumstances: Secondary | ICD-10-CM | POA: Diagnosis not present

## 2021-03-24 DIAGNOSIS — M1990 Unspecified osteoarthritis, unspecified site: Secondary | ICD-10-CM | POA: Diagnosis not present

## 2021-03-24 NOTE — Progress Notes (Signed)
Internal Medicine Clinic Attending ° °Case discussed with Dr. Aslam  At the time of the visit.  We reviewed the resident’s history and exam and pertinent patient test results.  I agree with the assessment, diagnosis, and plan of care documented in the resident’s note.  °

## 2021-03-26 DIAGNOSIS — M1990 Unspecified osteoarthritis, unspecified site: Secondary | ICD-10-CM | POA: Diagnosis not present

## 2021-03-27 DIAGNOSIS — M1990 Unspecified osteoarthritis, unspecified site: Secondary | ICD-10-CM | POA: Diagnosis not present

## 2021-03-28 ENCOUNTER — Telehealth: Payer: Self-pay

## 2021-03-28 DIAGNOSIS — M1990 Unspecified osteoarthritis, unspecified site: Secondary | ICD-10-CM | POA: Diagnosis not present

## 2021-03-28 NOTE — Telephone Encounter (Signed)
RTC, pt has several concerns:'  1.  Requesting lower dose of gabapentin.  Currently taking 300mg  QHS, states when she wakes up, she feels jittery and drained of energy.  2.  Pt had Telehealth visit on 03/23/21 for cough/allergic rhinitis and was given tessalon.  States she is still coughing, it is productive, greenish mucous.  RN gave pt an appt for eval on 03/30/21 @ 3:45 w/ blue team/ Dr. Laural Golden.  3.  Requesting refill on Tramadol, this has fallen off of her med list.  Last time RX was sent was 02/23/21  Thanks, [employer]

## 2021-03-28 NOTE — Telephone Encounter (Signed)
Pt is requesting a call back .. she stated that she was in a week ago and was given gabapentin (NEURONTIN) 300 MG capsule but it is to strong and would like to know if she can get the dose cut down

## 2021-03-28 NOTE — Telephone Encounter (Signed)
RTC, patient only has gabapentin 300mg  capsules, she was informed to stop medication and to discuss with MD at appt on Wednesday.  Also informed to discuss refill for Tramadol at this appt, she verbalized understanding. SChaplin, RN,BSN

## 2021-03-28 NOTE — Telephone Encounter (Signed)
Hi Janice Williams thanks for speaking with the patient. Agree that patient should be seen about the ongoing cough and to clarify what the tramadol is for. As for the gabapentin, if she has be 100mg  tablet she can try 100 or 200 mg at night time instead of the 300 mg. If she has the 300 mg doses she can stop the medication until the 13th and discuss with Dr. Laural Golden, if the ear discomfort is bearable.

## 2021-03-29 DIAGNOSIS — M1990 Unspecified osteoarthritis, unspecified site: Secondary | ICD-10-CM | POA: Diagnosis not present

## 2021-03-29 DIAGNOSIS — J301 Allergic rhinitis due to pollen: Secondary | ICD-10-CM | POA: Diagnosis not present

## 2021-03-29 DIAGNOSIS — J3089 Other allergic rhinitis: Secondary | ICD-10-CM | POA: Diagnosis not present

## 2021-03-29 DIAGNOSIS — Z7689 Persons encountering health services in other specified circumstances: Secondary | ICD-10-CM | POA: Diagnosis not present

## 2021-03-30 ENCOUNTER — Other Ambulatory Visit: Payer: Self-pay

## 2021-03-30 ENCOUNTER — Ambulatory Visit (HOSPITAL_COMMUNITY)
Admission: RE | Admit: 2021-03-30 | Discharge: 2021-03-30 | Disposition: A | Discharge status: Home / Self Care | Payer: Medicaid Other | Source: Ambulatory Visit | Attending: Internal Medicine | Admitting: Internal Medicine

## 2021-03-30 ENCOUNTER — Ambulatory Visit (INDEPENDENT_AMBULATORY_CARE_PROVIDER_SITE_OTHER): Payer: Medicaid Other | Admitting: Internal Medicine

## 2021-03-30 VITALS — BP 128/95 | HR 92 | Temp 98.2°F | Ht 63.0 in | Wt 104.3 lb

## 2021-03-30 DIAGNOSIS — M1990 Unspecified osteoarthritis, unspecified site: Secondary | ICD-10-CM | POA: Diagnosis not present

## 2021-03-30 DIAGNOSIS — R059 Cough, unspecified: Secondary | ICD-10-CM | POA: Diagnosis not present

## 2021-03-30 DIAGNOSIS — M25562 Pain in left knee: Secondary | ICD-10-CM

## 2021-03-30 DIAGNOSIS — H9203 Otalgia, bilateral: Secondary | ICD-10-CM | POA: Diagnosis not present

## 2021-03-30 DIAGNOSIS — G8929 Other chronic pain: Secondary | ICD-10-CM | POA: Diagnosis not present

## 2021-03-30 DIAGNOSIS — J449 Chronic obstructive pulmonary disease, unspecified: Secondary | ICD-10-CM

## 2021-03-30 IMAGING — CR DG CHEST 2V
2 series · 2 of 2 positions shown · non-contrast
Comparison: [DATE]

CLINICAL DATA: Cough

EXAM:
CHEST - 2 VIEW

[chest pa]
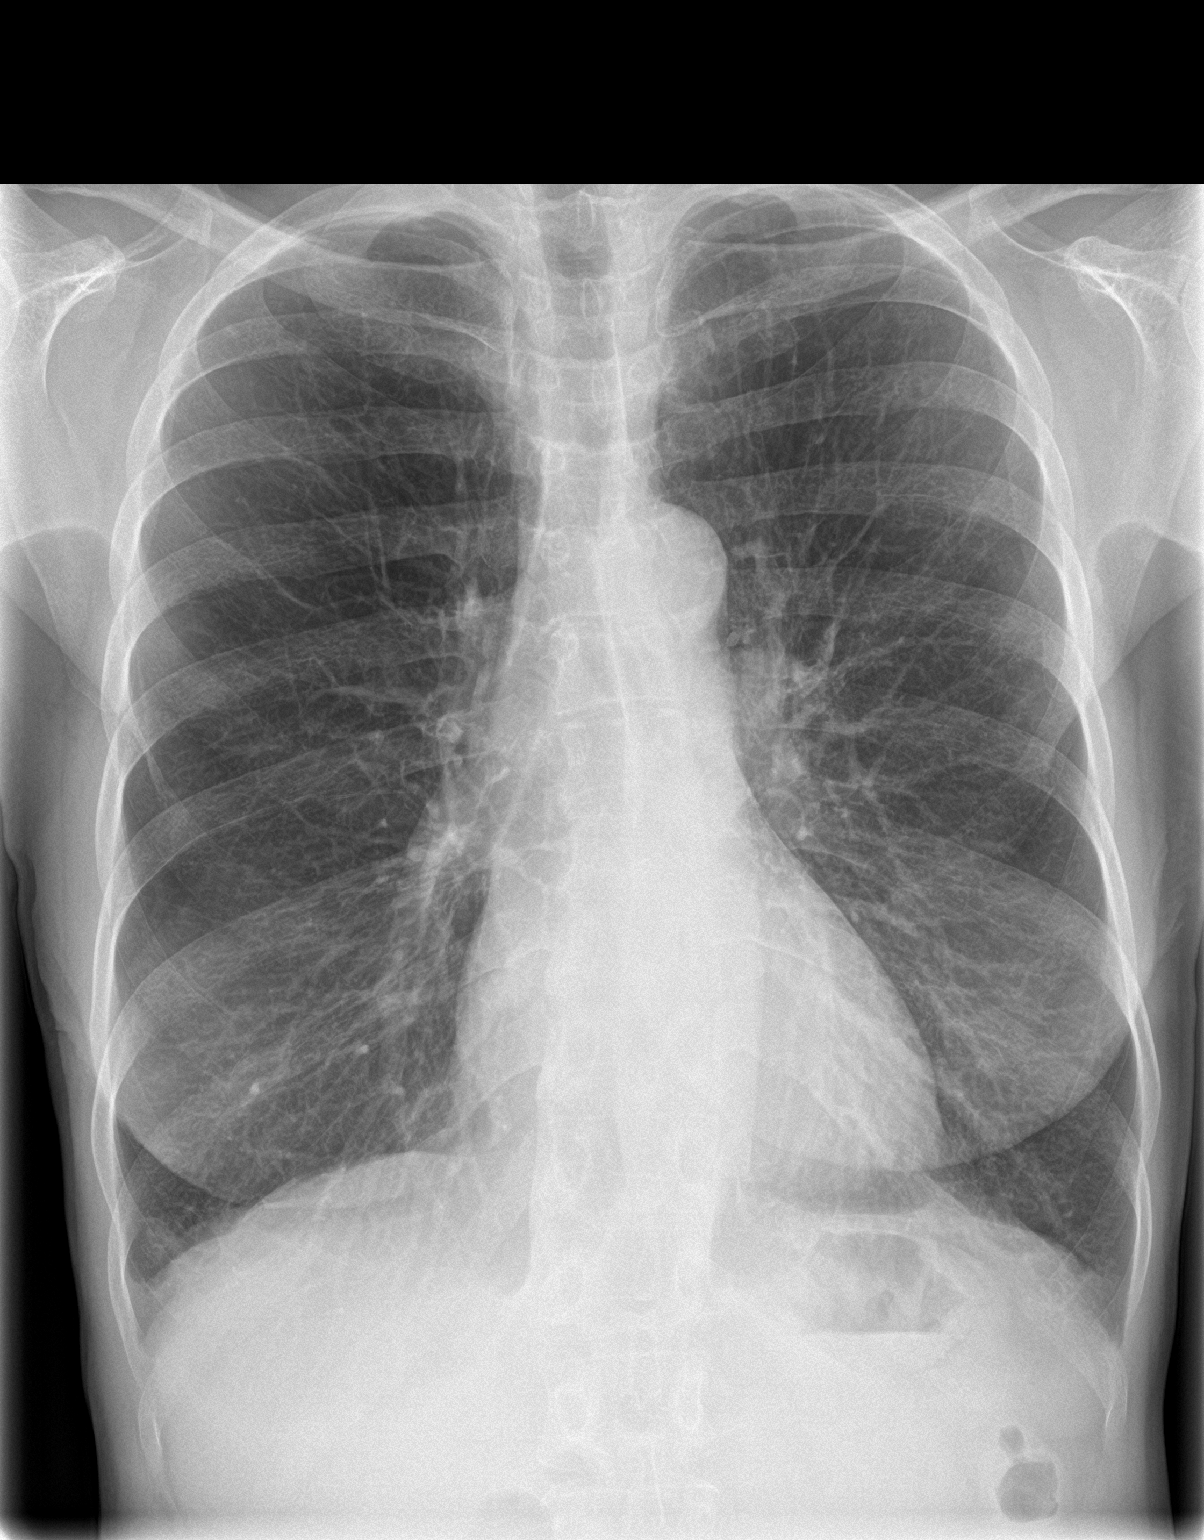

[chest lat]
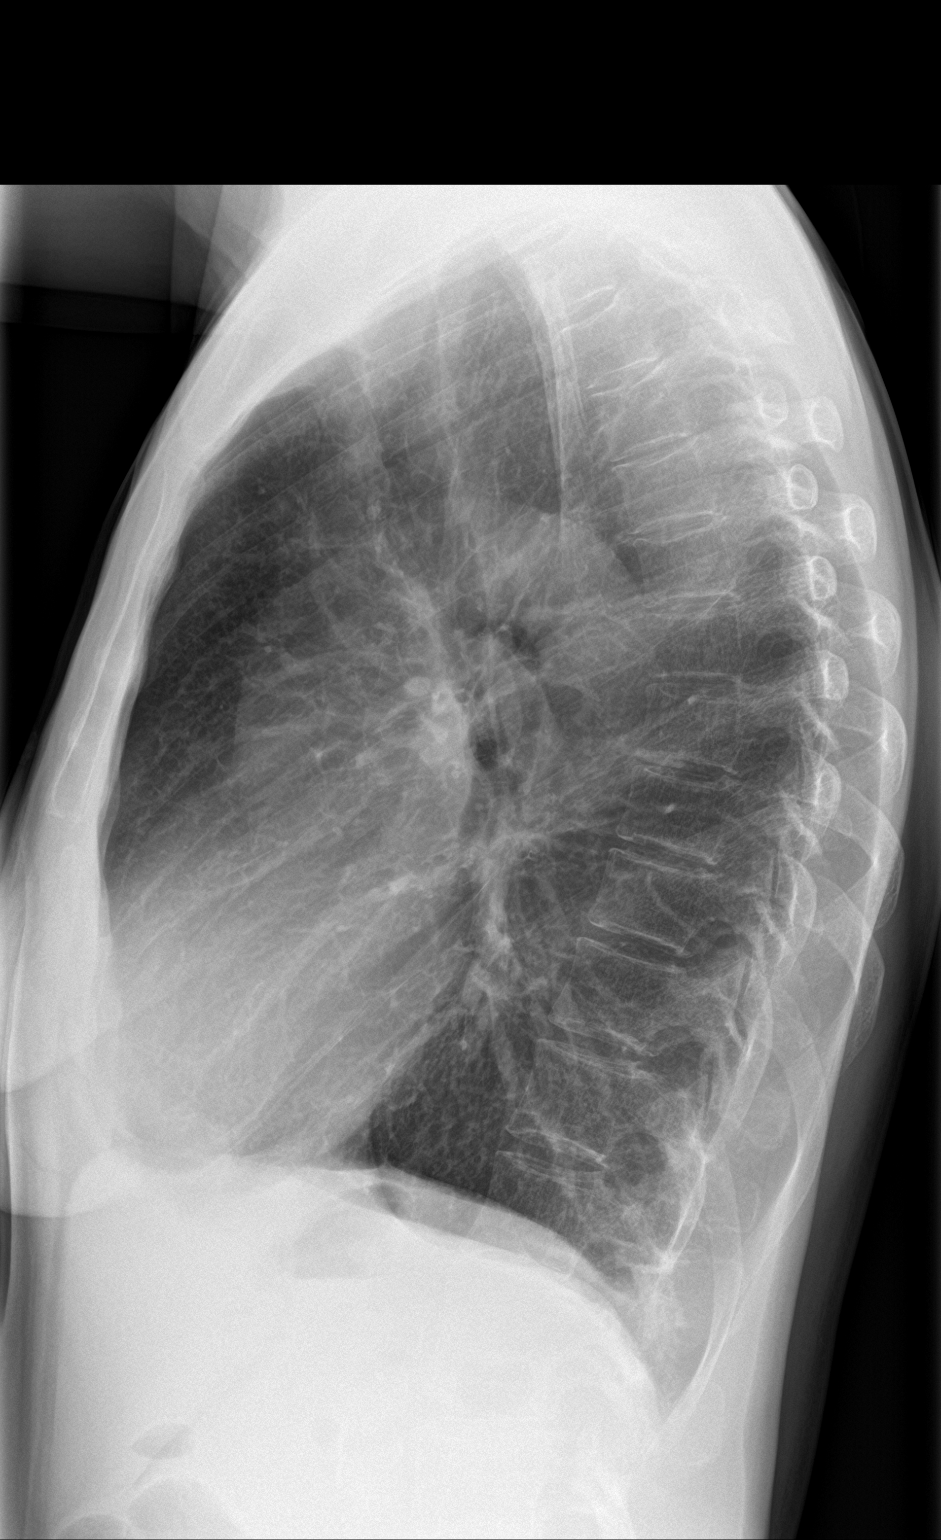

[2 of 2 positions shown; findings below may reference images not displayed]

FINDINGS: Lungs are clear. Heart size and pulmonary vascularity are normal. No
adenopathy. No bone lesions.
IMPRESSION: Lungs clear.  Cardiac silhouette normal.

## 2021-03-30 MED ORDER — GABAPENTIN 100 MG PO CAPS: 100.0000 mg | ORAL_CAPSULE | Freq: Three times a day (TID) | ORAL | 2 refills | Status: DC

## 2021-03-30 MED ORDER — GUAIFENESIN-CODEINE 100-10 MG/5ML PO SOLN: 5.0000 mL | Freq: Three times a day (TID) | ORAL | 0 refills | Status: DC | PRN

## 2021-03-30 NOTE — Progress Notes (Signed)
CC: cough  HPI:  Janice Williams is a 60 y.o. with a past medical history listed below presenting for evaluation of her cough. For details of today's visit and the status of his chronic medical issues please refer to the assessment and plan.   Past Medical History:  Diagnosis Date  . Allergy   . Anemia   . Arthritis    lower back, knees  . Bronchitis   . COPD (chronic obstructive pulmonary disease) ([place])   . COPD with acute exacerbation ([place]) 06/09/2017  . Dental abscess 03/15/2015  . DYSPEPSIA 02/14/2007   Qualifier: Diagnosis of  By: Samara Snide    . EXTERNAL OTITIS 01/06/2010   Qualifier: Diagnosis of  By: Tomma Lightning MD, Claiborne Billings    . FACIAL RASH 02/04/2009   Qualifier: Diagnosis of  By: Rhunette Croft    . Former smoker    quit 2014  . GERD (gastroesophageal reflux disease)   . HIV infection ([place])   . Hypertension   . Stroke ([place])   . 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 SVD (spontaneous vaginal delivery)    x 3   Review of Systems:   Review of Systems  Constitutional: Negative for chills, fever and malaise/fatigue.  HENT: Positive for congestion. Negative for ear pain, sinus pain and sore throat.   Eyes: Negative for pain, discharge and redness.  Respiratory: Positive for cough, sputum production, shortness of breath and wheezing.   Cardiovascular: Negative for chest pain and leg swelling.  Gastrointestinal: Negative for abdominal pain, nausea and vomiting.  Endo/Heme/Allergies: Positive for environmental allergies.     Physical Exam:  Vitals:   03/30/21 1605 03/30/21 1610  BP: (!) 137/92 (!) 128/95  Pulse: 92   Temp: 98.2 F (36.8 C)   TempSrc: Oral   SpO2: 100%   Weight: 104 lb 4.8 oz (47.3 kg)   Height: 5\' 3"  (1.6 m)     Physical Exam Vitals reviewed.  Constitutional:      General: She is not in acute distress.    Appearance: Normal appearance. She is not ill-appearing.  HENT:     Mouth/Throat:     Mouth: Mucous membranes are moist.      Pharynx: Oropharynx is clear. No oropharyngeal exudate or posterior oropharyngeal erythema.  Eyes:     Extraocular Movements: Extraocular movements intact.     Conjunctiva/sclera: Conjunctivae normal.  Cardiovascular:     Rate and Rhythm: Normal rate and regular rhythm.     Pulses: Normal pulses.     Heart sounds: Normal heart sounds. No murmur heard. No friction rub. No gallop.   Pulmonary:     Effort: Pulmonary effort is normal. No respiratory distress.     Breath sounds: Wheezing present. No rales.     Comments: Minimal expiratory wheezing in the left upper lung field Abdominal:     General: Abdomen is flat. Bowel sounds are normal. There is no distension.     Palpations: Abdomen is soft.     Tenderness: There is no abdominal tenderness. There is no guarding.  Musculoskeletal:        General: No swelling or tenderness.     Right lower leg: No edema.     Left lower leg: No edema.  Skin:    General: Skin is warm and dry.  Neurological:     Mental Status: She is alert and oriented to person, place, and time.  Psychiatric:        Mood  and Affect: Mood normal.        Behavior: Behavior normal.        Thought Content: Thought content normal.        Judgment: Judgment normal.     Assessment & Plan:   See Encounters Tab for problem based charting.  Patient discussed with Dr. Dareen Piano

## 2021-03-30 NOTE — Assessment & Plan Note (Addendum)
Patient reports productive cough and congestion for the past 3 to 4 days.  States her sputum production was clear first and turned greenish about 2 days ago.  She denies any fevers, chills, sinus pain, itchy or watery eyes.  She states that she has nasal congestion and pain in her chest when she coughs.  Denies any recent travel or sick contacts.  She is vaccinated against Covid x3.  She states that this feels similar to an upper respiratory infection and COPD flare that she has had in the past.  She has been taking Tessalon Perles with some improvement in her cough.  She states that she has severe environmental allergies, getting weekly allergy shots currently.  She also takes montelukast daily.  There is minimal wheezing in the left upper lung field.  Otherwise lung exam was unremarkable.  HEENT exam also unremarkable.  She has a pretty significant wet cough on exam.  Plan to obtain a Covid/flu swab today follows chest x-ray.  She is on Biktarvy and has a history of HIV risk of infection.  This most likely consistent with a COPD flare although she has minimal wheezing on exam.  Will wait on chest x-ray to rule out infectious process.  Also send in a prescription for Tussionex to help alleviate the cough.  Plan: Follow-up Covid swab and chest x-ray Tussionex as needed for cough

## 2021-03-30 NOTE — Patient Instructions (Signed)
It was a meeting you today.  I have ordered a chest x-ray Covid and flu test as well as cough syrup for you.  I have also sent in your refills.  I will call you with the results of your x-ray and Covid test tomorrow.  It is likely that you are having a COPD flare.  Pending your results I will possibly call in an antibiotic.

## 2021-03-30 NOTE — Assessment & Plan Note (Signed)
Patient requesting a refill on her tramadol today.  She is chronic left knee pain due to osteoarthritis.  She also has mild degenerative disc disease in the cervical spine.  States all this limits her ambulation.  Left a detailed voicemail message that I will hold off on sending a refill the tramadol while she is on the tussionex cough syrup.  Plan: Refill tramadol 50 mg twice daily after patient has completed Tussionex cough syrup treatment for acute illness

## 2021-03-31 ENCOUNTER — Other Ambulatory Visit (HOSPITAL_COMMUNITY): Payer: Self-pay

## 2021-03-31 ENCOUNTER — Telehealth: Payer: Self-pay

## 2021-03-31 DIAGNOSIS — M1990 Unspecified osteoarthritis, unspecified site: Secondary | ICD-10-CM | POA: Diagnosis not present

## 2021-03-31 LAB — NOVEL CORONAVIRUS, NAA: SARS-CoV-2, NAA: NOT DETECTED

## 2021-03-31 LAB — SARS-COV-2, NAA 2 DAY TAT

## 2021-03-31 MED ORDER — PREDNISONE 20 MG PO TABS: 40.0000 mg | ORAL_TABLET | Freq: Every day | ORAL | 0 refills | Status: AC

## 2021-03-31 MED ORDER — AZITHROMYCIN 250 MG PO TABS: ORAL_TABLET | ORAL | 0 refills | Status: AC

## 2021-03-31 NOTE — Telephone Encounter (Signed)
I will call her after my next patient, thanks!

## 2021-03-31 NOTE — Telephone Encounter (Signed)
Requesting to speak with a nurse about lab results and meds. Please call back.

## 2021-03-31 NOTE — Addendum Note (Signed)
Addended by: Mike Craze on: 03/31/2021 04:48 PM   Modules accepted: Orders

## 2021-03-31 NOTE — Telephone Encounter (Signed)
Return pt's call - stated she waiting to hear from the doctor about the CXR. And [facility] did not have guaifenesin-codeine; they would have to order it. Stated her chest still feels tight. Stated she does want to go to UC since she saw the doctor yesterday.Informed pt the doctor has been seeing pts this am; I will send her this message.

## 2021-04-01 DIAGNOSIS — M1990 Unspecified osteoarthritis, unspecified site: Secondary | ICD-10-CM | POA: Diagnosis not present

## 2021-04-04 ENCOUNTER — Telehealth: Payer: Self-pay | Admitting: Internal Medicine

## 2021-04-04 DIAGNOSIS — Z7689 Persons encountering health services in other specified circumstances: Secondary | ICD-10-CM | POA: Diagnosis not present

## 2021-04-04 DIAGNOSIS — M25562 Pain in left knee: Secondary | ICD-10-CM

## 2021-04-04 DIAGNOSIS — H9313 Tinnitus, bilateral: Secondary | ICD-10-CM | POA: Diagnosis not present

## 2021-04-04 DIAGNOSIS — H9203 Otalgia, bilateral: Secondary | ICD-10-CM | POA: Diagnosis not present

## 2021-04-04 DIAGNOSIS — R42 Dizziness and giddiness: Secondary | ICD-10-CM | POA: Diagnosis not present

## 2021-04-04 DIAGNOSIS — M2669 Other specified disorders of temporomandibular joint: Secondary | ICD-10-CM | POA: Diagnosis not present

## 2021-04-04 DIAGNOSIS — G8929 Other chronic pain: Secondary | ICD-10-CM

## 2021-04-04 DIAGNOSIS — H903 Sensorineural hearing loss, bilateral: Secondary | ICD-10-CM | POA: Diagnosis not present

## 2021-04-04 NOTE — Telephone Encounter (Signed)
REFILL REQUEST    traMADol (ULTRAM) 50 MG   [facility] [place], [place] AT [facility] [address]  [place], [place] [address]    Phone:  [phone] Fax:  [phone]

## 2021-04-04 NOTE — Progress Notes (Signed)
Internal Medicine Clinic Attending ° °Case discussed with Dr. Rehman  At the time of the visit.  We reviewed the resident’s history and exam and pertinent patient test results.  I agree with the assessment, diagnosis, and plan of care documented in the resident’s note.  ° °

## 2021-04-05 ENCOUNTER — Other Ambulatory Visit (HOSPITAL_COMMUNITY): Payer: Self-pay

## 2021-04-05 DIAGNOSIS — M1990 Unspecified osteoarthritis, unspecified site: Secondary | ICD-10-CM | POA: Diagnosis not present

## 2021-04-05 MED ORDER — TRAMADOL HCL 50 MG PO TABS: 50.0000 mg | ORAL_TABLET | Freq: Two times a day (BID) | ORAL | 0 refills | Status: AC | PRN

## 2021-04-05 MED FILL — Bictegravir-Emtricitabine-Tenofovir AF Tab 50-200-25 MG: ORAL | 30 days supply | Qty: 30 | Fill #0 | Status: AC

## 2021-04-05 NOTE — Addendum Note (Signed)
Addended by: Lyndal Pulley on: 04/05/2021 02:30 PM   Modules accepted: Orders

## 2021-04-05 NOTE — Telephone Encounter (Signed)
Will send in a short course of tramadol, thanks!

## 2021-04-05 NOTE — Telephone Encounter (Signed)
Called pt about her cough and Tussionex. Stated she finished taking Tussionex and no longer has a cough.

## 2021-04-05 NOTE — Telephone Encounter (Signed)
Tramadol refilled. This is prescribed for chronic knee and cervical spine pain. Last Toxassure 01/08/2020. Patient will need a visit before in the next month for UDS. PDMP reviewed an appropriate.

## 2021-04-05 NOTE — Addendum Note (Signed)
Addended by: Mike Craze on: 04/05/2021 12:48 PM   Modules accepted: Orders

## 2021-04-05 NOTE — Telephone Encounter (Signed)
Pt called / informed the doctor will send a short course of Tramadol and th check with pharmacy later today. Pt stated "ok".

## 2021-04-05 NOTE — Addendum Note (Signed)
Addended by: Mike Craze on: 04/05/2021 08:53 AM   Modules accepted: Level of Service

## 2021-04-05 NOTE — Telephone Encounter (Signed)
Pls contact pt regarding medicine [phone]

## 2021-04-05 NOTE — Telephone Encounter (Signed)
Did not want to start this medication while patient was taking tussionex cough syrup. Is she still taking cough syrup? Thanks

## 2021-04-05 NOTE — Telephone Encounter (Signed)
Will have an attending help me send this script in. I am not authorized for e-prescribing controlled substances still.

## 2021-04-06 DIAGNOSIS — M1990 Unspecified osteoarthritis, unspecified site: Secondary | ICD-10-CM | POA: Diagnosis not present

## 2021-04-07 ENCOUNTER — Telehealth: Payer: Self-pay | Admitting: Internal Medicine

## 2021-04-07 DIAGNOSIS — M1990 Unspecified osteoarthritis, unspecified site: Secondary | ICD-10-CM | POA: Diagnosis not present

## 2021-04-07 MED ORDER — AMOXICILLIN-POT CLAVULANATE 875-125 MG PO TABS: 1.0000 | ORAL_TABLET | Freq: Two times a day (BID) | ORAL | 0 refills | Status: DC

## 2021-04-07 NOTE — Telephone Encounter (Signed)
Called and spoke with pt letting her know the recs stated by MW and she verbalized understanding. Verified preferred pharmacy and sent Rx in for pt.nothing further needed.

## 2021-04-07 NOTE — Telephone Encounter (Signed)
Spoke with pt  She is c/o chest congestion x 2 wks  PCP gave Zpack a wk ago and this did not help  She is coughing up light green sputum despite zpack  She denies any f/c/s, aches, SOB, wheezing  She is still on pred taper that was also given by PCP  Wants a different abx to see if that would help  She is still on her symbicort 80, albuterol prn (about 2 x per wk), singulair, zyrtec, tessalon  Has had covid vax x 3 and also tested neg 5 days ago Please advise thanks Allergies  Allergen Reactions  . Chantix [Varenicline] Itching

## 2021-04-07 NOTE — Telephone Encounter (Signed)
Augmentin 875 mg take one pill twice daily  X 10 days - take at breakfast and supper with large glass of water.  It would help reduce the usual side effects (diarrhea and yeast infections) if you ate cultured yogurt at lunch.   If not better will need to be seen early next week  - if getting worse will need to go to ER

## 2021-04-08 DIAGNOSIS — M1990 Unspecified osteoarthritis, unspecified site: Secondary | ICD-10-CM | POA: Diagnosis not present

## 2021-04-09 DIAGNOSIS — M1990 Unspecified osteoarthritis, unspecified site: Secondary | ICD-10-CM | POA: Diagnosis not present

## 2021-04-10 DIAGNOSIS — M1990 Unspecified osteoarthritis, unspecified site: Secondary | ICD-10-CM | POA: Diagnosis not present

## 2021-04-11 ENCOUNTER — Other Ambulatory Visit (HOSPITAL_COMMUNITY): Payer: Self-pay

## 2021-04-11 DIAGNOSIS — M1990 Unspecified osteoarthritis, unspecified site: Secondary | ICD-10-CM | POA: Diagnosis not present

## 2021-04-12 ENCOUNTER — Telehealth: Payer: Self-pay | Admitting: Internal Medicine

## 2021-04-12 ENCOUNTER — Other Ambulatory Visit (HOSPITAL_COMMUNITY): Payer: Self-pay

## 2021-04-12 ENCOUNTER — Other Ambulatory Visit: Payer: Self-pay | Admitting: *Deleted

## 2021-04-12 DIAGNOSIS — B20 Human immunodeficiency virus [HIV] disease: Secondary | ICD-10-CM

## 2021-04-12 DIAGNOSIS — M1990 Unspecified osteoarthritis, unspecified site: Secondary | ICD-10-CM | POA: Diagnosis not present

## 2021-04-12 NOTE — Telephone Encounter (Signed)
I called the pt and she stated that the abx that she took helped her a lot and she is feeling much better.  She stated that she had to stop taking the prednisone due to it causing her to shake.  She is worried since she cannot get the phlegm out of her chest.  She has not been using any OTC meds like mucinex.  She wanted to check with MW to see what else she could try.  MW please advise.   You only have hold slots on your schedule if you want her to come in for a visit.  Thanks  . Allergies  Allergen Reactions  . Chantix [Varenicline] Itching

## 2021-04-12 NOTE — Telephone Encounter (Signed)
Next appt scheduled 5/4 with PCP.

## 2021-04-13 DIAGNOSIS — M1990 Unspecified osteoarthritis, unspecified site: Secondary | ICD-10-CM | POA: Diagnosis not present

## 2021-04-13 MED ORDER — MEGESTROL ACETATE 40 MG PO TABS: 40.0000 mg | ORAL_TABLET | Freq: Every day | ORAL | 1 refills | Status: DC

## 2021-04-13 NOTE — Telephone Encounter (Signed)
LMTCB- needs to be scheduled ov with MW and advised to bring all meds in hand including OTC meds- ok for blocked spot per MW.

## 2021-04-13 NOTE — Telephone Encounter (Signed)
Looks like patient has been scheduled with [name] for next week and [name] in September. Nothing further needed at this time.   Next Appt With Pulmonology [name], NP) 04/18/2021 at 10:30 AM

## 2021-04-13 NOTE — Telephone Encounter (Signed)
Yes ok to [place] hold spot with all active meds in hand including otcs

## 2021-04-14 DIAGNOSIS — M1990 Unspecified osteoarthritis, unspecified site: Secondary | ICD-10-CM | POA: Diagnosis not present

## 2021-04-14 DIAGNOSIS — Z7689 Persons encountering health services in other specified circumstances: Secondary | ICD-10-CM | POA: Diagnosis not present

## 2021-04-15 DIAGNOSIS — M1990 Unspecified osteoarthritis, unspecified site: Secondary | ICD-10-CM | POA: Diagnosis not present

## 2021-04-16 DIAGNOSIS — M1990 Unspecified osteoarthritis, unspecified site: Secondary | ICD-10-CM | POA: Diagnosis not present

## 2021-04-17 DIAGNOSIS — M1990 Unspecified osteoarthritis, unspecified site: Secondary | ICD-10-CM | POA: Diagnosis not present

## 2021-04-17 DIAGNOSIS — Z419 Encounter for procedure for purposes other than remedying health state, unspecified: Secondary | ICD-10-CM | POA: Diagnosis not present

## 2021-04-18 ENCOUNTER — Encounter: Payer: Medicaid Other | Admitting: Internal Medicine

## 2021-04-18 ENCOUNTER — Ambulatory Visit: Payer: Medicaid Other | Admitting: Adult Health

## 2021-04-18 DIAGNOSIS — M1990 Unspecified osteoarthritis, unspecified site: Secondary | ICD-10-CM | POA: Diagnosis not present

## 2021-04-19 DIAGNOSIS — J3089 Other allergic rhinitis: Secondary | ICD-10-CM | POA: Diagnosis not present

## 2021-04-19 DIAGNOSIS — M1990 Unspecified osteoarthritis, unspecified site: Secondary | ICD-10-CM | POA: Diagnosis not present

## 2021-04-19 DIAGNOSIS — J301 Allergic rhinitis due to pollen: Secondary | ICD-10-CM | POA: Diagnosis not present

## 2021-04-20 ENCOUNTER — Encounter: Payer: Medicaid Other | Admitting: Internal Medicine

## 2021-04-20 DIAGNOSIS — M1990 Unspecified osteoarthritis, unspecified site: Secondary | ICD-10-CM | POA: Diagnosis not present

## 2021-04-21 ENCOUNTER — Other Ambulatory Visit: Payer: Self-pay

## 2021-04-21 ENCOUNTER — Telehealth: Payer: Self-pay | Admitting: Adult Health

## 2021-04-21 ENCOUNTER — Ambulatory Visit (INDEPENDENT_AMBULATORY_CARE_PROVIDER_SITE_OTHER): Payer: Medicaid Other | Admitting: Adult Health

## 2021-04-21 ENCOUNTER — Ambulatory Visit (INDEPENDENT_AMBULATORY_CARE_PROVIDER_SITE_OTHER): Payer: Medicaid Other

## 2021-04-21 ENCOUNTER — Encounter: Payer: Self-pay | Admitting: Adult Health

## 2021-04-21 VITALS — BP 118/74 | HR 110 | Temp 98.0°F | Ht 63.0 in | Wt 101.0 lb

## 2021-04-21 DIAGNOSIS — R059 Cough, unspecified: Secondary | ICD-10-CM | POA: Diagnosis not present

## 2021-04-21 DIAGNOSIS — J449 Chronic obstructive pulmonary disease, unspecified: Secondary | ICD-10-CM

## 2021-04-21 IMAGING — DX DG CHEST 2V
2 series · 2 of 2 positions shown · non-contrast
Comparison: [DATE]

CLINICAL DATA: Cough and congestion

EXAM:
CHEST - 2 VIEW

[chest pa]
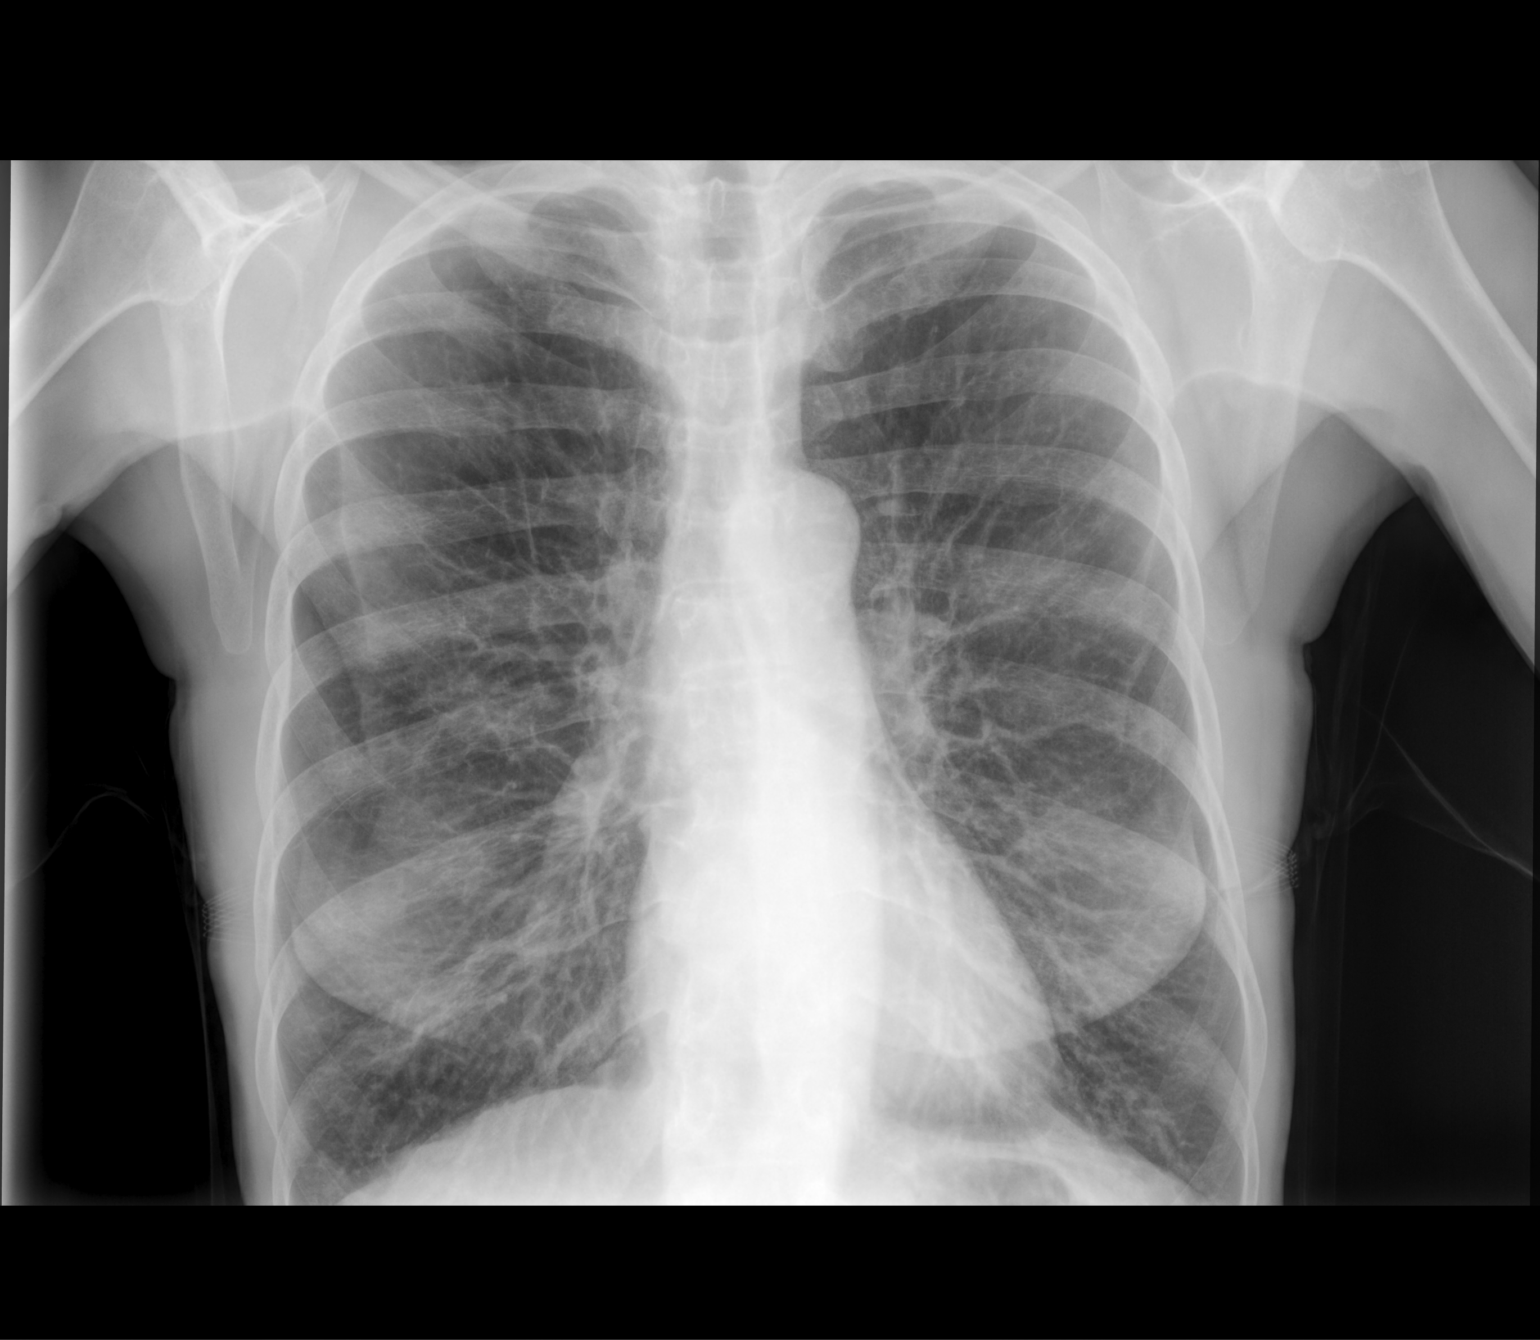

[chest lat]
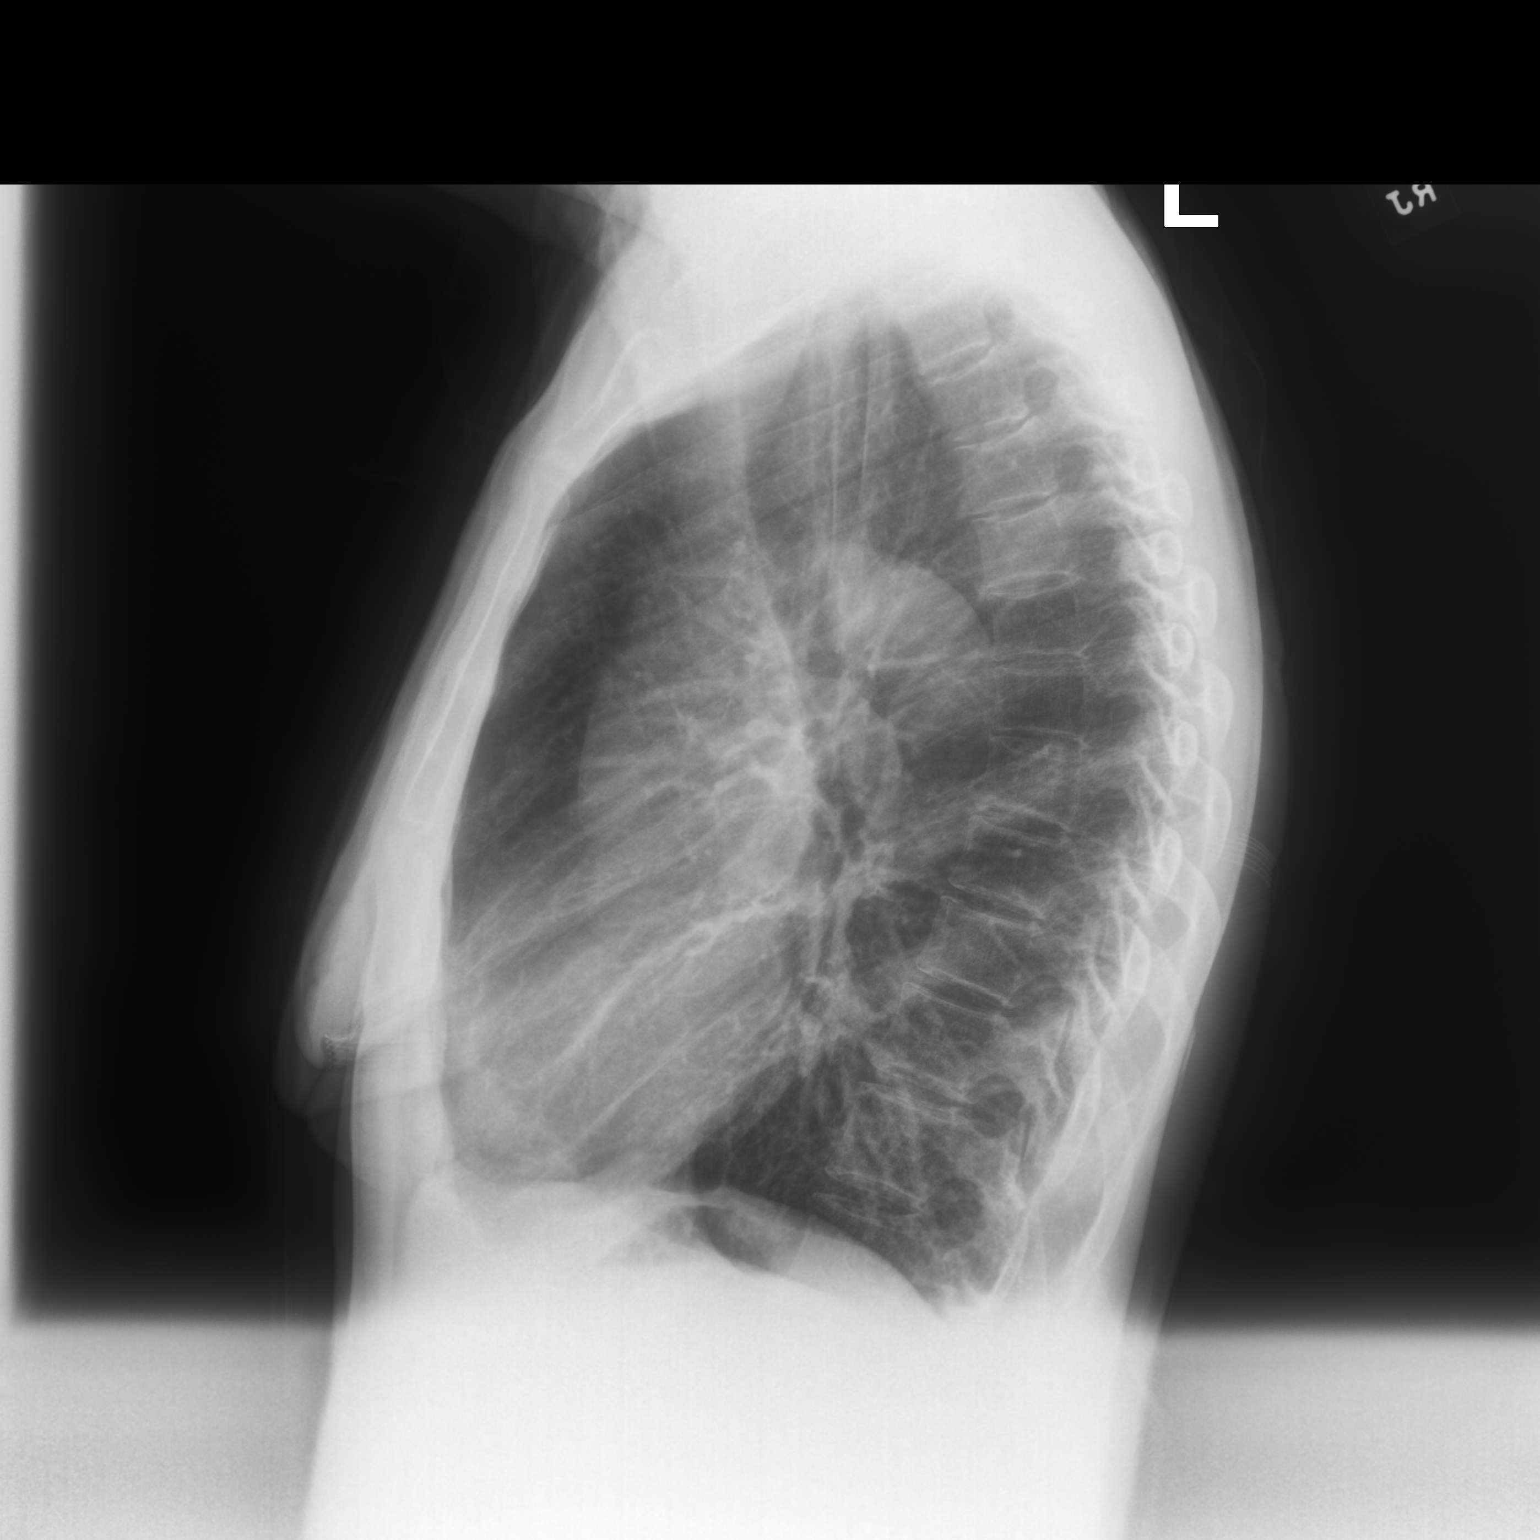

[2 of 2 positions shown; findings below may reference images not displayed]

FINDINGS: Cardiac shadow is within normal limits. The lungs are hyperinflated
bilaterally and stable. No focal infiltrate or sizable effusion is
seen. No acute bony abnormality is noted.
IMPRESSION: COPD without acute abnormality.

## 2021-04-21 MED ORDER — BENZONATATE 200 MG PO CAPS: 200.0000 mg | ORAL_CAPSULE | Freq: Three times a day (TID) | ORAL | 1 refills | Status: DC | PRN

## 2021-04-21 MED ORDER — BREZTRI AEROSPHERE 160-9-4.8 MCG/ACT IN AERO: 2.0000 | INHALATION_SPRAY | Freq: Two times a day (BID) | RESPIRATORY_TRACT | 0 refills | Status: DC

## 2021-04-21 MED ORDER — METHYLPREDNISOLONE 4 MG PO TABS: ORAL_TABLET | ORAL | 0 refills | Status: DC

## 2021-04-21 NOTE — Assessment & Plan Note (Addendum)
Slow to resolve COPD exacerbation. Hold on additional antibiotics this time.  Try a low-dose Medrol burst. Sample of BREZTRI for 1 month. Add flutter valve. Check chest x-ray and sputum culture. Needs PFTs on return  Plan  Patient Instructions  Chest xray today .  Check sputum culture.  Continue on Zyrtec, Xyzal and Singulair  Mucinex Twice daily  As needed  Cough/congestion .  Begin Flutter valve Three times a day   Delsym 2 tsp Twice daily  For cough As needed   Tessalon Three times a day  For cough As needed   Medrol 8mg  daily for 3 days then 4mg  daily for 3  days and stop .  BREZTRI 2 puffs Twice daily  Until sample is gone then resume Symbicort 2 puffs Twice daily  . (rinse after use)  Follow up in 4- 6 weeks with PFT with [name]  And As needed   Please contact office for sooner follow up if symptoms do not improve or worsen or seek emergency care

## 2021-04-21 NOTE — Addendum Note (Signed)
Addended by: Vanessa Barbara on: 04/21/2021 03:47 PM   Modules accepted: Orders

## 2021-04-21 NOTE — Progress Notes (Signed)
@Patient  ID: Janice Williams, female    DOB: July 12, 1961, 60 y.o.   MRN: [id number]  Chief Complaint  Patient presents with  . Follow-up    Referring provider: Madalyn Rob, MD  HPI: 60 year old former smoker followed for COPD Medical history significant for HIV, chronic pain syndrome, hypertension  TEST/EVENTS :   04/21/2021 Follow up : COPD  Patient presents for an acute office visit.  Patient complains over the last 4 weeks that she has had ongoing cough congestion with thick mucus.  She was called in Augmentin for 10 days 2 weeks ago.  Prior to this she took a Z-Pak.  She is feeling better but continues to have an ongoing cough that will not stop.  Cough is quite congested with thick mucus.  Mainly clear.  She says overall she feels fine with no nausea vomiting diarrhea.  Appetite is good.  She has no fever or loss of taste or smell. Patient has been fully vaccinated for COVID-19.  She did take a home COVID test initially that was negative. She remains on Symbicort twice daily. Chest x-ray 2 weeks ago showed clear lungs. CT sinus in March was negative. Was given prednisone but could not tolerate .   Patient is seen by the allergist and is on allergy shots.  Allergies  Allergen Reactions  . Chantix [Varenicline] Itching    Immunization History  Administered Date(s) Administered  . Hepatitis B 06/01/2008, 09/10/2008, 02/04/2009  . Influenza Split 09/19/2012  . Influenza Whole 09/18/2008, 09/12/2010, 08/17/2011  . Influenza, High Dose Seasonal PF 01/18/2021  . Influenza, Seasonal, Injecte, Preservative Fre 09/17/2017  . Influenza,inj,Quad PF,6+ Mos 10/21/2013, 08/11/2014, 10/26/2015, 09/16/2017, 12/16/2018, 08/27/2019, 09/10/2020  . Influenza-Unspecified 09/18/2016  . Moderna Sars-Covid-2 Vaccination 03/05/2020, 04/05/2020, 08/04/2020  . Pneumococcal Conjugate-13 07/10/2018  . Pneumococcal Polysaccharide-23 06/01/2008, 10/21/2013, 07/30/2020, 01/18/2021  . Td 05/18/1998  .  Tdap 06/06/2012    Past Medical History:  Diagnosis Date  . Allergy   . Anemia   . Arthritis    lower back, knees  . Bronchitis   . COPD (chronic obstructive pulmonary disease) ([place])   . COPD with acute exacerbation ([place]) 06/09/2017  . Dental abscess 03/15/2015  . DYSPEPSIA 02/14/2007   Qualifier: Diagnosis of  By: Samara Snide    . EXTERNAL OTITIS 01/06/2010   Qualifier: Diagnosis of  By: Tomma Lightning MD, Claiborne Billings    . FACIAL RASH 02/04/2009   Qualifier: Diagnosis of  By: Rhunette Croft    . Former smoker    quit 2014  . GERD (gastroesophageal reflux disease)   . HIV infection ([place])   . Hypertension   . Stroke ([place])   . 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 SVD (spontaneous vaginal delivery)    x 3    Tobacco History: Social History   Tobacco Use  Smoking Status Former Smoker  . Packs/day: 0.40  . Years: 38.00  . Pack years: 15.20  . Types: Cigarettes  . Start date: 12/18/2012  . Quit date: 11/21/2013  . Years since quitting: 7.4  Smokeless Tobacco Never Used   Counseling given: Not Answered   Outpatient Medications Prior to Visit  Medication Sig Dispense Refill  . albuterol (VENTOLIN HFA) 108 (90 Base) MCG/ACT inhaler Inhale 2 puffs into the lungs every 6 (six) hours as needed for wheezing or shortness of breath.    [name] amLODipine (NORVASC) 10 MG tablet TAKE 1 TABLET(10 MG) BY MOUTH DAILY 90 tablet 1  . bictegravir-emtricitabine-tenofovir AF (  BIKTARVY) 50-200-25 MG TABS tablet TAKE 1 TABLET BY MOUTH DAILY. 30 tablet 4  . BIKTARVY 50-200-25 MG TABS tablet TAKE 1 TABLET BY MOUTH DAILY. 30 tablet 4  . budesonide-formoterol (SYMBICORT) 80-4.5 MCG/ACT inhaler TAKE 2 PUFFS FIRST THING IN THE MORNING AND THEN ANOTHER 2 PUFFS 12 HOURS LATER 10.2 g 5  . Ensure (ENSURE) Take 237 mLs by mouth 3 (three) times daily between meals. 237 mL 5  . fluticasone (FLONASE) 50 MCG/ACT nasal spray Place 1 spray into both nostrils daily. 16 g 0  . levocetirizine (XYZAL) 5 MG tablet Take  5 mg by mouth every evening.    . megestrol (MEGACE) 40 MG tablet Take 1 tablet (40 mg total) by mouth daily. 30 tablet 1  . montelukast (SINGULAIR) 10 MG tablet TAKE 1 TABLET(10 MG) BY MOUTH AT BEDTIME 90 tablet 3  . naproxen (NAPROSYN) 500 MG tablet Take 1 tablet (500 mg total) by mouth 2 (two) times daily with a meal. 14 tablet 0  . omeprazole (PRILOSEC) 20 MG capsule Take 1 capsule (20 mg total) by mouth daily. 90 capsule 5  . polyethylene glycol (MIRALAX / GLYCOLAX) packet Take one capful in 8 oz of fluid of your choice twice a day until having soft stools, then back down to once a day.  If not having soft stools with twice a day, increased to three times a day (Patient taking differently: Take 17 g by mouth 2 (two) times daily.) 100 each 0  . pravastatin (PRAVACHOL) 20 MG tablet Take 1 tablet (20 mg total) by mouth daily. 30 tablet 5  . PRESCRIPTION MEDICATION Inject 1 Dose into the skin once a week. Allergy shot in office on Tuesdays    . traMADol (ULTRAM) 50 MG tablet Take 1 tablet (50 mg total) by mouth every 12 (twelve) hours as needed for moderate pain. 60 tablet 0  . amoxicillin-clavulanate (AUGMENTIN) 875-125 MG tablet Take 1 tablet by mouth 2 (two) times daily. Take with a large glass of water at breakfast and supper. (Patient not taking: Reported on 04/21/2021) 20 tablet 0  . benzonatate (TESSALON) 100 MG capsule Take 1 capsule (100 mg total) by mouth every 8 (eight) hours. (Patient not taking: Reported on 04/21/2021) 21 capsule 0  . calcium citrate-vitamin D (CITRACAL+D) 315-200 MG-UNIT tablet Take 2 tablets by mouth 2 (two) times daily. (Patient not taking: No sig reported) 120 tablet 11  . cetirizine (ZYRTEC ALLERGY) 10 MG tablet Take 1 tablet (10 mg total) by mouth daily. (Patient not taking: Reported on 04/21/2021) 30 tablet 0  . diclofenac Sodium (VOLTAREN) 1 % GEL Apply 2 g topically 4 (four) times daily. (Patient not taking: Reported on 04/21/2021) 50 g 1  . gabapentin (NEURONTIN) 100 MG  capsule Take 1 capsule (100 mg total) by mouth 3 (three) times daily. (Patient not taking: Reported on 04/21/2021) 30 capsule 2   No facility-administered medications prior to visit.     Review of Systems:   Constitutional:   No  weight loss, night sweats,  Fevers, chills, +fatigue, or  lassitude.  HEENT:   No headaches,  Difficulty swallowing,  Tooth/dental problems, or  Sore throat,                No sneezing, itching, ear ache, +nasal congestion, post nasal drip,   CV:  No chest pain,  Orthopnea, PND, swelling in lower extremities, anasarca, dizziness, palpitations, syncope.   GI  No heartburn, indigestion, abdominal pain, nausea, vomiting, diarrhea, change in bowel habits,  loss of appetite, bloody stools.   Resp:   No chest wall deformity  Skin: no rash or lesions.  GU: no dysuria, change in color of urine, no urgency or frequency.  No flank pain, no hematuria   MS:  No joint pain or swelling.  No decreased range of motion.  No back pain.    Physical Exam  BP 118/74 (BP Location: Left Arm, Patient Position: Sitting, Cuff Size: Normal)   Pulse (!) 110   Temp 98 F (36.7 C) (Temporal)   Ht 5\' 3"  (1.6 m)   Wt 101 lb (45.8 kg)   SpO2 99%   BMI 17.89 kg/m   GEN: A/Ox3; pleasant , NAD, well nourished    HEENT:  [place]/AT,    NOSE-clear, THROAT-clear, no lesions, no postnasal drip or exudate noted.   NECK:  Supple w/ fair ROM; no JVD; normal carotid impulses w/o bruits; no thyromegaly or nodules palpated; no lymphadenopathy.    RESP few scattered rhonchi  no accessory muscle use, no dullness to percussion  CARD:  RRR, no m/r/g, no peripheral edema, pulses intact, no cyanosis or clubbing.  GI:   Soft & nt; nml bowel sounds; no organomegaly or masses detected.   Musco: Warm bil, no deformities or joint swelling noted.   Neuro: alert, no focal deficits noted.    Skin: Warm, no lesions or rashes    Lab Results:  BMET   ProBNP No results found for:  PROBNP  Imaging: DG Chest 2 View  Result Date: 03/31/2021 CLINICAL DATA:  Cough EXAM: CHEST - 2 VIEW COMPARISON:  November 15, 2020 FINDINGS: Lungs are clear. Heart size and pulmonary vascularity are normal. No adenopathy. No bone lesions. IMPRESSION: Lungs clear.  Cardiac silhouette normal. Electronically Signed   By: Lowella Grip III M.D.   On: 03/31/2021 16:52      No flowsheet data found.  No results found for: NITRICOXIDE      Assessment & Plan:   COPD gold ?  Slow to resolve COPD exacerbation. Hold on additional antibiotics this time.  Try a low-dose Medrol burst. Sample of BREZTRI for 1 month. Add flutter valve. Check chest x-ray and sputum culture. Needs PFTs on return  Plan  Patient Instructions  Chest xray today .  Check sputum culture.  Continue on Zyrtec, Xyzal and Singulair  Mucinex Twice daily  As needed  Cough/congestion .  Begin Flutter valve Three times a day   Delsym 2 tsp Twice daily  For cough As needed   Tessalon Three times a day  For cough As needed   Medrol 8mg  daily for 3 days then 4mg  daily for 3  days and stop .  BREZTRI 2 puffs Twice daily  Until sample is gone then resume Symbicort 2 puffs Twice daily  . (rinse after use)  Follow up in 4- 6 weeks with PFT with [name]  And As needed   Please contact office for sooner follow up if symptoms do not improve or worsen or seek emergency care            [name], NP 04/21/2021

## 2021-04-21 NOTE — Patient Instructions (Addendum)
Chest xray today .  Check sputum culture.  Continue on Zyrtec, Xyzal and Singulair  Mucinex Twice daily  As needed  Cough/congestion .  Begin Flutter valve Three times a day   Delsym 2 tsp Twice daily  For cough As needed   Tessalon Three times a day  For cough As needed   Medrol 8mg  daily for 3 days then 4mg  daily for 3  days and stop .  BREZTRI 2 puffs Twice daily  Until sample is gone then resume Symbicort 2 puffs Twice daily  . (rinse after use)  Follow up in 4- 6 weeks with PFT with [name]  And As needed   Please contact office for sooner follow up if symptoms do not improve or worsen or seek emergency care

## 2021-04-22 DIAGNOSIS — M1990 Unspecified osteoarthritis, unspecified site: Secondary | ICD-10-CM | POA: Diagnosis not present

## 2021-04-24 ENCOUNTER — Encounter: Payer: Self-pay | Admitting: *Deleted

## 2021-04-25 DIAGNOSIS — M1990 Unspecified osteoarthritis, unspecified site: Secondary | ICD-10-CM | POA: Diagnosis not present

## 2021-04-25 NOTE — Telephone Encounter (Signed)
Nothing noted in message. Will close encounter.  

## 2021-04-26 DIAGNOSIS — M1990 Unspecified osteoarthritis, unspecified site: Secondary | ICD-10-CM | POA: Diagnosis not present

## 2021-04-27 DIAGNOSIS — M1990 Unspecified osteoarthritis, unspecified site: Secondary | ICD-10-CM | POA: Diagnosis not present

## 2021-04-28 ENCOUNTER — Other Ambulatory Visit: Payer: Self-pay

## 2021-04-28 DIAGNOSIS — R634 Abnormal weight loss: Secondary | ICD-10-CM

## 2021-04-28 MED ORDER — ENSURE PO LIQD: 237.0000 mL | Freq: Three times a day (TID) | ORAL | 5 refills | Status: DC

## 2021-04-29 DIAGNOSIS — Z7689 Persons encountering health services in other specified circumstances: Secondary | ICD-10-CM | POA: Diagnosis not present

## 2021-04-29 DIAGNOSIS — J3089 Other allergic rhinitis: Secondary | ICD-10-CM | POA: Diagnosis not present

## 2021-04-29 DIAGNOSIS — M1990 Unspecified osteoarthritis, unspecified site: Secondary | ICD-10-CM | POA: Diagnosis not present

## 2021-04-29 DIAGNOSIS — J301 Allergic rhinitis due to pollen: Secondary | ICD-10-CM | POA: Diagnosis not present

## 2021-05-02 DIAGNOSIS — M1990 Unspecified osteoarthritis, unspecified site: Secondary | ICD-10-CM | POA: Diagnosis not present

## 2021-05-03 DIAGNOSIS — Z7689 Persons encountering health services in other specified circumstances: Secondary | ICD-10-CM | POA: Diagnosis not present

## 2021-05-03 DIAGNOSIS — M1990 Unspecified osteoarthritis, unspecified site: Secondary | ICD-10-CM | POA: Diagnosis not present

## 2021-05-03 DIAGNOSIS — J301 Allergic rhinitis due to pollen: Secondary | ICD-10-CM | POA: Diagnosis not present

## 2021-05-03 DIAGNOSIS — J3089 Other allergic rhinitis: Secondary | ICD-10-CM | POA: Diagnosis not present

## 2021-05-04 ENCOUNTER — Other Ambulatory Visit: Payer: Self-pay | Admitting: Internal Medicine

## 2021-05-04 DIAGNOSIS — J301 Allergic rhinitis due to pollen: Secondary | ICD-10-CM

## 2021-05-04 MED ORDER — FLUTICASONE PROPIONATE 50 MCG/ACT NA SUSP: 1.0000 | Freq: Every day | NASAL | 3 refills | Status: DC

## 2021-05-04 NOTE — Telephone Encounter (Signed)
Refill Request   fluticasone (FLONASE) 50 MCG/ACT nasal[id number] [place] [facility] [place] - [place] (Ph: [phone]) spray

## 2021-05-06 ENCOUNTER — Other Ambulatory Visit: Payer: Self-pay | Admitting: Internal Medicine

## 2021-05-06 ENCOUNTER — Other Ambulatory Visit (HOSPITAL_COMMUNITY): Payer: Self-pay

## 2021-05-06 DIAGNOSIS — M1990 Unspecified osteoarthritis, unspecified site: Secondary | ICD-10-CM | POA: Diagnosis not present

## 2021-05-06 MED ORDER — BIKTARVY 50-200-25 MG PO TABS
1.0000 | ORAL_TABLET | Freq: Every day | ORAL | 3 refills | Status: DC
  Filled 2021-05-06: qty 30, 30d supply, fill #0
  Filled 2021-05-31: qty 30, 30d supply, fill #1
  Filled 2021-07-11: qty 30, 30d supply, fill #2
  Filled 2021-08-09: qty 30, 30d supply, fill #3

## 2021-05-07 ENCOUNTER — Other Ambulatory Visit (HOSPITAL_COMMUNITY): Payer: Self-pay

## 2021-05-07 DIAGNOSIS — M1990 Unspecified osteoarthritis, unspecified site: Secondary | ICD-10-CM | POA: Diagnosis not present

## 2021-05-08 DIAGNOSIS — M1990 Unspecified osteoarthritis, unspecified site: Secondary | ICD-10-CM | POA: Diagnosis not present

## 2021-05-09 ENCOUNTER — Telehealth: Payer: Self-pay

## 2021-05-09 ENCOUNTER — Other Ambulatory Visit (HOSPITAL_COMMUNITY): Payer: Self-pay

## 2021-05-09 DIAGNOSIS — M1990 Unspecified osteoarthritis, unspecified site: Secondary | ICD-10-CM | POA: Diagnosis not present

## 2021-05-09 NOTE — Telephone Encounter (Signed)
TC to patient, she was informed that she has a refill on her Megace at [facility]. Also informed that per Dr. Lonzo Candy telephone note on 04/04/21, she needs an appt before add'l refills of Tramadol.  Appt made for 05/11/21 @ 1415 w/ Dr. Bridgett Larsson. SChaplin, RN,BSN

## 2021-05-09 NOTE — Telephone Encounter (Signed)
Need refill on tradmol,   megestrol (MEGACE) 40 MG tablet  ;pt contact [place] ([place]), [place] - [place]

## 2021-05-10 DIAGNOSIS — M1990 Unspecified osteoarthritis, unspecified site: Secondary | ICD-10-CM | POA: Diagnosis not present

## 2021-05-11 ENCOUNTER — Encounter: Payer: Medicaid Other | Admitting: Student

## 2021-05-11 DIAGNOSIS — M1990 Unspecified osteoarthritis, unspecified site: Secondary | ICD-10-CM | POA: Diagnosis not present

## 2021-05-12 ENCOUNTER — Other Ambulatory Visit: Payer: Self-pay

## 2021-05-12 ENCOUNTER — Ambulatory Visit (INDEPENDENT_AMBULATORY_CARE_PROVIDER_SITE_OTHER): Payer: Medicaid Other | Admitting: Student

## 2021-05-12 ENCOUNTER — Encounter: Payer: Self-pay | Admitting: Student

## 2021-05-12 VITALS — BP 125/84 | HR 105 | Temp 98.4°F | Ht 63.5 in | Wt 103.5 lb

## 2021-05-12 DIAGNOSIS — K5900 Constipation, unspecified: Secondary | ICD-10-CM | POA: Diagnosis not present

## 2021-05-12 DIAGNOSIS — M1990 Unspecified osteoarthritis, unspecified site: Secondary | ICD-10-CM | POA: Diagnosis not present

## 2021-05-12 DIAGNOSIS — G8929 Other chronic pain: Secondary | ICD-10-CM | POA: Diagnosis not present

## 2021-05-12 DIAGNOSIS — K5903 Drug induced constipation: Secondary | ICD-10-CM

## 2021-05-12 DIAGNOSIS — M25562 Pain in left knee: Secondary | ICD-10-CM

## 2021-05-12 DIAGNOSIS — F1190 Opioid use, unspecified, uncomplicated: Secondary | ICD-10-CM

## 2021-05-12 DIAGNOSIS — Z7689 Persons encountering health services in other specified circumstances: Secondary | ICD-10-CM | POA: Diagnosis not present

## 2021-05-12 MED ORDER — SENNOSIDES-DOCUSATE SODIUM 8.6-50 MG PO TABS: 1.0000 | ORAL_TABLET | Freq: Every day | ORAL | 3 refills | Status: DC

## 2021-05-12 MED ORDER — TRAMADOL HCL 50 MG PO TABS: 50.0000 mg | ORAL_TABLET | Freq: Two times a day (BID) | ORAL | 0 refills | Status: DC | PRN

## 2021-05-12 NOTE — Telephone Encounter (Signed)
[facility] states Dr Lianne Moris DEA# will not go thru`; I will ask The Attending to re-send Tramadol rx. Thanks

## 2021-05-12 NOTE — Progress Notes (Signed)
   CC: Constipation, knee pain  HPI:  Janice Williams is a 60 y.o. female with history as below presenting for follow-up on the above. Please refer to problem based charting for further details of assessment and plan of current problem and chronic medical conditions.  Past Medical History:  Diagnosis Date  . Allergy   . Anemia   . Arthritis    lower back, knees  . Bronchitis   . COPD (chronic obstructive pulmonary disease) ([place])   . COPD with acute exacerbation ([place]) 06/09/2017  . Dental abscess 03/15/2015  . DYSPEPSIA 02/14/2007   Qualifier: Diagnosis of  By: Samara Snide    . EXTERNAL OTITIS 01/06/2010   Qualifier: Diagnosis of  By: Tomma Lightning MD, Claiborne Billings    . FACIAL RASH 02/04/2009   Qualifier: Diagnosis of  By: Rhunette Croft    . Former smoker    quit 2014  . GERD (gastroesophageal reflux disease)   . HIV infection ([place])   . Hypertension   . Stroke ([place])   . 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 SVD (spontaneous vaginal delivery)    x 3   Review of Systems:   Review of Systems  Constitutional: Negative for chills and fever.  Gastrointestinal: Positive for constipation and nausea. Negative for vomiting.  Musculoskeletal: Positive for joint pain. Negative for falls.  Neurological: Negative for dizziness, loss of consciousness and weakness.  All other systems reviewed and are negative.    Physical Exam: Vitals:   05/12/21 1444  BP: 125/84  Pulse: (!) 105  Temp: 98.4 F (36.9 C)  TempSrc: Oral  SpO2: 100%  Weight: 103 lb 8 oz (46.9 kg)  Height: 5' 3.5" (1.613 m)   Constitutional: no acute distress Head: atraumatic ENT: external ears normal Cardiovascular: regular rate and rhythm, normal heart sounds Pulmonary: effort normal, normal breath sounds bilaterally Abdominal: flat Musculoskeletal: Crepitus of bilateral knees Skin: warm and dry Neurological: alert, no focal deficit Psychiatric: normal mood and affect  Assessment & Plan:   See  Encounters Tab for problem based charting.  Patient discussed with Dr. Philipp Ovens

## 2021-05-12 NOTE — Assessment & Plan Note (Signed)
Chronic left knee pain d/t osteoarthritis. Also had mild degenerative disc disease in the cervical spine. States that these limit her ambulation, and that tramadol helps her be more mobile and helps with ADLs such as cooking, bathing, grocery shopping. Last refill was on 04/05/21. Last Toxassure in January 2021 and was within normal limits.  - repeat Toxassure - refill Tramadol 50mg  BID prn - treating constipation as documented elsewhere

## 2021-05-12 NOTE — Patient Instructions (Signed)
Thank you for allowing [place] to be a part of your care today, it was a pleasure seeing you. We discussed your knee pain and constipation  I am checking these labs: TSH  I have made these changes to your medications: START Senokot-S. Take 1 daily, and adjust to achieve 1 bowel movement per day  Please follow up in 3 months   Thank you, and please call the Internal Medicine Clinic at [phone] if you have any questions.  Best, Dr. Bridgett Larsson

## 2021-05-12 NOTE — Telephone Encounter (Signed)
Requesting another doctor to write Rx for  traMADol (ULTRAM) 50 MG tablet, insurance will not accepted Dr. Bridgett Larsson name. Pt would like the nurse to contact the pharmacy.

## 2021-05-12 NOTE — Assessment & Plan Note (Addendum)
Patient presents with acute on chronic constipation. States this has been an issue her whole life. She has been using miralax twice daily which was effective, but has recently become worse. Of note, she has started taking Tramadol chronically for knee pain as of roughly 2 years ago. She describes a diet which has adequate fiber intake. Has tried over the counter docusate, prune juice, milk of magnesia without improvement.  - start Senkot-S. Goal of 1 bowel movement daily - discussed trying to wean tramadol use as possible, but she does have significant functional improvements with it - check TSH, free T4  Addendum: normal TSH and free T4

## 2021-05-13 DIAGNOSIS — M1990 Unspecified osteoarthritis, unspecified site: Secondary | ICD-10-CM | POA: Diagnosis not present

## 2021-05-13 LAB — TSH: TSH: 1.3 u[IU]/mL (ref 0.450–4.500)

## 2021-05-13 LAB — T4, FREE: Free T4: 1.27 ng/dL (ref 0.82–1.77)

## 2021-05-17 DIAGNOSIS — M1990 Unspecified osteoarthritis, unspecified site: Secondary | ICD-10-CM | POA: Diagnosis not present

## 2021-05-18 DIAGNOSIS — Z419 Encounter for procedure for purposes other than remedying health state, unspecified: Secondary | ICD-10-CM | POA: Diagnosis not present

## 2021-05-18 DIAGNOSIS — M1990 Unspecified osteoarthritis, unspecified site: Secondary | ICD-10-CM | POA: Diagnosis not present

## 2021-05-19 DIAGNOSIS — M1990 Unspecified osteoarthritis, unspecified site: Secondary | ICD-10-CM | POA: Diagnosis not present

## 2021-05-20 DIAGNOSIS — J301 Allergic rhinitis due to pollen: Secondary | ICD-10-CM | POA: Diagnosis not present

## 2021-05-20 DIAGNOSIS — J3089 Other allergic rhinitis: Secondary | ICD-10-CM | POA: Diagnosis not present

## 2021-05-20 DIAGNOSIS — Z7689 Persons encountering health services in other specified circumstances: Secondary | ICD-10-CM | POA: Diagnosis not present

## 2021-05-20 NOTE — Progress Notes (Signed)
Internal Medicine Clinic Attending  Case discussed with Dr. Chen  At the time of the visit.  We reviewed the resident's history and exam and pertinent patient test results.  I agree with the assessment, diagnosis, and plan of care documented in the resident's note. 

## 2021-05-23 LAB — TOXASSURE SELECT,+ANTIDEPR,UR

## 2021-05-27 ENCOUNTER — Encounter: Payer: Self-pay | Admitting: Internal Medicine

## 2021-05-27 ENCOUNTER — Other Ambulatory Visit: Payer: Self-pay

## 2021-05-27 ENCOUNTER — Other Ambulatory Visit: Payer: Self-pay | Admitting: *Deleted

## 2021-05-27 ENCOUNTER — Other Ambulatory Visit (HOSPITAL_COMMUNITY): Payer: Medicaid Other

## 2021-05-27 DIAGNOSIS — J3089 Other allergic rhinitis: Secondary | ICD-10-CM | POA: Diagnosis not present

## 2021-05-27 DIAGNOSIS — J449 Chronic obstructive pulmonary disease, unspecified: Secondary | ICD-10-CM

## 2021-05-27 DIAGNOSIS — Z7689 Persons encountering health services in other specified circumstances: Secondary | ICD-10-CM | POA: Diagnosis not present

## 2021-05-27 DIAGNOSIS — J301 Allergic rhinitis due to pollen: Secondary | ICD-10-CM | POA: Diagnosis not present

## 2021-05-27 NOTE — Patient Instructions (Signed)
Visit Information  Ms. Ganger was given information about Medicaid Managed Care team care coordination services as a part of their [facility] Medicaid benefit. [name] verbally consented to engagement with the [facility] Managed Care team.   For questions related to your [facility] health plan, please call: [phone] or go here:[email or web address]  If you would like to schedule transportation through your [facility] plan, please call the following number at least 2 days in advance of your appointment: [phone].  Call the [place] at [phone], at any time, 24 hours a day, 7 days a week. If you are in danger or need immediate medical attention call 911.  Ms. Conry - following are the goals we discussed in your visit today:   Goals Addressed             This Visit's Progress    Learn and Do Breathing Exercises-COPD       Timeframe:  Long-Range Goal Priority:  Medium Start Date:    01/03/21                         Expected End Date: 07/27/21       Follow up 07/27/21           - attend all scheduled appointments - take all medication as prescribed - contact your provider with any questions, concerns or changes in symptoms - do breathing exercises every day - do exercises in a comfortable position that makes breathing as easy as possible    Why is this important?   Breathing exercises can help lessen the cough that comes with chronic obstructive pulmonary disease.  Doing the exercises will give you more energy.  They will also help you to control your symptoms.              Please see education materials related to constipation and COPD provided by MyChart link.  Patient verbalizes understanding of instructions provided today.   Telephone follow up appointment with Managed Medicaid care management team member scheduled for:07/27/21 @ [place] RN, BSN [place] RN Care  Coordinator   Following is a copy of your plan of care:  Patient Care Plan: COPD (Adult)     Problem Identified: Disease Progression (COPD)      Long-Range Goal: Disease Progression Minimized or Managed   Start Date: 01/03/2021  Expected End Date: 07/27/2021  This Visit's Progress: On track  Recent Progress: On track  Priority: Medium  Note:   Current Barriers:  Chronic Disease Management support and education needs related to COPD-Patient feels that she is managing her health at home very well. She reports needing a raised toilet for ease and mobility. 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 Ms Latulippe has a [place] aide 7 days a week for a couple of hours a day to assistance with bathing, light house duties and shopping.-Update-Ms. Alessio received the requested bedside commode. She reports no needs at this time. She continues to attend all of her scheduled appointments. She is dealing with her father "transitioning" and works with a Social worker regarding her emotions at this time. Nurse Case Manager Clinical Goal(s):  patient will verbalize understanding of plan for managing COPD patient will attend all scheduled medical appointments the patient will demonstrate ongoing self health care management ability as evidenced by taking medications as prescribed, notifying provider with any concerns or changes in  symptoms, work on breathing exercises daily Interventions:  Inter-disciplinary care team collaboration (see longitudinal plan of care) Evaluation of current treatment plan related to COPD and patient's adherence to plan as established by provider. Discussed plans with patient for ongoing care management follow up and provided patient with direct contact information for care management team Provided patient with mychart educational materials related to constipation and COPD Patient Goals/Self-Care Activities - attend all scheduled appointments - take  all medication as prescribed - contact your provider with any questions, concerns or changes in symptoms - do breathing exercises every day - do exercises in a comfortable position that makes breathing as easy as possible  Follow Up Plan: Telephone follow up appointment with Managed Medicaid care management team member scheduled for:07/27/21 @ 9am

## 2021-05-27 NOTE — Patient Outreach (Signed)
Medicaid Managed Care   Nurse Care Manager Note  05/27/2021 Name:  Janice Williams MRN:  [id number] DOB:  May 29, 1961  Janice Williams is an 60 y.o. year old female who is a primary patient of Madalyn Rob, MD.  The [facility] Managed Care Coordination team was consulted for assistance with:    COPD  Janice Williams was given information about Medicaid Managed Care Coordination team services today. Janice Williams agreed to services and verbal consent obtained.  Engaged with patient by telephone for follow up visit in response to provider referral for case management and/or care coordination services.   Assessments/Interventions:  Review of past medical history, allergies, medications, health status, including review of consultants reports, laboratory and other test data, was performed as part of comprehensive evaluation and provision of chronic care management services.  SDOH (Social Determinants of Health) assessments and interventions performed:   Care Plan  Allergies  Allergen Reactions   Chantix [Varenicline] Itching    Medications Reviewed Today     Reviewed by Melissa Montane, RN (Registered Nurse) on 05/27/21 at 1326  Med List Status: <None>   Medication Order Taking? Sig Documenting Provider Last Dose Status Informant  albuterol (VENTOLIN HFA) 108 (90 Base) MCG/ACT inhaler [id number] Yes Inhale 2 puffs into the lungs every 6 (six) hours as needed for wheezing or shortness of breath. [provider] Taking Active Self  amLODipine (NORVASC) 10 MG tablet [id number] Yes TAKE 1 TABLET(10 MG) BY MOUTH DAILY Christian, Rylee, MD Taking Active   bictegravir-emtricitabine-tenofovir AF (BIKTARVY) 50-200-25 MG TABS tablet [id number] Yes TAKE 1 TABLET BY MOUTH DAILY. Carlyle Basques, MD Taking Active   BIKTARVY 50-200-25 MG TABS tablet [id number] Yes TAKE 1 TABLET BY MOUTH DAILY. Carlyle Basques, MD Taking Active   budesonide-formoterol [facility]) 80-4.5 MCG/ACT inhaler [id number] Yes TAKE 2  PUFFS FIRST THING IN THE MORNING AND THEN ANOTHER 2 PUFFS 12 HOURS LATER Tanda Rockers, MD Taking Active   Ensure [facility]) [id number] Yes Take 237 mLs by mouth 3 (three) times daily between meals. Carlyle Basques, MD Taking Active   fluticasone [facility]) 50 MCG/ACT nasal spray [id number] Yes Place 1 spray into both nostrils daily. Madalyn Rob, MD Taking Active   levocetirizine (XYZAL) 5 MG tablet [id number] Yes Take 5 mg by mouth every evening. [provider] Taking Active Self  megestrol (MEGACE) 40 MG tablet [id number] Yes Take 1 tablet (40 mg total) by mouth daily. Rehman, Areeg N, DO Taking Active   montelukast (SINGULAIR) 10 MG tablet [id number] Yes TAKE 1 TABLET(10 MG) BY MOUTH AT BEDTIME Sid Falcon, MD Taking Active   omeprazole (PRILOSEC) 20 MG capsule [id number] Yes Take 1 capsule (20 mg total) by mouth daily. Dewayne Hatch, MD Taking Active   polyethylene glycol [facility] / GLYCOLAX) packet [id number] No Take one capful in 8 oz of fluid of your choice twice a day until having soft stools, then back down to once a day.  If not having soft stools with twice a day, increased to three times a day  Patient not taking: Reported on 05/27/2021   Linton Flemings, MD Not Taking Active            Med Note Vita Barley Nov 15, 2020 11:04 AM)    pravastatin (PRAVACHOL) 20 MG tablet [id number] Yes Take 1 tablet (20 mg total) by mouth daily. Madalyn Rob, MD Taking Active   PRESCRIPTION MEDICATION [id number] Yes Inject 1 Dose into the skin once a week. Allergy shot in  office on Tuesdays [provider] Taking Active Self  senna-docusate (SENOKOT-S) 8.6-50 MG tablet [id number] Yes Take 1 tablet by mouth daily. May adjust to achieve 1 bowel movement daily Andrew Au, MD Taking Active   traMADol [facility]) 50 MG tablet [id number] Yes Take 1 tablet (50 mg total) by mouth every 12 (twelve) hours as needed. Velna Ochs, MD Taking Active             Patient Active  Problem List   Diagnosis Date Noted   Constipation 05/12/2021   Cough 03/30/2021   Screening for cervical cancer 03/18/2021   Ear pain, bilateral 03/17/2021   Degenerative disc disease, cervical 10/13/2020   Chronic, continuous use of opioids 09/10/2020   Rhinitis, chronic 06/10/2020   Restless legs syndrome (RLS) 01/08/2020   Osteoporosis 10/10/2019   COPD gold ?  07/08/2019   Moderate protein-calorie malnutrition ([place]) 05/31/2018   Hypertension 06/08/2017   Left knee pain 03/15/2015   Healthcare maintenance 07/06/2011   Hyperlipidemia 06/03/2009   WEIGHT LOSS 06/03/2009   Human immunodeficiency virus (HIV) disease ([place]) 10/29/2008   GERD 10/29/2008   CEREBROVASCULAR ACCIDENT, HX OF 10/29/2008   TOBACCO DEPENDENCE 02/14/2007   Allergic rhinitis 02/14/2007   Symptoms concerning nutrition, metabolism, and development 02/14/2007    Conditions to be addressed/monitored per PCP order:  COPD  Care Plan : COPD (Adult)  Updates made by Melissa Montane, RN since 05/27/2021 12:00 AM     Problem: Disease Progression (COPD)      Long-Range Goal: Disease Progression Minimized or Managed   Start Date: 01/03/2021  Expected End Date: 07/27/2021  This Visit's Progress: On track  Recent Progress: On track  Priority: Medium  Note:   Current Barriers:  Chronic Disease Management support and education needs related to COPD-Patient feels that she is managing her health at home very well. She reports needing a raised toilet for ease and mobility. 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 Janice Williams has a [place] aide 7 days a week for a couple of hours a day to assistance with bathing, light house duties and shopping.-Update-Janice. Williams received the requested bedside commode. She reports no needs at this time. She continues to attend all of her scheduled appointments. She is dealing with her father "transitioning" and works with a Social worker regarding her  emotions at this time. Nurse Case Manager Clinical Goal(s):  patient will verbalize understanding of plan for managing COPD patient will attend all scheduled medical appointments the patient will demonstrate ongoing self health care management ability as evidenced by taking medications as prescribed, notifying provider with any concerns or changes in symptoms, work on breathing exercises daily Interventions:  Inter-disciplinary care team collaboration (see longitudinal plan of care) Evaluation of current treatment plan related to COPD and patient's adherence to plan as established by provider. Discussed plans with patient for ongoing care management follow up and provided patient with direct contact information for care management team Provided patient with mychart educational materials related to constipation and COPD Patient Goals/Self-Care Activities - attend all scheduled appointments - take all medication as prescribed - contact your provider with any questions, concerns or changes in symptoms - do breathing exercises every day - do exercises in a comfortable position that makes breathing as easy as possible  Follow Up Plan: Telephone follow up appointment with Managed Medicaid care management team member scheduled for:07/27/21 @ 9am         Follow Up:  Patient  agrees to Care Plan and Follow-up.  Plan: The Managed Medicaid care management team will reach out to the patient again over the next 60 days.  Date/time of next scheduled RN care management/care coordination outreach:  07/27/21 @ [place] RN, [place] RN Care Coordinator

## 2021-05-30 ENCOUNTER — Ambulatory Visit: Payer: Medicaid Other | Admitting: Acute Care

## 2021-05-30 ENCOUNTER — Ambulatory Visit: Payer: Medicaid Other | Admitting: Internal Medicine

## 2021-05-31 ENCOUNTER — Other Ambulatory Visit (HOSPITAL_COMMUNITY): Payer: Self-pay

## 2021-05-31 DIAGNOSIS — J3089 Other allergic rhinitis: Secondary | ICD-10-CM | POA: Diagnosis not present

## 2021-05-31 DIAGNOSIS — Z7689 Persons encountering health services in other specified circumstances: Secondary | ICD-10-CM | POA: Diagnosis not present

## 2021-05-31 DIAGNOSIS — M1990 Unspecified osteoarthritis, unspecified site: Secondary | ICD-10-CM | POA: Diagnosis not present

## 2021-05-31 DIAGNOSIS — J301 Allergic rhinitis due to pollen: Secondary | ICD-10-CM | POA: Diagnosis not present

## 2021-06-01 DIAGNOSIS — M1990 Unspecified osteoarthritis, unspecified site: Secondary | ICD-10-CM | POA: Diagnosis not present

## 2021-06-02 DIAGNOSIS — M1990 Unspecified osteoarthritis, unspecified site: Secondary | ICD-10-CM | POA: Diagnosis not present

## 2021-06-03 DIAGNOSIS — M1990 Unspecified osteoarthritis, unspecified site: Secondary | ICD-10-CM | POA: Diagnosis not present

## 2021-06-07 ENCOUNTER — Other Ambulatory Visit (HOSPITAL_COMMUNITY): Payer: Self-pay

## 2021-06-07 DIAGNOSIS — M1990 Unspecified osteoarthritis, unspecified site: Secondary | ICD-10-CM | POA: Diagnosis not present

## 2021-06-07 DIAGNOSIS — J3089 Other allergic rhinitis: Secondary | ICD-10-CM | POA: Diagnosis not present

## 2021-06-07 DIAGNOSIS — J301 Allergic rhinitis due to pollen: Secondary | ICD-10-CM | POA: Diagnosis not present

## 2021-06-07 DIAGNOSIS — Z7689 Persons encountering health services in other specified circumstances: Secondary | ICD-10-CM | POA: Diagnosis not present

## 2021-06-08 DIAGNOSIS — M1990 Unspecified osteoarthritis, unspecified site: Secondary | ICD-10-CM | POA: Diagnosis not present

## 2021-06-09 ENCOUNTER — Telehealth: Payer: Self-pay

## 2021-06-09 ENCOUNTER — Other Ambulatory Visit: Payer: Self-pay | Admitting: Internal Medicine

## 2021-06-09 NOTE — Telephone Encounter (Signed)
Requesting her traMADol (ULTRAM) 50 MG tablet sent to  [place] ([place]), [place] Phone:  [phone]  Fax:  [phone]

## 2021-06-10 DIAGNOSIS — M1990 Unspecified osteoarthritis, unspecified site: Secondary | ICD-10-CM | POA: Diagnosis not present

## 2021-06-13 DIAGNOSIS — M1990 Unspecified osteoarthritis, unspecified site: Secondary | ICD-10-CM | POA: Diagnosis not present

## 2021-06-15 ENCOUNTER — Other Ambulatory Visit: Payer: Self-pay | Admitting: Family

## 2021-06-15 DIAGNOSIS — M1990 Unspecified osteoarthritis, unspecified site: Secondary | ICD-10-CM | POA: Diagnosis not present

## 2021-06-15 DIAGNOSIS — B20 Human immunodeficiency virus [HIV] disease: Secondary | ICD-10-CM

## 2021-06-16 DIAGNOSIS — M1990 Unspecified osteoarthritis, unspecified site: Secondary | ICD-10-CM | POA: Diagnosis not present

## 2021-06-17 DIAGNOSIS — Z419 Encounter for procedure for purposes other than remedying health state, unspecified: Secondary | ICD-10-CM | POA: Diagnosis not present

## 2021-06-17 DIAGNOSIS — J301 Allergic rhinitis due to pollen: Secondary | ICD-10-CM | POA: Diagnosis not present

## 2021-06-17 DIAGNOSIS — Z7689 Persons encountering health services in other specified circumstances: Secondary | ICD-10-CM | POA: Diagnosis not present

## 2021-06-17 DIAGNOSIS — M1990 Unspecified osteoarthritis, unspecified site: Secondary | ICD-10-CM | POA: Diagnosis not present

## 2021-06-17 DIAGNOSIS — J3089 Other allergic rhinitis: Secondary | ICD-10-CM | POA: Diagnosis not present

## 2021-06-21 ENCOUNTER — Encounter: Payer: Self-pay | Admitting: *Deleted

## 2021-06-22 DIAGNOSIS — M1990 Unspecified osteoarthritis, unspecified site: Secondary | ICD-10-CM | POA: Diagnosis not present

## 2021-06-24 DIAGNOSIS — J3089 Other allergic rhinitis: Secondary | ICD-10-CM | POA: Diagnosis not present

## 2021-06-24 DIAGNOSIS — Z7689 Persons encountering health services in other specified circumstances: Secondary | ICD-10-CM | POA: Diagnosis not present

## 2021-06-24 DIAGNOSIS — J301 Allergic rhinitis due to pollen: Secondary | ICD-10-CM | POA: Diagnosis not present

## 2021-07-01 ENCOUNTER — Other Ambulatory Visit (HOSPITAL_COMMUNITY): Payer: Self-pay

## 2021-07-01 DIAGNOSIS — Z7689 Persons encountering health services in other specified circumstances: Secondary | ICD-10-CM | POA: Diagnosis not present

## 2021-07-05 DIAGNOSIS — Z7689 Persons encountering health services in other specified circumstances: Secondary | ICD-10-CM | POA: Diagnosis not present

## 2021-07-05 DIAGNOSIS — J301 Allergic rhinitis due to pollen: Secondary | ICD-10-CM | POA: Diagnosis not present

## 2021-07-05 DIAGNOSIS — J3089 Other allergic rhinitis: Secondary | ICD-10-CM | POA: Diagnosis not present

## 2021-07-06 ENCOUNTER — Other Ambulatory Visit (HOSPITAL_COMMUNITY): Payer: Self-pay

## 2021-07-06 DIAGNOSIS — M1990 Unspecified osteoarthritis, unspecified site: Secondary | ICD-10-CM | POA: Diagnosis not present

## 2021-07-07 DIAGNOSIS — M1990 Unspecified osteoarthritis, unspecified site: Secondary | ICD-10-CM | POA: Diagnosis not present

## 2021-07-08 ENCOUNTER — Other Ambulatory Visit: Payer: Self-pay | Admitting: Internal Medicine

## 2021-07-08 ENCOUNTER — Other Ambulatory Visit: Payer: Self-pay

## 2021-07-08 NOTE — Telephone Encounter (Signed)
traMADol (ULTRAM) 50 MG tablet, REFILL REQUEST @ [place] ([place]), [place].

## 2021-07-08 NOTE — Telephone Encounter (Signed)
Next appt scheduled 8/25 with Dr Court Joy.

## 2021-07-10 MED ORDER — TRAMADOL HCL 50 MG PO TABS: 50.0000 mg | ORAL_TABLET | Freq: Two times a day (BID) | ORAL | 2 refills | Status: DC | PRN

## 2021-07-11 ENCOUNTER — Other Ambulatory Visit (HOSPITAL_COMMUNITY): Payer: Self-pay

## 2021-07-11 DIAGNOSIS — J449 Chronic obstructive pulmonary disease, unspecified: Secondary | ICD-10-CM | POA: Diagnosis not present

## 2021-07-11 DIAGNOSIS — M1990 Unspecified osteoarthritis, unspecified site: Secondary | ICD-10-CM | POA: Diagnosis not present

## 2021-07-11 MED ORDER — TRAMADOL HCL 50 MG PO TABS: 50.0000 mg | ORAL_TABLET | Freq: Two times a day (BID) | ORAL | 2 refills | Status: DC | PRN

## 2021-07-11 NOTE — Telephone Encounter (Signed)
I called [facility] who stated medicaid is not accepting Dr Lonzo Candy DEA #. I will send request to The Attending.

## 2021-07-11 NOTE — Telephone Encounter (Signed)
Refill Request-Patient states she was unable to pick her medication up as the Dr.'s DEA number is not working       traMADol (ULTRAM) 50 MG tablet    Preferred pharmacy: [place] ([place]), [place] [address] Delivery method: [employer]

## 2021-07-11 NOTE — Telephone Encounter (Signed)
Pt called / informed of tramadol refill.

## 2021-07-12 DIAGNOSIS — M1990 Unspecified osteoarthritis, unspecified site: Secondary | ICD-10-CM | POA: Diagnosis not present

## 2021-07-13 ENCOUNTER — Other Ambulatory Visit: Payer: Self-pay

## 2021-07-13 ENCOUNTER — Other Ambulatory Visit: Payer: Self-pay | Admitting: Student

## 2021-07-13 ENCOUNTER — Telehealth: Payer: Self-pay | Admitting: Internal Medicine

## 2021-07-13 DIAGNOSIS — J301 Allergic rhinitis due to pollen: Secondary | ICD-10-CM

## 2021-07-13 DIAGNOSIS — M1990 Unspecified osteoarthritis, unspecified site: Secondary | ICD-10-CM | POA: Diagnosis not present

## 2021-07-13 MED ORDER — BUDESONIDE-FORMOTEROL FUMARATE 80-4.5 MCG/ACT IN AERO: 2.0000 | INHALATION_SPRAY | Freq: Two times a day (BID) | RESPIRATORY_TRACT | 9 refills | Status: DC

## 2021-07-13 NOTE — Telephone Encounter (Signed)
Secure chat message sent to [name] due to still not hearing from him if he had any other recommendations for pt.  Per [name], no additional recommendations other than for pt to restart her Symbicort ASAP and for her to use her rescue therapy in the meantime up to every 4 hours as needed.   Attempted to call pt but unable to reach. Per DPR it is okay to leave a detailed message on machine so left pt a detailed message about info stated by [name]. Nothing further needed.

## 2021-07-13 NOTE — Telephone Encounter (Signed)
Pt is requesting her fluticasone (FLONASE) 50 MCG/ACT nasal spray sent to  [place] ([place]), [place] Phone:  [phone]  Fax:  [phone]

## 2021-07-13 NOTE — Telephone Encounter (Signed)
Spoke to patient, who is requesting refill on Symbicort. She has been without symbicort for 2 days and she is now experiencing increased sob.  Using albuterol HFA with BID with no relief in sx.  Denied fever, chills, sweats or additional sx.  Symbicort has been sent to preferred pharmacy.   Routing to MW for additional recommendations.

## 2021-07-15 DIAGNOSIS — J301 Allergic rhinitis due to pollen: Secondary | ICD-10-CM | POA: Diagnosis not present

## 2021-07-15 DIAGNOSIS — Z7689 Persons encountering health services in other specified circumstances: Secondary | ICD-10-CM | POA: Diagnosis not present

## 2021-07-15 DIAGNOSIS — J3089 Other allergic rhinitis: Secondary | ICD-10-CM | POA: Diagnosis not present

## 2021-07-15 DIAGNOSIS — M1990 Unspecified osteoarthritis, unspecified site: Secondary | ICD-10-CM | POA: Diagnosis not present

## 2021-07-17 ENCOUNTER — Encounter: Payer: Self-pay | Admitting: Emergency Medicine

## 2021-07-17 ENCOUNTER — Ambulatory Visit
Admission: EM | Admit: 2021-07-17 | Discharge: 2021-07-17 | Disposition: A | Discharge status: Home / Self Care | Payer: Medicaid Other | Attending: Physician Assistant | Admitting: Physician Assistant

## 2021-07-17 ENCOUNTER — Other Ambulatory Visit: Payer: Self-pay

## 2021-07-17 DIAGNOSIS — M25571 Pain in right ankle and joints of right foot: Secondary | ICD-10-CM | POA: Diagnosis not present

## 2021-07-17 DIAGNOSIS — R059 Cough, unspecified: Secondary | ICD-10-CM

## 2021-07-17 DIAGNOSIS — Z20822 Contact with and (suspected) exposure to covid-19: Secondary | ICD-10-CM | POA: Diagnosis not present

## 2021-07-17 DIAGNOSIS — J441 Chronic obstructive pulmonary disease with (acute) exacerbation: Secondary | ICD-10-CM

## 2021-07-17 MED ORDER — FLUTICASONE PROPIONATE 50 MCG/ACT NA SUSP: 1.0000 | Freq: Every day | NASAL | 3 refills | Status: DC

## 2021-07-17 MED ORDER — DOXYCYCLINE HYCLATE 100 MG PO CAPS: 100.0000 mg | ORAL_CAPSULE | Freq: Two times a day (BID) | ORAL | 0 refills | Status: DC

## 2021-07-17 MED ORDER — METHYLPREDNISOLONE 4 MG PO TBPK: ORAL_TABLET | ORAL | 0 refills | Status: DC

## 2021-07-17 MED ORDER — ALBUTEROL SULFATE HFA 108 (90 BASE) MCG/ACT IN AERS
2.0000 | INHALATION_SPRAY | Freq: Once | RESPIRATORY_TRACT | Status: AC
  Administered 2021-07-17: 2 via RESPIRATORY_TRACT

## 2021-07-17 NOTE — Discharge Instructions (Addendum)
Given her history of COPD we are going to start you on steroids and antibiotics.  Please stay out of the sun while on doxycycline as it can make you sensitive to the sun.  Use albuterol as needed for shortness of breath.  We will contact you if your COVID is positive.  Continue your inhaler regimen as previously prescribed.  If anything worsens please return for reevaluation.  Please return if you are interested in getting x-ray as we discussed previously.  Use Tylenol for pain.  Keep this elevated and apply ice.  Use brace to help with stability and pain.  If you have any worsening symptoms you need to be reevaluated.

## 2021-07-17 NOTE — ED Triage Notes (Addendum)
Hx of bronchitis. C/o nasal and chest congestion ongoing x 4 days. Reports green tinged sputum. Denies fever.  Also c/o right ankle pain after slipping off front porch last week. Mild limp, no visible swelling, requesting x-ray.

## 2021-07-17 NOTE — ED Provider Notes (Signed)
[facility]    CSN: [id number] Arrival date & time: 07/17/21  1442      History   Chief Complaint Chief Complaint  Patient presents with   Cough   Nasal Congestion   Ankle Pain    HPI Janice Williams is a 60 y.o. female.   Patient presents today with a 4-day history of productive cough.  Reports associated nasal congestion and shortness of breath.  She does have a history of COPD and states this is similar to previous flares.  She denies any fever, nausea, vomiting, chest pain, dizziness, syncope.  She does report increased sputum which she describes as thick and colored.  She reports possible sick contacts with similar symptoms.  She denies any recent antibiotic use.  She has been adhering to medication regimen as prescribed by pulmonologist.  She is up-to-date on COVID-19 vaccine.  She is a former smoker who quit approximately 8 years ago.  In addition, patient reports a several week history of right ankle pain.  Reports that she rolled her ankle as she was going down the stairs several weeks ago but has not had time to get this checked out.  She reports pain is localized to lateral right ankle into foot, described as aching, worse with palpation or ambulation, no alleviating factors identified.  She denies any weakness, numbness, paresthesias.  She is status post CVA and has weakness on this side chronically but states this is unchanged from baseline.  She has been using over-the-counter medications without improvement of symptoms.   Past Medical History:  Diagnosis Date   Allergy    Anemia    Arthritis    lower back, knees   Bronchitis    COPD (chronic obstructive pulmonary disease) (HCC)    COPD with acute exacerbation ([place]) 06/09/2017   Dental abscess 03/15/2015   DYSPEPSIA 02/14/2007   Qualifier: Diagnosis of  By: Samara Snide     EXTERNAL OTITIS 01/06/2010   Qualifier: Diagnosis of  By: Tomma Lightning MD, Claiborne Billings     FACIAL RASH 02/04/2009   Qualifier: Diagnosis of  By:  Rhunette Croft     Former smoker    quit 2014   GERD (gastroesophageal reflux disease)    HIV infection ([place])    Hypertension    Stroke ([place])    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  07/08/2019   Moderate protein-calorie malnutrition ([place]) 05/31/2018   Hypertension 06/08/2017   Left knee pain 03/15/2015   Healthcare maintenance 07/06/2011   Hyperlipidemia 06/03/2009   WEIGHT LOSS 06/03/2009   Human immunodeficiency virus (HIV) disease ([place]) 10/29/2008   GERD 10/29/2008   CEREBROVASCULAR ACCIDENT, HX OF 10/29/2008   TOBACCO DEPENDENCE 02/14/2007   Allergic rhinitis 02/14/2007   Symptoms concerning nutrition, metabolism, and development 02/14/2007    Past Surgical History:  Procedure Laterality Date   MULTIPLE TOOTH EXTRACTIONS     with sedation   TUBAL LIGATION     UPPER GI ENDOSCOPY     normal per patient - "years  ago"    OB History     Gravida  3   Para  3   Term  3   Preterm      AB      Living  3      SAB      IAB      Ectopic      Multiple      Live Births  3            Home Medications    Prior to Admission medications   Medication Sig Start Date End Date Taking? Authorizing Provider  doxycycline (VIBRAMYCIN) 100 MG capsule Take 1 capsule (100 mg total) by mouth 2 (two) times daily. 07/17/21  Yes Anil Havard K, PA-C  methylPREDNISolone (MEDROL DOSEPAK) 4 MG TBPK tablet As directed 07/17/21  Yes Sun Kihn K, PA-C  albuterol (VENTOLIN HFA) 108 (90 Base) MCG/ACT inhaler Inhale 2 puffs into the  lungs every 6 (six) hours as needed for wheezing or shortness of breath.    [provider]  amLODipine (NORVASC) 10 MG tablet TAKE 1 TABLET(10 MG) BY MOUTH DAILY 02/28/21   Christian, Rylee, MD  bictegravir-emtricitabine-tenofovir AF (BIKTARVY) 50-200-25 MG TABS tablet TAKE 1 TABLET BY MOUTH DAILY. 05/06/21 05/06/22  Carlyle Basques, MD  BIKTARVY 50-200-25 MG TABS tablet TAKE 1 TABLET BY MOUTH DAILY. 12/08/20   Carlyle Basques, MD  budesonide-formoterol (SYMBICORT) 80-4.5 MCG/ACT inhaler TAKE 2 PUFFS FIRST THING IN THE MORNING AND THEN ANOTHER 2 PUFFS 12 HOURS LATER 03/14/21   Tanda Rockers, MD  budesonide-formoterol [facility]) 80-4.5 MCG/ACT inhaler Inhale 2 puffs into the lungs 2 (two) times daily. 07/13/21   Tanda Rockers, MD  Ensure (ENSURE) Take 237 mLs by mouth 3 (three) times daily between meals. 04/28/21   Carlyle Basques, MD  fluticasone (FLONASE) 50 MCG/ACT nasal spray Place 1 spray into both nostrils daily. 05/04/21   Madalyn Rob, MD  levocetirizine (XYZAL) 5 MG tablet Take 5 mg by mouth every evening.    [provider]  megestrol (MEGACE) 40 MG tablet Take 1 tablet (40 mg total) by mouth daily. 04/13/21   Rehman, Areeg N, DO  montelukast (SINGULAIR) 10 MG tablet TAKE 1 TABLET(10 MG) BY MOUTH AT BEDTIME 12/13/20   Sid Falcon, MD  omeprazole (PRILOSEC) 20 MG capsule Take 1 capsule (20 mg total) by mouth daily. 11/16/20   Masoudi, Dorthula Rue, MD  polyethylene glycol (MIRALAX / GLYCOLAX) packet Take one capful in 8 oz of fluid of your choice twice a day until having soft stools, then back down to once a day.  If not having soft stools with twice a day, increased to three times a day Patient not taking: Reported on 05/27/2021 07/21/15   Linton Flemings, MD  pravastatin (PRAVACHOL) 20 MG tablet Take 1 tablet (20 mg total) by mouth daily. 01/27/21   Madalyn Rob, MD  PRESCRIPTION MEDICATION Inject 1 Dose into the skin once a week. Allergy shot in office on Tuesdays    [provider]  senna-docusate (SENOKOT-S) 8.6-50 MG tablet Take 1 tablet by mouth daily. May adjust to achieve 1 bowel movement daily 05/12/21   Andrew Au, MD  traMADol (ULTRAM) 50 MG tablet Take 1 tablet (50 mg total) by mouth every 12 (twelve) hours as needed. 07/11/21   Aldine Contes, MD    Family History Family History  Problem Relation Age of Onset   Hypertension Mother    Hypertension Father    Hyperlipidemia Father  COPD Father    Atrial fibrillation Father    Colon cancer Neg Hx    Rectal cancer Neg Hx    Stomach cancer Neg Hx     Social History Social History   Tobacco Use   Smoking status: Former    Packs/day: 0.40    Years: 38.00    Pack years: 15.20    Types: Cigarettes    Start date: 12/18/2012    Quit date: 11/21/2013    Years since quitting: 7.6   Smokeless tobacco: Never  Vaping Use   Vaping Use: Never used  Substance Use Topics   Alcohol use: Not Currently   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     Allergies   Chantix [varenicline]   Review of Systems Review of Systems  Constitutional:  Positive for activity change and fatigue. Negative for appetite change and fever.  HENT:  Positive for congestion. Negative for sinus pressure, sneezing and sore throat.   Respiratory:  Positive for cough and shortness of breath.   Cardiovascular:  Negative for chest pain.  Gastrointestinal:  Negative for abdominal pain, diarrhea, nausea and vomiting.  Musculoskeletal:  Positive for arthralgias, gait problem and joint swelling. Negative for myalgias.  Neurological:  Negative for dizziness, light-headedness and headaches.    Physical Exam Triage Vital Signs ED Triage Vitals  Enc Vitals Group     BP 07/17/21 1515 (!) 151/91     Pulse Rate 07/17/21 1515 (!) 102     Resp 07/17/21 1515 16     Temp 07/17/21 1515 98.5 F (36.9 C)     Temp Source 07/17/21 1515 Oral     SpO2 07/17/21 1515 93 %     Weight --      Height --      Head  Circumference --      Peak Flow --      Pain Score 07/17/21 1516 0     Pain Loc --      Pain Edu? --      Excl. in [place]? --    No data found.  Updated Vital Signs BP (!) 151/91 (BP Location: Left Arm)   Pulse (!) 102   Temp 98.5 F (36.9 C) (Oral)   Resp 16   SpO2 93%   Visual Acuity Right Eye Distance:   Left Eye Distance:   Bilateral Distance:    Right Eye Near:   Left Eye Near:    Bilateral Near:     Physical Exam Vitals reviewed.  Constitutional:      General: She is awake. She is not in acute distress.    Appearance: Normal appearance. She is normal weight. She is not ill-appearing.     Comments: Very pleasant female appears stated age no acute distress sitting comfortably in exam room  HENT:     Head: Normocephalic and atraumatic.     Right Ear: Tympanic membrane, ear canal and external ear normal. Tympanic membrane is not erythematous or bulging.     Left Ear: Tympanic membrane, ear canal and external ear normal. Tympanic membrane is not erythematous or bulging.     Nose:     Right Sinus: No maxillary sinus tenderness or frontal sinus tenderness.     Left Sinus: No maxillary sinus tenderness or frontal sinus tenderness.     Mouth/Throat:     Pharynx: Uvula midline. Posterior oropharyngeal erythema present. No oropharyngeal exudate.  Cardiovascular:     Rate and Rhythm: Normal  rate and regular rhythm.     Heart sounds: Normal heart sounds, S1 normal and S2 normal. No murmur heard. Pulmonary:     Effort: Pulmonary effort is normal.     Breath sounds: Wheezing and rhonchi present. No rales.     Comments: Scattered rhonchi and wheezes throughout lung fields. Musculoskeletal:     Right ankle: No swelling. Tenderness present. No lateral malleolus or medial malleolus tenderness. Decreased range of motion.     Comments: Right foot: Mild tenderness to palpation of her lateral right ankle and foot.  Lymphadenopathy:     Head:     Right side of head: No submental,  submandibular or tonsillar adenopathy.     Left side of head: No submental, submandibular or tonsillar adenopathy.     Cervical: No cervical adenopathy.  Psychiatric:        Behavior: Behavior is cooperative.     UC Treatments / Results  Labs (all labs ordered are listed, but only abnormal results are displayed) Labs Reviewed  COVID-19, FLU A+B NAA    EKG   Radiology No results found.  Procedures Procedures (including critical care time)  Medications Ordered in UC Medications  albuterol (VENTOLIN HFA) 108 (90 Base) MCG/ACT inhaler 2 puff (2 puffs Inhalation Given 07/17/21 1558)    Initial Impression / Assessment and Plan / UC Course  I have reviewed the triage vital signs and the nursing notes.  Pertinent labs & imaging results that were available during my care of the patient were reviewed by me and considered in my medical decision making (see chart for details).      Concern for COPD exacerbation given increased productive cough and sputum production.  Will test for COVID as patient would be candidate for oral antivirals if positive.  She was started on doxycycline with instruction to avoid prolonged sun exposure due to positivity associate with this medication.  She was started on steroids with instruction not to take NSAIDs with this medication due to risk of GI bleeding.  Recommended she use over-the-counter medications including Mucinex.  Discussed alarm symptoms that warrant emergent evaluation.  Strict return precautions given to patient expressed understanding.  Discussed patient utility of x-ray, however, as we do not have x-ray onsite patient would have to go to the emergency room to have x-ray obtained she declined this today.  She was given a ankle brace and encouraged to use conservative treatment measures including RICE protocol to manage symptoms.  Discussed that if symptoms or not improving she should return and we will consider x-ray in the future.  Discussed  alarm symptoms that warrant emergent evaluation.  Strict return precautions given to which patient expressed understanding.  Final Clinical Impressions(s) / UC Diagnoses   Final diagnoses:  COPD exacerbation (HCC)  Cough  Acute right ankle pain     Discharge Instructions      Given her history of COPD we are going to start you on steroids and antibiotics.  Please stay out of the sun while on doxycycline as it can make you sensitive to the sun.  Use albuterol as needed for shortness of breath.  We will contact you if your COVID is positive.  Continue your inhaler regimen as previously prescribed.  If anything worsens please return for reevaluation.  Please return if you are interested in getting x-ray as we discussed previously.  Use Tylenol for pain.  Keep this elevated and apply ice.  Use brace to help with stability and pain.  If you  have any worsening symptoms you need to be reevaluated.     ED Prescriptions     Medication Sig Dispense Auth. Provider   methylPREDNISolone (MEDROL DOSEPAK) 4 MG TBPK tablet As directed 21 tablet Nole Robey K, PA-C   doxycycline (VIBRAMYCIN) 100 MG capsule Take 1 capsule (100 mg total) by mouth 2 (two) times daily. 20 capsule Sahir Tolson, Derry Skill, PA-C      PDMP not reviewed this encounter.   Terrilee Croak, PA-C 07/17/21 [id number]

## 2021-07-18 DIAGNOSIS — Z419 Encounter for procedure for purposes other than remedying health state, unspecified: Secondary | ICD-10-CM | POA: Diagnosis not present

## 2021-07-18 DIAGNOSIS — Z7689 Persons encountering health services in other specified circumstances: Secondary | ICD-10-CM | POA: Diagnosis not present

## 2021-07-19 DIAGNOSIS — M1990 Unspecified osteoarthritis, unspecified site: Secondary | ICD-10-CM | POA: Diagnosis not present

## 2021-07-19 LAB — COVID-19, FLU A+B NAA
Influenza A, NAA: NOT DETECTED
Influenza B, NAA: NOT DETECTED
SARS-CoV-2, NAA: NOT DETECTED

## 2021-07-20 DIAGNOSIS — M1990 Unspecified osteoarthritis, unspecified site: Secondary | ICD-10-CM | POA: Diagnosis not present

## 2021-07-21 DIAGNOSIS — M1990 Unspecified osteoarthritis, unspecified site: Secondary | ICD-10-CM | POA: Diagnosis not present

## 2021-07-22 ENCOUNTER — Telehealth: Payer: Self-pay | Admitting: Internal Medicine

## 2021-07-22 DIAGNOSIS — M1990 Unspecified osteoarthritis, unspecified site: Secondary | ICD-10-CM | POA: Diagnosis not present

## 2021-07-22 NOTE — Telephone Encounter (Signed)
Call returned to patient, confirmed DOB. She reports she recently completed a antibiotic and wanted to be sure it was okay for her to take mucinex to help with some residual phlegm she is having. States the phelgm is clear and minimal. I made her aware since she has no allergy it is okay to take mucinex. Made patient aware to call back Monday if she does not feel it is helping. Voiced understanding.    Nothing further needed at this time.

## 2021-07-25 DIAGNOSIS — M1990 Unspecified osteoarthritis, unspecified site: Secondary | ICD-10-CM | POA: Diagnosis not present

## 2021-07-27 ENCOUNTER — Other Ambulatory Visit: Payer: Self-pay | Admitting: *Deleted

## 2021-07-27 NOTE — Patient Outreach (Signed)
Care Coordination  07/27/2021  Janice Williams 12-11-1961 [id number]   Medicaid Managed Care   Unsuccessful Outreach Note  07/27/2021 Name: Janice Williams MRN: [id number] DOB: May 12, 1961  Referred by: Madalyn Rob, MD Reason for referral : High Risk Managed Medicaid (Unsuccessful RNCM follow up outreach)   An unsuccessful telephone outreach was attempted today. The patient was referred to the case management team for assistance with care management and care coordination.   Follow Up Plan: A HIPAA compliant phone message was left for the patient providing contact information and requesting a return call.   Lurena Joiner RN, BSN [place]  [employer] manager

## 2021-07-27 NOTE — Patient Instructions (Signed)
Visit Information  [name]  - as a part of your Medicaid benefit, you are eligible for care management and care coordination services at no cost or copay. I was unable to reach you by phone today but would be happy to help you with your health related needs. Please feel free to call me @ [phone].   A member of the Managed Medicaid care management team will reach out to you again over the next 21 days.   Lurena Joiner RN, BSN [place]  [employer] manager

## 2021-08-01 ENCOUNTER — Other Ambulatory Visit: Payer: Self-pay

## 2021-08-01 ENCOUNTER — Other Ambulatory Visit: Payer: Medicaid Other

## 2021-08-01 DIAGNOSIS — Z113 Encounter for screening for infections with a predominantly sexual mode of transmission: Secondary | ICD-10-CM | POA: Diagnosis not present

## 2021-08-01 DIAGNOSIS — Z79899 Other long term (current) drug therapy: Secondary | ICD-10-CM

## 2021-08-01 DIAGNOSIS — B20 Human immunodeficiency virus [HIV] disease: Secondary | ICD-10-CM

## 2021-08-01 DIAGNOSIS — Z7689 Persons encountering health services in other specified circumstances: Secondary | ICD-10-CM | POA: Diagnosis not present

## 2021-08-02 DIAGNOSIS — M1990 Unspecified osteoarthritis, unspecified site: Secondary | ICD-10-CM | POA: Diagnosis not present

## 2021-08-02 LAB — RPR: RPR Ser Ql: NONREACTIVE

## 2021-08-02 LAB — LIPID PANEL
Cholesterol: 221 mg/dL — ABNORMAL HIGH (ref <200)
HDL: 75 mg/dL (ref >=50)
LDL Cholesterol (Calc): 131 mg/dL (calc) — ABNORMAL HIGH
Non-HDL Cholesterol (Calc): 146 mg/dL (calc) — ABNORMAL HIGH (ref <130)
Total CHOL/HDL Ratio: 2.9 (calc) (ref <5.0)
Triglycerides: 63 mg/dL (ref <150)

## 2021-08-02 LAB — CBC WITH DIFFERENTIAL/PLATELET
Absolute Monocytes: 459 cells/uL (ref 200–950)
Basophils Absolute: 22 cells/uL (ref 0–200)
Basophils Relative: 0.4 %
Eosinophils Absolute: 50 cells/uL (ref 15–500)
Eosinophils Relative: 0.9 %
HCT: 33 % — ABNORMAL LOW (ref 35.0–45.0)
Hemoglobin: 11.3 g/dL — ABNORMAL LOW (ref 11.7–15.5)
Lymphs Abs: 2363 cells/uL (ref 850–3900)
MCH: 32.1 pg (ref 27.0–33.0)
MCHC: 34.2 g/dL (ref 32.0–36.0)
MCV: 93.8 fL (ref 80.0–100.0)
MPV: 9.8 fL (ref 7.5–12.5)
Monocytes Relative: 8.2 %
Neutro Abs: 2705 cells/uL (ref 1500–7800)
Neutrophils Relative %: 48.3 %
Platelets: 312 10*3/uL (ref 140–400)
RBC: 3.52 10*6/uL — ABNORMAL LOW (ref 3.80–5.10)
RDW: 13.1 % (ref 11.0–15.0)
Total Lymphocyte: 42.2 %
WBC: 5.6 10*3/uL (ref 3.8–10.8)

## 2021-08-02 LAB — COMPLETE METABOLIC PANEL WITH GFR
AG Ratio: 1.7 (calc) (ref 1.0–2.5)
ALT: 7 U/L (ref 6–29)
AST: 14 U/L (ref 10–35)
Albumin: 4.3 g/dL (ref 3.6–5.1)
Alkaline phosphatase (APISO): 46 U/L (ref 37–153)
BUN: 14 mg/dL (ref 7–25)
CO2: 25 mmol/L (ref 20–32)
Calcium: 9.4 mg/dL (ref 8.6–10.4)
Chloride: 105 mmol/L (ref 98–110)
Creat: 0.94 mg/dL (ref 0.50–1.05)
Globulin: 2.5 g/dL (calc) (ref 1.9–3.7)
Glucose, Bld: 88 mg/dL (ref 65–99)
Potassium: 4.3 mmol/L (ref 3.5–5.3)
Sodium: 139 mmol/L (ref 135–146)
Total Bilirubin: 0.5 mg/dL (ref 0.2–1.2)
Total Protein: 6.8 g/dL (ref 6.1–8.1)
eGFR: 69 mL/min/{1.73_m2} (ref >=60)

## 2021-08-02 LAB — HIV-1 RNA QUANT-NO REFLEX-BLD
HIV 1 RNA Quant: <20 Copies/mL — ABNORMAL HIGH
HIV-1 RNA Quant, Log: <1.3 Log cps/mL — ABNORMAL HIGH

## 2021-08-02 LAB — T-HELPER CELL (CD4) - (RCID CLINIC ONLY)
CD4 % Helper T Cell: 39 % (ref 33–65)
CD4 T Cell Abs: 899 /uL (ref 400–1790)

## 2021-08-03 ENCOUNTER — Other Ambulatory Visit (HOSPITAL_COMMUNITY): Payer: Self-pay

## 2021-08-08 ENCOUNTER — Other Ambulatory Visit: Payer: Self-pay | Admitting: Internal Medicine

## 2021-08-08 DIAGNOSIS — B20 Human immunodeficiency virus [HIV] disease: Secondary | ICD-10-CM

## 2021-08-08 DIAGNOSIS — M1990 Unspecified osteoarthritis, unspecified site: Secondary | ICD-10-CM | POA: Diagnosis not present

## 2021-08-08 NOTE — Telephone Encounter (Signed)
Refill Request   megestrol (MEGACE) 40 MG tablet  Pt requesting her medication to go to the following Pharmacy instead of [facility] as it is closer to her home:   [facility] Address: [address], [place], [place] [address] Phone: [phone]

## 2021-08-09 ENCOUNTER — Other Ambulatory Visit (HOSPITAL_COMMUNITY): Payer: Self-pay

## 2021-08-09 DIAGNOSIS — M1990 Unspecified osteoarthritis, unspecified site: Secondary | ICD-10-CM | POA: Diagnosis not present

## 2021-08-09 DIAGNOSIS — J3089 Other allergic rhinitis: Secondary | ICD-10-CM | POA: Diagnosis not present

## 2021-08-09 DIAGNOSIS — J301 Allergic rhinitis due to pollen: Secondary | ICD-10-CM | POA: Diagnosis not present

## 2021-08-09 DIAGNOSIS — Z7689 Persons encountering health services in other specified circumstances: Secondary | ICD-10-CM | POA: Diagnosis not present

## 2021-08-09 MED ORDER — MEGESTROL ACETATE 40 MG PO TABS: 40.0000 mg | ORAL_TABLET | Freq: Every day | ORAL | 1 refills | Status: DC

## 2021-08-11 ENCOUNTER — Ambulatory Visit (INDEPENDENT_AMBULATORY_CARE_PROVIDER_SITE_OTHER): Payer: Medicaid Other | Admitting: Internal Medicine

## 2021-08-11 ENCOUNTER — Telehealth: Payer: Self-pay | Admitting: *Deleted

## 2021-08-11 ENCOUNTER — Other Ambulatory Visit: Payer: Self-pay

## 2021-08-11 ENCOUNTER — Encounter: Payer: Self-pay | Admitting: Internal Medicine

## 2021-08-11 VITALS — BP 114/90 | HR 82 | Temp 98.8°F | Resp 28 | Ht 63.5 in | Wt 98.0 lb

## 2021-08-11 DIAGNOSIS — Z7689 Persons encountering health services in other specified circumstances: Secondary | ICD-10-CM | POA: Diagnosis not present

## 2021-08-11 DIAGNOSIS — F1190 Opioid use, unspecified, uncomplicated: Secondary | ICD-10-CM

## 2021-08-11 DIAGNOSIS — Z23 Encounter for immunization: Secondary | ICD-10-CM

## 2021-08-11 DIAGNOSIS — E785 Hyperlipidemia, unspecified: Secondary | ICD-10-CM

## 2021-08-11 DIAGNOSIS — R634 Abnormal weight loss: Secondary | ICD-10-CM | POA: Diagnosis not present

## 2021-08-11 DIAGNOSIS — J301 Allergic rhinitis due to pollen: Secondary | ICD-10-CM | POA: Diagnosis not present

## 2021-08-11 MED ORDER — MEGESTROL ACETATE 40 MG/ML PO SUSP: 400.0000 mg | Freq: Every day | ORAL | 1 refills | Status: DC

## 2021-08-11 MED ORDER — PRAVASTATIN SODIUM 40 MG PO TABS: 40.0000 mg | ORAL_TABLET | Freq: Every day | ORAL | 4 refills | Status: DC

## 2021-08-11 MED ORDER — ZOSTER VAC RECOMB ADJUVANTED 50 MCG/0.5ML IM SUSR: 0.5000 mL | Freq: Once | INTRAMUSCULAR | 1 refills | Status: AC

## 2021-08-11 MED ORDER — TRAMADOL HCL 50 MG PO TABS: 50.0000 mg | ORAL_TABLET | Freq: Two times a day (BID) | ORAL | 2 refills | Status: DC | PRN

## 2021-08-11 MED ORDER — FLUTICASONE PROPIONATE 50 MCG/ACT NA SUSP: 1.0000 | Freq: Every day | NASAL | 3 refills | Status: DC

## 2021-08-11 NOTE — Telephone Encounter (Signed)
Received call from Pharmacist at [facility] stating their system will not allow Resident's DEA to go through. Requesting Tramadol to be resent by Attending.

## 2021-08-11 NOTE — Progress Notes (Signed)
   CC: Chronic continuous use of opioids, need for shingles vaccine, weight loss, hyperlipidemia, seasonal allergic rhinitis  HPI:Janice Williams is a 60 y.o. female who presents for evaluation of chronic continuous use of opioids, need for shingles vaccine, weight loss, hyperlipidemia, seasonal allergic rhinitis . Please see individual problem based A/P for details.  Past Medical History:  Diagnosis Date   Allergy    Anemia    Arthritis    lower back, knees   Bronchitis    COPD (chronic obstructive pulmonary disease) (HCC)    COPD with acute exacerbation ([place]) 06/09/2017   Dental abscess 03/15/2015   DYSPEPSIA 02/14/2007   Qualifier: Diagnosis of  By: Samara Snide     EXTERNAL OTITIS 01/06/2010   Qualifier: Diagnosis of  By: Tomma Lightning MD, Claiborne Billings     FACIAL RASH 02/04/2009   Qualifier: Diagnosis of  By: Rhunette Croft     Former smoker    quit 2014   GERD (gastroesophageal reflux disease)    HIV infection ([place])    Hypertension    Stroke ([place])    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 Negative for chills and fever.  Respiratory:  Negative for cough and sputum production.   Cardiovascular:  Negative for chest pain and palpitations.    Physical Exam: Vitals:   08/11/21 0905  BP: 114/90  Pulse: 82  Resp: (!) 28  Temp: 98.8 F (37.1 C)  TempSrc: Oral  SpO2: 100%  Weight: 98 lb (44.5 kg)  Height: 5' 3.5" (1.613 m)   General: underweight , NAD , happy and has granddaughter with her today.  HEENT: Conjunctiva nl , antiicteric sclerae, moist mucous membranes, no exudate or erythema Cardiovascular: Normal rate, regular rhythm.  No murmurs, rubs, or gallops Pulmonary : Equal breath sounds, No wheezes, rales, or rhonchi Abdominal: soft, nontender,  bowel sounds present Ext: No edema in lower extremities, no tenderness to palpation of lower  extremities.   Assessment & Plan:   See Encounters Tab for problem based charting.  Patient discussed with Dr. Dareen Piano

## 2021-08-11 NOTE — Patient Instructions (Addendum)
Thank you for trusting me with your care. To recap, today we discussed the following:  - traMADol (ULTRAM) 50 MG tablet; Take 1 tablet (50 mg total) by mouth every 12 (twelve) hours as needed.  Dispense: 60 tablet; Refill: 2  - Zoster Vaccine Adjuvanted [facility]) injection; Inject 0.5 mLs into the muscle once for 1 dose.  Dispense: 0.5 mL; Refill: 1  - megestrol (MEGACE) 40 MG/ML suspension; Take 10 mLs (400 mg total) by mouth daily.  Dispense: 480 mL; Refill: 1   - pravastatin (PRAVACHOL) 40 MG tablet; Take 1 tablet (40 mg total) by mouth daily.  Dispense: 30 tablet; Refill: 4

## 2021-08-14 ENCOUNTER — Encounter: Payer: Self-pay | Admitting: Internal Medicine

## 2021-08-14 DIAGNOSIS — Z23 Encounter for immunization: Secondary | ICD-10-CM | POA: Insufficient documentation

## 2021-08-14 NOTE — Assessment & Plan Note (Signed)
-   Zoster Vaccine Adjuvanted [facility]) injection; Inject 0.5 mLs into the muscle once for 1 dose.  Dispense: 0.5 mL; Refill: 1

## 2021-08-14 NOTE — Assessment & Plan Note (Signed)
Wt Readings from Last 3 Encounters:  08/11/21 98 lb (44.5 kg)  05/12/21 103 lb 8 oz (46.9 kg)  04/21/21 101 lb (45.8 kg)  Patient continues to have weight loss and does not have appetite. On low dose of megestrol. Will increase dosing today. Warned of possible side effects of blood clotting with this medication. - megestrol (MEGACE) 40 MG/ML suspension; Take 10 mLs (400 mg total) by mouth daily.  Dispense: 480 mL; Refill: 1

## 2021-08-14 NOTE — Assessment & Plan Note (Addendum)
-   PDMP reviewed and appropriate. No side effects from medication. Medication helps patient stay active . Will need UDS at next visit, for yearly monitoring. Refilled by attending, [facility] not accepting residents license.

## 2021-08-14 NOTE — Assessment & Plan Note (Signed)
Refilled - fluticasone (FLONASE) 50 MCG/ACT nasal spray; Place 1 spray into both nostrils daily.  Dispense: 16 g; Refill: 3

## 2021-08-14 NOTE — Assessment & Plan Note (Addendum)
Hx of CVA. LDL 131. She is on Pravastatin 20 mg. No interaction between Pravastatin and Biktarvy per [facility]. Will increase dose to 40 mg. Check Lipid panel at next visit. Can switch to Crestor if moderate intensity statin does no reduce LDL < 70

## 2021-08-15 ENCOUNTER — Encounter: Payer: Medicaid Other | Admitting: Internal Medicine

## 2021-08-15 DIAGNOSIS — H1045 Other chronic allergic conjunctivitis: Secondary | ICD-10-CM | POA: Diagnosis not present

## 2021-08-15 DIAGNOSIS — J301 Allergic rhinitis due to pollen: Secondary | ICD-10-CM | POA: Diagnosis not present

## 2021-08-15 DIAGNOSIS — M1990 Unspecified osteoarthritis, unspecified site: Secondary | ICD-10-CM | POA: Diagnosis not present

## 2021-08-15 DIAGNOSIS — J3089 Other allergic rhinitis: Secondary | ICD-10-CM | POA: Diagnosis not present

## 2021-08-15 DIAGNOSIS — J4530 Mild persistent asthma, uncomplicated: Secondary | ICD-10-CM | POA: Diagnosis not present

## 2021-08-15 DIAGNOSIS — Z7689 Persons encountering health services in other specified circumstances: Secondary | ICD-10-CM | POA: Diagnosis not present

## 2021-08-15 NOTE — Progress Notes (Signed)
Internal Medicine Clinic Attending  Case discussed with Dr. Steen  At the time of the visit.  We reviewed the resident's history and exam and pertinent patient test results.  I agree with the assessment, diagnosis, and plan of care documented in the resident's note.  

## 2021-08-16 DIAGNOSIS — M1990 Unspecified osteoarthritis, unspecified site: Secondary | ICD-10-CM | POA: Diagnosis not present

## 2021-08-18 ENCOUNTER — Other Ambulatory Visit: Payer: Self-pay

## 2021-08-18 ENCOUNTER — Other Ambulatory Visit: Payer: Self-pay | Admitting: *Deleted

## 2021-08-18 DIAGNOSIS — Z419 Encounter for procedure for purposes other than remedying health state, unspecified: Secondary | ICD-10-CM | POA: Diagnosis not present

## 2021-08-18 NOTE — Patient Outreach (Signed)
Medicaid Managed Care   Nurse Care Manager Note  08/18/2021 Name:  Janice Williams MRN:  [id number] DOB:  Nov 22, 1961  Janice Williams is an 60 y.o. year old female who is a primary patient of Madalyn Rob, MD.  The [facility] Managed Care Coordination team was consulted for assistance with:    COPD  Janice Williams was given information about Medicaid Managed Care Coordination team services today. Janice Williams Patient agreed to services and verbal consent obtained.  Engaged with patient by telephone for follow up visit in response to provider referral for case management and/or care coordination services.   Assessments/Interventions:  Review of past medical history, allergies, medications, health status, including review of consultants reports, laboratory and other test data, was performed as part of comprehensive evaluation and provision of chronic care management services.  SDOH (Social Determinants of Health) assessments and interventions performed: SDOH Interventions    Flowsheet Row Most Recent Value  SDOH Interventions   Food Insecurity Interventions Intervention Not Indicated  Housing Interventions Intervention Not Indicated  Transportation Interventions Intervention Not Indicated       Care Plan  Allergies  Allergen Reactions   Chantix [Varenicline] Itching    Medications Reviewed Today     Reviewed by Melissa Montane, RN (Registered Nurse) on 08/18/21 at 1129  Med List Status: <None>   Medication Order Taking? Sig Documenting Provider Last Dose Status Informant  albuterol (VENTOLIN HFA) 108 (90 Base) MCG/ACT inhaler [id number] Yes Inhale 2 puffs into the lungs every 6 (six) hours as needed for wheezing or shortness of breath. [provider] Taking Active Self  amLODipine (NORVASC) 10 MG tablet [id number] Yes TAKE 1 TABLET(10 MG) BY MOUTH DAILY Christian, Rylee, MD Taking Active   bictegravir-emtricitabine-tenofovir AF (BIKTARVY) 50-200-25 MG TABS tablet [id number] Yes  TAKE 1 TABLET BY MOUTH DAILY. Carlyle Basques, MD Taking Active   BIKTARVY 50-200-25 MG TABS tablet [id number] Yes TAKE 1 TABLET BY MOUTH DAILY. Carlyle Basques, MD Taking Consider Medication Status and Discontinue (Completed Course)   budesonide-formoterol (SYMBICORT) 80-4.5 MCG/ACT inhaler [id number] Yes TAKE 2 PUFFS FIRST THING IN THE MORNING AND THEN ANOTHER 2 PUFFS 12 HOURS LATER Tanda Rockers, MD Taking Consider Medication Status and Discontinue (Completed Course)   budesonide-formoterol (SYMBICORT) 80-4.5 MCG/ACT inhaler [id number] Yes Inhale 2 puffs into the lungs 2 (two) times daily. Tanda Rockers, MD Taking Active   doxycycline (VIBRAMYCIN) 100 MG capsule [id number] No Take 1 capsule (100 mg total) by mouth 2 (two) times daily.  Patient not taking: Reported on 08/18/2021   Terrilee Croak, PA-C Not Taking Active            Med Note Thamas Jaegers, Rajvir Ernster A   Thu Aug 18, 2021 11:24 AM) Completed  Ensure (ENSURE) [id number] Yes Take 237 mLs by mouth 3 (three) times daily between meals. Carlyle Basques, MD Taking Active   fluticasone [facility]) 50 MCG/ACT nasal spray [id number] Yes Place 1 spray into both nostrils daily. Madalyn Rob, MD Taking Active   levocetirizine (XYZAL) 5 MG tablet [id number] Yes Take 5 mg by mouth every evening. [provider] Taking Active Self  megestrol (MEGACE) 40 MG/ML suspension [id number] Yes Take 10 mLs (400 mg total) by mouth daily. Madalyn Rob, MD Taking Active   methylPREDNISolone (MEDROL DOSEPAK) 4 MG TBPK tablet [id number] No As directed  Patient not taking: Reported on 08/18/2021   Raspet, Derry Skill, PA-C Not Taking Active            Med Note (Aaliya Maultsby,  Carless Slatten A   Thu Aug 18, 2021 11:25 AM) Completed   montelukast (SINGULAIR) 10 MG tablet [id number] No TAKE 1 TABLET(10 MG) BY MOUTH AT BEDTIME  Patient not taking: Reported on 08/18/2021   Sid Falcon, MD Not Taking Active   omeprazole (PRILOSEC) 20 MG capsule [id number] Yes Take 1 capsule (20 mg total) by mouth  daily. Dewayne Hatch, MD Taking Active   polyethylene glycol [facility] / GLYCOLAX) packet [id number] No Take one capful in 8 oz of fluid of your choice twice a day until having soft stools, then back down to once a day.  If not having soft stools with twice a day, increased to three times a day  Patient not taking: No sig reported   Linton Flemings, MD Not Taking Active            Med Note Vita Barley Nov 15, 2020 11:04 AM)    pravastatin (PRAVACHOL) 40 MG tablet [id number] Yes Take 1 tablet (40 mg total) by mouth daily. Madalyn Rob, MD Taking Active   PRESCRIPTION MEDICATION [id number] Yes Inject 1 Dose into the skin once a week. Allergy shot in office on Tuesdays [provider] Taking Active Self  senna-docusate (SENOKOT-S) 8.6-50 MG tablet [id number] Yes Take 1 tablet by mouth daily. May adjust to achieve 1 bowel movement daily Andrew Au, MD Taking Active   traMADol Veatrice Bourbon) 50 MG tablet [id number] Yes Take 1 tablet (50 mg total) by mouth every 12 (twelve) hours as needed. Aldine Contes, MD Taking Active             Patient Active Problem List   Diagnosis Date Noted   Need for shingles vaccine 08/14/2021   Constipation 05/12/2021   Cough 03/30/2021   Screening for cervical cancer 03/18/2021   Ear pain, bilateral 03/17/2021   Degenerative disc disease, cervical 10/13/2020   Chronic, continuous use of opioids 09/10/2020   Rhinitis, chronic 06/10/2020   Restless legs syndrome (RLS) 01/08/2020   Osteoporosis 10/10/2019   COPD gold ?  07/08/2019   Moderate protein-calorie malnutrition ([place]) 05/31/2018   Hypertension 06/08/2017   Left knee pain 03/15/2015   Healthcare maintenance 07/06/2011   Hyperlipidemia 06/03/2009   WEIGHT LOSS 06/03/2009   Human immunodeficiency virus (HIV) disease ([place]) 10/29/2008   GERD 10/29/2008   CEREBROVASCULAR ACCIDENT, HX OF 10/29/2008   TOBACCO DEPENDENCE 02/14/2007   Allergic rhinitis 02/14/2007   Symptoms concerning  nutrition, metabolism, and development 02/14/2007    Conditions to be addressed/monitored per PCP order:  COPD  Care Plan : COPD (Adult)  Updates made by Melissa Montane, RN since 08/18/2021 12:00 AM     Problem: Disease Progression (COPD)      Long-Range Goal: Disease Progression Minimized or Managed   Start Date: 01/03/2021  Expected End Date: 09/20/2021  Recent Progress: On track  Priority: Medium  Note:   Current Barriers:  Chronic Disease Management support and education needs related to COPD-Patient feels that she is managing her health at home very well. She reports needing a raised toilet for ease and mobility. 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 Janice Williams has a [employer] aide 7 days a week for a couple of hours a day to assistance with bathing, light house duties and shopping. Janice Williams received the requested bedside commode.-Update-Janice Williams is dealing with the recent loss of her father. She has strong support from her church. She is dealing with constipation and  does not get relief from prescribed medications. Nurse Case Manager Clinical Goal(s):  patient will verbalize understanding of plan for managing COPD patient will attend all scheduled medical appointments the patient will demonstrate ongoing self health care management ability as evidenced by taking medications as prescribed, notifying provider with any concerns or changes in symptoms, work on breathing exercises daily Interventions:  Inter-disciplinary care team collaboration (see longitudinal plan of care) Evaluation of current treatment plan related to COPD and patient's adherence to plan as established by provider. Discussed plans with patient for ongoing care management follow up and provided patient with direct contact information for care management team Provided patient with mychart educational materials related to constipation Advised patient to incorporate more fiber  into her diet Provided therapeutic listening Advised patient to call [facility] [phone] for monthly OTC benefits Reviewed medications  Patient Goals/Self-Care Activities - increase fiber in your diet - contact [employer] [phone] for monthly OTC benefits - attend all scheduled appointments - take all medication as prescribed - contact your provider with any questions, concerns or changes in symptoms - do breathing exercises every day - do exercises in a comfortable position that makes breathing as easy as possible  Follow Up Plan: Telephone follow up appointment with Managed Medicaid care management team member scheduled for:09/20/21 @ 10:30am         Follow Up:  Patient agrees to Care Plan and Follow-up.  Plan: The Managed Medicaid care management team will reach out to the patient again over the next 30 days.  Date/time of next scheduled RN care management/care coordination outreach:  09/20/21 @ 10:30am  Lurena Joiner RN, BSN [place] RN Care Coordinator

## 2021-08-18 NOTE — Patient Instructions (Signed)
Visit Information  Janice Williams was given information about Medicaid Managed Care team care coordination services as a part of their [facility] Medicaid benefit. Janice Williams verbally consented to engagement with the [facility] Managed Care team.   If you are experiencing a medical emergency, please call 911 or report to your local emergency department or urgent care.   If you have a non-emergency medical problem during routine business hours, please contact your provider's office and ask to speak with a nurse.   For questions related to your [facility] health plan, please call: [phone] or go here:[email or web address]  If you would like to schedule transportation through your [facility] plan, please call the following number at least 2 days in advance of your appointment: [phone].  Call the [place] at [phone], at any time, 24 hours a day, 7 days a week. If you are in danger or need immediate medical attention call 911.  If you would like help to quit smoking, call 1-800-QUIT-NOW 607-313-6323) OR Espaol: 1-855-Djelo-Ya HD:1601594) o para Janice informacin haga clic aqu or Text READY to 200-400 to register via text  Janice Williams - following are the goals we discussed in your visit today:   Goals Addressed             This Visit's Progress    Learn and Do Breathing Exercises-COPD       Timeframe:  Long-Range Goal Priority:  Medium Start Date:    01/03/21                         Expected End Date: 09/20/21       Follow up 09/20/21           - increase fiber in your diet - contact [facility] [phone] for monthly OTC benefits - attend all scheduled appointments - take all medication as prescribed - contact your provider with any questions, concerns or changes in symptoms - do breathing exercises every day - do exercises in a comfortable position that makes breathing as easy as possible    Why is this important?    Breathing exercises can help lessen the cough that comes with chronic obstructive pulmonary disease.  Doing the exercises will give you more energy.  They will also help you to control your symptoms.             Please see education materials related to constipation and high fiber foods provided by MyChart link.  Patient can assess provided education via MyChart  Telephone follow up appointment with Managed Medicaid care management team member scheduled for:09/20/21 @ 10:30am  Janice Joiner RN, BSN [place] RN Care Coordinator   Following is a copy of your plan of care:  Patient Care Plan: COPD (Adult)     Problem Identified: Disease Progression (COPD)      Long-Range Goal: Disease Progression Minimized or Managed   Start Date: 01/03/2021  Expected End Date: 09/20/2021  Recent Progress: On track  Priority: Medium  Note:   Current Barriers:  Chronic Disease Management support and education needs related to COPD-Patient feels that she is managing her health at home very well. She reports needing a raised toilet for ease and mobility. 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 Janice Williams has a [employer] aide 7 days a week for a couple of hours a day to assistance with bathing, light house duties and shopping.  Janice Williams received the requested bedside commode.-Update-Janice Williams is dealing with the recent loss of her father. She has strong support from her church. She is dealing with constipation and does not get relief from prescribed medications. Nurse Case Manager Clinical Goal(s):  patient will verbalize understanding of plan for managing COPD patient will attend all scheduled medical appointments the patient will demonstrate ongoing self health care management ability as evidenced by taking medications as prescribed, notifying provider with any concerns or changes in symptoms, work on breathing exercises  daily Interventions:  Inter-disciplinary care team collaboration (see longitudinal plan of care) Evaluation of current treatment plan related to COPD and patient's adherence to plan as established by provider. Discussed plans with patient for ongoing care management follow up and provided patient with direct contact information for care management team Provided patient with mychart educational materials related to constipation Advised patient to incorporate more fiber into her diet Provided therapeutic listening Advised patient to call [facility] [phone] for monthly OTC benefits Reviewed medications  Patient Goals/Self-Care Activities - increase fiber in your diet - contact [facility] [phone] for monthly OTC benefits - attend all scheduled appointments - take all medication as prescribed - contact your provider with any questions, concerns or changes in symptoms - do breathing exercises every day - do exercises in a comfortable position that makes breathing as easy as possible  Follow Up Plan: Telephone follow up appointment with Managed Medicaid care management team member scheduled for:09/20/21 @ 10:30am

## 2021-08-23 DIAGNOSIS — J301 Allergic rhinitis due to pollen: Secondary | ICD-10-CM | POA: Diagnosis not present

## 2021-08-23 DIAGNOSIS — J3089 Other allergic rhinitis: Secondary | ICD-10-CM | POA: Diagnosis not present

## 2021-08-23 DIAGNOSIS — Z7689 Persons encountering health services in other specified circumstances: Secondary | ICD-10-CM | POA: Diagnosis not present

## 2021-08-29 DIAGNOSIS — M1990 Unspecified osteoarthritis, unspecified site: Secondary | ICD-10-CM | POA: Diagnosis not present

## 2021-08-30 DIAGNOSIS — M1990 Unspecified osteoarthritis, unspecified site: Secondary | ICD-10-CM | POA: Diagnosis not present

## 2021-08-30 DIAGNOSIS — Z7689 Persons encountering health services in other specified circumstances: Secondary | ICD-10-CM | POA: Diagnosis not present

## 2021-09-02 ENCOUNTER — Other Ambulatory Visit (HOSPITAL_COMMUNITY): Payer: Self-pay

## 2021-09-02 ENCOUNTER — Other Ambulatory Visit: Payer: Self-pay | Admitting: Internal Medicine

## 2021-09-02 DIAGNOSIS — Z7689 Persons encountering health services in other specified circumstances: Secondary | ICD-10-CM | POA: Diagnosis not present

## 2021-09-02 DIAGNOSIS — J3089 Other allergic rhinitis: Secondary | ICD-10-CM | POA: Diagnosis not present

## 2021-09-02 DIAGNOSIS — J301 Allergic rhinitis due to pollen: Secondary | ICD-10-CM | POA: Diagnosis not present

## 2021-09-02 MED ORDER — BIKTARVY 50-200-25 MG PO TABS
1.0000 | ORAL_TABLET | Freq: Every day | ORAL | 2 refills | Status: DC
  Filled 2021-09-02: qty 30, 30d supply, fill #0
  Filled 2021-10-04: qty 30, 30d supply, fill #1
  Filled 2021-10-31: qty 30, 30d supply, fill #2

## 2021-09-05 DIAGNOSIS — M1990 Unspecified osteoarthritis, unspecified site: Secondary | ICD-10-CM | POA: Diagnosis not present

## 2021-09-06 DIAGNOSIS — M1990 Unspecified osteoarthritis, unspecified site: Secondary | ICD-10-CM | POA: Diagnosis not present

## 2021-09-07 ENCOUNTER — Other Ambulatory Visit (HOSPITAL_COMMUNITY): Payer: Self-pay

## 2021-09-09 DIAGNOSIS — J3089 Other allergic rhinitis: Secondary | ICD-10-CM | POA: Diagnosis not present

## 2021-09-09 DIAGNOSIS — J301 Allergic rhinitis due to pollen: Secondary | ICD-10-CM | POA: Diagnosis not present

## 2021-09-09 DIAGNOSIS — Z7689 Persons encountering health services in other specified circumstances: Secondary | ICD-10-CM | POA: Diagnosis not present

## 2021-09-12 ENCOUNTER — Other Ambulatory Visit: Payer: Self-pay

## 2021-09-12 DIAGNOSIS — M1990 Unspecified osteoarthritis, unspecified site: Secondary | ICD-10-CM | POA: Diagnosis not present

## 2021-09-12 DIAGNOSIS — I10 Essential (primary) hypertension: Secondary | ICD-10-CM

## 2021-09-13 DIAGNOSIS — M1990 Unspecified osteoarthritis, unspecified site: Secondary | ICD-10-CM | POA: Diagnosis not present

## 2021-09-13 MED ORDER — AMLODIPINE BESYLATE 10 MG PO TABS: ORAL_TABLET | ORAL | 1 refills | Status: DC

## 2021-09-14 ENCOUNTER — Other Ambulatory Visit: Payer: Self-pay

## 2021-09-14 ENCOUNTER — Encounter: Payer: Self-pay | Admitting: Internal Medicine

## 2021-09-14 ENCOUNTER — Ambulatory Visit (INDEPENDENT_AMBULATORY_CARE_PROVIDER_SITE_OTHER): Payer: Medicaid Other | Admitting: Internal Medicine

## 2021-09-14 DIAGNOSIS — J449 Chronic obstructive pulmonary disease, unspecified: Secondary | ICD-10-CM

## 2021-09-14 DIAGNOSIS — Z7689 Persons encountering health services in other specified circumstances: Secondary | ICD-10-CM | POA: Diagnosis not present

## 2021-09-14 LAB — PULMONARY FUNCTION TEST
DL/VA % pred: 83 %
DL/VA: 3.44 ml/min/mmHg/L
DLCO cor % pred: 54 %
DLCO cor: 11.93 ml/min/mmHg
DLCO unc % pred: 50 %
DLCO unc: 11.08 ml/min/mmHg
FEF 25-75 Post: 0.28 L/sec
FEF 25-75 Pre: 0.3 L/sec
FEF2575-%Change-Post: -7 %
FEF2575-%Pred-Post: 12 %
FEF2575-%Pred-Pre: 13 %
FEV1-%Change-Post: -1 %
FEV1-%Pred-Post: 31 %
FEV1-%Pred-Pre: 32 %
FEV1-Post: 0.73 L
FEV1-Pre: 0.74 L
FEV1FVC-%Change-Post: 4 %
FEV1FVC-%Pred-Pre: 51 %
FEV6-%Change-Post: -2 %
FEV6-%Pred-Post: 57 %
FEV6-%Pred-Pre: 59 %
FEV6-Post: 1.63 L
FEV6-Pre: 1.67 L
FEV6FVC-%Change-Post: 2 %
FEV6FVC-%Pred-Post: 98 %
FEV6FVC-%Pred-Pre: 95 %
FVC-%Change-Post: -5 %
FVC-%Pred-Post: 58 %
FVC-%Pred-Pre: 61 %
FVC-Post: 1.72 L
FVC-Pre: 1.81 L
Post FEV1/FVC ratio: 43 %
Post FEV6/FVC ratio: 95 %
Pre FEV1/FVC ratio: 41 %
Pre FEV6/FVC Ratio: 93 %
RV % pred: 217 %
RV: 4.63 L
TLC % pred: 122 %
TLC: 6.67 L

## 2021-09-14 NOTE — Progress Notes (Signed)
Janice Williams, female    DOB: Dec 10, 1961,    MRN: [id number]   Brief patient profile:  87 yobf  HIV 2008 Quit smoking in 2014 cough / sob rx saba then symbicort which helped but still coughing so referred to pulmonary clinic 07/08/2019 by [name]   Started allergy shots Aug 2021  Plus zyrtec from Dr [name]     History of Present Illness  07/08/2019  Pulmonary/ 1st office eval/[name]  Chief Complaint  Patient presents with   Pulmonary Consult    Referred by [name], NP for eval of COPD. Pt c/o cough for the past several months- occ prod with clear sputum. She uses her albuterol inhaler about 2 x per day.   Dyspnea:  MMRC3 = can't walk 100 yards even at a slow pace at a flat grade s stopping due to sob  mb to house and has to stop landing to catch here back slt uphill and 2 steps to top landing  Cough: starts a few hours p supper much worse lying > min clear mucus Sleep: p drinking water settles down able to sleep s noct or am flare  SABA use: helps  pred def helps Hb variable, uses ppi prn  rec Prednisone 10 mg take  4 each am x 2 days,   2 each am x 2 days,  1 each am x 2 days and stop  Plan A = Automatic = symbicort 80 Take 2 puffs first thing in am and then another 2 puffs about 12 hours later.  Work on inhaler technique:    Plan B = Backup Only use your albuterol inhaler as a rescue medication   08/11/2019  f/u ov/Rockwell Zentz re: presumed copd / maint on symb 80 2bid though hfa poor Chief Complaint  Patient presents with   Follow-up    Breathing is doing well and she has not used her rescue inhaler.   Dyspnea:  MMRC2 = can't walk a nl pace on a flat grade s sob but does fine slow and flat  Cough: none Sleeping: able to lie flat bed with 2 pillows  SABA use: very little  rec No change in medications Work on inhaler technique:  06/02/20 ER eval for aecopd > doxy / pred   06/09/2020  f/u post ER ov/[name] re: copd presumed / flare with nasal congestion  on doxy no  prednisone  Chief Complaint  Patient presents with   Follow-up   Dyspnea:  MMRC2 = can't walk a nl pace on a flat grade s sob but does fine slow and flat  Cough:  Rattling congested light green improving on doxy / did not take pred yet "too strong"  Sleeping: 30 degrees on flat bed with pillows SABA use: rarely using but when she does it's "after she overdoes it"  02: none rec Plan A = Automatic = Always=    Breztri or symbicort 160 Take 2 puffs first thing in am and then another 2 puffs about 12 hours later.  Work on inhaler technique:   Prednisone one half daily until gone  with bfast and finish the doxy   Plan B = Backup (to supplement plan A, not to replace it) Only use your albuterol inhaler as a rescue medication  Please remember to go to the  x-ray department  for your tests - we will call you with the results when they are available  clariton d is as needed when nose is clogged  Please schedule a  follow up visit in 3 months but call sooner if needed with pfts     09/14/2020  f/u ov/[name] re: copd ? Gold level/  symb 80 2bid  Chief Complaint  Patient presents with   Follow-up    Patient is feeling good since last visit. Has slight cough in the morning with green sputum and then it is clear the rest of the day.    Dyspnea: MMRC2 = can't walk a nl pace on a flat grade s sob but does fine slow and flat  Cough: just in am's a little green mucus occ in am's then  good to go the rest of the day Sleeping: 30 degrees pillows  SABA use: none  02: none  Rec No change Pfts > not done   04/21/21 NP rec Flutter/ breztri trial > no better so resumed symbicort 80   09/14/2021  f/u ov/[name] re: COPD GOLD 3    maint on symb 80 /allergy shots per sharma  Chief Complaint  Patient presents with   Follow-up    Dyspnea:  vacuum/  MMRC2 = can't walk a nl pace on a flat grade s sob but does fine slow and flat e Cough: none now  Sleeping: bed is flat, 30 degrees  SABA use: one maybe 02: none   Covid status:   vax x 4    No obvious day to day or daytime variability or assoc excess/ purulent sputum or mucus plugs or hemoptysis or cp or chest tightness, subjective wheeze or overt   hb symptoms.   Sleeping as above  without nocturnal  or early am exacerbation  of respiratory  c/o's or need for noct saba. Also denies any obvious fluctuation of symptoms with weather or environmental changes or other aggravating or alleviating factors except as outlined above   No unusual exposure hx or h/o childhood pna/ asthma or knowledge of premature birth.  Current Allergies, Complete Past Medical History, Past Surgical History, Family History, and Social History were reviewed in [employer] record.  ROS  The following are not active complaints unless bolded Hoarseness, sore throat, dysphagia, dental problems, itching, sneezing,  nasal congestion or discharge of excess mucus or purulent secretions, ear ache,   fever, chills, sweats, unintended wt loss or wt gain, classically pleuritic or exertional cp,  orthopnea pnd or arm/hand swelling  or leg swelling, presyncope, palpitations, abdominal pain, anorexia, nausea, vomiting, diarrhea  or change in bowel habits or change in bladder habits, change in stools or change in urine, dysuria, hematuria,  rash, arthralgias, visual complaints, headache, numbness, weakness or ataxia or problems with walking or coordination,  change in mood or  memory.        Current Meds  Medication Sig   albuterol (VENTOLIN HFA) 108 (90 Base) MCG/ACT inhaler Inhale 2 puffs into the lungs every 6 (six) hours as needed for wheezing or shortness of breath.   amLODipine (NORVASC) 10 MG tablet TAKE 1 TABLET(10 MG) BY MOUTH DAILY   bictegravir-emtricitabine-tenofovir AF (BIKTARVY) 50-200-25 MG TABS tablet TAKE 1 TABLET BY MOUTH DAILY.   BIKTARVY 50-200-25 MG TABS tablet TAKE 1 TABLET BY MOUTH DAILY.   budesonide-formoterol (SYMBICORT) 80-4.5 MCG/ACT inhaler TAKE  2 PUFFS FIRST THING IN THE MORNING AND THEN ANOTHER 2 PUFFS 12 HOURS LATER   budesonide-formoterol (SYMBICORT) 80-4.5 MCG/ACT inhaler Inhale 2 puffs into the lungs 2 (two) times daily.   Ensure (ENSURE) Take 237 mLs by mouth 3 (three) times daily between meals.   fluticasone (  FLONASE) 50 MCG/ACT nasal spray Place 1 spray into both nostrils daily.   levocetirizine (XYZAL) 5 MG tablet Take 5 mg by mouth every evening.   megestrol (MEGACE) 40 MG/ML suspension Take 10 mLs (400 mg total) by mouth daily.   omeprazole (PRILOSEC) 20 MG capsule Take 1 capsule (20 mg total) by mouth daily.   pravastatin (PRAVACHOL) 40 MG tablet Take 1 tablet (40 mg total) by mouth daily.   PRESCRIPTION MEDICATION Inject 1 Dose into the skin once a week. Allergy shot in office on Tuesdays   traMADol (ULTRAM) 50 MG tablet Take 1 tablet (50 mg total) by mouth every 12 (twelve) hours as needed.           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    NECK :  without JVD/Nodes/TM/ nl carotid upstrokes bilaterally   LUNGS: no acc muscle use,  Mod barrel  contour chest wall with bilateral  Distant bs s audible wheeze and  without cough on insp or exp maneuvers and mod  Hyperresonant  to  percussion bilaterally     CV:   RRR  no s3 or murmur or increase in P2, and no edema   ABD:  soft and nontender with pos mid insp Hoover's  in the supine position. No bruits or organomegaly appreciated, bowel sounds nl  MS:     ext warm without deformities, calf tenderness, cyanosis or clubbing No obvious joint restrictions   SKIN: warm and dry without lesions    NEURO:  alert, approp, nl sensorium with  no motor or cerebellar deficits apparent.            Assessment

## 2021-09-14 NOTE — Assessment & Plan Note (Signed)
Quit smoking 2014  -08/11/2019  After extensive coaching inhaler device,  effectiveness =    50% from baseline 25% (short Ti) > continue symb 80 2bid  - 06/09/2020  After extensive coaching inhaler device,  effectiveness =    75% > try breztri 2bid if insurance covers> no change on breztri so stayed with symbicort 80  - flutter valve added 04/21/21 - PFT's  09/14/2021  FEV1 0.74  (32 % ) ratio 0.41  p 0 % improvement from saba p symb 80 prior to study with DLCO  11.08 (50%) corrects to 3.44 (82%)  for alv volume and FV curve classic concavity   GOLD 3 with AB pattern in pt with HIV so trying to keep the ICS dose low  - could add lama but she already tried that with breztri and no better - if getting worse ex tol could try again in the form of spiriva 2.5 but she's happy with present level of activity tol so no change rx for now   >>> f/u q 12 m, sooner if needed         Each maintenance medication was reviewed in detail including emphasizing most importantly the difference between maintenance and prns and under what circumstances the prns are to be triggered using an action plan format where appropriate.  Total time for H and P, chart review, counseling, reviewing hfa device(s) and generating customized AVS unique to this office visit / same day charting = 31 min

## 2021-09-14 NOTE — Patient Instructions (Signed)
No change in medications   Please schedule a follow up visit in 12  months but call sooner if needed  

## 2021-09-14 NOTE — Progress Notes (Signed)
PFT done today. 

## 2021-09-17 DIAGNOSIS — Z419 Encounter for procedure for purposes other than remedying health state, unspecified: Secondary | ICD-10-CM | POA: Diagnosis not present

## 2021-09-19 DIAGNOSIS — Z7689 Persons encountering health services in other specified circumstances: Secondary | ICD-10-CM | POA: Diagnosis not present

## 2021-09-19 DIAGNOSIS — M1990 Unspecified osteoarthritis, unspecified site: Secondary | ICD-10-CM | POA: Diagnosis not present

## 2021-09-20 ENCOUNTER — Other Ambulatory Visit: Payer: Self-pay

## 2021-09-20 ENCOUNTER — Other Ambulatory Visit: Payer: Self-pay | Admitting: *Deleted

## 2021-09-20 NOTE — Patient Outreach (Signed)
Medicaid Managed Care   Nurse Care Manager Note  09/20/2021 Name:  Janice Williams MRN:  [id number] DOB:  Mar 05, 1961  Janice Williams is an 60 y.o. year old female who is a primary patient of Janice Rob, MD.  The [facility] Managed Care Coordination team was consulted for assistance with:    COPD  Janice Williams was given information about Medicaid Managed Care Coordination team services today. Janice Williams Patient agreed to services and verbal consent obtained.  Engaged with patient by telephone for follow up visit in response to provider referral for case management and/or care coordination services.   Assessments/Interventions:  Review of past medical history, allergies, medications, health status, including review of consultants reports, laboratory and other test data, was performed as part of comprehensive evaluation and provision of chronic care management services.  SDOH (Social Determinants of Health) assessments and interventions performed:   Care Plan  Allergies  Allergen Reactions   Chantix [Varenicline] Itching    Medications Reviewed Today    RNCM unable to review each medication with patient. She was not at home and dealing with the passing of her brother.      Patient Active Problem List   Diagnosis Date Noted   Need for shingles vaccine 08/14/2021   Constipation 05/12/2021   Cough 03/30/2021   Screening for cervical cancer 03/18/2021   Ear pain, bilateral 03/17/2021   Degenerative disc disease, cervical 10/13/2020   Chronic, continuous use of opioids 09/10/2020   Rhinitis, chronic 06/10/2020   Restless legs syndrome (RLS) 01/08/2020   Osteoporosis 10/10/2019   COPD  GOLD 3  07/08/2019   Moderate protein-calorie malnutrition ([place]) 05/31/2018   Hypertension 06/08/2017   Left knee pain 03/15/2015   Healthcare maintenance 07/06/2011   Hyperlipidemia 06/03/2009   WEIGHT LOSS 06/03/2009   Human immunodeficiency virus (HIV) disease ([place]) 10/29/2008   GERD  10/29/2008   CEREBROVASCULAR ACCIDENT, HX OF 10/29/2008   TOBACCO DEPENDENCE 02/14/2007   Allergic rhinitis 02/14/2007   Symptoms concerning nutrition, metabolism, and development 02/14/2007    Conditions to be addressed/monitored per PCP order:  COPD  Care Plan : COPD (Adult)  Updates made by Melissa Montane, RN since 09/20/2021 12:00 AM     Problem: Disease Progression (COPD)      Long-Range Goal: Disease Progression Minimized or Managed   Start Date: 01/03/2021  Expected End Date: 10/20/2021  Recent Progress: On track  Priority: Medium  Note:   Current Barriers:  Chronic Disease Management support and education needs related to COPD-Patient feels that she is managing her health at home very well. She reports needing a raised toilet for ease and mobility. 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 Janice Williams has a [employer] aide 7 days a week for a couple of hours a day to assistance with bathing, light house duties and shopping. Janice. Williams received the requested bedside commode. Janice. Williams is dealing with the recent loss of her father. She has strong support from her church. She is dealing with constipation and does not get relief from prescribed medications.-Update-Janice Williams was upset this morning. She explained to [facility] that her brother had passed. She denies any needs at this time. She has all of her medications and is taking as prescribed.  Nurse Case Manager Clinical Goal(s):  patient will verbalize understanding of plan for managing COPD patient will attend all scheduled medical appointments the patient will demonstrate ongoing self health care management ability as evidenced  by taking medications as prescribed, notifying provider with any concerns or changes in symptoms, work on breathing exercises daily Interventions:  Inter-disciplinary care team collaboration (see longitudinal plan of care) Evaluation of current treatment plan related to COPD  and patient's adherence to plan as established by provider. Discussed plans with patient for ongoing care management follow up and provided patient with direct contact information for care management team Provided patient the opportunity to meet with LCSW for grief counseling-Patient declined Provided therapeutic listening and comfort Reviewed upcoming appointment and discussed scheduling PCP follow up Patient Goals/Self-Care Activities - increase fiber in your diet - contact [employer] [phone] for monthly OTC benefits - attend all scheduled appointments - take all medication as prescribed - contact your provider with any questions, concerns or changes in symptoms - do breathing exercises every day - do exercises in a comfortable position that makes breathing as easy as possible  Follow Up Plan: Telephone follow up appointment with Managed Medicaid care management team member scheduled for:10/20/21 @ 10:30am         Follow Up:  Patient agrees to Care Plan and Follow-up.  Plan: The Managed Medicaid care management team will reach out to the patient again over the next 30 days.  Date/time of next scheduled RN care management/care coordination outreach:  10/20/21 @ 10:30am  Lurena Joiner RN, BSN [place] RN Care Coordinator

## 2021-09-20 NOTE — Patient Instructions (Signed)
Visit Information  Janice Williams was given information about Medicaid Managed Care team care coordination services as a part of their [facility] Medicaid benefit. Janice Williams verbally consented to engagement with the [facility] Managed Care team.   If you are experiencing a medical emergency, please call 911 or report to your local emergency department or urgent care.   If you have a non-emergency medical problem during routine business hours, please contact your provider's office and ask to speak with a nurse.   For questions related to your [facility] health plan, please call: [phone] or go here:[email or web address]  If you would like to schedule transportation through your [facility] plan, please call the following number at least 2 days in advance of your appointment: [phone].  Call the [employer] at [phone], at any time, 24 hours a day, 7 days a week. If you are in danger or need immediate medical attention call 911.  If you would like help to quit smoking, call 1-800-QUIT-NOW 260-258-9560) OR Espaol: 1-855-Djelo-Ya (3-220-254-2706) o para Janice informacin haga clic aqu or Text READY to 200-400 to register via text  Janice Williams - following are the goals we discussed in your visit today:   Goals Addressed             This Visit's Progress    Learn and Do Breathing Exercises-COPD       Timeframe:  Long-Range Goal Priority:  Medium Start Date:    01/03/21                         Expected End Date: 10/20/21       Follow up 10/20/21           - increase fiber in your diet - contact [employer] [phone] for monthly OTC benefits - attend all scheduled appointments - take all medication as prescribed - contact your provider with any questions, concerns or changes in symptoms - do breathing exercises every day - do exercises in a comfortable position that makes breathing as easy as possible    Why is this important?    Breathing exercises can help lessen the cough that comes with chronic obstructive pulmonary disease.  Doing the exercises will give you more energy.  They will also help you to control your symptoms.             Please see education materials related to wellness and managing loss provided by MyChart link.  The patient has access to MyChart and can view provided education  Telephone follow up appointment with Managed Medicaid care management team member scheduled for:10/20/21 @ 10:30am  Janice Williams, Janice Williams Janice Williams   Following is a copy of your plan of care:  Patient Care Plan: COPD (Adult)     Problem Identified: Disease Progression (COPD)      Long-Range Goal: Disease Progression Minimized or Managed   Start Date: 01/03/2021  Expected End Date: 10/20/2021  Recent Progress: On track  Priority: Medium  Note:   Current Barriers:  Chronic Disease Management support and education needs related to COPD-Patient feels that she is managing her health at home very well. She reports needing a raised toilet for ease and mobility. 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 Janice Williams has a [employer] aide 7 days a week for a couple of hours a day to assistance with bathing, light house  duties and shopping. Janice Williams received the requested bedside commode. Janice Williams is dealing with the recent loss of her father. She has strong support from her church. She is dealing with constipation and does not get relief from prescribed medications.-Update-Janice Williams was upset this morning. She explained to [facility] that her brother had passed. She denies any needs at this time. She has all of her medications and is taking as prescribed.  Nurse Case Manager Clinical Goal(s):  patient will verbalize understanding of plan for managing COPD patient will attend all scheduled medical appointments the patient will demonstrate  ongoing self health care management ability as evidenced by taking medications as prescribed, notifying provider with any concerns or changes in symptoms, work on breathing exercises daily Interventions:  Inter-disciplinary care team collaboration (see longitudinal plan of care) Evaluation of current treatment plan related to COPD and patient's adherence to plan as established by provider. Discussed plans with patient for ongoing care management follow up and provided patient with direct contact information for care management team Provided patient the opportunity to meet with LCSW for grief counseling-Patient declined Provided therapeutic listening and comfort Reviewed upcoming appointment and discussed scheduling PCP follow up Patient Goals/Self-Care Activities - increase fiber in your diet - contact [employer] [phone] for monthly OTC benefits - attend all scheduled appointments - take all medication as prescribed - contact your provider with any questions, concerns or changes in symptoms - do breathing exercises every day - do exercises in a comfortable position that makes breathing as easy as possible  Follow Up Plan: Telephone follow up appointment with Managed Medicaid care management team member scheduled for:10/20/21 @ 10:30am

## 2021-09-23 DIAGNOSIS — J3089 Other allergic rhinitis: Secondary | ICD-10-CM | POA: Diagnosis not present

## 2021-09-23 DIAGNOSIS — J301 Allergic rhinitis due to pollen: Secondary | ICD-10-CM | POA: Diagnosis not present

## 2021-09-23 DIAGNOSIS — Z7689 Persons encountering health services in other specified circumstances: Secondary | ICD-10-CM | POA: Diagnosis not present

## 2021-09-26 ENCOUNTER — Encounter: Payer: Self-pay | Admitting: Family

## 2021-09-26 ENCOUNTER — Other Ambulatory Visit: Payer: Self-pay

## 2021-09-26 ENCOUNTER — Other Ambulatory Visit (HOSPITAL_COMMUNITY): Payer: Self-pay

## 2021-09-26 ENCOUNTER — Ambulatory Visit (INDEPENDENT_AMBULATORY_CARE_PROVIDER_SITE_OTHER): Payer: Medicaid Other | Admitting: Family

## 2021-09-26 ENCOUNTER — Telehealth: Payer: Self-pay

## 2021-09-26 VITALS — BP 123/85 | HR 87 | Temp 98.2°F | Wt 112.0 lb

## 2021-09-26 DIAGNOSIS — Z7689 Persons encountering health services in other specified circumstances: Secondary | ICD-10-CM | POA: Diagnosis not present

## 2021-09-26 DIAGNOSIS — K5903 Drug induced constipation: Secondary | ICD-10-CM

## 2021-09-26 DIAGNOSIS — Z23 Encounter for immunization: Secondary | ICD-10-CM

## 2021-09-26 DIAGNOSIS — M1990 Unspecified osteoarthritis, unspecified site: Secondary | ICD-10-CM | POA: Diagnosis not present

## 2021-09-26 DIAGNOSIS — B20 Human immunodeficiency virus [HIV] disease: Secondary | ICD-10-CM

## 2021-09-26 DIAGNOSIS — Z0000 Encounter for general adult medical examination without abnormal findings: Secondary | ICD-10-CM | POA: Diagnosis not present

## 2021-09-26 MED ORDER — BIKTARVY 50-200-25 MG PO TABS
1.0000 | ORAL_TABLET | Freq: Every day | ORAL | 4 refills | Status: DC
Start: 2021-09-26 — End: 2022-04-24
  Filled 2021-09-26 – 2021-11-30 (×2): qty 30, 30d supply, fill #0
  Filled 2021-12-27: qty 30, 30d supply, fill #1
  Filled 2022-02-01: qty 30, 30d supply, fill #2
  Filled 2022-03-02: qty 30, 30d supply, fill #3
  Filled 2022-03-30: qty 30, 30d supply, fill #4

## 2021-09-26 NOTE — Telephone Encounter (Signed)
We should discuss this further at next visit. Please have Ms.Frisby scheduled at next available clinic month with me.

## 2021-09-26 NOTE — Telephone Encounter (Signed)
Requesting to speak with a nurse about getting med for constipation. Please call back.

## 2021-09-26 NOTE — Assessment & Plan Note (Addendum)
Ms. Gertz continues to have symptoms of constipation refractory to MiraLAX and Senokot describing that she has to strain at times.  Discussed nutritional intake as a contributing factor and cannot rule out side effects of multiple medications including Tramadol.  May be a candidate for an alternative medication such as Linzess or possibly Movantik.  Advised to follow-up with primary care for further evaluation and treatment.

## 2021-09-26 NOTE — Assessment & Plan Note (Signed)
Janice Williams continues to have well-controlled virus with good adherence and tolerance to her ART regimen of Biktarvy.  No signs/symptoms of opportunistic infection.  We reviewed lab work and discussed plan of care.  Continue current dose of Biktarvy.  Plan for follow-up in 4 months or sooner if needed with lab work 1 to 2 weeks prior to appointment.

## 2021-09-26 NOTE — Telephone Encounter (Signed)
RTC, Patient states she has tried everything OTC for constipation and everything has failed.  She states she spoke w/ her ID physician and he recommended she speak to her PCP about Linzess.  Pt is c/o daily constipation and states she "is sitting in her BR for hours Q day". SChaplin, RN,BSN

## 2021-09-26 NOTE — Assessment & Plan Note (Signed)
   Discussed importance of safe sexual practice and condom usage.  Condoms offered and declined.  Influenza vaccine updated today.  Routine dental care is up-to-date per recommendations and awaiting dentures

## 2021-09-26 NOTE — Patient Instructions (Addendum)
Nice to see you.  We will check your lab work today.  Continue to take your medication daily as prescribed.  Refills have been sent to the pharmacy.  Plan for follow up in 4 months or sooner if needed with lab work 1-2 weeks prior to appointment.    Have a great day and stay safe!  

## 2021-09-26 NOTE — Progress Notes (Signed)
Patient ID: Janice Williams, female    DOB: 1961-02-18, 60 y.o.   MRN: [id number]  Subjective:    Chief Complaint  Patient presents with   Follow-up    HPI:  Janice Williams is a 60 y.o. female with HIV disease last seen on 03/21/2021 by Dr. Baxter Flattery with well controlled virus and good adherence to her ART regimen of Biktarvy. Viral load was undetectable and CD4 count 682. Most recent lab work completed on 08/01/21 with viral load that remains undetectable and CD4 count of 899. Kidney function, liver function and electrolytes within normal ranges. Lipid profile with LDL 131, HDL 75 and Triglycerides 63. Here today for routine follow up.   [name] continues to take her Biktarvy daily as prescribed with no adverse side effects.  She continues to have concerns about constipation that has been refractory to MiraLAX and Senokot. Denies fevers, chills, night sweats, headaches, changes in vision, neck pain/stiffness, nausea, diarrhea, vomiting, lesions or rashes.  [name] has no problems obtaining medication from pharmacy and remains covered by Medicaid.  Has been having some feelings of grief as her been several deaths in her family recently dating back to Jul 17, 2023 when her father passed away and she recently lost her brother last week.  She has good support around her.  No current recreational illicit drug use, tobacco use, or alcohol consumption.  Routine dental care is up-to-date with removal of her remaining teeth and is awaiting dentures.  Healthcare maintenance due includes influenza vaccine.  Condoms offered and declined.    Allergies  Allergen Reactions   Chantix [Varenicline] Itching      Outpatient Medications Prior to Visit  Medication Sig Dispense Refill   albuterol (VENTOLIN HFA) 108 (90 Base) MCG/ACT inhaler Inhale 2 puffs into the lungs every 6 (six) hours as needed for wheezing or shortness of breath.     amLODipine (NORVASC) 10 MG tablet TAKE 1 TABLET(10 MG) BY MOUTH DAILY 90  tablet 1   bictegravir-emtricitabine-tenofovir AF (BIKTARVY) 50-200-25 MG TABS tablet TAKE 1 TABLET BY MOUTH DAILY. 30 tablet 2   budesonide-formoterol (SYMBICORT) 80-4.5 MCG/ACT inhaler TAKE 2 PUFFS FIRST THING IN THE MORNING AND THEN ANOTHER 2 PUFFS 12 HOURS LATER 10.2 g 5   budesonide-formoterol (SYMBICORT) 80-4.5 MCG/ACT inhaler Inhale 2 puffs into the lungs 2 (two) times daily. 1 each 9   Ensure (ENSURE) Take 237 mLs by mouth 3 (three) times daily between meals. 237 mL 5   fluticasone (FLONASE) 50 MCG/ACT nasal spray Place 1 spray into both nostrils daily. 16 g 3   levocetirizine (XYZAL) 5 MG tablet Take 5 mg by mouth every evening.     megestrol (MEGACE) 40 MG/ML suspension Take 10 mLs (400 mg total) by mouth daily. 480 mL 1   omeprazole (PRILOSEC) 20 MG capsule Take 1 capsule (20 mg total) by mouth daily. 90 capsule 5   pravastatin (PRAVACHOL) 40 MG tablet Take 1 tablet (40 mg total) by mouth daily. 30 tablet 4   PRESCRIPTION MEDICATION Inject 1 Dose into the skin once a week. Allergy shot in office on Tuesdays     traMADol (ULTRAM) 50 MG tablet Take 1 tablet (50 mg total) by mouth every 12 (twelve) hours as needed. 60 tablet 2   BIKTARVY 50-200-25 MG TABS tablet TAKE 1 TABLET BY MOUTH DAILY. 30 tablet 4   doxycycline (VIBRAMYCIN) 100 MG capsule Take 1 capsule (100 mg total) by mouth 2 (two) times daily. (Patient not taking: No sig reported) 20  capsule 0   methylPREDNISolone (MEDROL DOSEPAK) 4 MG TBPK tablet As directed (Patient not taking: No sig reported) 21 tablet 0   montelukast (SINGULAIR) 10 MG tablet TAKE 1 TABLET(10 MG) BY MOUTH AT BEDTIME (Patient not taking: No sig reported) 90 tablet 3   polyethylene glycol (MIRALAX / GLYCOLAX) packet Take one capful in 8 oz of fluid of your choice twice a day until having soft stools, then back down to once a day.  If not having soft stools with twice a day, increased to three times a day (Patient not taking: No sig reported) 100 each 0    senna-docusate (SENOKOT-S) 8.6-50 MG tablet Take 1 tablet by mouth daily. May adjust to achieve 1 bowel movement daily (Patient not taking: No sig reported) 60 tablet 3   No facility-administered medications prior to visit.     Past Medical History:  Diagnosis Date   Allergy    Anemia    Arthritis    lower back, knees   Bronchitis    COPD (chronic obstructive pulmonary disease) (HCC)    COPD with acute exacerbation ([place]) 06/09/2017   Dental abscess 03/15/2015   DYSPEPSIA 02/14/2007   Qualifier: Diagnosis of  By: Samara Snide     EXTERNAL OTITIS 01/06/2010   Qualifier: Diagnosis of  By: Tomma Lightning MD, Claiborne Billings     FACIAL RASH 02/04/2009   Qualifier: Diagnosis of  By: Rhunette Croft     Former smoker    quit 2014   GERD (gastroesophageal reflux disease)    HIV infection ([place])    Hypertension    Stroke ([place])    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  Eyes:        Negative for acute change in vision  Respiratory:  Negative for chest tightness, shortness of breath and wheezing.   Cardiovascular:  Negative for chest pain.  Gastrointestinal:  Positive for constipation. Negative for diarrhea, nausea and vomiting.  Genitourinary:  Negative for dysuria, pelvic pain and vaginal discharge.  Musculoskeletal:  Negative for neck pain and neck stiffness.  Skin:  Negative for rash.  Neurological:  Negative for seizures, syncope, weakness and headaches.  Hematological:  Negative for adenopathy. Does not bruise/bleed easily.  Psychiatric/Behavioral:  Negative for hallucinations.      Objective:    BP 123/85   Pulse 87    Temp 98.2 F (36.8 C) (Oral)   Wt 112 lb (50.8 kg)   SpO2 100%   BMI 13.28 kg/m  Nursing note and vital signs reviewed.  Physical Exam Constitutional:      General: She is not in acute distress.    Appearance: She is well-developed.  Eyes:     Conjunctiva/sclera: Conjunctivae normal.  Cardiovascular:     Rate and Rhythm: Normal rate and regular rhythm.     Heart sounds: Normal heart sounds. No murmur heard.   No friction rub. No gallop.  Pulmonary:     Effort: Pulmonary effort is normal. No respiratory distress.     Breath sounds: Normal breath sounds. No  wheezing or rales.  Chest:     Chest wall: No tenderness.  Abdominal:     General: Bowel sounds are normal.     Palpations: Abdomen is soft.     Tenderness: There is no abdominal tenderness.  Musculoskeletal:     Cervical back: Neck supple.  Lymphadenopathy:     Cervical: No cervical adenopathy.  Skin:    General: Skin is warm and dry.     Findings: No rash.  Neurological:     Mental Status: She is alert and oriented to person, place, and time.  Psychiatric:        Behavior: Behavior normal.        Thought Content: Thought content normal.        Judgment: Judgment normal.     Depression screen [facility] 2/9 08/11/2021 05/12/2021 03/30/2021 03/23/2021 03/21/2021  Decreased Interest 0 0 0 0 0  Down, Depressed, Hopeless 0 0 0 0 0  PHQ - 2 Score 0 0 0 0 0  Altered sleeping 0 0 1 1 -  Tired, decreased energy 0 0 0 1 -  Change in appetite 0 0 0 0 -  Feeling bad or failure about yourself  0 0 0 0 -  Trouble concentrating 0 0 0 0 -  Moving slowly or fidgety/restless 0 0 0 0 -  Suicidal thoughts 0 0 0 0 -  PHQ-9 Score 0 0 1 2 -  Difficult doing work/chores Not difficult at all Not difficult at all Not difficult at all Not difficult at all -  Some recent data might be hidden       Assessment & Plan:    Patient Active Problem List   Diagnosis Date Noted   Need for shingles vaccine 08/14/2021   Constipation 05/12/2021    Cough 03/30/2021   Screening for cervical cancer 03/18/2021   Ear pain, bilateral 03/17/2021   Degenerative disc disease, cervical 10/13/2020   Chronic, continuous use of opioids 09/10/2020   Rhinitis, chronic 06/10/2020   Restless legs syndrome (RLS) 01/08/2020   Osteoporosis 10/10/2019   COPD  GOLD 3  07/08/2019   Moderate protein-calorie malnutrition ([place]) 05/31/2018   Hypertension 06/08/2017   Left knee pain 03/15/2015   Healthcare maintenance 07/06/2011   Hyperlipidemia 06/03/2009   WEIGHT LOSS 06/03/2009   Human immunodeficiency virus (HIV) disease ([place]) 10/29/2008   GERD 10/29/2008   CEREBROVASCULAR ACCIDENT, HX OF 10/29/2008   TOBACCO DEPENDENCE 02/14/2007   Allergic rhinitis 02/14/2007   Symptoms concerning nutrition, metabolism, and development 02/14/2007     Problem List Items Addressed This Visit       Other   Human immunodeficiency virus (HIV) disease ([place])    Ms. Mcmeans continues to have well-controlled virus with good adherence and tolerance to her ART regimen of Biktarvy.  No signs/symptoms of opportunistic infection.  We reviewed lab work and discussed plan of care.  Continue current dose of Biktarvy.  Plan for follow-up in 4 months or sooner if needed with lab work 1 to 2 weeks prior to appointment.      Relevant Medications   bictegravir-emtricitabine-tenofovir AF (BIKTARVY) 50-200-25 MG TABS tablet   Other Relevant Orders   HIV-1 RNA quant-no reflex-bld   T-helper cell (CD4)- ([facility] clinic only)   Comprehensive metabolic panel   CBC   Healthcare maintenance    Discussed importance of safe sexual practice and condom usage.  Condoms offered and declined. Influenza vaccine updated today. Routine dental care is up-to-date per recommendations and awaiting dentures  Constipation    Ms. Flight continues to have symptoms of constipation refractory to MiraLAX and Senokot describing that she has to strain at times.  Discussed nutritional intake as a  contributing factor and cannot rule out side effects of multiple medications including Tramadol.  May be a candidate for an alternative medication such as Linzess or possibly Movantik.  Advised to follow-up with primary care for further evaluation and treatment.      Other Visit Diagnoses     Need for immunization against influenza    -  Primary   Relevant Orders   Flu Vaccine QUAD 35mo+IM (Fluarix, Fluzone & Alfiuria Quad PF) (Completed)        I have changed Joselynn D. Warehime's Biktarvy. I am also having her maintain her polyethylene glycol, albuterol, levocetirizine, PRESCRIPTION MEDICATION, omeprazole, montelukast, budesonide-formoterol, Ensure, senna-docusate, budesonide-formoterol, methylPREDNISolone, doxycycline, megestrol, pravastatin, fluticasone, traMADol, Biktarvy, and amLODipine.   Meds ordered this encounter  Medications   bictegravir-emtricitabine-tenofovir AF (BIKTARVY) 50-200-25 MG TABS tablet    Sig: Take 1 tablet by mouth daily.    Dispense:  30 tablet    Refill:  4    Order Specific Question:   Supervising Provider    Answer:   Carlyle Basques [[id number]]      Follow-up: Return in about 4 months (around 01/27/2022), or if symptoms worsen or fail to improve.   Terri Piedra, MSN, FNP-C Nurse Practitioner [facility] [place] number: [phone]

## 2021-09-27 DIAGNOSIS — M1990 Unspecified osteoarthritis, unspecified site: Secondary | ICD-10-CM | POA: Diagnosis not present

## 2021-09-27 NOTE — Telephone Encounter (Signed)
TC to patient, notified of Dr. Lonzo Candy instructions and she verbalized understanding.  States she will call office next week to schedule with Dr. Court Joy.   SChaplin, RN,BSN

## 2021-09-30 DIAGNOSIS — J3089 Other allergic rhinitis: Secondary | ICD-10-CM | POA: Diagnosis not present

## 2021-09-30 DIAGNOSIS — J301 Allergic rhinitis due to pollen: Secondary | ICD-10-CM | POA: Diagnosis not present

## 2021-09-30 DIAGNOSIS — Z7689 Persons encountering health services in other specified circumstances: Secondary | ICD-10-CM | POA: Diagnosis not present

## 2021-10-03 DIAGNOSIS — M1990 Unspecified osteoarthritis, unspecified site: Secondary | ICD-10-CM | POA: Diagnosis not present

## 2021-10-04 ENCOUNTER — Other Ambulatory Visit (HOSPITAL_COMMUNITY): Payer: Self-pay

## 2021-10-07 DIAGNOSIS — J301 Allergic rhinitis due to pollen: Secondary | ICD-10-CM | POA: Diagnosis not present

## 2021-10-07 DIAGNOSIS — J3089 Other allergic rhinitis: Secondary | ICD-10-CM | POA: Diagnosis not present

## 2021-10-07 DIAGNOSIS — Z7689 Persons encountering health services in other specified circumstances: Secondary | ICD-10-CM | POA: Diagnosis not present

## 2021-10-10 ENCOUNTER — Other Ambulatory Visit (HOSPITAL_COMMUNITY): Payer: Self-pay

## 2021-10-11 DIAGNOSIS — J301 Allergic rhinitis due to pollen: Secondary | ICD-10-CM | POA: Diagnosis not present

## 2021-10-11 DIAGNOSIS — J3089 Other allergic rhinitis: Secondary | ICD-10-CM | POA: Diagnosis not present

## 2021-10-11 DIAGNOSIS — Z7689 Persons encountering health services in other specified circumstances: Secondary | ICD-10-CM | POA: Diagnosis not present

## 2021-10-14 ENCOUNTER — Ambulatory Visit (INDEPENDENT_AMBULATORY_CARE_PROVIDER_SITE_OTHER): Payer: Medicaid Other | Admitting: Internal Medicine

## 2021-10-14 ENCOUNTER — Encounter: Payer: Self-pay | Admitting: Internal Medicine

## 2021-10-14 ENCOUNTER — Other Ambulatory Visit: Payer: Self-pay

## 2021-10-14 VITALS — BP 122/91 | HR 94 | Temp 98.2°F | Resp 20 | Ht 65.0 in | Wt 113.5 lb

## 2021-10-14 DIAGNOSIS — K5900 Constipation, unspecified: Secondary | ICD-10-CM | POA: Diagnosis not present

## 2021-10-14 DIAGNOSIS — K5903 Drug induced constipation: Secondary | ICD-10-CM

## 2021-10-14 DIAGNOSIS — R634 Abnormal weight loss: Secondary | ICD-10-CM

## 2021-10-14 DIAGNOSIS — Z7689 Persons encountering health services in other specified circumstances: Secondary | ICD-10-CM | POA: Diagnosis not present

## 2021-10-14 MED ORDER — SORBITOL 70 % PO SOLN: 15.0000 mL | Freq: Every day | ORAL | 1 refills | Status: DC | PRN

## 2021-10-14 NOTE — Progress Notes (Signed)
   CC: weight loss and constipation   HPI:[name] is a 60 y.o. female who presents for evaluation of weight loss and constipation. Please see individual problem based A/P for details.   Past Medical History:  Diagnosis Date   Allergy    Anemia    Arthritis    lower back, knees   Bronchitis    COPD (chronic obstructive pulmonary disease) (HCC)    COPD with acute exacerbation ([place]) 06/09/2017   Dental abscess 03/15/2015   DYSPEPSIA 02/14/2007   Qualifier: Diagnosis of  By: Samara Snide     EXTERNAL OTITIS 01/06/2010   Qualifier: Diagnosis of  By: Tomma Lightning MD, Claiborne Billings     FACIAL RASH 02/04/2009   Qualifier: Diagnosis of  By: Rhunette Croft     Former smoker    quit 2014   GERD (gastroesophageal reflux disease)    HIV infection ([place])    Hypertension    Stroke ([place])    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  Gastrointestinal:  Positive for constipation. Negative for abdominal pain, blood in stool, diarrhea and melena.    Physical Exam: Vitals:   10/14/21 0953  BP: (!) 122/91  Pulse: 94  Resp: 20  Temp: 98.2 F (36.8 C)  TempSrc: Oral  SpO2: 100%  Weight: 113 lb 8 oz (51.5 kg)  Height: 5\' 5"  (1.651 m)   General: NAD, recently lost her brother. In good spirits today.  HEENT: Conjunctiva nl , antiicteric sclerae, dry mucous membranes, no exudate or erythema Cardiovascular: Normal rate, regular rhythm.  No murmurs, rubs, or gallops Pulmonary : Equal breath sounds, No wheezes, rales, or rhonchi Abdominal: soft, nontender,  bowel sounds present Ext: No edema in lower extremities, no tenderness to palpation of lower extremities.   Assessment & Plan:   See Encounters Tab for problem based charting.  Patient discussed with Dr. Angelia Mould

## 2021-10-14 NOTE — Patient Instructions (Signed)
Thank you for trusting me with your care. To recap, today we discussed the following:   Constipation - Continue to take senna docusate 1-2 daily - Use this new medication as needed. Start by taking every other day. Do not use daily for more than 7 days.  - Increase intake of fluids. I recommend at least 2 liters of water daily.

## 2021-10-16 ENCOUNTER — Encounter: Payer: Self-pay | Admitting: Internal Medicine

## 2021-10-16 NOTE — Assessment & Plan Note (Signed)
Wt Readings from Last 3 Encounters:  10/14/21 113 lb 8 oz (51.5 kg)  09/26/21 112 lb (50.8 kg)  09/14/21 109 lb 12.8 oz (49.8 kg)   Patients weight has increased since increasing megace. She reports feeling stronger since gaining weight. No blood clots  - Continue Megace

## 2021-10-16 NOTE — Assessment & Plan Note (Addendum)
Patient continues to have hard stools. Has 2 bowel movements a day on average. She could not remember when it first started, but estimates for years. Discussed Tramadol and by description she bowels are moving. Had relieve with  Miralax in the past, but started have stomach pain with medication.   Discussed taking sorbitol as need, can start every other day. Not to take every day. If not improving asked patient to follow up for consideration of other medications. Discussed staying hydrated and patient reports getting enough fiber in her diet. - sorbitol 70 % solution; Take 15 mLs by mouth daily as needed.  Dispense: 473 mL; Refill: 1

## 2021-10-18 DIAGNOSIS — Z419 Encounter for procedure for purposes other than remedying health state, unspecified: Secondary | ICD-10-CM | POA: Diagnosis not present

## 2021-10-20 ENCOUNTER — Other Ambulatory Visit: Payer: Self-pay | Admitting: *Deleted

## 2021-10-20 NOTE — Patient Instructions (Signed)
Visit Information  [name]  - as a part of your Medicaid benefit, you are eligible for care management and care coordination services at no cost or copay. I was unable to reach you by phone today but would be happy to help you with your health related needs. Please feel free to call me @ [phone].   A member of the Managed Medicaid care management team will reach out to you again over the next 14 days.   Lurena Joiner RN, BSN [place]  [employer] manager

## 2021-10-20 NOTE — Patient Outreach (Signed)
Care Coordination  10/20/2021  [name] 04-15-1961 [id number]   Medicaid Managed Care   Unsuccessful Outreach Note  10/20/2021 Name: Janice Williams MRN: [id number] DOB: 1961/07/20  Referred by: Madalyn Rob, MD Reason for referral : High Risk Managed Medicaid (Unsuccessful RNCM follow up outreach)   An unsuccessful telephone outreach was attempted today. The patient was referred to the case management team for assistance with care management and care coordination.   Follow Up Plan: A HIPAA compliant phone message was left for the patient providing contact information and requesting a return call.   Lurena Joiner RN, BSN [place]  [employer] manager

## 2021-10-21 DIAGNOSIS — J3089 Other allergic rhinitis: Secondary | ICD-10-CM | POA: Diagnosis not present

## 2021-10-21 DIAGNOSIS — J301 Allergic rhinitis due to pollen: Secondary | ICD-10-CM | POA: Diagnosis not present

## 2021-10-21 DIAGNOSIS — Z7689 Persons encountering health services in other specified circumstances: Secondary | ICD-10-CM | POA: Diagnosis not present

## 2021-10-26 NOTE — Progress Notes (Signed)
Internal Medicine Clinic Attending  Case discussed with Dr. Steen  At the time of the visit.  We reviewed the resident's history and exam and pertinent patient test results.  I agree with the assessment, diagnosis, and plan of care documented in the resident's note.  

## 2021-10-28 ENCOUNTER — Other Ambulatory Visit: Payer: Self-pay

## 2021-10-28 DIAGNOSIS — Z7689 Persons encountering health services in other specified circumstances: Secondary | ICD-10-CM | POA: Diagnosis not present

## 2021-10-28 DIAGNOSIS — R634 Abnormal weight loss: Secondary | ICD-10-CM

## 2021-10-28 DIAGNOSIS — J301 Allergic rhinitis due to pollen: Secondary | ICD-10-CM | POA: Diagnosis not present

## 2021-10-28 DIAGNOSIS — J3089 Other allergic rhinitis: Secondary | ICD-10-CM | POA: Diagnosis not present

## 2021-10-28 MED ORDER — ENSURE PO LIQD: 237.0000 mL | Freq: Three times a day (TID) | ORAL | 5 refills | Status: DC

## 2021-10-31 ENCOUNTER — Other Ambulatory Visit (HOSPITAL_COMMUNITY): Payer: Self-pay

## 2021-11-01 ENCOUNTER — Other Ambulatory Visit: Payer: Self-pay

## 2021-11-01 DIAGNOSIS — R634 Abnormal weight loss: Secondary | ICD-10-CM

## 2021-11-01 MED ORDER — MEGESTROL ACETATE 40 MG/ML PO SUSP: 400.0000 mg | Freq: Every day | ORAL | 2 refills | Status: DC

## 2021-11-01 NOTE — Telephone Encounter (Signed)
megestrol (MEGACE) 40 MG/ML suspension, refill request @ [facility] [place].

## 2021-11-03 ENCOUNTER — Other Ambulatory Visit (HOSPITAL_COMMUNITY): Payer: Self-pay

## 2021-11-07 ENCOUNTER — Other Ambulatory Visit (HOSPITAL_COMMUNITY): Payer: Self-pay

## 2021-11-11 DIAGNOSIS — Z7689 Persons encountering health services in other specified circumstances: Secondary | ICD-10-CM | POA: Diagnosis not present

## 2021-11-17 DIAGNOSIS — Z419 Encounter for procedure for purposes other than remedying health state, unspecified: Secondary | ICD-10-CM | POA: Diagnosis not present

## 2021-11-18 DIAGNOSIS — J301 Allergic rhinitis due to pollen: Secondary | ICD-10-CM | POA: Diagnosis not present

## 2021-11-18 DIAGNOSIS — Z7689 Persons encountering health services in other specified circumstances: Secondary | ICD-10-CM | POA: Diagnosis not present

## 2021-11-18 DIAGNOSIS — J3089 Other allergic rhinitis: Secondary | ICD-10-CM | POA: Diagnosis not present

## 2021-11-24 ENCOUNTER — Telehealth: Payer: Self-pay | Admitting: Internal Medicine

## 2021-11-24 MED ORDER — METHYLPREDNISOLONE 8 MG PO TABS: ORAL_TABLET | ORAL | 0 refills | Status: AC

## 2021-11-24 MED ORDER — CEFDINIR 300 MG PO CAPS
300.0000 mg | ORAL_CAPSULE | Freq: Two times a day (BID) | ORAL | 0 refills | Status: DC
Start: 2021-11-24 — End: 2022-04-06

## 2021-11-24 NOTE — Telephone Encounter (Signed)
Omnicef 300 mg bid x 6 days  Prednisone 10 mg take  4 each am x 2 days,   2 each am x 2 days,  1 each am x 2 days and stop

## 2021-11-24 NOTE — Telephone Encounter (Signed)
Medrol Rx has been sent to pharmacy for pt and pt is aware. Nothing further needed.

## 2021-11-24 NOTE — Telephone Encounter (Signed)
Called and spoke with pt letting her know the recs per [name] and when stated to her that we were going to send prednisone Rx to pharmacy for her and that it was a 10mg  tab pred taper, pt said that she is not able to tolerate the 10mg  tablet and needs to have medrol dosepak sent instead as the 10mg  made her shake a lot last time it was prescribed.  [name], please advise if we can send medrol dosepak instead for pt.

## 2021-11-24 NOTE — Telephone Encounter (Signed)
That's fine try   Medrol   8 mg x 4 x 2 day, x 2x 2 days and x one x 2 days and off  = 14 pills

## 2021-11-24 NOTE — Telephone Encounter (Signed)
Called and spoke with pt who states she has had cough with green phlegm and chest congestion along with wheezing which began 2 days ago.  Pt denies any complaints of fever, has not checked temp but does not feel like she has a fever.  Asked pt if she has tried any OTC meds to see if that would help and pt said she was too afraid to try anything OTC.  Pt said she is still using her Symbicort inhaler as prescribed. Pt said she has only had to use her albuterol inhaler once recently.  Due to pt's symptoms, she wants to know what can be recommended. [name], please advise.

## 2021-11-25 DIAGNOSIS — J301 Allergic rhinitis due to pollen: Secondary | ICD-10-CM | POA: Diagnosis not present

## 2021-11-25 DIAGNOSIS — J3089 Other allergic rhinitis: Secondary | ICD-10-CM | POA: Diagnosis not present

## 2021-11-30 ENCOUNTER — Other Ambulatory Visit (HOSPITAL_COMMUNITY): Payer: Self-pay

## 2021-12-06 ENCOUNTER — Other Ambulatory Visit (HOSPITAL_COMMUNITY): Payer: Self-pay

## 2021-12-08 ENCOUNTER — Emergency Department (HOSPITAL_COMMUNITY)
Admission: EM | Admit: 2021-12-08 | Discharge: 2021-12-08 | Disposition: A | Discharge status: Home / Self Care | Payer: Medicaid Other | Attending: Emergency Medicine | Admitting: Emergency Medicine

## 2021-12-08 ENCOUNTER — Encounter (HOSPITAL_COMMUNITY): Payer: Self-pay | Admitting: Emergency Medicine

## 2021-12-08 ENCOUNTER — Emergency Department (HOSPITAL_COMMUNITY): Payer: Medicaid Other

## 2021-12-08 ENCOUNTER — Encounter: Payer: Self-pay | Admitting: Emergency Medicine

## 2021-12-08 ENCOUNTER — Other Ambulatory Visit: Payer: Self-pay

## 2021-12-08 ENCOUNTER — Ambulatory Visit
Admission: EM | Admit: 2021-12-08 | Discharge: 2021-12-08 | Disposition: A | Discharge status: Home / Self Care | Payer: Medicaid Other

## 2021-12-08 DIAGNOSIS — B9789 Other viral agents as the cause of diseases classified elsewhere: Secondary | ICD-10-CM | POA: Diagnosis not present

## 2021-12-08 DIAGNOSIS — J069 Acute upper respiratory infection, unspecified: Secondary | ICD-10-CM

## 2021-12-08 DIAGNOSIS — Z79899 Other long term (current) drug therapy: Secondary | ICD-10-CM | POA: Insufficient documentation

## 2021-12-08 DIAGNOSIS — R053 Chronic cough: Secondary | ICD-10-CM

## 2021-12-08 DIAGNOSIS — J449 Chronic obstructive pulmonary disease, unspecified: Secondary | ICD-10-CM | POA: Diagnosis not present

## 2021-12-08 DIAGNOSIS — Z87891 Personal history of nicotine dependence: Secondary | ICD-10-CM | POA: Insufficient documentation

## 2021-12-08 DIAGNOSIS — Z20822 Contact with and (suspected) exposure to covid-19: Secondary | ICD-10-CM | POA: Diagnosis not present

## 2021-12-08 DIAGNOSIS — I10 Essential (primary) hypertension: Secondary | ICD-10-CM | POA: Insufficient documentation

## 2021-12-08 DIAGNOSIS — R059 Cough, unspecified: Secondary | ICD-10-CM | POA: Diagnosis not present

## 2021-12-08 DIAGNOSIS — R5383 Other fatigue: Secondary | ICD-10-CM

## 2021-12-08 LAB — CBC WITH DIFFERENTIAL/PLATELET
Abs Immature Granulocytes: 0.05 10*3/uL (ref 0.00–0.07)
Basophils Absolute: 0 10*3/uL (ref 0.0–0.1)
Basophils Relative: 0 %
Eosinophils Absolute: 0 10*3/uL (ref 0.0–0.5)
Eosinophils Relative: 0 %
HCT: 36.1 % (ref 36.0–46.0)
Hemoglobin: 12.2 g/dL (ref 12.0–15.0)
Immature Granulocytes: 1 %
Lymphocytes Relative: 30 %
Lymphs Abs: 2.4 10*3/uL (ref 0.7–4.0)
MCH: 34 pg (ref 26.0–34.0)
MCHC: 33.8 g/dL (ref 30.0–36.0)
MCV: 100.6 fL — ABNORMAL HIGH (ref 80.0–100.0)
Monocytes Absolute: 1 10*3/uL (ref 0.1–1.0)
Monocytes Relative: 12 %
Neutro Abs: 4.4 10*3/uL (ref 1.7–7.7)
Neutrophils Relative %: 57 %
Platelets: 291 10*3/uL (ref 150–400)
RBC: 3.59 MIL/uL — ABNORMAL LOW (ref 3.87–5.11)
RDW: 13.4 % (ref 11.5–15.5)
WBC: 7.8 10*3/uL (ref 4.0–10.5)
nRBC: 0 % (ref 0.0–0.2)

## 2021-12-08 LAB — BASIC METABOLIC PANEL
Anion gap: 9 (ref 5–15)
BUN: 13 mg/dL (ref 8–23)
CO2: 22 mmol/L (ref 22–32)
Calcium: 9.2 mg/dL (ref 8.9–10.3)
Chloride: 106 mmol/L (ref 98–111)
Creatinine, Ser: 1.03 mg/dL — ABNORMAL HIGH (ref 0.44–1.00)
GFR, Estimated: >60 mL/min (ref >60)
Glucose, Bld: 97 mg/dL (ref 70–99)
Potassium: 3.8 mmol/L (ref 3.5–5.1)
Sodium: 137 mmol/L (ref 135–145)

## 2021-12-08 LAB — RESP PANEL BY RT-PCR (FLU A&B, COVID) ARPGX2
Influenza A by PCR: NEGATIVE
Influenza B by PCR: NEGATIVE
SARS Coronavirus 2 by RT PCR: NEGATIVE

## 2021-12-08 IMAGING — CR DG CHEST 2V
2 series · 2 of 2 positions shown · non-contrast
Comparison: [DATE]

CLINICAL DATA: Cough and fatigue for several weeks, initial
encounter

EXAM:
CHEST - 2 VIEW

[w chest pa]
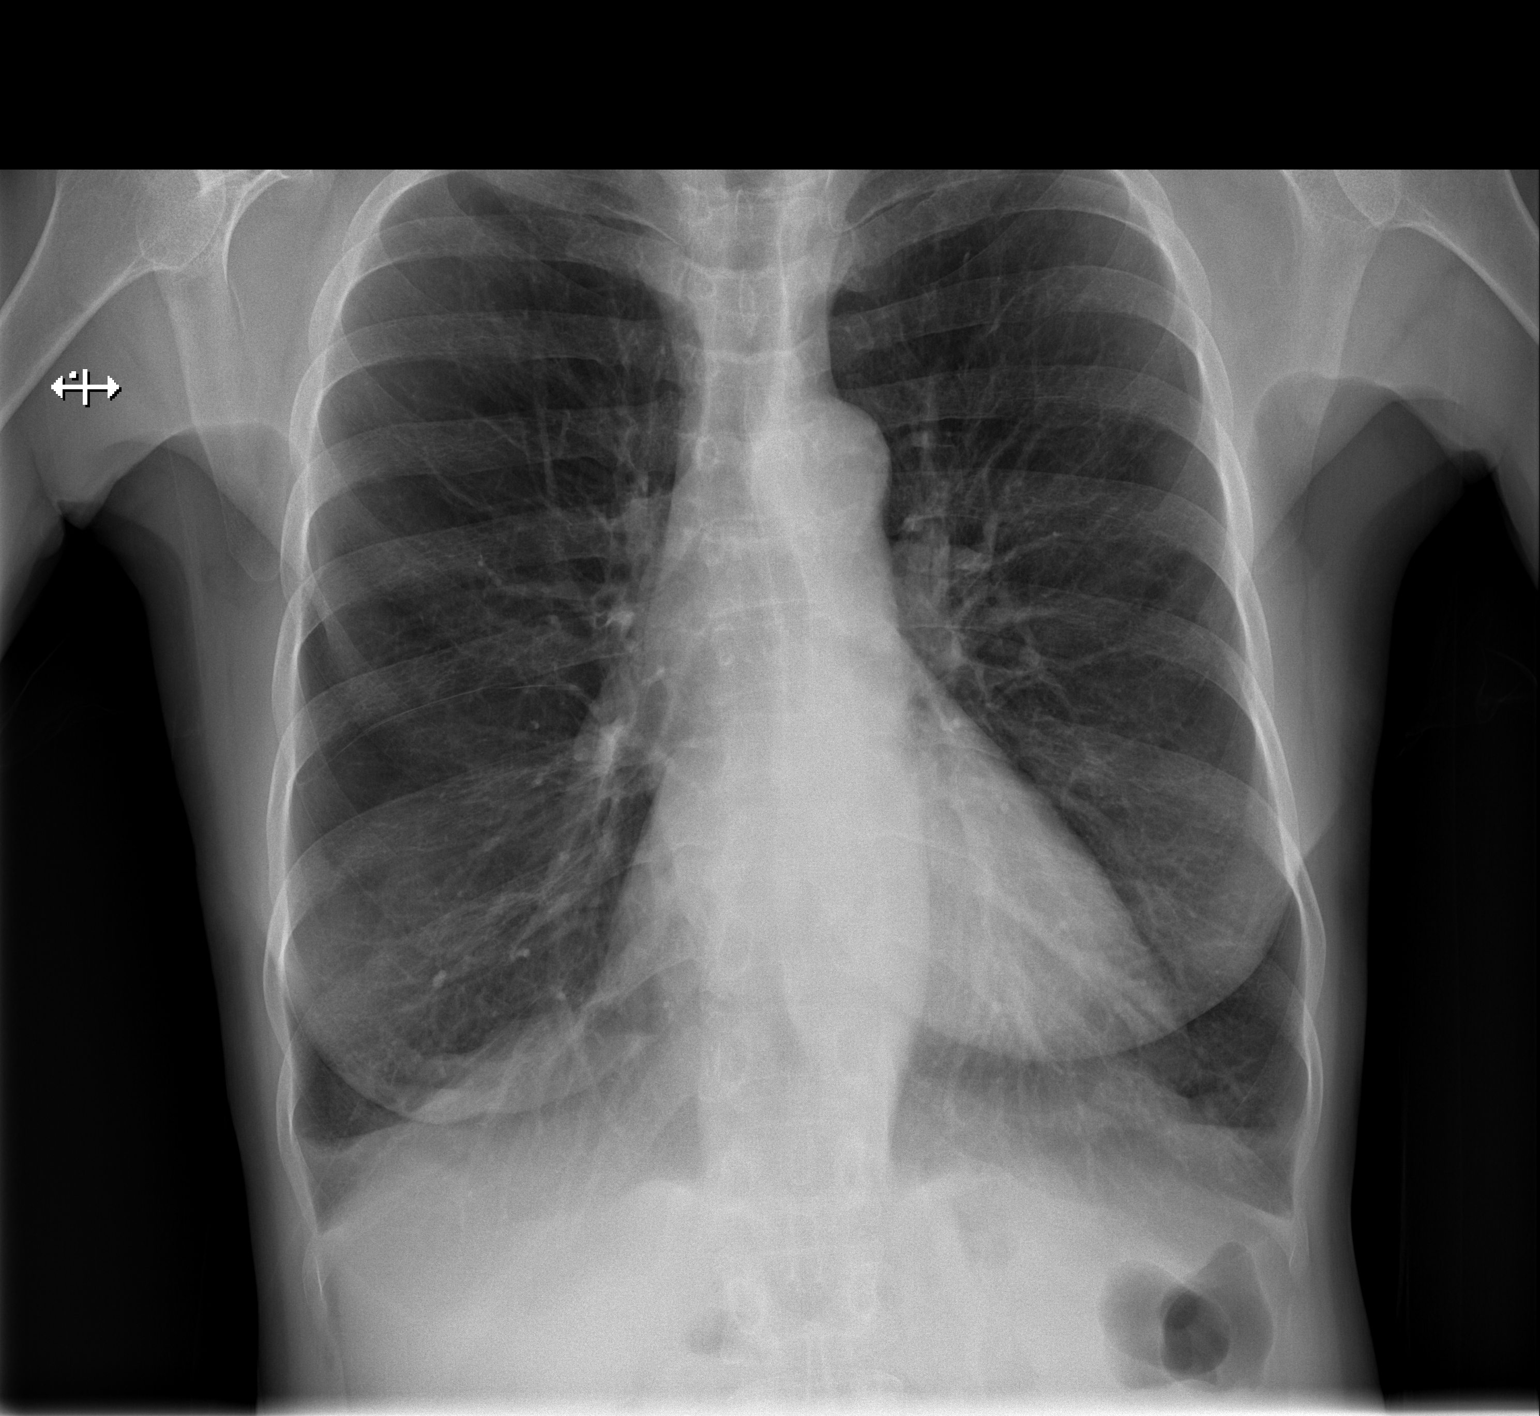

[w chest lat]
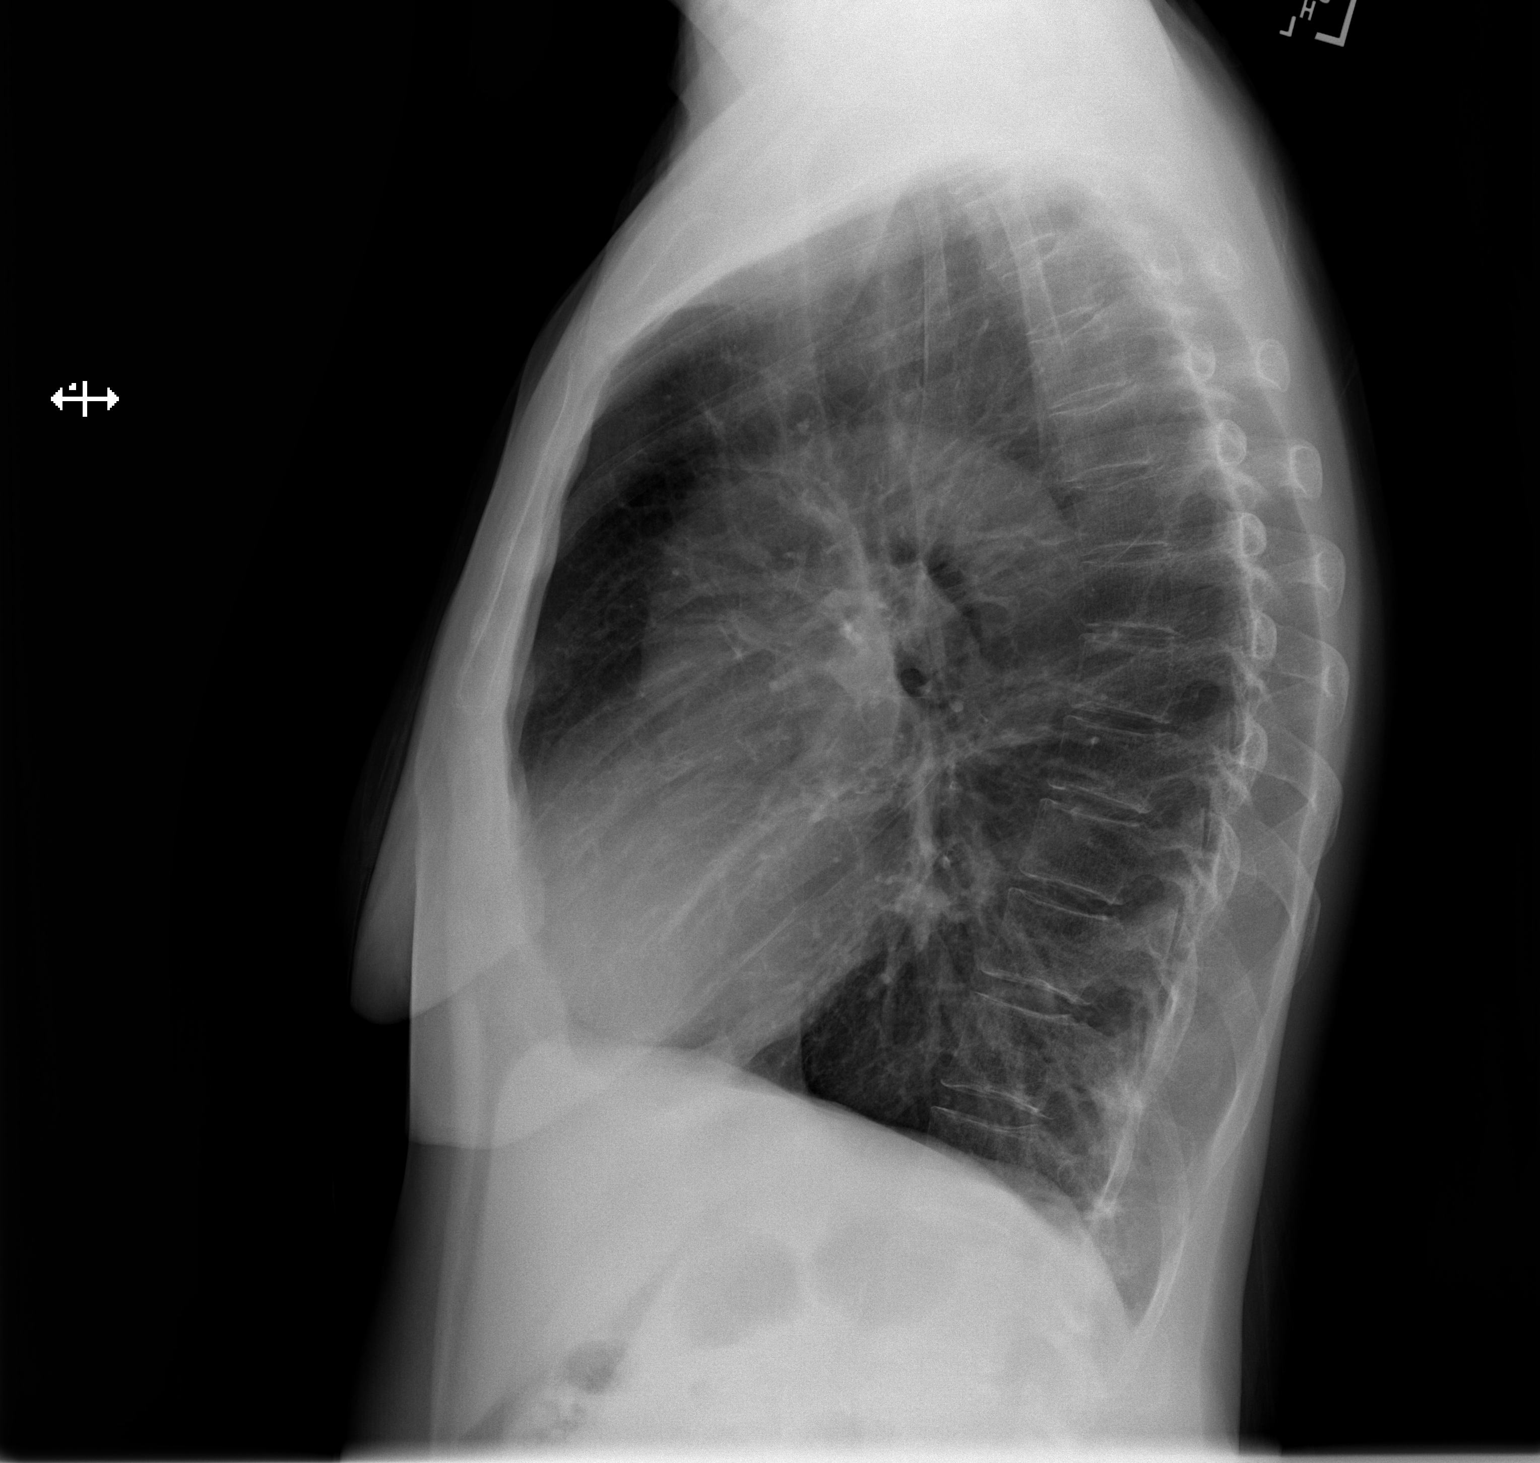

[2 of 2 positions shown; findings below may reference images not displayed]

FINDINGS: Cardiac shadow is within normal limits. Aortic calcifications are
seen. Lungs are hyperinflated consistent with COPD. No focal
infiltrate or effusion is noted. No bony abnormality is seen.
IMPRESSION: COPD without acute abnormality.

## 2021-12-08 MED ORDER — BENZONATATE 100 MG PO CAPS: 100.0000 mg | ORAL_CAPSULE | Freq: Three times a day (TID) | ORAL | 0 refills | Status: DC

## 2021-12-08 NOTE — ED Provider Notes (Signed)
Emergency Medicine Provider Triage Evaluation Note  Janice Williams , a 60 y.o. female  was evaluated in triage.  Pt complains of cough.  Patient states that for the past 2 weeks she has been feeling fatigued.  She notes that she has had a cough with green to yellow-tinged mucus.  She also feels that she has had off-and-on fevers that she has had sweating particularly at night.  She states that she was initially placed on cefdinir for possible COPD exacerbation.  She was then seen for an eye infection and switched from cefdinir to Keflex and prescribed a steroid eyedrop.  She states that the Keflex made her very shaky so she stopped taking it.  Dates that her symptoms or not improving so she presented to the emergency department.  She denies chest pain or shortness of breath.  Denies nausea, vomiting, diarrhea or abdominal pain.  She sees Dr. Graylon Good for her HIV and states that her last appointment was 3 months ago and her viral loads have been undetectable.  She states that she has been compliant on her antiretrovirals.    Review of Systems  Positive: See above Negative:   Physical Exam  BP (!) 158/112 (BP Location: Left Arm)    Pulse (!) 110    Temp 98 F (36.7 C) (Oral)    Resp 18    Ht 5\' 4"  (1.626 m)    Wt 51.7 kg    SpO2 98%    BMI 19.57 kg/m  Gen:   Awake, no distress   Resp:  Normal effort  MSK:   Moves extremities without difficulty  Other:  Oropharynx is clear without erythema or tonsillar swelling.  No sinus pain to palpation.  S1/S2 with tachycardia without murmur.  Lungs clear to auscultation bilaterally.  Medical Decision Making  Medically screening exam initiated at 6:19 PM.  Appropriate orders placed.  [name] was informed that the remainder of the evaluation will be completed by another provider, this initial triage assessment does not replace that evaluation, and the importance of remaining in the ED until their evaluation is complete.     Mickie Hillier, PA-C 12/08/21  1821    Fredia Sorrow, MD 12/21/21 1538

## 2021-12-08 NOTE — ED Notes (Signed)
Pt A&OX4 ambulatory at d/c with independent steady gait, NAD. Pt verbalized understanding of d/c instructions, prescription and follow up care. 

## 2021-12-08 NOTE — ED Provider Notes (Signed)
[facility] DEPT Provider Note   CSN: [id number] Arrival date & time: 12/08/21  1754     History Chief Complaint  Patient presents with   Cough    Janice Williams is a 60 y.o. female with history of COPD, GERD, hypertension, HIV who presents the emergency department complaining of cough for 2 weeks.  Patient states that she has had a productive cough with green-tinged mucus.  She is also complaining of fatigue, and an intermittent feeling of "heat in her body".  She states that this is worse at night, and she has had associated night sweats.  Patient was initially seen at her COPD specialist, who placed her on cefdinir for possible COPD exacerbation.  Patient then was seen at her eye doctor, and was put on Keflex for an eye infection as well as steroid eyedrop.  Patient did finish the cefdinir course at that time.  She states that after taking the Keflex, she felt very shaky, so she stopped taking it yesterday.  Patient states the feeling of fevers and chills started initially with the Keflex, but have persisted since stopping it yesterday.  Of note patient sees Dr. Graylon Good for her HIV treatment, and states that her last appointment was 3 months ago and her viral loads have been undetectable.  She has been compliant with her antiretrovirals.   Cough Associated symptoms: chills, diaphoresis and ear pain   Associated symptoms: no chest pain, no shortness of breath and no sore throat       Past Medical History:  Diagnosis Date   Allergy    Anemia    Arthritis    lower back, knees   Bronchitis    COPD (chronic obstructive pulmonary disease) (HCC)    COPD with acute exacerbation ([place]) 06/09/2017   Dental abscess 03/15/2015   DYSPEPSIA 02/14/2007   Qualifier: Diagnosis of  By: Samara Snide     EXTERNAL OTITIS 01/06/2010   Qualifier: Diagnosis of  By: Tomma Lightning MD, [place] 02/04/2009   Qualifier: Diagnosis of  By: Rhunette Croft     Former smoker     quit 2014   GERD (gastroesophageal reflux disease)    HIV infection ([place])    Hypertension    Stroke ([place])    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, clean 7 years   SVD (spontaneous vaginal delivery)    x 3    Patient Active Problem List   Diagnosis Date Noted   Need for shingles vaccine 08/14/2021   Constipation 05/12/2021   Cough 03/30/2021   Screening for cervical cancer 03/18/2021   Ear pain, bilateral 03/17/2021   Degenerative disc disease, cervical 10/13/2020   Chronic, continuous use of opioids 09/10/2020   Rhinitis, chronic 06/10/2020   Restless legs syndrome (RLS) 01/08/2020   Osteoporosis 10/10/2019   COPD  GOLD 3  07/08/2019   Moderate protein-calorie malnutrition ([place]) 05/31/2018   Hypertension 06/08/2017   Left knee pain 03/15/2015   Healthcare maintenance 07/06/2011   Hyperlipidemia 06/03/2009   WEIGHT LOSS 06/03/2009   Human immunodeficiency virus (HIV) disease ([place]) 10/29/2008   GERD 10/29/2008   CEREBROVASCULAR ACCIDENT, HX OF 10/29/2008   TOBACCO DEPENDENCE 02/14/2007   Allergic rhinitis 02/14/2007   Symptoms concerning nutrition, metabolism, and development 02/14/2007    Past Surgical History:  Procedure Laterality Date   MULTIPLE TOOTH EXTRACTIONS     with sedation   TUBAL LIGATION     UPPER GI ENDOSCOPY  normal per patient - "years ago"     OB History     Gravida  3   Para  3   Term  3   Preterm      AB      Living  3      SAB      IAB      Ectopic      Multiple      Live Births  3           Family History  Problem Relation Age of Onset   Hypertension Mother    Hypertension Father    Hyperlipidemia Father    COPD Father    Atrial fibrillation Father    Colon cancer Neg Hx    Rectal cancer Neg Hx    Stomach cancer Neg Hx     Social History   Tobacco Use   Smoking status: Former    Packs/day: 0.40    Years: 38.00    Pack years: 15.20    Types: Cigarettes    Start date: 12/18/2012    Quit date:  11/21/2013    Years since quitting: 8.0   Smokeless tobacco: Never  Vaping Use   Vaping Use: Never used  Substance Use Topics   Alcohol use: Not Currently   Drug use: No    Comment: Hx - clean since [place] Medications Prior to Admission medications   Medication Sig Start Date End Date Taking? Authorizing Provider  benzonatate (TESSALON) 100 MG capsule Take 1 capsule (100 mg total) by mouth every 8 (eight) hours. 12/08/21  Yes Minaal Struckman T, PA-C  albuterol (VENTOLIN HFA) 108 (90 Base) MCG/ACT inhaler Inhale 2 puffs into the lungs every 6 (six) hours as needed for wheezing or shortness of breath.    [provider]  amLODipine (NORVASC) 10 MG tablet TAKE 1 TABLET(10 MG) BY MOUTH DAILY 09/13/21   Madalyn Rob, MD  bictegravir-emtricitabine-tenofovir AF (BIKTARVY) 50-200-25 MG TABS tablet TAKE 1 TABLET BY MOUTH DAILY. 09/02/21   Carlyle Basques, MD  bictegravir-emtricitabine-tenofovir AF (BIKTARVY) 50-200-25 MG TABS tablet Take 1 tablet by mouth daily. 09/26/21   Golden Circle, FNP  budesonide-formoterol (SYMBICORT) 80-4.5 MCG/ACT inhaler TAKE 2 PUFFS FIRST THING IN THE MORNING AND THEN ANOTHER 2 PUFFS 12 HOURS LATER 03/14/21   Tanda Rockers, MD  budesonide-formoterol [facility]) 80-4.5 MCG/ACT inhaler Inhale 2 puffs into the lungs 2 (two) times daily. 07/13/21   Tanda Rockers, MD  cefdinir (OMNICEF) 300 MG capsule Take 1 capsule (300 mg total) by mouth 2 (two) times daily. 11/24/21   Tanda Rockers, MD  Ensure (ENSURE) Take 237 mLs by mouth 3 (three) times daily between meals. 10/28/21   Carlyle Basques, MD  fluticasone (FLONASE) 50 MCG/ACT nasal spray Place 1 spray into both nostrils daily. 08/11/21   Madalyn Rob, MD  levocetirizine (XYZAL) 5 MG tablet Take 5 mg by mouth every evening.    [provider]  megestrol (MEGACE) 40 MG/ML suspension Take 10 mLs (400 mg total) by mouth daily. 11/01/21   Madalyn Rob, MD  omeprazole (PRILOSEC) 20 MG capsule Take 1  capsule (20 mg total) by mouth daily. 11/16/20   Masoudi, Dorthula Rue, MD  pravastatin (PRAVACHOL) 40 MG tablet Take 1 tablet (40 mg total) by mouth daily. 08/11/21   Madalyn Rob, MD  PRESCRIPTION MEDICATION Inject 1 Dose into the skin once a week. Allergy shot in office on Tuesdays    [provider]  senna-docusate (SENOKOT-S) 8.6-50 MG tablet Take 1 tablet by mouth daily. May adjust to achieve 1 bowel movement daily Patient not taking: No sig reported 05/12/21   Andrew Au, MD  sorbitol 70 % solution Take 15 mLs by mouth daily as needed. 10/14/21   Madalyn Rob, MD  traMADol (ULTRAM) 50 MG tablet Take 1 tablet (50 mg total) by mouth every 12 (twelve) hours as needed. 08/11/21   Aldine Contes, MD    Allergies    Chantix [varenicline]  Review of Systems   Review of Systems  Constitutional:  Positive for chills and diaphoresis.  HENT:  Positive for congestion, ear pain and sinus pressure. Negative for sore throat and trouble swallowing.   Respiratory:  Positive for cough. Negative for shortness of breath.   Cardiovascular:  Negative for chest pain.  Gastrointestinal:  Negative for abdominal pain, constipation, diarrhea, nausea and vomiting.  All other systems reviewed and are negative.  Physical Exam Updated Vital Signs BP (!) 150/102 (BP Location: Left Arm)    Pulse 88    Temp 98 F (36.7 C) (Oral)    Resp 16    Ht 5\' 4"  (1.626 m)    Wt 51.7 kg    SpO2 100%    BMI 19.57 kg/m   Physical Exam Vitals and nursing note reviewed.  Constitutional:      Appearance: Normal appearance.  HENT:     Head: Normocephalic and atraumatic.     Right Ear: Tympanic membrane, ear canal and external ear normal.     Left Ear: Tympanic membrane, ear canal and external ear normal.     Nose: Congestion present.     Mouth/Throat:     Mouth: Mucous membranes are moist.     Pharynx: Oropharynx is clear. No oropharyngeal exudate or posterior oropharyngeal erythema.  Eyes:      Conjunctiva/sclera: Conjunctivae normal.  Neck:     Vascular: No JVD.  Cardiovascular:     Rate and Rhythm: Normal rate and regular rhythm.  Pulmonary:     Effort: Pulmonary effort is normal. No respiratory distress.     Breath sounds: Normal breath sounds.  Abdominal:     General: There is no distension.     Palpations: Abdomen is soft.     Tenderness: There is no abdominal tenderness.  Musculoskeletal:     Cervical back: Neck supple.  Skin:    General: Skin is warm and dry.  Neurological:     General: No focal deficit present.     Mental Status: She is alert.    ED Results / Procedures / Treatments   Labs (all labs ordered are listed, but only abnormal results are displayed) Labs Reviewed  BASIC METABOLIC PANEL - Abnormal; Notable for the following components:      Result Value   Creatinine, Ser 1.03 (*)    All other components within normal limits  CBC WITH DIFFERENTIAL/PLATELET - Abnormal; Notable for the following components:   RBC 3.59 (*)    MCV 100.6 (*)    All other components within normal limits  RESP PANEL BY RT-PCR (FLU A&B, COVID) [id number]    EKG None  Radiology DG Chest 2 View  Result Date: 12/08/2021 CLINICAL DATA:  Cough and fatigue for several weeks, initial encounter EXAM: CHEST - 2 VIEW COMPARISON:  04/21/2021 FINDINGS: Cardiac shadow is within normal limits. Aortic calcifications are seen. Lungs are hyperinflated consistent with COPD. No focal infiltrate or effusion is noted. No bony abnormality is seen. IMPRESSION: COPD  without acute abnormality. Electronically Signed   By: Inez Catalina M.D.   On: 12/08/2021 19:04    Procedures Procedures   Medications Ordered in ED Medications - No data to display  ED Course  I have reviewed the triage vital signs and the nursing notes.  Pertinent labs & imaging results that were available during my care of the patient were reviewed by me and considered in my medical decision making (see chart for  details).    MDM Rules/Calculators/A&P                         Patient is a 60 year old female with history of COPD and HIV who presents to the emergency department complaining of 2 weeks of cough, and about 5 days of fatigue and intermittent chills.  Patient initially presented to urgent care, and they did not have the capability to do a chest x-ray, and sent her to the emergency department for evaluation. Is compliant with antiretroviral treatment, most recent infectious disease appointment shows HIV viral load is undetectable.   On my exam patient is afebrile, not tachycardic, not hypoxic, and in no acute distress.  Lung sounds are clear to auscultation in all fields.  Oropharynx without erythema, exudate, or edema.  Bilateral ear exams normal.  Lab work overall unremarkable.  Flu and COVID testing negative.  Chest x-ray performed showed no acute cardiopulmonary abnormalities.  Discussed results with patient, and the likelihood that her symptoms are related to a viral infection, as these can take several weeks to resolve.  Since she is already been on antibiotics and steroids for likely COPD exacerbation/bronchitis, and she overall appears clinically well, I do not see a necessity to repeat these.  She has a previously prescribed albuterol inhaler that she uses as needed, she has not required this more frequently, which decreases my suspicion for repeat/persistent COPD exacerbation.  She is already been on antibiotics that would have covered her for acute bacterial sinusitis.  We will treat symptomatically for likely viral URI with cough.  She is not requiring mission or inpatient treatment for her symptoms at this time, given close return precautions.  Patient agreeable to plan.  I discussed this case with my attending physician Dr. Eulis Foster who cosigned this note including patient's presenting symptoms, physical exam, and planned diagnostics and interventions. Attending physician stated agreement with  plan or made changes to plan which were implemented.    Final Clinical Impression(s) / ED Diagnoses Final diagnoses:  Viral URI with cough    Rx / DC Orders ED Discharge Orders          Ordered    benzonatate (TESSALON) 100 MG capsule  Every 8 hours        12/08/21 2135           Portions of this report may have been transcribed using voice recognition software. Every effort was made to ensure accuracy; however, inadvertent computerized transcription errors may be present.    Estill Cotta 12/08/21 2137    Daleen Bo, MD 12/08/21 [phone]

## 2021-12-08 NOTE — ED Triage Notes (Signed)
Patient reports not feeling well over the past two weeks. Chills, cough and fatigue. Reports green tinged mucus with cough. Sent by UC for chest x-ray and further evaluation. Currently on keflex for eye infection.

## 2021-12-08 NOTE — ED Triage Notes (Signed)
Fatigued, weak, shaking over the last two weeks with a cough. Placed on keflex, made her feel worse, stopped taking it. Green productive mucus

## 2021-12-08 NOTE — Discharge Instructions (Addendum)
You were seen in the emergency department today for a cough.  As we discussed your lab work and your chest x-ray have all look good today.  You tested negative for COVID and flu.  I think your symptoms are likely related to viral respiratory infection, and this can take several weeks to resolve.  Would like you to continue taking Mucinex at home, but make sure that you are drinking lots of water with this.  I am also prescribing you a cough medicine, and you can take 1 capsule every 8 hours.  Continue to monitor how you're doing and return to the ER for new or worsening symptoms such as difficulty breathing or requiring your inhaler more frequently.   It has been a pleasure seeing and caring for you today and I hope you start feeling better soon!

## 2021-12-08 NOTE — Discharge Instructions (Signed)
Please go to emergency department as soon as you leave urgent care for further evaluation and management. 

## 2021-12-08 NOTE — ED Provider Notes (Signed)
[facility]    CSN: [id number] Arrival date & time: 12/08/21  1444      History   Chief Complaint No chief complaint on file.   HPI Janice Williams is a 60 y.o. female.   Patient presents with 2-week history of fatigue, weakness, shakiness, cough, nasal congestion.  Patient had telephone call with primary care on 11/24/2021 and was prescribed cefdinir antibiotic and methylprednisone pack.  She has completed both these medications.  She then saw an eye doctor for "eye infection" and was prescribed eyedrops as well as cephalexin antibiotic.  She only took a few pills of the cephalexin antibiotic and stopped because "it was making her shaky".  Patient reports today due to persistent symptoms, and patient reports that they are worsening.  She also has some associated shortness of breath that is intermittent.  Patient reports history of COPD and uses albuterol inhaler but has not had to use it more often while being sick.  Denies any known fevers or sick contacts.    Past Medical History:  Diagnosis Date   Allergy    Anemia    Arthritis    lower back, knees   Bronchitis    COPD (chronic obstructive pulmonary disease) (HCC)    COPD with acute exacerbation ([place]) 06/09/2017   Dental abscess 03/15/2015   DYSPEPSIA 02/14/2007   Qualifier: Diagnosis of  By: Samara Snide     EXTERNAL OTITIS 01/06/2010   Qualifier: Diagnosis of  By: Tomma Lightning MD, Claiborne Billings     FACIAL RASH 02/04/2009   Qualifier: Diagnosis of  By: Rhunette Croft     Former smoker    quit 2014   GERD (gastroesophageal reflux disease)    HIV infection ([place])    Hypertension    Stroke ([place])    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, clean 7 years   SVD (spontaneous vaginal delivery)    x 3    Patient Active Problem List   Diagnosis Date Noted   Need for shingles vaccine 08/14/2021   Constipation 05/12/2021   Cough 03/30/2021   Screening for cervical cancer 03/18/2021   Ear pain, bilateral 03/17/2021   Degenerative  disc disease, cervical 10/13/2020   Chronic, continuous use of opioids 09/10/2020   Rhinitis, chronic 06/10/2020   Restless legs syndrome (RLS) 01/08/2020   Osteoporosis 10/10/2019   COPD  GOLD 3  07/08/2019   Moderate protein-calorie malnutrition ([place]) 05/31/2018   Hypertension 06/08/2017   Left knee pain 03/15/2015   Healthcare maintenance 07/06/2011   Hyperlipidemia 06/03/2009   WEIGHT LOSS 06/03/2009   Human immunodeficiency virus (HIV) disease ([place]) 10/29/2008   GERD 10/29/2008   CEREBROVASCULAR ACCIDENT, HX OF 10/29/2008   TOBACCO DEPENDENCE 02/14/2007   Allergic rhinitis 02/14/2007   Symptoms concerning nutrition, metabolism, and development 02/14/2007    Past Surgical History:  Procedure Laterality Date   MULTIPLE TOOTH EXTRACTIONS     with sedation   TUBAL LIGATION     UPPER GI ENDOSCOPY     normal per patient - "years ago"    OB History     Gravida  3   Para  3   Term  3   Preterm      AB      Living  3      SAB      IAB      Ectopic      Multiple      Live Births  3  Home Medications    Prior to Admission medications   Medication Sig Start Date End Date Taking? Authorizing Provider  albuterol (VENTOLIN HFA) 108 (90 Base) MCG/ACT inhaler Inhale 2 puffs into the lungs every 6 (six) hours as needed for wheezing or shortness of breath.    [provider]  amLODipine (NORVASC) 10 MG tablet TAKE 1 TABLET(10 MG) BY MOUTH DAILY 09/13/21   Madalyn Rob, MD  bictegravir-emtricitabine-tenofovir AF (BIKTARVY) 50-200-25 MG TABS tablet TAKE 1 TABLET BY MOUTH DAILY. 09/02/21   Carlyle Basques, MD  bictegravir-emtricitabine-tenofovir AF (BIKTARVY) 50-200-25 MG TABS tablet Take 1 tablet by mouth daily. 09/26/21   Golden Circle, FNP  budesonide-formoterol (SYMBICORT) 80-4.5 MCG/ACT inhaler TAKE 2 PUFFS FIRST THING IN THE MORNING AND THEN ANOTHER 2 PUFFS 12 HOURS LATER 03/14/21   Tanda Rockers, MD  budesonide-formoterol [facility])  80-4.5 MCG/ACT inhaler Inhale 2 puffs into the lungs 2 (two) times daily. 07/13/21   Tanda Rockers, MD  cefdinir (OMNICEF) 300 MG capsule Take 1 capsule (300 mg total) by mouth 2 (two) times daily. 11/24/21   Tanda Rockers, MD  Ensure (ENSURE) Take 237 mLs by mouth 3 (three) times daily between meals. 10/28/21   Carlyle Basques, MD  fluticasone (FLONASE) 50 MCG/ACT nasal spray Place 1 spray into both nostrils daily. 08/11/21   Madalyn Rob, MD  levocetirizine (XYZAL) 5 MG tablet Take 5 mg by mouth every evening.    [provider]  megestrol (MEGACE) 40 MG/ML suspension Take 10 mLs (400 mg total) by mouth daily. 11/01/21   Madalyn Rob, MD  omeprazole (PRILOSEC) 20 MG capsule Take 1 capsule (20 mg total) by mouth daily. 11/16/20   Masoudi, Dorthula Rue, MD  pravastatin (PRAVACHOL) 40 MG tablet Take 1 tablet (40 mg total) by mouth daily. 08/11/21   Madalyn Rob, MD  PRESCRIPTION MEDICATION Inject 1 Dose into the skin once a week. Allergy shot in office on Tuesdays    [provider]  senna-docusate (SENOKOT-S) 8.6-50 MG tablet Take 1 tablet by mouth daily. May adjust to achieve 1 bowel movement daily Patient not taking: No sig reported 05/12/21   Andrew Au, MD  sorbitol 70 % solution Take 15 mLs by mouth daily as needed. 10/14/21   Madalyn Rob, MD  traMADol (ULTRAM) 50 MG tablet Take 1 tablet (50 mg total) by mouth every 12 (twelve) hours as needed. 08/11/21   Aldine Contes, MD    Family History Family History  Problem Relation Age of Onset   Hypertension Mother    Hypertension Father    Hyperlipidemia Father    COPD Father    Atrial fibrillation Father    Colon cancer Neg Hx    Rectal cancer Neg Hx    Stomach cancer Neg Hx     Social History Social History   Tobacco Use   Smoking status: Former    Packs/day: 0.40    Years: 38.00    Pack years: 15.20    Types: Cigarettes    Start date: 12/18/2012    Quit date: 11/21/2013    Years since quitting: 8.0    Smokeless tobacco: Never  Vaping Use   Vaping Use: Never used  Substance Use Topics   Alcohol use: Not Currently   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     Allergies   Chantix [varenicline]   Review of Systems Review of Systems Per HPI  Physical Exam Triage Vital Signs ED Triage Vitals  Enc  Vitals Group     BP 12/08/21 1521 (!) 168/94     Pulse Rate 12/08/21 1521 (!) 122     Resp 12/08/21 1521 18     Temp 12/08/21 1521 98.5 F (36.9 C)     Temp Source 12/08/21 1521 Oral     SpO2 12/08/21 1521 95 %     Weight --      Height --      Head Circumference --      Peak Flow --      Pain Score 12/08/21 1523 0     Pain Loc --      Pain Edu? --      Excl. in [place]? --    No data found.  Updated Vital Signs BP (!) 168/94 (BP Location: Left Arm)    Pulse (!) 122    Temp 98.5 F (36.9 C) (Oral)    Resp 18    SpO2 95%   Visual Acuity Right Eye Distance:   Left Eye Distance:   Bilateral Distance:    Right Eye Near:   Left Eye Near:    Bilateral Near:     Physical Exam Constitutional:      General: She is not in acute distress.    Appearance: Normal appearance. She is not toxic-appearing or diaphoretic.  HENT:     Head: Normocephalic and atraumatic.     Right Ear: Tympanic membrane and ear canal normal.     Left Ear: Tympanic membrane and ear canal normal.     Nose: Congestion present.     Mouth/Throat:     Mouth: Mucous membranes are moist.     Pharynx: No posterior oropharyngeal erythema.  Eyes:     Extraocular Movements: Extraocular movements intact.     Conjunctiva/sclera: Conjunctivae normal.     Pupils: Pupils are equal, round, and reactive to light.  Cardiovascular:     Rate and Rhythm: Normal rate and regular rhythm.     Pulses: Normal pulses.     Heart sounds: Normal heart sounds.  Pulmonary:     Effort: Pulmonary effort is normal. No respiratory distress.     Breath sounds: Normal breath sounds. No stridor. No wheezing, rhonchi or rales.   Abdominal:     General: Abdomen is flat. Bowel sounds are normal.     Palpations: Abdomen is soft.  Musculoskeletal:        General: Normal range of motion.     Cervical back: Normal range of motion.  Skin:    General: Skin is warm and dry.  Neurological:     General: No focal deficit present.     Mental Status: She is alert and oriented to person, place, and time. Mental status is at baseline.  Psychiatric:        Mood and Affect: Mood normal.        Behavior: Behavior normal.     UC Treatments / Results  Labs (all labs ordered are listed, but only abnormal results are displayed) Labs Reviewed - No data to display  EKG   Radiology No results found.  Procedures Procedures (including critical care time)  Medications Ordered in UC Medications - No data to display  Initial Impression / Assessment and Plan / UC Course  I have reviewed the triage vital signs and the nursing notes.  Pertinent labs & imaging results that were available during my care of the patient were reviewed by me and considered in my medical decision making (see chart for  details).     It is highly likely that patient is having a COPD exacerbation.  It also appears that patient's  symptoms are viral in etiology.  Patient has already been on 2 antibiotics as well as steroid treatment, so chest x-ray is warranted at this time.  I do not have an x-ray tech in urgent care today so outpatient imaging would have to be ordered.  Offered patient outpatient imaging but she declined.  Advised patient that if she does not want to do outpatient imaging, that she would need to go to the hospital for further evaluation and management because imaging may be necessary to determine if there is more worrisome etiologies underlying causing her symptoms.  Patient was agreeable with plan.  Vital signs stable at discharge.  Agree with patient self transport to the hospital. Final Clinical Impressions(s) / UC Diagnoses   Final  diagnoses:  Persistent cough  Other fatigue     Discharge Instructions      Please go to emergency department as soon as you leave urgent care for further evaluation and management.    ED Prescriptions   None    PDMP not reviewed this encounter.   Teodora Medici, [place] 12/08/21 [phone]

## 2021-12-09 ENCOUNTER — Telehealth: Payer: Self-pay

## 2021-12-09 NOTE — Telephone Encounter (Signed)
Transition Care Management Unsuccessful Follow-up Telephone Call  Date of discharge and from where:  12/08/2021-[facility]  Attempts:  1st Attempt  Reason for unsuccessful TCM follow-up call:  Left voice message

## 2021-12-10 NOTE — Telephone Encounter (Signed)
Transition Care Management Follow-up Telephone Call Date of discharge and from where: 12/08/2021-[facility]  How have you been since you were released from the hospital? Patient stated she is feeling a little better.  Any questions or concerns? No  Items Reviewed: Did the pt receive and understand the discharge instructions provided? Yes  Medications obtained and verified? Yes  Other? No  Any new allergies since your discharge? No  Dietary orders reviewed? No Do you have support at home? Yes   Home Care and Equipment/Supplies: Were home health services ordered? not applicable If so, what is the name of the agency? N/A  Has the agency set up a time to come to the patient's home? not applicable Were any new equipment or medical supplies ordered?  No What is the name of the medical supply agency? N/A Were you able to get the supplies/equipment? not applicable Do you have any questions related to the use of the equipment or supplies? No  Functional Questionnaire: (I = Independent and D = Dependent) ADLs: I  Bathing/Dressing- I  Meal Prep- I  Eating- I  Maintaining continence- I  Transferring/Ambulation- I  Managing Meds- I  Follow up appointments reviewed:  PCP Hospital f/u appt confirmed? No  . [facility] f/u appt confirmed? No   Are transportation arrangements needed? No  If their condition worsens, is the pt aware to call PCP or go to the Emergency Dept.? Yes Was the patient provided with contact information for the PCP's office or ED? Yes Was to pt encouraged to call back with questions or concerns? Yes

## 2021-12-12 ENCOUNTER — Emergency Department (HOSPITAL_COMMUNITY)
Admission: EM | Admit: 2021-12-12 | Discharge: 2021-12-12 | Disposition: A | Discharge status: Home / Self Care | Payer: Medicaid Other | Attending: Student | Admitting: Student

## 2021-12-12 ENCOUNTER — Encounter (HOSPITAL_COMMUNITY): Payer: Self-pay

## 2021-12-12 ENCOUNTER — Other Ambulatory Visit: Payer: Self-pay

## 2021-12-12 ENCOUNTER — Emergency Department (HOSPITAL_COMMUNITY): Payer: Medicaid Other

## 2021-12-12 DIAGNOSIS — Z7951 Long term (current) use of inhaled steroids: Secondary | ICD-10-CM | POA: Insufficient documentation

## 2021-12-12 DIAGNOSIS — I10 Essential (primary) hypertension: Secondary | ICD-10-CM | POA: Diagnosis not present

## 2021-12-12 DIAGNOSIS — J449 Chronic obstructive pulmonary disease, unspecified: Secondary | ICD-10-CM | POA: Diagnosis not present

## 2021-12-12 DIAGNOSIS — Z87891 Personal history of nicotine dependence: Secondary | ICD-10-CM | POA: Insufficient documentation

## 2021-12-12 DIAGNOSIS — J069 Acute upper respiratory infection, unspecified: Secondary | ICD-10-CM | POA: Diagnosis not present

## 2021-12-12 DIAGNOSIS — N764 Abscess of vulva: Secondary | ICD-10-CM | POA: Diagnosis not present

## 2021-12-12 DIAGNOSIS — Z79899 Other long term (current) drug therapy: Secondary | ICD-10-CM | POA: Insufficient documentation

## 2021-12-12 DIAGNOSIS — R059 Cough, unspecified: Secondary | ICD-10-CM | POA: Diagnosis not present

## 2021-12-12 IMAGING — CR DG CHEST 2V
2 series · 2 of 2 positions shown · non-contrast
Comparison: [DATE]

CLINICAL DATA: Cough.  COPD and bronchitis.

EXAM:
CHEST - 2 VIEW

[w chest pa]
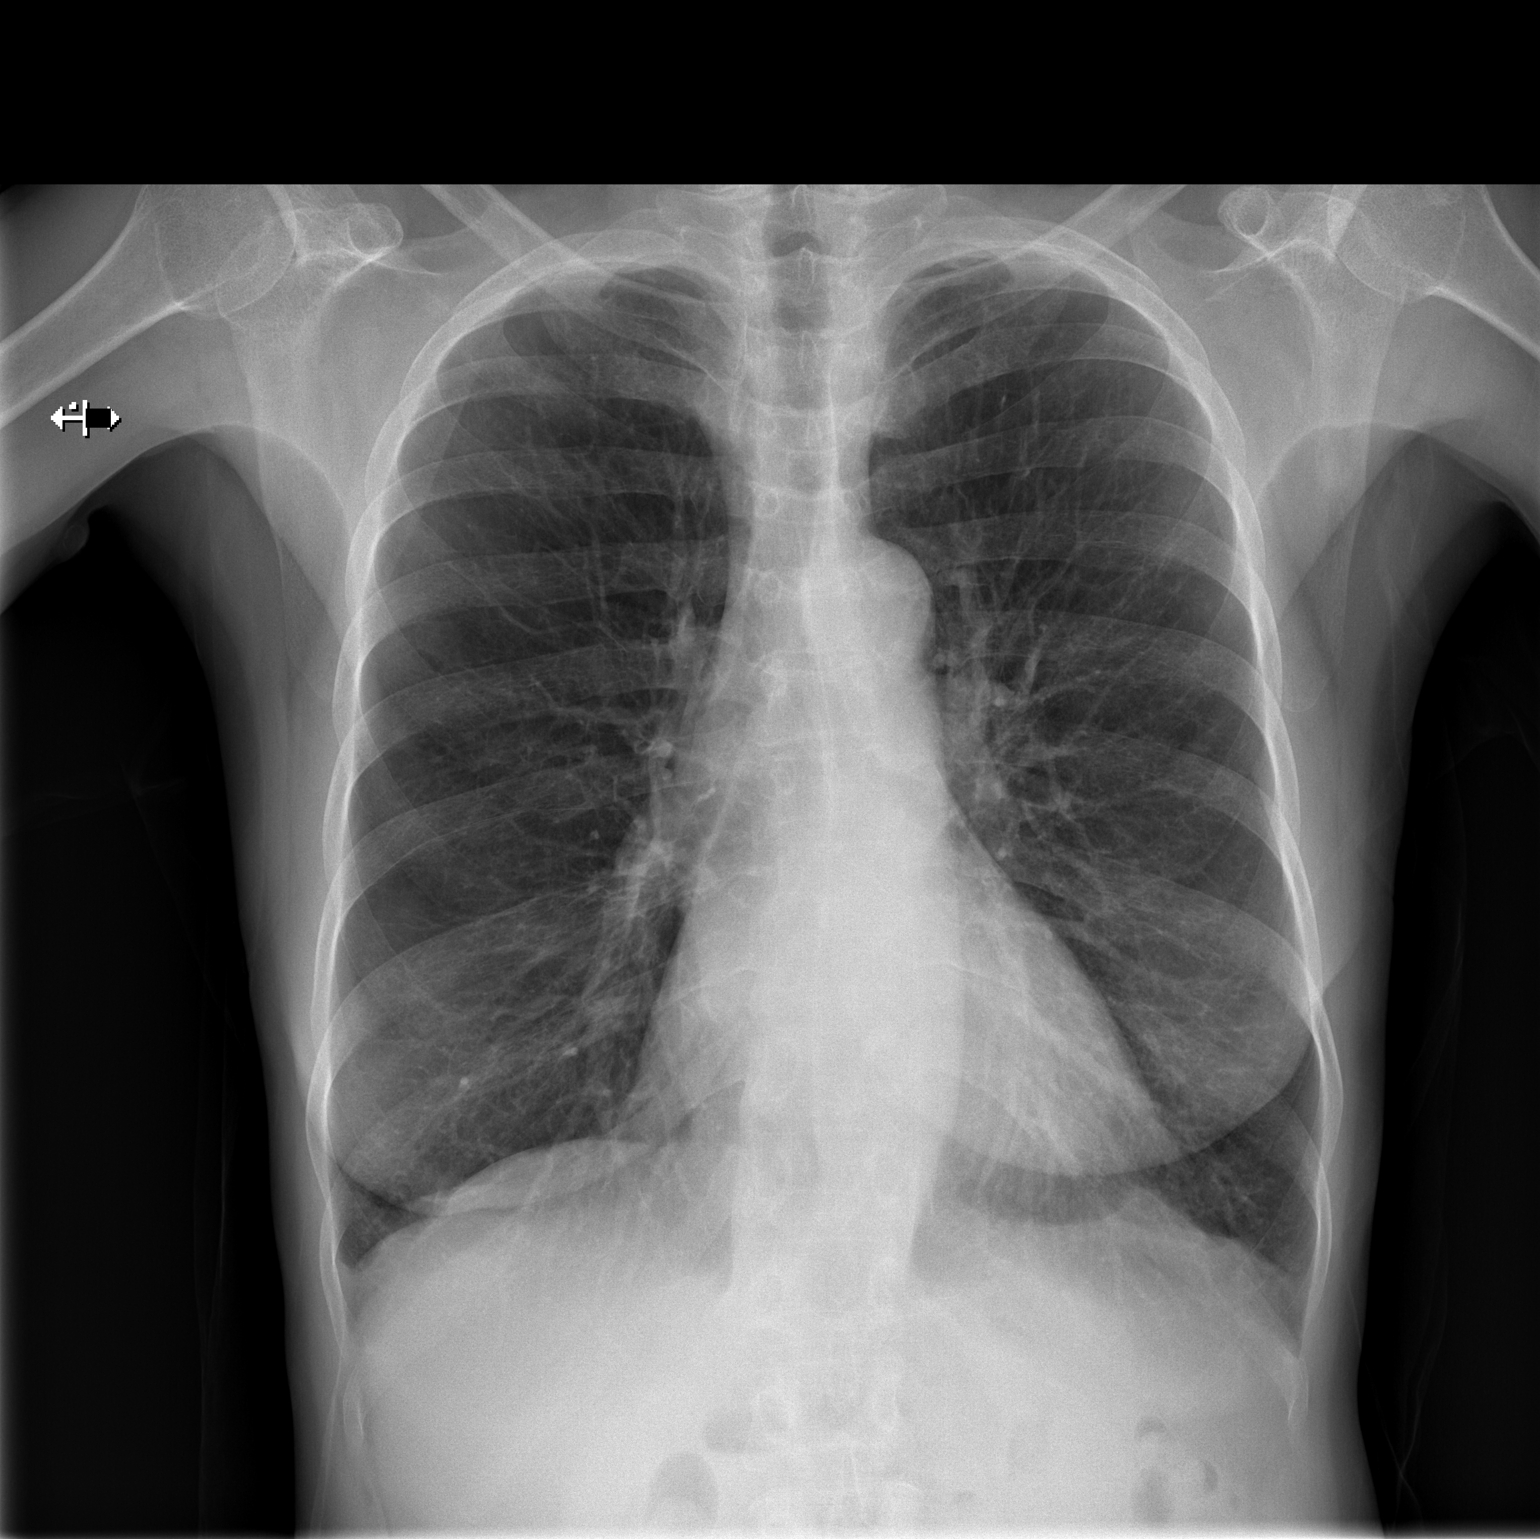

[w chest lat]
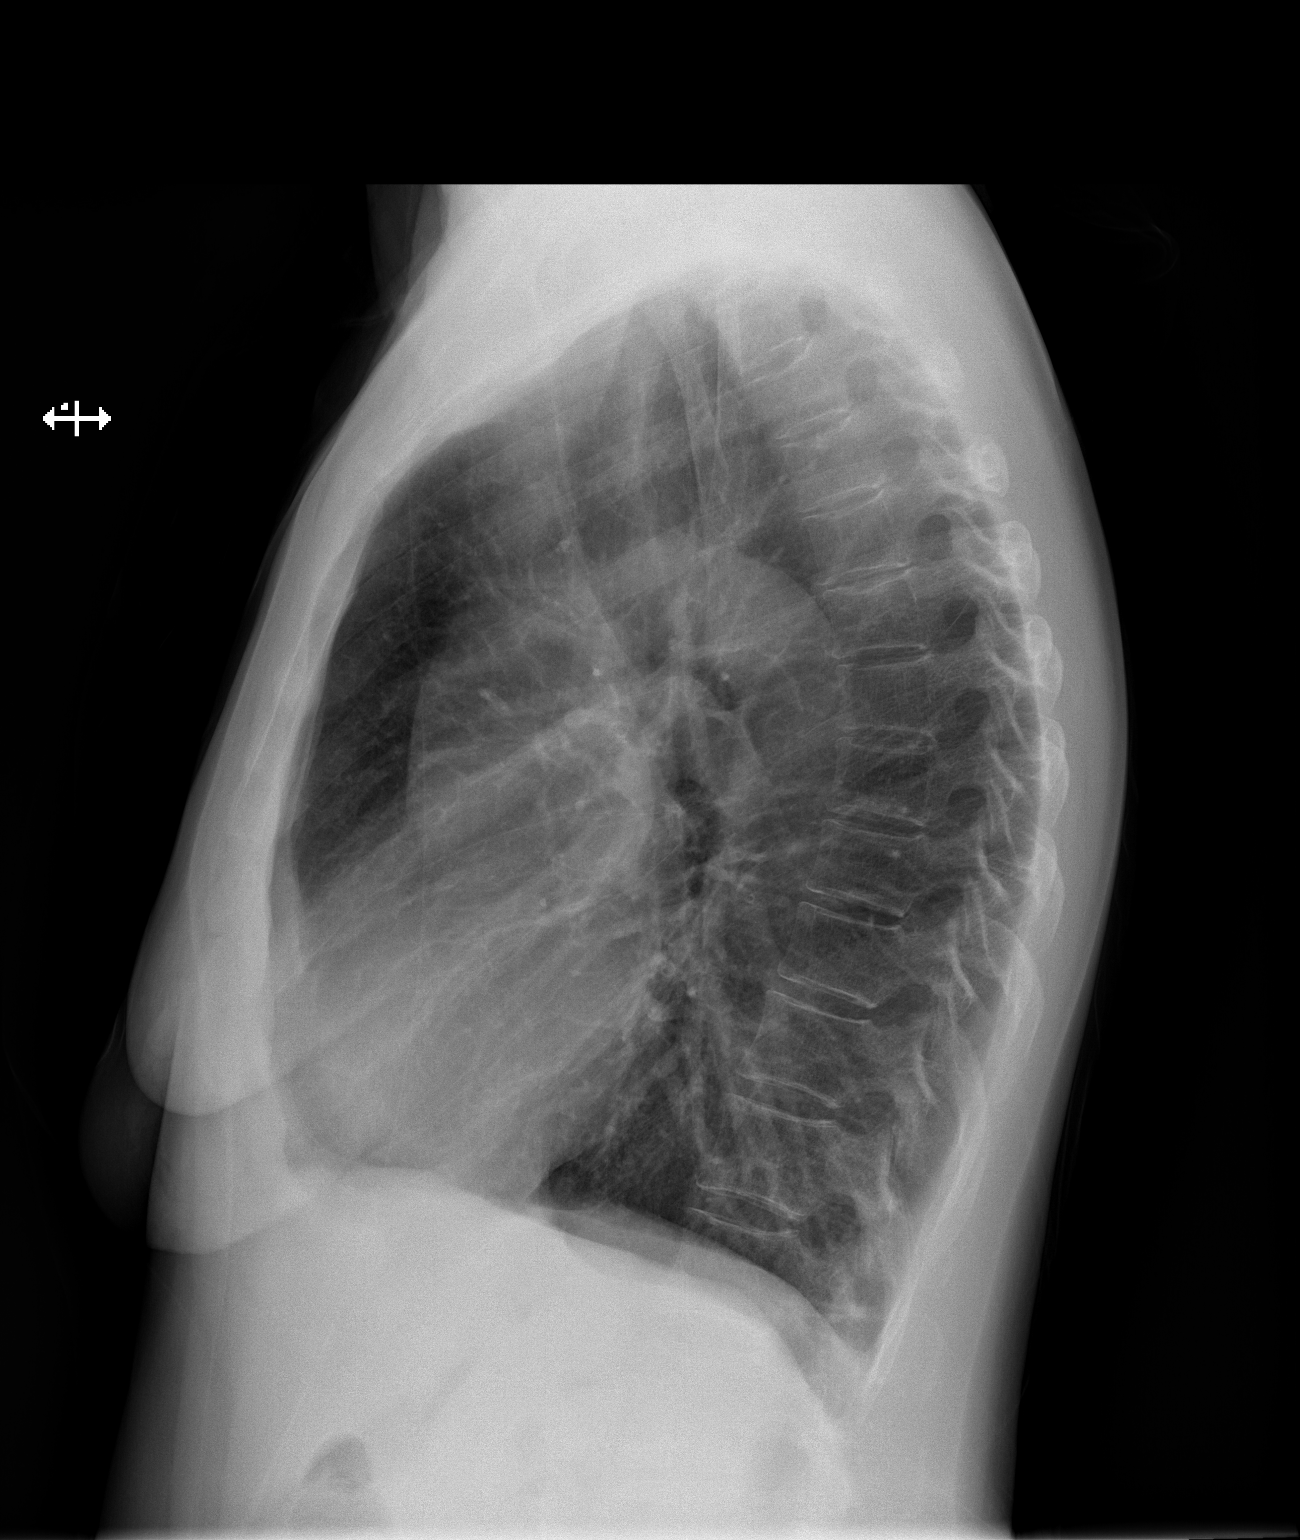

[2 of 2 positions shown; findings below may reference images not displayed]

FINDINGS: Mild hyperinflation. Midline trachea. Normal heart size and
mediastinal contours. No pleural effusion or pneumothorax. Clear
lungs.
IMPRESSION: Hyperinflation, without acute disease.

## 2021-12-12 MED ORDER — SODIUM CHLORIDE 0.9 % IV BOLUS
500.0000 mL | Freq: Once | INTRAVENOUS | Status: AC
  Administered 2021-12-12: 21:00:00 500 mL via INTRAVENOUS

## 2021-12-12 MED ORDER — LIDOCAINE-EPINEPHRINE (PF) 2 %-1:200000 IJ SOLN
10.0000 mL | Freq: Once | INTRAMUSCULAR | Status: AC
  Administered 2021-12-12: 10 mL
  Filled 2021-12-12: qty 20

## 2021-12-12 MED ORDER — KETOROLAC TROMETHAMINE 15 MG/ML IJ SOLN
15.0000 mg | Freq: Once | INTRAMUSCULAR | Status: AC
  Administered 2021-12-12: 21:00:00 15 mg via INTRAVENOUS
  Filled 2021-12-12: qty 1

## 2021-12-12 MED ORDER — DOXYCYCLINE HYCLATE 100 MG PO CAPS: 100.0000 mg | ORAL_CAPSULE | Freq: Two times a day (BID) | ORAL | 0 refills | Status: DC

## 2021-12-12 NOTE — ED Provider Notes (Addendum)
[facility] DEPT Provider Note   CSN: [id number] Arrival date & time: 12/12/21  1215     History Chief Complaint  Patient presents with   Abscess   Headache    Janice Williams is a 60 y.o. female with history of COPD and HIV who presents the emergency department for headache and labial abscess.  I personally saw this patient on 12/22, and she was diagnosed with a viral upper respiratory infection with cough.  She overall states the symptoms have improved, but she does have a lingering headache.  She states that she had some swelling of her left labia for the past week, that has been growing in size and is more painful.  She states she has a history of recurrent abscesses in this area requiring drainage.  No fevers, chills.   Abscess Associated symptoms: headaches   Associated symptoms: no fever, no nausea and no vomiting   Headache Associated symptoms: no abdominal pain, no fever, no nausea and no vomiting       Past Medical History:  Diagnosis Date   Allergy    Anemia    Arthritis    lower back, knees   Bronchitis    COPD (chronic obstructive pulmonary disease) (HCC)    COPD with acute exacerbation ([place]) 06/09/2017   Dental abscess 03/15/2015   DYSPEPSIA 02/14/2007   Qualifier: Diagnosis of  By: Samara Snide     EXTERNAL OTITIS 01/06/2010   Qualifier: Diagnosis of  By: Tomma Lightning MD, [place] 02/04/2009   Qualifier: Diagnosis of  By: Rhunette Croft     Former smoker    quit 2014   GERD (gastroesophageal reflux disease)    HIV infection ([place])    Hypertension    Stroke (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, clean 7 years   SVD (spontaneous vaginal delivery)    x 3    Patient Active Problem List   Diagnosis Date Noted   Need for shingles vaccine 08/14/2021   Constipation 05/12/2021   Cough 03/30/2021   Screening for cervical cancer 03/18/2021   Ear pain, bilateral 03/17/2021   Degenerative disc disease, cervical  10/13/2020   Chronic, continuous use of opioids 09/10/2020   Rhinitis, chronic 06/10/2020   Restless legs syndrome (RLS) 01/08/2020   Osteoporosis 10/10/2019   COPD  GOLD 3  07/08/2019   Moderate protein-calorie malnutrition ([place]) 05/31/2018   Hypertension 06/08/2017   Left knee pain 03/15/2015   Healthcare maintenance 07/06/2011   Hyperlipidemia 06/03/2009   WEIGHT LOSS 06/03/2009   Human immunodeficiency virus (HIV) disease ([place]) 10/29/2008   GERD 10/29/2008   CEREBROVASCULAR ACCIDENT, HX OF 10/29/2008   TOBACCO DEPENDENCE 02/14/2007   Allergic rhinitis 02/14/2007   Symptoms concerning nutrition, metabolism, and development 02/14/2007    Past Surgical History:  Procedure Laterality Date   MULTIPLE TOOTH EXTRACTIONS     with sedation   TUBAL LIGATION     UPPER GI ENDOSCOPY     normal per patient - "years ago"     OB History     Gravida  3   Para  3   Term  3   Preterm      AB      Living  3      SAB      IAB      Ectopic      Multiple      Live Births  3  Family History  Problem Relation Age of Onset   Hypertension Mother    Hypertension Father    Hyperlipidemia Father    COPD Father    Atrial fibrillation Father    Colon cancer Neg Hx    Rectal cancer Neg Hx    Stomach cancer Neg Hx     Social History   Tobacco Use   Smoking status: Former    Packs/day: 0.40    Years: 38.00    Pack years: 15.20    Types: Cigarettes    Start date: 12/18/2012    Quit date: 11/21/2013    Years since quitting: 8.0   Smokeless tobacco: Never  Vaping Use   Vaping Use: Never used  Substance Use Topics   Alcohol use: Not Currently   Drug use: No    Comment: Hx - clean since [place] Medications Prior to Admission medications   Medication Sig Start Date End Date Taking? Authorizing Provider  albuterol (VENTOLIN HFA) 108 (90 Base) MCG/ACT inhaler Inhale 2 puffs into the lungs every 6 (six) hours as needed for wheezing or shortness of  breath.   Yes [provider]  amLODipine (NORVASC) 10 MG tablet TAKE 1 TABLET(10 MG) BY MOUTH DAILY 09/13/21  Yes Madalyn Rob, MD  azelastine (ASTELIN) 0.1 % nasal spray 1 spray 2 (two) times daily. 08/15/21  Yes [provider]  benzonatate (TESSALON) 100 MG capsule Take 1 capsule (100 mg total) by mouth every 8 (eight) hours. Patient taking differently: Take 100 mg by mouth 3 (three) times daily as needed for cough. 12/08/21  Yes Gerell Fortson T, PA-C  bictegravir-emtricitabine-tenofovir AF (BIKTARVY) 50-200-25 MG TABS tablet TAKE 1 TABLET BY MOUTH DAILY. 09/02/21  Yes Carlyle Basques, MD  budesonide-formoterol (SYMBICORT) 80-4.5 MCG/ACT inhaler TAKE 2 PUFFS FIRST THING IN THE MORNING AND THEN ANOTHER 2 PUFFS 12 HOURS LATER 03/14/21  Yes Tanda Rockers, MD  cromolyn (OPTICROM) 4 % ophthalmic solution 1 drop in the morning and at bedtime.   Yes [provider]  doxycycline (VIBRAMYCIN) 100 MG capsule Take 1 capsule (100 mg total) by mouth 2 (two) times daily. 12/12/21  Yes Jalisa Sacco T, PA-C  Ensure (ENSURE) Take 237 mLs by mouth 3 (three) times daily between meals. 10/28/21  Yes Carlyle Basques, MD  fluticasone [facility]) 50 MCG/ACT nasal spray Place 1 spray into both nostrils daily. Patient taking differently: Place 1 spray into both nostrils in the morning and at bedtime. 08/11/21  Yes Madalyn Rob, MD  gabapentin (NEURONTIN) 300 MG capsule Take 300 mg by mouth at bedtime. 10/24/21  Yes [provider]  levocetirizine (XYZAL) 5 MG tablet Take 5 mg by mouth every evening.   Yes [provider]  megestrol (MEGACE) 40 MG/ML suspension Take 10 mLs (400 mg total) by mouth daily. 11/01/21  Yes Madalyn Rob, MD  neomycin-polymyxin b-dexamethasone (MAXITROL) 2.4-58099-8.3 OINT 1 application at bedtime. 12/06/21  Yes [provider]  omeprazole (PRILOSEC) 20 MG capsule Take 1 capsule (20 mg total) by mouth daily. 11/16/20  Yes Masoudi,  Elhamalsadat, MD  pravastatin (PRAVACHOL) 40 MG tablet Take 1 tablet (40 mg total) by mouth daily. 08/11/21  Yes Madalyn Rob, MD  RESTASIS 0.05 % ophthalmic emulsion 1 drop 2 (two) times daily. 08/05/21  Yes [provider]  traMADol (ULTRAM) 50 MG tablet Take 1 tablet (50 mg total) by mouth every 12 (twelve) hours as needed. 08/11/21  Yes Aldine Contes, MD  bictegravir-emtricitabine-tenofovir AF (BIKTARVY) 50-200-25 MG TABS tablet  Take 1 tablet by mouth daily. 09/26/21   Gold[place] Circle, FNP  budesonide-formoterol (SYMBICORT) 80-4.5 MCG/ACT inhaler Inhale 2 puffs into the lungs 2 (two) times daily. 07/13/21   Tanda Rockers, MD  cefdinir (OMNICEF) 300 MG capsule Take 1 capsule (300 mg total) by mouth 2 (two) times daily. Patient not taking: Reported on 12/12/2021 11/24/21   Tanda Rockers, MD  PRESCRIPTION MEDICATION Inject 1 Dose into the skin once a week. Allergy shot in office on Tuesdays    [provider]  senna-docusate (SENOKOT-S) 8.6-50 MG tablet Take 1 tablet by mouth daily. May adjust to achieve 1 bowel movement daily Patient not taking: Reported on 09/14/2021 05/12/21   Andrew Au, MD  sorbitol 70 % solution Take 15 mLs by mouth daily as needed. Patient not taking: Reported on 12/12/2021 10/14/21   Madalyn Rob, MD    Allergies    Chantix [varenicline]  Review of Systems   Review of Systems  Constitutional:  Negative for chills and fever.  Gastrointestinal:  Negative for abdominal pain, nausea and vomiting.  Genitourinary:  Negative for dysuria, flank pain, pelvic pain and vaginal bleeding.  Skin:        Abscess  Neurological:  Positive for headaches.  All other systems reviewed and are negative.  Physical Exam Updated Vital Signs BP 139/84 (BP Location: Left Arm)    Pulse 98    Temp 98.9 F (37.2 C) (Oral)    Resp 17    Ht 5\' 4"  (1.626 m)    Wt 51.7 kg    SpO2 98%    BMI 19.57 kg/m   Physical Exam Vitals and nursing note reviewed.   Constitutional:      Appearance: Normal appearance.  HENT:     Head: Normocephalic and atraumatic.  Eyes:     Conjunctiva/sclera: Conjunctivae normal.  Cardiovascular:     Rate and Rhythm: Normal rate and regular rhythm.  Pulmonary:     Effort: Pulmonary effort is normal. No respiratory distress.     Breath sounds: Normal breath sounds.  Abdominal:     General: There is no distension.     Palpations: Abdomen is soft.     Tenderness: There is no abdominal tenderness.  Skin:    General: Skin is warm and dry.     Comments: Approximately 2 cm area of erythema and fluctuance over the left labia  Neurological:     General: No focal deficit present.     Mental Status: She is alert.    ED Results / Procedures / Treatments   Labs (all labs ordered are listed, but only abnormal results are displayed) Labs Reviewed - No data to display  EKG None  Radiology DG Chest 2 View  Result Date: 12/12/2021 CLINICAL DATA:  Cough.  COPD and bronchitis. EXAM: CHEST - 2 VIEW COMPARISON:  12/08/2021 FINDINGS: Mild hyperinflation. Midline trachea. Normal heart size and mediastinal contours. No pleural effusion or pneumothorax. Clear lungs. IMPRESSION: Hyperinflation, without acute disease. Electronically Signed   By: Abigail Miyamoto M.D.   On: 12/12/2021 13:53    Procedures .Marland KitchenIncision and Drainage  Date/Time: 12/12/2021 11:04 PM Performed by: Kateri Plummer, PA-C Authorized by: Kateri Plummer, PA-C   Consent:    Consent obtained:  Verbal   Consent given by:  Patient   Risks discussed:  Bleeding, incomplete drainage, pain and damage to other organs   Alternatives discussed:  No treatment Universal protocol:    Procedure explained and questions answered to  patient or proxy's satisfaction: yes     Relevant documents present and verified: yes     Test results available : yes     Imaging studies available: yes     Required blood products, implants, devices, and special equipment  available: yes     Site/side marked: yes     Immediately prior to procedure, a time out was called: yes     Patient identity confirmed:  Verbally with patient Location:    Type:  Abscess   Size:  2 cm   Location:  Anogenital   Anogenital location:  [place] Pre-procedure details:    Skin preparation:  Chlorhexidine with alcohol Anesthesia:    Anesthesia method:  Local infiltration   Local anesthetic:  Lidocaine 2% WITH epi Procedure type:    Complexity:  Complex Procedure details:    Incision types:  Single straight   Incision depth:  Subcutaneous   Wound management:  Probed and deloculated, irrigated with saline and extensive cleaning   Drainage:  Purulent   Drainage amount:  Moderate   Packing materials:  None Post-procedure details:    Procedure completion:  Tolerated well, no immediate complications   Medications Ordered in ED Medications  sodium chloride 0.9 % bolus 500 mL (0 mLs Intravenous Stopped 12/12/21 2238)  ketorolac (TORADOL) 15 MG/ML injection 15 mg (15 mg Intravenous Given 12/12/21 2126)  lidocaine-EPINEPHrine (XYLOCAINE W/EPI) 2 %-1:200000 (PF) injection 10 mL (10 mLs Infiltration Given by Other 12/12/21 2230)    ED Course  I have reviewed the triage vital signs and the nursing notes.  Pertinent labs & imaging results that were available during my care of the patient were reviewed by me and considered in my medical decision making (see chart for details).    MDM Rules/Calculators/A&P                          Patient is 60 year old female with history of COPD and HIV who presents emergency department complaining of a headache and labial abscess.  On my exam patient states she has had a lingering headache as result of her upper respiratory infection.  She has been trying over-the-counter medication without relief.  She states it feels like a normal headache for her and she is requesting more medication.  Patient states that her pain improved with IV fluids and  Toradol.  She also has a 2 cm left labial abscess. Patient with skin abscess amenable to incision and drainage.  Abscess was not large enough to warrant packing or drain,  wound recheck in 2 days. Encouraged home warm soaks and flushing.  Mild signs of cellulitis is surrounding skin.  Will d/c to home.  Antibiotic therapy is indicated. All questions answered and patient is agreeable to the plan.   Final Clinical Impression(s) / ED Diagnoses Final diagnoses:  Labial abscess    Rx / DC Orders ED Discharge Orders          Ordered    doxycycline (VIBRAMYCIN) 100 MG capsule  2 times daily        12/12/21 2218           Portions of this report may have been transcribed using voice recognition software. Every effort was made to ensure accuracy; however, inadvertent computerized transcription errors may be present.    Kateri Plummer, PA-C 12/12/21 2235    Kateri Plummer, PA-C 12/12/21 2305    Teressa Lower, MD 12/12/21 [phone]

## 2021-12-12 NOTE — ED Triage Notes (Signed)
Patient reports that she has a vaginal abscess since last night and a headache and pressure behind her eyes for more than a week. Paitent c/o light sensitivity.

## 2021-12-12 NOTE — Discharge Instructions (Addendum)
You were seen in the emergency department for a headache and an abscess.   We have drained this and I am placing you on antibiotics for the next 5 days.  You can take ibuprofen or Tylenol as needed for pain.  Continue to monitor how you're doing and return to the ER for new or worsening symptoms such as fevers, chills, increased redness or tenderness from the site.   It has been a pleasure seeing and caring for you today and I hope you start feeling better soon!

## 2021-12-12 NOTE — ED Notes (Signed)
I&D tray at pt.'s bedside.

## 2021-12-12 NOTE — ED Provider Notes (Signed)
Emergency Medicine Provider Triage Evaluation Note  Janice Williams , a 60 y.o. female  was evaluated in triage.  Pt complains of abscess to left groin, headache, productive cough, and sinus pressure.  Patient states that she noticed abscess to left groin yesterday.  Denies any discharge from this area.  Complains of sinus pressure and headache x2 weeks.  States that symptoms have been constant over this time and gradually worsening.  Endorses rhinorrhea and nasal congestion.  Patient also complains of productive cough.  Review of Systems  Positive: Rhinorrhea, nasal congestion, sinus pressure, headache, productive cough, abscess Negative: Fever, chills, generalized myalgia, abdominal pain, nausea, vomiting, diarrhea, numbness, weakness  Physical Exam  BP (!) 138/95 (BP Location: Left Arm)    Pulse 100    Temp 98.4 F (36.9 C) (Oral)    Resp 16    Ht 5\' 4"  (1.626 m)    Wt 51.7 kg    SpO2 99%    BMI 19.57 kg/m  Gen:   Awake, no distress   Resp:  Normal effort, lungs clear to auscultation bilaterally MSK:   Moves extremities without difficulty  Other:  Bilateral tenderness to maxillary sinuses.  Medical Decision Making  Medically screening exam initiated at 1:14 PM.  Appropriate orders placed.  Janice Williams was informed that the remainder of the evaluation will be completed by another provider, this initial triage assessment does not replace that evaluation, and the importance of remaining in the ED until their evaluation is complete.  Due to productive cough x7 days will obtain chest x-ray.  Patient swabbed for COVID/influenza on 2/22 declines further testing at this time.   Loni Beckwith, PA-C 12/12/21 1316    Lorelle Gibbs, DO 12/13/21 1636

## 2021-12-13 ENCOUNTER — Telehealth: Payer: Self-pay

## 2021-12-13 NOTE — Telephone Encounter (Signed)
Transition Care Management Follow-up Telephone Call Date of discharge and from where: 12/12/2021-[facility]  How have you been since you were released from the hospital? Patient stated she is doing fine and is on the way to get her medications.  Any questions or concerns? No  Items Reviewed: Did the pt receive and understand the discharge instructions provided? Yes  Medications obtained and verified? Yes  Other? No  Any new allergies since your discharge? No  Dietary orders reviewed? No Do you have support at home? Yes   Home Care and Equipment/Supplies: Were home health services ordered? not applicable If so, what is the name of the agency? N/A  Has the agency set up a time to come to the patient's home? not applicable Were any new equipment or medical supplies ordered?  No What is the name of the medical supply agency? N/A Were you able to get the supplies/equipment? not applicable Do you have any questions related to the use of the equipment or supplies? No  Functional Questionnaire: (I = Independent and D = Dependent) ADLs: I  Bathing/Dressing- I  Meal Prep- I  Eating- I  Maintaining continence- I  Transferring/Ambulation- I  Managing Meds- I  Follow up appointments reviewed:  PCP Hospital f/u appt confirmed? No   Specialist Hospital f/u appt confirmed? No   Are transportation arrangements needed? No  If their condition worsens, is the pt aware to call PCP or go to the Emergency Dept.? Yes Was the patient provided with contact information for the PCP's office or ED? Yes Was to pt encouraged to call back with questions or concerns? Yes

## 2021-12-18 DIAGNOSIS — Z419 Encounter for procedure for purposes other than remedying health state, unspecified: Secondary | ICD-10-CM | POA: Diagnosis not present

## 2021-12-20 DIAGNOSIS — M1990 Unspecified osteoarthritis, unspecified site: Secondary | ICD-10-CM | POA: Diagnosis not present

## 2021-12-21 DIAGNOSIS — M1990 Unspecified osteoarthritis, unspecified site: Secondary | ICD-10-CM | POA: Diagnosis not present

## 2021-12-22 DIAGNOSIS — M1990 Unspecified osteoarthritis, unspecified site: Secondary | ICD-10-CM | POA: Diagnosis not present

## 2021-12-23 DIAGNOSIS — Z7689 Persons encountering health services in other specified circumstances: Secondary | ICD-10-CM | POA: Diagnosis not present

## 2021-12-23 DIAGNOSIS — J301 Allergic rhinitis due to pollen: Secondary | ICD-10-CM | POA: Diagnosis not present

## 2021-12-23 DIAGNOSIS — M1990 Unspecified osteoarthritis, unspecified site: Secondary | ICD-10-CM | POA: Diagnosis not present

## 2021-12-23 DIAGNOSIS — J3089 Other allergic rhinitis: Secondary | ICD-10-CM | POA: Diagnosis not present

## 2021-12-26 DIAGNOSIS — M1990 Unspecified osteoarthritis, unspecified site: Secondary | ICD-10-CM | POA: Diagnosis not present

## 2021-12-27 ENCOUNTER — Other Ambulatory Visit (HOSPITAL_COMMUNITY): Payer: Self-pay

## 2021-12-27 DIAGNOSIS — M1990 Unspecified osteoarthritis, unspecified site: Secondary | ICD-10-CM | POA: Diagnosis not present

## 2021-12-28 ENCOUNTER — Other Ambulatory Visit: Payer: Self-pay

## 2021-12-28 ENCOUNTER — Ambulatory Visit (INDEPENDENT_AMBULATORY_CARE_PROVIDER_SITE_OTHER): Payer: Medicaid Other | Admitting: Internal Medicine

## 2021-12-28 ENCOUNTER — Encounter: Payer: Self-pay | Admitting: Internal Medicine

## 2021-12-28 DIAGNOSIS — Z7689 Persons encountering health services in other specified circumstances: Secondary | ICD-10-CM | POA: Diagnosis not present

## 2021-12-28 DIAGNOSIS — M1990 Unspecified osteoarthritis, unspecified site: Secondary | ICD-10-CM | POA: Diagnosis not present

## 2021-12-28 DIAGNOSIS — J309 Allergic rhinitis, unspecified: Secondary | ICD-10-CM

## 2021-12-28 NOTE — Patient Instructions (Addendum)
Dear Janice Williams,  Unfortunately we do not have many other treatments to offer you for your allergies, however, we can recommend different medications. You can try Janice Williams or Janice Williams for your eyes instead. We also recommend holding the Janice Williams and trying a different antihistamine like Janice Williams or Janice Williams for your allergies. For the Janice Williams, we recommend aiming towards the outside of your eyes to improve how it distributes throughout your sinuses. Please continue to take your [name] We also recommend that you call your Ear Nose and Throat specialists. Please continue to see your allergist.   We will also work on the paperwork to have the carpet removed from your apartment to improve your sinuses.  We will follow up in 1 month.

## 2021-12-28 NOTE — Progress Notes (Signed)
° °  CC: routine visit  HPI:[name] is a 61 y.o. female who presents for evaluation of routine visit. Please see individual problem based A/P for details.  Depression, PHQ-9: Based on the patients  [place] Visit from 08/11/2021 in [facility]  PHQ-9 Total Score 0      score we have 0.  Past Medical History:  Diagnosis Date   Allergy    Anemia    Arthritis    lower back, knees   Bronchitis    COPD (chronic obstructive pulmonary disease) (HCC)    COPD with acute exacerbation ([place]) 06/09/2017   Dental abscess 03/15/2015   DYSPEPSIA 02/14/2007   Qualifier: Diagnosis of  By: Samara Snide     EXTERNAL OTITIS 01/06/2010   Qualifier: Diagnosis of  By: Tomma Lightning MD, Claiborne Billings     FACIAL RASH 02/04/2009   Qualifier: Diagnosis of  By: Rhunette Croft     Former smoker    quit 2014   GERD (gastroesophageal reflux disease)    HIV infection ([place])    Hypertension    Stroke ([place])    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  HENT:  Positive for congestion, ear pain, nosebleeds and sinus pain.   Respiratory: Negative.    Cardiovascular: Negative.   Gastrointestinal: Negative.   Genitourinary: Negative.   Musculoskeletal: Negative.   Skin: Negative.   Neurological: Negative.   Endo/Heme/Allergies:  Positive for environmental allergies.  Psychiatric/Behavioral: Negative.      Physical Exam: There were no vitals filed for this visit.   General: alert and oriented HEENT: Conjunctiva nl , antiicteric sclerae, moist mucous membranes, no exudate or erythema, edentulous. Cardiovascular: Normal rate, regular rhythm.  No murmurs, rubs, or gallops Pulmonary : Equal breath sounds, No wheezes, rales, or rhonchi Abdominal: soft, nontender,  bowel sounds present Ext: No edema in lower extremities, no tenderness to palpation of  lower extremities.  Psych: tearful  Assessment & Plan:   See Encounters Tab for problem based charting.  Patient seen with Dr.  Saverio Danker

## 2021-12-29 ENCOUNTER — Other Ambulatory Visit: Payer: Self-pay | Admitting: Internal Medicine

## 2021-12-29 DIAGNOSIS — Z1231 Encounter for screening mammogram for malignant neoplasm of breast: Secondary | ICD-10-CM

## 2021-12-29 DIAGNOSIS — M1990 Unspecified osteoarthritis, unspecified site: Secondary | ICD-10-CM | POA: Diagnosis not present

## 2021-12-30 DIAGNOSIS — J3089 Other allergic rhinitis: Secondary | ICD-10-CM | POA: Diagnosis not present

## 2021-12-30 DIAGNOSIS — M1990 Unspecified osteoarthritis, unspecified site: Secondary | ICD-10-CM | POA: Diagnosis not present

## 2021-12-30 DIAGNOSIS — Z7689 Persons encountering health services in other specified circumstances: Secondary | ICD-10-CM | POA: Diagnosis not present

## 2021-12-30 DIAGNOSIS — J301 Allergic rhinitis due to pollen: Secondary | ICD-10-CM | POA: Diagnosis not present

## 2022-01-02 ENCOUNTER — Encounter: Payer: Self-pay | Admitting: Internal Medicine

## 2022-01-02 NOTE — Assessment & Plan Note (Addendum)
Patient presented with complaints of allergies. Reported bilateral ear fullness/pain, rhinorrhea, itching/burning/watery eyes. Endorsed sneezing, but no cough. She denies fever. She has been using flonase with no relief. Has also been using saline nasal rinses, but has stopped doing this after nose became irritated. Reports Xyzal has not helped, and Azelastine provided no relief and caused nausea. She reports that the carpet in her apartment is aggravating her symptoms and has begun the process of having it removed. Recommended patient try Pataday or Zatador for eye symptoms. Recommended Allegra or Claritin instead of Xyzal. Counseled patient on proper use of Flonase. Pt is not a good candidate for steroids due to HIV. Advised she follow up with ENT and continue to see allergist. Assisted with paperwork for carpet removal.

## 2022-01-03 NOTE — Progress Notes (Signed)
Internal Medicine Clinic Attending  I saw and evaluated the patient.  I personally confirmed the key portions of the history and exam documented by Dr. Elliot Gurney and I reviewed pertinent patient test results.  The assessment, diagnosis, and plan were formulated together and I agree with the documentation in the residents note. Patient currently followed by allergy for environmental/seasonal allergies and undergoing allergy shots. Will assist with paperwork for carpet removal as this environmental change may make a significant difference in ongoing symptoms. Per patient, previously failed trial of montelukast due to nightmares. Will trial alternative antihistamine and allergy eye drops to hopefully help with symptoms while continuing allergy shots. Encouraged return to established ENT for chronic sinusitis evaluation as well as continued follow up with allergy.

## 2022-01-06 ENCOUNTER — Other Ambulatory Visit (HOSPITAL_COMMUNITY): Payer: Self-pay

## 2022-01-06 DIAGNOSIS — J3089 Other allergic rhinitis: Secondary | ICD-10-CM | POA: Diagnosis not present

## 2022-01-06 DIAGNOSIS — Z7689 Persons encountering health services in other specified circumstances: Secondary | ICD-10-CM | POA: Diagnosis not present

## 2022-01-06 DIAGNOSIS — J301 Allergic rhinitis due to pollen: Secondary | ICD-10-CM | POA: Diagnosis not present

## 2022-01-11 DIAGNOSIS — J3089 Other allergic rhinitis: Secondary | ICD-10-CM | POA: Diagnosis not present

## 2022-01-11 DIAGNOSIS — J301 Allergic rhinitis due to pollen: Secondary | ICD-10-CM | POA: Diagnosis not present

## 2022-01-13 DIAGNOSIS — Z7689 Persons encountering health services in other specified circumstances: Secondary | ICD-10-CM | POA: Diagnosis not present

## 2022-01-13 DIAGNOSIS — J3089 Other allergic rhinitis: Secondary | ICD-10-CM | POA: Diagnosis not present

## 2022-01-13 DIAGNOSIS — J301 Allergic rhinitis due to pollen: Secondary | ICD-10-CM | POA: Diagnosis not present

## 2022-01-16 DIAGNOSIS — Z7689 Persons encountering health services in other specified circumstances: Secondary | ICD-10-CM | POA: Diagnosis not present

## 2022-01-16 DIAGNOSIS — J309 Allergic rhinitis, unspecified: Secondary | ICD-10-CM | POA: Diagnosis not present

## 2022-01-18 DIAGNOSIS — Z419 Encounter for procedure for purposes other than remedying health state, unspecified: Secondary | ICD-10-CM | POA: Diagnosis not present

## 2022-01-19 ENCOUNTER — Ambulatory Visit: Payer: Medicaid Other

## 2022-01-20 DIAGNOSIS — Z7689 Persons encountering health services in other specified circumstances: Secondary | ICD-10-CM | POA: Diagnosis not present

## 2022-01-20 DIAGNOSIS — J3089 Other allergic rhinitis: Secondary | ICD-10-CM | POA: Diagnosis not present

## 2022-01-20 DIAGNOSIS — J301 Allergic rhinitis due to pollen: Secondary | ICD-10-CM | POA: Diagnosis not present

## 2022-01-25 ENCOUNTER — Telehealth: Payer: Self-pay

## 2022-01-25 ENCOUNTER — Other Ambulatory Visit: Payer: Self-pay | Admitting: Internal Medicine

## 2022-01-25 DIAGNOSIS — I10 Essential (primary) hypertension: Secondary | ICD-10-CM

## 2022-01-25 NOTE — Telephone Encounter (Signed)
Refill Request  albuterol (VENTOLIN HFA) 108 (90 Base) MCG/ACT inhaler amLODipine (NORVASC) 10 MG tablet  [id number] - [place], [place] - [address] AT [facility]

## 2022-01-25 NOTE — Telephone Encounter (Signed)
Patient called office today regarding covid booster. Patient is aware that office only has [place] booster. Would like to talk to Dr. Baxter Flattery before getting vaccine. Leatrice Jewels, RMA

## 2022-01-25 NOTE — Telephone Encounter (Signed)
Pt called / informed Amlodipine was refilled today and sent to [facility] on [address]. Stated she lives closer to [place] than [place].

## 2022-01-26 MED ORDER — ALBUTEROL SULFATE HFA 108 (90 BASE) MCG/ACT IN AERS: 2.0000 | INHALATION_SPRAY | Freq: Four times a day (QID) | RESPIRATORY_TRACT | 1 refills | Status: DC | PRN

## 2022-01-26 MED ORDER — AMLODIPINE BESYLATE 10 MG PO TABS: ORAL_TABLET | ORAL | 3 refills | Status: DC

## 2022-01-26 NOTE — Telephone Encounter (Signed)
Both - amlodipine and albuterol. Thanks

## 2022-01-27 DIAGNOSIS — Z7689 Persons encountering health services in other specified circumstances: Secondary | ICD-10-CM | POA: Diagnosis not present

## 2022-01-27 DIAGNOSIS — J301 Allergic rhinitis due to pollen: Secondary | ICD-10-CM | POA: Diagnosis not present

## 2022-01-27 DIAGNOSIS — J3089 Other allergic rhinitis: Secondary | ICD-10-CM | POA: Diagnosis not present

## 2022-01-30 ENCOUNTER — Other Ambulatory Visit (HOSPITAL_COMMUNITY): Payer: Self-pay

## 2022-01-30 ENCOUNTER — Other Ambulatory Visit: Payer: Self-pay

## 2022-01-30 ENCOUNTER — Ambulatory Visit (INDEPENDENT_AMBULATORY_CARE_PROVIDER_SITE_OTHER): Payer: Medicaid Other | Admitting: Internal Medicine

## 2022-01-30 VITALS — BP 167/101 | HR 104 | Temp 97.3°F | Resp 16 | Ht 64.0 in | Wt 117.0 lb

## 2022-01-30 DIAGNOSIS — R634 Abnormal weight loss: Secondary | ICD-10-CM

## 2022-01-30 DIAGNOSIS — Z7689 Persons encountering health services in other specified circumstances: Secondary | ICD-10-CM | POA: Diagnosis not present

## 2022-01-30 DIAGNOSIS — D369 Benign neoplasm, unspecified site: Secondary | ICD-10-CM | POA: Diagnosis not present

## 2022-01-30 DIAGNOSIS — I10 Essential (primary) hypertension: Secondary | ICD-10-CM

## 2022-01-30 DIAGNOSIS — B20 Human immunodeficiency virus [HIV] disease: Secondary | ICD-10-CM | POA: Diagnosis not present

## 2022-01-30 DIAGNOSIS — Z79899 Other long term (current) drug therapy: Secondary | ICD-10-CM | POA: Diagnosis not present

## 2022-01-30 DIAGNOSIS — F1190 Opioid use, unspecified, uncomplicated: Secondary | ICD-10-CM

## 2022-01-30 MED ORDER — AMLODIPINE BESYLATE 5 MG PO TABS: 5.0000 mg | ORAL_TABLET | Freq: Every day | ORAL | 0 refills | Status: DC

## 2022-01-30 NOTE — Progress Notes (Signed)
RFV: follow up for 6 month HIV visit Patient ID: Janice Williams, female   DOB: [birth year], 61 y.o.   MRN: [id number]  HPI Janice Williams is a 61yo F with well controlled hiv disease. She states that she has Lost her medications ? She said that she only received 30d instead of 90d supply. Has been out of meds and insurance not covering  Mammo scheduled next week  Outpatient Encounter Medications as of 01/30/2022  Medication Sig   albuterol (VENTOLIN HFA) 108 (90 Base) MCG/ACT inhaler Inhale 2 puffs into the lungs every 6 (six) hours as needed for wheezing or shortness of breath.   amLODipine (NORVASC) 10 MG tablet Take 1 tablet by mouth once daily   azelastine (ASTELIN) 0.1 % nasal spray 1 spray 2 (two) times daily.   benzonatate (TESSALON) 100 MG capsule Take 1 capsule (100 mg total) by mouth every 8 (eight) hours.   bictegravir-emtricitabine-tenofovir AF (BIKTARVY) 50-200-25 MG TABS tablet TAKE 1 TABLET BY MOUTH DAILY.   bictegravir-emtricitabine-tenofovir AF (BIKTARVY) 50-200-25 MG TABS tablet Take 1 tablet by mouth daily.   budesonide-formoterol (SYMBICORT) 80-4.5 MCG/ACT inhaler TAKE 2 PUFFS FIRST THING IN THE MORNING AND THEN ANOTHER 2 PUFFS 12 HOURS LATER   budesonide-formoterol (SYMBICORT) 80-4.5 MCG/ACT inhaler Inhale 2 puffs into the lungs 2 (two) times daily.   cromolyn (OPTICROM) 4 % ophthalmic solution 1 drop in the morning and at bedtime.   Ensure (ENSURE) Take 237 mLs by mouth 3 (three) times daily between meals.   fluticasone (FLONASE) 50 MCG/ACT nasal spray Place 1 spray into both nostrils daily. (Patient taking differently: Place 1 spray into both nostrils in the morning and at bedtime.)   gabapentin (NEURONTIN) 300 MG capsule Take 300 mg by mouth at bedtime.   levocetirizine (XYZAL) 5 MG tablet Take 5 mg by mouth every evening.   megestrol (MEGACE) 40 MG/ML suspension Take 10 mLs (400 mg total) by mouth daily.   neomycin-polymyxin b-dexamethasone (MAXITROL) 9.6-78938-1.0 OINT 1  application at bedtime.   omeprazole (PRILOSEC) 20 MG capsule Take 1 capsule (20 mg total) by mouth daily.   pravastatin (PRAVACHOL) 40 MG tablet Take 1 tablet (40 mg total) by mouth daily.   PRESCRIPTION MEDICATION Inject 1 Dose into the skin once a week. Allergy shot in office on Tuesdays   RESTASIS 0.05 % ophthalmic emulsion 1 drop 2 (two) times daily.   senna-docusate (SENOKOT-S) 8.6-50 MG tablet Take 1 tablet by mouth daily. May adjust to achieve 1 bowel movement daily   sorbitol 70 % solution Take 15 mLs by mouth daily as needed.   traMADol (ULTRAM) 50 MG tablet Take 1 tablet (50 mg total) by mouth every 12 (twelve) hours as needed.   cefdinir (OMNICEF) 300 MG capsule Take 1 capsule (300 mg total) by mouth 2 (two) times daily. (Patient not taking: Reported on 12/12/2021)   doxycycline (VIBRAMYCIN) 100 MG capsule Take 1 capsule (100 mg total) by mouth 2 (two) times daily. (Patient not taking: Reported on 01/30/2022)   No facility-administered encounter medications on file as of 01/30/2022.     Patient Active Problem List   Diagnosis Date Noted   Need for shingles vaccine 08/14/2021   Constipation 05/12/2021   Cough 03/30/2021   Screening for cervical cancer 03/18/2021   Ear pain, bilateral 03/17/2021   Degenerative disc disease, cervical 10/13/2020   Chronic, continuous use of opioids 09/10/2020   Rhinitis, chronic 06/10/2020   Restless legs syndrome (RLS) 01/08/2020   Osteoporosis 10/10/2019   COPD  GOLD 3  07/08/2019   Moderate protein-calorie malnutrition ([place]) 05/31/2018   Hypertension 06/08/2017   Left knee pain 03/15/2015   Healthcare maintenance 07/06/2011   Hyperlipidemia 06/03/2009   WEIGHT LOSS 06/03/2009   Human immunodeficiency virus (HIV) disease ([place]) 10/29/2008   GERD 10/29/2008   CEREBROVASCULAR ACCIDENT, HX OF 10/29/2008   TOBACCO DEPENDENCE 02/14/2007   Allergic rhinitis 02/14/2007   Symptoms concerning nutrition, metabolism, and development 02/14/2007      Health Maintenance Due  Topic Date Due   Zoster Vaccines- Shingrix (1 of 2) Never done   COVID-19 Vaccine (4 - Booster for Moderna series) 09/29/2020   MAMMOGRAM  10/05/2021   COLONOSCOPY (Pts 45-10yrs Insurance coverage will need to be confirmed)  11/21/2021     Review of Systems Review of Systems  Constitutional: Negative for fever, chills, diaphoresis, activity change, appetite change, fatigue and unexpected weight change.  HENT: Negative for congestion, sore throat, rhinorrhea, sneezing, trouble swallowing and sinus pressure.  Eyes: Negative for photophobia and visual disturbance.  Respiratory: Negative for cough, chest tightness, shortness of breath, wheezing and stridor.  Cardiovascular: Negative for chest pain, palpitations and leg swelling.  Gastrointestinal: Negative for nausea, vomiting, abdominal pain, diarrhea, constipation, blood in stool, abdominal distention and anal bleeding.  Genitourinary: Negative for dysuria, hematuria, flank pain and difficulty urinating.  Musculoskeletal: Negative for myalgias, back pain, joint swelling, arthralgias and gait problem.  Skin: Negative for color change, pallor, rash and wound.  Neurological: Negative for dizziness, tremors, weakness and light-headedness.  Hematological: Negative for adenopathy. Does not bruise/bleed easily.  Psychiatric/Behavioral: Negative for behavioral problems, confusion, sleep disturbance, dysphoric mood, decreased concentration and agitation.   Physical Exam   BP (!) 167/101   Pulse (!) 104   Temp (!) 97.3 F (36.3 C)   Resp 16   Ht 5\' 4"  (1.626 m)   Wt 117 lb (53.1 kg)   SpO2 99%   BMI 20.08 kg/m   Physical Exam  Constitutional:  oriented to person, place, and time. appears well-developed and well-nourished. No distress.  HENT: [place]/AT, PERRLA, no scleral icterus Mouth/Throat: Oropharynx is clear and moist. No oropharyngeal exudate.  Cardiovascular: Normal rate, regular rhythm and normal heart  sounds. Exam reveals no gallop and no friction rub.  No murmur heard.  Pulmonary/Chest: Effort normal and breath sounds normal. No respiratory distress.  has no wheezes.  Neck = supple, no nuchal rigidity Abdominal: Soft. Bowel sounds are normal.  exhibits no distension. There is no tenderness.  Lymphadenopathy: no cervical adenopathy. No axillary adenopathy Neurological: alert and oriented to person, place, and time.  Skin: Skin is warm and dry. No rash noted. No erythema.  Psychiatric: a normal mood and affect.  behavior is normal.   Lab Results  Component Value Date   CD4TCELL 39 08/01/2021   Lab Results  Component Value Date   CD4TABS 899 08/01/2021   CD4TABS 682 03/07/2021   CD4TABS 886 09/22/2020   Lab Results  Component Value Date   HIV1RNAQUANT <20 (H) 08/01/2021   Lab Results  Component Value Date   HEPBSAB NEG 12/28/2014   Lab Results  Component Value Date   LABRPR NON-REACTIVE 08/01/2021    CBC Lab Results  Component Value Date   WBC 7.8 12/08/2021   RBC 3.59 (L) 12/08/2021   HGB 12.2 12/08/2021   HCT 36.1 12/08/2021   PLT 291 12/08/2021   MCV 100.6 (H) 12/08/2021   MCH 34.0 12/08/2021   MCHC 33.8 12/08/2021   RDW 13.4 12/08/2021  LYMPHSABS 2.4 12/08/2021   MONOABS 1.0 12/08/2021   EOSABS 0.0 12/08/2021    BMET Lab Results  Component Value Date   NA 137 12/08/2021   K 3.8 12/08/2021   CL 106 12/08/2021   CO2 22 12/08/2021   GLUCOSE 97 12/08/2021   BUN 13 12/08/2021   CREATININE 1.03 (H) 12/08/2021   CALCIUM 9.2 12/08/2021   GFRNONAA >60 12/08/2021   GFRAA 65 03/07/2021      Assessment and Plan  Hiv disease- labs  Htn = unclear why she is unable to get refills due to lost drug policy. Will give her 30 day of 5mg  of amlodipine to take  2 daily, then also gave her rx for good rx ($5 OOP cost for 1 month) -then this should bridge her until her usual insurance coverage kicks in.  Health maintenance: Hx of tubular adenoma - due for a  repeat colonoscopy on 12/22- will send in referral Covid booster - she is due for bivalent but would like to receive moderna booster

## 2022-01-30 NOTE — Telephone Encounter (Signed)
traMADol (ULTRAM) 50 MG tablet, refill request.

## 2022-01-31 ENCOUNTER — Other Ambulatory Visit: Payer: Self-pay | Admitting: Internal Medicine

## 2022-01-31 DIAGNOSIS — F1190 Opioid use, unspecified, uncomplicated: Secondary | ICD-10-CM

## 2022-01-31 MED ORDER — TRAMADOL HCL 50 MG PO TABS: 50.0000 mg | ORAL_TABLET | Freq: Two times a day (BID) | ORAL | 2 refills | Status: DC | PRN

## 2022-02-01 ENCOUNTER — Other Ambulatory Visit (HOSPITAL_COMMUNITY): Payer: Self-pay

## 2022-02-01 LAB — COMPLETE METABOLIC PANEL WITH GFR
AG Ratio: 1.3 (calc) (ref 1.0–2.5)
ALT: 6 U/L (ref 6–29)
AST: 14 U/L (ref 10–35)
Albumin: 4.3 g/dL (ref 3.6–5.1)
Alkaline phosphatase (APISO): 53 U/L (ref 37–153)
BUN/Creatinine Ratio: 11 (calc) (ref 6–22)
BUN: 13 mg/dL (ref 7–25)
CO2: 25 mmol/L (ref 20–32)
Calcium: 9.7 mg/dL (ref 8.6–10.4)
Chloride: 102 mmol/L (ref 98–110)
Creat: 1.22 mg/dL — ABNORMAL HIGH (ref 0.50–1.05)
Globulin: 3.2 g/dL (calc) (ref 1.9–3.7)
Glucose, Bld: 89 mg/dL (ref 65–99)
Potassium: 4 mmol/L (ref 3.5–5.3)
Sodium: 137 mmol/L (ref 135–146)
Total Bilirubin: 0.7 mg/dL (ref 0.2–1.2)
Total Protein: 7.5 g/dL (ref 6.1–8.1)
eGFR: 50 mL/min/{1.73_m2} — ABNORMAL LOW (ref >=60)

## 2022-02-01 LAB — CBC WITH DIFFERENTIAL/PLATELET
Absolute Monocytes: 521 cells/uL (ref 200–950)
Basophils Absolute: 19 cells/uL (ref 0–200)
Basophils Relative: 0.3 %
Eosinophils Absolute: 37 cells/uL (ref 15–500)
Eosinophils Relative: 0.6 %
HCT: 34.4 % — ABNORMAL LOW (ref 35.0–45.0)
Hemoglobin: 11.7 g/dL (ref 11.7–15.5)
Lymphs Abs: 2009 cells/uL (ref 850–3900)
MCH: 33.1 pg — ABNORMAL HIGH (ref 27.0–33.0)
MCHC: 34 g/dL (ref 32.0–36.0)
MCV: 97.2 fL (ref 80.0–100.0)
MPV: 9.5 fL (ref 7.5–12.5)
Monocytes Relative: 8.4 %
Neutro Abs: 3615 cells/uL (ref 1500–7800)
Neutrophils Relative %: 58.3 %
Platelets: 351 10*3/uL (ref 140–400)
RBC: 3.54 10*6/uL — ABNORMAL LOW (ref 3.80–5.10)
RDW: 12.5 % (ref 11.0–15.0)
Total Lymphocyte: 32.4 %
WBC: 6.2 10*3/uL (ref 3.8–10.8)

## 2022-02-01 LAB — T-HELPER CELLS (CD4) COUNT (NOT AT ARMC)
Absolute CD4: 903 cells/uL (ref 490–1740)
CD4 T Helper %: 44 % (ref 30–61)
Total lymphocyte count: 2055 cells/uL (ref 850–3900)

## 2022-02-01 LAB — HIV-1 RNA QUANT-NO REFLEX-BLD
HIV 1 RNA Quant: 36 Copies/mL — ABNORMAL HIGH
HIV-1 RNA Quant, Log: 1.56 Log cps/mL — ABNORMAL HIGH

## 2022-02-01 LAB — RPR: RPR Ser Ql: NONREACTIVE

## 2022-02-01 NOTE — Telephone Encounter (Signed)
Per pt, the pharmacy is requesting another physician to send in traMADol (ULTRAM) 50 MG tablet, insurance will not accept Dr. Court Joy name.

## 2022-02-02 ENCOUNTER — Ambulatory Visit: Payer: Medicaid Other

## 2022-02-03 ENCOUNTER — Other Ambulatory Visit (HOSPITAL_COMMUNITY): Payer: Self-pay

## 2022-02-06 ENCOUNTER — Telehealth: Payer: Self-pay | Admitting: *Deleted

## 2022-02-06 MED ORDER — TRAMADOL HCL 50 MG PO TABS: 50.0000 mg | ORAL_TABLET | Freq: Four times a day (QID) | ORAL | 0 refills | Status: DC | PRN

## 2022-02-06 NOTE — Telephone Encounter (Signed)
Call from patient unable to get prescription for Tramadol 50 mg.  With patient's insurance can only get 7 days worth the first fill.  Patient will ned new prescription written for the Tramadol.  Spoke to [employer] who saud that it is now a law that with first prescription you will only receive a 1 day supply.  The rest of the previous prescription was voided.  Needs new prescription sent to [facility]: Janice Williams on [address].

## 2022-02-09 ENCOUNTER — Other Ambulatory Visit: Payer: Self-pay | Admitting: Internal Medicine

## 2022-02-10 DIAGNOSIS — J3089 Other allergic rhinitis: Secondary | ICD-10-CM | POA: Diagnosis not present

## 2022-02-10 DIAGNOSIS — Z7689 Persons encountering health services in other specified circumstances: Secondary | ICD-10-CM | POA: Diagnosis not present

## 2022-02-10 DIAGNOSIS — J301 Allergic rhinitis due to pollen: Secondary | ICD-10-CM | POA: Diagnosis not present

## 2022-02-14 ENCOUNTER — Encounter: Payer: Self-pay | Admitting: Gastroenterology

## 2022-02-15 DIAGNOSIS — Z419 Encounter for procedure for purposes other than remedying health state, unspecified: Secondary | ICD-10-CM | POA: Diagnosis not present

## 2022-02-17 DIAGNOSIS — J3089 Other allergic rhinitis: Secondary | ICD-10-CM | POA: Diagnosis not present

## 2022-02-17 DIAGNOSIS — J301 Allergic rhinitis due to pollen: Secondary | ICD-10-CM | POA: Diagnosis not present

## 2022-02-17 DIAGNOSIS — Z7689 Persons encountering health services in other specified circumstances: Secondary | ICD-10-CM | POA: Diagnosis not present

## 2022-02-20 ENCOUNTER — Encounter: Payer: Self-pay | Admitting: Gastroenterology

## 2022-02-21 ENCOUNTER — Encounter: Payer: Self-pay | Admitting: Gastroenterology

## 2022-02-21 ENCOUNTER — Ambulatory Visit
Admission: RE | Admit: 2022-02-21 | Discharge: 2022-02-21 | Disposition: A | Discharge status: Home / Self Care | Payer: Medicaid Other | Source: Ambulatory Visit | Attending: Internal Medicine | Admitting: Internal Medicine

## 2022-02-21 DIAGNOSIS — Z1231 Encounter for screening mammogram for malignant neoplasm of breast: Secondary | ICD-10-CM

## 2022-02-21 DIAGNOSIS — Z7689 Persons encountering health services in other specified circumstances: Secondary | ICD-10-CM | POA: Diagnosis not present

## 2022-02-21 IMAGING — MG MM DIGITAL SCREENING BILAT W/ TOMO AND CAD
6 of 10 series · 6 of 30 positions shown · non-contrast
Comparison: Previous exam(s).

CLINICAL DATA: Screening.

EXAM:
DIGITAL SCREENING BILATERAL MAMMOGRAM WITH TOMOSYNTHESIS AND CAD
TECHNIQUE: Bilateral screening digital craniocaudal and mediolateral oblique
mammograms were obtained. Bilateral screening digital breast
tomosynthesis was performed. The images were evaluated with
computer-aided detection.

[L MLO synth-2D]
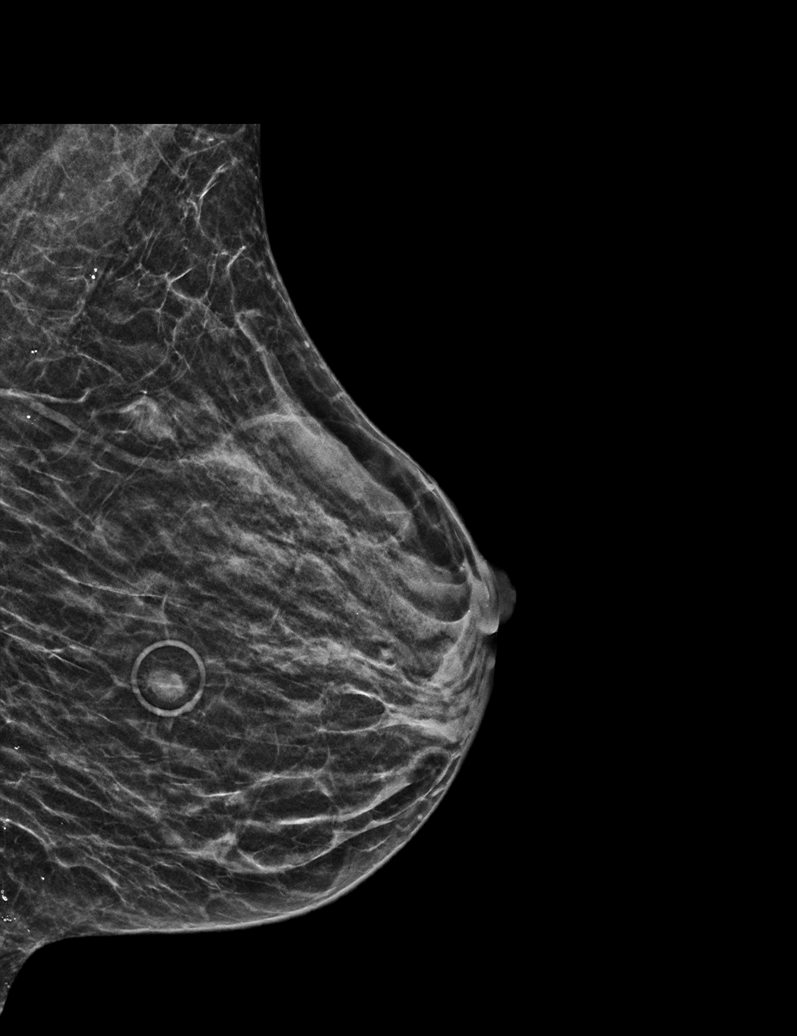

[L CC synth-2D (1 of 2)]
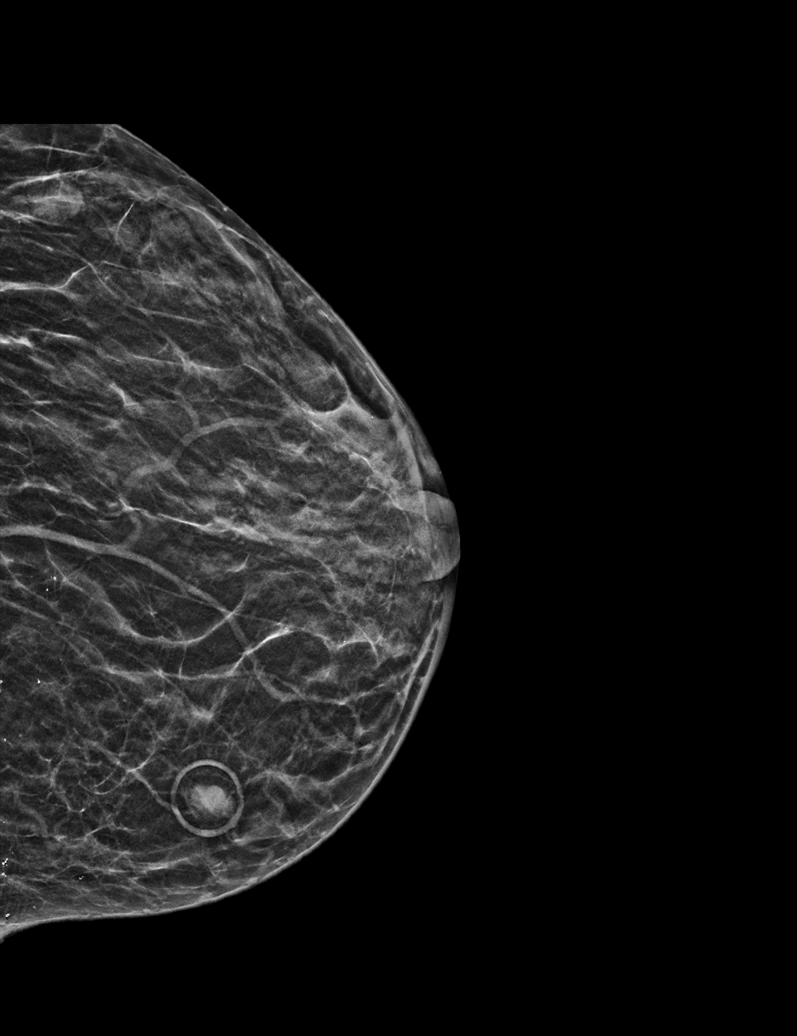

[R CC synth-2D]
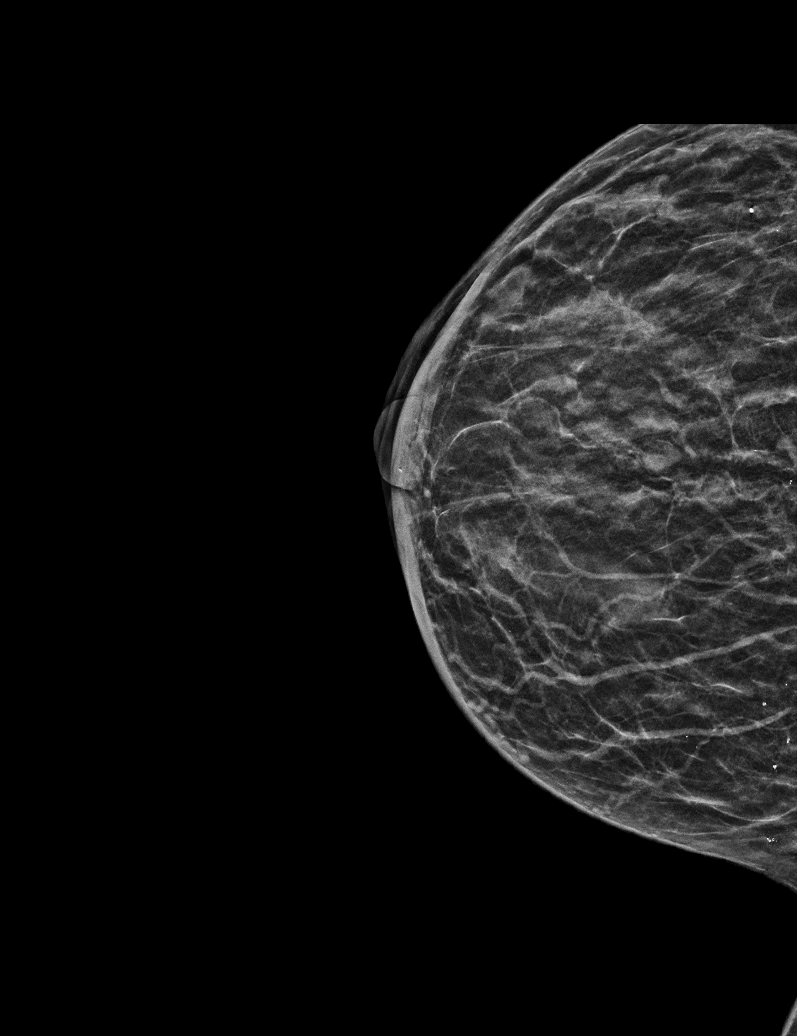

[R MLO synth-2D]
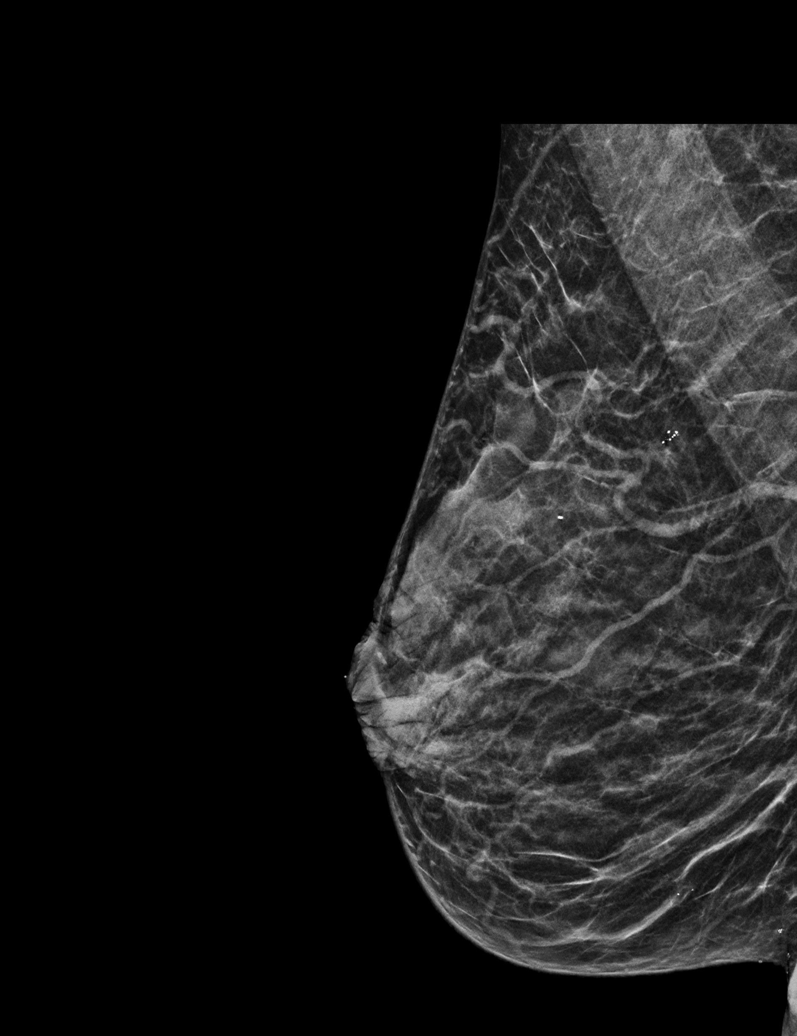

[L CC synth-2D (2 of 2)]
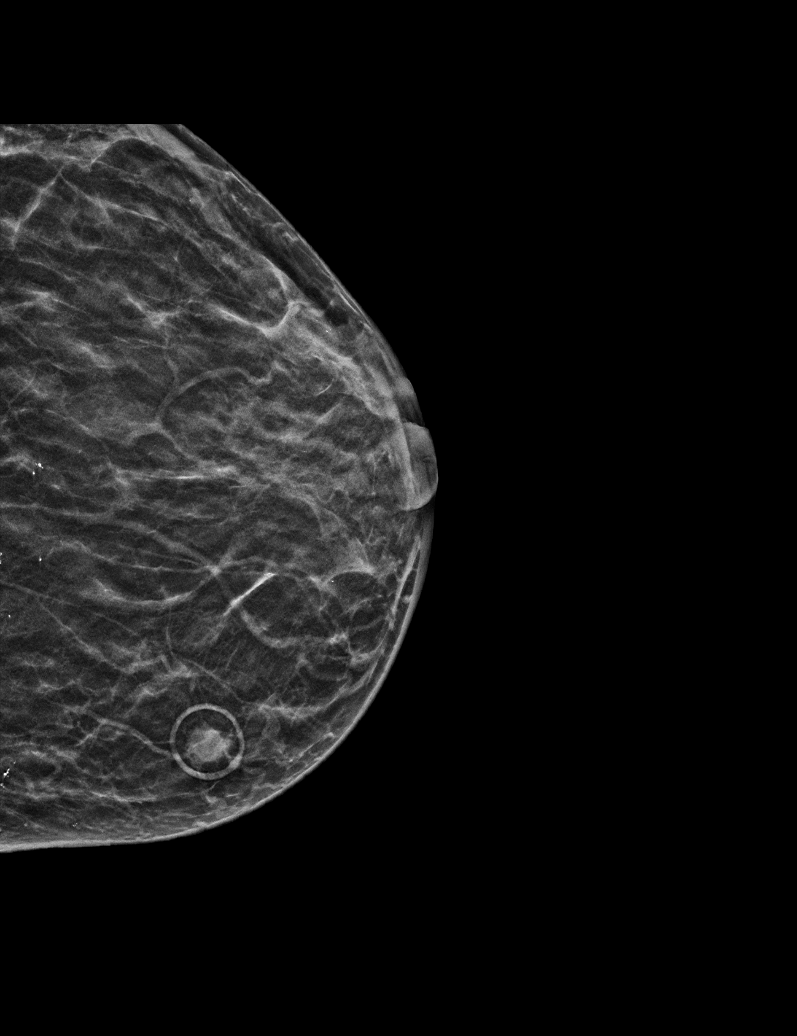

[L CC tomo · tomo slice 14/27.0]
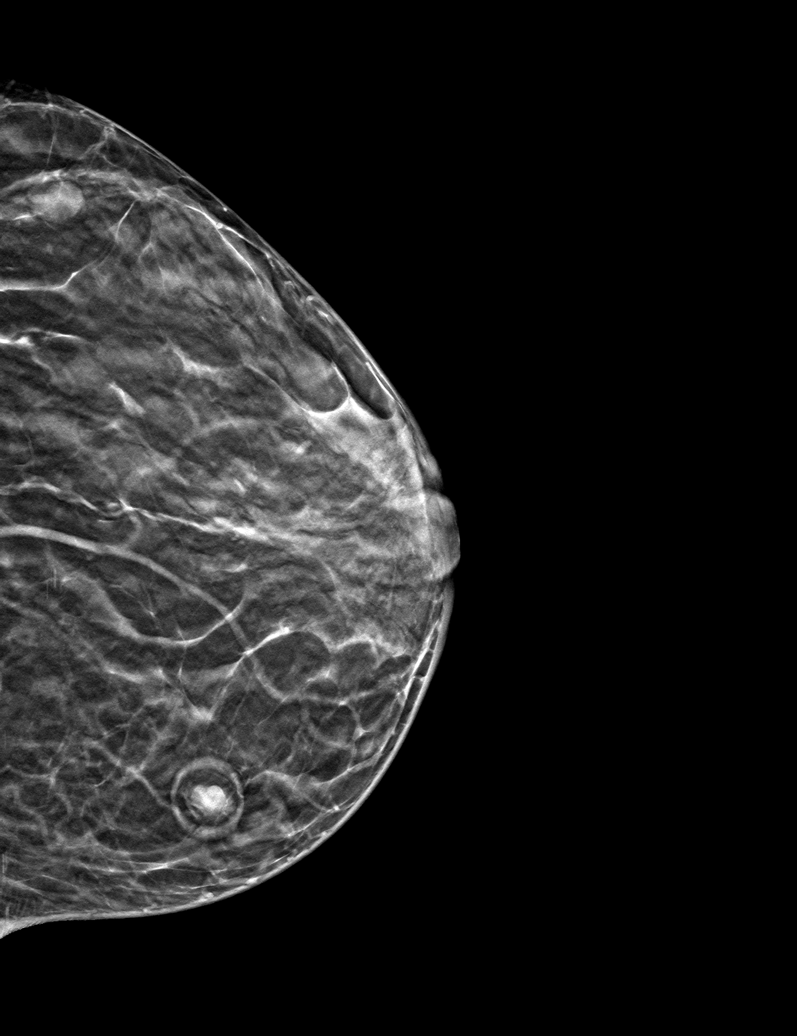

[6 of 30 positions shown; findings below may reference images not displayed]

ACR Breast Density Category c: The breast tissue is heterogeneously
dense, which may obscure small masses.
FINDINGS: There are no findings suspicious for malignancy.
IMPRESSION: No mammographic evidence of malignancy. A result letter of this
screening mammogram will be mailed directly to the patient.

RECOMMENDATION:
Screening mammogram in one year. (Code:[V2])

BI-RADS CATEGORY  1: Negative.

## 2022-02-24 DIAGNOSIS — J301 Allergic rhinitis due to pollen: Secondary | ICD-10-CM | POA: Diagnosis not present

## 2022-02-24 DIAGNOSIS — J3089 Other allergic rhinitis: Secondary | ICD-10-CM | POA: Diagnosis not present

## 2022-02-27 ENCOUNTER — Other Ambulatory Visit: Payer: Self-pay

## 2022-02-27 ENCOUNTER — Ambulatory Visit (INDEPENDENT_AMBULATORY_CARE_PROVIDER_SITE_OTHER): Payer: Medicaid Other

## 2022-02-27 ENCOUNTER — Ambulatory Visit
Admission: EM | Admit: 2022-02-27 | Discharge: 2022-02-27 | Disposition: A | Discharge status: Home / Self Care | Payer: Medicaid Other | Attending: Physician Assistant | Admitting: Physician Assistant

## 2022-02-27 DIAGNOSIS — M25571 Pain in right ankle and joints of right foot: Secondary | ICD-10-CM

## 2022-02-27 DIAGNOSIS — M7989 Other specified soft tissue disorders: Secondary | ICD-10-CM | POA: Diagnosis not present

## 2022-02-27 DIAGNOSIS — M1990 Unspecified osteoarthritis, unspecified site: Secondary | ICD-10-CM | POA: Diagnosis not present

## 2022-02-27 MED ORDER — METHYLPREDNISOLONE 4 MG PO TBPK: ORAL_TABLET | ORAL | 0 refills | Status: DC

## 2022-02-27 MED ORDER — PREDNISONE 20 MG PO TABS: 40.0000 mg | ORAL_TABLET | Freq: Every day | ORAL | 0 refills | Status: DC

## 2022-02-27 NOTE — ED Provider Notes (Signed)
[facility]    CSN: [id number] Arrival date & time: 02/27/22  0848      History   Chief Complaint Chief Complaint  Patient presents with   Ankle Pain    right    HPI Janice Williams is a 61 y.o. female.   Patient here today for evaluation of right ankle pain and swelling that started yesterday while she was sitting down. She denies any known injury or falls. She has been using tylenol without relief. She reports weight bearing worsens symptoms.    The history is provided by the patient.  Ankle Pain Associated symptoms: no fever    Past Medical History:  Diagnosis Date   Allergy    Anemia    Arthritis    lower back, knees   Bronchitis    COPD (chronic obstructive pulmonary disease) (HCC)    COPD with acute exacerbation ([place]) 06/09/2017   Dental abscess 03/15/2015   DYSPEPSIA 02/14/2007   Qualifier: Diagnosis of  By: Samara Snide     EXTERNAL OTITIS 01/06/2010   Qualifier: Diagnosis of  By: Tomma Lightning MD, Claiborne Billings     FACIAL RASH 02/04/2009   Qualifier: Diagnosis of  By: Rhunette Croft     Former smoker    quit 2014   GERD (gastroesophageal reflux disease)    HIV infection ([place])    Hypertension    Stroke ([place])    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, clean 7 years   SVD (spontaneous vaginal delivery)    x 3    Patient Active Problem List   Diagnosis Date Noted   Need for shingles vaccine 08/14/2021   Constipation 05/12/2021   Cough 03/30/2021   Screening for cervical cancer 03/18/2021   Ear pain, bilateral 03/17/2021   Degenerative disc disease, cervical 10/13/2020   Chronic, continuous use of opioids 09/10/2020   Rhinitis, chronic 06/10/2020   Restless legs syndrome (RLS) 01/08/2020   Osteoporosis 10/10/2019   COPD  GOLD 3  07/08/2019   Moderate protein-calorie malnutrition ([place]) 05/31/2018   Hypertension 06/08/2017   Left knee pain 03/15/2015   Healthcare maintenance 07/06/2011   Hyperlipidemia 06/03/2009   WEIGHT LOSS 06/03/2009   Human  immunodeficiency virus (HIV) disease ([place]) 10/29/2008   GERD 10/29/2008   CEREBROVASCULAR ACCIDENT, HX OF 10/29/2008   TOBACCO DEPENDENCE 02/14/2007   Allergic rhinitis 02/14/2007   Symptoms concerning nutrition, metabolism, and development 02/14/2007    Past Surgical History:  Procedure Laterality Date   MULTIPLE TOOTH EXTRACTIONS     with sedation   TUBAL LIGATION     UPPER GI ENDOSCOPY     normal per patient - "years ago"    OB History     Gravida  3   Para  3   Term  3   Preterm      AB      Living  3      SAB      IAB      Ectopic      Multiple      Live Births  3            Home Medications    Prior to Admission medications   Medication Sig Start Date End Date Taking? Authorizing Provider  methylPREDNISolone (MEDROL DOSEPAK) 4 MG TBPK tablet Take 6 tabs PO day one, 5 tabs day 2, 4 tabs day 3, 3 tabs day 4, 2 tabs day 5, 1 tab day 6 02/27/22  Yes Francene Finders, PA-C  albuterol (VENTOLIN HFA) 108 (90 Base) MCG/ACT inhaler Inhale 2 puffs into the lungs every 6 (six) hours as needed for wheezing or shortness of breath. 01/26/22   Madalyn Rob, MD  amLODipine (NORVASC) 10 MG tablet Take 1 tablet by mouth once daily 01/26/22   Madalyn Rob, MD  amLODipine (NORVASC) 5 MG tablet Take 1 tablet (5 mg total) by mouth daily. 01/30/22   Carlyle Basques, MD  azelastine (ASTELIN) 0.1 % nasal spray 1 spray 2 (two) times daily. 08/15/21   [provider]  benzonatate (TESSALON) 100 MG capsule Take 1 capsule (100 mg total) by mouth every 8 (eight) hours. 12/08/21   Roemhildt, Lorin T, PA-C  bictegravir-emtricitabine-tenofovir AF (BIKTARVY) 50-200-25 MG TABS tablet TAKE 1 TABLET BY MOUTH DAILY. 09/02/21   Carlyle Basques, MD  bictegravir-emtricitabine-tenofovir AF (BIKTARVY) 50-200-25 MG TABS tablet Take 1 tablet by mouth daily. 09/26/21   Golden Circle, FNP  budesonide-formoterol (SYMBICORT) 80-4.5 MCG/ACT inhaler TAKE 2 PUFFS FIRST THING IN THE MORNING AND  THEN ANOTHER 2 PUFFS 12 HOURS LATER 03/14/21   Tanda Rockers, MD  budesonide-formoterol [facility]) 80-4.5 MCG/ACT inhaler Inhale 2 puffs into the lungs 2 (two) times daily. 07/13/21   Tanda Rockers, MD  cefdinir (OMNICEF) 300 MG capsule Take 1 capsule (300 mg total) by mouth 2 (two) times daily. Patient not taking: Reported on 12/12/2021 11/24/21   Tanda Rockers, MD  cromolyn (OPTICROM) 4 % ophthalmic solution 1 drop in the morning and at bedtime.    [provider]  doxycycline (VIBRAMYCIN) 100 MG capsule Take 1 capsule (100 mg total) by mouth 2 (two) times daily. Patient not taking: Reported on 01/30/2022 12/12/21   Roemhildt, Lorin T, PA-C  Ensure (ENSURE) Take 237 mLs by mouth 3 (three) times daily between meals. 10/28/21   Carlyle Basques, MD  fluticasone (FLONASE) 50 MCG/ACT nasal spray Place 1 spray into both nostrils daily. Patient taking differently: Place 1 spray into both nostrils in the morning and at bedtime. 08/11/21   Madalyn Rob, MD  gabapentin (NEURONTIN) 300 MG capsule Take 300 mg by mouth at bedtime. 10/24/21   [provider]  levocetirizine (XYZAL) 5 MG tablet Take 5 mg by mouth every evening.    [provider]  megestrol (MEGACE) 40 MG/ML suspension Take 10 mLs (400 mg total) by mouth daily. 11/01/21   Madalyn Rob, MD  neomycin-polymyxin b-dexamethasone (MAXITROL) 5.6-97948-0.1 OINT 1 application at bedtime. 12/06/21   [provider]  omeprazole (PRILOSEC) 20 MG capsule Take 1 capsule (20 mg total) by mouth daily. 11/16/20   Masoudi, Dorthula Rue, MD  pravastatin (PRAVACHOL) 40 MG tablet Take 1 tablet (40 mg total) by mouth daily. 08/11/21   Madalyn Rob, MD  PRESCRIPTION MEDICATION Inject 1 Dose into the skin once a week. Allergy shot in office on Tuesdays    [provider]  RESTASIS 0.05 % ophthalmic emulsion 1 drop 2 (two) times daily. 08/05/21   [provider]  senna-docusate (SENOKOT-S) 8.6-50 MG tablet Take 1  tablet by mouth daily. May adjust to achieve 1 bowel movement daily 05/12/21   Andrew Au, MD  sorbitol 70 % solution Take 15 mLs by mouth daily as needed. 10/14/21   Madalyn Rob, MD  traMADol (ULTRAM) 50 MG tablet Take 1 tablet (50 mg total) by mouth every 6 (six) hours as needed. 02/06/22 03/08/22  Dorethea Clan, DO    Family History Family History  Problem Relation Age of Onset   Hypertension Mother  Hypertension Father    Hyperlipidemia Father    COPD Father    Atrial fibrillation Father    Colon cancer Neg Hx    Rectal cancer Neg Hx    Stomach cancer Neg Hx     Social History Social History   Tobacco Use   Smoking status: Former    Packs/day: 0.40    Years: 38.00    Pack years: 15.20    Types: Cigarettes    Start date: 12/18/2012    Quit date: 11/21/2013    Years since quitting: 8.2   Smokeless tobacco: Never  Vaping Use   Vaping Use: Never used  Substance Use Topics   Alcohol use: Not Currently   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     Allergies   Chantix [varenicline]   Review of Systems Review of Systems  Constitutional:  Negative for chills and fever.  Eyes:  Negative for discharge and redness.  Gastrointestinal:  Negative for abdominal pain, nausea and vomiting.  Musculoskeletal:  Positive for arthralgias and joint swelling.  Neurological:  Negative for numbness.    Physical Exam Triage Vital Signs ED Triage Vitals  Enc Vitals Group     BP 02/27/22 0920 (!) 145/87     Pulse Rate 02/27/22 0920 96     Resp 02/27/22 0920 20     Temp 02/27/22 0920 98.6 F (37 C)     Temp Source 02/27/22 0920 Oral     SpO2 02/27/22 0920 96 %     Weight --      Height --      Head Circumference --      Peak Flow --      Pain Score 02/27/22 0921 9     Pain Loc --      Pain Edu? --      Excl. in [place]? --    No data found.  Updated Vital Signs BP (!) 145/87 (BP Location: Left Arm)    Pulse 96    Temp 98.6 F (37 C) (Oral)    Resp 20    SpO2 96%       Physical Exam Vitals and nursing note reviewed.  Constitutional:      General: She is not in acute distress.    Appearance: Normal appearance. She is not ill-appearing.  HENT:     Head: Normocephalic and atraumatic.  Eyes:     Conjunctiva/sclera: Conjunctivae normal.  Cardiovascular:     Rate and Rhythm: Normal rate.  Pulmonary:     Effort: Pulmonary effort is normal.  Musculoskeletal:     Comments: Diffuse swelling noted to right lateral malleolus, Decreased ROM of right ankle in all directions due to pain  Skin:    Capillary Refill: Normal cap refill to right toes Neurological:     Mental Status: She is alert.     Comments: Gross sensation intact to distal right toes  Psychiatric:        Mood and Affect: Mood normal.        Behavior: Behavior normal.        Thought Content: Thought content normal.     UC Treatments / Results  Labs (all labs ordered are listed, but only abnormal results are displayed) Labs Reviewed - No data to display  EKG   Radiology DG Ankle Complete Right  Result Date: 02/27/2022 CLINICAL DATA:  Right ankle pain EXAM: RIGHT ANKLE - COMPLETE 3+ VIEW COMPARISON:  Ankle radiograph 04/29/2015 FINDINGS:  There is no evidence of acute fracture or dislocation. There is soft tissue swelling more prominent laterally. IMPRESSION: Right ankle soft tissue swelling.  No acute osseous abnormality. Electronically Signed   By: Maurine Simmering M.D.   On: 02/27/2022 09:40    Procedures Procedures (including critical care time)  Medications Ordered in UC Medications - No data to display  Initial Impression / Assessment and Plan / UC Course  I have reviewed the triage vital signs and the nursing notes.  Pertinent labs & imaging results that were available during my care of the patient were reviewed by me and considered in my medical decision making (see chart for details).    Xray with soft tissue swelling only. Initially prescribed prednisone but patient  reports she does not tolerate well as it makes her "shake" she does report she has done well with methylprednisolone in the past, so same sent to pharmacy. Recommended follow up if symptoms fail to improve or worsen in any way.   Final Clinical Impressions(s) / UC Diagnoses   Final diagnoses:  Acute right ankle pain   Discharge Instructions   None    ED Prescriptions     Medication Sig Dispense Auth. Provider   predniSONE (DELTASONE) 20 MG tablet  (Status: Discontinued) Take 2 tablets (40 mg total) by mouth daily with breakfast for 5 days. 10 tablet Ewell Poe F, PA-C   methylPREDNISolone (MEDROL DOSEPAK) 4 MG TBPK tablet Take 6 tabs PO day one, 5 tabs day 2, 4 tabs day 3, 3 tabs day 4, 2 tabs day 5, 1 tab day 6 1 each Francene Finders, PA-C      PDMP not reviewed this encounter.   Francene Finders, PA-C 02/27/22 1042

## 2022-02-27 NOTE — ED Triage Notes (Signed)
Onset yesterday of right ankle pain and swelling that started while patient was sitting down. Has been taking tylenol w/o relief. Ambulating aggravates sxs. No falls or injuries.  ?

## 2022-02-28 ENCOUNTER — Ambulatory Visit (INDEPENDENT_AMBULATORY_CARE_PROVIDER_SITE_OTHER): Payer: Medicaid Other | Admitting: Internal Medicine

## 2022-02-28 ENCOUNTER — Encounter: Payer: Self-pay | Admitting: Internal Medicine

## 2022-02-28 ENCOUNTER — Other Ambulatory Visit (HOSPITAL_COMMUNITY): Payer: Self-pay

## 2022-02-28 ENCOUNTER — Other Ambulatory Visit: Payer: Self-pay

## 2022-02-28 DIAGNOSIS — S99911D Unspecified injury of right ankle, subsequent encounter: Secondary | ICD-10-CM

## 2022-02-28 DIAGNOSIS — H9203 Otalgia, bilateral: Secondary | ICD-10-CM

## 2022-02-28 DIAGNOSIS — M1990 Unspecified osteoarthritis, unspecified site: Secondary | ICD-10-CM | POA: Diagnosis not present

## 2022-02-28 DIAGNOSIS — S93419A Sprain of calcaneofibular ligament of unspecified ankle, initial encounter: Secondary | ICD-10-CM | POA: Insufficient documentation

## 2022-02-28 DIAGNOSIS — S99911A Unspecified injury of right ankle, initial encounter: Secondary | ICD-10-CM | POA: Insufficient documentation

## 2022-02-28 DIAGNOSIS — Z7689 Persons encountering health services in other specified circumstances: Secondary | ICD-10-CM | POA: Diagnosis not present

## 2022-02-28 NOTE — Assessment & Plan Note (Signed)
The patient endorses a remote injury to the right ankle.  At that time, she was unable to walk on her ankle due to the pain.  She was seen in the ED yesterday because the patient noticed that her ankle became acutely more swollen.  Right ankle films were obtained and showed swelling but did not reveal any fractures.  The patient was given a prescription of methylprednisolone to help control the swelling. ? ?The patient also states tha her ankle was wrapped with an Ace wrap, however the swelling worsened and she removed the wrap.  She states she has not yet picked up the methylprednisolone from the pharmacy. ? ?Exam shows diffuse swelling of the right ankle with TTP at the right lateral malleolus. ? ?Plan: ?Recommend conservative management given negative x-rays yesterday. Encouraged to pick up an ankle brace from the pharmacy and to obtain methylprednisolone from the pharmacy. ?

## 2022-02-28 NOTE — Assessment & Plan Note (Signed)
The patient has a history of bilateral ear pain/fullness and a feeling of being off balance.  The patient has had extensive work-up by ENT for this in the past, including a sinus CT which is unremarkable, and audiometry showing mild sensorineural hearing loss.  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  Mainly thought to be related to denture use, therefore recommended further evaluation by dentistry or oral surgery. ? ?Today, the patient endorses constant tinnitus, constant dizziness, and constant bilateral ear pain.  She also endorses a feeling of the room spinning.  This is not exacerbated by certain positions.  She denies hearing loss.  No syncope, nausea/vomiting, or chest pain.  She states that she was seen by ENT recently for her allergies, and they did have any further recommendations, though it appears that her tinnitus and dizziness were not addressed during this visit. ? ?ENT and neuro exams today are both unremarkable for any significant findings and/or neurologic deficits. ? ?Plan: ?In regards to the patient's ear pain and tinnitus, the patient states that she will be seeing her dentist tomorrow.  Patient was encouraged to attend this appointment.  She was also encouraged to reach back out to ENT for follow-up given that she is established there.  ? ?In regards to her vertigo, it is very difficult to ascertain the description of her symptoms. She does not describe discrete episodes but rather says that her symptoms are constant.  She does not consistently have the symptoms with any movement which makes diagnoses such as BPPV and Meniere's less likely.  Aside from regular follow-up with dentistry and new follow-up with ENT, will continue to monitor.  We will see the patient in 2 months. ?

## 2022-02-28 NOTE — Progress Notes (Signed)
? ?  CC: dizziness, tinnitus ? ?HPI: ? ?Janice Williams is a 61 y.o. with past medical history as noted below who presents to the clinic today for dizziness/tinnitus. Please see problem-based list for further details, assessments, and plans.  ? ?Past Medical History:  ?Diagnosis Date  ? Allergy   ? Anemia   ? Arthritis   ? lower back, knees  ? Bronchitis   ? COPD (chronic obstructive pulmonary disease) ([place])   ? COPD with acute exacerbation ([place]) 06/09/2017  ? Dental abscess 03/15/2015  ? DYSPEPSIA 02/14/2007  ? Qualifier: Diagnosis of  By: Samara Snide    ? EXTERNAL OTITIS 01/06/2010  ? Qualifier: Diagnosis of  By: Tomma Lightning MD, Claiborne Billings    ? FACIAL RASH 02/04/2009  ? Qualifier: Diagnosis of  By: Rhunette Croft    ? Former smoker   ? quit 2014  ? GERD (gastroesophageal reflux disease)   ? HIV infection ([place])   ? Hypertension   ? Stroke [facility])   ? Substance abuse ([place])   ? history, clean 7 years  ? SVD (spontaneous vaginal delivery)   ? x 3  ? ?Review of Systems: Negative aside from that listed in individualized problem based charting.  ? ?Physical Exam: ? ?Vitals:  ? 02/28/22 0951  ?BP: (!) 142/96  ?Pulse: 94  ?Temp: 98.4 ?F (36.9 ?C)  ?TempSrc: Oral  ?SpO2: 100%  ?Weight: 119 lb 11.2 oz (54.3 kg)  ?Height: '5\' 4"'$  (1.626 m)  ? ?General: NAD, nl appearance ?HE: Normocephalic, atraumatic, EOMI, Conjunctivae normal ?ENT: No congestion, no rhinorrhea, no exudate or erythema. Both TM are visible and normal-appearing. ?Cardiovascular: Normal rate, regular rhythm. No murmurs, rubs, or gallops ?Pulmonary: Effort normal, breath sounds normal. No wheezes, rales, or rhonchi ?Abdominal: soft, nontender, bowel sounds present ?Musculoskeletal: no deformity. Diffuse swelling and TTP at right lateral malleolus. ?Skin: Warm, dry, no bruising, erythema, or rash ?Psychiatric/Behavioral: normal mood, normal behavior    ? ?Assessment & Plan:  ? ?See Encounters Tab for problem based charting. ? ?Patient discussed with Dr.  Cain Sieve ? ?

## 2022-02-28 NOTE — Patient Instructions (Addendum)
Thank you, [name] for allowing [place] to provide your care today. Today we discussed the ringing in your ears and right ankle pain. ? ?Ankle pain: We recommend supportive care for your ankle. We unfortunately do not have any boots or ankle braces here, but you can also go to the drug store to obtain an ankle brace. ? ?Ringing in ears/dizziness: Be sure to attend your dental appointment tomorrow. We think this may be the cause of the symptoms in your ears. Please also reach out to the ear, nose, and throat doctors to talk about this more too. You may also be due for hearing testing at this point. ? ?I have ordered the following labs for you: ? ?Lab Orders  ?No laboratory test(s) ordered today  ?  ? ?Referrals ordered today:  ? ?Referral Orders  ?No referral(s) requested today  ?  ? ?I have ordered the following medication/changed the following medications:  ? ?Stop the following medications: ?There are no discontinued medications.  ? ?Start the following medications: ?No orders of the defined types were placed in this encounter. ?  ? ?Follow up:  2 months   ? ? ?Should you have any questions or concerns please call the internal medicine clinic at [phone].   ? ? ? ?

## 2022-03-01 DIAGNOSIS — M1990 Unspecified osteoarthritis, unspecified site: Secondary | ICD-10-CM | POA: Diagnosis not present

## 2022-03-01 NOTE — Progress Notes (Signed)
Internal Medicine Clinic Attending ° °Case discussed with Dr. Bonanno  °  At the time of the visit.  We reviewed the resident’s history and exam and pertinent patient test results.  I agree with the assessment, diagnosis, and plan of care documented in the resident’s note. ° °

## 2022-03-02 ENCOUNTER — Other Ambulatory Visit (HOSPITAL_COMMUNITY): Payer: Self-pay

## 2022-03-02 DIAGNOSIS — M1990 Unspecified osteoarthritis, unspecified site: Secondary | ICD-10-CM | POA: Diagnosis not present

## 2022-03-03 ENCOUNTER — Other Ambulatory Visit: Payer: Self-pay | Admitting: Internal Medicine

## 2022-03-03 DIAGNOSIS — J301 Allergic rhinitis due to pollen: Secondary | ICD-10-CM | POA: Diagnosis not present

## 2022-03-03 DIAGNOSIS — Z7689 Persons encountering health services in other specified circumstances: Secondary | ICD-10-CM | POA: Diagnosis not present

## 2022-03-03 DIAGNOSIS — J3089 Other allergic rhinitis: Secondary | ICD-10-CM | POA: Diagnosis not present

## 2022-03-03 DIAGNOSIS — M1990 Unspecified osteoarthritis, unspecified site: Secondary | ICD-10-CM | POA: Diagnosis not present

## 2022-03-04 ENCOUNTER — Other Ambulatory Visit: Payer: Self-pay | Admitting: Internal Medicine

## 2022-03-06 ENCOUNTER — Other Ambulatory Visit: Payer: Self-pay

## 2022-03-06 DIAGNOSIS — M1990 Unspecified osteoarthritis, unspecified site: Secondary | ICD-10-CM | POA: Diagnosis not present

## 2022-03-06 MED ORDER — TRAMADOL HCL 50 MG PO TABS: 50.0000 mg | ORAL_TABLET | Freq: Four times a day (QID) | ORAL | 0 refills | Status: DC | PRN

## 2022-03-06 NOTE — Telephone Encounter (Signed)
Attempted to contact [facility] regarding Attending's DEA number. They are currently closed and will reopen after 2. ?

## 2022-03-07 ENCOUNTER — Other Ambulatory Visit (HOSPITAL_COMMUNITY): Payer: Self-pay

## 2022-03-07 DIAGNOSIS — M1990 Unspecified osteoarthritis, unspecified site: Secondary | ICD-10-CM | POA: Diagnosis not present

## 2022-03-08 ENCOUNTER — Telehealth: Payer: Self-pay | Admitting: Internal Medicine

## 2022-03-08 DIAGNOSIS — M1990 Unspecified osteoarthritis, unspecified site: Secondary | ICD-10-CM | POA: Diagnosis not present

## 2022-03-08 NOTE — Telephone Encounter (Signed)
Pt calling back to f/u with a Request for an Eye Dr.  [name]. Seen on 02/28/2022 and states she requested  a Referral due to her Blurry vision. ? ?Please advise if a referral can be placed. ?

## 2022-03-09 DIAGNOSIS — M1990 Unspecified osteoarthritis, unspecified site: Secondary | ICD-10-CM | POA: Diagnosis not present

## 2022-03-10 ENCOUNTER — Other Ambulatory Visit: Payer: Self-pay | Admitting: Internal Medicine

## 2022-03-10 DIAGNOSIS — Z7689 Persons encountering health services in other specified circumstances: Secondary | ICD-10-CM | POA: Diagnosis not present

## 2022-03-10 DIAGNOSIS — J3089 Other allergic rhinitis: Secondary | ICD-10-CM | POA: Diagnosis not present

## 2022-03-10 DIAGNOSIS — H538 Other visual disturbances: Secondary | ICD-10-CM

## 2022-03-10 DIAGNOSIS — J301 Allergic rhinitis due to pollen: Secondary | ICD-10-CM | POA: Diagnosis not present

## 2022-03-10 NOTE — Progress Notes (Signed)
Patient called Triage for a opthalmology referral. Recently saw Dr.Bonnano in clinic and this was not discussed.Called patient and she has been having blurred vision. She is taking her allergy medication as prescribed, but reports her eyes do feel stuck together when she wakes up. Eyes are not painful. She does not believe it is related to her allergies. She has not had a recent eye exam. I will place referral for ophthalmology. ?

## 2022-03-13 ENCOUNTER — Other Ambulatory Visit: Payer: Self-pay

## 2022-03-13 DIAGNOSIS — M1990 Unspecified osteoarthritis, unspecified site: Secondary | ICD-10-CM | POA: Diagnosis not present

## 2022-03-13 DIAGNOSIS — K219 Gastro-esophageal reflux disease without esophagitis: Secondary | ICD-10-CM

## 2022-03-13 DIAGNOSIS — R634 Abnormal weight loss: Secondary | ICD-10-CM

## 2022-03-13 MED ORDER — MEGESTROL ACETATE 40 MG/ML PO SUSP: 400.0000 mg | Freq: Every day | ORAL | 2 refills | Status: DC

## 2022-03-13 MED ORDER — OMEPRAZOLE 20 MG PO CPDR: 20.0000 mg | DELAYED_RELEASE_CAPSULE | Freq: Every day | ORAL | 5 refills | Status: DC

## 2022-03-13 NOTE — Telephone Encounter (Signed)
omeprazole (PRILOSEC) 20 MG capsule, refill request @  ?[facility] ([place] [address] [place] ? ?Per pt the pharmacy do not have  megestrol (MEGACE) 40 MG/ML suspension. Please call pt back.  ?

## 2022-03-14 ENCOUNTER — Other Ambulatory Visit: Payer: Self-pay

## 2022-03-14 DIAGNOSIS — M1990 Unspecified osteoarthritis, unspecified site: Secondary | ICD-10-CM | POA: Diagnosis not present

## 2022-03-14 DIAGNOSIS — R634 Abnormal weight loss: Secondary | ICD-10-CM

## 2022-03-15 DIAGNOSIS — M1990 Unspecified osteoarthritis, unspecified site: Secondary | ICD-10-CM | POA: Diagnosis not present

## 2022-03-16 DIAGNOSIS — M1990 Unspecified osteoarthritis, unspecified site: Secondary | ICD-10-CM | POA: Diagnosis not present

## 2022-03-17 DIAGNOSIS — M1990 Unspecified osteoarthritis, unspecified site: Secondary | ICD-10-CM | POA: Diagnosis not present

## 2022-03-17 DIAGNOSIS — J3089 Other allergic rhinitis: Secondary | ICD-10-CM | POA: Diagnosis not present

## 2022-03-17 DIAGNOSIS — J3081 Allergic rhinitis due to animal (cat) (dog) hair and dander: Secondary | ICD-10-CM | POA: Diagnosis not present

## 2022-03-17 DIAGNOSIS — Z7689 Persons encountering health services in other specified circumstances: Secondary | ICD-10-CM | POA: Diagnosis not present

## 2022-03-17 DIAGNOSIS — J301 Allergic rhinitis due to pollen: Secondary | ICD-10-CM | POA: Diagnosis not present

## 2022-03-18 DIAGNOSIS — Z419 Encounter for procedure for purposes other than remedying health state, unspecified: Secondary | ICD-10-CM | POA: Diagnosis not present

## 2022-03-20 DIAGNOSIS — M1990 Unspecified osteoarthritis, unspecified site: Secondary | ICD-10-CM | POA: Diagnosis not present

## 2022-03-21 DIAGNOSIS — Z7689 Persons encountering health services in other specified circumstances: Secondary | ICD-10-CM | POA: Diagnosis not present

## 2022-03-21 DIAGNOSIS — H5713 Ocular pain, bilateral: Secondary | ICD-10-CM | POA: Diagnosis not present

## 2022-03-21 DIAGNOSIS — H40013 Open angle with borderline findings, low risk, bilateral: Secondary | ICD-10-CM | POA: Diagnosis not present

## 2022-03-21 DIAGNOSIS — H40033 Anatomical narrow angle, bilateral: Secondary | ICD-10-CM | POA: Diagnosis not present

## 2022-03-21 DIAGNOSIS — H18053 Posterior corneal pigmentations, bilateral: Secondary | ICD-10-CM | POA: Diagnosis not present

## 2022-03-21 DIAGNOSIS — H16223 Keratoconjunctivitis sicca, not specified as Sjogren's, bilateral: Secondary | ICD-10-CM | POA: Diagnosis not present

## 2022-03-21 DIAGNOSIS — H2513 Age-related nuclear cataract, bilateral: Secondary | ICD-10-CM | POA: Diagnosis not present

## 2022-03-21 DIAGNOSIS — M1990 Unspecified osteoarthritis, unspecified site: Secondary | ICD-10-CM | POA: Diagnosis not present

## 2022-03-22 DIAGNOSIS — M1990 Unspecified osteoarthritis, unspecified site: Secondary | ICD-10-CM | POA: Diagnosis not present

## 2022-03-23 DIAGNOSIS — M1990 Unspecified osteoarthritis, unspecified site: Secondary | ICD-10-CM | POA: Diagnosis not present

## 2022-03-23 NOTE — Telephone Encounter (Signed)
Spoke to patient and she was able to refill this medication yesterday.

## 2022-03-24 DIAGNOSIS — M1990 Unspecified osteoarthritis, unspecified site: Secondary | ICD-10-CM | POA: Diagnosis not present

## 2022-03-24 DIAGNOSIS — Z7689 Persons encountering health services in other specified circumstances: Secondary | ICD-10-CM | POA: Diagnosis not present

## 2022-03-27 DIAGNOSIS — Z7689 Persons encountering health services in other specified circumstances: Secondary | ICD-10-CM | POA: Diagnosis not present

## 2022-03-28 DIAGNOSIS — M1990 Unspecified osteoarthritis, unspecified site: Secondary | ICD-10-CM | POA: Diagnosis not present

## 2022-03-29 DIAGNOSIS — M1990 Unspecified osteoarthritis, unspecified site: Secondary | ICD-10-CM | POA: Diagnosis not present

## 2022-03-30 ENCOUNTER — Other Ambulatory Visit (HOSPITAL_COMMUNITY): Payer: Self-pay

## 2022-03-30 DIAGNOSIS — M1990 Unspecified osteoarthritis, unspecified site: Secondary | ICD-10-CM | POA: Diagnosis not present

## 2022-03-31 DIAGNOSIS — Z7689 Persons encountering health services in other specified circumstances: Secondary | ICD-10-CM | POA: Diagnosis not present

## 2022-03-31 DIAGNOSIS — M1990 Unspecified osteoarthritis, unspecified site: Secondary | ICD-10-CM | POA: Diagnosis not present

## 2022-03-31 DIAGNOSIS — J301 Allergic rhinitis due to pollen: Secondary | ICD-10-CM | POA: Diagnosis not present

## 2022-03-31 DIAGNOSIS — J3089 Other allergic rhinitis: Secondary | ICD-10-CM | POA: Diagnosis not present

## 2022-04-03 DIAGNOSIS — M1990 Unspecified osteoarthritis, unspecified site: Secondary | ICD-10-CM | POA: Diagnosis not present

## 2022-04-04 DIAGNOSIS — M1990 Unspecified osteoarthritis, unspecified site: Secondary | ICD-10-CM | POA: Diagnosis not present

## 2022-04-05 ENCOUNTER — Other Ambulatory Visit: Payer: Self-pay | Admitting: Internal Medicine

## 2022-04-05 ENCOUNTER — Other Ambulatory Visit (HOSPITAL_COMMUNITY): Payer: Self-pay

## 2022-04-05 ENCOUNTER — Other Ambulatory Visit (HOSPITAL_COMMUNITY): Payer: Self-pay | Admitting: Oral Surgery

## 2022-04-05 ENCOUNTER — Other Ambulatory Visit: Payer: Self-pay | Admitting: Oral Surgery

## 2022-04-05 DIAGNOSIS — M1990 Unspecified osteoarthritis, unspecified site: Secondary | ICD-10-CM | POA: Diagnosis not present

## 2022-04-05 DIAGNOSIS — F1190 Opioid use, unspecified, uncomplicated: Secondary | ICD-10-CM

## 2022-04-05 DIAGNOSIS — M26633 Articular disc disorder of bilateral temporomandibular joint: Secondary | ICD-10-CM

## 2022-04-05 MED ORDER — TRAMADOL HCL 50 MG PO TABS: 50.0000 mg | ORAL_TABLET | Freq: Four times a day (QID) | ORAL | 0 refills | Status: DC | PRN

## 2022-04-05 NOTE — Progress Notes (Signed)
Refill sent to [facility] as requested.  ?- traMADol (ULTRAM) 50 MG tablet; Take 1 tablet (50 mg total) by mouth every 6 (six) hours as needed.  Dispense: 60 tablet; Refill: 0 ? ?

## 2022-04-05 NOTE — Telephone Encounter (Signed)
Pt states her pharmacy asked for her to call as her medication is on back order and she wil need to try another location. ? ?The patient is wishing to use the [facility] on [address] instead. ? ? ? traMADol (ULTRAM) 50 MG tablet ?

## 2022-04-05 NOTE — Telephone Encounter (Signed)
Patient called back stating [facility] will not accept PCP's DEA. Requesting this be resent by Attending. ? ?Confirmed appt with PCP for 4/21 at 0945 for f/u after fall. ?

## 2022-04-05 NOTE — Addendum Note (Signed)
Addended by: Velora Heckler on: 04/05/2022 02:09 PM ? ? Modules accepted: Orders ? ?

## 2022-04-06 ENCOUNTER — Ambulatory Visit (AMBULATORY_SURGERY_CENTER): Payer: Medicaid Other

## 2022-04-06 VITALS — Ht 64.0 in | Wt 119.0 lb

## 2022-04-06 DIAGNOSIS — M1990 Unspecified osteoarthritis, unspecified site: Secondary | ICD-10-CM | POA: Diagnosis not present

## 2022-04-06 DIAGNOSIS — Z86010 Personal history of colonic polyps: Secondary | ICD-10-CM

## 2022-04-06 MED ORDER — PLENVU 140 G PO SOLR: 1.0000 | ORAL | 0 refills | Status: DC

## 2022-04-06 NOTE — Progress Notes (Signed)
No egg or soy allergy known to patient  ?No issues known to pt with past sedation with any surgeries or procedures ?Patient denies ever being told they had issues or difficulty with intubation  ?No FH of Malignant Hyperthermia ?Pt is not on diet pills ?Pt is not on home 02  ?Pt is not on blood thinners  ?Pt reports issues with constipation - patient reports she is using the Sorbitol to relieve constipation; ?No A fib or A flutter ?Coupon code sent in RX; NO PA's for preps discussed with pt in PV today  ?Discussed with pt there will be an out-of-pocket cost for prep and that varies from $0 to 70 + dollars - pt verbalized understanding  ?Pt instructed to use Singlecare.com or GoodRx for a price reduction on prep  ?PV completed over the phone. Pt verified name, DOB, address and insurance during PV today.  ?Pt mailed instruction packet with copy of consent form to read and not return, and instructions.  ?Pt encouraged to call with questions or issues.  ?If pt has My chart, procedure instructions sent via My Chart  ?Insurance confirmed with pt at [facility] today  ? ?

## 2022-04-07 ENCOUNTER — Encounter: Payer: Medicaid Other | Admitting: Internal Medicine

## 2022-04-07 DIAGNOSIS — M1990 Unspecified osteoarthritis, unspecified site: Secondary | ICD-10-CM | POA: Diagnosis not present

## 2022-04-10 DIAGNOSIS — M1990 Unspecified osteoarthritis, unspecified site: Secondary | ICD-10-CM | POA: Diagnosis not present

## 2022-04-11 ENCOUNTER — Telehealth: Payer: Self-pay | Admitting: Internal Medicine

## 2022-04-11 ENCOUNTER — Other Ambulatory Visit: Payer: Self-pay | Admitting: Internal Medicine

## 2022-04-11 ENCOUNTER — Encounter: Payer: Self-pay | Admitting: Internal Medicine

## 2022-04-11 ENCOUNTER — Ambulatory Visit (INDEPENDENT_AMBULATORY_CARE_PROVIDER_SITE_OTHER): Payer: Medicaid Other | Admitting: Internal Medicine

## 2022-04-11 VITALS — BP 148/91 | HR 78 | Temp 98.9°F | Ht 64.0 in | Wt 126.0 lb

## 2022-04-11 DIAGNOSIS — M1990 Unspecified osteoarthritis, unspecified site: Secondary | ICD-10-CM | POA: Diagnosis not present

## 2022-04-11 DIAGNOSIS — S93411D Sprain of calcaneofibular ligament of right ankle, subsequent encounter: Secondary | ICD-10-CM | POA: Diagnosis not present

## 2022-04-11 DIAGNOSIS — R634 Abnormal weight loss: Secondary | ICD-10-CM

## 2022-04-11 DIAGNOSIS — Z7689 Persons encountering health services in other specified circumstances: Secondary | ICD-10-CM | POA: Diagnosis not present

## 2022-04-11 DIAGNOSIS — S93411A Sprain of calcaneofibular ligament of right ankle, initial encounter: Secondary | ICD-10-CM

## 2022-04-11 DIAGNOSIS — E785 Hyperlipidemia, unspecified: Secondary | ICD-10-CM

## 2022-04-11 MED ORDER — BUDESONIDE-FORMOTEROL FUMARATE 80-4.5 MCG/ACT IN AERO: 2.0000 | INHALATION_SPRAY | Freq: Every day | RESPIRATORY_TRACT | 4 refills | Status: DC

## 2022-04-11 NOTE — Telephone Encounter (Signed)
Spoke with pt and verified pharmacy. Symbicort refill was sent in. Nothing further needed at this time.  ?

## 2022-04-11 NOTE — Progress Notes (Signed)
? ?  CC: Right ankle injury ? ?HPI:[name] is a 61 y.o. female who presents for evaluation of right ankle injury. Please see individual problem based A/P for details. ? ? ?Past Medical History:  ?Diagnosis Date  ? Anemia   ? Arthritis   ? generalized  ? Blood transfusion without reported diagnosis 2003  ? Bronchitis   ? Cataract   ? COPD (chronic obstructive pulmonary disease) ([place])   ? COPD with acute exacerbation ([place]) 06/09/2017  ? Dental abscess 03/15/2015  ? DYSPEPSIA 02/14/2007  ? Qualifier: Diagnosis of  By: Samara Snide    ? EXTERNAL OTITIS 01/06/2010  ? Qualifier: Diagnosis of  By: Tomma Lightning MD, Claiborne Billings    ? FACIAL RASH 02/04/2009  ? Qualifier: Diagnosis of  By: Rhunette Croft    ? Former smoker   ? quit 2014  ? GERD (gastroesophageal reflux disease)   ? Glaucoma   ? HIV infection ([place])   ? Hypertension   ? Osteoporosis   ? Seasonal allergies   ? Stroke [facility]) 2003  ? Substance abuse ([place])   ? history, clean 7 years  ? SVD (spontaneous vaginal delivery)   ? x 3  ? ?Review of Systems:   ?Review of Systems  ?Constitutional:  Negative for chills and fever.  ?Musculoskeletal:  Positive for falls (remote) and joint pain.   ? ?Physical Exam: ?Vitals:  ? 04/11/22 0858 04/11/22 0942  ?BP: (!) 147/96 (!) 148/91  ?Pulse: (!) 101 78  ?Temp: 98.9 ?F (37.2 ?C)   ?TempSrc: Oral   ?SpO2: 100%   ?Weight: 126 lb (57.2 kg)   ?Height: '5\' 4"'$  (1.626 m)   ? ? ?right ankle ?Swelling over lateral malleolus , without ecchymoses ?Pain with inversion ?TTP over calcaneofibular ligament ?negative ant drawer and postive talar tilt.   ?Negative syndesmotic compression. ?Thompsons test negative. ?NV intact distally. ? ? ? ?Assessment & Plan:  ? ?Right ankle injury ?Patient here for follow up of right ankle injury. Initial visit on 02/28/22, but reports right ankle was injured almost a year ago. She stepped off of porch step and inverted right ankle. I reviewed xray and no acute fracture. Patient continues to have edema around right  lateral malleolus. Tenderness on palpation over calcaneofibular ligament. Patient is unable to bear weight. Increase talar tilt. Discussed referral to sports medicine with patient. SM will be able to ultrasound and if patient has ligamentous injury will be able to determine degree of injury. ?Plan ? ?- Referral to SM ? ?WEIGHT LOSS ?Wt Readings from Last 3 Encounters:  ?04/11/22 126 lb (57.2 kg)  ?04/06/22 119 lb (54 kg)  ?02/28/22 119 lb 11.2 oz (54.3 kg)  ? ?Weight improved on Megace. No blood clots on medication. Will continue , and reevaluate need to continue medication at her next visit.  ? ? ? ?Patient discussed with Dr.  Cain Sieve ? ?

## 2022-04-12 ENCOUNTER — Other Ambulatory Visit: Payer: Self-pay | Admitting: Oral Surgery

## 2022-04-12 ENCOUNTER — Encounter: Payer: Self-pay | Admitting: Internal Medicine

## 2022-04-12 DIAGNOSIS — M26639 Articular disc disorder of temporomandibular joint, unspecified side: Secondary | ICD-10-CM

## 2022-04-12 DIAGNOSIS — M1990 Unspecified osteoarthritis, unspecified site: Secondary | ICD-10-CM | POA: Diagnosis not present

## 2022-04-12 NOTE — Progress Notes (Signed)
?  Janice Williams, Janice Williams is a 61 yo who was referred by DDS for TMJ evaluation.  ? ?CC: Pain right and left ear pain, ringing in ears x 1 year. ? ?HPI: Recent ENT visit negative for ear infection, CT sinuses negative.  ? ?Past Medical History: High Blood Pressure, COPD, Sinus Issues, HIV, Osteoporosis ? ?Medications: Amlodipine, Pravastatin, Omeprazole, Allergy medicine, Biktarvy, Tramadol, Levocetirizine  ? ?Allergies: Chantix ? ?Exam: Edentulous maxilla and mandible.  Late opening bilateral clicking of the TMJ. The maximum opening was  70 mm (ridge to ridge), with normal range of motion. The muscles of mastication were tender to palpation. There was no crepitus present.Mallampati 1. Oral cancer screening negative. Pharynx clear. no lymphadenopathy ? ?Pan: Normal right  TMJ joint anatomy. Left Condyle not in image ? ?Assessment: ASA  3. Bilateral TMJ disc dislocation.  ? ?Plan:  Recommend conservative treatment of NSAIDs, muscle relaxant,  Ice, heat, and soft diet. RTO 2 weeks. MRI if not improved.   ?  ?Rx: None ? ?Gae Bon, DMD   ? 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 ? ? ?

## 2022-04-12 NOTE — Assessment & Plan Note (Addendum)
Wt Readings from Last 3 Encounters:  ?04/11/22 126 lb (57.2 kg)  ?04/06/22 119 lb (54 kg)  ?02/28/22 119 lb 11.2 oz (54.3 kg)  ? ?Weight improved on Megace. No blood clots on medication. Will continue , and reevaluate need to continue medication at her next visit.  ?

## 2022-04-12 NOTE — Patient Instructions (Signed)
Thank you for trusting me with your care. To recap, today we discussed the following: ? ? ?Sprain of calcaneofibular ligament of right ankle ?- Ambulatory referral to Sports Medicine ? ? ? ? ?

## 2022-04-12 NOTE — Assessment & Plan Note (Signed)
Patient here for follow up of right ankle injury. Initial visit on 02/28/22, but reports right ankle was injured almost a year ago. She stepped off of porch step and inverted right ankle. I reviewed xray and no acute fracture. Patient continues to have edema around right lateral malleolus. Tenderness on palpation over calcaneofibular ligament. Patient is unable to bear weight. Increase talar tilt. Discussed referral to sports medicine with patient. SM will be able to ultrasound and if patient has ligamentous injury will be able to determine degree of injury. ?Plan ? ?- Referral to SM ?

## 2022-04-13 ENCOUNTER — Other Ambulatory Visit: Payer: Self-pay | Admitting: Oral Surgery

## 2022-04-13 DIAGNOSIS — M26609 Unspecified temporomandibular joint disorder, unspecified side: Secondary | ICD-10-CM

## 2022-04-13 DIAGNOSIS — M1990 Unspecified osteoarthritis, unspecified site: Secondary | ICD-10-CM | POA: Diagnosis not present

## 2022-04-13 DIAGNOSIS — M2669 Other specified disorders of temporomandibular joint: Secondary | ICD-10-CM

## 2022-04-13 NOTE — Progress Notes (Signed)
Internal Medicine Clinic Attending  Case discussed with Dr. Steen  At the time of the visit.  We reviewed the resident's history and exam and pertinent patient test results.  I agree with the assessment, diagnosis, and plan of care documented in the resident's note.  

## 2022-04-13 NOTE — Progress Notes (Signed)
Tmj mri ?

## 2022-04-14 ENCOUNTER — Ambulatory Visit (HOSPITAL_COMMUNITY): Payer: Medicaid Other

## 2022-04-14 DIAGNOSIS — J301 Allergic rhinitis due to pollen: Secondary | ICD-10-CM | POA: Diagnosis not present

## 2022-04-14 DIAGNOSIS — Z7689 Persons encountering health services in other specified circumstances: Secondary | ICD-10-CM | POA: Diagnosis not present

## 2022-04-14 DIAGNOSIS — J3089 Other allergic rhinitis: Secondary | ICD-10-CM | POA: Diagnosis not present

## 2022-04-14 DIAGNOSIS — M1990 Unspecified osteoarthritis, unspecified site: Secondary | ICD-10-CM | POA: Diagnosis not present

## 2022-04-17 DIAGNOSIS — M1990 Unspecified osteoarthritis, unspecified site: Secondary | ICD-10-CM | POA: Diagnosis not present

## 2022-04-17 DIAGNOSIS — Z419 Encounter for procedure for purposes other than remedying health state, unspecified: Secondary | ICD-10-CM | POA: Diagnosis not present

## 2022-04-18 DIAGNOSIS — M1990 Unspecified osteoarthritis, unspecified site: Secondary | ICD-10-CM | POA: Diagnosis not present

## 2022-04-19 DIAGNOSIS — M1990 Unspecified osteoarthritis, unspecified site: Secondary | ICD-10-CM | POA: Diagnosis not present

## 2022-04-20 ENCOUNTER — Ambulatory Visit (AMBULATORY_SURGERY_CENTER): Payer: Medicaid Other | Admitting: Gastroenterology

## 2022-04-20 ENCOUNTER — Encounter: Payer: Self-pay | Admitting: Gastroenterology

## 2022-04-20 VITALS — BP 143/92 | HR 87 | Temp 96.2°F | Resp 15 | Ht 64.0 in | Wt 119.0 lb

## 2022-04-20 DIAGNOSIS — J449 Chronic obstructive pulmonary disease, unspecified: Secondary | ICD-10-CM | POA: Diagnosis not present

## 2022-04-20 DIAGNOSIS — M1990 Unspecified osteoarthritis, unspecified site: Secondary | ICD-10-CM | POA: Diagnosis not present

## 2022-04-20 DIAGNOSIS — D120 Benign neoplasm of cecum: Secondary | ICD-10-CM | POA: Diagnosis not present

## 2022-04-20 DIAGNOSIS — Z86010 Personal history of colonic polyps: Secondary | ICD-10-CM

## 2022-04-20 DIAGNOSIS — I10 Essential (primary) hypertension: Secondary | ICD-10-CM | POA: Diagnosis not present

## 2022-04-20 DIAGNOSIS — D122 Benign neoplasm of ascending colon: Secondary | ICD-10-CM | POA: Diagnosis not present

## 2022-04-20 DIAGNOSIS — Z1211 Encounter for screening for malignant neoplasm of colon: Secondary | ICD-10-CM | POA: Diagnosis not present

## 2022-04-20 MED ORDER — SODIUM CHLORIDE 0.9 % IV SOLN: 500.0000 mL | Freq: Once | INTRAVENOUS | Status: DC

## 2022-04-20 NOTE — Progress Notes (Signed)
[facility] History and Physical ? ? ?Primary Care Physician:  Madalyn Rob, MD ? ? ?Reason for Procedure:  History of adenomatous colon polyps ? ?Plan:    Surveillance colonoscopy with possible interventions as needed ? ? ? ? ?HPI: Janice Williams is a very pleasant 61 y.o. female here for surveillance colonoscopy. ?Denies any nausea, vomiting, abdominal pain, melena or bright red blood per rectum ? ?The risks and benefits as well as alternatives of endoscopic procedure(s) have been discussed and reviewed. All questions answered. The patient agrees to proceed. ? ? ? ?Past Medical History:  ?Diagnosis Date  ? Anemia   ? Arthritis   ? generalized  ? Blood transfusion without reported diagnosis 2003  ? Bronchitis   ? Cataract   ? COPD (chronic obstructive pulmonary disease) ([place])   ? COPD with acute exacerbation ([place]) 06/09/2017  ? Dental abscess 03/15/2015  ? DYSPEPSIA 02/14/2007  ? Qualifier: Diagnosis of  By: Samara Snide    ? EXTERNAL OTITIS 01/06/2010  ? Qualifier: Diagnosis of  By: Tomma Lightning MD, Claiborne Billings    ? FACIAL RASH 02/04/2009  ? Qualifier: Diagnosis of  By: Rhunette Croft    ? Former smoker   ? quit 2014  ? GERD (gastroesophageal reflux disease)   ? Glaucoma   ? HIV infection ([place])   ? Hypertension   ? Osteoporosis   ? Seasonal allergies   ? Stroke [facility]) 2003  ? Substance abuse ([place])   ? history, clean 7 years  ? SVD (spontaneous vaginal delivery)   ? x 3  ? ? ?Past Surgical History:  ?Procedure Laterality Date  ? COLONOSCOPY  2019  ? KN-MAC-suprep(adeq)-tics/hems/TA x 5  ? MULTIPLE TOOTH EXTRACTIONS    ? with sedation  ? POLYPECTOMY  01/2008  ? TA x 5  ? TONSILLECTOMY  1973  ? TUBAL LIGATION    ? UPPER GI ENDOSCOPY    ? normal per patient - "years ago"  ? ? ?Prior to Admission medications   ?Medication Sig Start Date End Date Taking? Authorizing Provider  ?albuterol (VENTOLIN HFA) 108 (90 Base) MCG/ACT inhaler Inhale 2 puffs into the lungs every 6 (six) hours as needed for wheezing or shortness of  breath. 01/26/22  Yes Madalyn Rob, MD  ?amLODipine (NORVASC) 10 MG tablet Take 1 tablet by mouth once daily 01/26/22  Yes Madalyn Rob, MD  ?bictegravir-emtricitabine-tenofovir AF (BIKTARVY) 50-200-25 MG TABS tablet Take 1 tablet by mouth daily. 09/26/21  Yes Golden Circle, FNP  ?budesonide-formoterol (SYMBICORT) 80-4.5 MCG/ACT inhaler Inhale 2 puffs into the lungs daily. 04/11/22  Yes Tanda Rockers, MD  ?Ensure (ENSURE) Take 237 mLs by mouth 3 (three) times daily between meals. 10/28/21  Yes Carlyle Basques, MD  ?EYSUVIS 0.25 % SUSP Apply 1 drop to eye daily at 6 (six) AM. 03/21/22  Yes [provider]  ?fluticasone (FLONASE) 50 MCG/ACT nasal spray Place 1 spray into both nostrils daily. ?Patient taking differently: Place 1 spray into both nostrils in the morning and at bedtime. 08/11/21  Yes Madalyn Rob, MD  ?megestrol (MEGACE) 40 MG/ML suspension Take 10 mLs (400 mg total) by mouth daily. 03/13/22  Yes Madalyn Rob, MD  ?omeprazole (PRILOSEC) 20 MG capsule Take 1 capsule (20 mg total) by mouth daily. 03/13/22  Yes Madalyn Rob, MD  ?pravastatin (PRAVACHOL) 40 MG tablet Take 1 tablet by mouth once daily 04/11/22  Yes Madalyn Rob, MD  ?PRESCRIPTION MEDICATION Inject 1 Dose into the skin once a week. Allergy shot in office on  Tuesdays   Yes [provider]  ?RESTASIS 0.05 % ophthalmic emulsion 1 drop 2 (two) times daily. 08/05/21  Yes [provider]  ?SYMBICORT 80-4.5 MCG/ACT inhaler Inhale 2 puffs by mouth twice daily 04/12/22  Yes Tanda Rockers, MD  ?traMADol (ULTRAM) 50 MG tablet Take 1 tablet (50 mg total) by mouth every 6 (six) hours as needed. 04/05/22  Yes Lucious Groves, DO  ?benzonatate (TESSALON) 100 MG capsule Take 1 capsule (100 mg total) by mouth every 8 (eight) hours. 12/08/21   Roemhildt, Lorin T, PA-C  ?EPIPEN 2-PAK 0.3 MG/0.3ML SOAJ injection  03/21/22   [provider]  ?levocetirizine (XYZAL) 5 MG tablet Take 5 mg by mouth every evening. ?Patient not taking:  Reported on 04/20/2022    [provider]  ?sorbitol 70 % SOLN SMARTSIG:15 Milliliter(s) By Mouth Daily PRN 03/21/22   [provider]  ?sorbitol 70 % solution Take 15 mLs by mouth daily as needed. 10/14/21   Madalyn Rob, MD  ? ? ?Current Outpatient Medications  ?Medication Sig Dispense Refill  ? albuterol (VENTOLIN HFA) 108 (90 Base) MCG/ACT inhaler Inhale 2 puffs into the lungs every 6 (six) hours as needed for wheezing or shortness of breath. 18 g 1  ? amLODipine (NORVASC) 10 MG tablet Take 1 tablet by mouth once daily 90 tablet 3  ? bictegravir-emtricitabine-tenofovir AF (BIKTARVY) 50-200-25 MG TABS tablet Take 1 tablet by mouth daily. 30 tablet 4  ? budesonide-formoterol (SYMBICORT) 80-4.5 MCG/ACT inhaler Inhale 2 puffs into the lungs daily. 1 each 4  ? Ensure (ENSURE) Take 237 mLs by mouth 3 (three) times daily between meals. 237 mL 5  ? EYSUVIS 0.25 % SUSP Apply 1 drop to eye daily at 6 (six) AM.    ? fluticasone (FLONASE) 50 MCG/ACT nasal spray Place 1 spray into both nostrils daily. (Patient taking differently: Place 1 spray into both nostrils in the morning and at bedtime.) 16 g 3  ? megestrol (MEGACE) 40 MG/ML suspension Take 10 mLs (400 mg total) by mouth daily. 480 mL 2  ? omeprazole (PRILOSEC) 20 MG capsule Take 1 capsule (20 mg total) by mouth daily. 90 capsule 5  ? pravastatin (PRAVACHOL) 40 MG tablet Take 1 tablet by mouth once daily 90 tablet 1  ? PRESCRIPTION MEDICATION Inject 1 Dose into the skin once a week. Allergy shot in office on Tuesdays    ? RESTASIS 0.05 % ophthalmic emulsion 1 drop 2 (two) times daily.    ? SYMBICORT 80-4.5 MCG/ACT inhaler Inhale 2 puffs by mouth twice daily 11 g 5  ? traMADol (ULTRAM) 50 MG tablet Take 1 tablet (50 mg total) by mouth every 6 (six) hours as needed. 60 tablet 0  ? benzonatate (TESSALON) 100 MG capsule Take 1 capsule (100 mg total) by mouth every 8 (eight) hours. 21 capsule 0  ? EPIPEN 2-PAK 0.3 MG/0.3ML SOAJ injection     ? levocetirizine  (XYZAL) 5 MG tablet Take 5 mg by mouth every evening. (Patient not taking: Reported on 04/20/2022)    ? sorbitol 70 % SOLN SMARTSIG:15 Milliliter(s) By Mouth Daily PRN    ? sorbitol 70 % solution Take 15 mLs by mouth daily as needed. 473 mL 1  ? ?Current Facility-Administered Medications  ?Medication Dose Route Frequency Provider Last Rate Last Admin  ? 0.9 %  sodium chloride infusion  500 mL Intravenous Once Fabianna Keats, Venia Minks, MD      ? ? ?Allergies as of 04/20/2022 - Review Complete 04/20/2022  ?  Allergen Reaction Noted  ? Chantix [varenicline] Itching 12/16/2012  ? ? ?Family History  ?Problem Relation Age of Onset  ? Hypertension Mother   ? Hypertension Father   ? Hyperlipidemia Father   ? COPD Father   ? Atrial fibrillation Father   ? Colon cancer Neg Hx   ? Rectal cancer Neg Hx   ? Stomach cancer Neg Hx   ? Colon polyps Neg Hx   ? Esophageal cancer Neg Hx   ? ? ?Social History  ? ?Socioeconomic History  ? Marital status: Single  ?  Spouse name: Not on file  ? Number of children: 3  ? Years of education: Not on file  ? Highest education level: Not on file  ?Occupational History  ? Not on file  ?Tobacco Use  ? Smoking status: Former  ?  Packs/day: 0.40  ?  Years: 38.00  ?  Pack years: 15.20  ?  Types: Cigarettes  ?  Start date: 12/18/2012  ?  Quit date: 11/21/2013  ?  Years since quitting: 8.4  ? Smokeless tobacco: Never  ?Vaping Use  ? Vaping Use: Never used  ?Substance and Sexual Activity  ? Alcohol use: Not Currently  ? Drug use: No  ?  Comment: Hx - clean since 1998  ? Sexual activity: Not Currently  ?  Partners: Male  ?Other Topics Concern  ? Not on file  ?Social History Narrative  ? Not on file  ? ?Social Determinants of Health  ? ?Financial Resource Strain: Not on file  ?Food Insecurity: No Food Insecurity  ? Worried About Charity fundraiser in the Last Year: Never true  ? Ran Out of Food in the Last Year: Never true  ?Transportation Needs: No Transportation Needs  ? Lack of Transportation (Medical): No  ?  Lack of Transportation (Non-Medical): No  ?Physical Activity: Not on file  ?Stress: Not on file  ?Social Connections: Not on file  ?Intimate Partner Violence: Not on file  ? ? ?Review of Systems: ? ?All other

## 2022-04-20 NOTE — Patient Instructions (Signed)

## 2022-04-20 NOTE — Progress Notes (Signed)
Called to room to assist during endoscopic procedure.  Patient ID and intended procedure confirmed with present staff. Received instructions for my participation in the procedure from the performing physician.  

## 2022-04-20 NOTE — Progress Notes (Signed)
Pt non-responsive, VVS, Report to RN  °

## 2022-04-20 NOTE — Progress Notes (Signed)
Pt's states no medical or surgical changes since previsit or office visit. 

## 2022-04-20 NOTE — Op Note (Signed)
Tony ?Patient Name: Janice Williams ?Procedure Date: 04/20/2022 11:14 AM ?MRN: [id number] ?Endoscopist: Mauri Pole , MD ?Age: 61 ?Referring MD:  ?Date of Birth: 01-19-1961 ?Gender: Female ?Account #: [id number] ?Procedure:                Colonoscopy ?Indications:              High risk colon cancer surveillance: Personal  ?                          history of colonic polyps, High risk colon cancer  ?                          surveillance: Personal history of multiple (3 or  ?                          more) adenomas ?Medicines:                Monitored Anesthesia Care ?Procedure:                Pre-Anesthesia Assessment: ?                          - Prior to the procedure, a History and Physical  ?                          was performed, and patient medications and  ?                          allergies were reviewed. The patient's tolerance of  ?                          previous anesthesia was also reviewed. The risks  ?                          and benefits of the procedure and the sedation  ?                          options and risks were discussed with the patient.  ?                          All questions were answered, and informed consent  ?                          was obtained. Prior Anticoagulants: The patient has  ?                          taken no previous anticoagulant or antiplatelet  ?                          agents. ASA Grade Assessment: III - A patient with  ?                          severe systemic disease. After reviewing the risks  ?  and benefits, the patient was deemed in  ?                          satisfactory condition to undergo the procedure. ?                          After obtaining informed consent, the colonoscope  ?                          was passed under direct vision. Throughout the  ?                          procedure, the patient's blood pressure, pulse, and  ?                          oxygen saturations were monitored  continuously. The  ?                          PCF-HQ190L Colonoscope was introduced through the  ?                          anus and advanced to the the cecum, identified by  ?                          appendiceal orifice and ileocecal valve. The  ?                          colonoscopy was performed without difficulty. The  ?                          patient tolerated the procedure well. The quality  ?                          of the bowel preparation was good. The ileocecal  ?                          valve, appendiceal orifice, and rectum were  ?                          photographed. ?Scope In: 11:18:14 AM ?Scope Out: 11:37:01 AM ?Scope Withdrawal Time: 0 hours 13 minutes 55 seconds  ?Total Procedure Duration: 0 hours 18 minutes 47 seconds  ?Findings:                 The perianal and digital rectal examinations were  ?                          normal. ?                          Two sessile polyps were found in the ascending  ?                          colon and cecum. The polyps were 3 to 4 mm in size.  ?  These polyps were removed with a cold snare.  ?                          Resection and retrieval were complete. ?                          Scattered small and large-mouthed diverticula were  ?                          found in the sigmoid colon and descending colon. ?                          Non-bleeding external and internal hemorrhoids were  ?                          found during retroflexion. The hemorrhoids were  ?                          medium-sized. ?Complications:            No immediate complications. ?Estimated Blood Loss:     Estimated blood loss was minimal. ?Impression:               - Two 3 to 4 mm polyps in the ascending colon and  ?                          in the cecum, removed with a cold snare. Resected  ?                          and retrieved. ?                          - Diverticulosis in the sigmoid colon and in the  ?                          descending  colon. ?                          - Non-bleeding external and internal hemorrhoids. ?Recommendation:           - Patient has a contact number available for  ?                          emergencies. The signs and symptoms of potential  ?                          delayed complications were discussed with the  ?                          patient. Return to normal activities tomorrow.  ?                          Written discharge instructions were provided to the  ?                          patient. ?                          -  Resume previous diet. ?                          - Continue present medications. ?                          - Await pathology results. ?                          - Repeat colonoscopy in 5 years for surveillance  ?                          based on pathology results. ?Mauri Pole, MD ?04/20/2022 11:41:26 AM ?This report has been signed electronically. ?

## 2022-04-21 ENCOUNTER — Ambulatory Visit (HOSPITAL_COMMUNITY)
Admission: RE | Admit: 2022-04-21 | Discharge: 2022-04-21 | Disposition: A | Discharge status: Home / Self Care | Payer: Medicaid Other | Source: Ambulatory Visit | Attending: Oral Surgery | Admitting: Oral Surgery

## 2022-04-21 DIAGNOSIS — M1990 Unspecified osteoarthritis, unspecified site: Secondary | ICD-10-CM | POA: Diagnosis not present

## 2022-04-21 DIAGNOSIS — M2669 Other specified disorders of temporomandibular joint: Secondary | ICD-10-CM | POA: Insufficient documentation

## 2022-04-21 DIAGNOSIS — M26609 Unspecified temporomandibular joint disorder, unspecified side: Secondary | ICD-10-CM | POA: Insufficient documentation

## 2022-04-21 DIAGNOSIS — J3089 Other allergic rhinitis: Secondary | ICD-10-CM | POA: Diagnosis not present

## 2022-04-21 DIAGNOSIS — Z7689 Persons encountering health services in other specified circumstances: Secondary | ICD-10-CM | POA: Diagnosis not present

## 2022-04-21 DIAGNOSIS — J301 Allergic rhinitis due to pollen: Secondary | ICD-10-CM | POA: Diagnosis not present

## 2022-04-21 IMAGING — MR MR [PERSON_NAME]
10 series · 48 of 48 positions shown · non-contrast
Comparison: None Available.

CLINICAL DATA: TMJ pain, clicking

EXAM:
MRI OF TEMPOROMANDIBULAR JOINT WITHOUT CONTRAST
TECHNIQUE: Multiplanar, multisequence MR imaging of the temporomandibular joint
was performed following the standard protocol. No intravenous
contrast was administered.

[Series 6: T1 · axial · 3.0mm · 0.45mm/px · z∈[-79,-8]mm · 4 of 25 slices shown]
[im 1/25]
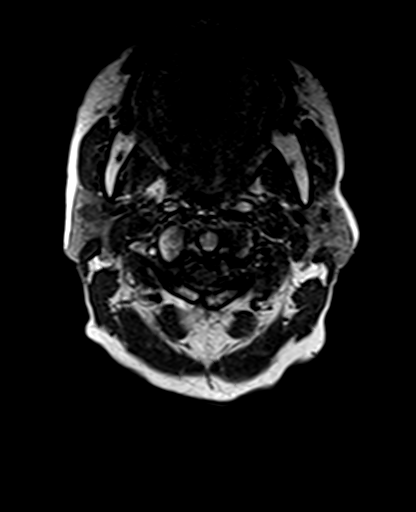
[im 9/25]
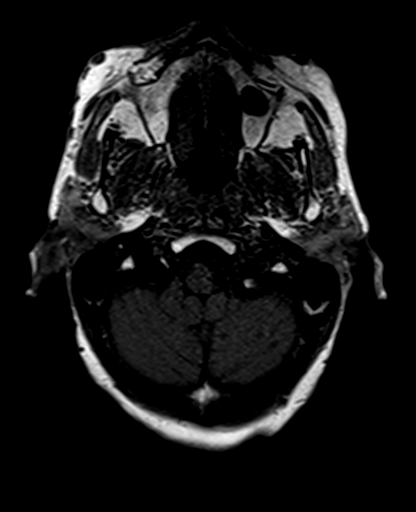
[im 17/25]
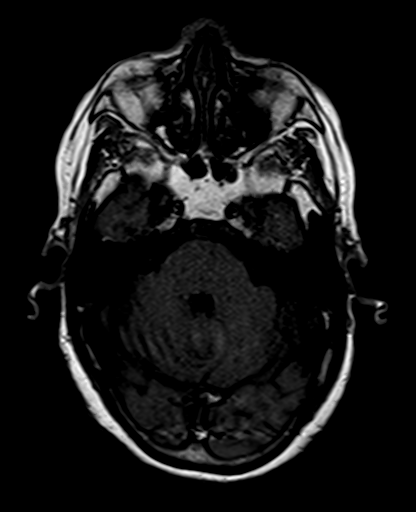
[im 25/25]
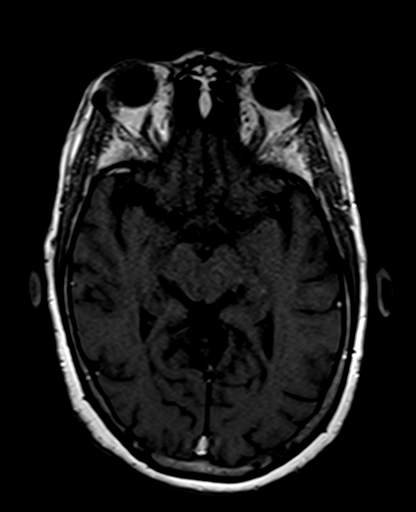

[Series 7: PD · coronal · 4.0mm · 0.59mm/px · 4 of 20 slices shown (1 of 7)]
[im 1/20]
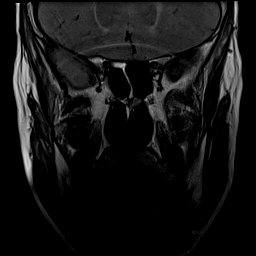
[im 7/20]
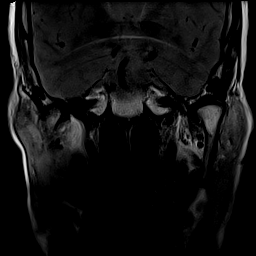
[im 13/20]
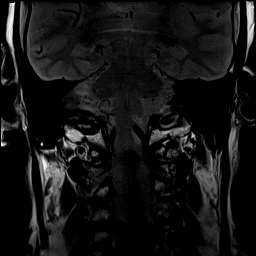
[im 20/20]
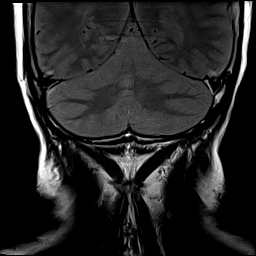

[Series 8: T2 fat-sat · sagittal · 4.0mm · 0.59mm/px · 5 of 24 slices shown (1 of 2)]
[im 1/24]
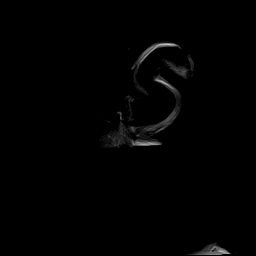
[im 6/24]
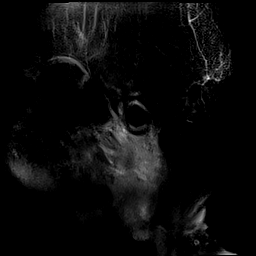
[im 12/24]
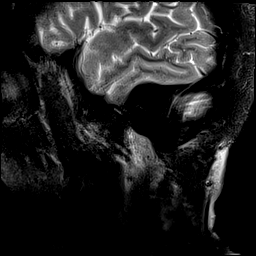
[im 18/24]
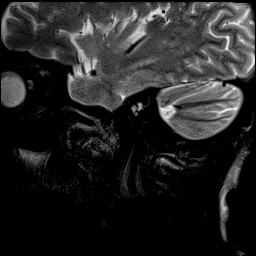
[im 24/24]
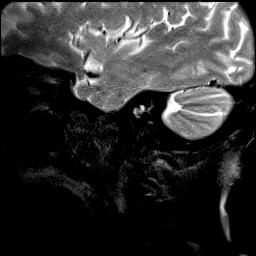

[Series 9: PD · sagittal · 4.0mm · 0.59mm/px · 5 of 24 slices shown (2 of 7)]
[im 1/24]
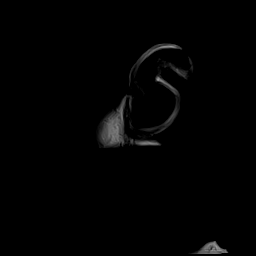
[im 6/24]
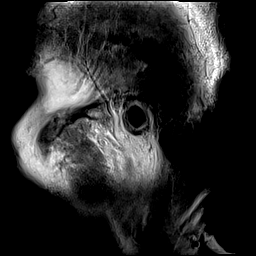
[im 12/24]
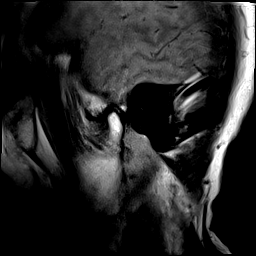
[im 18/24]
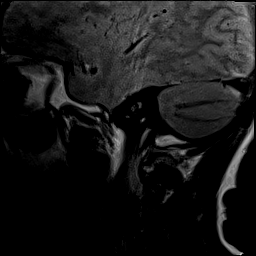
[im 24/24]
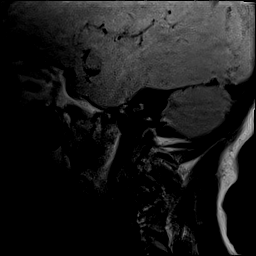

[Series 10: T2 fat-sat · sagittal · 4.0mm · 0.59mm/px · 5 of 24 slices shown (2 of 2)]
[im 1/24]
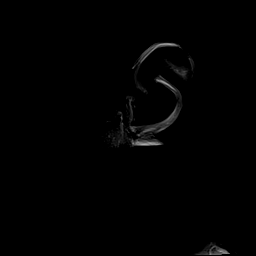
[im 6/24]
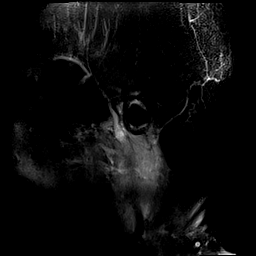
[im 12/24]
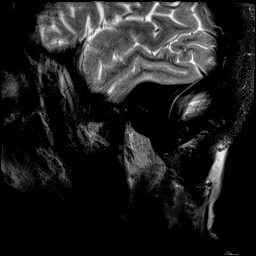
[im 18/24]
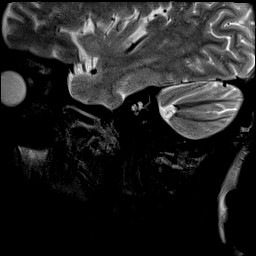
[im 24/24]
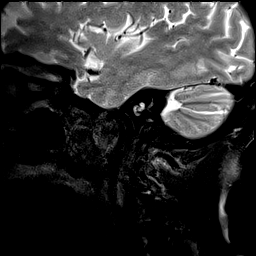

[Series 11: PD · sagittal · 4.0mm · 0.59mm/px · 5 of 24 slices shown (3 of 7)]
[im 1/24]
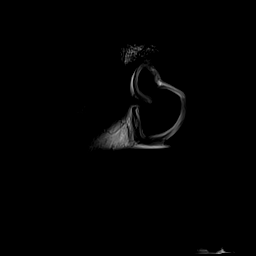
[im 6/24]
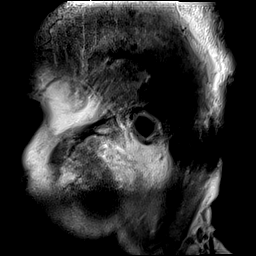
[im 12/24]
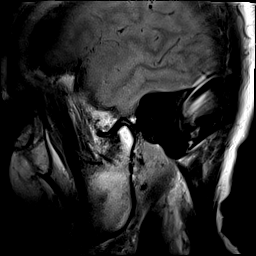
[im 18/24]
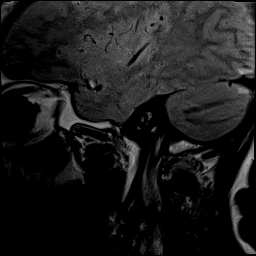
[im 24/24]
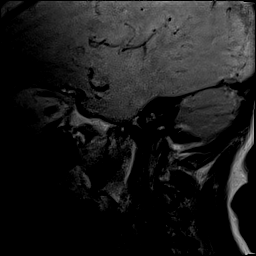

[Series 12: PD · sagittal · 4.0mm · 0.59mm/px · 5 of 24 slices shown (4 of 7)]
[im 1/24]
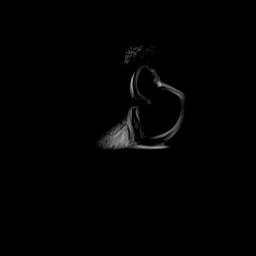
[im 6/24]
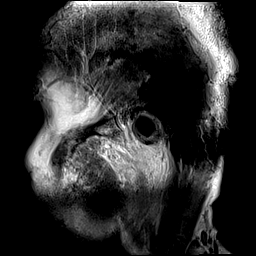
[im 12/24]
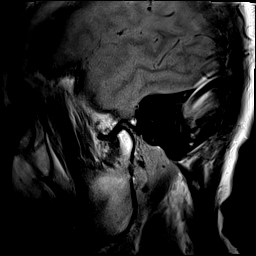
[im 18/24]
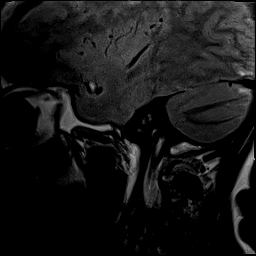
[im 24/24]
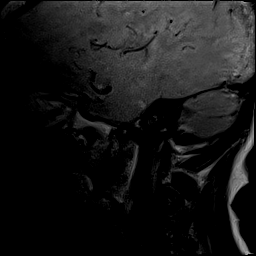

[Series 13: PD · sagittal · 4.0mm · 0.59mm/px · 5 of 24 slices shown (5 of 7)]
[im 1/24]
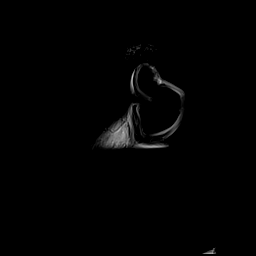
[im 6/24]
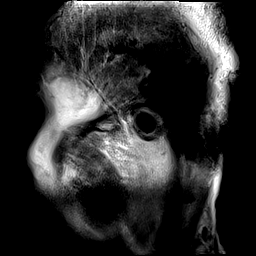
[im 12/24]
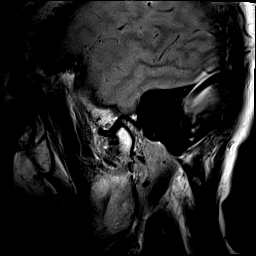
[im 18/24]
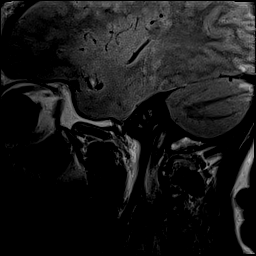
[im 24/24]
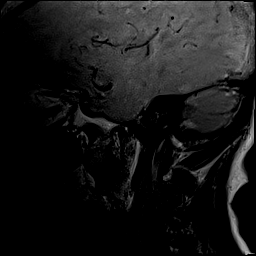

[Series 14: PD · sagittal · 4.0mm · 0.59mm/px · 5 of 23 slices shown (6 of 7)]
[im 1/23]
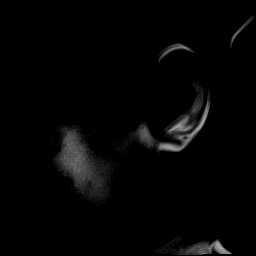
[im 6/23]
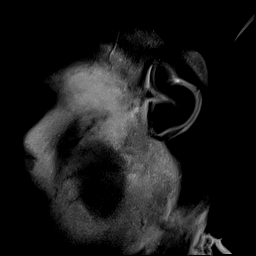
[im 12/23]
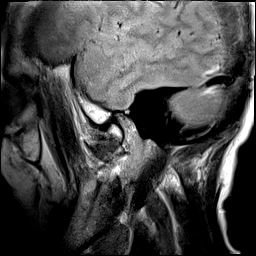
[im 17/23]
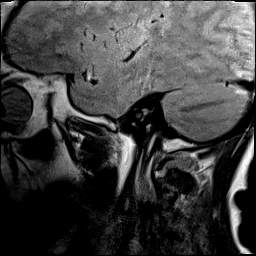
[im 23/23]
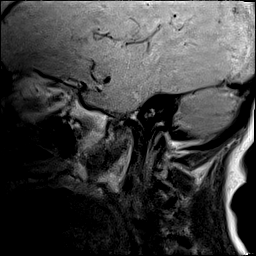

[Series 15: PD · sagittal · 4.0mm · 0.59mm/px · 5 of 23 slices shown (7 of 7)]
[im 1/23]
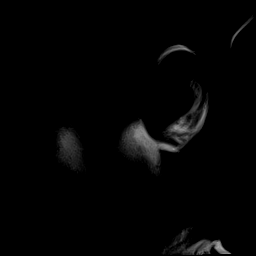
[im 6/23]
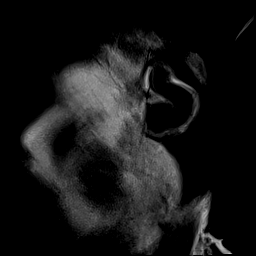
[im 12/23]
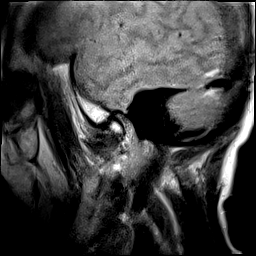
[im 17/23]
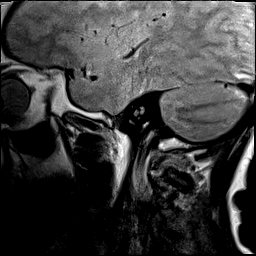
[im 23/23]
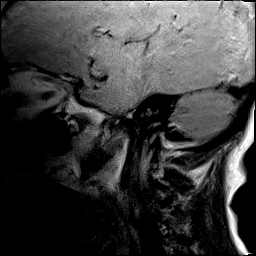

[48 of 48 positions shown; findings below may reference images not displayed]

FINDINGS: Right temporomandibular joint: Attenuation of the right articular
disc consistent with degeneration. Mandibular condyle is normally
positioned in the closed position. Articular disc is normally
positioned in the open and closed mouth position. No significant
anterior translation of the mandibular condyle along the articular
eminence with 8 and 12 mm of mouth opening. Minimal anterior
translation along the articular eminence with 16 and 20 mm of mouth
opening. No significant joint effusion. No erosive changes. Mild
osteoarthritis of the right temporomandibular joint.

Left temporomandibular joint: Attenuation of the right articular
disc consistent with degeneration. Mandibular condyle is normally
positioned in the closed position. Articular disc is normally
positioned in the open and closed mouth position. Progressive normal
anterior translation of the mandibular condyle along the articular
eminence 8, 12, 16 and 20 mm of mouth opening. No significant joint
effusion. No erosive changes. Mild osteoarthritis of the left
temporomandibular joint.

Other: No marrow signal abnormality. No aggressive osseous lesion.
Visualized brain demonstrates no focal abnormality. Orbits are
normal. Visualized paranasal sinuses are clear. Mastoid sinuses are
clear.
IMPRESSION: 1. Bilateral abnormal temporomandibular joints as described above,
right worse than left.

## 2022-04-24 ENCOUNTER — Telehealth: Payer: Self-pay | Admitting: *Deleted

## 2022-04-24 ENCOUNTER — Other Ambulatory Visit: Payer: Self-pay | Admitting: Family

## 2022-04-24 ENCOUNTER — Other Ambulatory Visit (HOSPITAL_COMMUNITY): Payer: Self-pay

## 2022-04-24 DIAGNOSIS — M1990 Unspecified osteoarthritis, unspecified site: Secondary | ICD-10-CM | POA: Diagnosis not present

## 2022-04-24 DIAGNOSIS — B20 Human immunodeficiency virus [HIV] disease: Secondary | ICD-10-CM

## 2022-04-24 MED ORDER — BIKTARVY 50-200-25 MG PO TABS
1.0000 | ORAL_TABLET | Freq: Every day | ORAL | 4 refills | Status: DC
  Filled 2022-04-24: qty 30, 30d supply, fill #0
  Filled 2022-05-23: qty 30, 30d supply, fill #1
  Filled 2022-06-15: qty 30, 30d supply, fill #2
  Filled 2022-07-18: qty 30, 30d supply, fill #3
  Filled 2022-08-16: qty 30, 30d supply, fill #4

## 2022-04-24 NOTE — Telephone Encounter (Signed)
?  Follow up Call- ? ? ?  04/20/2022  ? 10:34 AM  ?Call back number  ?Post procedure Call Back phone  # [phone]  ?Permission to leave phone message Yes  ?  ? ?Patient questions: ? ?Do you have a fever, pain , or abdominal swelling? No. ?Pain Score  0 * ? ?Have you tolerated food without any problems? Yes.   ? ?Have you been able to return to your normal activities? Yes.   ? ?Do you have any questions about your discharge instructions: ?Diet   No. ?Medications  No. ?Follow up visit  No. ? ?Do you have questions or concerns about your Care? No. ? ?Actions: ?* If pain score is 4 or above: ?No action needed, pain <4. ? ? ?

## 2022-04-25 ENCOUNTER — Ambulatory Visit (INDEPENDENT_AMBULATORY_CARE_PROVIDER_SITE_OTHER): Payer: Medicaid Other | Admitting: Sports Medicine

## 2022-04-25 ENCOUNTER — Telehealth: Payer: Self-pay

## 2022-04-25 VITALS — BP 152/90 | Ht 63.5 in | Wt 127.0 lb

## 2022-04-25 DIAGNOSIS — M1990 Unspecified osteoarthritis, unspecified site: Secondary | ICD-10-CM | POA: Diagnosis not present

## 2022-04-25 DIAGNOSIS — M25571 Pain in right ankle and joints of right foot: Secondary | ICD-10-CM

## 2022-04-25 DIAGNOSIS — F1190 Opioid use, unspecified, uncomplicated: Secondary | ICD-10-CM

## 2022-04-25 DIAGNOSIS — G8929 Other chronic pain: Secondary | ICD-10-CM

## 2022-04-25 DIAGNOSIS — M21371 Foot drop, right foot: Secondary | ICD-10-CM | POA: Diagnosis not present

## 2022-04-25 MED ORDER — TRAMADOL HCL 50 MG PO TABS: 50.0000 mg | ORAL_TABLET | Freq: Four times a day (QID) | ORAL | 0 refills | Status: DC | PRN

## 2022-04-25 NOTE — Telephone Encounter (Signed)
Requesting to speak with a nurse about recalled on megestrol (MEGACE) 40 MG/ML suspension. Please call pt back.  ?

## 2022-04-25 NOTE — Progress Notes (Signed)
PCP: Madalyn Rob, MD ? ?Subjective:  ? ?HPI: ?Patient is a 61 y.o. 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 Patient remembers hearing a pop and immediate pain after the injury. She went to an urgent care where ankle XR there showed no evidence of fracture. The ankle has been swollen and painful since the incident and it has not improved despite trying Tylenol 500 mg, Naproxen, and most recently a six-day Prednisone course two weeks ago. The pain is localized all along anterior ankle joint and in the lateral and medial ankle. The pain is worse with walking but also present at rest; patient notes that the pain is waking her up at night. Patient has not re-injured the ankle and she denies any sensory deficit in the foot, although she does have chronic foot-drop with decreased ROM and strength from CVA in the 1990s.  ? ?Patient has a history of HIV and reports she is doing otherwise. She had an MRI of the mandible for TMJ and a surveillance colonoscopy last week.  ? ? ? ?Past Medical History:  ?Diagnosis Date  ? Anemia   ? Arthritis   ? generalized  ? Blood transfusion without reported diagnosis 2003  ? Bronchitis   ? Cataract   ? COPD (chronic obstructive pulmonary disease) ([place])   ? COPD with acute exacerbation ([place]) 06/09/2017  ? Dental abscess 03/15/2015  ? DYSPEPSIA 02/14/2007  ? Qualifier: Diagnosis of  By: Samara Snide    ? EXTERNAL OTITIS 01/06/2010  ? Qualifier: Diagnosis of  By: Tomma Lightning MD, Claiborne Billings    ? FACIAL RASH 02/04/2009  ? Qualifier: Diagnosis of  By: Rhunette Croft    ? Former smoker   ? quit 2014  ? GERD (gastroesophageal reflux disease)   ? Glaucoma   ? HIV infection ([place])   ? Hypertension   ? Osteoporosis   ? Seasonal allergies   ? Stroke [facility]) 2003  ? 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 SVD (spontaneous vaginal delivery)   ? x 3  ? ? ?Current Outpatient Medications on File  Prior to Visit  ?Medication Sig Dispense Refill  ? albuterol (VENTOLIN HFA) 108 (90 Base) MCG/ACT inhaler Inhale 2 puffs into the lungs every 6 (six) hours as needed for wheezing or shortness of breath. 18 g 1  ? amLODipine (NORVASC) 10 MG tablet Take 1 tablet by mouth once daily 90 tablet 3  ? benzonatate (TESSALON) 100 MG capsule Take 1 capsule (100 mg total) by mouth every 8 (eight) hours. 21 capsule 0  ? bictegravir-emtricitabine-tenofovir AF (BIKTARVY) 50-200-25 MG TABS tablet Take 1 tablet by mouth daily. 30 tablet 4  ? budesonide-formoterol (SYMBICORT) 80-4.5 MCG/ACT inhaler Inhale 2 puffs into the lungs daily. 1 each 4  ? Ensure (ENSURE) Take 237 mLs by mouth 3 (three) times daily between meals. 237 mL 5  ? EPIPEN 2-PAK 0.3 MG/0.3ML SOAJ injection     ? EYSUVIS 0.25 % SUSP Apply 1 drop to eye daily at 6 (six) AM.    ? fluticasone (FLONASE) 50 MCG/ACT nasal spray Place 1 spray into both nostrils daily. (Patient taking differently: Place 1 spray into both nostrils in the morning and at bedtime.) 16 g 3  ? levocetirizine (XYZAL) 5 MG tablet Take 5 mg by mouth every evening. (Patient not taking: Reported on 04/20/2022)    ? megestrol (MEGACE) 40 MG/ML suspension Take  10 mLs (400 mg total) by mouth daily. 480 mL 2  ? omeprazole (PRILOSEC) 20 MG capsule Take 1 capsule (20 mg total) by mouth daily. 90 capsule 5  ? pravastatin (PRAVACHOL) 40 MG tablet Take 1 tablet by mouth once daily 90 tablet 1  ? PRESCRIPTION MEDICATION Inject 1 Dose into the skin once a week. Allergy shot in office on Tuesdays    ? RESTASIS 0.05 % ophthalmic emulsion 1 drop 2 (two) times daily.    ? sorbitol 70 % SOLN SMARTSIG:15 Milliliter(s) By Mouth Daily PRN    ? sorbitol 70 % solution Take 15 mLs by mouth daily as needed. 473 mL 1  ? SYMBICORT 80-4.5 MCG/ACT inhaler Inhale 2 puffs by mouth twice daily 11 g 5  ? ?No current facility-administered medications on file prior to visit.  ? ? ?Past Surgical History:  ?Procedure Laterality Date  ?  COLONOSCOPY  2019  ? KN-MAC-suprep(adeq)-tics/hems/TA x 5  ? MULTIPLE TOOTH EXTRACTIONS    ? with sedation  ? POLYPECTOMY  01/2008  ? TA x 5  ? TONSILLECTOMY  1973  ? TUBAL LIGATION    ? UPPER GI ENDOSCOPY    ? normal per patient - "years ago"  ? ? ?Allergies  ?Allergen Reactions  ? Chantix [Varenicline] Itching  ? ? ?BP (!) 152/90   Ht 5' 3.5" (1.613 m)   Wt 127 lb (57.6 kg)   BMI 22.14 kg/m?  ? ?   ? View : No data to display.  ?  ?  ?  ? ? ?   ? View : No data to display.  ?  ?  ?  ? ? ?    ?Objective:  ?Physical Exam: ? ?Gen: Slight discomfort secondary to ankle pain ?Lungs: No increased WOB  ?Psych: Appropriate mood and affect  ? ?MSK: High steppage gait.  ?Right foot/ankle --  ?Mild swelling and bruising over anterior talus dome extending to medial/lateral malleolus. Exquisite TTP along anterior talus dome and over medial/lateral malleolus. No TTP distal to talus on anterior surface. No TTP on plantar surface, posterior calcaneus or along achilles tendon. Appropriate plantarflexion, loss of dorsiflexion, external/internal rotation and eversion/inversion secondary to CVA. Plantarflexion strength 3/5. Anterior drawer and Talar Tilt test painful. Negative Thompson's test.   ? ?Neurovascular intact distally ? ? ?  ?Assessment & Plan:  ?1. Chronic pain/swelling of right ankle s/p sprained ankle (06/2021) --  ? ?Patient is a 61 year-old female with history of HIV and right foot-drop secondary to CVA (1990s) who has not recovered from ankle sprained last July. X-ray of the ankle at the time showed no evidence of fracture. However, ankle has continued to be swollen, bruised, and exquisitely TTP. Patient denies repeated injury and she has not responded to OTC analgesia or ice. Pain has now progressed to where it's waking her up at night. Patient's physical exam and length of time since ankle injury without improvement are concerning for chronic ankle instability vs tendinopathy vs unknown etiology. MRI could help [place]  evaluate the extent and structures involved and elucidate a diagnosis.  ? ?We discussed the following treatment plan:   ?Walking boot - patient was fitted into a boot to prevent re-injuring ankle and provide some support and stability.  ?Tramadol 50 mg every 6 hours as needed for pain.  ?Referral for AFOs to help with gait and dropped.  ?MRI of the right ankle - we will discuss results in follow-up visit.  ? ?Dominica Severin, MS4 ?[facility]  [employer] ? ?Patient seen and evaluated with the medical student.  I agree with the above plan of care.  Patient has a chronic right foot drop secondary to a CVA.  We will send her to [facility] for an AFO.  In regards to her ankle injury last summer, she still has significant pain and swelling throughout the ankle.  X-rays are unremarkable.  MRI is warranted to rule out soft tissue injury such as tendon rupture.  Follow-up with me in the office after that study to discuss those results.  In the meantime, patient was provided with a short cam walker to help with both her ankle pain as well as her chronic foot drop. ? ?This note was dictated using Dragon naturally speaking software and may contain errors in syntax, spelling, or content which have not been identified prior to signing this note.  ?

## 2022-04-25 NOTE — Telephone Encounter (Signed)
Pt stated she received a letter stating the medication, Megace, is being recall (an urgent recall)and to contact her doctor. ?

## 2022-04-26 DIAGNOSIS — M1990 Unspecified osteoarthritis, unspecified site: Secondary | ICD-10-CM | POA: Diagnosis not present

## 2022-04-26 NOTE — Telephone Encounter (Signed)
Thank you :)

## 2022-04-26 NOTE — Telephone Encounter (Signed)
It appears that it may be due to a particular company shutting down. Can you call [employer] and confirm this and see if they have an alternative? Thanks.

## 2022-04-26 NOTE — Telephone Encounter (Signed)
I called [employer] - stated they had not heard anything about a recall on Megace and asked if pt could bring the letter to them. ? ?I called the pt - asked if she could take the letter about the recall to [facility]; stated she will as soon as she can. ?

## 2022-04-27 DIAGNOSIS — M1990 Unspecified osteoarthritis, unspecified site: Secondary | ICD-10-CM | POA: Diagnosis not present

## 2022-04-28 ENCOUNTER — Encounter: Payer: Self-pay | Admitting: Gastroenterology

## 2022-04-28 ENCOUNTER — Other Ambulatory Visit (HOSPITAL_COMMUNITY): Payer: Self-pay

## 2022-04-28 DIAGNOSIS — J3081 Allergic rhinitis due to animal (cat) (dog) hair and dander: Secondary | ICD-10-CM | POA: Diagnosis not present

## 2022-04-28 DIAGNOSIS — M1990 Unspecified osteoarthritis, unspecified site: Secondary | ICD-10-CM | POA: Diagnosis not present

## 2022-04-28 DIAGNOSIS — Z7689 Persons encountering health services in other specified circumstances: Secondary | ICD-10-CM | POA: Diagnosis not present

## 2022-04-28 DIAGNOSIS — J3089 Other allergic rhinitis: Secondary | ICD-10-CM | POA: Diagnosis not present

## 2022-04-28 DIAGNOSIS — J301 Allergic rhinitis due to pollen: Secondary | ICD-10-CM | POA: Diagnosis not present

## 2022-05-01 ENCOUNTER — Encounter: Payer: Medicaid Other | Admitting: Internal Medicine

## 2022-05-01 DIAGNOSIS — M1990 Unspecified osteoarthritis, unspecified site: Secondary | ICD-10-CM | POA: Diagnosis not present

## 2022-05-03 ENCOUNTER — Encounter: Payer: Self-pay | Admitting: Internal Medicine

## 2022-05-03 DIAGNOSIS — H18513 Endothelial corneal dystrophy, bilateral: Secondary | ICD-10-CM | POA: Diagnosis not present

## 2022-05-03 DIAGNOSIS — H16223 Keratoconjunctivitis sicca, not specified as Sjogren's, bilateral: Secondary | ICD-10-CM | POA: Diagnosis not present

## 2022-05-03 DIAGNOSIS — Z7689 Persons encountering health services in other specified circumstances: Secondary | ICD-10-CM | POA: Diagnosis not present

## 2022-05-03 DIAGNOSIS — M1990 Unspecified osteoarthritis, unspecified site: Secondary | ICD-10-CM | POA: Diagnosis not present

## 2022-05-03 DIAGNOSIS — H40013 Open angle with borderline findings, low risk, bilateral: Secondary | ICD-10-CM | POA: Diagnosis not present

## 2022-05-03 DIAGNOSIS — H2513 Age-related nuclear cataract, bilateral: Secondary | ICD-10-CM | POA: Diagnosis not present

## 2022-05-03 DIAGNOSIS — H18053 Posterior corneal pigmentations, bilateral: Secondary | ICD-10-CM | POA: Diagnosis not present

## 2022-05-03 DIAGNOSIS — H40033 Anatomical narrow angle, bilateral: Secondary | ICD-10-CM | POA: Diagnosis not present

## 2022-05-03 DIAGNOSIS — H5713 Ocular pain, bilateral: Secondary | ICD-10-CM | POA: Diagnosis not present

## 2022-05-04 DIAGNOSIS — M1990 Unspecified osteoarthritis, unspecified site: Secondary | ICD-10-CM | POA: Diagnosis not present

## 2022-05-05 DIAGNOSIS — Z7689 Persons encountering health services in other specified circumstances: Secondary | ICD-10-CM | POA: Diagnosis not present

## 2022-05-05 DIAGNOSIS — J301 Allergic rhinitis due to pollen: Secondary | ICD-10-CM | POA: Diagnosis not present

## 2022-05-05 DIAGNOSIS — J3089 Other allergic rhinitis: Secondary | ICD-10-CM | POA: Diagnosis not present

## 2022-05-05 DIAGNOSIS — M1990 Unspecified osteoarthritis, unspecified site: Secondary | ICD-10-CM | POA: Diagnosis not present

## 2022-05-05 DIAGNOSIS — J3081 Allergic rhinitis due to animal (cat) (dog) hair and dander: Secondary | ICD-10-CM | POA: Diagnosis not present

## 2022-05-09 ENCOUNTER — Ambulatory Visit
Admission: RE | Admit: 2022-05-09 | Discharge: 2022-05-09 | Disposition: A | Discharge status: Home / Self Care | Payer: Medicaid Other | Source: Ambulatory Visit | Attending: Sports Medicine | Admitting: Sports Medicine

## 2022-05-09 DIAGNOSIS — G8929 Other chronic pain: Secondary | ICD-10-CM

## 2022-05-09 DIAGNOSIS — Z7689 Persons encountering health services in other specified circumstances: Secondary | ICD-10-CM | POA: Diagnosis not present

## 2022-05-09 DIAGNOSIS — M25571 Pain in right ankle and joints of right foot: Secondary | ICD-10-CM | POA: Diagnosis not present

## 2022-05-09 IMAGING — MR MR ANKLE*R* W/O CM
5 series · 40 of 40 positions shown · non-contrast
Comparison: None Available.

CLINICAL DATA: Chronic right ankle pain.

EXAM:
MRI OF THE RIGHT ANKLE WITHOUT CONTRAST
TECHNIQUE: Multiplanar, multisequence MR imaging of the ankle was performed. No
intravenous contrast was administered.

[Series 5: T2 fat-sat · axial · 3.0mm · 0.50mm/px · z∈[-57,+64]mm · 9 of 32 slices shown (1 of 2)]
[im 1/32]
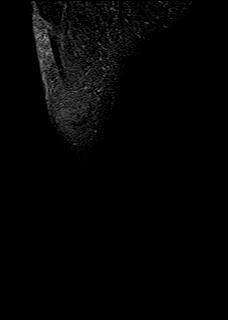
[im 4/32]
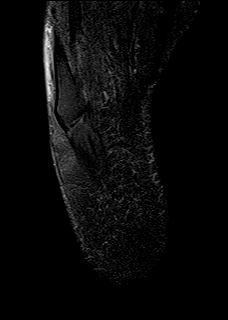
[im 8/32]
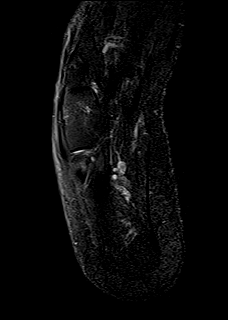
[im 12/32]
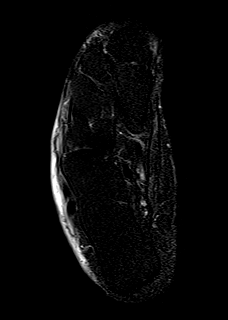
[im 16/32]
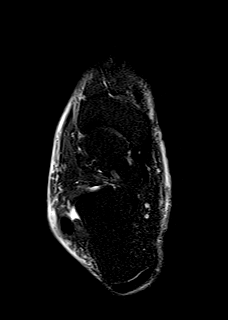
[im 20/32]
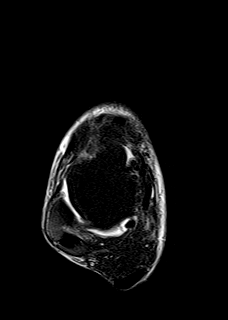
[im 24/32]
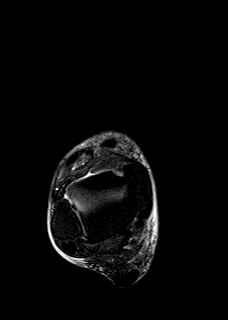
[im 28/32]
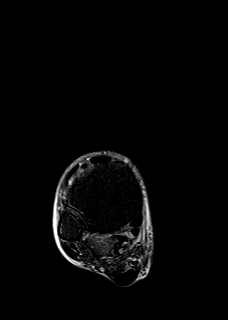
[im 32/32]
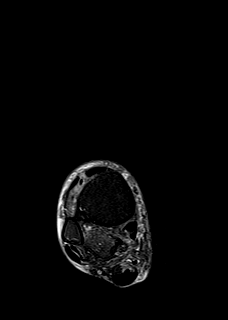

[Series 6: PD fat-sat · axial · 3.0mm · 0.50mm/px · z∈[-57,+64]mm · 9 of 32 slices shown]
[im 1/32]
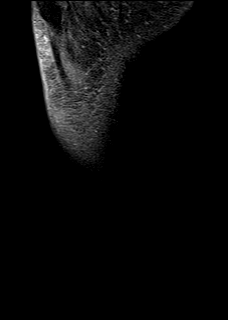
[im 4/32]
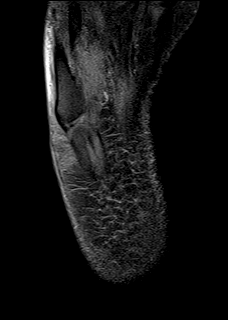
[im 8/32]
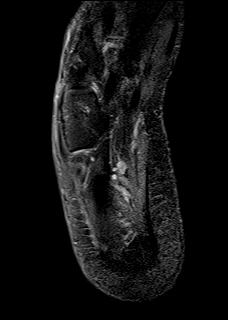
[im 12/32]
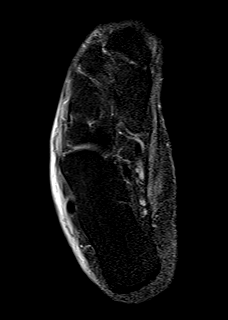
[im 16/32]
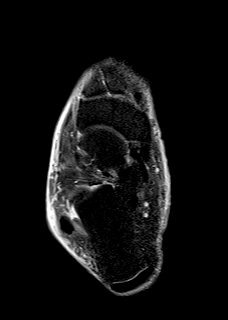
[im 20/32]
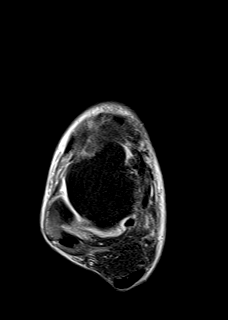
[im 24/32]
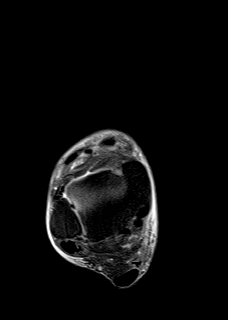
[im 28/32]
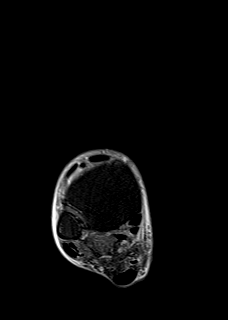
[im 32/32]
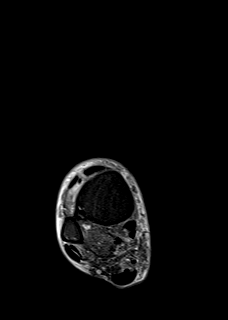

[Series 7: T1 · sagittal · 4.0mm · 0.56mm/px · 6 of 22 slices shown]
[im 1/22]
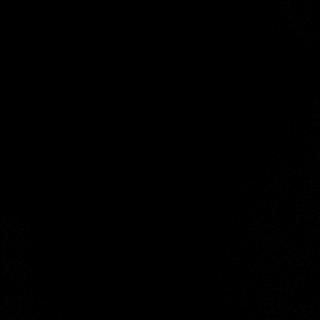
[im 5/22]
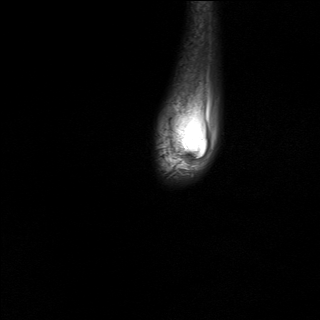
[im 9/22]
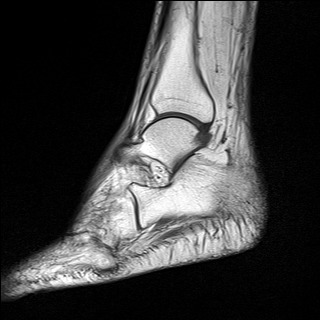
[im 13/22]
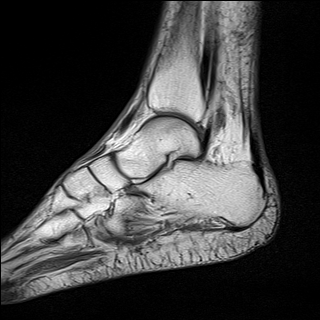
[im 17/22]
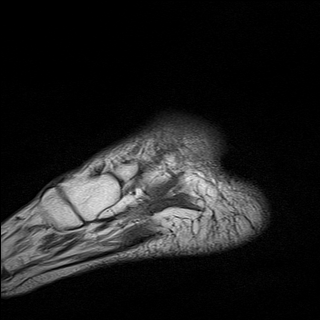
[im 22/22]
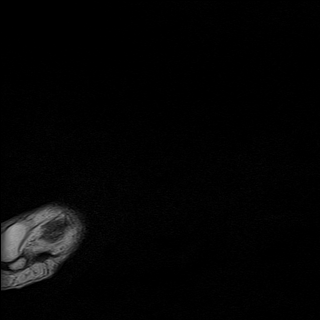

[Series 9: T2 fat-sat · coronal · 3.0mm · 0.50mm/px · 10 of 35 slices shown (2 of 2)]
[im 1/35]
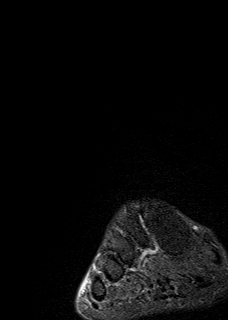
[im 4/35]
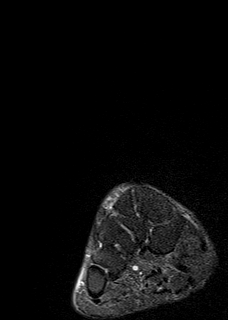
[im 8/35]
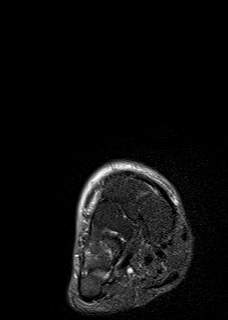
[im 12/35]
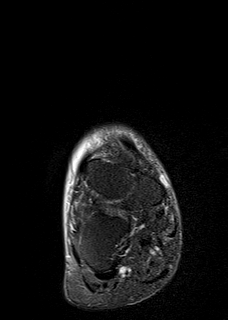
[im 16/35]
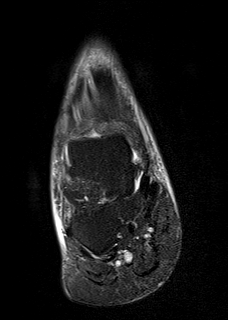
[im 19/35]
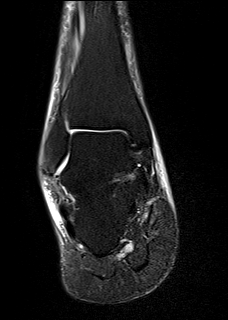
[im 23/35]
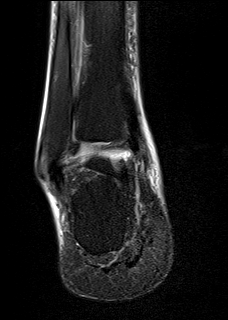
[im 27/35]
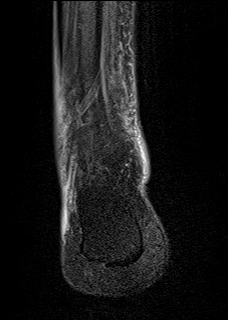
[im 31/35]
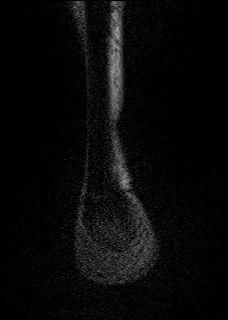
[im 35/35]
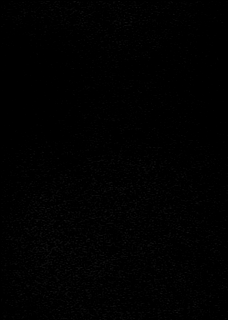

[Series 10: STIR · sagittal · 4.0mm · 0.35mm/px · 6 of 22 slices shown]
[im 1/22]
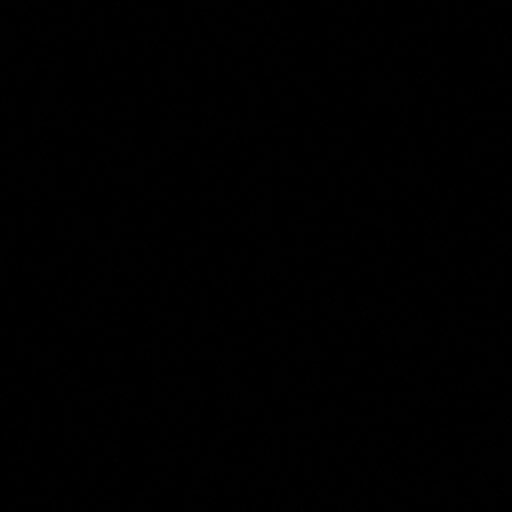
[im 5/22]
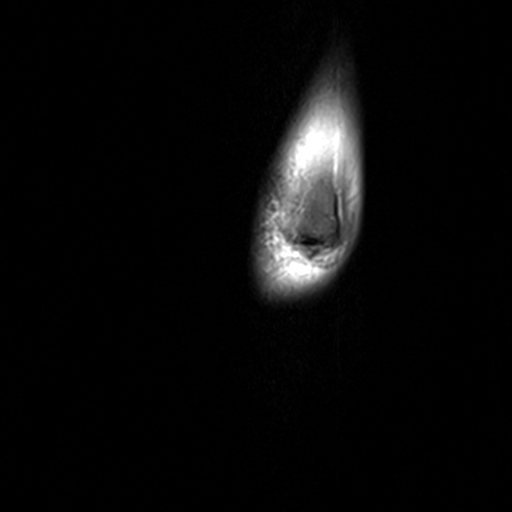
[im 9/22]
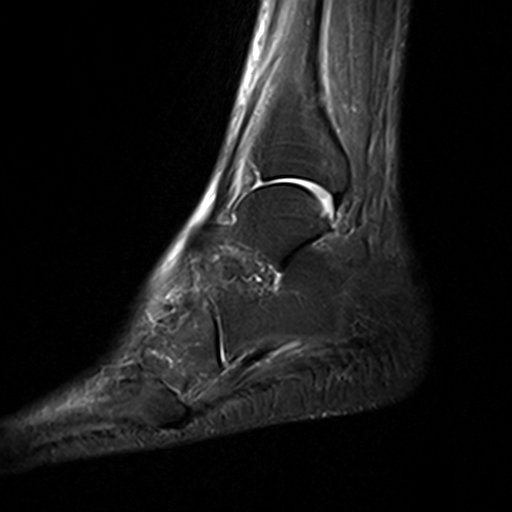
[im 13/22]
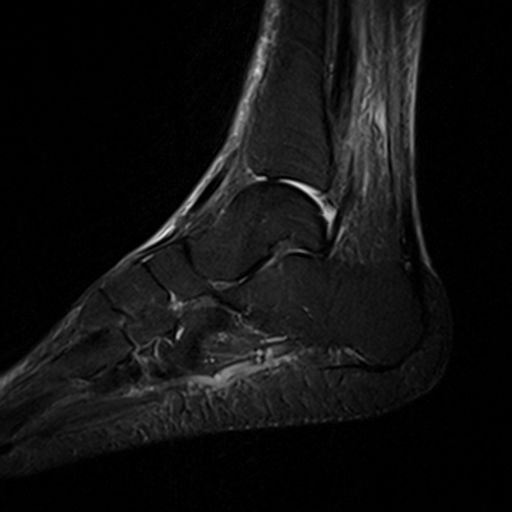
[im 17/22]
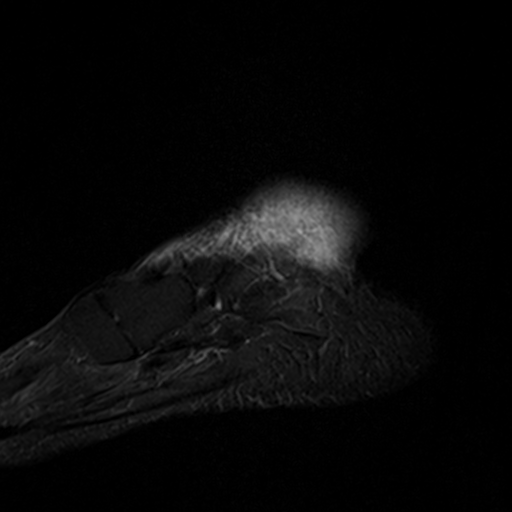
[im 22/22]
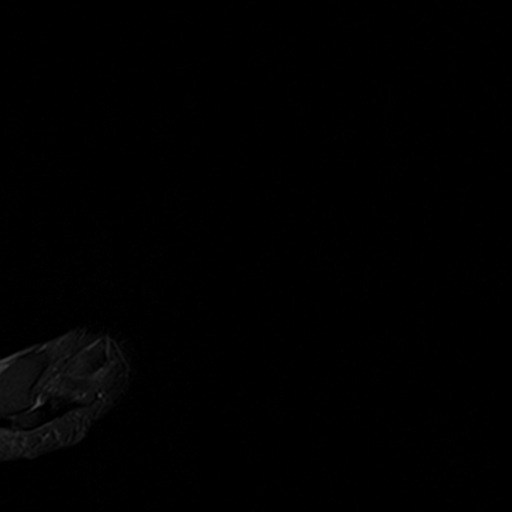

[40 of 40 positions shown; findings below may reference images not displayed]

FINDINGS: TENDONS

Peroneal: Peroneal longus tendon intact. Peroneal brevis intact.

Posteromedial: Posterior tibial tendon intact. Flexor hallucis
longus tendon intact. Flexor digitorum longus tendon intact.

Anterior: Tibialis anterior tendon intact. Extensor hallucis longus
tendon intact Extensor digitorum longus tendon intact.

Achilles:  Intact.

Plantar Fascia: Intact.

LIGAMENTS

Lateral: Anterior talofibular ligament intact. Calcaneofibular
ligament intact. Posterior talofibular ligament intact. Anterior and
posterior tibiofibular ligaments intact.

Medial: Deltoid ligament intact. Spring ligament intact.

CARTILAGE

Ankle Joint: No joint effusion. Normal ankle mortise. No
osteochondral lesion. Mild partial-thickness cartilage loss of the
tibiotalar joint.

Subtalar Joints/Sinus Tarsi: Normal subtalar joints. No subtalar
joint effusion. Normal sinus tarsi.

Bones: No aggressive osseous lesion. No fracture or dislocation.

Soft Tissue: No fluid collection or hematoma. Muscles are normal
without edema or atrophy. Tarsal tunnel is normal.
IMPRESSION: 1. No acute injury of the right ankle.
2. Mild partial-thickness cartilage loss of the tibiotalar joint.

## 2022-05-10 DIAGNOSIS — Z7689 Persons encountering health services in other specified circumstances: Secondary | ICD-10-CM | POA: Diagnosis not present

## 2022-05-12 DIAGNOSIS — Z7689 Persons encountering health services in other specified circumstances: Secondary | ICD-10-CM | POA: Diagnosis not present

## 2022-05-12 DIAGNOSIS — J3089 Other allergic rhinitis: Secondary | ICD-10-CM | POA: Diagnosis not present

## 2022-05-12 DIAGNOSIS — J301 Allergic rhinitis due to pollen: Secondary | ICD-10-CM | POA: Diagnosis not present

## 2022-05-12 DIAGNOSIS — J3081 Allergic rhinitis due to animal (cat) (dog) hair and dander: Secondary | ICD-10-CM | POA: Diagnosis not present

## 2022-05-18 ENCOUNTER — Telehealth: Payer: Self-pay | Admitting: Sports Medicine

## 2022-05-18 DIAGNOSIS — Z419 Encounter for procedure for purposes other than remedying health state, unspecified: Secondary | ICD-10-CM | POA: Diagnosis not present

## 2022-05-18 NOTE — Telephone Encounter (Signed)
  I spoke with Janice Williams on the phone today after reviewing the MRI of her right ankle.  It is fairly unremarkable.  No evidence of tendon injury or ligament injury.  She does have some mild arthritic change but not severe.  She is scheduled to get her AFO at [facility] sometime later this month.  I would like for her to remain in her cam walker until she receives that brace (for her foot drop secondary to her stroke, not for her ankle injury). I would then like to see her back in the office for reevaluation.  In the meantime tramadol does seem to be helping with her pain so I will give her one  refill.

## 2022-05-19 DIAGNOSIS — Z7689 Persons encountering health services in other specified circumstances: Secondary | ICD-10-CM | POA: Diagnosis not present

## 2022-05-19 DIAGNOSIS — J301 Allergic rhinitis due to pollen: Secondary | ICD-10-CM | POA: Diagnosis not present

## 2022-05-19 DIAGNOSIS — J3089 Other allergic rhinitis: Secondary | ICD-10-CM | POA: Diagnosis not present

## 2022-05-22 ENCOUNTER — Other Ambulatory Visit: Payer: Self-pay | Admitting: Sports Medicine

## 2022-05-22 DIAGNOSIS — F1190 Opioid use, unspecified, uncomplicated: Secondary | ICD-10-CM

## 2022-05-22 MED ORDER — TRAMADOL HCL 50 MG PO TABS: 50.0000 mg | ORAL_TABLET | Freq: Four times a day (QID) | ORAL | 0 refills | Status: DC | PRN

## 2022-05-23 ENCOUNTER — Other Ambulatory Visit (HOSPITAL_COMMUNITY): Payer: Self-pay

## 2022-05-24 ENCOUNTER — Other Ambulatory Visit: Payer: Self-pay

## 2022-05-24 DIAGNOSIS — R634 Abnormal weight loss: Secondary | ICD-10-CM

## 2022-05-24 MED ORDER — ENSURE PO LIQD: 237.0000 mL | Freq: Three times a day (TID) | ORAL | 5 refills | Status: DC

## 2022-05-26 DIAGNOSIS — J3089 Other allergic rhinitis: Secondary | ICD-10-CM | POA: Diagnosis not present

## 2022-05-26 DIAGNOSIS — J301 Allergic rhinitis due to pollen: Secondary | ICD-10-CM | POA: Diagnosis not present

## 2022-05-26 DIAGNOSIS — Z7689 Persons encountering health services in other specified circumstances: Secondary | ICD-10-CM | POA: Diagnosis not present

## 2022-05-26 DIAGNOSIS — J3081 Allergic rhinitis due to animal (cat) (dog) hair and dander: Secondary | ICD-10-CM | POA: Diagnosis not present

## 2022-05-29 DIAGNOSIS — M1990 Unspecified osteoarthritis, unspecified site: Secondary | ICD-10-CM | POA: Diagnosis not present

## 2022-05-30 ENCOUNTER — Other Ambulatory Visit (HOSPITAL_COMMUNITY): Payer: Self-pay

## 2022-05-31 DIAGNOSIS — M1990 Unspecified osteoarthritis, unspecified site: Secondary | ICD-10-CM | POA: Diagnosis not present

## 2022-06-02 DIAGNOSIS — J3081 Allergic rhinitis due to animal (cat) (dog) hair and dander: Secondary | ICD-10-CM | POA: Diagnosis not present

## 2022-06-02 DIAGNOSIS — Z7689 Persons encountering health services in other specified circumstances: Secondary | ICD-10-CM | POA: Diagnosis not present

## 2022-06-02 DIAGNOSIS — J3089 Other allergic rhinitis: Secondary | ICD-10-CM | POA: Diagnosis not present

## 2022-06-02 DIAGNOSIS — J301 Allergic rhinitis due to pollen: Secondary | ICD-10-CM | POA: Diagnosis not present

## 2022-06-02 DIAGNOSIS — M1990 Unspecified osteoarthritis, unspecified site: Secondary | ICD-10-CM | POA: Diagnosis not present

## 2022-06-05 DIAGNOSIS — M1990 Unspecified osteoarthritis, unspecified site: Secondary | ICD-10-CM | POA: Diagnosis not present

## 2022-06-06 ENCOUNTER — Other Ambulatory Visit: Payer: Self-pay | Admitting: Internal Medicine

## 2022-06-06 DIAGNOSIS — J301 Allergic rhinitis due to pollen: Secondary | ICD-10-CM

## 2022-06-06 DIAGNOSIS — Z7689 Persons encountering health services in other specified circumstances: Secondary | ICD-10-CM | POA: Diagnosis not present

## 2022-06-07 DIAGNOSIS — M1990 Unspecified osteoarthritis, unspecified site: Secondary | ICD-10-CM | POA: Diagnosis not present

## 2022-06-09 DIAGNOSIS — J301 Allergic rhinitis due to pollen: Secondary | ICD-10-CM | POA: Diagnosis not present

## 2022-06-09 DIAGNOSIS — J3089 Other allergic rhinitis: Secondary | ICD-10-CM | POA: Diagnosis not present

## 2022-06-09 DIAGNOSIS — Z7689 Persons encountering health services in other specified circumstances: Secondary | ICD-10-CM | POA: Diagnosis not present

## 2022-06-09 DIAGNOSIS — J3081 Allergic rhinitis due to animal (cat) (dog) hair and dander: Secondary | ICD-10-CM | POA: Diagnosis not present

## 2022-06-09 DIAGNOSIS — M1990 Unspecified osteoarthritis, unspecified site: Secondary | ICD-10-CM | POA: Diagnosis not present

## 2022-06-13 DIAGNOSIS — H2511 Age-related nuclear cataract, right eye: Secondary | ICD-10-CM | POA: Diagnosis not present

## 2022-06-14 DIAGNOSIS — Z7689 Persons encountering health services in other specified circumstances: Secondary | ICD-10-CM | POA: Diagnosis not present

## 2022-06-15 ENCOUNTER — Other Ambulatory Visit (HOSPITAL_COMMUNITY): Payer: Self-pay

## 2022-06-16 DIAGNOSIS — Z7689 Persons encountering health services in other specified circumstances: Secondary | ICD-10-CM | POA: Diagnosis not present

## 2022-06-16 DIAGNOSIS — J301 Allergic rhinitis due to pollen: Secondary | ICD-10-CM | POA: Diagnosis not present

## 2022-06-16 DIAGNOSIS — J3081 Allergic rhinitis due to animal (cat) (dog) hair and dander: Secondary | ICD-10-CM | POA: Diagnosis not present

## 2022-06-16 DIAGNOSIS — J3089 Other allergic rhinitis: Secondary | ICD-10-CM | POA: Diagnosis not present

## 2022-06-17 DIAGNOSIS — Z419 Encounter for procedure for purposes other than remedying health state, unspecified: Secondary | ICD-10-CM | POA: Diagnosis not present

## 2022-06-19 ENCOUNTER — Encounter: Payer: Self-pay | Admitting: Internal Medicine

## 2022-06-19 ENCOUNTER — Ambulatory Visit (INDEPENDENT_AMBULATORY_CARE_PROVIDER_SITE_OTHER): Payer: Medicaid Other | Admitting: Internal Medicine

## 2022-06-19 ENCOUNTER — Other Ambulatory Visit: Payer: Self-pay

## 2022-06-19 VITALS — BP 120/80 | HR 95 | Temp 98.5°F | Ht 64.0 in | Wt 118.0 lb

## 2022-06-19 DIAGNOSIS — Z79899 Other long term (current) drug therapy: Secondary | ICD-10-CM

## 2022-06-19 DIAGNOSIS — Z7689 Persons encountering health services in other specified circumstances: Secondary | ICD-10-CM | POA: Diagnosis not present

## 2022-06-19 DIAGNOSIS — J301 Allergic rhinitis due to pollen: Secondary | ICD-10-CM | POA: Diagnosis not present

## 2022-06-19 DIAGNOSIS — B20 Human immunodeficiency virus [HIV] disease: Secondary | ICD-10-CM | POA: Diagnosis not present

## 2022-06-19 DIAGNOSIS — R636 Underweight: Secondary | ICD-10-CM | POA: Diagnosis not present

## 2022-06-19 MED ORDER — MIRTAZAPINE 7.5 MG PO TABS: 7.5000 mg | ORAL_TABLET | Freq: Every day | ORAL | 3 refills | Status: DC

## 2022-06-19 NOTE — Progress Notes (Signed)
RFV: follow up with hiv disease  Patient ID: Janice Williams, female   DOB: Dec 24, 1960, 61 y.o.   MRN: [id number]  HPI 61yo F with HIV disease, well controlled, cd 4 count 903/VL 36 in Feb 2023) has good adherence to her regimen. She has recently Had right eye cataract surgery -- and has next surgery in august -for left lens replacement. Noticed some improvement with right eye surgery (only a week out- 20/20) Has been off of megace for 3 weeks. Doing okay with now at 118 (was up to 125lb ) Outpatient Encounter Medications 61yo F with HIV disease Sig   albuterol (VENTOLIN HFA) 108 (90 Base) MCG/ACT inhaler Inhale 2 puffs into the lungs every 6 (six) hours as needed for wheezing or shortness of breath.   amLODipine (NORVASC) 10 MG tablet Take 1 tablet by mouth once daily   bictegravir-emtricitabine-tenofovir AF (BIKTARVY) 50-200-25 MG TABS tablet Take 1 tablet by mouth daily.   budesonide-formoterol (SYMBICORT) 80-4.5 MCG/ACT inhaler Inhale 2 puffs into the lungs daily.   Ensure (ENSURE) Take 237 mLs by mouth 3 (three) times daily between meals.   EPIPEN 2-PAK 0.3 MG/0.3ML SOAJ injection    fluticasone (FLONASE) 50 MCG/ACT nasal spray Use 1 spray(s) in each nostril once daily   ketorolac (ACULAR) 0.5 % ophthalmic solution Place 1 drop into the right eye 4 (four) times daily.   ofloxacin (OCUFLOX) 0.3 % ophthalmic solution Place 1 drop into the right eye 4 (four) times daily.   omeprazole (PRILOSEC) 20 MG capsule Take 1 capsule (20 mg total) by mouth daily.   pravastatin (PRAVACHOL) 40 MG tablet Take 1 tablet by mouth once daily   prednisoLONE acetate (PRED FORTE) 1 % ophthalmic suspension Place 1 drop into the right eye 4 (four) times daily. Until next wednesday   PRESCRIPTION MEDICATION Inject 1 Dose into the skin once a week. Allergy shot in office on Fridays   RESTASIS 0.05 % ophthalmic emulsion 1 drop 2 (two) times daily.   sorbitol 70 % SOLN SMARTSIG:15 Milliliter(s) By Mouth Daily PRN    sorbitol 70 % solution Take 15 mLs by mouth daily as needed.   SYMBICORT 80-4.5 MCG/ACT inhaler Inhale 2 puffs by mouth twice daily   traMADol (ULTRAM) 50 MG tablet Take 1 tablet (50 mg total) by mouth every 6 (six) hours as needed.   benzonatate (TESSALON) 100 MG capsule Take 1 capsule (100 mg total) by mouth every 8 (eight) hours. (Patient not taking: Reported on 06/19/2022)   EYSUVIS 0.25 % SUSP Apply 1 drop to eye daily at 6 (six) AM.   levocetirizine (XYZAL) 5 MG tablet Take 5 mg by mouth every evening. (Patient not taking: Reported on 04/20/2022)   megestrol (MEGACE) 40 MG/ML suspension Take 10 mLs (400 mg total) by mouth daily. (Patient not taking: Reported on 06/19/2022)   No facility-administered encounter medications on file as of 06/19/2022.     Patient Active Problem List   Diagnosis Date Noted   Sprain of calcaneofibular ligament 02/28/2022   Need for shingles vaccine 08/14/2021   Constipation 05/12/2021   Cough 03/30/2021   Screening for cervical cancer 03/18/2021   Ear pain, bilateral 03/17/2021   Degenerative disc disease, cervical 10/13/2020   Chronic, continuous use of opioids 09/10/2020   Rhinitis, chronic 06/10/2020   Restless legs syndrome (RLS) 01/08/2020   Osteoporosis 10/10/2019   COPD  GOLD 3  07/08/2019   Moderate protein-calorie malnutrition ([place]) 05/31/2018   Hypertension 06/08/2017   Left knee pain 03/15/2015  Healthcare maintenance 07/06/2011   Hyperlipidemia 06/03/2009   WEIGHT LOSS 06/03/2009   Human immunodeficiency virus (HIV) disease ([place]) 10/29/2008   GERD 10/29/2008   CEREBROVASCULAR ACCIDENT, HX OF 10/29/2008   TOBACCO DEPENDENCE 02/14/2007   Allergic rhinitis 02/14/2007   Symptoms concerning nutrition, metabolism, and development 02/14/2007     Health Maintenance Due  Topic Date Due   Zoster Vaccines- Shingrix (1 of 2) Never done   COVID-19 Vaccine (4 - Booster for Moderna series) 09/29/2020   TETANUS/TDAP  06/06/2022     Review of  Systems 12 point ros is negative except blurry vision from cataracts. Physical Exam   BP 120/80   Pulse 95   Temp 98.5 F (36.9 C) (Oral)   Ht '5\' 4"'$  (1.626 m)   Wt 118 lb (53.5 kg)   SpO2 100%   BMI 20.25 kg/m   Physical Exam  Constitutional:  oriented to person, place, and time. appears well-developed and well-nourished. No distress.  HENT: [place]/AT, PERRLA, no scleral icterus Mouth/Throat: Oropharynx is clear and moist. No oropharyngeal exudate.  Cardiovascular: Normal rate, regular rhythm and normal heart sounds. Exam reveals no gallop and no friction rub.  No murmur heard.  Pulmonary/Chest: Effort normal and breath sounds normal. No respiratory distress.  has no wheezes.  Neck = supple, no nuchal rigidity Abdominal: Soft. Bowel sounds are normal.  exhibits no distension. There is no tenderness.  Lymphadenopathy: no cervical adenopathy. No axillary adenopathy Neurological: alert and oriented to person, place, and time.  Skin: Skin is warm and dry. No rash noted. No erythema.  Psychiatric: a normal mood and affect.  behavior is normal.   Lab Results  Component Value Date   CD4TCELL 44 01/30/2022   Lab Results  Component Value Date   CD4TABS 899 08/01/2021   CD4TABS 682 03/07/2021   CD4TABS 886 09/22/2020   Lab Results  Component Value Date   HIV1RNAQUANT 36 (H) 01/30/2022   Lab Results  Component Value Date   HEPBSAB NEG 12/28/2014   Lab Results  Component Value Date   LABRPR NON-REACTIVE 01/30/2022    CBC Lab Results  Component Value Date   WBC 6.2 01/30/2022   RBC 3.54 (L) 01/30/2022   HGB 11.7 01/30/2022   HCT 34.4 (L) 01/30/2022   PLT 351 01/30/2022   MCV 97.2 01/30/2022   MCH 33.1 (H) 01/30/2022   MCHC 34.0 01/30/2022   RDW 12.5 01/30/2022   LYMPHSABS 2,009 01/30/2022   MONOABS 1.0 12/08/2021   EOSABS 37 01/30/2022    BMET Lab Results  Component Value Date   NA 137 01/30/2022   K 4.0 01/30/2022   CL 102 01/30/2022   CO2 25 01/30/2022    GLUCOSE 89 01/30/2022   BUN 13 01/30/2022   CREATININE 1.22 (H) 01/30/2022   CALCIUM 9.7 01/30/2022   GFRNONAA >60 12/08/2021   GFRAA 65 03/07/2021      Assessment and Plan  Hiv disease =well controlled  Long term meds = will check cr and ua  Seasonal allergy = continue with allergy shots to help with symptoms  Appetite/insomnia = will do a trial of remeron  Health maintenance = had mammogram and it "was good"; need in the flu, bivalent covid in fall. Pneumococcal in 2024.

## 2022-06-20 DIAGNOSIS — B20 Human immunodeficiency virus [HIV] disease: Secondary | ICD-10-CM | POA: Diagnosis not present

## 2022-06-20 LAB — BASIC METABOLIC PANEL
BUN/Creatinine Ratio: 9 (calc) (ref 6–22)
BUN: 12 mg/dL (ref 7–25)
CO2: 24 mmol/L (ref 20–32)
Calcium: 9.7 mg/dL (ref 8.6–10.4)
Chloride: 105 mmol/L (ref 98–110)
Creat: 1.31 mg/dL — ABNORMAL HIGH (ref 0.50–1.05)
Glucose, Bld: 94 mg/dL (ref 65–99)
Potassium: 4.1 mmol/L (ref 3.5–5.3)
Sodium: 139 mmol/L (ref 135–146)

## 2022-06-20 LAB — MICROALBUMIN / CREATININE URINE RATIO
Creatinine, Urine: 400 mg/dL — ABNORMAL HIGH (ref 20–275)
Microalb Creat Ratio: 7 mcg/mg creat (ref <30)
Microalb, Ur: 2.8 mg/dL

## 2022-06-21 DIAGNOSIS — B20 Human immunodeficiency virus [HIV] disease: Secondary | ICD-10-CM | POA: Diagnosis not present

## 2022-06-22 ENCOUNTER — Other Ambulatory Visit: Payer: Self-pay | Admitting: Sports Medicine

## 2022-06-22 DIAGNOSIS — F1190 Opioid use, unspecified, uncomplicated: Secondary | ICD-10-CM

## 2022-06-22 DIAGNOSIS — B20 Human immunodeficiency virus [HIV] disease: Secondary | ICD-10-CM | POA: Diagnosis not present

## 2022-06-23 ENCOUNTER — Other Ambulatory Visit: Payer: Self-pay | Admitting: Sports Medicine

## 2022-06-23 DIAGNOSIS — B20 Human immunodeficiency virus [HIV] disease: Secondary | ICD-10-CM | POA: Diagnosis not present

## 2022-06-23 DIAGNOSIS — F1190 Opioid use, unspecified, uncomplicated: Secondary | ICD-10-CM

## 2022-06-23 MED ORDER — TRAMADOL HCL 50 MG PO TABS: 50.0000 mg | ORAL_TABLET | Freq: Four times a day (QID) | ORAL | 0 refills | Status: DC | PRN

## 2022-06-26 ENCOUNTER — Other Ambulatory Visit (HOSPITAL_COMMUNITY): Payer: Self-pay

## 2022-06-26 DIAGNOSIS — B20 Human immunodeficiency virus [HIV] disease: Secondary | ICD-10-CM | POA: Diagnosis not present

## 2022-06-27 DIAGNOSIS — Z7689 Persons encountering health services in other specified circumstances: Secondary | ICD-10-CM | POA: Diagnosis not present

## 2022-06-27 DIAGNOSIS — M21371 Foot drop, right foot: Secondary | ICD-10-CM | POA: Diagnosis not present

## 2022-06-27 DIAGNOSIS — B20 Human immunodeficiency virus [HIV] disease: Secondary | ICD-10-CM | POA: Diagnosis not present

## 2022-06-28 ENCOUNTER — Encounter: Payer: Self-pay | Admitting: Student

## 2022-06-28 ENCOUNTER — Other Ambulatory Visit: Payer: Self-pay

## 2022-06-28 ENCOUNTER — Ambulatory Visit (INDEPENDENT_AMBULATORY_CARE_PROVIDER_SITE_OTHER): Payer: Medicaid Other | Admitting: Student

## 2022-06-28 VITALS — BP 111/87 | HR 99 | Temp 98.5°F | Resp 24 | Ht 63.0 in | Wt 117.7 lb

## 2022-06-28 DIAGNOSIS — J310 Chronic rhinitis: Secondary | ICD-10-CM

## 2022-06-28 DIAGNOSIS — B20 Human immunodeficiency virus [HIV] disease: Secondary | ICD-10-CM | POA: Diagnosis not present

## 2022-06-28 DIAGNOSIS — R634 Abnormal weight loss: Secondary | ICD-10-CM

## 2022-06-28 DIAGNOSIS — M25472 Effusion, left ankle: Secondary | ICD-10-CM | POA: Diagnosis not present

## 2022-06-28 DIAGNOSIS — Z7689 Persons encountering health services in other specified circumstances: Secondary | ICD-10-CM | POA: Diagnosis not present

## 2022-06-28 LAB — CBC WITH DIFFERENTIAL/PLATELET
Abs Immature Granulocytes: 0.02 10*3/uL (ref 0.00–0.07)
Basophils Absolute: 0 10*3/uL (ref 0.0–0.1)
Basophils Relative: 1 %
Eosinophils Absolute: 0.1 10*3/uL (ref 0.0–0.5)
Eosinophils Relative: 1 %
HCT: 36.3 % (ref 36.0–46.0)
Hemoglobin: 12.1 g/dL (ref 12.0–15.0)
Immature Granulocytes: 0 %
Lymphocytes Relative: 36 %
Lymphs Abs: 2 10*3/uL (ref 0.7–4.0)
MCH: 32.5 pg (ref 26.0–34.0)
MCHC: 33.3 g/dL (ref 30.0–36.0)
MCV: 97.6 fL (ref 80.0–100.0)
Monocytes Absolute: 0.6 10*3/uL (ref 0.1–1.0)
Monocytes Relative: 10 %
Neutro Abs: 2.8 10*3/uL (ref 1.7–7.7)
Neutrophils Relative %: 52 %
Platelets: 377 10*3/uL (ref 150–400)
RBC: 3.72 MIL/uL — ABNORMAL LOW (ref 3.87–5.11)
RDW: 13.2 % (ref 11.5–15.5)
WBC: 5.4 10*3/uL (ref 4.0–10.5)
nRBC: 0 % (ref 0.0–0.2)

## 2022-06-28 LAB — COMPREHENSIVE METABOLIC PANEL
ALT: 8 U/L (ref 0–44)
AST: 20 U/L (ref 15–41)
Albumin: 3.9 g/dL (ref 3.5–5.0)
Alkaline Phosphatase: 66 U/L (ref 38–126)
Anion gap: 12 (ref 5–15)
BUN: 9 mg/dL (ref 8–23)
CO2: 23 mmol/L (ref 22–32)
Calcium: 9.6 mg/dL (ref 8.9–10.3)
Chloride: 100 mmol/L (ref 98–111)
Creatinine, Ser: 1.19 mg/dL — ABNORMAL HIGH (ref 0.44–1.00)
GFR, Estimated: 52 mL/min — ABNORMAL LOW (ref >60)
Glucose, Bld: 104 mg/dL — ABNORMAL HIGH (ref 70–99)
Potassium: 4.4 mmol/L (ref 3.5–5.1)
Sodium: 135 mmol/L (ref 135–145)
Total Bilirubin: 0.6 mg/dL (ref 0.3–1.2)
Total Protein: 7.9 g/dL (ref 6.5–8.1)

## 2022-06-28 LAB — SEDIMENTATION RATE: Sed Rate: 89 mm/hr — ABNORMAL HIGH (ref 0–22)

## 2022-06-28 LAB — LACTATE DEHYDROGENASE: LDH: 155 U/L (ref 98–192)

## 2022-06-28 LAB — C-REACTIVE PROTEIN: CRP: 0.6 mg/dL (ref <1.0)

## 2022-06-28 LAB — TSH: TSH: 1.876 u[IU]/mL (ref 0.350–4.500)

## 2022-06-28 NOTE — Progress Notes (Signed)
CC: Left ankle swelling, unintentional weight loss, lack of appetite  HPI:  [name] is a 61 y.o. female living with a history stated below and presents today for left ankle swelling, weight loss, and lack of appetite. Please see problem based assessment and plan for additional details.  Past Medical History:  Diagnosis Date   Anemia    Arthritis    generalized   Blood transfusion without reported diagnosis 2003   Bronchitis    Cataract    COPD (chronic obstructive pulmonary disease) ([place])    COPD with acute exacerbation ([place]) 06/09/2017   Dental abscess 03/15/2015   DYSPEPSIA 02/14/2007   Qualifier: Diagnosis of  By: Samara Snide     EXTERNAL OTITIS 01/06/2010   Qualifier: Diagnosis of  By: Tomma Lightning MD, Claiborne Billings     FACIAL [place] 02/04/2009   Qualifier: Diagnosis of  By: Rhunette Croft     Former smoker    quit 2014   GERD (gastroesophageal reflux disease)    Glaucoma    HIV infection ([place])    Hypertension    Osteoporosis    Seasonal allergies    Stroke ([place]) 2003   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 18 g 1   amLODipine (NORVASC) 10 MG tablet Take 1 tablet by mouth once daily 90 tablet 3   bictegravir-emtricitabine-tenofovir AF (BIKTARVY) 50-200-25 MG TABS tablet Take 1 tablet by mouth daily. 30 tablet 4   budesonide-formoterol (SYMBICORT) 80-4.5 MCG/ACT inhaler Inhale 2 puffs into the lungs daily. 1 each 4   Ensure (ENSURE) Take 237 mLs by mouth 3 (three) times daily between meals. 237 mL 5   EPIPEN 2-PAK 0.3 MG/0.3ML SOAJ injection      EYSUVIS 0.25 % SUSP Apply 1 drop to eye daily at 6 (six) AM.     fluticasone (FLONASE) 50 MCG/ACT nasal spray Use 1 spray(s) in  each nostril once daily 16 g 0   ketorolac (ACULAR) 0.5 % ophthalmic solution Place 1 drop into the right eye 4 (four) times daily.     mirtazapine (REMERON) 7.5 MG tablet Take 1 tablet (7.5 mg total) by mouth at bedtime. 30 tablet 3   ofloxacin (OCUFLOX) 0.3 % ophthalmic solution Place 1 drop into the right eye 4 (four) times daily.     omeprazole (PRILOSEC) 20 MG capsule Take 1 capsule (20 mg total) by mouth daily. 90 capsule 5   pravastatin (PRAVACHOL) 40 MG tablet Take 1 tablet by mouth once daily 90 tablet 1   prednisoLONE acetate (PRED FORTE) 1 % ophthalmic suspension Place 1 drop into the right eye 4 (four) times daily. Until next wednesday     PRESCRIPTION MEDICATION Inject 1 Dose into the skin once a week. Allergy shot in office on Fridays     RESTASIS 0.05 % ophthalmic emulsion 1 drop 2 (two) times daily.     sorbitol 70 % SOLN SMARTSIG:15 Milliliter(s) By Mouth Daily PRN     sorbitol 70 % solution Take 15 mLs by mouth daily as needed. 473 mL 1   SYMBICORT 80-4.5 MCG/ACT inhaler Inhale 2 puffs by mouth twice daily 11 g 5   traMADol (ULTRAM) 50  MG tablet Take 1 tablet (50 mg total) by mouth every 6 (six) hours as needed. 60 tablet 0   No current facility-administered medications on file prior to visit.   Review of Systems: ROS negative except for what is noted on the assessment and plan.  Vitals:   06/28/22 1034  BP: 111/87  Pulse: 99  Resp: (!) 24  Temp: 98.5 F (36.9 C)  TempSrc: Oral  SpO2: 100%  Weight: 117 lb 11.2 oz (53.4 kg)  Height: '5\' 3"'  (1.6 m)    Physical Exam Constitutional:      General: She is not in acute distress.    Appearance: Normal appearance.  HENT:     Head: Normocephalic and atraumatic.  Cardiovascular:     Rate and Rhythm: Normal rate and regular rhythm.  Pulmonary:     Effort: Pulmonary effort is normal.     Breath sounds: Normal breath sounds.  Musculoskeletal:     Comments: L Foot: minimal swelling, no erythema, non-tender, normal ROM   Skin:    General: Skin is warm and dry.  Neurological:     Mental Status: She is alert.  Psychiatric:        Mood and Affect: Mood normal.        Behavior: Behavior normal.      Assessment & Plan:   WEIGHT LOSS Patient presents today with continued loss of appetite.  She has lost 10 pounds since May. She was on Megace with good results, but was then discontinued.  She was recently given a prescription for mirtazapine from Dr. Baxter Flattery, but did not start it. I advised her to start it and see if it helps with her appetite.  Denies abdominal pain, constipation, tarry or bright red stools. Start workup for unintentional weight loss.   Plan -Labs today: CMP, CBC, TSH, LDH, ESR, CRP, serum protein electrophoresis, free light chains -Chest x-ray   Ankle swelling, left Patient reports left foot swelling that started 2 weeks ago. Swelling along the medial side of foot, close to the medial malleolus. Denies any recent injury or trauma. Pt reports the swelling has improved. On exam, minimal swelling, non-erythematous, non-tender and normal ROM.   Plan -swelling is improving with no disruption to her walking or daily function  Rhinitis, chronic Patient reports that Claritin is not helping.  She is also taking Flonase. She reports that a referral to ENT was made previously.   Plan -Recommended switch to another oral antihistamine and assess for improvement -continue Flonase   Patient seen with Dr. Isabella Stalling, D.O. [facility], PGY-1 Phone: [phone] Date 06/28/2022 Time 5:39 PM

## 2022-06-28 NOTE — Patient Instructions (Addendum)
Thank you, Ms.[name] for allowing [place] to provide your care today. Today we discussed:  -Left foot swelling: Your left foot has minimal swelling and appears to be improving. -Weight loss: We will do labs today and I will call you with the results.  Please start the mirtazapine that Dr. Baxter Flattery prescribed. -Sinus congestion: Consider switching to another over-the-counter antihistamine. Continue with your Flonase.  I have ordered the following labs for you:  Lab Orders         CMP14 + Anion Gap         CBC with Diff         TSH         LDH         Serum protein electrophoresis with reflex         CRP (C-Reactive Protein)         Sed Rate (ESR)         Kappa/lambda light chains      Imaging: Chest X-ray  Referrals ordered today:   Referral Orders  No referral(s) requested today     I have ordered the following medication/changed the following medications:   Stop the following medications: There are no discontinued medications.   Start the following medications: No orders of the defined types were placed in this encounter.    Follow up:  6 weeks     Should you have any questions or concerns please call the internal medicine clinic at [phone].    Angelique Blonder, D.O. [place]

## 2022-06-28 NOTE — Assessment & Plan Note (Addendum)
Patient presents today with continued loss of appetite.  She has lost 10 pounds since May. She was on Megace with good results, but was then discontinued.  She was recently given a prescription for mirtazapine from Dr. Baxter Flattery, but did not start it. I advised her to start it and see if it helps with her appetite.  Denies abdominal pain, constipation, tarry or bright red stools. Start workup for unintentional weight loss.   Plan -Labs today: CMP, CBC, TSH, LDH, ESR, CRP, serum protein electrophoresis, free light chains -Chest x-ray

## 2022-06-28 NOTE — Assessment & Plan Note (Signed)
Patient reports left foot swelling that started 2 weeks ago. Swelling along the medial side of foot, close to the medial malleolus. Denies any recent injury or trauma. Pt reports the swelling has improved. On exam, minimal swelling, non-erythematous, non-tender and normal ROM.   Plan -swelling is improving with no disruption to her walking or daily function

## 2022-06-28 NOTE — Assessment & Plan Note (Signed)
Patient reports that Claritin is not helping.  She is also taking Flonase. She reports that a referral to ENT was made previously.   Plan -Recommended switch to another oral antihistamine and assess for improvement -continue Flonase

## 2022-06-29 DIAGNOSIS — B20 Human immunodeficiency virus [HIV] disease: Secondary | ICD-10-CM | POA: Diagnosis not present

## 2022-06-29 NOTE — Progress Notes (Signed)
Internal Medicine Clinic Attending  I saw and evaluated the patient.  I personally confirmed the key portions of the history and exam documented by Dr. Markus Jarvis and I reviewed pertinent patient test results.  The assessment, diagnosis, and plan were formulated together and I agree with the documentation in the resident's note.   Unintentional weightloss for several months in an older person, but otherwise feeling well today. No signs of symptoms to point towards either infectious, inflammatory, or malignant process. Initial labs so far reassuring with no electrolyte abnormalities. She seems to be well hydrated and good functional status. Will await rest of workup including chest xray and serum IFE/light chains. Follow up in 4-6 weeks for weight recheck and will focus further testing based on that.

## 2022-06-30 DIAGNOSIS — J3089 Other allergic rhinitis: Secondary | ICD-10-CM | POA: Diagnosis not present

## 2022-06-30 DIAGNOSIS — Z7689 Persons encountering health services in other specified circumstances: Secondary | ICD-10-CM | POA: Diagnosis not present

## 2022-06-30 DIAGNOSIS — J3081 Allergic rhinitis due to animal (cat) (dog) hair and dander: Secondary | ICD-10-CM | POA: Diagnosis not present

## 2022-06-30 DIAGNOSIS — B20 Human immunodeficiency virus [HIV] disease: Secondary | ICD-10-CM | POA: Diagnosis not present

## 2022-06-30 DIAGNOSIS — J301 Allergic rhinitis due to pollen: Secondary | ICD-10-CM | POA: Diagnosis not present

## 2022-07-03 DIAGNOSIS — B20 Human immunodeficiency virus [HIV] disease: Secondary | ICD-10-CM | POA: Diagnosis not present

## 2022-07-03 LAB — MISC LABCORP TEST (SEND OUT): Labcorp test code: 123041

## 2022-07-04 DIAGNOSIS — B20 Human immunodeficiency virus [HIV] disease: Secondary | ICD-10-CM | POA: Diagnosis not present

## 2022-07-05 DIAGNOSIS — B20 Human immunodeficiency virus [HIV] disease: Secondary | ICD-10-CM | POA: Diagnosis not present

## 2022-07-06 ENCOUNTER — Other Ambulatory Visit: Payer: Self-pay

## 2022-07-06 DIAGNOSIS — B20 Human immunodeficiency virus [HIV] disease: Secondary | ICD-10-CM | POA: Diagnosis not present

## 2022-07-06 MED ORDER — ALBUTEROL SULFATE HFA 108 (90 BASE) MCG/ACT IN AERS: 2.0000 | INHALATION_SPRAY | Freq: Four times a day (QID) | RESPIRATORY_TRACT | 1 refills | Status: DC | PRN

## 2022-07-07 DIAGNOSIS — B20 Human immunodeficiency virus [HIV] disease: Secondary | ICD-10-CM | POA: Diagnosis not present

## 2022-07-10 DIAGNOSIS — B20 Human immunodeficiency virus [HIV] disease: Secondary | ICD-10-CM | POA: Diagnosis not present

## 2022-07-11 DIAGNOSIS — B20 Human immunodeficiency virus [HIV] disease: Secondary | ICD-10-CM | POA: Diagnosis not present

## 2022-07-12 DIAGNOSIS — B20 Human immunodeficiency virus [HIV] disease: Secondary | ICD-10-CM | POA: Diagnosis not present

## 2022-07-13 DIAGNOSIS — B20 Human immunodeficiency virus [HIV] disease: Secondary | ICD-10-CM | POA: Diagnosis not present

## 2022-07-14 ENCOUNTER — Ambulatory Visit
Admission: EM | Admit: 2022-07-14 | Discharge: 2022-07-14 | Disposition: A | Discharge status: Home / Self Care | Payer: Medicaid Other | Attending: Physician Assistant | Admitting: Physician Assistant

## 2022-07-14 DIAGNOSIS — R051 Acute cough: Secondary | ICD-10-CM | POA: Diagnosis not present

## 2022-07-14 DIAGNOSIS — J441 Chronic obstructive pulmonary disease with (acute) exacerbation: Secondary | ICD-10-CM | POA: Diagnosis not present

## 2022-07-14 DIAGNOSIS — B20 Human immunodeficiency virus [HIV] disease: Secondary | ICD-10-CM | POA: Diagnosis not present

## 2022-07-14 DIAGNOSIS — R110 Nausea: Secondary | ICD-10-CM

## 2022-07-14 MED ORDER — BENZONATATE 100 MG PO CAPS: 100.0000 mg | ORAL_CAPSULE | Freq: Three times a day (TID) | ORAL | 0 refills | Status: DC

## 2022-07-14 MED ORDER — METHYLPREDNISOLONE 4 MG PO TBPK: ORAL_TABLET | ORAL | 0 refills | Status: DC

## 2022-07-14 MED ORDER — DOXYCYCLINE HYCLATE 100 MG PO CAPS: 100.0000 mg | ORAL_CAPSULE | Freq: Two times a day (BID) | ORAL | 0 refills | Status: DC

## 2022-07-14 MED ORDER — ONDANSETRON 4 MG PO TBDP: 4.0000 mg | ORAL_TABLET | Freq: Three times a day (TID) | ORAL | 0 refills | Status: DC | PRN

## 2022-07-14 NOTE — ED Provider Notes (Signed)
[facility]    CSN: [id number] Arrival date & time: 07/14/22  1016      History   Chief Complaint Chief Complaint  Patient presents with   sinus pressure    HPI Janice Williams is a 61 y.o. female.   Patient presents today with a 6-day history of worsening cough.  She reports that the nasal congestion, sinus pressure, drainage, nausea, shortness of breath.  Denies any chest pain, fever, vomiting.  She is eating and drinking normally.  She has a history of COPD and has been using her inhaler as prescribed.  She has required more regular use of her albuterol due to associated shortness of breath.  She did take an at-home COVID test that was negative.  She denies history of diabetes.  Denies any recent antibiotic or steroid use.  Reports that she does well with a Medrol Dosepak and is requesting this if steroids are required.  She has been using Tylenol over-the-counter without improvement of symptoms.  She is having difficulty with daily duties as a result of symptoms.    Past Medical History:  Diagnosis Date   Anemia    Arthritis    generalized   Blood transfusion without reported diagnosis 2003   Bronchitis    Cataract    COPD (chronic obstructive pulmonary disease) ([place])    COPD with acute exacerbation ([place]) 06/09/2017   Dental abscess 03/15/2015   DYSPEPSIA 02/14/2007   Qualifier: Diagnosis of  By: Samara Snide     EXTERNAL OTITIS 01/06/2010   Qualifier: Diagnosis of  By: Tomma Lightning MD, [place] 02/04/2009   Qualifier: Diagnosis of  By: Rhunette Croft     Former smoker    quit 2014   GERD (gastroesophageal reflux disease)    Glaucoma    HIV infection ([place])    Hypertension    Osteoporosis    Seasonal allergies    Stroke ([place]) 2003   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, clean 7 years   SVD (spontaneous vaginal delivery)    x 3    Patient Active Problem List   Diagnosis Date Noted   Ankle swelling, left 06/28/2022   Sprain of calcaneofibular  ligament 02/28/2022   Need for shingles vaccine 08/14/2021   Constipation 05/12/2021   Cough 03/30/2021   Screening for cervical cancer 03/18/2021   Ear pain, bilateral 03/17/2021   Degenerative disc disease, cervical 10/13/2020   Chronic, continuous use of opioids 09/10/2020   Rhinitis, chronic 06/10/2020   Restless legs syndrome (RLS) 01/08/2020   Osteoporosis 10/10/2019   COPD  GOLD 3  07/08/2019   Moderate protein-calorie malnutrition ([place]) 05/31/2018   Hypertension 06/08/2017   Left knee pain 03/15/2015   Healthcare maintenance 07/06/2011   Hyperlipidemia 06/03/2009   WEIGHT LOSS 06/03/2009   Human immunodeficiency virus (HIV) disease ([place]) 10/29/2008   GERD 10/29/2008   CEREBROVASCULAR ACCIDENT, HX OF 10/29/2008   TOBACCO DEPENDENCE 02/14/2007   Allergic rhinitis 02/14/2007   Symptoms concerning nutrition, metabolism, and development 02/14/2007    Past Surgical History:  Procedure Laterality Date   COLONOSCOPY  2019   KN-MAC-suprep(adeq)-tics/hems/TA x 5   MULTIPLE TOOTH EXTRACTIONS     with sedation   POLYPECTOMY  01/2008   TA x 5   TONSILLECTOMY  1973   TUBAL LIGATION     UPPER GI ENDOSCOPY     normal per patient - "years ago"    OB History     Gravida  3  Para  3   Term  3   Preterm      AB      Living  3      SAB      IAB      Ectopic      Multiple      Live Births  3            Home Medications    Prior to Admission medications   Medication Sig Start Date End Date Taking? Authorizing Provider  benzonatate (TESSALON) 100 MG capsule Take 1 capsule (100 mg total) by mouth every 8 (eight) hours. 07/14/22  Yes Jakia Kennebrew K, PA-C  doxycycline (VIBRAMYCIN) 100 MG capsule Take 1 capsule (100 mg total) by mouth 2 (two) times daily. 07/14/22  Yes Nashika Coker, Junie Panning K, PA-C  methylPREDNISolone (MEDROL DOSEPAK) 4 MG TBPK tablet As directed 07/14/22  Yes Joniya Boberg K, PA-C  ondansetron (ZOFRAN-ODT) 4 MG disintegrating tablet Take 1 tablet (4 mg  total) by mouth every 8 (eight) hours as needed for nausea or vomiting. 07/14/22  Yes Chardai Gangemi K, PA-C  albuterol (VENTOLIN HFA) 108 (90 Base) MCG/ACT inhaler Inhale 2 puffs into the lungs every 6 (six) hours as needed for wheezing or shortness of breath. 07/06/22   Gaylan Gerold, DO  amLODipine (NORVASC) 10 MG tablet Take 1 tablet by mouth once daily 01/26/22   Madalyn Rob, MD  bictegravir-emtricitabine-tenofovir AF (BIKTARVY) 50-200-25 MG TABS tablet Take 1 tablet by mouth daily. 04/24/22   Golden Circle, FNP  budesonide-formoterol (SYMBICORT) 80-4.5 MCG/ACT inhaler Inhale 2 puffs into the lungs daily. 04/11/22   Tanda Rockers, MD  Ensure (ENSURE) Take 237 mLs by mouth 3 (three) times daily between meals. 05/24/22   [place] Callas, NP  EPIPEN 2-PAK 0.3 MG/0.3ML SOAJ injection  03/21/22   [provider]  EYSUVIS 0.25 % SUSP Apply 1 drop to eye daily at 6 (six) AM. 03/21/22   [provider]  fluticasone (FLONASE) 50 MCG/ACT nasal spray Use 1 spray(s) in each nostril once daily 06/12/22   Virl Axe, MD  ketorolac (ACULAR) 0.5 % ophthalmic solution Place 1 drop into the right eye 4 (four) times daily. 05/29/22   [provider]  mirtazapine (REMERON) 7.5 MG tablet Take 1 tablet (7.5 mg total) by mouth at bedtime. 06/19/22   Carlyle Basques, MD  ofloxacin (OCUFLOX) 0.3 % ophthalmic solution Place 1 drop into the right eye 4 (four) times daily. 06/14/22   [provider]  omeprazole (PRILOSEC) 20 MG capsule Take 1 capsule (20 mg total) by mouth daily. 03/13/22   Madalyn Rob, MD  pravastatin (PRAVACHOL) 40 MG tablet Take 1 tablet by mouth once daily 04/11/22   Madalyn Rob, MD  prednisoLONE acetate (PRED FORTE) 1 % ophthalmic suspension Place 1 drop into the right eye 4 (four) times daily. Until next wednesday 05/29/22   [provider]  PRESCRIPTION MEDICATION Inject 1 Dose into the skin once a week. Allergy shot in office on Fridays    [provider]  RESTASIS 0.05 % ophthalmic emulsion 1 drop 2 (two) times daily. 08/05/21   [provider]  sorbitol 70 % SOLN SMARTSIG:15 Milliliter(s) By Mouth Daily PRN 03/21/22   [provider]  sorbitol 70 % solution Take 15 mLs by mouth daily as needed. 10/14/21   Madalyn Rob, MD  SYMBICORT 80-4.5 MCG/ACT inhaler Inhale 2 puffs by mouth twice daily 04/12/22   Tanda Rockers, MD  traMADol Veatrice Bourbon) 50  MG tablet Take 1 tablet (50 mg total) by mouth every 6 (six) hours as needed. 06/23/22   Thurman Coyer, DO    Family History Family History  Problem Relation Age of Onset   Hypertension Mother    Hypertension Father    Hyperlipidemia Father    COPD Father    Atrial fibrillation Father    Colon cancer Neg Hx    Rectal cancer Neg Hx    Stomach cancer Neg Hx    Colon polyps Neg Hx    Esophageal cancer Neg Hx     Social History Social History   Tobacco Use   Smoking status: Former    Packs/day: 0.40    Years: 38.00    Total pack years: 15.20    Types: Cigarettes    Start date: 12/18/2012    Quit date: 11/21/2013    Years since quitting: 8.6   Smokeless tobacco: Never  Vaping Use   Vaping Use: Never used  Substance Use Topics   Alcohol use: Not Currently   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     Allergies   Chantix [varenicline]   Review of Systems Review of Systems  Constitutional:  Positive for activity change and fatigue. Negative for appetite change and fever.  HENT:  Positive for congestion and sinus pressure. Negative for sneezing and sore throat.   Respiratory:  Positive for cough and shortness of breath.   Cardiovascular:  Negative for chest pain.  Gastrointestinal:  Positive for nausea. Negative for abdominal pain, diarrhea and vomiting.  Neurological:  Positive for headaches. Negative for dizziness and light-headedness.     Physical Exam Triage Vital Signs ED Triage Vitals [07/14/22 1023]  Enc Vitals Group     BP 110/80     Pulse  Rate 75     Resp 18     Temp 98 F (36.7 C)     Temp Source Oral     SpO2 97 %     Weight      Height      Head Circumference      Peak Flow      Pain Score 0     Pain Loc      Pain Edu?      Excl. in [place]?    No data found.  Updated Vital Signs BP 110/80 (BP Location: Left Arm)   Pulse 75   Temp 98 F (36.7 C) (Oral)   Resp 18   SpO2 97%   Visual Acuity Right Eye Distance:   Left Eye Distance:   Bilateral Distance:    Right Eye Near:   Left Eye Near:    Bilateral Near:     Physical Exam Vitals reviewed.  Constitutional:      General: She is awake. She is not in acute distress.    Appearance: Normal appearance. She is well-developed. She is not ill-appearing.     Comments: Very pleasant female appears stated age in no acute distress sitting comfortably in exam room  HENT:     Head: Normocephalic and atraumatic.     Right Ear: Tympanic membrane, ear canal and external ear normal. Tympanic membrane is not erythematous or bulging.     Left Ear: Tympanic membrane, ear canal and external ear normal. Tympanic membrane is not erythematous or bulging.     Nose:     Right Sinus: Maxillary sinus tenderness present. No frontal sinus tenderness.     Left Sinus:  Maxillary sinus tenderness present. No frontal sinus tenderness.     Mouth/Throat:     Pharynx: Uvula midline. No oropharyngeal exudate or posterior oropharyngeal erythema.  Cardiovascular:     Rate and Rhythm: Normal rate and regular rhythm.     Heart sounds: Normal heart sounds, S1 normal and S2 normal. No murmur heard. Pulmonary:     Effort: Pulmonary effort is normal.     Breath sounds: Rhonchi present. No wheezing or rales.     Comments: Scattered rhonchi clear with cough Psychiatric:        Behavior: Behavior is cooperative.      UC Treatments / Results  Labs (all labs ordered are listed, but only abnormal results are displayed) Labs Reviewed - No data to display  EKG   Radiology No results  found.  Procedures Procedures (including critical care time)  Medications Ordered in UC Medications - No data to display  Initial Impression / Assessment and Plan / UC Course  I have reviewed the triage vital signs and the nursing notes.  Pertinent labs & imaging results that were available during my care of the patient were reviewed by me and considered in my medical decision making (see chart for details).     Concern for COPD exacerbation given clinical presentation.  X-ray was deferred as patient's oxygen saturation was 97%.  We will start doxycycline 100 mg twice daily for 10 days.  She was instructed to stay out of the sun while on this medication due to positivity associated with this medication.  Encouraged her to use over-the-counter medications including Mucinex, Flonase, Tylenol for symptom relief.  She was given Zofran as well as Tessalon for symptomatic relief.  We will start Medrol Dosepak.  She was instructed not to take NSAIDs with this medication due to risk of GI bleeding.  She can continue her inhaler regimen as previously prescribed.  Recommended close follow-up with her primary care.  Discussed that if she has any worsening symptoms including shortness of breath, increased use of inhaler, worsening cough, fever, chest pain, nausea/vomiting she needs to be seen immediately.  Strict return precautions given.  Final Clinical Impressions(s) / UC Diagnoses   Final diagnoses:  COPD exacerbation (HCC)  Acute cough  Nausea without vomiting     Discharge Instructions      Start doxycycline 100 mg twice daily for 10 days.  Stay out of the sun while on this medication.  Start Medrol Dosepak.  Do not take NSAIDs including aspirin, ibuprofen/Advil, naproxen/Aleve with this medication.  Use Tessalon for cough.  Use Zofran (ondansetron) for nausea.  Use Mucinex, Flonase, Tylenol for additional symptom relief.  Make sure you rest and drink plenty of fluid.  Follow-up with your  primary care next week.  If you have any worsening symptoms including increased use of her rescue inhaler, worsening cough, fever, shortness of breath, chest pain, nausea/vomiting you need to go to the emergency room immediately.     ED Prescriptions     Medication Sig Dispense Auth. Provider   doxycycline (VIBRAMYCIN) 100 MG capsule Take 1 capsule (100 mg total) by mouth 2 (two) times daily. 20 capsule Valerye Kobus K, PA-C   methylPREDNISolone (MEDROL DOSEPAK) 4 MG TBPK tablet As directed 21 tablet Darin Redmann K, PA-C   benzonatate (TESSALON) 100 MG capsule Take 1 capsule (100 mg total) by mouth every 8 (eight) hours. 21 capsule Maliyah Willets K, PA-C   ondansetron (ZOFRAN-ODT) 4 MG disintegrating tablet Take 1 tablet (4 mg total)  by mouth every 8 (eight) hours as needed for nausea or vomiting. 20 tablet Kavish Lafitte, Derry Skill, PA-C      PDMP not reviewed this encounter.   Terrilee Croak, PA-C 07/14/22 1037

## 2022-07-14 NOTE — ED Triage Notes (Signed)
Pt c/o facial pressure, nasal congestion, ear aches, onset ~ Tuesday

## 2022-07-14 NOTE — Discharge Instructions (Signed)
Start doxycycline 100 mg twice daily for 10 days.  Stay out of the sun while on this medication.  Start Medrol Dosepak.  Do not take NSAIDs including aspirin, ibuprofen/Advil, naproxen/Aleve with this medication.  Use Tessalon for cough.  Use Zofran (ondansetron) for nausea.  Use Mucinex, Flonase, Tylenol for additional symptom relief.  Make sure you rest and drink plenty of fluid.  Follow-up with your primary care next week.  If you have any worsening symptoms including increased use of her rescue inhaler, worsening cough, fever, shortness of breath, chest pain, nausea/vomiting you need to go to the emergency room immediately.

## 2022-07-17 DIAGNOSIS — B20 Human immunodeficiency virus [HIV] disease: Secondary | ICD-10-CM | POA: Diagnosis not present

## 2022-07-18 ENCOUNTER — Other Ambulatory Visit (HOSPITAL_COMMUNITY): Payer: Self-pay

## 2022-07-18 DIAGNOSIS — B20 Human immunodeficiency virus [HIV] disease: Secondary | ICD-10-CM | POA: Diagnosis not present

## 2022-07-18 DIAGNOSIS — Z419 Encounter for procedure for purposes other than remedying health state, unspecified: Secondary | ICD-10-CM | POA: Diagnosis not present

## 2022-07-19 ENCOUNTER — Other Ambulatory Visit (HOSPITAL_COMMUNITY): Payer: Self-pay

## 2022-07-19 DIAGNOSIS — B20 Human immunodeficiency virus [HIV] disease: Secondary | ICD-10-CM | POA: Diagnosis not present

## 2022-07-20 DIAGNOSIS — B20 Human immunodeficiency virus [HIV] disease: Secondary | ICD-10-CM | POA: Diagnosis not present

## 2022-07-21 DIAGNOSIS — B20 Human immunodeficiency virus [HIV] disease: Secondary | ICD-10-CM | POA: Diagnosis not present

## 2022-07-24 ENCOUNTER — Other Ambulatory Visit: Payer: Self-pay | Admitting: Sports Medicine

## 2022-07-24 ENCOUNTER — Other Ambulatory Visit: Payer: Self-pay | Admitting: Student

## 2022-07-24 DIAGNOSIS — F1190 Opioid use, unspecified, uncomplicated: Secondary | ICD-10-CM

## 2022-07-24 DIAGNOSIS — B20 Human immunodeficiency virus [HIV] disease: Secondary | ICD-10-CM | POA: Diagnosis not present

## 2022-07-24 MED ORDER — TRAMADOL HCL 50 MG PO TABS: 50.0000 mg | ORAL_TABLET | Freq: Four times a day (QID) | ORAL | 0 refills | Status: DC | PRN

## 2022-07-24 NOTE — Telephone Encounter (Signed)
Refill Request-    traMADol (ULTRAM) 50 MG tablet  [id number] - [place] ([place]), [place] - [address]

## 2022-07-25 DIAGNOSIS — B20 Human immunodeficiency virus [HIV] disease: Secondary | ICD-10-CM | POA: Diagnosis not present

## 2022-07-26 DIAGNOSIS — B20 Human immunodeficiency virus [HIV] disease: Secondary | ICD-10-CM | POA: Diagnosis not present

## 2022-07-27 DIAGNOSIS — B20 Human immunodeficiency virus [HIV] disease: Secondary | ICD-10-CM | POA: Diagnosis not present

## 2022-07-28 DIAGNOSIS — Z7689 Persons encountering health services in other specified circumstances: Secondary | ICD-10-CM | POA: Diagnosis not present

## 2022-07-28 DIAGNOSIS — J301 Allergic rhinitis due to pollen: Secondary | ICD-10-CM | POA: Diagnosis not present

## 2022-07-28 DIAGNOSIS — J3081 Allergic rhinitis due to animal (cat) (dog) hair and dander: Secondary | ICD-10-CM | POA: Diagnosis not present

## 2022-07-28 DIAGNOSIS — J3089 Other allergic rhinitis: Secondary | ICD-10-CM | POA: Diagnosis not present

## 2022-07-31 DIAGNOSIS — B20 Human immunodeficiency virus [HIV] disease: Secondary | ICD-10-CM | POA: Diagnosis not present

## 2022-08-01 DIAGNOSIS — B20 Human immunodeficiency virus [HIV] disease: Secondary | ICD-10-CM | POA: Diagnosis not present

## 2022-08-02 DIAGNOSIS — B20 Human immunodeficiency virus [HIV] disease: Secondary | ICD-10-CM | POA: Diagnosis not present

## 2022-08-03 DIAGNOSIS — B20 Human immunodeficiency virus [HIV] disease: Secondary | ICD-10-CM | POA: Diagnosis not present

## 2022-08-04 DIAGNOSIS — J3089 Other allergic rhinitis: Secondary | ICD-10-CM | POA: Diagnosis not present

## 2022-08-04 DIAGNOSIS — Z7689 Persons encountering health services in other specified circumstances: Secondary | ICD-10-CM | POA: Diagnosis not present

## 2022-08-04 DIAGNOSIS — J3081 Allergic rhinitis due to animal (cat) (dog) hair and dander: Secondary | ICD-10-CM | POA: Diagnosis not present

## 2022-08-04 DIAGNOSIS — B20 Human immunodeficiency virus [HIV] disease: Secondary | ICD-10-CM | POA: Diagnosis not present

## 2022-08-04 DIAGNOSIS — J301 Allergic rhinitis due to pollen: Secondary | ICD-10-CM | POA: Diagnosis not present

## 2022-08-05 ENCOUNTER — Other Ambulatory Visit: Payer: Self-pay | Admitting: Student

## 2022-08-05 DIAGNOSIS — J301 Allergic rhinitis due to pollen: Secondary | ICD-10-CM

## 2022-08-07 NOTE — Telephone Encounter (Signed)
Refill Request    fluticasone (FLONASE) 50 MCG/ACT nasal spray  [id number] - [place] ([place]), [place] - [place]

## 2022-08-08 DIAGNOSIS — Z7689 Persons encountering health services in other specified circumstances: Secondary | ICD-10-CM | POA: Diagnosis not present

## 2022-08-08 DIAGNOSIS — B20 Human immunodeficiency virus [HIV] disease: Secondary | ICD-10-CM | POA: Diagnosis not present

## 2022-08-09 DIAGNOSIS — B20 Human immunodeficiency virus [HIV] disease: Secondary | ICD-10-CM | POA: Diagnosis not present

## 2022-08-10 DIAGNOSIS — B20 Human immunodeficiency virus [HIV] disease: Secondary | ICD-10-CM | POA: Diagnosis not present

## 2022-08-11 DIAGNOSIS — J301 Allergic rhinitis due to pollen: Secondary | ICD-10-CM | POA: Diagnosis not present

## 2022-08-11 DIAGNOSIS — J3089 Other allergic rhinitis: Secondary | ICD-10-CM | POA: Diagnosis not present

## 2022-08-11 DIAGNOSIS — B20 Human immunodeficiency virus [HIV] disease: Secondary | ICD-10-CM | POA: Diagnosis not present

## 2022-08-11 DIAGNOSIS — H2512 Age-related nuclear cataract, left eye: Secondary | ICD-10-CM | POA: Diagnosis not present

## 2022-08-11 DIAGNOSIS — Z7689 Persons encountering health services in other specified circumstances: Secondary | ICD-10-CM | POA: Diagnosis not present

## 2022-08-11 DIAGNOSIS — J3081 Allergic rhinitis due to animal (cat) (dog) hair and dander: Secondary | ICD-10-CM | POA: Diagnosis not present

## 2022-08-14 DIAGNOSIS — B20 Human immunodeficiency virus [HIV] disease: Secondary | ICD-10-CM | POA: Diagnosis not present

## 2022-08-15 DIAGNOSIS — H2512 Age-related nuclear cataract, left eye: Secondary | ICD-10-CM | POA: Diagnosis not present

## 2022-08-15 DIAGNOSIS — B20 Human immunodeficiency virus [HIV] disease: Secondary | ICD-10-CM | POA: Diagnosis not present

## 2022-08-16 ENCOUNTER — Other Ambulatory Visit (HOSPITAL_COMMUNITY): Payer: Self-pay

## 2022-08-16 DIAGNOSIS — Z7689 Persons encountering health services in other specified circumstances: Secondary | ICD-10-CM | POA: Diagnosis not present

## 2022-08-16 DIAGNOSIS — B20 Human immunodeficiency virus [HIV] disease: Secondary | ICD-10-CM | POA: Diagnosis not present

## 2022-08-17 ENCOUNTER — Other Ambulatory Visit (HOSPITAL_COMMUNITY): Payer: Self-pay

## 2022-08-17 DIAGNOSIS — B20 Human immunodeficiency virus [HIV] disease: Secondary | ICD-10-CM | POA: Diagnosis not present

## 2022-08-18 DIAGNOSIS — Z419 Encounter for procedure for purposes other than remedying health state, unspecified: Secondary | ICD-10-CM | POA: Diagnosis not present

## 2022-08-18 DIAGNOSIS — J3089 Other allergic rhinitis: Secondary | ICD-10-CM | POA: Diagnosis not present

## 2022-08-18 DIAGNOSIS — B20 Human immunodeficiency virus [HIV] disease: Secondary | ICD-10-CM | POA: Diagnosis not present

## 2022-08-18 DIAGNOSIS — J301 Allergic rhinitis due to pollen: Secondary | ICD-10-CM | POA: Diagnosis not present

## 2022-08-18 DIAGNOSIS — Z7689 Persons encountering health services in other specified circumstances: Secondary | ICD-10-CM | POA: Diagnosis not present

## 2022-08-18 DIAGNOSIS — J3081 Allergic rhinitis due to animal (cat) (dog) hair and dander: Secondary | ICD-10-CM | POA: Diagnosis not present

## 2022-08-18 NOTE — Addendum Note (Signed)
Addended by: Angelique Blonder on: 08/18/2022 05:11 PM   Modules accepted: Orders

## 2022-08-22 DIAGNOSIS — B20 Human immunodeficiency virus [HIV] disease: Secondary | ICD-10-CM | POA: Diagnosis not present

## 2022-08-23 ENCOUNTER — Other Ambulatory Visit: Payer: Self-pay

## 2022-08-23 DIAGNOSIS — F1190 Opioid use, unspecified, uncomplicated: Secondary | ICD-10-CM

## 2022-08-24 ENCOUNTER — Other Ambulatory Visit: Payer: Self-pay

## 2022-08-24 DIAGNOSIS — F1190 Opioid use, unspecified, uncomplicated: Secondary | ICD-10-CM

## 2022-08-24 MED ORDER — TRAMADOL HCL 50 MG PO TABS: 50.0000 mg | ORAL_TABLET | Freq: Four times a day (QID) | ORAL | 0 refills | Status: DC | PRN

## 2022-08-25 DIAGNOSIS — J3089 Other allergic rhinitis: Secondary | ICD-10-CM | POA: Diagnosis not present

## 2022-08-25 DIAGNOSIS — J3081 Allergic rhinitis due to animal (cat) (dog) hair and dander: Secondary | ICD-10-CM | POA: Diagnosis not present

## 2022-08-25 DIAGNOSIS — Z7689 Persons encountering health services in other specified circumstances: Secondary | ICD-10-CM | POA: Diagnosis not present

## 2022-08-25 DIAGNOSIS — J301 Allergic rhinitis due to pollen: Secondary | ICD-10-CM | POA: Diagnosis not present

## 2022-08-28 DIAGNOSIS — B20 Human immunodeficiency virus [HIV] disease: Secondary | ICD-10-CM | POA: Diagnosis not present

## 2022-08-29 DIAGNOSIS — B20 Human immunodeficiency virus [HIV] disease: Secondary | ICD-10-CM | POA: Diagnosis not present

## 2022-09-01 DIAGNOSIS — J3089 Other allergic rhinitis: Secondary | ICD-10-CM | POA: Diagnosis not present

## 2022-09-01 DIAGNOSIS — J301 Allergic rhinitis due to pollen: Secondary | ICD-10-CM | POA: Diagnosis not present

## 2022-09-01 DIAGNOSIS — Z7689 Persons encountering health services in other specified circumstances: Secondary | ICD-10-CM | POA: Diagnosis not present

## 2022-09-04 DIAGNOSIS — B20 Human immunodeficiency virus [HIV] disease: Secondary | ICD-10-CM | POA: Diagnosis not present

## 2022-09-06 DIAGNOSIS — J301 Allergic rhinitis due to pollen: Secondary | ICD-10-CM | POA: Diagnosis not present

## 2022-09-06 DIAGNOSIS — J3089 Other allergic rhinitis: Secondary | ICD-10-CM | POA: Diagnosis not present

## 2022-09-07 DIAGNOSIS — B20 Human immunodeficiency virus [HIV] disease: Secondary | ICD-10-CM | POA: Diagnosis not present

## 2022-09-08 DIAGNOSIS — B20 Human immunodeficiency virus [HIV] disease: Secondary | ICD-10-CM | POA: Diagnosis not present

## 2022-09-11 DIAGNOSIS — J4530 Mild persistent asthma, uncomplicated: Secondary | ICD-10-CM | POA: Diagnosis not present

## 2022-09-11 DIAGNOSIS — J3089 Other allergic rhinitis: Secondary | ICD-10-CM | POA: Diagnosis not present

## 2022-09-11 DIAGNOSIS — J301 Allergic rhinitis due to pollen: Secondary | ICD-10-CM | POA: Diagnosis not present

## 2022-09-11 DIAGNOSIS — Z7689 Persons encountering health services in other specified circumstances: Secondary | ICD-10-CM | POA: Diagnosis not present

## 2022-09-11 DIAGNOSIS — J3081 Allergic rhinitis due to animal (cat) (dog) hair and dander: Secondary | ICD-10-CM | POA: Diagnosis not present

## 2022-09-11 DIAGNOSIS — H1045 Other chronic allergic conjunctivitis: Secondary | ICD-10-CM | POA: Diagnosis not present

## 2022-09-12 ENCOUNTER — Other Ambulatory Visit (HOSPITAL_COMMUNITY): Payer: Self-pay

## 2022-09-12 ENCOUNTER — Other Ambulatory Visit: Payer: Self-pay | Admitting: Internal Medicine

## 2022-09-12 DIAGNOSIS — I10 Essential (primary) hypertension: Secondary | ICD-10-CM

## 2022-09-13 DIAGNOSIS — B20 Human immunodeficiency virus [HIV] disease: Secondary | ICD-10-CM | POA: Diagnosis not present

## 2022-09-14 ENCOUNTER — Other Ambulatory Visit (HOSPITAL_COMMUNITY): Payer: Self-pay

## 2022-09-14 DIAGNOSIS — B20 Human immunodeficiency virus [HIV] disease: Secondary | ICD-10-CM | POA: Diagnosis not present

## 2022-09-15 ENCOUNTER — Other Ambulatory Visit (HOSPITAL_COMMUNITY): Payer: Self-pay

## 2022-09-15 DIAGNOSIS — B20 Human immunodeficiency virus [HIV] disease: Secondary | ICD-10-CM | POA: Diagnosis not present

## 2022-09-17 DIAGNOSIS — Z419 Encounter for procedure for purposes other than remedying health state, unspecified: Secondary | ICD-10-CM | POA: Diagnosis not present

## 2022-09-18 ENCOUNTER — Other Ambulatory Visit (HOSPITAL_COMMUNITY): Payer: Self-pay

## 2022-09-18 ENCOUNTER — Other Ambulatory Visit: Payer: Self-pay | Admitting: Family

## 2022-09-18 DIAGNOSIS — B20 Human immunodeficiency virus [HIV] disease: Secondary | ICD-10-CM | POA: Diagnosis not present

## 2022-09-18 MED ORDER — BIKTARVY 50-200-25 MG PO TABS
1.0000 | ORAL_TABLET | Freq: Every day | ORAL | 4 refills | Status: DC
  Filled 2022-09-18: qty 30, 30d supply, fill #0
  Filled 2022-10-11: qty 30, 30d supply, fill #1
  Filled 2022-11-07: qty 30, 30d supply, fill #2
  Filled 2022-12-05: qty 30, 30d supply, fill #3
  Filled 2023-01-09: qty 30, 30d supply, fill #4

## 2022-09-19 ENCOUNTER — Other Ambulatory Visit (HOSPITAL_COMMUNITY): Payer: Self-pay

## 2022-09-20 ENCOUNTER — Ambulatory Visit (INDEPENDENT_AMBULATORY_CARE_PROVIDER_SITE_OTHER): Payer: Medicaid Other | Admitting: Internal Medicine

## 2022-09-20 ENCOUNTER — Other Ambulatory Visit: Payer: Self-pay

## 2022-09-20 ENCOUNTER — Encounter: Payer: Self-pay | Admitting: Internal Medicine

## 2022-09-20 VITALS — BP 127/82 | HR 99 | Temp 98.1°F | Ht 63.0 in | Wt 113.9 lb

## 2022-09-20 DIAGNOSIS — Z23 Encounter for immunization: Secondary | ICD-10-CM

## 2022-09-20 DIAGNOSIS — Z0000 Encounter for general adult medical examination without abnormal findings: Secondary | ICD-10-CM

## 2022-09-20 DIAGNOSIS — R634 Abnormal weight loss: Secondary | ICD-10-CM

## 2022-09-20 DIAGNOSIS — K219 Gastro-esophageal reflux disease without esophagitis: Secondary | ICD-10-CM | POA: Diagnosis not present

## 2022-09-20 DIAGNOSIS — Z7689 Persons encountering health services in other specified circumstances: Secondary | ICD-10-CM | POA: Diagnosis not present

## 2022-09-20 DIAGNOSIS — F1190 Opioid use, unspecified, uncomplicated: Secondary | ICD-10-CM | POA: Diagnosis not present

## 2022-09-20 MED ORDER — TRAMADOL HCL 50 MG PO TABS: 50.0000 mg | ORAL_TABLET | Freq: Four times a day (QID) | ORAL | 0 refills | Status: DC | PRN

## 2022-09-20 MED ORDER — ONDANSETRON 4 MG PO TBDP: 4.0000 mg | ORAL_TABLET | Freq: Three times a day (TID) | ORAL | 0 refills | Status: AC | PRN

## 2022-09-20 NOTE — Progress Notes (Unsigned)
   CC: 6 week fu  HPI:Janice Williams is a 61 y.o. female who presents for evaluation of wt loss. Please see individual problem based A/P for details.  Patient reporting continued weight loss. Appetite has been poor, will have nausea with eating, and she does not eat much throughout the day. No trouble accessing or preparing food. Says that she had improved appetite with Megace, but has not been able to get this since recall. She was prescribed Mirtazapine but does not appear to have filled it. Denies diarrhea. No fever or chills.  Did not complete CXR ordered last visit due to time constraints.   Depression, PHQ-9: Based on the patients  [place] Visit from 09/20/2022 in [place]  PHQ-9 Total Score 1      score we have .  Past Medical History:  Diagnosis Date   Anemia    Arthritis    generalized   Blood transfusion without reported diagnosis 2003   Bronchitis    Cataract    COPD (chronic obstructive pulmonary disease) ([place])    COPD with acute exacerbation ([place]) 06/09/2017   Dental abscess 03/15/2015   DYSPEPSIA 02/14/2007   Qualifier: Diagnosis of  By: Samara Snide     EXTERNAL OTITIS 01/06/2010   Qualifier: Diagnosis of  By: Tomma Lightning MD, Claiborne Billings     FACIAL RASH 02/04/2009   Qualifier: Diagnosis of  By: Rhunette Croft     Former smoker    quit 2014   GERD (gastroesophageal reflux disease)    Glaucoma    HIV infection ([place])    Hypertension    Osteoporosis    Seasonal allergies    Stroke ([place]) 2003   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  Eyes: Negative.   Respiratory: Negative.    Gastrointestinal: Negative.      Physical Exam: Vitals:   09/20/22 1058  BP: 127/82  Pulse: 99  Temp: 98.1 F (36.7 C)  TempSrc: Oral  SpO2: 98%  Weight: 113 lb 14.4 oz (51.7 kg)  Height: $Remove'5\' 3"'UCpaubk$  (1.6  m)     General:  thin and frail appearing woman, nad HEENT: Conjunctiva nl , antiicteric sclerae, moist mucous membranes, no exudate or erythema Cardiovascular: Normal rate, regular rhythm.  No murmurs, rubs, or gallops Pulmonary : Equal breath sounds, No wheezes, rales, or rhonchi Abdominal: soft, nontender,  bowel sounds present Ext: No edema in lower extremities, no tenderness to palpation of lower extremities.   Assessment & Plan:   See Encounters Tab for problem based charting.  Pt has lost 4 additional pounds since appt in July. Poor appetite may be contributing factor but given her smoking history and elevated ESR, there is concern for possible underlying malignancy as well. She is not having any diarrhea that would suggest absorption problems, and HIV seems to be controlled.  -We will trial Zofran to improve nausea and hopefully PO intake.  - CT chest non-con due to concern for malignancy - f/u 1 month  Patient discussed with Dr. Jimmye Norman

## 2022-09-20 NOTE — Patient Instructions (Signed)
Dear Janice Williams,  Thank you for trusting [place] with your care today.  Please get the chest CT scan done. Please try the Mirtazapine to help with appetite. I have also refilled your Zofran and Tramadol.  Please return in 3 months for a follow up appointment.

## 2022-09-21 ENCOUNTER — Encounter: Payer: Self-pay | Admitting: Internal Medicine

## 2022-09-21 MED ORDER — OMEPRAZOLE 20 MG PO CPDR: 20.0000 mg | DELAYED_RELEASE_CAPSULE | Freq: Every day | ORAL | 5 refills | Status: DC

## 2022-09-21 NOTE — Assessment & Plan Note (Signed)
Patient reporting continued weight loss. Appetite has been poor, will have nausea with eating, and she does not eat much throughout the day. No trouble accessing or preparing food. Says that she had improved appetite with Megace, but has not been able to get this since recall. She was prescribed Mirtazapine but does not appear to have filled it. Denies diarrhea. No fever or chills.  Did not complete CXR ordered last visit due to time constraints.   Patient is thin appearing woman. NAD. RRR, lungs CTAB.  Pt has lost 4 additional pounds since appt in July. Poor appetite may be contributing factor but given her smoking history and elevated ESR, there is concern for possible underlying malignancy as well. She is not having any diarrhea that would suggest absorption problems, and HIV seems to be controlled.  -We will trial Zofran to improve nausea and hopefully PO intake.  - CT chest non-con due to concern for malignancy - f/u 1 month

## 2022-09-21 NOTE — Assessment & Plan Note (Signed)
Flu shot and mammogram

## 2022-09-21 NOTE — Assessment & Plan Note (Signed)
Refill tramadol

## 2022-09-22 DIAGNOSIS — J301 Allergic rhinitis due to pollen: Secondary | ICD-10-CM | POA: Diagnosis not present

## 2022-09-22 DIAGNOSIS — Z7689 Persons encountering health services in other specified circumstances: Secondary | ICD-10-CM | POA: Diagnosis not present

## 2022-09-22 DIAGNOSIS — J3089 Other allergic rhinitis: Secondary | ICD-10-CM | POA: Diagnosis not present

## 2022-09-22 DIAGNOSIS — J3081 Allergic rhinitis due to animal (cat) (dog) hair and dander: Secondary | ICD-10-CM | POA: Diagnosis not present

## 2022-09-25 DIAGNOSIS — B20 Human immunodeficiency virus [HIV] disease: Secondary | ICD-10-CM | POA: Diagnosis not present

## 2022-09-27 DIAGNOSIS — B20 Human immunodeficiency virus [HIV] disease: Secondary | ICD-10-CM | POA: Diagnosis not present

## 2022-09-28 DIAGNOSIS — B20 Human immunodeficiency virus [HIV] disease: Secondary | ICD-10-CM | POA: Diagnosis not present

## 2022-09-29 DIAGNOSIS — J3081 Allergic rhinitis due to animal (cat) (dog) hair and dander: Secondary | ICD-10-CM | POA: Diagnosis not present

## 2022-09-29 DIAGNOSIS — B20 Human immunodeficiency virus [HIV] disease: Secondary | ICD-10-CM | POA: Diagnosis not present

## 2022-09-29 DIAGNOSIS — Z7689 Persons encountering health services in other specified circumstances: Secondary | ICD-10-CM | POA: Diagnosis not present

## 2022-09-29 DIAGNOSIS — J3089 Other allergic rhinitis: Secondary | ICD-10-CM | POA: Diagnosis not present

## 2022-10-03 DIAGNOSIS — Z7689 Persons encountering health services in other specified circumstances: Secondary | ICD-10-CM | POA: Diagnosis not present

## 2022-10-03 DIAGNOSIS — B20 Human immunodeficiency virus [HIV] disease: Secondary | ICD-10-CM | POA: Diagnosis not present

## 2022-10-04 DIAGNOSIS — B20 Human immunodeficiency virus [HIV] disease: Secondary | ICD-10-CM | POA: Diagnosis not present

## 2022-10-05 DIAGNOSIS — B20 Human immunodeficiency virus [HIV] disease: Secondary | ICD-10-CM | POA: Diagnosis not present

## 2022-10-06 DIAGNOSIS — J3089 Other allergic rhinitis: Secondary | ICD-10-CM | POA: Diagnosis not present

## 2022-10-06 DIAGNOSIS — J301 Allergic rhinitis due to pollen: Secondary | ICD-10-CM | POA: Diagnosis not present

## 2022-10-06 DIAGNOSIS — Z7689 Persons encountering health services in other specified circumstances: Secondary | ICD-10-CM | POA: Diagnosis not present

## 2022-10-06 DIAGNOSIS — J3081 Allergic rhinitis due to animal (cat) (dog) hair and dander: Secondary | ICD-10-CM | POA: Diagnosis not present

## 2022-10-10 DIAGNOSIS — B20 Human immunodeficiency virus [HIV] disease: Secondary | ICD-10-CM | POA: Diagnosis not present

## 2022-10-11 ENCOUNTER — Other Ambulatory Visit (HOSPITAL_COMMUNITY): Payer: Self-pay

## 2022-10-11 DIAGNOSIS — B20 Human immunodeficiency virus [HIV] disease: Secondary | ICD-10-CM | POA: Diagnosis not present

## 2022-10-12 DIAGNOSIS — B20 Human immunodeficiency virus [HIV] disease: Secondary | ICD-10-CM | POA: Diagnosis not present

## 2022-10-13 ENCOUNTER — Other Ambulatory Visit (HOSPITAL_COMMUNITY): Payer: Self-pay

## 2022-10-13 DIAGNOSIS — B20 Human immunodeficiency virus [HIV] disease: Secondary | ICD-10-CM | POA: Diagnosis not present

## 2022-10-13 DIAGNOSIS — J3089 Other allergic rhinitis: Secondary | ICD-10-CM | POA: Diagnosis not present

## 2022-10-13 DIAGNOSIS — Z7689 Persons encountering health services in other specified circumstances: Secondary | ICD-10-CM | POA: Diagnosis not present

## 2022-10-13 DIAGNOSIS — J3081 Allergic rhinitis due to animal (cat) (dog) hair and dander: Secondary | ICD-10-CM | POA: Diagnosis not present

## 2022-10-13 DIAGNOSIS — J301 Allergic rhinitis due to pollen: Secondary | ICD-10-CM | POA: Diagnosis not present

## 2022-10-17 DIAGNOSIS — B20 Human immunodeficiency virus [HIV] disease: Secondary | ICD-10-CM | POA: Diagnosis not present

## 2022-10-18 DIAGNOSIS — Z419 Encounter for procedure for purposes other than remedying health state, unspecified: Secondary | ICD-10-CM | POA: Diagnosis not present

## 2022-10-19 ENCOUNTER — Other Ambulatory Visit: Payer: Self-pay

## 2022-10-19 DIAGNOSIS — B20 Human immunodeficiency virus [HIV] disease: Secondary | ICD-10-CM | POA: Diagnosis not present

## 2022-10-19 DIAGNOSIS — F1190 Opioid use, unspecified, uncomplicated: Secondary | ICD-10-CM

## 2022-10-19 MED ORDER — TRAMADOL HCL 50 MG PO TABS: 50.0000 mg | ORAL_TABLET | Freq: Four times a day (QID) | ORAL | 0 refills | Status: DC | PRN

## 2022-10-19 NOTE — Progress Notes (Signed)
Internal Medicine Clinic Attending  I saw and evaluated the patient.  I personally confirmed the key portions of the history and exam documented by Dr. Gawaluck and I reviewed pertinent patient test results.  The assessment, diagnosis, and plan were formulated together and I agree with the documentation in the resident's note.  

## 2022-10-20 DIAGNOSIS — Z7689 Persons encountering health services in other specified circumstances: Secondary | ICD-10-CM | POA: Diagnosis not present

## 2022-10-20 DIAGNOSIS — B20 Human immunodeficiency virus [HIV] disease: Secondary | ICD-10-CM | POA: Diagnosis not present

## 2022-10-20 DIAGNOSIS — J301 Allergic rhinitis due to pollen: Secondary | ICD-10-CM | POA: Diagnosis not present

## 2022-10-20 DIAGNOSIS — J3089 Other allergic rhinitis: Secondary | ICD-10-CM | POA: Diagnosis not present

## 2022-10-20 DIAGNOSIS — J3081 Allergic rhinitis due to animal (cat) (dog) hair and dander: Secondary | ICD-10-CM | POA: Diagnosis not present

## 2022-10-23 DIAGNOSIS — B20 Human immunodeficiency virus [HIV] disease: Secondary | ICD-10-CM | POA: Diagnosis not present

## 2022-10-24 DIAGNOSIS — B20 Human immunodeficiency virus [HIV] disease: Secondary | ICD-10-CM | POA: Diagnosis not present

## 2022-10-25 DIAGNOSIS — B20 Human immunodeficiency virus [HIV] disease: Secondary | ICD-10-CM | POA: Diagnosis not present

## 2022-10-27 DIAGNOSIS — J3081 Allergic rhinitis due to animal (cat) (dog) hair and dander: Secondary | ICD-10-CM | POA: Diagnosis not present

## 2022-10-27 DIAGNOSIS — B20 Human immunodeficiency virus [HIV] disease: Secondary | ICD-10-CM | POA: Diagnosis not present

## 2022-10-27 DIAGNOSIS — Z7689 Persons encountering health services in other specified circumstances: Secondary | ICD-10-CM | POA: Diagnosis not present

## 2022-10-27 DIAGNOSIS — J3089 Other allergic rhinitis: Secondary | ICD-10-CM | POA: Diagnosis not present

## 2022-10-27 DIAGNOSIS — J301 Allergic rhinitis due to pollen: Secondary | ICD-10-CM | POA: Diagnosis not present

## 2022-10-30 DIAGNOSIS — B20 Human immunodeficiency virus [HIV] disease: Secondary | ICD-10-CM | POA: Diagnosis not present

## 2022-10-31 DIAGNOSIS — B20 Human immunodeficiency virus [HIV] disease: Secondary | ICD-10-CM | POA: Diagnosis not present

## 2022-11-01 DIAGNOSIS — B20 Human immunodeficiency virus [HIV] disease: Secondary | ICD-10-CM | POA: Diagnosis not present

## 2022-11-02 DIAGNOSIS — B20 Human immunodeficiency virus [HIV] disease: Secondary | ICD-10-CM | POA: Diagnosis not present

## 2022-11-03 ENCOUNTER — Telehealth: Payer: Self-pay | Admitting: Internal Medicine

## 2022-11-03 ENCOUNTER — Other Ambulatory Visit: Payer: Self-pay | Admitting: Internal Medicine

## 2022-11-03 DIAGNOSIS — B20 Human immunodeficiency virus [HIV] disease: Secondary | ICD-10-CM | POA: Diagnosis not present

## 2022-11-03 MED ORDER — SYMBICORT 80-4.5 MCG/ACT IN AERO: INHALATION_SPRAY | RESPIRATORY_TRACT | 1 refills | Status: DC

## 2022-11-03 NOTE — Telephone Encounter (Signed)
Medication was sent today with 11 refills attached

## 2022-11-03 NOTE — Telephone Encounter (Signed)
Attempted to call pt but unable to reach. Left message for her to return call. 

## 2022-11-03 NOTE — Telephone Encounter (Signed)
Spoke with pt and verified that she would be getting 1 Symbicort to last until her OV on 11/16/22. Refill order was placed. Nothing further needed at this time.

## 2022-11-06 DIAGNOSIS — B20 Human immunodeficiency virus [HIV] disease: Secondary | ICD-10-CM | POA: Diagnosis not present

## 2022-11-07 ENCOUNTER — Other Ambulatory Visit (HOSPITAL_COMMUNITY): Payer: Self-pay

## 2022-11-07 DIAGNOSIS — B20 Human immunodeficiency virus [HIV] disease: Secondary | ICD-10-CM | POA: Diagnosis not present

## 2022-11-08 DIAGNOSIS — B20 Human immunodeficiency virus [HIV] disease: Secondary | ICD-10-CM | POA: Diagnosis not present

## 2022-11-10 ENCOUNTER — Other Ambulatory Visit (HOSPITAL_COMMUNITY): Payer: Self-pay

## 2022-11-13 DIAGNOSIS — B20 Human immunodeficiency virus [HIV] disease: Secondary | ICD-10-CM | POA: Diagnosis not present

## 2022-11-14 DIAGNOSIS — B20 Human immunodeficiency virus [HIV] disease: Secondary | ICD-10-CM | POA: Diagnosis not present

## 2022-11-15 DIAGNOSIS — B20 Human immunodeficiency virus [HIV] disease: Secondary | ICD-10-CM | POA: Diagnosis not present

## 2022-11-16 ENCOUNTER — Encounter: Payer: Self-pay | Admitting: Internal Medicine

## 2022-11-16 ENCOUNTER — Ambulatory Visit (INDEPENDENT_AMBULATORY_CARE_PROVIDER_SITE_OTHER): Payer: Medicaid Other | Admitting: Internal Medicine

## 2022-11-16 VITALS — BP 122/78 | HR 64 | Temp 97.9°F | Ht 63.0 in | Wt 104.4 lb

## 2022-11-16 DIAGNOSIS — Z87891 Personal history of nicotine dependence: Secondary | ICD-10-CM | POA: Diagnosis not present

## 2022-11-16 DIAGNOSIS — J449 Chronic obstructive pulmonary disease, unspecified: Secondary | ICD-10-CM | POA: Diagnosis not present

## 2022-11-16 DIAGNOSIS — Z7689 Persons encountering health services in other specified circumstances: Secondary | ICD-10-CM | POA: Diagnosis not present

## 2022-11-16 DIAGNOSIS — B20 Human immunodeficiency virus [HIV] disease: Secondary | ICD-10-CM | POA: Diagnosis not present

## 2022-11-16 NOTE — Patient Instructions (Signed)
You are eligible for free CT scanning for the next 5 years thru your primary care doctor - call this office if we can help get it scheduled   If you are satisfied with your treatment plan,  let your doctor know and he/she can either refill your medications or you can return here when your prescription runs out.     If in any way you are not 100% satisfied,  please tell us.  If 100% better, tell your friends!  Pulmonary follow up is as needed

## 2022-11-16 NOTE — Assessment & Plan Note (Addendum)
Quit smoking 2014 Low-dose CT lung cancer screening is recommended for patients who are 30-62 years of age with a 20+ pack-year history of smoking and who are currently smoking or quit <=15 years ago. No coughing up blood  No unintentional weight loss of > 15 pounds in the last 6 months - pt is eligible for scanning yearly until 2029 > defer to PCP unless requested to do thru this office as I will not be following the pt longterm / advised

## 2022-11-16 NOTE — Progress Notes (Signed)
Janice Williams, female    DOB: 01-Mar-1961,    MRN: [id number]   Brief patient profile:  66 yobf  HIV 2008 Quit smoking in 2014 cough / sob rx saba then symbicort which helped but still coughing so referred to pulmonary clinic 07/08/2019 by Janice Williams   Started allergy shots Aug 2021  Plus zyrtec from Janice Williams  History of Present Illness  07/08/2019  Pulmonary/ 1st office eval/Janice Williams  Chief Complaint  Patient presents with   Pulmonary Consult    Referred by Janice Williams for eval of COPD. Pt c/o cough for the past several months- occ prod with clear sputum. She uses her albuterol inhaler about 2 x per day.   Dyspnea:  MMRC3 = can't walk 100 yards even at a slow pace at a flat grade s stopping due to sob  mb to house and has to stop landing to catch here back slt uphill and 2 steps to top landing  Cough: starts a few hours p supper much worse lying > min clear mucus Sleep: p drinking water settles down able to sleep s noct or am flare  SABA use: helps  pred def helps Hb variable, uses ppi prn  rec Prednisone 10 mg take  4 each am x 2 days,   2 each am x 2 days,  1 each am x 2 days and stop  Plan A = Automatic = symbicort 80 Take 2 puffs first thing in am and then another 2 puffs about 12 hours later.  Work on inhaler technique:    Plan B = Backup Only use your albuterol inhaler as a rescue medication    04/21/21 Williams rec Flutter/ breztri trial > no better so resumed symbicort 80   09/14/2021  f/u ov/Janice Williams re: COPD GOLD 3    maint on symb 80 /allergy shots per Janice Williams  Chief Complaint  Patient presents with   Follow-up   Dyspnea:  vacuum/  MMRC2 = can't walk a nl pace on a flat grade s sob but does fine slow and flat e Cough: none now  Sleeping: bed is flat, 30 degrees  SABA use: one maybe 02: none  Covid status:   vax x 4  Rec No change in medications    11/16/2022  yearly  f/u ov/Janice Williams re: GOLD 3 COPD  maint on symb 80 2bid / allergy rx per Janice Williams  Chief Complaint   Patient presents with   Follow-up    Sob with exertion, cough-clear   Dyspnea:  MMRC2 = can't walk a nl pace on a flat grade s sob but does fine slow and flat   Cough: some in am attributed to ongoing severe chronic rhinitis ? Sinsusitis already being addressed by Janice Williams  Sleeping: flat bed/ lots of pillows o/w can't breathe SABA use: none  02: none    No obvious day to day or daytime variability or assoc excess/ purulent sputum or mucus plugs or hemoptysis or cp or chest tightness, subjective wheeze or overt sinus or hb symptoms.   Sleeping  without nocturnal  or early am exacerbation  of respiratory  c/o's or need for noct saba. Also denies any obvious fluctuation of symptoms with weather or environmental changes or other aggravating or alleviating factors except as outlined above   No unusual exposure hx or h/o childhood pna/ asthma or knowledge of premature birth.  Current Allergies, Complete Past Medical History, Past Surgical History, Family History, and Social History were reviewed in  [facility] electronic medical record.  ROS  The following are not active complaints unless bolded Hoarseness, sore throat, dysphagia, dental problems, itching, sneezing,  nasal congestion or discharge of excess mucus or purulent secretions, ear ache,   fever, chills, sweats, unintended wt loss or wt gain, classically pleuritic or exertional cp,  orthopnea pnd or arm/hand swelling  or leg swelling, presyncope, palpitations, abdominal pain, anorexia, nausea, vomiting, diarrhea  or change in bowel habits or change in bladder habits, change in stools or change in urine, dysuria, hematuria,  rash, arthralgias, visual complaints, headache, numbness, weakness or ataxia or problems with walking or coordination,  change in mood or  memory.        Current Meds  Medication Sig   amLODipine (NORVASC) 10 MG tablet Take 1 tablet by mouth once daily   benzonatate (TESSALON) 100 MG capsule Take 1 capsule (100  mg total) by mouth every 8 (eight) hours.   bictegravir-emtricitabine-tenofovir AF (BIKTARVY) 50-200-25 MG TABS tablet Take 1 tablet by mouth daily.   budesonide-formoterol (SYMBICORT) 80-4.5 MCG/ACT inhaler Inhale 2 puffs into the lungs daily.   Ensure (ENSURE) Take 237 mLs by mouth 3 (three) times daily between meals.   EPIPEN 2-PAK 0.3 MG/0.3ML SOAJ injection    fluticasone (FLONASE) 50 MCG/ACT nasal spray Use 1 spray(s) in each nostril once daily   mirtazapine (REMERON) 7.5 MG tablet Take 1 tablet (7.5 mg total) by mouth at bedtime.   omeprazole (PRILOSEC) 20 MG capsule Take 1 capsule (20 mg total) by mouth daily.   pravastatin (PRAVACHOL) 40 MG tablet Take 1 tablet by mouth once daily   PRESCRIPTION MEDICATION Inject 1 Dose into the skin once a week. Allergy shot in office on Fridays   RESTASIS 0.05 % ophthalmic emulsion 1 drop 2 (two) times daily.   sorbitol 70 % solution Take 15 mLs by mouth daily as needed.   traMADol (ULTRAM) 50 MG tablet Take 1 tablet (50 mg total) by mouth every 6 (six) hours as needed.          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, clean 7 years   SVD (spontaneous vaginal delivery)    x 3     Objective:   Wts  11/16/2022      104  09/14/2021       109 09/14/2020        98 06/09/2020        97  08/11/19 100 lb 3.2 oz (45.5 kg)  07/08/19 102 lb (46.3 kg)  12/02/18 99 lb (44.9 kg)   Vital signs reviewed  11/16/2022  - Note at rest 02 sats  91% on RA   General appearance:    amb bf nad    HEENT :  wearing mask not removed at pt request    NECK :  without JVD/Nodes/TM/ nl carotid upstrokes bilaterally   LUNGS: no acc muscle use,  Mod barrel  contour chest  wall with bilateral  Distant bs s audible wheeze and  without cough on insp or exp maneuvers and mod  Hyperresonant  to  percussion  bilaterally     CV:  RRR  no s3 or murmur or increase in P2, and no edema   ABD:  soft and nontender with pos mid insp Hoover's  in the supine position. No bruits or organomegaly appreciated, bowel sounds nl  MS:   Ext warm without deformities or   obvious joint restrictions , calf tenderness, cyanosis or clubbing  SKIN: warm and dry without lesions    NEURO:  alert, approp, nl sensorium with  no motor or cerebellar deficits apparent.            Assessment

## 2022-11-16 NOTE — Assessment & Plan Note (Signed)
Quit smoking 2014  -08/11/2019  After extensive coaching inhaler device,  effectiveness =    50% from baseline 25% (short Ti) > continue symb 80 2bid  - 06/09/2020  After extensive coaching inhaler device,  effectiveness =    75% > try breztri 2bid if insurance covers> no change on breztri so stayed with symbicort 80  - flutter valve added 04/21/21 - PFT's  09/14/2021  FEV1 0.74  (32 % ) ratio 0.41  p 0 % improvement from saba p symb 80 prior to study with DLCO  11.08 (50%) corrects to 3.44 (82%)  for alv volume and FV curve classic concavity    Group D (now reclassified as E) in terms of symptom/risk and laba/lama/ICS  therefore appropriate rx at this point >>>  breztri but due to hiv pt preferred lower dose ICS so rec symb 802 bid and doing fine on this rx x for persistent am cough attributed to rhinitis / sinusitis already being addressed by Dr [name]  Pulmonary f/u can be prn          Each maintenance medication was reviewed in detail including emphasizing most importantly the difference between maintenance and prns and under what circumstances the prns are to be triggered using an action plan format where appropriate.  Total time for H and P, chart review, counseling, reviewing hfa  device(s) and generating customized AVS unique to this office visit / same day charting = 21 min

## 2022-11-17 ENCOUNTER — Other Ambulatory Visit: Payer: Self-pay | Admitting: Student

## 2022-11-17 DIAGNOSIS — Z419 Encounter for procedure for purposes other than remedying health state, unspecified: Secondary | ICD-10-CM | POA: Diagnosis not present

## 2022-11-17 DIAGNOSIS — F1190 Opioid use, unspecified, uncomplicated: Secondary | ICD-10-CM

## 2022-11-17 DIAGNOSIS — B20 Human immunodeficiency virus [HIV] disease: Secondary | ICD-10-CM | POA: Diagnosis not present

## 2022-11-20 NOTE — Telephone Encounter (Signed)
traMADol (ULTRAM) 50 MG tablet   [id number] [place], [place] - [name]

## 2022-11-20 NOTE — Telephone Encounter (Signed)
Next appt scheduled 12/21/22 with Dr Jodell Cipro.

## 2022-11-21 ENCOUNTER — Other Ambulatory Visit: Payer: Self-pay | Admitting: Internal Medicine

## 2022-11-21 NOTE — Telephone Encounter (Signed)
Pt following up again on her med refill.  Pharmacy sent a request on 11/17/2022 per the pt and she called in 11/20/2022 to follow up.  Pt would like for the nurse to call her back.  traMADol (ULTRAM) 50 MG tablet    [id number] [place], [place] - [address]

## 2022-11-22 NOTE — Telephone Encounter (Signed)
[facility] She is very [employer].

## 2022-11-24 DIAGNOSIS — J3089 Other allergic rhinitis: Secondary | ICD-10-CM | POA: Diagnosis not present

## 2022-11-24 DIAGNOSIS — J301 Allergic rhinitis due to pollen: Secondary | ICD-10-CM | POA: Diagnosis not present

## 2022-11-24 DIAGNOSIS — Z7689 Persons encountering health services in other specified circumstances: Secondary | ICD-10-CM | POA: Diagnosis not present

## 2022-11-29 DIAGNOSIS — Z7689 Persons encountering health services in other specified circumstances: Secondary | ICD-10-CM | POA: Diagnosis not present

## 2022-12-01 ENCOUNTER — Other Ambulatory Visit: Payer: Self-pay | Admitting: Student

## 2022-12-01 DIAGNOSIS — J301 Allergic rhinitis due to pollen: Secondary | ICD-10-CM | POA: Diagnosis not present

## 2022-12-01 DIAGNOSIS — J3081 Allergic rhinitis due to animal (cat) (dog) hair and dander: Secondary | ICD-10-CM | POA: Diagnosis not present

## 2022-12-01 DIAGNOSIS — I10 Essential (primary) hypertension: Secondary | ICD-10-CM

## 2022-12-01 DIAGNOSIS — Z7689 Persons encountering health services in other specified circumstances: Secondary | ICD-10-CM | POA: Diagnosis not present

## 2022-12-01 DIAGNOSIS — J3089 Other allergic rhinitis: Secondary | ICD-10-CM | POA: Diagnosis not present

## 2022-12-05 ENCOUNTER — Other Ambulatory Visit: Payer: Self-pay

## 2022-12-07 ENCOUNTER — Other Ambulatory Visit: Payer: Self-pay

## 2022-12-08 DIAGNOSIS — J3081 Allergic rhinitis due to animal (cat) (dog) hair and dander: Secondary | ICD-10-CM | POA: Diagnosis not present

## 2022-12-08 DIAGNOSIS — Z7689 Persons encountering health services in other specified circumstances: Secondary | ICD-10-CM | POA: Diagnosis not present

## 2022-12-08 DIAGNOSIS — J3089 Other allergic rhinitis: Secondary | ICD-10-CM | POA: Diagnosis not present

## 2022-12-08 DIAGNOSIS — J301 Allergic rhinitis due to pollen: Secondary | ICD-10-CM | POA: Diagnosis not present

## 2022-12-15 DIAGNOSIS — J3089 Other allergic rhinitis: Secondary | ICD-10-CM | POA: Diagnosis not present

## 2022-12-15 DIAGNOSIS — Z7689 Persons encountering health services in other specified circumstances: Secondary | ICD-10-CM | POA: Diagnosis not present

## 2022-12-15 DIAGNOSIS — J3081 Allergic rhinitis due to animal (cat) (dog) hair and dander: Secondary | ICD-10-CM | POA: Diagnosis not present

## 2022-12-15 DIAGNOSIS — J301 Allergic rhinitis due to pollen: Secondary | ICD-10-CM | POA: Diagnosis not present

## 2022-12-18 DIAGNOSIS — Z419 Encounter for procedure for purposes other than remedying health state, unspecified: Secondary | ICD-10-CM | POA: Diagnosis not present

## 2022-12-21 ENCOUNTER — Ambulatory Visit (INDEPENDENT_AMBULATORY_CARE_PROVIDER_SITE_OTHER): Payer: Medicaid Other

## 2022-12-21 ENCOUNTER — Telehealth: Payer: Self-pay

## 2022-12-21 VITALS — BP 134/96 | HR 87 | Temp 98.4°F | Ht 63.0 in | Wt 100.4 lb

## 2022-12-21 DIAGNOSIS — Z131 Encounter for screening for diabetes mellitus: Secondary | ICD-10-CM | POA: Diagnosis not present

## 2022-12-21 DIAGNOSIS — F1190 Opioid use, unspecified, uncomplicated: Secondary | ICD-10-CM

## 2022-12-21 DIAGNOSIS — K5900 Constipation, unspecified: Secondary | ICD-10-CM | POA: Diagnosis not present

## 2022-12-21 DIAGNOSIS — E785 Hyperlipidemia, unspecified: Secondary | ICD-10-CM | POA: Diagnosis not present

## 2022-12-21 DIAGNOSIS — I10 Essential (primary) hypertension: Secondary | ICD-10-CM | POA: Diagnosis not present

## 2022-12-21 DIAGNOSIS — L720 Epidermal cyst: Secondary | ICD-10-CM

## 2022-12-21 DIAGNOSIS — R21 Rash and other nonspecific skin eruption: Secondary | ICD-10-CM | POA: Diagnosis not present

## 2022-12-21 DIAGNOSIS — K219 Gastro-esophageal reflux disease without esophagitis: Secondary | ICD-10-CM

## 2022-12-21 DIAGNOSIS — Z87891 Personal history of nicotine dependence: Secondary | ICD-10-CM | POA: Diagnosis not present

## 2022-12-21 DIAGNOSIS — R053 Chronic cough: Secondary | ICD-10-CM

## 2022-12-21 DIAGNOSIS — R634 Abnormal weight loss: Secondary | ICD-10-CM | POA: Diagnosis not present

## 2022-12-21 DIAGNOSIS — Z7689 Persons encountering health services in other specified circumstances: Secondary | ICD-10-CM | POA: Diagnosis not present

## 2022-12-21 DIAGNOSIS — K5903 Drug induced constipation: Secondary | ICD-10-CM

## 2022-12-21 MED ORDER — ONDANSETRON HCL 4 MG PO TABS: 4.0000 mg | ORAL_TABLET | Freq: Every day | ORAL | 1 refills | Status: DC | PRN

## 2022-12-21 MED ORDER — MIRTAZAPINE 15 MG PO TABS: 15.0000 mg | ORAL_TABLET | Freq: Every day | ORAL | 0 refills | Status: DC

## 2022-12-21 MED ORDER — AMLODIPINE BESYLATE 10 MG PO TABS: 10.0000 mg | ORAL_TABLET | Freq: Every day | ORAL | 0 refills | Status: DC

## 2022-12-21 MED ORDER — DOCUSATE SODIUM 100 MG PO CAPS: 100.0000 mg | ORAL_CAPSULE | Freq: Every day | ORAL | 0 refills | Status: AC | PRN

## 2022-12-21 MED ORDER — OMEPRAZOLE 20 MG PO CPDR: 20.0000 mg | DELAYED_RELEASE_CAPSULE | Freq: Every day | ORAL | 0 refills | Status: DC

## 2022-12-21 MED ORDER — TRAMADOL HCL 50 MG PO TABS: 50.0000 mg | ORAL_TABLET | Freq: Four times a day (QID) | ORAL | 0 refills | Status: DC | PRN

## 2022-12-21 NOTE — Telephone Encounter (Signed)
Prior Authorization for patient (tramadol) came through on cover my meds was submitted with last office notes awaiting approval or denial

## 2022-12-21 NOTE — Assessment & Plan Note (Addendum)
Contniues to lose weight, now BMI <18. Mirtazipine not helping too much but she says she is eating full meals at least twice a day and describes a fairly robust diet. Was not able to get chest CT after last visit. Endorses some intermittent nausea associated with sorbitol but no vomiting. Colonoscopy last year without any dysplasia. Zofran helped initially but she ran out.  Abdominal exam is unremarkable except for mild diffuse tenderness to deep palpation. - Will refill short course of Zofran and continue omeprazole - Encouraged her to get CT with contrast to rule out lung malignancy.  If this is negative and she continues having unintentional weight loss, can consider EGD - Changed sorbitol to Colace - Increase Remeron to 15 mg nightly

## 2022-12-21 NOTE — Assessment & Plan Note (Addendum)
Productive cough started 1 week ago and coughing up green sputum. No fevers, chills, myalgias, n/v. No sick contacts. HIV is well controlled on biktarvy and she has follow up on the 8th. Not smoking currently, quit in 2014 after smoking 14-15 years. Not feeling dyspneic and hasn't had to use albuterol. Recent urgent care visit for sinus problems and has chronic sinus issues as well.  Already using Flonase, antihistamine, nasal irrigation which has some effective relief.  Sees allergy and immunology for this issue who recently referred her to ENT for further evaluation.  Has mild tenderness over maxilla bilaterally, no significant nasal drainage on my exam.  No dyspnea and no wheezing during exam.  Believe her cough is likely related to her sinus issues, will continue current treatment and to monitor for now until she is able to see ENT.

## 2022-12-21 NOTE — Assessment & Plan Note (Addendum)
Has chronic constipation, has tried miralax and senokot without relief.  Has been taking sorbitol since last year but this makes her nauseous.  She typically feels nauseous until she has a runny bowel movement after taking sorbitol.  Wants to try something else as she believes this is also affecting her appetite.  Will try Colace and see if that works.  Other options in the future might include lactulose.

## 2022-12-21 NOTE — Assessment & Plan Note (Addendum)
Rash of r hand in distal phlanges that started as "water bumps" and skin breakdown. 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 She has not used any new soaps, detergents, gloves, hand lotions or any other new products.  She does have long nails and exam reveals dry skin with small clear vesicles and no surrounding erythema.  No tenderness to palpation.  Low risk for HSV.  Appears similar to contact dermatitis or dyshidrotic eczema. - Recommended hypoallergenic cream and will refer to dermatology.

## 2022-12-21 NOTE — Patient Instructions (Signed)
[name], it was a pleasure seeing you today! You endorsed feeling well today. Below are some of the things we talked about this visit. We look forward to seeing you in the follow up appointment!  Today we discussed: Weight loss: I am resending the order for the chest scan and they will call you to schedule. I also went up on your mirtazapine dose.  Constipation: I am changing sorbitol to colace  Nausea: I am restarting omeprazole, please take one pill daily for the next 4 weeks. I also sent some zofran which you can use daily as needed for nausea.  [facility]  I send the dermatology referral  I will call with lab results  I have ordered the following labs today:   Lab Orders         Hemoglobin A1c         Lipid Profile         BMP8+Anion Gap       Referrals ordered today:    Referral Orders         Ambulatory referral to Dermatology      I have ordered the following medication/changed the following medications:   Stop the following medications: Medications Discontinued During This Encounter  Medication Reason   sorbitol 70 % solution Discontinued by provider   sorbitol 70 % SOLN Discontinued by provider   mirtazapine (REMERON) 7.5 MG tablet Reorder   omeprazole (PRILOSEC) 20 MG capsule Reorder   traMADol (ULTRAM) 50 MG tablet Reorder     Start the following medications: Meds ordered this encounter  Medications   ondansetron (ZOFRAN) 4 MG tablet    Sig: Take 1 tablet (4 mg total) by mouth daily as needed for nausea or vomiting.    Dispense:  30 tablet    Refill:  1   mirtazapine (REMERON) 15 MG tablet    Sig: Take 1 tablet (15 mg total) by mouth at bedtime.    Dispense:  30 tablet    Refill:  0   amLODipine (NORVASC) 10 MG tablet    Sig: Take 1 tablet (10 mg total) by mouth daily.    Dispense:  90 tablet    Refill:  0   docusate sodium (COLACE) 100 MG capsule    Sig: Take 1 capsule (100 mg total) by  mouth daily as needed.    Dispense:  30 capsule    Refill:  0   omeprazole (PRILOSEC) 20 MG capsule    Sig: Take 1 capsule (20 mg total) by mouth daily.    Dispense:  30 capsule    Refill:  0   traMADol (ULTRAM) 50 MG tablet    Sig: Take 1 tablet (50 mg total) by mouth every 6 (six) hours as needed.    Dispense:  60 tablet    Refill:  0    Not to exceed 5 additional fills before 04/17/2023     Follow-up:  4 weeks    Please make sure to arrive 15 minutes prior to your next appointment. If you arrive late, you may be asked to reschedule.   We look forward to seeing you next time. Please call our clinic at [phone] if you have any questions or concerns. The best time to call is Monday-Friday from 9am-4pm, but there is someone available 24/7. If after hours or the weekend, call the main hospital number and ask for the Internal Medicine Resident On-Call. If you  need medication refills, please notify your pharmacy one week in advance and they will send [place] a request.  Thank you for letting us take part in your care. Wishing you the best!  Thank you, Linus Galas MD

## 2022-12-21 NOTE — Progress Notes (Signed)
CC: Rash, weight loss  HPI:  [name] is a 62 y.o.-year-old female with past medical history as below presenting for rash and weight loss.  Please see encounters tab for problem-based charting.  Past Medical History:  Diagnosis Date   Anemia    Arthritis    generalized   Blood transfusion without reported diagnosis 2003   Bronchitis    Cataract    COPD (chronic obstructive pulmonary disease) ([place])    COPD with acute exacerbation ([place]) 06/09/2017   Dental abscess 03/15/2015   DYSPEPSIA 02/14/2007   Qualifier: Diagnosis of  By: Samara Snide     EXTERNAL OTITIS 01/06/2010   Qualifier: Diagnosis of  By: Tomma Lightning MD, Claiborne Billings     FACIAL RASH 02/04/2009   Qualifier: Diagnosis of  By: Rhunette Croft     Former smoker    quit 2014   GERD (gastroesophageal reflux disease)    Glaucoma    HIV infection ([place])    Hypertension    Osteoporosis    Seasonal allergies    Stroke ([place]) 2003   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  Please see encounters tab for problem based charting.  Physical Exam:  Vitals:   12/21/22 1029  BP: (!) 134/96  Pulse: 87  Temp: 98.4 F (36.9 C)  TempSrc: Oral  SpO2: 100%  Weight: 100 lb 6.4 oz (45.5 kg)  Height: '5\' 3"'$  (1.6 m)   General:Well-appearing, pleasant, In NAD HEENT:  Slight tenderness to palpation over maxilla bilaterally without significant nasal drainage. Cardiac: RRR, no murmurs rubs or gallops. Respiratory: Normal work of breathing on room air, CTAB Abdominal: Soft, mild diffuse tenderness to deep palpation, nondistended Skin:Multiple small to moderate-sized epidermal inclusion cysts over face. long nails and exam reveals dry skin with small clear vesicles and no surrounding erythema    Assessment & Plan:   Rash of hand Rash of r hand in distal phlanges that started as "water bumps" and skin breakdown. 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 She has not used any new soaps, detergents, gloves, hand lotions or any other new products.  She does have long nails and exam reveals dry skin with small clear vesicles and no surrounding erythema.  No tenderness to palpation.  Low risk for HSV.  Appears similar to contact dermatitis or dyshidrotic eczema. - Recommended hypoallergenic cream and will refer to dermatology.  Cough Productive cough started 1 week ago and coughing up green sputum. No fevers, chills, myalgias, n/v. No sick contacts. HIV is well controlled on biktarvy and she has follow up on the 8th. Not smoking currently, quit in 2014 after smoking 14-15 years. Not feeling dyspneic and hasn't had to use albuterol. Recent urgent care visit for sinus problems and has chronic sinus issues as well.  Already using Flonase, antihistamine, nasal irrigation which has some effective relief.  Sees allergy and immunology for this issue who recently referred her to ENT for further evaluation.  Has mild tenderness over maxilla bilaterally, no significant nasal drainage on my exam.  No dyspnea and no wheezing during exam.  Believe her cough is likely related to her sinus issues, will continue current treatment and to monitor for now until she is able to see ENT.  Constipation Has chronic constipation, has tried miralax and senokot without relief.  Has been taking sorbitol since last year  but this makes her nauseous.  She typically feels nauseous until she has a runny bowel movement after taking sorbitol.  Wants to try something else as she believes this is also affecting her appetite.  Will try Colace and see if that works.  Other options in the future might include lactulose.  WEIGHT LOSS Contniues to lose weight, now BMI <18. Mirtazipine not helping too much but she says she is eating full meals at least twice a day and describes a fairly robust diet. Was not able to get chest CT after last visit. Endorses some  intermittent nausea associated with sorbitol but no vomiting. Colonoscopy last year without any dysplasia. Zofran helped initially but she ran out.  Abdominal exam is unremarkable except for mild diffuse tenderness to deep palpation. - Will refill short course of Zofran and continue omeprazole - Encouraged her to get CT with contrast to rule out lung malignancy.  If this is negative and she continues having unintentional weight loss, can consider EGD - Changed sorbitol to Colace - Increase Remeron to 15 mg nightly  Hypertension Blood pressure well-controlled on amlodipine 10, will continue.   Epidermal inclusion cyst Has multiple moderate sized epidermal inclusion cysts on her face.  Mentions she has had these for a long time and has previously had to have some of them removed with dermatology.  Encouraged that she return to her dermatologist to further address this and possibly have the larger ones removed.  Hyperlipidemia Addendum: LDL persistently elevated, switched to crestor 20 for better control towards goal < 70 (hx CVA)   Patient seen with Dr. Philipp Ovens

## 2022-12-22 DIAGNOSIS — J3089 Other allergic rhinitis: Secondary | ICD-10-CM | POA: Diagnosis not present

## 2022-12-22 DIAGNOSIS — J301 Allergic rhinitis due to pollen: Secondary | ICD-10-CM | POA: Diagnosis not present

## 2022-12-22 DIAGNOSIS — L720 Epidermal cyst: Secondary | ICD-10-CM | POA: Insufficient documentation

## 2022-12-22 DIAGNOSIS — Z7689 Persons encountering health services in other specified circumstances: Secondary | ICD-10-CM | POA: Diagnosis not present

## 2022-12-22 LAB — LIPID PANEL
Chol/HDL Ratio: 2.4 ratio (ref 0.0–4.4)
Cholesterol, Total: 238 mg/dL — ABNORMAL HIGH (ref 100–199)
HDL: 101 mg/dL (ref >39)
LDL Chol Calc (NIH): 127 mg/dL — ABNORMAL HIGH (ref 0–99)
Triglycerides: 59 mg/dL (ref 0–149)
VLDL Cholesterol Cal: 10 mg/dL (ref 5–40)

## 2022-12-22 LAB — HEMOGLOBIN A1C
Est. average glucose Bld gHb Est-mCnc: 111 mg/dL
Hgb A1c MFr Bld: 5.5 % (ref 4.8–5.6)

## 2022-12-22 LAB — BMP8+ANION GAP
Anion Gap: 15 mmol/L (ref 10.0–18.0)
BUN/Creatinine Ratio: 10 — ABNORMAL LOW (ref 12–28)
BUN: 9 mg/dL (ref 8–27)
CO2: 25 mmol/L (ref 20–29)
Calcium: 10.1 mg/dL (ref 8.7–10.3)
Chloride: 100 mmol/L (ref 96–106)
Creatinine, Ser: 0.87 mg/dL (ref 0.57–1.00)
Glucose: 91 mg/dL (ref 70–99)
Potassium: 5 mmol/L (ref 3.5–5.2)
Sodium: 140 mmol/L (ref 134–144)
eGFR: 75 mL/min/{1.73_m2} (ref >59)

## 2022-12-22 NOTE — Assessment & Plan Note (Signed)
Has multiple moderate sized epidermal inclusion cysts on her face.  Mentions she has had these for a long time and has previously had to have some of them removed with dermatology.  Encouraged that she return to her dermatologist to further address this and possibly have the larger ones removed.

## 2022-12-22 NOTE — Assessment & Plan Note (Signed)
Blood pressure well-controlled on amlodipine 10, will continue.

## 2022-12-25 ENCOUNTER — Ambulatory Visit: Payer: Medicaid Other | Admitting: Internal Medicine

## 2022-12-25 NOTE — Progress Notes (Signed)
Internal Medicine Clinic Attending  I saw and evaluated the patient.  I personally confirmed the key portions of the history and exam documented by Dr. Sridharan and I reviewed pertinent patient test results.  The assessment, diagnosis, and plan were formulated together and I agree with the documentation in the resident's note.  

## 2022-12-26 MED ORDER — ROSUVASTATIN CALCIUM 20 MG PO TABS: 20.0000 mg | ORAL_TABLET | Freq: Every day | ORAL | 2 refills | Status: DC

## 2022-12-26 NOTE — Assessment & Plan Note (Signed)
Addendum: LDL persistently elevated, switched to crestor 20 for better control towards goal < 70 (hx CVA)

## 2022-12-26 NOTE — Addendum Note (Signed)
Addended byLinus Galas on: 12/26/2022 02:18 PM   Modules accepted: Orders

## 2022-12-29 DIAGNOSIS — Z7689 Persons encountering health services in other specified circumstances: Secondary | ICD-10-CM | POA: Diagnosis not present

## 2022-12-29 DIAGNOSIS — J3089 Other allergic rhinitis: Secondary | ICD-10-CM | POA: Diagnosis not present

## 2022-12-29 DIAGNOSIS — J3081 Allergic rhinitis due to animal (cat) (dog) hair and dander: Secondary | ICD-10-CM | POA: Diagnosis not present

## 2022-12-29 DIAGNOSIS — J301 Allergic rhinitis due to pollen: Secondary | ICD-10-CM | POA: Diagnosis not present

## 2023-01-01 ENCOUNTER — Telehealth: Payer: Self-pay | Admitting: Adult Health

## 2023-01-01 ENCOUNTER — Other Ambulatory Visit (HOSPITAL_COMMUNITY): Payer: Self-pay

## 2023-01-01 ENCOUNTER — Other Ambulatory Visit: Payer: Self-pay | Admitting: Internal Medicine

## 2023-01-01 MED ORDER — BUDESONIDE-FORMOTEROL FUMARATE 80-4.5 MCG/ACT IN AERO: 2.0000 | INHALATION_SPRAY | Freq: Two times a day (BID) | RESPIRATORY_TRACT | 5 refills | Status: DC

## 2023-01-01 NOTE — Telephone Encounter (Signed)
Request refill of symbicort to pharm

## 2023-01-02 ENCOUNTER — Telehealth: Payer: Self-pay | Admitting: Adult Health

## 2023-01-03 ENCOUNTER — Other Ambulatory Visit (HOSPITAL_COMMUNITY): Payer: Self-pay

## 2023-01-03 ENCOUNTER — Other Ambulatory Visit (HOSPITAL_BASED_OUTPATIENT_CLINIC_OR_DEPARTMENT_OTHER): Payer: Self-pay

## 2023-01-03 MED ORDER — BUDESONIDE-FORMOTEROL FUMARATE 80-4.5 MCG/ACT IN AERO: 2.0000 | INHALATION_SPRAY | Freq: Two times a day (BID) | RESPIRATORY_TRACT | 5 refills | Status: DC

## 2023-01-05 ENCOUNTER — Other Ambulatory Visit (HOSPITAL_COMMUNITY): Payer: Self-pay

## 2023-01-05 DIAGNOSIS — J3081 Allergic rhinitis due to animal (cat) (dog) hair and dander: Secondary | ICD-10-CM | POA: Diagnosis not present

## 2023-01-05 DIAGNOSIS — Z7689 Persons encountering health services in other specified circumstances: Secondary | ICD-10-CM | POA: Diagnosis not present

## 2023-01-05 DIAGNOSIS — J301 Allergic rhinitis due to pollen: Secondary | ICD-10-CM | POA: Diagnosis not present

## 2023-01-05 DIAGNOSIS — J3089 Other allergic rhinitis: Secondary | ICD-10-CM | POA: Diagnosis not present

## 2023-01-09 ENCOUNTER — Other Ambulatory Visit: Payer: Self-pay

## 2023-01-09 ENCOUNTER — Other Ambulatory Visit (HOSPITAL_COMMUNITY): Payer: Self-pay

## 2023-01-12 DIAGNOSIS — J301 Allergic rhinitis due to pollen: Secondary | ICD-10-CM | POA: Diagnosis not present

## 2023-01-12 DIAGNOSIS — Z7689 Persons encountering health services in other specified circumstances: Secondary | ICD-10-CM | POA: Diagnosis not present

## 2023-01-12 DIAGNOSIS — J3081 Allergic rhinitis due to animal (cat) (dog) hair and dander: Secondary | ICD-10-CM | POA: Diagnosis not present

## 2023-01-12 DIAGNOSIS — J3089 Other allergic rhinitis: Secondary | ICD-10-CM | POA: Diagnosis not present

## 2023-01-15 ENCOUNTER — Ambulatory Visit (INDEPENDENT_AMBULATORY_CARE_PROVIDER_SITE_OTHER): Payer: Medicaid Other | Admitting: Internal Medicine

## 2023-01-15 ENCOUNTER — Other Ambulatory Visit: Payer: Self-pay

## 2023-01-15 ENCOUNTER — Encounter: Payer: Self-pay | Admitting: Internal Medicine

## 2023-01-15 VITALS — BP 110/82 | HR 85 | Temp 98.0°F | Wt 97.0 lb

## 2023-01-15 DIAGNOSIS — E43 Unspecified severe protein-calorie malnutrition: Secondary | ICD-10-CM | POA: Diagnosis not present

## 2023-01-15 DIAGNOSIS — Z7689 Persons encountering health services in other specified circumstances: Secondary | ICD-10-CM | POA: Diagnosis not present

## 2023-01-15 DIAGNOSIS — B20 Human immunodeficiency virus [HIV] disease: Secondary | ICD-10-CM

## 2023-01-15 DIAGNOSIS — Z79899 Other long term (current) drug therapy: Secondary | ICD-10-CM

## 2023-01-15 DIAGNOSIS — R634 Abnormal weight loss: Secondary | ICD-10-CM | POA: Diagnosis not present

## 2023-01-15 NOTE — Progress Notes (Unsigned)
RFV: follow up for hiv disease  Patient ID: Janice Williams, female   DOB: May 22, 1961, 62 y.o.   MRN: [id number]  HPI 62yo F with hiv disease, well controlled on biktarvy  Loss of appetite from recent sinus infection last and unintentional weight loss ( 97 now down from 118 in July)  Did not tolerate marinol  Outpatient Encounter Medications as of 01/15/2023  Medication Sig   amLODipine (NORVASC) 10 MG tablet Take 1 tablet (10 mg total) by mouth daily.   bictegravir-emtricitabine-tenofovir AF (BIKTARVY) 50-200-25 MG TABS tablet Take 1 tablet by mouth daily.   budesonide-formoterol (SYMBICORT) 80-4.5 MCG/ACT inhaler Inhale 2 puffs into the lungs 2 (two) times daily.   docusate sodium (COLACE) 100 MG capsule Take 1 capsule (100 mg total) by mouth daily as needed.   Ensure (ENSURE) Take 237 mLs by mouth 3 (three) times daily between meals.   EPIPEN 2-PAK 0.3 MG/0.3ML SOAJ injection    fluticasone (FLONASE) 50 MCG/ACT nasal spray Use 1 spray(s) in each nostril once daily   mirtazapine (REMERON) 15 MG tablet Take 1 tablet (15 mg total) by mouth at bedtime.   omeprazole (PRILOSEC) 20 MG capsule Take 1 capsule (20 mg total) by mouth daily.   ondansetron (ZOFRAN) 4 MG tablet Take 1 tablet (4 mg total) by mouth daily as needed for nausea or vomiting.   RESTASIS 0.05 % ophthalmic emulsion 1 drop 2 (two) times daily.   rosuvastatin (CRESTOR) 20 MG tablet Take 1 tablet (20 mg total) by mouth daily.   traMADol (ULTRAM) 50 MG tablet Take 1 tablet (50 mg total) by mouth every 6 (six) hours as needed.   albuterol (VENTOLIN HFA) 108 (90 Base) MCG/ACT inhaler Inhale 2 puffs into the lungs every 6 (six) hours as needed for wheezing or shortness of breath. (Patient not taking: Reported on 11/16/2022)   benzonatate (TESSALON) 100 MG capsule Take 1 capsule (100 mg total) by mouth every 8 (eight) hours. (Patient not taking: Reported on 01/15/2023)   PRESCRIPTION MEDICATION Inject 1 Dose into the skin once a  week. Allergy shot in office on Fridays   No facility-administered encounter medications on file as of 01/15/2023.     Patient Active Problem List   Diagnosis Date Noted   Epidermal inclusion cyst 12/22/2022   Rash of hand 12/21/2022   Former cigarette smoker 11/16/2022   Ankle swelling, left 06/28/2022   Sprain of calcaneofibular ligament 02/28/2022   Need for shingles vaccine 08/14/2021   Constipation 05/12/2021   Cough 03/30/2021   Screening for cervical cancer 03/18/2021   Ear pain, bilateral 03/17/2021   Degenerative disc disease, cervical 10/13/2020   Chronic, continuous use of opioids 09/10/2020   Rhinitis, chronic 06/10/2020   Restless legs syndrome (RLS) 01/08/2020   Osteoporosis 10/10/2019   COPD  GOLD 3  07/08/2019   Moderate protein-calorie malnutrition ([place]) 05/31/2018   Hypertension 06/08/2017   Left knee pain 03/15/2015   Healthcare maintenance 07/06/2011   Hyperlipidemia 06/03/2009   WEIGHT LOSS 06/03/2009   Human immunodeficiency virus (HIV) disease ([place]) 10/29/2008   GERD 10/29/2008   CEREBROVASCULAR ACCIDENT, HX OF 10/29/2008   TOBACCO DEPENDENCE 02/14/2007   Allergic rhinitis 02/14/2007   Symptoms concerning nutrition, metabolism, and development 02/14/2007     Health Maintenance Due  Topic Date Due   Zoster Vaccines- Shingrix (1 of 2) Never done   DTaP/Tdap/Td (3 - Td or Tdap) 06/06/2022   COVID-19 Vaccine (4 - 2023-24 season) 08/18/2022     Review of Systems Unintentional  weight loss. 12 point ros is otherwise negative. Resolved sinus infeciton Physical Exam   BP 110/82   Pulse 85   Temp 98 F (36.7 C) (Oral)   Wt 97 lb (44 kg)   SpO2 95%   BMI 17.18 kg/m   Physical Exam  Constitutional:  oriented to person, place, and time. appears well-developed and malnourished. No distress.  HENT: [place]/AT, PERRLA, no scleral icterus Mouth/Throat: Oropharynx is clear and moist. No oropharyngeal exudate.  Cardiovascular: Normal rate, regular rhythm and  normal heart sounds. Exam reveals no gallop and no friction rub.  No murmur heard.  Pulmonary/Chest: Effort normal and breath sounds normal. No respiratory distress.  has no wheezes.  Neck = supple, no nuchal rigidity Abdominal: Soft. Bowel sounds are normal.  exhibits no distension. There is no tenderness.  Lymphadenopathy: no cervical adenopathy. No axillary adenopathy Neurological: alert and oriented to person, place, and time.  Skin: Skin is warm and dry. No rash noted. No erythema.  Psychiatric: a normal mood and affect.  behavior is normal.   Lab Results  Component Value Date   CD4TCELL 44 01/30/2022   Lab Results  Component Value Date   CD4TABS 899 08/01/2021   CD4TABS 682 03/07/2021   CD4TABS 886 09/22/2020   Lab Results  Component Value Date   HIV1RNAQUANT 36 (H) 01/30/2022   Lab Results  Component Value Date   HEPBSAB NEG 12/28/2014   Lab Results  Component Value Date   LABRPR NON-REACTIVE 01/30/2022    CBC Lab Results  Component Value Date   WBC 5.4 06/28/2022   RBC 3.72 (L) 06/28/2022   HGB 12.1 06/28/2022   HCT 36.3 06/28/2022   PLT 377 06/28/2022   MCV 97.6 06/28/2022   MCH 32.5 06/28/2022   MCHC 33.3 06/28/2022   RDW 13.2 06/28/2022   LYMPHSABS 2.0 06/28/2022   MONOABS 0.6 06/28/2022   EOSABS 0.1 06/28/2022    BMET Lab Results  Component Value Date   NA 140 12/21/2022   K 5.0 12/21/2022   CL 100 12/21/2022   CO2 25 12/21/2022   GLUCOSE 91 12/21/2022   BUN 9 12/21/2022   CREATININE 0.87 12/21/2022   CALCIUM 10.1 12/21/2022   GFRNONAA 52 (L) 06/28/2022   GFRAA 65 03/07/2021      Assessment and Plan Hiv disease = needs annual labs. Likely undetectable plan to refill ART  Low bmi, severe protein caloric malnutrition = drinks 3 cans of ensure, plus meals and mirtazipine at night Has nutritionist.. maybe lactaid ice cream. Eat cookies! More peanut butter with crackers.  Unable to afford rsv vaccine  since medicaid causes $400  Health  maintenance = up to date.

## 2023-01-16 LAB — T-HELPER CELL (CD4) - (RCID CLINIC ONLY)
CD4 % Helper T Cell: 43 % (ref 33–65)
CD4 T Cell Abs: 674 /uL (ref 400–1790)

## 2023-01-17 LAB — CBC WITH DIFFERENTIAL/PLATELET
Absolute Monocytes: 455 cells/uL (ref 200–950)
Basophils Absolute: 21 cells/uL (ref 0–200)
Basophils Relative: 0.5 %
Eosinophils Absolute: 29 cells/uL (ref 15–500)
Eosinophils Relative: 0.7 %
HCT: 36.6 % (ref 35.0–45.0)
Hemoglobin: 12.4 g/dL (ref 11.7–15.5)
Lymphs Abs: 1710 cells/uL (ref 850–3900)
MCH: 32.1 pg (ref 27.0–33.0)
MCHC: 33.9 g/dL (ref 32.0–36.0)
MCV: 94.8 fL (ref 80.0–100.0)
MPV: 10.2 fL (ref 7.5–12.5)
Monocytes Relative: 11.1 %
Neutro Abs: 1886 cells/uL (ref 1500–7800)
Neutrophils Relative %: 46 %
Platelets: 277 10*3/uL (ref 140–400)
RBC: 3.86 10*6/uL (ref 3.80–5.10)
RDW: 13.1 % (ref 11.0–15.0)
Total Lymphocyte: 41.7 %
WBC: 4.1 10*3/uL (ref 3.8–10.8)

## 2023-01-17 LAB — COMPLETE METABOLIC PANEL WITH GFR
AG Ratio: 1.6 (calc) (ref 1.0–2.5)
ALT: 6 U/L (ref 6–29)
AST: 17 U/L (ref 10–35)
Albumin: 4.5 g/dL (ref 3.6–5.1)
Alkaline phosphatase (APISO): 61 U/L (ref 37–153)
BUN: 14 mg/dL (ref 7–25)
CO2: 28 mmol/L (ref 20–32)
Calcium: 9.5 mg/dL (ref 8.6–10.4)
Chloride: 102 mmol/L (ref 98–110)
Creat: 0.93 mg/dL (ref 0.50–1.05)
Globulin: 2.8 g/dL (calc) (ref 1.9–3.7)
Glucose, Bld: 112 mg/dL — ABNORMAL HIGH (ref 65–99)
Potassium: 4.1 mmol/L (ref 3.5–5.3)
Sodium: 140 mmol/L (ref 135–146)
Total Bilirubin: 0.5 mg/dL (ref 0.2–1.2)
Total Protein: 7.3 g/dL (ref 6.1–8.1)
eGFR: 69 mL/min/{1.73_m2} (ref >=60)

## 2023-01-17 LAB — HIV-1 RNA QUANT-NO REFLEX-BLD
HIV 1 RNA Quant: <20 Copies/mL — ABNORMAL HIGH
HIV-1 RNA Quant, Log: <1.3 Log cps/mL — ABNORMAL HIGH

## 2023-01-17 LAB — RPR: RPR Ser Ql: NONREACTIVE

## 2023-01-18 ENCOUNTER — Other Ambulatory Visit: Payer: Self-pay

## 2023-01-18 DIAGNOSIS — F1190 Opioid use, unspecified, uncomplicated: Secondary | ICD-10-CM

## 2023-01-18 DIAGNOSIS — Z419 Encounter for procedure for purposes other than remedying health state, unspecified: Secondary | ICD-10-CM | POA: Diagnosis not present

## 2023-01-19 DIAGNOSIS — Z7689 Persons encountering health services in other specified circumstances: Secondary | ICD-10-CM | POA: Diagnosis not present

## 2023-01-19 DIAGNOSIS — J301 Allergic rhinitis due to pollen: Secondary | ICD-10-CM | POA: Diagnosis not present

## 2023-01-19 DIAGNOSIS — J3089 Other allergic rhinitis: Secondary | ICD-10-CM | POA: Diagnosis not present

## 2023-01-19 DIAGNOSIS — J3081 Allergic rhinitis due to animal (cat) (dog) hair and dander: Secondary | ICD-10-CM | POA: Diagnosis not present

## 2023-01-22 ENCOUNTER — Other Ambulatory Visit: Payer: Self-pay | Admitting: *Deleted

## 2023-01-22 DIAGNOSIS — F1190 Opioid use, unspecified, uncomplicated: Secondary | ICD-10-CM

## 2023-01-22 MED ORDER — TRAMADOL HCL 50 MG PO TABS: 50.0000 mg | ORAL_TABLET | Freq: Four times a day (QID) | ORAL | 0 refills | Status: DC | PRN

## 2023-01-22 NOTE — Telephone Encounter (Signed)
Pt called / informed of refill of Tramadol.

## 2023-01-22 NOTE — Telephone Encounter (Signed)
Return pt's call - stated she had called last Thursday for refill on Tramadol. Please refill today. Thanks

## 2023-01-22 NOTE — Telephone Encounter (Signed)
Patient calling to find out about her refill request from 2/1 for Tramadol. Can someone call her.

## 2023-01-23 ENCOUNTER — Other Ambulatory Visit: Payer: Self-pay

## 2023-01-23 ENCOUNTER — Telehealth: Payer: Self-pay

## 2023-01-23 ENCOUNTER — Other Ambulatory Visit: Payer: Self-pay | Admitting: Student

## 2023-01-23 DIAGNOSIS — F1190 Opioid use, unspecified, uncomplicated: Secondary | ICD-10-CM

## 2023-01-23 DIAGNOSIS — R634 Abnormal weight loss: Secondary | ICD-10-CM

## 2023-01-23 MED ORDER — TRAMADOL HCL 50 MG PO TABS: 50.0000 mg | ORAL_TABLET | Freq: Four times a day (QID) | ORAL | 0 refills | Status: DC | PRN

## 2023-01-23 MED ORDER — ENSURE PO LIQD: 237.0000 mL | Freq: Three times a day (TID) | ORAL | 5 refills | Status: DC

## 2023-01-23 NOTE — Telephone Encounter (Signed)
Refill sent in for tramadol. PDMP reviewed and appropriate.

## 2023-01-24 MED ORDER — TRAMADOL HCL 50 MG PO TABS: 50.0000 mg | ORAL_TABLET | Freq: Four times a day (QID) | ORAL | 0 refills | Status: DC | PRN

## 2023-01-24 NOTE — Telephone Encounter (Signed)
Called patient but she did not answer. Left message for patient to call back. RX for Symbicort was sent in on [facility] to [facility] [address].

## 2023-01-24 NOTE — Addendum Note (Signed)
Addended by: Velora Heckler on: 01/24/2023 09:21 AM   Modules accepted: Orders

## 2023-01-24 NOTE — Addendum Note (Signed)
Addended by: Riesa Pope on: 01/24/2023 09:46 AM   Modules accepted: Orders

## 2023-01-24 NOTE — Telephone Encounter (Signed)
Patient called in upset that she hasn't received her pain med. Explained it was sent to [facility] [address] yesterday. [facility] [address] All other meds go to that [place]. [facility]. All other pharmacies removed from list except [facility] where she gets [employer].  Rx for Tramadol cancelled with [place] at [facility].

## 2023-01-24 NOTE — Telephone Encounter (Signed)
Patient notified and is very Patent attorney.

## 2023-01-24 NOTE — Telephone Encounter (Signed)
[facility]

## 2023-01-26 DIAGNOSIS — J301 Allergic rhinitis due to pollen: Secondary | ICD-10-CM | POA: Diagnosis not present

## 2023-01-26 DIAGNOSIS — J3089 Other allergic rhinitis: Secondary | ICD-10-CM | POA: Diagnosis not present

## 2023-01-26 DIAGNOSIS — J3081 Allergic rhinitis due to animal (cat) (dog) hair and dander: Secondary | ICD-10-CM | POA: Diagnosis not present

## 2023-01-26 DIAGNOSIS — Z7689 Persons encountering health services in other specified circumstances: Secondary | ICD-10-CM | POA: Diagnosis not present

## 2023-01-31 ENCOUNTER — Other Ambulatory Visit: Payer: Self-pay

## 2023-01-31 ENCOUNTER — Other Ambulatory Visit (HOSPITAL_COMMUNITY): Payer: Self-pay

## 2023-01-31 ENCOUNTER — Other Ambulatory Visit: Payer: Self-pay | Admitting: Internal Medicine

## 2023-01-31 DIAGNOSIS — B20 Human immunodeficiency virus [HIV] disease: Secondary | ICD-10-CM

## 2023-01-31 MED ORDER — BIKTARVY 50-200-25 MG PO TABS
1.0000 | ORAL_TABLET | Freq: Every day | ORAL | 4 refills | Status: DC
  Filled 2023-01-31: qty 30, 30d supply, fill #0
  Filled 2023-02-28: qty 30, 30d supply, fill #1
  Filled 2023-03-30: qty 30, 30d supply, fill #2
  Filled 2023-04-25: qty 30, 30d supply, fill #3
  Filled 2023-05-18 – 2023-05-29 (×2): qty 30, 30d supply, fill #4

## 2023-02-01 MED ORDER — MIRTAZAPINE 15 MG PO TABS: 15.0000 mg | ORAL_TABLET | Freq: Every day | ORAL | 0 refills | Status: DC

## 2023-02-02 ENCOUNTER — Other Ambulatory Visit: Payer: Self-pay

## 2023-02-02 DIAGNOSIS — J3081 Allergic rhinitis due to animal (cat) (dog) hair and dander: Secondary | ICD-10-CM | POA: Diagnosis not present

## 2023-02-02 DIAGNOSIS — J301 Allergic rhinitis due to pollen: Secondary | ICD-10-CM | POA: Diagnosis not present

## 2023-02-02 DIAGNOSIS — Z7689 Persons encountering health services in other specified circumstances: Secondary | ICD-10-CM | POA: Diagnosis not present

## 2023-02-02 DIAGNOSIS — J3089 Other allergic rhinitis: Secondary | ICD-10-CM | POA: Diagnosis not present

## 2023-02-09 DIAGNOSIS — J301 Allergic rhinitis due to pollen: Secondary | ICD-10-CM | POA: Diagnosis not present

## 2023-02-09 DIAGNOSIS — J3089 Other allergic rhinitis: Secondary | ICD-10-CM | POA: Diagnosis not present

## 2023-02-09 DIAGNOSIS — J3081 Allergic rhinitis due to animal (cat) (dog) hair and dander: Secondary | ICD-10-CM | POA: Diagnosis not present

## 2023-02-09 DIAGNOSIS — Z7689 Persons encountering health services in other specified circumstances: Secondary | ICD-10-CM | POA: Diagnosis not present

## 2023-02-16 DIAGNOSIS — J301 Allergic rhinitis due to pollen: Secondary | ICD-10-CM | POA: Diagnosis not present

## 2023-02-16 DIAGNOSIS — J3081 Allergic rhinitis due to animal (cat) (dog) hair and dander: Secondary | ICD-10-CM | POA: Diagnosis not present

## 2023-02-16 DIAGNOSIS — Z419 Encounter for procedure for purposes other than remedying health state, unspecified: Secondary | ICD-10-CM | POA: Diagnosis not present

## 2023-02-16 DIAGNOSIS — Z7689 Persons encountering health services in other specified circumstances: Secondary | ICD-10-CM | POA: Diagnosis not present

## 2023-02-16 DIAGNOSIS — J3089 Other allergic rhinitis: Secondary | ICD-10-CM | POA: Diagnosis not present

## 2023-02-19 ENCOUNTER — Other Ambulatory Visit: Payer: Self-pay

## 2023-02-19 DIAGNOSIS — F1190 Opioid use, unspecified, uncomplicated: Secondary | ICD-10-CM

## 2023-02-21 ENCOUNTER — Other Ambulatory Visit: Payer: Self-pay | Admitting: *Deleted

## 2023-02-21 ENCOUNTER — Other Ambulatory Visit: Payer: Self-pay | Admitting: Student

## 2023-02-21 DIAGNOSIS — F1190 Opioid use, unspecified, uncomplicated: Secondary | ICD-10-CM

## 2023-02-21 MED ORDER — TRAMADOL HCL 50 MG PO TABS: 50.0000 mg | ORAL_TABLET | Freq: Four times a day (QID) | ORAL | 0 refills | Status: DC | PRN

## 2023-02-21 NOTE — Telephone Encounter (Signed)
Call from patient requesting refill on her Tramadol.  Stated was told she had missed an appointment but has not missed any appointments.  States has Arthritis and will keep appointment scheduled for 02/28/2023 with Dr. Jodi Mourning.

## 2023-02-22 ENCOUNTER — Other Ambulatory Visit: Payer: Self-pay

## 2023-02-22 ENCOUNTER — Other Ambulatory Visit: Payer: Self-pay | Admitting: Student

## 2023-02-22 DIAGNOSIS — F1190 Opioid use, unspecified, uncomplicated: Secondary | ICD-10-CM

## 2023-02-22 MED ORDER — TRAMADOL HCL 50 MG PO TABS: 50.0000 mg | ORAL_TABLET | Freq: Four times a day (QID) | ORAL | 0 refills | Status: DC | PRN

## 2023-02-22 NOTE — Telephone Encounter (Signed)
Pt states Tramadol was sent to the wrong pharmacy, it needs to be at [facility] on [address]. Pt states medication cannot be transfer. Please re-send the Rx.

## 2023-02-22 NOTE — Telephone Encounter (Signed)
Call from patient states that refill for the Tramadol was sent to the [facility] which does not have the tablets.  Would like for [facility] [address].  Will also need prescription to be written by someone other than Dr. Jodi Mourning as he is not covered under patient's insurance.

## 2023-02-23 ENCOUNTER — Ambulatory Visit: Payer: Medicaid Other

## 2023-02-23 DIAGNOSIS — J301 Allergic rhinitis due to pollen: Secondary | ICD-10-CM | POA: Diagnosis not present

## 2023-02-23 DIAGNOSIS — L720 Epidermal cyst: Secondary | ICD-10-CM | POA: Diagnosis not present

## 2023-02-23 DIAGNOSIS — J3081 Allergic rhinitis due to animal (cat) (dog) hair and dander: Secondary | ICD-10-CM | POA: Diagnosis not present

## 2023-02-23 DIAGNOSIS — Z7689 Persons encountering health services in other specified circumstances: Secondary | ICD-10-CM | POA: Diagnosis not present

## 2023-02-23 DIAGNOSIS — J3089 Other allergic rhinitis: Secondary | ICD-10-CM | POA: Diagnosis not present

## 2023-02-23 DIAGNOSIS — L301 Dyshidrosis [pompholyx]: Secondary | ICD-10-CM | POA: Diagnosis not present

## 2023-02-25 NOTE — Progress Notes (Signed)
CC: Weight Loss  HPI:   [name] is a 62 y.o. female with a past medical history of hypertension, HIV, CVA, COPD, and anemia who presents for overdue follow-up. She was last seen at [facility] in 12-2022.   Past Medical History:  Diagnosis Date   Anemia    Arthritis    generalized   Blood transfusion without reported diagnosis 2003   Bronchitis    Cataract    COPD (chronic obstructive pulmonary disease) ([place])    COPD with acute exacerbation ([place]) 06/09/2017   Dental abscess 03/15/2015   DYSPEPSIA 02/14/2007   Qualifier: Diagnosis of  By: Samara Snide     EXTERNAL OTITIS 01/06/2010   Qualifier: Diagnosis of  By: Tomma Lightning MD, [place] 02/04/2009   Qualifier: Diagnosis of  By: Rhunette Croft     Former smoker    quit 2014   GERD (gastroesophageal reflux disease)    Glaucoma    HIV infection ([place])    Hypertension    Osteoporosis    Seasonal allergies    Stroke ([place]) 2003   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, clean 7 years   SVD (spontaneous vaginal delivery)    x 3     Review of Systems:    Reports breath shortness, cough intermittently productive of clear sputum, and bilateral sinus-ear discomfort Denies wheezing, worsening cough, rhinorrhea, myalgia, diarrhea, and headache   Physical Exam:  Vitals:   02/28/23 0838  BP: 131/83  Pulse: 88  Temp: 97.8 F (36.6 C)  TempSrc: Oral  SpO2: 100%  Weight: 92 lb 1.6 oz (41.8 kg)  Height: '5\' 3"'$  (1.6 m)    General:   awake and alert, sitting comfortably in wheelchair, cooperative, not in acute distress Skin:   warm and dry, intact without any obvious lesions or scars, no rashes Ears:   pinnae normal, no discharge or external lesions, tympanic membranes nonbulging and nonerythematous Nose:   symmetrical, no external lesions or discharge Lungs:   normal respiratory effort, breathing unlabored, distant breath sounds, no crackles or wheezing Cardiac:   regular rate and rhythm, normal S1 and S2 Neurologic:    oriented to person-place-time, moving all extremities, no gross focal deficits Psychiatric:   euthymic mood with congruent affect, intelligible speech    Assessment & Plan:   COPD  GOLD 3  Patient has history of COPD managed with fluticasone, budesonide-formoterol, and as-needed albuterol. She reports daily adherence to her maintenance medications. Has been experiencing breath shortness, cough intermittently productive of clear sputum, and bilateral sinus-ear discomfort. Denies wheezing, worsening cough, rhinorrhea, and headache. Pulmonary exam notable only for diminished breath sounds without any wheezing. Low concern for acute exacerbation. Although patient has been taking her prescribed medications, a long-acting muscarinic antagonist is notably absent from her regimen. She would likely benefit from taking one.  - Continue albuterol 108ug q6 PRN - Continue budesonide-formoterol 80-4.5ug q12  - Start tiotropium 18ug q24    Rhinitis, chronic Patient continues to experience upper respiratory pressure-like discomfort including ear pain without associated hearing loss. She was evaluated by [facility] otolaryngology in 12-2021 who diagnosed her allergic rhinitis. At that time she was instructed to take levocetirizine, fluticasone, and Symbicort. She was also instructed to invest in an air humidifier, perform nasal irrigations, and use mupirocin ointment. The patient has been following all of this advice except for the mupirocin ointment, which she never received, but her symptoms persist. Otoscopic exam was unremarkable with nonerythematous nonbulging  tympanic membranes. Discussed importance of follow-up with otolaryngology or allergy for these persistent symptoms. Adding a long-acting muscarinic antagonist to her regimen may help improve some of these symptoms as well.  - Continue fluticasone 50ug q24 - Encourage follow-up with otolaryngology and allergy specialists     WEIGHT LOSS Patient  has lost about 27lbs between 04-2022 and 12-2022. At her last [facility] visit in 12-2022, she weighed 97lbs and current weight is 92lbs. She reports chronically poor appetite, which she attributes to her upper respiratory symptoms. Also experiencing breath shortness, intermittent nonproductive cough, hot flashes, lightheadedness, and ear discomfort. Denies any chills, headache, hearing loss, chest pain, hematochezia, melena, abdominal discomfort, diarrhea, and nausea. Chest CT was ordered at her last visit, but she never received a call to schedule it. Her dose of mirtazepine, prescribed for weight gain, was increased at her last visit. Overall she has been tolerating this medication well with only some mild lethargy. Of note, she also takes tramadol several times per day. History notable for colonoscopy performed in 04-2022 that only revealed two <14m sessile polyps. Former smoker, quit in 2014, though has a 37 pack-year history. Overall presentation is concerning for possible malignancy, most likely pulmonary given reported symptom profile and tobacco history. Low concern for gastrointestinal malignancy given lack of symptoms and recent colonoscopy findings. Discussed possibility of increasing mirtazepine dose to better stimulate appetite. Counseled on potential side effects including increased lethargy and shaking, tremors, or rigidity secondary to serotonin syndrome from concurrent tramadol use. Emphasized importance of CT imaging and will add abdominal-pelvic region. Also discussed strategies to increase caloric intake such as food selection, patient has upcoming visit with her nutritionist.  - Increase mitrazepine from '15mg'$  to '30mg'$  q24 while monitoring for side effects - Complete chest CT scan - Order abdominal-pelvic CT scan - Encourage introducing more calorically dense foods into diet - Return to clinic in one month    Hypertension Patient has history of hypertension managed with amlodipine, which she  reports daily adherence including earlier this morning. Denies lightheadedness, dizziness, blurry vision, headache, anxiety, chest pain, and palpitations. Her BP in clinic today was 131/83.  - Continue amlodipine '10mg'$  q24    Hyperlipidemia Patient has history of hyperlipidemia and CVA, most recent lipid panel collected 12-2022 with LDL 127 and TChol 238. At her last visit, she was started on rosuvastatin for better LDL control. She reports taking this medication daily and has been tolerating it well. Denies myalgias, fatigue, headache, nausea, and diarrhea. Repeat lipid panel in about one month with goal to maintain LDL<70.  - Continue rosuvastatin '20mg'$  q24 - Repeat lipid panel in one month        See Encounters Tab for problem based charting.  Patient discussed with Dr.  LSaverio Danker

## 2023-02-27 ENCOUNTER — Other Ambulatory Visit (HOSPITAL_COMMUNITY): Payer: Self-pay

## 2023-02-28 ENCOUNTER — Encounter: Payer: Self-pay | Admitting: Student

## 2023-02-28 ENCOUNTER — Ambulatory Visit (INDEPENDENT_AMBULATORY_CARE_PROVIDER_SITE_OTHER): Payer: Medicaid Other | Admitting: Student

## 2023-02-28 ENCOUNTER — Other Ambulatory Visit: Payer: Self-pay

## 2023-02-28 VITALS — BP 131/83 | HR 88 | Temp 97.8°F | Ht 63.0 in | Wt 92.1 lb

## 2023-02-28 DIAGNOSIS — J449 Chronic obstructive pulmonary disease, unspecified: Secondary | ICD-10-CM | POA: Diagnosis not present

## 2023-02-28 DIAGNOSIS — I10 Essential (primary) hypertension: Secondary | ICD-10-CM

## 2023-02-28 DIAGNOSIS — Z7689 Persons encountering health services in other specified circumstances: Secondary | ICD-10-CM | POA: Diagnosis not present

## 2023-02-28 DIAGNOSIS — R634 Abnormal weight loss: Secondary | ICD-10-CM

## 2023-02-28 DIAGNOSIS — E785 Hyperlipidemia, unspecified: Secondary | ICD-10-CM

## 2023-02-28 DIAGNOSIS — J310 Chronic rhinitis: Secondary | ICD-10-CM

## 2023-02-28 MED ORDER — ALBUTEROL SULFATE HFA 108 (90 BASE) MCG/ACT IN AERS: 2.0000 | INHALATION_SPRAY | Freq: Four times a day (QID) | RESPIRATORY_TRACT | 1 refills | Status: DC | PRN

## 2023-02-28 MED ORDER — BUDESONIDE-FORMOTEROL FUMARATE 80-4.5 MCG/ACT IN AERO: 2.0000 | INHALATION_SPRAY | Freq: Two times a day (BID) | RESPIRATORY_TRACT | 5 refills | Status: DC

## 2023-02-28 MED ORDER — TIOTROPIUM BROMIDE MONOHYDRATE 18 MCG IN CAPS: 18.0000 ug | ORAL_CAPSULE | Freq: Every day | RESPIRATORY_TRACT | 2 refills | Status: DC

## 2023-02-28 MED ORDER — MIRTAZAPINE 15 MG PO TABS: 30.0000 mg | ORAL_TABLET | Freq: Every day | ORAL | 0 refills | Status: DC

## 2023-02-28 NOTE — Assessment & Plan Note (Signed)
Patient has history of hyperlipidemia and CVA, most recent lipid panel collected 12-2022 with LDL 127 and TChol 238. At her last visit, she was started on rosuvastatin for better LDL control. She reports taking this medication daily and has been tolerating it well. Denies myalgias, fatigue, headache, nausea, and diarrhea. Repeat lipid panel in about one month with goal to maintain LDL<70.  - Continue rosuvastatin '20mg'$  q24 - Repeat lipid panel in one month

## 2023-02-28 NOTE — Assessment & Plan Note (Signed)
Patient has history of hypertension managed with amlodipine, which she reports daily adherence including earlier this morning. Denies lightheadedness, dizziness, blurry vision, headache, anxiety, chest pain, and palpitations. Her BP in clinic today was 131/83.  - Continue amlodipine '10mg'$  q24

## 2023-02-28 NOTE — Assessment & Plan Note (Signed)
Patient has lost about 27lbs between 04-2022 and 12-2022. At her last [facility] visit in 12-2022, she weighed 97lbs and current weight is 92lbs. She reports chronically poor appetite, which she attributes to her upper respiratory symptoms. Also experiencing breath shortness, intermittent nonproductive cough, hot flashes, lightheadedness, and ear discomfort. Denies any chills, headache, hearing loss, chest pain, hematochezia, melena, abdominal discomfort, diarrhea, and nausea. Chest CT was ordered at her last visit, but she never received a call to schedule it. Her dose of mirtazepine, prescribed for weight gain, was increased at her last visit. Overall she has been tolerating this medication well with only some mild lethargy. Of note, she also takes tramadol several times per day. History notable for colonoscopy performed in 04-2022 that only revealed two <59m sessile polyps. Former smoker, quit in 2014, though has a 37 pack-year history. Overall presentation is concerning for possible malignancy, most likely pulmonary given reported symptom profile and tobacco history. Low concern for gastrointestinal malignancy given lack of symptoms and recent colonoscopy findings. Discussed possibility of increasing mirtazepine dose to better stimulate appetite. Counseled on potential side effects including increased lethargy and shaking, tremors, or rigidity secondary to serotonin syndrome from concurrent tramadol use. Emphasized importance of CT imaging and will add abdominal-pelvic region. Also discussed strategies to increase caloric intake such as food selection, patient has upcoming visit with her nutritionist.  - Increase mitrazepine from '15mg'$  to '30mg'$  q24 while monitoring for side effects - Complete chest CT scan - Order abdominal-pelvic CT scan - Encourage introducing more calorically dense foods into diet - Return to clinic in one month

## 2023-02-28 NOTE — Assessment & Plan Note (Signed)
Patient continues to experience upper respiratory pressure-like discomfort including ear pain without associated hearing loss. She was evaluated by [facility] otolaryngology in 12-2021 who diagnosed her allergic rhinitis. At that time she was instructed to take levocetirizine, fluticasone, and Symbicort. She was also instructed to invest in an air humidifier, perform nasal irrigations, and use mupirocin ointment. The patient has been following all of this advice except for the mupirocin ointment, which she never received, but her symptoms persist. Otoscopic exam was unremarkable with nonerythematous nonbulging tympanic membranes. Discussed importance of follow-up with otolaryngology or allergy for these persistent symptoms. Adding a long-acting muscarinic antagonist to her regimen may help improve some of these symptoms as well.  - Continue fluticasone 50ug q24 - Encourage follow-up with otolaryngology and allergy specialists

## 2023-02-28 NOTE — Patient Instructions (Signed)
  Thank you, Ms.[name], for allowing [place] to provide your care today. Today we discussed . . .  > Weight Loss       - please ensure that you complete the chest and abdominal-pelvic CT scan to rule out cancer       - we are increasing the dose of your mirtazepine to 30mg  daily       - stop taking this medication if you become too tired or experience shaking, rigidity, or tremors       - try eating more calorically dense foods like almond butter, olive oil, and nuts > COPD       - we have refilled your albuterol and Symbicort, keep taking these medications       - we have prescribed another medication called tiotropium which may help your breathing    I have ordered the following labs for you:  Lab Orders  No laboratory test(s) ordered today      Tests ordered today:  CT abdomen pelvis   Referrals ordered today:   Referral Orders  No referral(s) requested today      I have ordered the following medication/changed the following medications:   Stop the following medications: Medications Discontinued During This Encounter  Medication Reason   albuterol (VENTOLIN HFA) 108 (90 Base) MCG/ACT inhaler Reorder   budesonide-formoterol (SYMBICORT) 80-4.5 MCG/ACT inhaler Reorder   mirtazapine (REMERON) 15 MG tablet Reorder     Start the following medications: Meds ordered this encounter  Medications   tiotropium (SPIRIVA HANDIHALER) 18 MCG inhalation capsule    Sig: Place 1 capsule (18 mcg total) into inhaler and inhale daily.    Dispense:  30 capsule    Refill:  2   albuterol (VENTOLIN HFA) 108 (90 Base) MCG/ACT inhaler    Sig: Inhale 2 puffs into the lungs every 6 (six) hours as needed for wheezing or shortness of breath.    Dispense:  18 g    Refill:  1   budesonide-formoterol (SYMBICORT) 80-4.5 MCG/ACT inhaler    Sig: Inhale 2 puffs into the lungs 2 (two) times daily.    Dispense:  1 each    Refill:  5   mirtazapine (REMERON) 15 MG tablet    Sig: Take 2 tablets  (30 mg total) by mouth at bedtime.    Dispense:  60 tablet    Refill:  0      Follow up:  1 month     Remember:  Please continue to take your current medications. We are increasing the dose of your mirtazepine, but let us know if you experience any side effects. We are also giving you a new medication called tiotropium that can help with your breathing. We will see you again in about 1 month!   Should you have any questions or concerns please call the internal medicine clinic at [phone].     Roswell Nickel, MD [place]

## 2023-02-28 NOTE — Assessment & Plan Note (Addendum)
Patient has history of COPD managed with fluticasone, budesonide-formoterol, and as-needed albuterol. She reports daily adherence to her maintenance medications. Has been experiencing breath shortness, cough intermittently productive of clear sputum, and bilateral sinus-ear discomfort. Denies wheezing, worsening cough, rhinorrhea, and headache. Pulmonary exam notable only for diminished breath sounds without any wheezing. Low concern for acute exacerbation. Although patient has been taking her prescribed medications, a long-acting muscarinic antagonist is notably absent from her regimen. She would likely benefit from taking one.  - Continue albuterol 108ug q6 PRN - Continue budesonide-formoterol 80-4.5ug q12  - Start tiotropium 18ug q24

## 2023-03-01 ENCOUNTER — Telehealth: Payer: Self-pay | Admitting: Internal Medicine

## 2023-03-01 DIAGNOSIS — Z87891 Personal history of nicotine dependence: Secondary | ICD-10-CM

## 2023-03-01 NOTE — Telephone Encounter (Signed)
Called the pt and there was no answer- LMTCB    

## 2023-03-01 NOTE — Telephone Encounter (Signed)
Called and spoke with patient. She stated that she was seen by her PCP Dr. Jodi Mourning yesterday. He prescribed Spiriva 40mg for her to use in additional to her Symbicort 820m. She has not started the Spiriva yet because she wanted to double check with Dr. WeMelvyn Novas  Dr. WeMelvyn Novascan you please advise if you are ok with her using the Spiriva 1858min addition to her Symbicort? Thanks!

## 2023-03-01 NOTE — Telephone Encounter (Signed)
Pt. Called back to get advise she missed the call

## 2023-03-01 NOTE — Telephone Encounter (Signed)
Yes but needs to return with all active inhalers to regroup and be sure using all of them correclty

## 2023-03-01 NOTE — Telephone Encounter (Signed)
Pt. Calling about her inhalers she was given Trelgy by PCP yesterday and was told to take that and Symbicort as well and she want to check with [name] to make sure that's ok for she does this

## 2023-03-02 ENCOUNTER — Other Ambulatory Visit: Payer: Self-pay

## 2023-03-02 ENCOUNTER — Telehealth: Payer: Self-pay

## 2023-03-02 DIAGNOSIS — J3081 Allergic rhinitis due to animal (cat) (dog) hair and dander: Secondary | ICD-10-CM | POA: Diagnosis not present

## 2023-03-02 DIAGNOSIS — J3089 Other allergic rhinitis: Secondary | ICD-10-CM | POA: Diagnosis not present

## 2023-03-02 DIAGNOSIS — J301 Allergic rhinitis due to pollen: Secondary | ICD-10-CM | POA: Diagnosis not present

## 2023-03-02 NOTE — Telephone Encounter (Signed)
Decision:Approved Sonnie Alamo (Key: [facility]) PA Case ID #: [id number] Rx #: [id number] Need Help? Call us at [phone] Outcome Approved today Approved. This drug is covered on the Preferred Drug List. It does not require prior approval. You can get 30 capsules per 30 days. Please call the pharmacy to process the claim. Authorization Expiration Date: 12/18/2023 Drug Tiotropium Bromide Monohydrate 18MCG capsules ePA cloud logo Form [facility] Medicaid of [place] Prior Authorization Request Form [phone] NCPDP) Original Claim Info 75 Submit 3 DS For Emerg Fill with PA Type01, PA Number [id number], Level of Service 3

## 2023-03-02 NOTE — Addendum Note (Signed)
Addended by: Charise Killian on: 03/02/2023 01:04 PM   Modules accepted: Level of Service

## 2023-03-02 NOTE — Telephone Encounter (Signed)
Spoke with pt who states she has not started taking Spiriva because she does not want the use any powder inhalers. PT was scheduled for OV on 04/17/23 to review inhalers. Nothing further needed at this time.

## 2023-03-02 NOTE — Telephone Encounter (Signed)
Prior Authorization for patient (Tiotropium Bromide Monohydrate) came through on cover my meds was submitted with last office notes awaiting approval or denial

## 2023-03-02 NOTE — Progress Notes (Signed)
Internal Medicine Clinic Attending  Case discussed with Dr. Harper  At the time of the visit.  We reviewed the resident's history and exam and pertinent patient test results.  I agree with the assessment, diagnosis, and plan of care documented in the resident's note.  

## 2023-03-09 DIAGNOSIS — J3089 Other allergic rhinitis: Secondary | ICD-10-CM | POA: Diagnosis not present

## 2023-03-09 DIAGNOSIS — Z7689 Persons encountering health services in other specified circumstances: Secondary | ICD-10-CM | POA: Diagnosis not present

## 2023-03-09 DIAGNOSIS — J3081 Allergic rhinitis due to animal (cat) (dog) hair and dander: Secondary | ICD-10-CM | POA: Diagnosis not present

## 2023-03-09 DIAGNOSIS — J301 Allergic rhinitis due to pollen: Secondary | ICD-10-CM | POA: Diagnosis not present

## 2023-03-12 DIAGNOSIS — J4530 Mild persistent asthma, uncomplicated: Secondary | ICD-10-CM | POA: Diagnosis not present

## 2023-03-12 DIAGNOSIS — J301 Allergic rhinitis due to pollen: Secondary | ICD-10-CM | POA: Diagnosis not present

## 2023-03-12 DIAGNOSIS — H1045 Other chronic allergic conjunctivitis: Secondary | ICD-10-CM | POA: Diagnosis not present

## 2023-03-12 DIAGNOSIS — J3089 Other allergic rhinitis: Secondary | ICD-10-CM | POA: Diagnosis not present

## 2023-03-19 DIAGNOSIS — Z419 Encounter for procedure for purposes other than remedying health state, unspecified: Secondary | ICD-10-CM | POA: Diagnosis not present

## 2023-03-20 DIAGNOSIS — H5213 Myopia, bilateral: Secondary | ICD-10-CM | POA: Diagnosis not present

## 2023-03-21 ENCOUNTER — Other Ambulatory Visit: Payer: Self-pay

## 2023-03-21 DIAGNOSIS — K219 Gastro-esophageal reflux disease without esophagitis: Secondary | ICD-10-CM

## 2023-03-21 MED ORDER — OMEPRAZOLE 20 MG PO CPDR: 20.0000 mg | DELAYED_RELEASE_CAPSULE | Freq: Every day | ORAL | 0 refills | Status: DC

## 2023-03-23 ENCOUNTER — Other Ambulatory Visit: Payer: Self-pay

## 2023-03-23 DIAGNOSIS — J3089 Other allergic rhinitis: Secondary | ICD-10-CM | POA: Diagnosis not present

## 2023-03-23 DIAGNOSIS — F1190 Opioid use, unspecified, uncomplicated: Secondary | ICD-10-CM

## 2023-03-23 DIAGNOSIS — J301 Allergic rhinitis due to pollen: Secondary | ICD-10-CM | POA: Diagnosis not present

## 2023-03-23 DIAGNOSIS — J3081 Allergic rhinitis due to animal (cat) (dog) hair and dander: Secondary | ICD-10-CM | POA: Diagnosis not present

## 2023-03-23 DIAGNOSIS — Z7689 Persons encountering health services in other specified circumstances: Secondary | ICD-10-CM | POA: Diagnosis not present

## 2023-03-25 MED ORDER — TRAMADOL HCL 50 MG PO TABS: 50.0000 mg | ORAL_TABLET | Freq: Four times a day (QID) | ORAL | 0 refills | Status: DC | PRN

## 2023-03-26 ENCOUNTER — Telehealth: Payer: Self-pay

## 2023-03-26 ENCOUNTER — Other Ambulatory Visit: Payer: Self-pay | Admitting: *Deleted

## 2023-03-26 DIAGNOSIS — F1190 Opioid use, unspecified, uncomplicated: Secondary | ICD-10-CM

## 2023-03-26 NOTE — Telephone Encounter (Signed)
Patient called regarding her rx for tramadol, rx was sent to the wrong pharmacy please send rx to correct pharmacy: [id number] - [place], [place] - [address]

## 2023-03-27 ENCOUNTER — Other Ambulatory Visit: Payer: Self-pay

## 2023-03-27 DIAGNOSIS — F1190 Opioid use, unspecified, uncomplicated: Secondary | ICD-10-CM

## 2023-03-27 MED ORDER — TRAMADOL HCL 50 MG PO TABS: 50.0000 mg | ORAL_TABLET | Freq: Four times a day (QID) | ORAL | 0 refills | Status: DC | PRN

## 2023-03-27 NOTE — Telephone Encounter (Signed)
Done. Thank you.

## 2023-03-29 ENCOUNTER — Other Ambulatory Visit (HOSPITAL_COMMUNITY): Payer: Self-pay

## 2023-03-30 ENCOUNTER — Other Ambulatory Visit (HOSPITAL_COMMUNITY): Payer: Self-pay

## 2023-03-30 ENCOUNTER — Telehealth: Payer: Self-pay

## 2023-03-30 DIAGNOSIS — J3081 Allergic rhinitis due to animal (cat) (dog) hair and dander: Secondary | ICD-10-CM | POA: Diagnosis not present

## 2023-03-30 DIAGNOSIS — J301 Allergic rhinitis due to pollen: Secondary | ICD-10-CM | POA: Diagnosis not present

## 2023-03-30 DIAGNOSIS — J3089 Other allergic rhinitis: Secondary | ICD-10-CM | POA: Diagnosis not present

## 2023-03-30 DIAGNOSIS — Z7689 Persons encountering health services in other specified circumstances: Secondary | ICD-10-CM | POA: Diagnosis not present

## 2023-03-30 NOTE — Telephone Encounter (Signed)
..   Medicaid Managed Care   Unsuccessful Outreach Note  03/30/2023 Name: Janice Williams MRN: [id number] DOB: 01-Jan-1961  Referred by: Crissie Sickles, MD Reason for referral : Appointment   An unsuccessful telephone outreach was attempted today. The patient was referred to the case management team for assistance with care management and care coordination.   Follow Up Plan: A HIPAA compliant phone message was left for the patient providing contact information and requesting a return call.  The care management team will reach out to the patient again over the next 7-14 days.   Weston Settle Care Guide  [facility] Managed  Care Guide [facility]  [phone]

## 2023-04-02 ENCOUNTER — Encounter: Payer: Self-pay | Admitting: Student

## 2023-04-02 ENCOUNTER — Ambulatory Visit (INDEPENDENT_AMBULATORY_CARE_PROVIDER_SITE_OTHER): Payer: Medicaid Other | Admitting: Student

## 2023-04-02 VITALS — BP 134/89 | HR 81 | Wt 96.2 lb

## 2023-04-02 DIAGNOSIS — E785 Hyperlipidemia, unspecified: Secondary | ICD-10-CM

## 2023-04-02 DIAGNOSIS — J309 Allergic rhinitis, unspecified: Secondary | ICD-10-CM

## 2023-04-02 DIAGNOSIS — Z23 Encounter for immunization: Secondary | ICD-10-CM

## 2023-04-02 DIAGNOSIS — J449 Chronic obstructive pulmonary disease, unspecified: Secondary | ICD-10-CM

## 2023-04-02 DIAGNOSIS — I10 Essential (primary) hypertension: Secondary | ICD-10-CM

## 2023-04-02 DIAGNOSIS — R634 Abnormal weight loss: Secondary | ICD-10-CM | POA: Diagnosis not present

## 2023-04-02 MED ORDER — MIRTAZAPINE 30 MG PO TABS: 30.0000 mg | ORAL_TABLET | Freq: Every day | ORAL | 1 refills | Status: DC

## 2023-04-02 MED ORDER — MIRTAZAPINE 15 MG PO TABS: 15.0000 mg | ORAL_TABLET | Freq: Every day | ORAL | 2 refills | Status: DC

## 2023-04-02 MED ORDER — AMLODIPINE BESYLATE 10 MG PO TABS: 10.0000 mg | ORAL_TABLET | Freq: Every day | ORAL | 3 refills | Status: DC

## 2023-04-02 NOTE — Assessment & Plan Note (Addendum)
Pt was found to have a 27 pound weight loss from May 2023 to January 2024. She believes this is from chronic appetite suppression due to her respiratory issues. She was 92 pounds at her visit in March. At that time, there was concern for possible malignancy given her smoking history. She is scheduled for CT of the chest, abdomen, and pelvis in May as well as a mammogram in several days. Her mirtazapine was increased from 15 mg to 30 mg at last visit. She was having some tremors after starting 30 mg daily so went back down to 15 mg. No tremors since then. She is on Tramadol so there is concern for serotonin syndrome. She had improvement of her appetite and 4 pound weight gain over the past month. She continues to take Ensure x3 daily. Given improvement on 15 mg mirtazapine with concern for recurrent tremors, will maintain current dose.  > mirtazapine 15 mg > Ensure 30 mg > Encourage high calorie diet

## 2023-04-02 NOTE — Progress Notes (Signed)
Subjective:   Patient ID: Janice Williams female   DOB: 1961-03-31 62 y.o.   MRN: [id number]  HPI: [name] is a 62 y.o. female with a PMHx as listed below who presents for follow up. Please see problem based assessment and plan for further work up and management.   Patient Active Problem List   Diagnosis Date Noted   Epidermal inclusion cyst 12/22/2022   Rash of hand 12/21/2022   Former cigarette smoker 11/16/2022   Ankle swelling, left 06/28/2022   Sprain of calcaneofibular ligament 02/28/2022   Need for shingles vaccine 08/14/2021   Constipation 05/12/2021   Cough 03/30/2021   Screening for cervical cancer 03/18/2021   Ear pain, bilateral 03/17/2021   Degenerative disc disease, cervical 10/13/2020   Chronic, continuous use of opioids 09/10/2020   Rhinitis, chronic 06/10/2020   Restless legs syndrome (RLS) 01/08/2020   Osteoporosis 10/10/2019   COPD  GOLD 3  07/08/2019   Moderate protein-calorie malnutrition 05/31/2018   Hypertension 06/08/2017   Left knee pain 03/15/2015   Healthcare maintenance 07/06/2011   Hyperlipidemia 06/03/2009   WEIGHT LOSS 06/03/2009   Human immunodeficiency virus (HIV) disease (HCC) 10/29/2008   GERD 10/29/2008   CEREBROVASCULAR ACCIDENT, HX OF 10/29/2008   TOBACCO DEPENDENCE 02/14/2007   Allergic rhinitis 02/14/2007   Symptoms concerning nutrition, metabolism, and development 02/14/2007     Current Outpatient Medications  Medication Sig Dispense Refill   albuterol (VENTOLIN HFA) 108 (90 Base) MCG/ACT inhaler Inhale 2 puffs into the lungs every 6 (six) hours as needed for wheezing or shortness of breath. 18 g 1   amLODipine (NORVASC) 10 MG tablet Take 1 tablet (10 mg total) by mouth daily. 90 tablet 3   benzonatate (TESSALON) 100 MG capsule Take 1 capsule (100 mg total) by mouth every 8 (eight) hours. (Patient not taking: Reported on 01/15/2023) 21 capsule 0   bictegravir-emtricitabine-tenofovir AF (BIKTARVY) 50-200-25 MG TABS  tablet Take 1 tablet by mouth daily. 30 tablet 4   budesonide-formoterol (SYMBICORT) 80-4.5 MCG/ACT inhaler Inhale 2 puffs into the lungs 2 (two) times daily. 1 each 5   docusate sodium (COLACE) 100 MG capsule Take 1 capsule (100 mg total) by mouth daily as needed. 30 capsule 0   Ensure (ENSURE) Take 237 mLs by mouth 3 (three) times daily between meals. 237 mL 5   EPIPEN 2-PAK 0.3 MG/0.3ML SOAJ injection      fluticasone (FLONASE) 50 MCG/ACT nasal spray Use 1 spray(s) in each nostril once daily 16 g 0   mirtazapine (REMERON) 15 MG tablet Take 1 tablet (15 mg total) by mouth at bedtime. 30 tablet 2   omeprazole (PRILOSEC) 20 MG capsule Take 1 capsule (20 mg total) by mouth daily. 30 capsule 0   ondansetron (ZOFRAN) 4 MG tablet Take 1 tablet (4 mg total) by mouth daily as needed for nausea or vomiting. 30 tablet 1   PRESCRIPTION MEDICATION Inject 1 Dose into the skin once a week. Allergy shot in office on Fridays     RESTASIS 0.05 % ophthalmic emulsion 1 drop 2 (two) times daily.     rosuvastatin (CRESTOR) 20 MG tablet Take 1 tablet (20 mg total) by mouth daily. 30 tablet 2   tiotropium (SPIRIVA HANDIHALER) 18 MCG inhalation capsule Place 1 capsule (18 mcg total) into inhaler and inhale daily. 30 capsule 2   traMADol (ULTRAM) 50 MG tablet Take 1 tablet (50 mg total) by mouth every 6 (six) hours as needed. 60 tablet 0  No current facility-administered medications for this visit.     Review of Systems: Pertinent ROS listed in the A&P. Otherwise negative.   Objective:   Physical Exam: Vitals:   04/02/23 1036 04/02/23 1104  BP: (!) 143/94 134/89  Pulse: 86 81  SpO2: 98%   Weight: 96 lb 3.2 oz (43.6 kg)    Physical Exam Constitutional:      Comments: Cachetic appearing  HENT:     Head: Normocephalic and atraumatic.     Right Ear: Tympanic membrane normal.     Left Ear: Tympanic membrane normal.     Mouth/Throat:     Mouth: Mucous membranes are moist.  Cardiovascular:     Rate and  Rhythm: Normal rate and regular rhythm.     Heart sounds: Normal heart sounds.  Pulmonary:     Effort: Pulmonary effort is normal.     Breath sounds: Normal breath sounds.  Skin:    General: Skin is warm and dry.  Neurological:     General: No focal deficit present.     Mental Status: She is alert and oriented to person, place, and time.  Psychiatric:        Behavior: Behavior normal.      Assessment & Plan:   COPD  GOLD 3  Pt reports improvement in her cough. States she is occasionally short of breath with exertion, but has otherwise felt well. Her activity level has been at baseline. She is currently taking as needed albuterol, daily Symbicort and daily Flonase. It was recommended she start Spiriva at her last visit in March as she was not on a LAMA. Patient reports she did not feel comfortable taking the Spiriva in conjunction with the Symbicort she was already on. Given that she has had improvement in her symptoms and has pulmonology follow up, will continue with current regimen.   > Continue albuterol PRN > Continue Symbicort daily > Continue Flonase daily  > Pulmonology follow up in May  Allergic rhinitis Pt reports history of chronic allergic rhinitis characterized by constant pressure in her bilateral ears. She has been seen at [facility] ENT with recommendations to take levocetirizine, fluticasone, and Symbicort. It was also recommended she use air humidifier, perform nasal irrigations and use mupirocin ointment. States she has been compliant with all of those aside from the mupirocin ointment. It was recommended she start Spiriva at her last visit in March, however patient did not feel comfortable taking this in conjunction with the Symbicort. She has had no improvement with the ear pressure. Encouraged follow up with [facility] otolaryngology.   > continue levocetirizine, Flonase, Symbicort, humidifier and nasal irrigations > follow up with ENT  WEIGHT LOSS Pt was found  to have a 27 pound weight loss from May 2023 to January 2024. She believes this is from chronic appetite suppression due to her respiratory issues. She was 92 pounds at her visit in March. At that time, there was concern for possible malignancy given her smoking history. She is scheduled for CT of the chest, abdomen, and pelvis in May as well as a mammogram in several days. Her mirtazapine was increased from 15 mg to 30 mg at last visit. She was having some tremors after starting 30 mg daily so went back down to 15 mg. No tremors since then. She is on Tramadol so there is concern for serotonin syndrome. She had improvement of her appetite and 4 pound weight gain over the past month. She continues to take Ensure  x3 daily. Given improvement on 15 mg mirtazapine with concern for recurrent tremors, will maintain current dose.  > mirtazapine 15 mg > Ensure 30 mg > Encourage high calorie diet     Hypertension Initial BP 143/94 with repeat 134/89 today. Patient is compliant on her amlodipine and denies any adverse effects including headaches, dizziness, palpitations, or LE swelling. Records reveal she is usually normotensive at her other office visits so blood pressure seems well controlled. Will continue with current regimen.  > continue amlodipine 10 mg  Hyperlipidemia Pt with history of HLD and prior CVA. Lipid panel in January 2024 revealed elevated LDL at 127. She is compliant on her rosuvastatin 20 mg. Given stroke history, goal LDL is <70. Will recheck lipid panel in 3 months.   > rosuvastatin 20 mg > lipid panel at follow up in 3 months

## 2023-04-02 NOTE — Assessment & Plan Note (Signed)
Pt with history of HLD and prior CVA. Lipid panel in January 2024 revealed elevated LDL at 127. She is compliant on her rosuvastatin 20 mg. Given stroke history, goal LDL is <70. Will recheck lipid panel in 3 months.   > rosuvastatin 20 mg > lipid panel at follow up in 3 months

## 2023-04-02 NOTE — Assessment & Plan Note (Signed)
Pt reports history of chronic allergic rhinitis characterized by constant pressure in her bilateral ears. She has been seen at [facility] ENT with recommendations to take levocetirizine, fluticasone, and Symbicort. It was also recommended she use air humidifier, perform nasal irrigations and use mupirocin ointment. States she has been compliant with all of those aside from the mupirocin ointment. It was recommended she start Spiriva at her last visit in March, however patient did not feel comfortable taking this in conjunction with the Symbicort. She has had no improvement with the ear pressure. Encouraged follow up with [facility] otolaryngology.   > continue levocetirizine, Flonase, Symbicort, humidifier and nasal irrigations > follow up with ENT

## 2023-04-02 NOTE — Assessment & Plan Note (Signed)
Pt reports improvement in her cough. States she is occasionally short of breath with exertion, but has otherwise felt well. Her activity level has been at baseline. She is currently taking as needed albuterol, daily Symbicort and daily Flonase. It was recommended she start Spiriva at her last visit in March as she was not on a LAMA. Patient reports she did not feel comfortable taking the Spiriva in conjunction with the Symbicort she was already on. Given that she has had improvement in her symptoms and has pulmonology follow up, will continue with current regimen.   > Continue albuterol PRN > Continue Symbicort daily > Continue Flonase daily  > Pulmonology follow up in May

## 2023-04-02 NOTE — Patient Instructions (Addendum)
Today we discussed the following:  1) COPD Please continue taking your Symbicort,  Flonase, and as needed albuterol. Follow up with pulmonologist in May.  2) Chronic allergic rhinitis Please continue taking your Levocetirizine, Flonase, Symbicort and using your humidifier and nasal irrigation. Follow up with ENT.   3) Weight loss Your weight is up 4 pounds from your last visit in May. Please take 1 mirtazapine 15 mg tablet daily. Continue eating calories dense foods and drinking Ensure three times a day.  4) Hypertension (High blood pressure) Your blood pressure was only slightly elevated today. Please continue taking amlodipine 10 mg daily.   5) Hyperlipidemia (High cholesterol) Please continue taking your rosuvastatin 20 mg daily  Follow up in 3 months

## 2023-04-02 NOTE — Assessment & Plan Note (Signed)
Initial BP 143/94 with repeat 134/89 today. Patient is compliant on her amlodipine and denies any adverse effects including headaches, dizziness, palpitations, or LE swelling. Records reveal she is usually normotensive at her other office visits so blood pressure seems well controlled. Will continue with current regimen.  > continue amlodipine 10 mg

## 2023-04-04 ENCOUNTER — Other Ambulatory Visit: Payer: Self-pay

## 2023-04-05 DIAGNOSIS — Z7689 Persons encountering health services in other specified circumstances: Secondary | ICD-10-CM | POA: Diagnosis not present

## 2023-04-05 NOTE — Progress Notes (Signed)
Internal Medicine Clinic Attending  Case discussed with the resident at the time of the visit.  We reviewed the resident's history and exam and pertinent patient test results.  I agree with the assessment, diagnosis, and plan of care documented in the resident's note.  

## 2023-04-06 ENCOUNTER — Ambulatory Visit: Payer: Medicaid Other

## 2023-04-06 DIAGNOSIS — J3089 Other allergic rhinitis: Secondary | ICD-10-CM | POA: Diagnosis not present

## 2023-04-06 DIAGNOSIS — Z7689 Persons encountering health services in other specified circumstances: Secondary | ICD-10-CM | POA: Diagnosis not present

## 2023-04-06 DIAGNOSIS — J301 Allergic rhinitis due to pollen: Secondary | ICD-10-CM | POA: Diagnosis not present

## 2023-04-10 ENCOUNTER — Telehealth: Payer: Self-pay

## 2023-04-10 NOTE — Telephone Encounter (Signed)
..   Medicaid Managed Care   Unsuccessful Outreach Note  04/10/2023 Name: Janice Williams MRN: [id number] DOB: [birth year]  Referred by: Crissie Sickles, MD Reason for referral : Appointment   A second unsuccessful telephone outreach was attempted today. The patient was referred to the case management team for assistance with care management and care coordination.   Follow Up Plan: A HIPAA compliant phone message was left for the patient providing contact information and requesting a return call.  The care management team will reach out to the patient again over the next 7-14 days.   Weston Settle Care Guide  [facility] Managed  [facility]  [phone]

## 2023-04-11 ENCOUNTER — Other Ambulatory Visit: Payer: Self-pay | Admitting: Student

## 2023-04-11 DIAGNOSIS — F1190 Opioid use, unspecified, uncomplicated: Secondary | ICD-10-CM

## 2023-04-11 NOTE — Telephone Encounter (Signed)
  amLODipine (NORVASC) 10 MG tablet  [id number] -  ([place]), [place] - [address]

## 2023-04-13 DIAGNOSIS — Z7689 Persons encountering health services in other specified circumstances: Secondary | ICD-10-CM | POA: Diagnosis not present

## 2023-04-13 DIAGNOSIS — J301 Allergic rhinitis due to pollen: Secondary | ICD-10-CM | POA: Diagnosis not present

## 2023-04-13 DIAGNOSIS — J3081 Allergic rhinitis due to animal (cat) (dog) hair and dander: Secondary | ICD-10-CM | POA: Diagnosis not present

## 2023-04-13 DIAGNOSIS — J3089 Other allergic rhinitis: Secondary | ICD-10-CM | POA: Diagnosis not present

## 2023-04-16 ENCOUNTER — Ambulatory Visit: Payer: Medicaid Other | Admitting: Internal Medicine

## 2023-04-16 ENCOUNTER — Ambulatory Visit
Admission: RE | Admit: 2023-04-16 | Discharge: 2023-04-16 | Disposition: A | Discharge status: Home / Self Care | Payer: Medicaid Other | Source: Ambulatory Visit | Attending: Internal Medicine | Admitting: Internal Medicine

## 2023-04-16 DIAGNOSIS — Z7689 Persons encountering health services in other specified circumstances: Secondary | ICD-10-CM | POA: Diagnosis not present

## 2023-04-16 DIAGNOSIS — Z0000 Encounter for general adult medical examination without abnormal findings: Secondary | ICD-10-CM

## 2023-04-16 DIAGNOSIS — Z1231 Encounter for screening mammogram for malignant neoplasm of breast: Secondary | ICD-10-CM | POA: Diagnosis not present

## 2023-04-16 NOTE — Progress Notes (Signed)
Janice Williams, female    DOB: 11/10/1961,    MRN: [id number]   Brief patient profile:  34  yobf  HIV 2008 Quit smoking in 2014 cough / sob rx saba then symbicort which helped but still coughing so referred to pulmonary clinic 07/08/2019 by Rexene Alberts   Started allergy shots Aug 2021  Plus zyrtec from Dr [place] Callas  History of Present Illness  07/08/2019  Pulmonary/ 1st office eval/Edgard Debord  Chief Complaint  Patient presents with   Pulmonary Consult    Referred by Rexene Alberts, NP for eval of COPD. Pt c/o cough for the past several months- occ prod with clear sputum. She uses her albuterol inhaler about 2 x per day.   Dyspnea:  MMRC3 = can't walk 100 yards even at a slow pace at a flat grade s stopping due to sob  mb to house and has to stop landing to catch here back slt uphill and 2 steps to top landing  Cough: starts a few hours p supper much worse lying > min clear mucus Sleep: p drinking water settles down able to sleep s noct or am flare  SABA use: helps  pred def helps Hb variable, uses ppi prn  rec Prednisone 10 mg take  4 each am x 2 days,   2 each am x 2 days,  1 each am x 2 days and stop  Plan A = Automatic = symbicort 80 Take 2 puffs first thing in am and then another 2 puffs about 12 hours later.  Work on inhaler technique:    Plan B = Backup Only use your albuterol inhaler as a rescue medication    04/21/21 NP rec Flutter/ breztri trial > no better so resumed symbicort 80   09/14/2021  f/u ov/Coty Larsh re: COPD GOLD 3    maint on symb 80 /allergy shots per sharma  Chief Complaint  Patient presents with   Follow-up   Dyspnea:  vacuum/  MMRC2 = can't walk a nl pace on a flat grade s sob but does fine slow and flat e Cough: none now  Sleeping: bed is flat, 30 degrees  SABA use: one maybe 02: none  Covid status:   vax x 4  Rec No change in medications    11/16/2022  yearly  f/u ov/Kalina Morabito re: GOLD 3 COPD  maint on symb 80 2bid / allergy rx per sharma  Chief Complaint   Patient presents with   Follow-up    Sob with exertion, cough-clear   Dyspnea:  MMRC2 = can't walk a nl pace on a flat grade s sob but does fine slow and flat   Cough: some in am attributed to ongoing severe chronic rhinitis ? Sinsusitis already being addressed by dr [place] Callas  Sleeping: flat bed/ lots of pillows o/w can't breathe SABA use: none  02: none  Rec LDSCT  F/u prn   04/17/2023  f/u ov/Annita Ratliff re: GOLD 3  maint on symbicort 160   Chief Complaint  Patient presents with   Follow-up    Doing well  Dyspnea:  MMRC2 = can't walk a nl pace on a flat grade s sob but does fine slow and flat - improving distances s 02  Cough: none  Sleeping: flat bed/ propped on pillows ok SABA use: none 02: none    Lung cancer screening :  ordered     No obvious day to day or daytime variability or assoc excess/ purulent sputum or mucus plugs or hemoptysis  or cp or chest tightness, subjective wheeze or overt sinus or hb symptoms.   Sleeping  without nocturnal  or early am exacerbation  of respiratory  c/o's or need for noct saba. Also denies any obvious fluctuation of symptoms with weather or environmental changes or other aggravating or alleviating factors except as outlined above   No unusual exposure hx or h/o childhood pna/ asthma or knowledge of premature birth.  Current Allergies, Complete Past Medical History, Past Surgical History, Family History, and Social History were reviewed in [employer] record.  ROS  The following are not active complaints unless bolded Hoarseness, sore throat, dysphagia, dental problems, itching, sneezing,  nasal congestion or discharge of excess mucus or purulent secretions, ear ache,   fever, chills, sweats, unintended wt loss or wt gain, classically pleuritic or exertional cp,  orthopnea pnd or arm/hand swelling  or leg swelling, presyncope, palpitations, abdominal pain, anorexia, nausea, vomiting, diarrhea  or change in bowel habits or  change in bladder habits, change in stools or change in urine, dysuria, hematuria,  rash, arthralgias, visual complaints, headache, numbness, weakness or ataxia or problems with walking or coordination,  change in mood or  memory.        Current Meds  Medication Sig   albuterol (VENTOLIN HFA) 108 (90 Base) MCG/ACT inhaler Inhale 2 puffs into the lungs every 6 (six) hours as needed for wheezing or shortness of breath.   amLODipine (NORVASC) 10 MG tablet Take 1 tablet (10 mg total) by mouth daily.   bictegravir-emtricitabine-tenofovir AF (BIKTARVY) 50-200-25 MG TABS tablet Take 1 tablet by mouth daily.   budesonide-formoterol (SYMBICORT) 80-4.5 MCG/ACT inhaler Inhale 2 puffs into the lungs 2 (two) times daily.   docusate sodium (COLACE) 100 MG capsule Take 1 capsule (100 mg total) by mouth daily as needed.   Ensure (ENSURE) Take 237 mLs by mouth 3 (three) times daily between meals.   EPIPEN 2-PAK 0.3 MG/0.3ML SOAJ injection    fluticasone (FLONASE) 50 MCG/ACT nasal spray Use 1 spray(s) in each nostril once daily   mirtazapine (REMERON) 15 MG tablet Take 1 tablet (15 mg total) by mouth at bedtime.   omeprazole (PRILOSEC) 20 MG capsule Take 1 capsule (20 mg total) by mouth daily.   ondansetron (ZOFRAN) 4 MG tablet Take 1 tablet (4 mg total) by mouth daily as needed for nausea or vomiting.   PRESCRIPTION MEDICATION Inject 1 Dose into the skin once a week. Allergy shot in office on Fridays   RESTASIS 0.05 % ophthalmic emulsion 1 drop 2 (two) times daily.   rosuvastatin (CRESTOR) 20 MG tablet Take 1 tablet (20 mg total) by mouth daily.   tiotropium (SPIRIVA HANDIHALER) 18 MCG inhalation capsule Place 1 capsule (18 mcg total) into inhaler and inhale daily.   traMADol (ULTRAM) 50 MG tablet Take 1 tablet (50 mg total) by mouth every 6 (six) hours as needed.             Past Medical History:  Diagnosis Date   Allergy    Anemia    Arthritis      lower back, knees   Bronchitis    COPD (chronic  obstructive pulmonary disease) (HCC)    Former smoker    quit 2014   GERD (gastroesophageal reflux disease)    HIV infection (HCC)    Hypertension    Stroke (HCC)    Substance abuse (HCC)    history, clean 7 years   SVD (spontaneous vaginal delivery)    x  3     Objective:   Wts  04/17/2023        94  11/16/2022      104  09/14/2021       109 09/14/2020        98 06/09/2020        97  08/11/19 100 lb 3.2 oz (45.5 kg)  07/08/19 102 lb (46.3 kg)  12/02/18 99 lb (44.9 kg)   Vital signs reviewed  04/17/2023  - Note at rest 02 sats  95% on RA   General appearance:    pleasant amb bf nad  HEENT :  Oropharynx  clear   Nasal turbinates nl   NECK :  without JVD/Nodes/TM/ nl carotid upstrokes bilaterally   LUNGS: no acc muscle use,  Mod barrel  contour chest wall with bilateral  Distant bs s audible wheeze and  without cough on insp or exp maneuvers and mod  Hyperresonant  to  percussion bilaterally     CV:  RRR  no s3 or murmur or increase in P2, and no edema   ABD:  soft and nontender   MS:   Ext warm without deformities or   obvious joint restrictions , calf tenderness, cyanosis or clubbing  SKIN: warm and dry without lesions    NEURO:  alert, approp, nl sensorium with  no motor or cerebellar deficits apparent.            Assessment

## 2023-04-17 ENCOUNTER — Ambulatory Visit (INDEPENDENT_AMBULATORY_CARE_PROVIDER_SITE_OTHER): Payer: Medicaid Other | Admitting: Internal Medicine

## 2023-04-17 ENCOUNTER — Encounter: Payer: Self-pay | Admitting: Internal Medicine

## 2023-04-17 VITALS — BP 114/64 | HR 90 | Temp 98.3°F | Ht 64.0 in | Wt 94.4 lb

## 2023-04-17 DIAGNOSIS — Z87891 Personal history of nicotine dependence: Secondary | ICD-10-CM

## 2023-04-17 DIAGNOSIS — J449 Chronic obstructive pulmonary disease, unspecified: Secondary | ICD-10-CM | POA: Diagnosis not present

## 2023-04-17 DIAGNOSIS — Z7689 Persons encountering health services in other specified circumstances: Secondary | ICD-10-CM | POA: Diagnosis not present

## 2023-04-17 NOTE — Patient Instructions (Signed)
Let your ID doctors know about your new high dose symbicort use   My office will be contacting you by phone for referral to lung cancer screening program  - if you don't hear back from my office within one week please call us back or notify us thru MyChart and we'll address it right away.    If you are satisfied with your treatment plan,  let your doctor know and he/she can either refill your medications or you can return here when your prescription runs out.     If in any way you are not 100% satisfied,  please tell us.  If 100% better, tell your friends!  Pulmonary follow up is as needed

## 2023-04-17 NOTE — Assessment & Plan Note (Signed)
Quit smoking 2014 > referred for LDSCT 04/17/2023 >>>   Low-dose CT lung cancer screening is recommended for patients who are 40-63 years of age with a 20+ pack-year history of smoking and who are currently smoking or quit <=15 years ago. No coughing up blood  No unintentional weight loss of > 15 pounds in the last 6 months (says she cycles up and down by 10 lbs yearly - pt is eligible for scanning yearly until 2029  >> referred   Discussed in detail all the  indications, usual  risks and alternatives  relative to the benefits with patient who agrees to proceed with w/u as outlined.     Each maintenance medication was reviewed in detail including emphasizing most importantly the difference between maintenance and prns and under what circumstances the prns are to be triggered using an action plan format where appropriate.  Total time for H and P, chart review, counseling, reviewing hfa/smi device(s) and generating customized AVS unique to this office visit / same day charting = 21 min

## 2023-04-18 ENCOUNTER — Encounter: Payer: Self-pay | Admitting: Internal Medicine

## 2023-04-18 ENCOUNTER — Ambulatory Visit (HOSPITAL_COMMUNITY): Payer: Medicaid Other

## 2023-04-18 DIAGNOSIS — Z419 Encounter for procedure for purposes other than remedying health state, unspecified: Secondary | ICD-10-CM | POA: Diagnosis not present

## 2023-04-18 NOTE — Assessment & Plan Note (Signed)
Quit smoking 2014  -08/11/2019  After extensive coaching inhaler device,  effectiveness =    50% from baseline 25% (short Ti) > continue symb 80 2bid  - 06/09/2020  After extensive coaching inhaler device,  effectiveness =    75% > try breztri 2bid if insurance covers> no change on breztri so stayed with symbicort 80  - flutter valve added 04/21/21 - PFT's  09/14/2021  FEV1 0.74  (32 % ) ratio 0.41  p 0 % improvement from saba p symb 80 prior to study with DLCO  11.08 (50%) corrects to 3.44 (82%)  for alv volume and FV curve classic concavity    Group D (now reclassified as E) in terms of symptom/risk and laba/lama/ICS  therefore appropriate rx at this point >>>  symbicort/spiriva bes option given insurance restrictions.

## 2023-04-20 ENCOUNTER — Other Ambulatory Visit (HOSPITAL_COMMUNITY): Payer: Self-pay

## 2023-04-20 DIAGNOSIS — Z7689 Persons encountering health services in other specified circumstances: Secondary | ICD-10-CM | POA: Diagnosis not present

## 2023-04-20 DIAGNOSIS — J3081 Allergic rhinitis due to animal (cat) (dog) hair and dander: Secondary | ICD-10-CM | POA: Diagnosis not present

## 2023-04-20 DIAGNOSIS — J301 Allergic rhinitis due to pollen: Secondary | ICD-10-CM | POA: Diagnosis not present

## 2023-04-20 DIAGNOSIS — J3089 Other allergic rhinitis: Secondary | ICD-10-CM | POA: Diagnosis not present

## 2023-04-20 MED ORDER — TRAMADOL HCL 50 MG PO TABS
50.0000 mg | ORAL_TABLET | Freq: Four times a day (QID) | ORAL | 0 refills | Status: DC | PRN
Start: 2023-04-20 — End: 2023-05-18
  Filled 2023-04-20: qty 60, 15d supply, fill #0

## 2023-04-20 NOTE — Telephone Encounter (Signed)
MED REFILL REQUEST  traMADol (ULTRAM) 50 MG table     [id number] [place], [place] - [address] Phone: [phone]  Fax: [phone]

## 2023-04-21 ENCOUNTER — Other Ambulatory Visit (HOSPITAL_COMMUNITY): Payer: Self-pay

## 2023-04-23 ENCOUNTER — Telehealth: Payer: Self-pay | Admitting: Internal Medicine

## 2023-04-23 ENCOUNTER — Ambulatory Visit: Payer: Medicaid Other | Admitting: Internal Medicine

## 2023-04-23 MED ORDER — AZITHROMYCIN 250 MG PO TABS: ORAL_TABLET | ORAL | 0 refills | Status: DC

## 2023-04-23 NOTE — Telephone Encounter (Signed)
Called and spoke with pt letting her know recs per MW and she verbalized understanding. Nothing further needed. 

## 2023-04-23 NOTE — Telephone Encounter (Signed)
Patient states having symptoms of cough and mucus. Pharmacy is [facility] [address]. Patient phone number is [phone].

## 2023-04-23 NOTE — Telephone Encounter (Signed)
She is seeing ID in 2 days so should keep that appt  In meantime has tramadol on her med list and here are my instructions on use::  Take delsym two tsp every 12 hours and supplement if needed with  tramadol 50 mg up to 1 every 4 hours to suppress the urge to cough. Swallowing water and/or using ice chips/non mint and menthol containing candies (such as lifesavers or sugarless jolly ranchers) are also effective.  You should rest your voice and avoid activities that you know make you cough.  Once you have eliminated the cough for 3 straight days try reducing the tramadol first,  then the delsym as tolerated.    Zpak   May need to d/c spiriva if still has the powder as it tends to make the cough worse

## 2023-04-23 NOTE — Telephone Encounter (Signed)
Spoke with patient. She complains of productive cough with green phlegm, chest/nasal congestion. Symptoms started yesterday morning. Patient states she has been coughing all night. No fever. Patient has been using cough drops  Dr. Sherene Sires can you please advise  [facility] [place]

## 2023-04-25 ENCOUNTER — Other Ambulatory Visit (HOSPITAL_COMMUNITY): Payer: Self-pay

## 2023-04-25 ENCOUNTER — Other Ambulatory Visit: Payer: Self-pay

## 2023-04-25 ENCOUNTER — Encounter: Payer: Self-pay | Admitting: Internal Medicine

## 2023-04-25 ENCOUNTER — Ambulatory Visit (INDEPENDENT_AMBULATORY_CARE_PROVIDER_SITE_OTHER): Payer: Medicaid Other | Admitting: Internal Medicine

## 2023-04-25 VITALS — BP 119/85 | HR 94 | Resp 16 | Ht 64.0 in | Wt 92.4 lb

## 2023-04-25 DIAGNOSIS — Z7689 Persons encountering health services in other specified circumstances: Secondary | ICD-10-CM | POA: Diagnosis not present

## 2023-04-25 DIAGNOSIS — B20 Human immunodeficiency virus [HIV] disease: Secondary | ICD-10-CM | POA: Diagnosis not present

## 2023-04-25 DIAGNOSIS — Z79899 Other long term (current) drug therapy: Secondary | ICD-10-CM | POA: Diagnosis not present

## 2023-04-25 DIAGNOSIS — R634 Abnormal weight loss: Secondary | ICD-10-CM

## 2023-04-25 DIAGNOSIS — Z129 Encounter for screening for malignant neoplasm, site unspecified: Secondary | ICD-10-CM

## 2023-04-25 MED ORDER — MEGESTROL ACETATE 40 MG PO TABS
40.0000 mg | ORAL_TABLET | Freq: Every day | ORAL | 3 refills | Status: DC
  Filled 2023-04-25 – 2023-04-27 (×2): qty 30, 30d supply, fill #0
  Filled 2023-05-18 – 2023-05-24 (×2): qty 30, 30d supply, fill #1
  Filled 2023-06-20: qty 30, 30d supply, fill #2
  Filled 2023-07-20: qty 30, 30d supply, fill #3

## 2023-04-25 NOTE — Progress Notes (Signed)
RFV: follow up for hiv disease  Patient ID: Janice Williams, female   DOB: 06-05-1961, 62 y.o.   MRN: [id number]  HPI  Concern about weight loss. Mirtazipine not working. She takes 3 cans of ensure supplementation. Often feels defeated, tearful during exam. 5 lb less than her previous visit in January and last summer at 117.   Outpatient Encounter Medications as of 04/25/2023  Medication Sig   albuterol (VENTOLIN HFA) 108 (90 Base) MCG/ACT inhaler Inhale 2 puffs into the lungs every 6 (six) hours as needed for wheezing or shortness of breath.   amLODipine (NORVASC) 10 MG tablet Take 1 tablet (10 mg total) by mouth daily.   azithromycin (ZITHROMAX) 250 MG tablet Take two today and then one daily until finished.   bictegravir-emtricitabine-tenofovir AF (BIKTARVY) 50-200-25 MG TABS tablet Take 1 tablet by mouth daily.   budesonide-formoterol (SYMBICORT) 80-4.5 MCG/ACT inhaler Inhale 2 puffs into the lungs 2 (two) times daily.   docusate sodium (COLACE) 100 MG capsule Take 1 capsule (100 mg total) by mouth daily as needed.   Ensure (ENSURE) Take 237 mLs by mouth 3 (three) times daily between meals.   EPIPEN 2-PAK 0.3 MG/0.3ML SOAJ injection    fluticasone (FLONASE) 50 MCG/ACT nasal spray Use 1 spray(s) in each nostril once daily   mirtazapine (REMERON) 15 MG tablet Take 1 tablet (15 mg total) by mouth at bedtime.   omeprazole (PRILOSEC) 20 MG capsule Take 1 capsule (20 mg total) by mouth daily.   PRESCRIPTION MEDICATION Inject 1 Dose into the skin once a week. Allergy shot in office on Fridays   RESTASIS 0.05 % ophthalmic emulsion 1 drop 2 (two) times daily.   rosuvastatin (CRESTOR) 20 MG tablet Take 1 tablet (20 mg total) by mouth daily.   traMADol (ULTRAM) 50 MG tablet Take 1 tablet (50 mg total) by mouth every 6 (six) hours as needed.   tiotropium (SPIRIVA HANDIHALER) 18 MCG inhalation capsule Place 1 capsule (18 mcg total) into inhaler and inhale daily. (Patient not taking: Reported on  04/25/2023)   [DISCONTINUED] ondansetron (ZOFRAN) 4 MG tablet Take 1 tablet (4 mg total) by mouth daily as needed for nausea or vomiting. (Patient not taking: Reported on 04/25/2023)   No facility-administered encounter medications on file as of 04/25/2023.     Patient Active Problem List   Diagnosis Date Noted   Epidermal inclusion cyst 12/22/2022   Rash of hand 12/21/2022   Former cigarette smoker 11/16/2022   Ankle swelling, left 06/28/2022   Sprain of calcaneofibular ligament 02/28/2022   Need for shingles vaccine 08/14/2021   Constipation 05/12/2021   Cough 03/30/2021   Screening for cervical cancer 03/18/2021   Ear pain, bilateral 03/17/2021   Degenerative disc disease, cervical 10/13/2020   Chronic, continuous use of opioids 09/10/2020   Rhinitis, chronic 06/10/2020   Restless legs syndrome (RLS) 01/08/2020   Osteoporosis 10/10/2019   COPD  GOLD 3  07/08/2019   Moderate protein-calorie malnutrition (HCC) 05/31/2018   Hypertension 06/08/2017   Left knee pain 03/15/2015   Healthcare maintenance 07/06/2011   Hyperlipidemia 06/03/2009   WEIGHT LOSS 06/03/2009   Human immunodeficiency virus (HIV) disease (HCC) 10/29/2008   GERD 10/29/2008   CEREBROVASCULAR ACCIDENT, HX OF 10/29/2008   TOBACCO DEPENDENCE 02/14/2007   Allergic rhinitis 02/14/2007   Symptoms concerning nutrition, metabolism, and development 02/14/2007     Health Maintenance Due  Topic Date Due   Lung Cancer Screening  Never done     Review of Systems 12  point ros is negative except what is mentioned above Physical Exam   BP 119/85   Pulse 94   Resp 16   Ht 5\' 4"  (1.626 m)   Wt 92 lb 6.4 oz (41.9 kg)   SpO2 97%   BMI 15.86 kg/m   Physical Exam  Constitutional:  oriented to person, place, and time. appears well-developed and well-nourished. No distress.  HENT: [place]/AT, PERRLA, no scleral icterus Mouth/Throat: Oropharynx is clear and moist. No oropharyngeal exudate.  Cardiovascular: Normal rate,  regular rhythm and normal heart sounds. Exam reveals no gallop and no friction rub.  No murmur heard.  Pulmonary/Chest: Effort normal and breath sounds normal. No respiratory distress.  has no wheezes.  Neck = supple, no nuchal rigidity Abdominal: Soft. Bowel sounds are normal.  exhibits no distension. There is no tenderness.  Lymphadenopathy: no cervical adenopathy. No axillary adenopathy Neurological: alert and oriented to person, place, and time.  Skin: Skin is warm and dry. No rash noted. No erythema.  Psychiatric: a normal mood and affect.  behavior is normal.   Lab Results  Component Value Date   CD4TCELL 43 01/15/2023   Lab Results  Component Value Date   CD4TABS 674 01/15/2023   CD4TABS 899 08/01/2021   CD4TABS 682 03/07/2021   Lab Results  Component Value Date   HIV1RNAQUANT <20 (H) 01/15/2023   Lab Results  Component Value Date   HEPBSAB NEG 12/28/2014   Lab Results  Component Value Date   LABRPR NON-REACTIVE 01/15/2023    CBC Lab Results  Component Value Date   WBC 4.1 01/15/2023   RBC 3.86 01/15/2023   HGB 12.4 01/15/2023   HCT 36.6 01/15/2023   PLT 277 01/15/2023   MCV 94.8 01/15/2023   MCH 32.1 01/15/2023   MCHC 33.9 01/15/2023   RDW 13.1 01/15/2023   LYMPHSABS 1,710 01/15/2023   MONOABS 0.6 06/28/2022   EOSABS 29 01/15/2023    BMET Lab Results  Component Value Date   NA 140 01/15/2023   K 4.1 01/15/2023   CL 102 01/15/2023   CO2 28 01/15/2023   GLUCOSE 112 (H) 01/15/2023   BUN 14 01/15/2023   CREATININE 0.93 01/15/2023   CALCIUM 9.5 01/15/2023   GFRNONAA 52 (L) 06/28/2022   GFRAA 65 03/07/2021      Assessment and Plan Anorexia = Megestrol for anorexia. Continue on mirtazipine as well Will check thyroid panel Come back in 1 month to evaluate  Hiv disease = continue on ART, will check labs to see still undetectable  Long term medication management= cr remains stable on last labs, will repeat cr  Health maintenance = hx of  smoking - Smoking cancer screening chest ct

## 2023-04-26 ENCOUNTER — Other Ambulatory Visit: Payer: Self-pay

## 2023-04-26 ENCOUNTER — Other Ambulatory Visit (HOSPITAL_COMMUNITY): Payer: Self-pay

## 2023-04-26 ENCOUNTER — Encounter: Payer: Self-pay | Admitting: Pharmacist

## 2023-04-26 LAB — THYROID PANEL WITH TSH
Free Thyroxine Index: 2.7 (ref 1.4–3.8)
T3 Uptake: 27 % (ref 22–35)
T4, Total: 10.1 ug/dL (ref 5.1–11.9)
TSH: 2.24 mIU/L (ref 0.40–4.50)

## 2023-04-27 ENCOUNTER — Other Ambulatory Visit (HOSPITAL_COMMUNITY): Payer: Self-pay

## 2023-04-27 ENCOUNTER — Other Ambulatory Visit: Payer: Self-pay

## 2023-04-27 DIAGNOSIS — Z7689 Persons encountering health services in other specified circumstances: Secondary | ICD-10-CM | POA: Diagnosis not present

## 2023-05-02 ENCOUNTER — Other Ambulatory Visit (HOSPITAL_COMMUNITY): Payer: Self-pay

## 2023-05-04 DIAGNOSIS — J3081 Allergic rhinitis due to animal (cat) (dog) hair and dander: Secondary | ICD-10-CM | POA: Diagnosis not present

## 2023-05-04 DIAGNOSIS — J3089 Other allergic rhinitis: Secondary | ICD-10-CM | POA: Diagnosis not present

## 2023-05-04 DIAGNOSIS — J301 Allergic rhinitis due to pollen: Secondary | ICD-10-CM | POA: Diagnosis not present

## 2023-05-04 DIAGNOSIS — Z7689 Persons encountering health services in other specified circumstances: Secondary | ICD-10-CM | POA: Diagnosis not present

## 2023-05-08 ENCOUNTER — Ambulatory Visit (HOSPITAL_COMMUNITY): Payer: Medicaid Other

## 2023-05-11 DIAGNOSIS — J3089 Other allergic rhinitis: Secondary | ICD-10-CM | POA: Diagnosis not present

## 2023-05-11 DIAGNOSIS — J3081 Allergic rhinitis due to animal (cat) (dog) hair and dander: Secondary | ICD-10-CM | POA: Diagnosis not present

## 2023-05-11 DIAGNOSIS — J301 Allergic rhinitis due to pollen: Secondary | ICD-10-CM | POA: Diagnosis not present

## 2023-05-11 DIAGNOSIS — Z7689 Persons encountering health services in other specified circumstances: Secondary | ICD-10-CM | POA: Diagnosis not present

## 2023-05-12 DIAGNOSIS — J301 Allergic rhinitis due to pollen: Secondary | ICD-10-CM | POA: Diagnosis not present

## 2023-05-12 DIAGNOSIS — I10 Essential (primary) hypertension: Secondary | ICD-10-CM | POA: Diagnosis not present

## 2023-05-12 DIAGNOSIS — R636 Underweight: Secondary | ICD-10-CM | POA: Diagnosis not present

## 2023-05-12 DIAGNOSIS — K219 Gastro-esophageal reflux disease without esophagitis: Secondary | ICD-10-CM | POA: Diagnosis not present

## 2023-05-12 DIAGNOSIS — E785 Hyperlipidemia, unspecified: Secondary | ICD-10-CM | POA: Diagnosis not present

## 2023-05-12 DIAGNOSIS — R64 Cachexia: Secondary | ICD-10-CM | POA: Diagnosis not present

## 2023-05-12 DIAGNOSIS — J449 Chronic obstructive pulmonary disease, unspecified: Secondary | ICD-10-CM | POA: Diagnosis not present

## 2023-05-12 DIAGNOSIS — G4700 Insomnia, unspecified: Secondary | ICD-10-CM | POA: Diagnosis not present

## 2023-05-12 DIAGNOSIS — Z681 Body mass index (BMI) 19 or less, adult: Secondary | ICD-10-CM | POA: Diagnosis not present

## 2023-05-12 DIAGNOSIS — M1990 Unspecified osteoarthritis, unspecified site: Secondary | ICD-10-CM | POA: Diagnosis not present

## 2023-05-12 DIAGNOSIS — Z79818 Long term (current) use of other agents affecting estrogen receptors and estrogen levels: Secondary | ICD-10-CM | POA: Diagnosis not present

## 2023-05-17 ENCOUNTER — Ambulatory Visit (INDEPENDENT_AMBULATORY_CARE_PROVIDER_SITE_OTHER): Payer: Medicaid Other | Admitting: Internal Medicine

## 2023-05-17 ENCOUNTER — Encounter: Payer: Self-pay | Admitting: Internal Medicine

## 2023-05-17 ENCOUNTER — Other Ambulatory Visit: Payer: Self-pay

## 2023-05-17 VITALS — BP 125/81 | HR 87 | Temp 97.9°F | Ht 63.0 in | Wt 94.0 lb

## 2023-05-17 DIAGNOSIS — L0201 Cutaneous abscess of face: Secondary | ICD-10-CM

## 2023-05-17 DIAGNOSIS — R634 Abnormal weight loss: Secondary | ICD-10-CM

## 2023-05-17 DIAGNOSIS — B0089 Other herpesviral infection: Secondary | ICD-10-CM | POA: Diagnosis not present

## 2023-05-17 DIAGNOSIS — B20 Human immunodeficiency virus [HIV] disease: Secondary | ICD-10-CM | POA: Diagnosis not present

## 2023-05-17 DIAGNOSIS — Z7689 Persons encountering health services in other specified circumstances: Secondary | ICD-10-CM | POA: Diagnosis not present

## 2023-05-17 MED ORDER — VALACYCLOVIR HCL 1 G PO TABS
1000.0000 mg | ORAL_TABLET | Freq: Two times a day (BID) | ORAL | 0 refills | Status: DC
Start: 2023-05-17 — End: 2023-08-08

## 2023-05-17 MED ORDER — CEPHALEXIN 500 MG PO CAPS: 500.0000 mg | ORAL_CAPSULE | Freq: Four times a day (QID) | ORAL | 0 refills | Status: DC

## 2023-05-17 NOTE — Progress Notes (Signed)
RFV: follow up for hiv disease  Patient ID: Janice Williams, female   DOB: [birth year], 62 y.o.   MRN: [id number]  HPI 62yo F with well controlled hiv disease but severe protein calorie manlutrition on mirtazipine and megace  Has increased 2 lb since visit 1 month ago.  She has tenderness by right ear. Swelling ? Pustule to side of her face.   Also new rash to finger tips.  Recently found her neighbor that she had a stroke, where they had to break into neighbor's home. She feels like she had some PTSD  since she also found her mom that way. Spoke with counselor as well about her symptoms.  Outpatient Encounter Medications as of 05/17/2023  Medication Sig   albuterol (VENTOLIN HFA) 108 (90 Base) MCG/ACT inhaler Inhale 2 puffs into the lungs every 6 (six) hours as needed for wheezing or shortness of breath.   amLODipine (NORVASC) 10 MG tablet Take 1 tablet (10 mg total) by mouth daily.   bictegravir-emtricitabine-tenofovir AF (BIKTARVY) 50-200-25 MG TABS tablet Take 1 tablet by mouth daily.   budesonide-formoterol (SYMBICORT) 80-4.5 MCG/ACT inhaler Inhale 2 puffs into the lungs 2 (two) times daily.   docusate sodium (COLACE) 100 MG capsule Take 1 capsule (100 mg total) by mouth daily as needed.   Ensure (ENSURE) Take 237 mLs by mouth 3 (three) times daily between meals.   EPIPEN 2-PAK 0.3 MG/0.3ML SOAJ injection    fluticasone (FLONASE) 50 MCG/ACT nasal spray Use 1 spray(s) in each nostril once daily   megestrol (MEGACE) 40 MG tablet Take 1 tablet (40 mg total) by mouth daily.   mirtazapine (REMERON) 15 MG tablet Take 1 tablet (15 mg total) by mouth at bedtime.   omeprazole (PRILOSEC) 20 MG capsule Take 1 capsule (20 mg total) by mouth daily.   PRESCRIPTION MEDICATION Inject 1 Dose into the skin once a week. Allergy shot in office on Fridays   RESTASIS 0.05 % ophthalmic emulsion 1 drop 2 (two) times daily.   rosuvastatin (CRESTOR) 20 MG tablet Take 1 tablet (20 mg total) by mouth daily.    tiotropium (SPIRIVA HANDIHALER) 18 MCG inhalation capsule Place 1 capsule (18 mcg total) into inhaler and inhale daily.   traMADol (ULTRAM) 50 MG tablet Take 1 tablet (50 mg total) by mouth every 6 (six) hours as needed.   azithromycin (ZITHROMAX) 250 MG tablet Take two today and then one daily until finished. (Patient not taking: Reported on 05/17/2023)   No facility-administered encounter medications on file as of 05/17/2023.     Patient Active Problem List   Diagnosis Date Noted   Epidermal inclusion cyst 12/22/2022   Rash of hand 12/21/2022   Former cigarette smoker 11/16/2022   Ankle swelling, left 06/28/2022   Sprain of calcaneofibular ligament 02/28/2022   Need for shingles vaccine 08/14/2021   Constipation 05/12/2021   Cough 03/30/2021   Screening for cervical cancer 03/18/2021   Ear pain, bilateral 03/17/2021   Degenerative disc disease, cervical 10/13/2020   Chronic, continuous use of opioids 09/10/2020   Rhinitis, chronic 06/10/2020   Restless legs syndrome (RLS) 01/08/2020   Osteoporosis 10/10/2019   COPD  GOLD 3  07/08/2019   Moderate protein-calorie malnutrition (HCC) 05/31/2018   Hypertension 06/08/2017   Left knee pain 03/15/2015   Healthcare maintenance 07/06/2011   Hyperlipidemia 06/03/2009   WEIGHT LOSS 06/03/2009   Human immunodeficiency virus (HIV) disease (HCC) 10/29/2008   GERD 10/29/2008   CEREBROVASCULAR ACCIDENT, HX OF 10/29/2008   TOBACCO DEPENDENCE  02/14/2007   Allergic rhinitis 02/14/2007   Symptoms concerning nutrition, metabolism, and development 02/14/2007     Health Maintenance Due  Topic Date Due   Lung Cancer Screening  Never done   COVID-19 Vaccine (4 - 2023-24 season) 08/18/2022     Review of Systems Review of Systems  Constitutional: Negative for fever, chills, diaphoresis, activity change, appetite change, fatigue and unexpected weight change.  HENT: Negative for congestion, sore throat, rhinorrhea, sneezing, trouble swallowing  and sinus pressure.  Eyes: Negative for photophobia and visual disturbance.  Respiratory: Negative for cough, chest tightness, shortness of breath, wheezing and stridor.  Cardiovascular: Negative for chest pain, palpitations and leg swelling.  Gastrointestinal: Negative for nausea, vomiting, abdominal pain, diarrhea, constipation, blood in stool, abdominal distention and anal bleeding.  Genitourinary: Negative for dysuria, hematuria, flank pain and difficulty urinating.  Musculoskeletal: Negative for myalgias, back pain, joint swelling, arthralgias and gait problem.  Skin: Negative for color change, pallor, rash and wound.  Neurological: Negative for dizziness, tremors, weakness and light-headedness.  Hematological: Negative for adenopathy. Does not bruise/bleed easily.  Psychiatric/Behavioral: Negative for behavioral problems, confusion, sleep disturbance, dysphoric mood, decreased concentration and agitation.   Physical Exam   BP 125/81   Pulse 87   Temp 97.9 F (36.6 C) (Oral)   Ht 5\' 3"  (1.6 m)   Wt 94 lb (42.6 kg)   SpO2 100%   BMI 16.65 kg/m   Physical Exam  Constitutional:  oriented to person, place, and time. appears well-developed and well-nourished. No distress.  HENT: [place]/AT, PERRLA, no scleral icterus Mouth/Throat: Oropharynx is clear and moist. No oropharyngeal exudate.  Cardiovascular: Normal rate, regular rhythm and normal heart sounds. Exam reveals no gallop and no friction rub.  No murmur heard.  Pulmonary/Chest: Effort normal and breath sounds normal. No respiratory distress.  has no wheezes.  Neck = supple, no nuchal rigidity Abdominal: Soft. Bowel sounds are normal.  exhibits no distension. There is no tenderness.  Lymphadenopathy: no cervical adenopathy. No axillary adenopathy Neurological: alert and oriented to person, place, and time.  Skin: Skin is warm and dry. No rash noted. No erythema.  Psychiatric: a normal mood and affect.  behavior is normal.   Lab  Results  Component Value Date   CD4TCELL 43 01/15/2023   Lab Results  Component Value Date   CD4TABS 674 01/15/2023   CD4TABS 899 08/01/2021   CD4TABS 682 03/07/2021   Lab Results  Component Value Date   HIV1RNAQUANT <20 (H) 01/15/2023   Lab Results  Component Value Date   HEPBSAB NEG 12/28/2014   Lab Results  Component Value Date   LABRPR NON-REACTIVE 01/15/2023    CBC Lab Results  Component Value Date   WBC 4.1 01/15/2023   RBC 3.86 01/15/2023   HGB 12.4 01/15/2023   HCT 36.6 01/15/2023   PLT 277 01/15/2023   MCV 94.8 01/15/2023   MCH 32.1 01/15/2023   MCHC 33.9 01/15/2023   RDW 13.1 01/15/2023   LYMPHSABS 1,710 01/15/2023   MONOABS 0.6 06/28/2022   EOSABS 29 01/15/2023    BMET Lab Results  Component Value Date   NA 140 01/15/2023   K 4.1 01/15/2023   CL 102 01/15/2023   CO2 28 01/15/2023   GLUCOSE 112 (H) 01/15/2023   BUN 14 01/15/2023   CREATININE 0.93 01/15/2023   CALCIUM 9.5 01/15/2023   GFRNONAA 52 (L) 06/28/2022   GFRAA 65 03/07/2021      Assessment and Plan  Cephalexin 500mg  qid x 7 -cellulitis/facial  abscess  Hiv disease = well- controlled; will check labs  Herpetic whitlow = valtrex 1000mg  bid x 7 days  Unintentional weight loss = continue with megace

## 2023-05-18 ENCOUNTER — Other Ambulatory Visit (HOSPITAL_COMMUNITY): Payer: Self-pay

## 2023-05-18 ENCOUNTER — Other Ambulatory Visit: Payer: Self-pay

## 2023-05-18 DIAGNOSIS — J301 Allergic rhinitis due to pollen: Secondary | ICD-10-CM | POA: Diagnosis not present

## 2023-05-18 DIAGNOSIS — J3089 Other allergic rhinitis: Secondary | ICD-10-CM | POA: Diagnosis not present

## 2023-05-18 DIAGNOSIS — F1190 Opioid use, unspecified, uncomplicated: Secondary | ICD-10-CM

## 2023-05-18 DIAGNOSIS — Z7689 Persons encountering health services in other specified circumstances: Secondary | ICD-10-CM | POA: Diagnosis not present

## 2023-05-19 DIAGNOSIS — Z419 Encounter for procedure for purposes other than remedying health state, unspecified: Secondary | ICD-10-CM | POA: Diagnosis not present

## 2023-05-19 MED ORDER — TRAMADOL HCL 50 MG PO TABS
50.0000 mg | ORAL_TABLET | Freq: Four times a day (QID) | ORAL | 0 refills | Status: DC | PRN
Start: 2023-05-19 — End: 2023-06-18
  Filled 2023-05-19 – 2023-05-21 (×2): qty 60, 15d supply, fill #0

## 2023-05-21 ENCOUNTER — Other Ambulatory Visit: Payer: Self-pay

## 2023-05-21 ENCOUNTER — Other Ambulatory Visit (HOSPITAL_COMMUNITY): Payer: Self-pay

## 2023-05-22 ENCOUNTER — Encounter: Payer: Self-pay | Admitting: *Deleted

## 2023-05-22 ENCOUNTER — Other Ambulatory Visit (HOSPITAL_COMMUNITY): Payer: Self-pay

## 2023-05-24 ENCOUNTER — Ambulatory Visit (INDEPENDENT_AMBULATORY_CARE_PROVIDER_SITE_OTHER): Payer: Medicaid Other | Admitting: Student

## 2023-05-24 ENCOUNTER — Telehealth: Payer: Self-pay

## 2023-05-24 ENCOUNTER — Other Ambulatory Visit: Payer: Self-pay

## 2023-05-24 ENCOUNTER — Other Ambulatory Visit (HOSPITAL_COMMUNITY): Payer: Self-pay

## 2023-05-24 ENCOUNTER — Encounter: Payer: Self-pay | Admitting: Student

## 2023-05-24 VITALS — BP 128/82 | HR 92 | Temp 98.4°F | Wt 94.6 lb

## 2023-05-24 DIAGNOSIS — J069 Acute upper respiratory infection, unspecified: Secondary | ICD-10-CM | POA: Diagnosis not present

## 2023-05-24 DIAGNOSIS — I10 Essential (primary) hypertension: Secondary | ICD-10-CM | POA: Diagnosis not present

## 2023-05-24 DIAGNOSIS — Z7689 Persons encountering health services in other specified circumstances: Secondary | ICD-10-CM | POA: Diagnosis not present

## 2023-05-24 DIAGNOSIS — L0201 Cutaneous abscess of face: Secondary | ICD-10-CM

## 2023-05-24 MED ORDER — AMOXICILLIN-POT CLAVULANATE 875-125 MG PO TABS
1.0000 | ORAL_TABLET | Freq: Two times a day (BID) | ORAL | 0 refills | Status: DC
Start: 2023-05-24 — End: 2023-05-24
  Filled 2023-05-24: qty 10, 5d supply, fill #0

## 2023-05-24 MED ORDER — AMOXICILLIN-POT CLAVULANATE 875-125 MG PO TABS
1.0000 | ORAL_TABLET | Freq: Two times a day (BID) | ORAL | 0 refills | Status: DC
Start: 2023-05-24 — End: 2023-06-11

## 2023-05-24 MED ORDER — ONDANSETRON HCL 4 MG PO TABS: 4.0000 mg | ORAL_TABLET | Freq: Three times a day (TID) | ORAL | 1 refills | Status: DC | PRN

## 2023-05-24 NOTE — Telephone Encounter (Signed)
Janice Williams called, she is wondering if Dr. Drue Second can change her cephalexin to something else. She thinks it's too strong for her and is causing nausea and stomach upset. She is also wondering if she can get a prescription for a sinus infection as well as the nausea. She is currently at her PCP, but they asked that Dr. Drue Second send this in for her.   [facility] [address].   Sandie Ano, RN

## 2023-05-24 NOTE — Telephone Encounter (Signed)
Per Dr. Drue Second, patient should stop cephalexin and start amox/clav 875mg  po bid x 5d.  Spoke with Eriel, advised to stop the cephalexin and that Dr. Drue Second is starting her on Augmentin BID x 5 days. Patient verbalized understanding and has no further questions.   Sandie Ano, RN

## 2023-05-24 NOTE — Assessment & Plan Note (Addendum)
Patient with history of COPD and allergic rhinitis presenting as an acute visit with 2-day history of congestion, mild cough with yellow sputum production, and generalized fatigue. She states that she had a recent sick contact in the form of a church member who later found out that she had the cold. Patient's symptoms have slightly worsened but she reports that she is still functional. She denies any fevers or chills. Her appetite remains good and she is drinking enough fluids. She does have well-controlled HIV with CD4 count of 674 and undetectable RNA quant.   Counseled patient on likely viral URI as cause of symptoms. Discussed supportive care to help with symptoms. She is already using a Neti pot and fluticasone daily. Advised that she can consider Coricidin HBP for symptom relief. Discussed that her symptoms should improve over the next couple of weeks or so. Provided return precautions as well.

## 2023-05-24 NOTE — Assessment & Plan Note (Signed)
On norvasc 10mg  daily for HTN. BP initially 131/92 but repeat BP 128/82, at goal.  -continue norvasc

## 2023-05-24 NOTE — Addendum Note (Signed)
Addended by: Linna Hoff D on: 05/24/2023 02:28 PM   Modules accepted: Orders

## 2023-05-24 NOTE — Patient Instructions (Signed)
Janice Williams,  It was a pleasure seeing you in the clinic today.   Your symptoms are likely because of a viral infection. Continue using your neti pot and fluticasone as you have been. I recommend Coricidin HBP to help relieve your cold symptoms, but your body will clear the infection on its own over time. Please come back to see [place] in 3 months for your next visit.  Please call our clinic at [phone] if you have any questions or concerns. The best time to call is Monday-Friday from 9am-4pm, but there is someone available 24/7 at the same number. If you need medication refills, please notify your pharmacy one week in advance and they will send [place] a request.   Thank you for letting us take part in your care. We look forward to seeing you next time!

## 2023-05-24 NOTE — Addendum Note (Signed)
Addended by: Linna Hoff D on: 05/24/2023 02:29 PM   Modules accepted: Orders

## 2023-05-24 NOTE — Progress Notes (Signed)
   CC: cold symptoms  HPI:  [name] is a 62 y.o. female with history listed below presenting to the [facility] for cold symptoms. Please see individualized problem based charting for full HPI.  Past Medical History:  Diagnosis Date   Anemia    Arthritis    generalized   Blood transfusion without reported diagnosis 2003   Bronchitis    Cataract    COPD (chronic obstructive pulmonary disease) (HCC)    COPD with acute exacerbation (HCC) 06/09/2017   Dental abscess 03/15/2015   DYSPEPSIA 02/14/2007   Qualifier: Diagnosis of  By: Knox Royalty     EXTERNAL OTITIS 01/06/2010   Qualifier: Diagnosis of  By: Philipp Deputy MD, Tresa Endo     FACIAL RASH 02/04/2009   Qualifier: Diagnosis of  By: Michelle Nasuti     Former smoker    quit 2014   GERD (gastroesophageal reflux disease)    Glaucoma    HIV infection (HCC)    Hypertension    Osteoporosis    Seasonal allergies    Stroke (HCC) 2003   Substance abuse (HCC)    history, clean 7 years   SVD (spontaneous vaginal delivery)    x 3    Review of Systems:  Negative aside from that listed in individualized problem based charting.  Physical Exam:  Vitals:   05/24/23 0958 05/24/23 1047  BP: (!) 131/92 128/82  Pulse: 98 92  Temp: 98.4 F (36.9 C)   TempSrc: Oral   SpO2: 100%   Weight: 94 lb 9.6 oz (42.9 kg)    Physical Exam Constitutional:      Appearance: She is not ill-appearing.     Comments: thin  HENT:     Nose: Congestion present. No rhinorrhea.     Right Sinus: Maxillary sinus tenderness and frontal sinus tenderness present.     Left Sinus: Maxillary sinus tenderness and frontal sinus tenderness present.     Comments: Mildly swollen nasal turbinates bilaterally    Mouth/Throat:     Mouth: Mucous membranes are moist.     Pharynx: Oropharynx is clear. No oropharyngeal exudate.  Eyes:     General: No scleral icterus.    Extraocular Movements: Extraocular movements intact.     Conjunctiva/sclera: Conjunctivae normal.      Pupils: Pupils are equal, round, and reactive to light.  Cardiovascular:     Rate and Rhythm: Normal rate and regular rhythm.     Heart sounds: Normal heart sounds. No murmur heard.    No friction rub. No gallop.  Pulmonary:     Effort: Pulmonary effort is normal.     Breath sounds: Normal breath sounds. No wheezing, rhonchi or rales.  Abdominal:     General: Bowel sounds are normal. There is no distension.     Palpations: Abdomen is soft.     Tenderness: There is no abdominal tenderness.  Musculoskeletal:        General: No swelling. Normal range of motion.  Skin:    General: Skin is warm and dry.  Neurological:     General: No focal deficit present.     Mental Status: She is alert and oriented to person, place, and time.  Psychiatric:        Mood and Affect: Mood normal.        Behavior: Behavior normal.      Assessment & Plan:   See Encounters Tab for problem based charting.  Patient discussed with Dr. Oswaldo Done

## 2023-05-25 ENCOUNTER — Other Ambulatory Visit: Payer: Medicaid Other

## 2023-05-25 NOTE — Progress Notes (Signed)
Internal Medicine Clinic Attending  Case discussed with Dr. Jinwala  At the time of the visit.  We reviewed the resident's history and exam and pertinent patient test results.  I agree with the assessment, diagnosis, and plan of care documented in the resident's note.  

## 2023-05-29 ENCOUNTER — Other Ambulatory Visit (HOSPITAL_COMMUNITY): Payer: Self-pay

## 2023-05-29 DIAGNOSIS — Z7689 Persons encountering health services in other specified circumstances: Secondary | ICD-10-CM | POA: Diagnosis not present

## 2023-05-30 ENCOUNTER — Ambulatory Visit
Admission: EM | Admit: 2023-05-30 | Discharge: 2023-05-30 | Disposition: A | Discharge status: Home / Self Care | Payer: Medicaid Other | Attending: Emergency Medicine | Admitting: Emergency Medicine

## 2023-05-30 ENCOUNTER — Ambulatory Visit (INDEPENDENT_AMBULATORY_CARE_PROVIDER_SITE_OTHER): Payer: Medicaid Other

## 2023-05-30 DIAGNOSIS — J069 Acute upper respiratory infection, unspecified: Secondary | ICD-10-CM | POA: Insufficient documentation

## 2023-05-30 DIAGNOSIS — H60393 Other infective otitis externa, bilateral: Secondary | ICD-10-CM | POA: Diagnosis not present

## 2023-05-30 DIAGNOSIS — J449 Chronic obstructive pulmonary disease, unspecified: Secondary | ICD-10-CM | POA: Diagnosis not present

## 2023-05-30 DIAGNOSIS — Z20822 Contact with and (suspected) exposure to covid-19: Secondary | ICD-10-CM | POA: Insufficient documentation

## 2023-05-30 DIAGNOSIS — R059 Cough, unspecified: Secondary | ICD-10-CM | POA: Diagnosis not present

## 2023-05-30 MED ORDER — NEOMYCIN-POLYMYXIN-HC 3.5-10000-1 OT SUSP: 4.0000 [drp] | Freq: Three times a day (TID) | OTIC | 0 refills | Status: DC

## 2023-05-30 NOTE — ED Triage Notes (Signed)
Pt c/o cough and congestion for over 2wks. States currently on antibiotics for a sinus infection. States her church is closed d/t covid out break on Friday.

## 2023-05-30 NOTE — ED Provider Notes (Signed)
[facility]    CSN: [id number] Arrival date & time: 05/30/23  1336      History   Chief Complaint Chief Complaint  Patient presents with   Cough    HPI RONIT PANDY is a 62 y.o. female. She has been sick for about 10 days now. Just finished augmentin for a sinus infection but doesn't feel like she is better. Church had outbreak of covid recently and she is very worried she could have covid. Wants testing. Has been coughing up yellow sputum and has noticed a slight wheeze but has not used rescue albuterol inhaler.   Also c/o several months of feelign like ears are clogged and itchy/sometimes painful. Has not sought tx for this before now.    Cough   Past Medical History:  Diagnosis Date   Anemia    Arthritis    generalized   Blood transfusion without reported diagnosis 2003   Bronchitis    Cataract    COPD (chronic obstructive pulmonary disease) (HCC)    COPD with acute exacerbation (HCC) 06/09/2017   Dental abscess 03/15/2015   DYSPEPSIA 02/14/2007   Qualifier: Diagnosis of  By: Knox Royalty     EXTERNAL OTITIS 01/06/2010   Qualifier: Diagnosis of  By: Philipp Deputy MD, Tresa Endo     FACIAL RASH 02/04/2009   Qualifier: Diagnosis of  By: Michelle Nasuti     Former smoker    quit 2014   GERD (gastroesophageal reflux disease)    Glaucoma    HIV infection (HCC)    Hypertension    Osteoporosis    Seasonal allergies    Stroke (HCC) 2003   Substance abuse (HCC)    history, clean 7 years   SVD (spontaneous vaginal delivery)    x 3    Patient Active Problem List   Diagnosis Date Noted   Viral URI with cough 05/24/2023   Epidermal inclusion cyst 12/22/2022   Rash of hand 12/21/2022   Former cigarette smoker 11/16/2022   Ankle swelling, left 06/28/2022   Sprain of calcaneofibular ligament 02/28/2022   Need for shingles vaccine 08/14/2021   Constipation 05/12/2021   Cough 03/30/2021   Screening for cervical cancer 03/18/2021   Ear pain, bilateral  03/17/2021   Degenerative disc disease, cervical 10/13/2020   Chronic, continuous use of opioids 09/10/2020   Rhinitis, chronic 06/10/2020   Restless legs syndrome (RLS) 01/08/2020   Osteoporosis 10/10/2019   COPD  GOLD 3  07/08/2019   Moderate protein-calorie malnutrition (HCC) 05/31/2018   Hypertension 06/08/2017   Left knee pain 03/15/2015   Healthcare maintenance 07/06/2011   Hyperlipidemia 06/03/2009   WEIGHT LOSS 06/03/2009   Human immunodeficiency virus (HIV) disease (HCC) 10/29/2008   GERD 10/29/2008   CEREBROVASCULAR ACCIDENT, HX OF 10/29/2008   TOBACCO DEPENDENCE 02/14/2007   Allergic rhinitis 02/14/2007   Symptoms concerning nutrition, metabolism, and development 02/14/2007    Past Surgical History:  Procedure Laterality Date   COLONOSCOPY  2019   KN-MAC-suprep(adeq)-tics/hems/TA x 5   MULTIPLE TOOTH EXTRACTIONS     with sedation   POLYPECTOMY  01/2008   TA x 5   TONSILLECTOMY  1973   TUBAL LIGATION     UPPER GI ENDOSCOPY     normal per patient - "years ago"    OB History     Gravida  3   Para  3   Term  3   Preterm      AB      Living  3  SAB      IAB      Ectopic      Multiple      Live Births  3            Home Medications    Prior to Admission medications   Medication Sig Start Date End Date Taking? Authorizing Provider  neomycin-polymyxin-hydrocortisone (CORTISPORIN) 3.5-10000-1 OTIC suspension Place 4 drops into both ears 3 (three) times daily. 05/30/23  Yes Cathlyn Parsons, NP  albuterol (VENTOLIN HFA) 108 (90 Base) MCG/ACT inhaler Inhale 2 puffs into the lungs every 6 (six) hours as needed for wheezing or shortness of breath. 02/28/23   Crissie Sickles, MD  amLODipine (NORVASC) 10 MG tablet Take 1 tablet (10 mg total) by mouth daily. 04/02/23 04/01/24  Steffanie Rainwater, MD  amoxicillin-clavulanate (AUGMENTIN) 875-125 MG tablet Take 1 tablet by mouth 2 (two) times daily. 05/24/23   Judyann Munson, MD  azithromycin  (ZITHROMAX) 250 MG tablet Take two today and then one daily until finished. Patient not taking: Reported on 05/17/2023 04/23/23   Nyoka Cowden, MD  bictegravir-emtricitabine-tenofovir AF (BIKTARVY) 50-200-25 MG TABS tablet Take 1 tablet by mouth daily. 01/31/23   Judyann Munson, MD  budesonide-formoterol [facility]) 80-4.5 MCG/ACT inhaler Inhale 2 puffs into the lungs 2 (two) times daily. 02/28/23   Crissie Sickles, MD  docusate sodium (COLACE) 100 MG capsule Take 1 capsule (100 mg total) by mouth daily as needed. 12/21/22 12/21/23  Lyndle Herrlich, MD  Ensure (ENSURE) Take 237 mLs by mouth 3 (three) times daily between meals. 01/23/23   Judyann Munson, MD  EPIPEN 2-PAK 0.3 MG/0.3ML SOAJ injection  03/21/22   [provider]  fluticasone Aleda Grana) 50 MCG/ACT nasal spray Use 1 spray(s) in each nostril once daily 08/07/22   Atway, Rayann N, DO  megestrol (MEGACE) 40 MG tablet Take 1 tablet (40 mg total) by mouth daily. 04/25/23   Judyann Munson, MD  mirtazapine (REMERON) 15 MG tablet Take 1 tablet (15 mg total) by mouth at bedtime. 04/02/23   Steffanie Rainwater, MD  omeprazole (PRILOSEC) 20 MG capsule Take 1 capsule (20 mg total) by mouth daily. 03/21/23   Crissie Sickles, MD  ondansetron (ZOFRAN) 4 MG tablet Take 1 tablet (4 mg total) by mouth every 8 (eight) hours as needed for nausea or vomiting. 05/24/23   Judyann Munson, MD  PRESCRIPTION MEDICATION Inject 1 Dose into the skin once a week. Allergy shot in office on Fridays    [provider]  RESTASIS 0.05 % ophthalmic emulsion 1 drop 2 (two) times daily. 08/05/21   [provider]  rosuvastatin (CRESTOR) 20 MG tablet Take 1 tablet (20 mg total) by mouth daily. 12/26/22 12/26/23  Lyndle Herrlich, MD  tiotropium (SPIRIVA HANDIHALER) 18 MCG inhalation capsule Place 1 capsule (18 mcg total) into inhaler and inhale daily. 02/28/23 05/29/23  Crissie Sickles, MD  traMADol (ULTRAM) 50 MG tablet Take 1 tablet (50 mg total) by mouth every  6 (six) hours as needed. 05/19/23 06/18/23  Crissie Sickles, MD  valACYclovir (VALTREX) 1000 MG tablet Take 1 tablet (1,000 mg total) by mouth 2 (two) times daily. 05/17/23   Judyann Munson, MD    Family History Family History  Problem Relation Age of Onset   Hypertension Mother    Hypertension Father    Hyperlipidemia Father    COPD Father    Atrial fibrillation Father    Colon cancer Neg Hx    Rectal cancer Neg Hx    Stomach cancer  Neg Hx    Colon polyps Neg Hx    Esophageal cancer Neg Hx     Social History Social History   Tobacco Use   Smoking status: Former    Packs/day: 2.00    Years: 38.00    Additional pack years: 0.00    Total pack years: 76.00    Types: Cigarettes    Start date: 12/18/2012    Quit date: 11/21/2013    Years since quitting: 9.5    Passive exposure: Past   Smokeless tobacco: Never  Vaping Use   Vaping Use: Never used  Substance Use Topics   Alcohol use: Not Currently   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     Allergies   Chantix [varenicline]   Review of Systems Review of Systems   Physical Exam Triage Vital Signs ED Triage Vitals [05/30/23 1409]  Enc Vitals Group     BP (!) 147/84     Pulse Rate (!) 104     Resp 18     Temp 99 F (37.2 C)     Temp Source Oral     SpO2 97 %     Weight      Height      Head Circumference      Peak Flow      Pain Score 0     Pain Loc      Pain Edu?      Excl. in GC?    No data found.  Updated Vital Signs BP (!) 147/84 (BP Location: Left Arm)   Pulse (!) 104   Temp 99 F (37.2 C) (Oral)   Resp 18   SpO2 97%   Visual Acuity Right Eye Distance:   Left Eye Distance:   Bilateral Distance:    Right Eye Near:   Left Eye Near:    Bilateral Near:     Physical Exam Constitutional:      General: She is not in acute distress.    Appearance: Normal appearance. She is not ill-appearing.     Comments: Tearful- very worried may have covid from church exposure  HENT:     Right Ear:  Tympanic membrane and external ear normal. Drainage and swelling present.     Left Ear: Tympanic membrane and external ear normal. Drainage and swelling present.     Ears:     Comments: Both ear canals slightly swollen/narrowed with scant white discharge present Cardiovascular:     Rate and Rhythm: Normal rate and regular rhythm.  Pulmonary:     Effort: Pulmonary effort is normal.     Breath sounds: Wheezing present.     Comments: Very faint wheezing in upper left and lower right lobe areas. Good air movement throughout lung fields. Pt coughed and produced sputum and it is clear Lymphadenopathy:     Head:     Right side of head: No submandibular, preauricular or posterior auricular adenopathy.     Left side of head: No submandibular, preauricular or posterior auricular adenopathy.  Neurological:     Mental Status: She is alert.      UC Treatments / Results  Labs (all labs ordered are listed, but only abnormal results are displayed) Labs Reviewed  SARS CORONAVIRUS 2 (TAT 6-24 HRS)    EKG   Radiology DG Chest 2 View  Result Date: 05/30/2023 CLINICAL DATA:  Cough with green sputum. Exposure to COVID. Symptoms for 2 weeks. EXAM: CHEST - 2 VIEW COMPARISON:  Two-view chest x-ray 12/12/2021 FINDINGS: The heart size is normal. The lungs are hyperexpanded. Changes of COPD are again noted. No superimposed edema or effusion is present. No focal airspace disease is present. Atherosclerotic changes are present in the aorta. IMPRESSION: 1. No acute cardiopulmonary disease. 2. COPD. Electronically Signed   By: Marin Roberts M.D.   On: 05/30/2023 15:08    Procedures Procedures (including critical care time)  Medications Ordered in UC Medications - No data to display  Initial Impression / Assessment and Plan / UC Course  I have reviewed the triage vital signs and the nursing notes.  Pertinent labs & imaging results that were available during my care of the patient were reviewed by  me and considered in my medical decision making (see chart for details).    Will test for covid, results pending. Discussed breathing treatment in urgent care with albuterol but pt declined, doesn't like how jittery it makes her feel. Prefers to go home and use own albuterol. Discussed supportive care for uri/copd exacerbation. Will call if xray shows pneumonia and if furhter antibiotics are needed. Willtx for otitis externa  Final Clinical Impressions(s) / UC Diagnoses   Final diagnoses:  Close exposure to COVID-19 virus  Viral URI with cough  Infective otitis externa of both ears     Discharge Instructions      Use your albuterol more often right now while you are sick. You can use it 2 puffs every 4-6 hours for wheezing or shortness of breath.   You will get a call if test is positive or xray abnormal, you will not get a call if tests are negative but you can check results in MyChart if you have a MyChart account.   If you have pnemonia on x-ray, you will need more antibiotics. If the x-ray doesn't show pneumonia, we can watch and wait and see how you do with time and using your albuterol more.   Keep using your neti pot and allergy medicine.   Consider taking Mucinex to help thin out mucus - drink lots of liquids like water with it.      ED Prescriptions     Medication Sig Dispense Auth. Provider   neomycin-polymyxin-hydrocortisone (CORTISPORIN) 3.5-10000-1 OTIC suspension Place 4 drops into both ears 3 (three) times daily. 10 mL Cathlyn Parsons, NP      PDMP not reviewed this encounter.   Cathlyn Parsons, NP 05/31/23 1014

## 2023-05-30 NOTE — Discharge Instructions (Addendum)
Use your albuterol more often right now while you are sick. You can use it 2 puffs every 4-6 hours for wheezing or shortness of breath.   You will get a call if test is positive or xray abnormal, you will not get a call if tests are negative but you can check results in MyChart if you have a MyChart account.   If you have pnemonia on x-ray, you will need more antibiotics. If the x-ray doesn't show pneumonia, we can watch and wait and see how you do with time and using your albuterol more.   Keep using your neti pot and allergy medicine.   Consider taking Mucinex to help thin out mucus - drink lots of liquids like water with it.

## 2023-05-31 ENCOUNTER — Telehealth: Payer: Self-pay

## 2023-05-31 ENCOUNTER — Other Ambulatory Visit (HOSPITAL_COMMUNITY): Payer: Self-pay

## 2023-05-31 ENCOUNTER — Telehealth: Payer: Self-pay | Admitting: Family Medicine

## 2023-05-31 LAB — SARS CORONAVIRUS 2 (TAT 6-24 HRS): SARS Coronavirus 2: POSITIVE — AB

## 2023-05-31 MED ORDER — NIRMATRELVIR/RITONAVIR (PAXLOVID)TABLET: 3.0000 | ORAL_TABLET | Freq: Two times a day (BID) | ORAL | 0 refills | Status: AC

## 2023-05-31 MED ORDER — NIRMATRELVIR/RITONAVIR (PAXLOVID)TABLET
3.0000 | ORAL_TABLET | Freq: Two times a day (BID) | ORAL | 0 refills | Status: DC
  Filled 2023-05-31: qty 30, 5d supply, fill #0

## 2023-05-31 NOTE — Telephone Encounter (Signed)
Patient has tested positive for COVID.  Staff has notified her of the positive result and have discussed the timing of her symptoms.  It sounds like she may have had some congestion for couple weeks but then she started feeling worse with malaise and other symptoms on Monday, June 10.  Her last EGFR was 4 in January of this year.  Paxlovid is sent to the pharmacy to prevent severe COVID

## 2023-05-31 NOTE — Telephone Encounter (Signed)
Patient called to inform Dr. Drue Second about pos covid test from yesterday. States this past weekend there was a covid outbreak at her church.  Urgent care has sent in prescription for Paxlovid. Would like to know if it would be okay to take this along with Biktarvy. Spoke with Cassie,Pharmacist who verbalized it would be okay to take both medications.  Patient will start paxlovid today. Was told to hold off on cholesterol medicine, due to it causing muscle spasms per [facility] pharmacist.  Juanita Laster, RMA

## 2023-06-04 NOTE — Telephone Encounter (Signed)
Patient called stating paxlovid is causing nausea and she is unable to keep taking it. The only sx's she is having is nasal congestion and sweats. denies fever. wanted to know if you had any other recommendations because she is stopping paxlovid.    Per Dr.Snider - okay to stop paxlovid -  its not making that much difference these days. its okay for her to stop.   Patient aware she can stop taking Paxlovid.   Mateusz Neilan Lesli Albee, CMA

## 2023-06-08 ENCOUNTER — Telehealth: Payer: Self-pay

## 2023-06-08 NOTE — Telephone Encounter (Signed)
Patient called, reports she noticed a boil on her inner thigh/groin area this morning about the size of a dime. It is becoming painful, no drainage present. She is requesting a prescription be sent to [facility] on [address].   She says she tried to contact her PCP but they are closed.   Sandie Ano, RN

## 2023-06-11 ENCOUNTER — Ambulatory Visit
Admission: EM | Admit: 2023-06-11 | Discharge: 2023-06-11 | Disposition: A | Discharge status: Home / Self Care | Payer: Medicaid Other | Attending: Emergency Medicine | Admitting: Emergency Medicine

## 2023-06-11 DIAGNOSIS — U071 COVID-19: Secondary | ICD-10-CM

## 2023-06-11 DIAGNOSIS — B20 Human immunodeficiency virus [HIV] disease: Secondary | ICD-10-CM | POA: Diagnosis not present

## 2023-06-11 DIAGNOSIS — N764 Abscess of vulva: Secondary | ICD-10-CM | POA: Diagnosis not present

## 2023-06-11 MED ORDER — SULFAMETHOXAZOLE-TRIMETHOPRIM 800-160 MG PO TABS: 1.0000 | ORAL_TABLET | Freq: Two times a day (BID) | ORAL | 0 refills | Status: DC

## 2023-06-11 NOTE — ED Triage Notes (Signed)
Pt reports she had a painful boil in the groin area x 2 days. States the boil started draining this morning.   Pt requested COVID test.

## 2023-06-11 NOTE — ED Provider Notes (Signed)
[facility]    CSN: [id number] Arrival date & time: 06/11/23  6213      History   Chief Complaint Chief Complaint  Patient presents with   Abscess   COVID TEST    HPI Janice Williams is a 62 y.o. female.   Patient presents today with a abscess to the vaginal area.  Patient states that she has chronic abscess and has been treating this for the past 4 days.  Patient states that he began to have drainage this a.m.  Denies any nausea vomiting diarrhea or fevers.  Patient is here for an antibiotic.  Patient states that she has recently been on Keflex approximately 2 weeks ago. Patient is also here to retest for COVID.  On 05/30/2023 patient tested positive for COVID.  She was instructed to return for a negative COVID test so patient can continue her allergy injections.  Patient states that she feels much better and does not have a cough at this time and no upper respiratory symptoms.    Past Medical History:  Diagnosis Date   Anemia    Arthritis    generalized   Blood transfusion without reported diagnosis 2003   Bronchitis    Cataract    COPD (chronic obstructive pulmonary disease) (HCC)    COPD with acute exacerbation (HCC) 06/09/2017   Dental abscess 03/15/2015   DYSPEPSIA 02/14/2007   Qualifier: Diagnosis of  By: Knox Royalty     EXTERNAL OTITIS 01/06/2010   Qualifier: Diagnosis of  By: Philipp Deputy MD, Tresa Endo     FACIAL RASH 02/04/2009   Qualifier: Diagnosis of  By: Michelle Nasuti     Former smoker    quit 2014   GERD (gastroesophageal reflux disease)    Glaucoma    HIV infection (HCC)    Hypertension    Osteoporosis    Seasonal allergies    Stroke (HCC) 2003   Substance abuse (HCC)    history, clean 7 years   SVD (spontaneous vaginal delivery)    x 3    Patient Active Problem List   Diagnosis Date Noted   Viral URI with cough 05/24/2023   Epidermal inclusion cyst 12/22/2022   Rash of hand 12/21/2022   Former cigarette smoker 11/16/2022   Ankle  swelling, left 06/28/2022   Sprain of calcaneofibular ligament 02/28/2022   Need for shingles vaccine 08/14/2021   Constipation 05/12/2021   Cough 03/30/2021   Screening for cervical cancer 03/18/2021   Ear pain, bilateral 03/17/2021   Degenerative disc disease, cervical 10/13/2020   Chronic, continuous use of opioids 09/10/2020   Rhinitis, chronic 06/10/2020   Restless legs syndrome (RLS) 01/08/2020   Osteoporosis 10/10/2019   COPD  GOLD 3  07/08/2019   Moderate protein-calorie malnutrition (HCC) 05/31/2018   Hypertension 06/08/2017   Left knee pain 03/15/2015   Healthcare maintenance 07/06/2011   Hyperlipidemia 06/03/2009   WEIGHT LOSS 06/03/2009   Human immunodeficiency virus (HIV) disease (HCC) 10/29/2008   GERD 10/29/2008   CEREBROVASCULAR ACCIDENT, HX OF 10/29/2008   TOBACCO DEPENDENCE 02/14/2007   Allergic rhinitis 02/14/2007   Symptoms concerning nutrition, metabolism, and development 02/14/2007    Past Surgical History:  Procedure Laterality Date   COLONOSCOPY  2019   KN-MAC-suprep(adeq)-tics/hems/TA x 5   MULTIPLE TOOTH EXTRACTIONS     with sedation   POLYPECTOMY  01/2008   TA x 5   TONSILLECTOMY  1973   TUBAL LIGATION     UPPER GI ENDOSCOPY     normal per  patient - "years ago"    OB History     Gravida  3   Para  3   Term  3   Preterm      AB      Living  3      SAB      IAB      Ectopic      Multiple      Live Births  3            Home Medications    Prior to Admission medications   Medication Sig Start Date End Date Taking? Authorizing Provider  sulfamethoxazole-trimethoprim (BACTRIM DS) 800-160 MG tablet Take 1 tablet by mouth 2 (two) times daily for 7 days. 06/11/23 06/18/23 Yes Coralyn Mark, NP  albuterol (VENTOLIN HFA) 108 (90 Base) MCG/ACT inhaler Inhale 2 puffs into the lungs every 6 (six) hours as needed for wheezing or shortness of breath. 02/28/23   Crissie Sickles, MD  amLODipine (NORVASC) 10 MG tablet Take 1  tablet (10 mg total) by mouth daily. 04/02/23 04/01/24  Steffanie Rainwater, MD  bictegravir-emtricitabine-tenofovir AF (BIKTARVY) 50-200-25 MG TABS tablet Take 1 tablet by mouth daily. 01/31/23   Judyann Munson, MD  budesonide-formoterol [facility]) 80-4.5 MCG/ACT inhaler Inhale 2 puffs into the lungs 2 (two) times daily. 02/28/23   Crissie Sickles, MD  docusate sodium (COLACE) 100 MG capsule Take 1 capsule (100 mg total) by mouth daily as needed. 12/21/22 12/21/23  Lyndle Herrlich, MD  Ensure (ENSURE) Take 237 mLs by mouth 3 (three) times daily between meals. 01/23/23   Judyann Munson, MD  EPIPEN 2-PAK 0.3 MG/0.3ML SOAJ injection  03/21/22   [provider]  fluticasone Aleda Grana) 50 MCG/ACT nasal spray Use 1 spray(s) in each nostril once daily 08/07/22   Atway, Rayann N, DO  megestrol (MEGACE) 40 MG tablet Take 1 tablet (40 mg total) by mouth daily. 04/25/23   Judyann Munson, MD  mirtazapine (REMERON) 15 MG tablet Take 1 tablet (15 mg total) by mouth at bedtime. 04/02/23   Steffanie Rainwater, MD  neomycin-polymyxin-hydrocortisone (CORTISPORIN) 3.5-10000-1 OTIC suspension Place 4 drops into both ears 3 (three) times daily. 05/30/23   Cathlyn Parsons, NP  omeprazole (PRILOSEC) 20 MG capsule Take 1 capsule (20 mg total) by mouth daily. 03/21/23   Crissie Sickles, MD  ondansetron (ZOFRAN) 4 MG tablet Take 1 tablet (4 mg total) by mouth every 8 (eight) hours as needed for nausea or vomiting. 05/24/23   Judyann Munson, MD  PRESCRIPTION MEDICATION Inject 1 Dose into the skin once a week. Allergy shot in office on Fridays    [provider]  RESTASIS 0.05 % ophthalmic emulsion 1 drop 2 (two) times daily. 08/05/21   [provider]  rosuvastatin (CRESTOR) 20 MG tablet Take 1 tablet (20 mg total) by mouth daily. 12/26/22 12/26/23  Lyndle Herrlich, MD  tiotropium (SPIRIVA HANDIHALER) 18 MCG inhalation capsule Place 1 capsule (18 mcg total) into inhaler and inhale daily. 02/28/23 05/29/23   Crissie Sickles, MD  traMADol (ULTRAM) 50 MG tablet Take 1 tablet (50 mg total) by mouth every 6 (six) hours as needed. 05/19/23 06/18/23  Crissie Sickles, MD  valACYclovir (VALTREX) 1000 MG tablet Take 1 tablet (1,000 mg total) by mouth 2 (two) times daily. 05/17/23   Judyann Munson, MD    Family History Family History  Problem Relation Age of Onset   Hypertension Mother    Hypertension Father    Hyperlipidemia Father    COPD Father  Atrial fibrillation Father    Colon cancer Neg Hx    Rectal cancer Neg Hx    Stomach cancer Neg Hx    Colon polyps Neg Hx    Esophageal cancer Neg Hx     Social History Social History   Tobacco Use   Smoking status: Former    Packs/day: 2.00    Years: 38.00    Additional pack years: 0.00    Total pack years: 76.00    Types: Cigarettes    Start date: 12/18/2012    Quit date: 11/21/2013    Years since quitting: 9.5    Passive exposure: Past   Smokeless tobacco: Never  Vaping Use   Vaping Use: Never used  Substance Use Topics   Alcohol use: Not Currently   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     Allergies   Chantix [varenicline]   Review of Systems Review of Systems  Constitutional:  Negative for activity change, chills and fever.  HENT: Negative.    Eyes: Negative.   Respiratory: Negative.    Cardiovascular: Negative.   Gastrointestinal: Negative.   Genitourinary: Negative.   Skin:        Moderate dime size abscess to left labia majora with active drainage  Neurological: Negative.      Physical Exam Triage Vital Signs ED Triage Vitals  Enc Vitals Group     BP 06/11/23 1017 (!) 140/81     Pulse Rate 06/11/23 1017 96     Resp 06/11/23 1017 18     Temp 06/11/23 1017 98.4 F (36.9 C)     Temp Source 06/11/23 1017 Oral     SpO2 06/11/23 1017 96 %     Weight --      Height --      Head Circumference --      Peak Flow --      Pain Score 06/11/23 1021 9     Pain Loc --      Pain Edu? --      Excl. in GC? --     No data found.  Updated Vital Signs BP (!) 140/81 (BP Location: Left Arm)   Pulse 96   Temp 98.4 F (36.9 C) (Oral)   Resp 18   SpO2 96%   Visual Acuity Right Eye Distance:   Left Eye Distance:   Bilateral Distance:    Right Eye Near:   Left Eye Near:    Bilateral Near:     Physical Exam Constitutional:      Appearance: Normal appearance.  HENT:     Right Ear: Tympanic membrane normal.     Left Ear: Tympanic membrane normal.     Nose: Nose normal.     Mouth/Throat:     Mouth: Mucous membranes are moist.  Cardiovascular:     Rate and Rhythm: Normal rate.  Pulmonary:     Effort: Pulmonary effort is normal.  Abdominal:     General: Abdomen is flat.  Skin:    Comments: Dime size at active drainage to the left side of labia majora.  Moderate amount of drainage at this time.  Light erythema surrounding tissue  Neurological:     Mental Status: She is alert.      UC Treatments / Results  Labs (all labs ordered are listed, but only abnormal results are displayed) Labs Reviewed  SARS CORONAVIRUS 2 (TAT 6-24 HRS)    EKG   Radiology No results found.  Procedures Procedures (including critical care time)  Medications Ordered in UC Medications - No data to display  Initial Impression / Assessment and Plan / UC Course  I have reviewed the triage vital signs and the nursing notes.  Pertinent labs & imaging results that were available during my care of the patient were reviewed by me and considered in my medical decision making (see chart for details).     Apply warm compresses Continue to manipulate the area for drainage Take full dose of antibiotics with food If symptoms become worse return for follow-up Your COVID test will be a send off to check your MyChart for results Final Clinical Impressions(s) / UC Diagnoses   Final diagnoses:  Abscess of labia  Lab test positive for detection of COVID-19 virus     Discharge Instructions      Apply warm  compresses Continue to manipulate the area for drainage Take full dose of antibiotics with food If symptoms become worse return for follow-up Your COVID test will be a send off to check your MyChart for results     ED Prescriptions     Medication Sig Dispense Auth. Provider   sulfamethoxazole-trimethoprim (BACTRIM DS) 800-160 MG tablet Take 1 tablet by mouth 2 (two) times daily for 7 days. 14 tablet Coralyn Mark, NP      PDMP not reviewed this encounter.   Coralyn Mark, NP 06/11/23 1042

## 2023-06-11 NOTE — Discharge Instructions (Addendum)
Apply warm compresses Continue to manipulate the area for drainage Take full dose of antibiotics with food If symptoms become worse return for follow-up Your COVID test will be a send off to check your MyChart for results

## 2023-06-12 DIAGNOSIS — B20 Human immunodeficiency virus [HIV] disease: Secondary | ICD-10-CM | POA: Diagnosis not present

## 2023-06-12 LAB — SARS CORONAVIRUS 2 (TAT 6-24 HRS): SARS Coronavirus 2: NEGATIVE

## 2023-06-13 DIAGNOSIS — B20 Human immunodeficiency virus [HIV] disease: Secondary | ICD-10-CM | POA: Diagnosis not present

## 2023-06-14 ENCOUNTER — Telehealth: Payer: Self-pay

## 2023-06-14 ENCOUNTER — Telehealth: Payer: Self-pay | Admitting: Family Medicine

## 2023-06-14 DIAGNOSIS — B20 Human immunodeficiency virus [HIV] disease: Secondary | ICD-10-CM | POA: Diagnosis not present

## 2023-06-14 MED ORDER — DOXYCYCLINE HYCLATE 100 MG PO CAPS: 100.0000 mg | ORAL_CAPSULE | Freq: Two times a day (BID) | ORAL | 0 refills | Status: AC

## 2023-06-14 NOTE — Telephone Encounter (Signed)
Patient called stating the Bactrim was making her nauseated. She is on the Bactrim for a recurrent abscess and has most recently been on Keflex before this.  The only stated allergy in the chart is Chantix.  Doxycycline is sent in as an alternative.

## 2023-06-14 NOTE — Telephone Encounter (Signed)
Patient called stating she was prescribed Bactrim for a vaginal boil on 06/11/23. Patient states the Bactrim is making her "stomach upset". Patient taking medication with food. Patient has been advised to reach out to her pcp or UC to discuss changing the antibiotic Treylan Mcclintock T Pricilla Loveless

## 2023-06-18 ENCOUNTER — Encounter: Payer: Self-pay | Admitting: Internal Medicine

## 2023-06-18 ENCOUNTER — Other Ambulatory Visit: Payer: Self-pay

## 2023-06-18 DIAGNOSIS — J301 Allergic rhinitis due to pollen: Secondary | ICD-10-CM

## 2023-06-18 DIAGNOSIS — Z961 Presence of intraocular lens: Secondary | ICD-10-CM | POA: Diagnosis not present

## 2023-06-18 DIAGNOSIS — B20 Human immunodeficiency virus [HIV] disease: Secondary | ICD-10-CM | POA: Diagnosis not present

## 2023-06-18 DIAGNOSIS — Z419 Encounter for procedure for purposes other than remedying health state, unspecified: Secondary | ICD-10-CM | POA: Diagnosis not present

## 2023-06-18 DIAGNOSIS — H5713 Ocular pain, bilateral: Secondary | ICD-10-CM | POA: Diagnosis not present

## 2023-06-18 DIAGNOSIS — H16223 Keratoconjunctivitis sicca, not specified as Sjogren's, bilateral: Secondary | ICD-10-CM | POA: Diagnosis not present

## 2023-06-18 DIAGNOSIS — Z7689 Persons encountering health services in other specified circumstances: Secondary | ICD-10-CM | POA: Diagnosis not present

## 2023-06-18 DIAGNOSIS — H524 Presbyopia: Secondary | ICD-10-CM | POA: Diagnosis not present

## 2023-06-18 DIAGNOSIS — F1190 Opioid use, unspecified, uncomplicated: Secondary | ICD-10-CM

## 2023-06-18 MED ORDER — FLUTICASONE PROPIONATE 50 MCG/ACT NA SUSP: NASAL | 0 refills | Status: DC

## 2023-06-18 MED ORDER — ROSUVASTATIN CALCIUM 20 MG PO TABS: 20.0000 mg | ORAL_TABLET | Freq: Every day | ORAL | 2 refills | Status: DC

## 2023-06-18 MED ORDER — TRAMADOL HCL 50 MG PO TABS
50.0000 mg | ORAL_TABLET | Freq: Four times a day (QID) | ORAL | 0 refills | Status: DC | PRN
Start: 2023-06-18 — End: 2023-07-17

## 2023-06-19 DIAGNOSIS — B20 Human immunodeficiency virus [HIV] disease: Secondary | ICD-10-CM | POA: Diagnosis not present

## 2023-06-20 ENCOUNTER — Other Ambulatory Visit (HOSPITAL_COMMUNITY): Payer: Self-pay

## 2023-06-20 ENCOUNTER — Other Ambulatory Visit: Payer: Self-pay | Admitting: Internal Medicine

## 2023-06-20 ENCOUNTER — Other Ambulatory Visit: Payer: Self-pay

## 2023-06-20 DIAGNOSIS — B20 Human immunodeficiency virus [HIV] disease: Secondary | ICD-10-CM | POA: Diagnosis not present

## 2023-06-20 DIAGNOSIS — Z7689 Persons encountering health services in other specified circumstances: Secondary | ICD-10-CM | POA: Diagnosis not present

## 2023-06-20 DIAGNOSIS — J3089 Other allergic rhinitis: Secondary | ICD-10-CM | POA: Diagnosis not present

## 2023-06-20 DIAGNOSIS — J301 Allergic rhinitis due to pollen: Secondary | ICD-10-CM | POA: Diagnosis not present

## 2023-06-20 DIAGNOSIS — J3081 Allergic rhinitis due to animal (cat) (dog) hair and dander: Secondary | ICD-10-CM | POA: Diagnosis not present

## 2023-06-20 MED ORDER — BIKTARVY 50-200-25 MG PO TABS
1.0000 | ORAL_TABLET | Freq: Every day | ORAL | 1 refills | Status: DC
Start: 2023-06-20 — End: 2023-08-21
  Filled 2023-06-20: qty 30, 30d supply, fill #0
  Filled 2023-07-20: qty 30, 30d supply, fill #1

## 2023-06-21 DIAGNOSIS — B20 Human immunodeficiency virus [HIV] disease: Secondary | ICD-10-CM | POA: Diagnosis not present

## 2023-06-22 ENCOUNTER — Other Ambulatory Visit: Payer: Self-pay

## 2023-06-22 ENCOUNTER — Other Ambulatory Visit: Payer: Self-pay | Admitting: Student

## 2023-06-22 DIAGNOSIS — K219 Gastro-esophageal reflux disease without esophagitis: Secondary | ICD-10-CM

## 2023-06-22 DIAGNOSIS — B20 Human immunodeficiency virus [HIV] disease: Secondary | ICD-10-CM | POA: Diagnosis not present

## 2023-06-22 MED ORDER — OMEPRAZOLE 20 MG PO CPDR: 20.0000 mg | DELAYED_RELEASE_CAPSULE | Freq: Every day | ORAL | 0 refills | Status: DC

## 2023-06-25 ENCOUNTER — Other Ambulatory Visit (HOSPITAL_COMMUNITY): Payer: Self-pay

## 2023-06-25 DIAGNOSIS — B20 Human immunodeficiency virus [HIV] disease: Secondary | ICD-10-CM | POA: Diagnosis not present

## 2023-06-26 DIAGNOSIS — B20 Human immunodeficiency virus [HIV] disease: Secondary | ICD-10-CM | POA: Diagnosis not present

## 2023-06-27 ENCOUNTER — Ambulatory Visit
Admission: RE | Admit: 2023-06-27 | Discharge: 2023-06-27 | Disposition: A | Discharge status: Home / Self Care | Payer: Medicaid Other | Source: Ambulatory Visit | Attending: Internal Medicine | Admitting: Internal Medicine

## 2023-06-27 DIAGNOSIS — B20 Human immunodeficiency virus [HIV] disease: Secondary | ICD-10-CM | POA: Diagnosis not present

## 2023-06-27 DIAGNOSIS — Z129 Encounter for screening for malignant neoplasm, site unspecified: Secondary | ICD-10-CM

## 2023-06-27 DIAGNOSIS — Z7689 Persons encountering health services in other specified circumstances: Secondary | ICD-10-CM | POA: Diagnosis not present

## 2023-06-27 DIAGNOSIS — Z87891 Personal history of nicotine dependence: Secondary | ICD-10-CM | POA: Diagnosis not present

## 2023-06-28 DIAGNOSIS — B20 Human immunodeficiency virus [HIV] disease: Secondary | ICD-10-CM | POA: Diagnosis not present

## 2023-06-29 DIAGNOSIS — J301 Allergic rhinitis due to pollen: Secondary | ICD-10-CM | POA: Diagnosis not present

## 2023-06-29 DIAGNOSIS — J3089 Other allergic rhinitis: Secondary | ICD-10-CM | POA: Diagnosis not present

## 2023-06-29 DIAGNOSIS — Z7689 Persons encountering health services in other specified circumstances: Secondary | ICD-10-CM | POA: Diagnosis not present

## 2023-06-29 MED ORDER — OMEPRAZOLE 20 MG PO CPDR
20.0000 mg | DELAYED_RELEASE_CAPSULE | Freq: Every day | ORAL | 3 refills | Status: DC
Start: 2023-06-29 — End: 2024-02-13

## 2023-06-29 NOTE — Telephone Encounter (Signed)
Spoke with Pasty Spillers at [facility]. Rx was sent by non MCD provider. Should be covered if upper Resident sends new Rx. Attempted to notify. Returned call to patient. No answer. Left message on VM requesting return call.

## 2023-06-29 NOTE — Telephone Encounter (Signed)
Patient called in stating MCD will no longer pay for omeprazole and she cannot afford to buy this OTC. She is requesting a different med for GERD.

## 2023-06-29 NOTE — Telephone Encounter (Signed)
Patient called back and is aware omeprazole was sent by Upper Res[facility] and should be covered by her MCD. She is very Adult nurse.

## 2023-06-29 NOTE — Addendum Note (Signed)
Addended by: Lucille Passy on: 06/29/2023 10:17 AM   Modules accepted: Orders

## 2023-07-02 DIAGNOSIS — B20 Human immunodeficiency virus [HIV] disease: Secondary | ICD-10-CM | POA: Diagnosis not present

## 2023-07-03 DIAGNOSIS — B20 Human immunodeficiency virus [HIV] disease: Secondary | ICD-10-CM | POA: Diagnosis not present

## 2023-07-04 ENCOUNTER — Other Ambulatory Visit: Payer: Self-pay | Admitting: Student

## 2023-07-04 DIAGNOSIS — B20 Human immunodeficiency virus [HIV] disease: Secondary | ICD-10-CM | POA: Diagnosis not present

## 2023-07-05 DIAGNOSIS — B20 Human immunodeficiency virus [HIV] disease: Secondary | ICD-10-CM | POA: Diagnosis not present

## 2023-07-05 NOTE — Addendum Note (Signed)
Addended by: Fredderick Severance on: 07/05/2023 10:24 AM   Modules accepted: Orders

## 2023-07-09 ENCOUNTER — Other Ambulatory Visit: Payer: Self-pay | Admitting: Internal Medicine

## 2023-07-09 DIAGNOSIS — B20 Human immunodeficiency virus [HIV] disease: Secondary | ICD-10-CM | POA: Diagnosis not present

## 2023-07-10 DIAGNOSIS — B20 Human immunodeficiency virus [HIV] disease: Secondary | ICD-10-CM | POA: Diagnosis not present

## 2023-07-11 DIAGNOSIS — B20 Human immunodeficiency virus [HIV] disease: Secondary | ICD-10-CM | POA: Diagnosis not present

## 2023-07-12 DIAGNOSIS — B20 Human immunodeficiency virus [HIV] disease: Secondary | ICD-10-CM | POA: Diagnosis not present

## 2023-07-13 DIAGNOSIS — Z7689 Persons encountering health services in other specified circumstances: Secondary | ICD-10-CM | POA: Diagnosis not present

## 2023-07-13 DIAGNOSIS — J3089 Other allergic rhinitis: Secondary | ICD-10-CM | POA: Diagnosis not present

## 2023-07-13 DIAGNOSIS — J3081 Allergic rhinitis due to animal (cat) (dog) hair and dander: Secondary | ICD-10-CM | POA: Diagnosis not present

## 2023-07-13 DIAGNOSIS — J301 Allergic rhinitis due to pollen: Secondary | ICD-10-CM | POA: Diagnosis not present

## 2023-07-16 DIAGNOSIS — B20 Human immunodeficiency virus [HIV] disease: Secondary | ICD-10-CM | POA: Diagnosis not present

## 2023-07-17 ENCOUNTER — Other Ambulatory Visit: Payer: Self-pay

## 2023-07-17 ENCOUNTER — Other Ambulatory Visit: Payer: Self-pay | Admitting: Student

## 2023-07-17 DIAGNOSIS — B20 Human immunodeficiency virus [HIV] disease: Secondary | ICD-10-CM | POA: Diagnosis not present

## 2023-07-17 DIAGNOSIS — F1190 Opioid use, unspecified, uncomplicated: Secondary | ICD-10-CM

## 2023-07-17 MED ORDER — TRAMADOL HCL 50 MG PO TABS
50.0000 mg | ORAL_TABLET | Freq: Four times a day (QID) | ORAL | 0 refills | Status: AC | PRN
Start: 2023-07-17 — End: 2023-08-16

## 2023-07-18 DIAGNOSIS — B20 Human immunodeficiency virus [HIV] disease: Secondary | ICD-10-CM | POA: Diagnosis not present

## 2023-07-19 DIAGNOSIS — Z419 Encounter for procedure for purposes other than remedying health state, unspecified: Secondary | ICD-10-CM | POA: Diagnosis not present

## 2023-07-19 DIAGNOSIS — B20 Human immunodeficiency virus [HIV] disease: Secondary | ICD-10-CM | POA: Diagnosis not present

## 2023-07-20 ENCOUNTER — Telehealth: Payer: Self-pay

## 2023-07-20 ENCOUNTER — Telehealth: Payer: Self-pay | Admitting: *Deleted

## 2023-07-20 ENCOUNTER — Other Ambulatory Visit: Payer: Self-pay

## 2023-07-20 DIAGNOSIS — J3089 Other allergic rhinitis: Secondary | ICD-10-CM | POA: Diagnosis not present

## 2023-07-20 DIAGNOSIS — Z7689 Persons encountering health services in other specified circumstances: Secondary | ICD-10-CM | POA: Diagnosis not present

## 2023-07-20 DIAGNOSIS — J301 Allergic rhinitis due to pollen: Secondary | ICD-10-CM | POA: Diagnosis not present

## 2023-07-20 DIAGNOSIS — J3081 Allergic rhinitis due to animal (cat) (dog) hair and dander: Secondary | ICD-10-CM | POA: Diagnosis not present

## 2023-07-20 NOTE — Telephone Encounter (Signed)
Decision:Approved Janice Williams (Key: [id number]) PA Case ID #: [id number] Rx #: [id number] Need Help? Call us at [phone] Outcome Approved today by [facility] Medicaid 2017 Approved. This drug has been approved. Approved quantity: 60 tablets per 15 day(s). You may fill up to a 34 day supply at a retail pharmacy. You may fill up to a 90 day supply for maintenance drugs, please refer to the formulary for details. Please call the pharmacy to process your prescription claim. Authorization Expiration Date: 01/16/2024 Drug traMADol HCl 50MG  tablets ePA cloud logo Form [facility] Medicaid of [employer] Electronic Prior Authorization Request Form [phone] NCPDP) Original Claim Info 75 ACUTE PAIN MAX 5 DS. POST OP PAIN SUBMIT [facility] [id number] FOR MAX 7 DS. SUBMIT DX CODE FOR CANCER OR SICKLE CELLSubmit 3 DS For Emerg Fill with PA Type01, PA Number [id number], Level of Service 3

## 2023-07-20 NOTE — Telephone Encounter (Signed)
Prior Authorization for patient (Tramadol) came through on cover my meds was submitted with last office notes awaiting approval or denial.  NGE:[id number]

## 2023-07-23 DIAGNOSIS — B20 Human immunodeficiency virus [HIV] disease: Secondary | ICD-10-CM | POA: Diagnosis not present

## 2023-07-24 ENCOUNTER — Encounter: Payer: Medicaid Other | Admitting: Student

## 2023-07-24 DIAGNOSIS — B20 Human immunodeficiency virus [HIV] disease: Secondary | ICD-10-CM | POA: Diagnosis not present

## 2023-07-25 DIAGNOSIS — B20 Human immunodeficiency virus [HIV] disease: Secondary | ICD-10-CM | POA: Diagnosis not present

## 2023-07-26 DIAGNOSIS — B20 Human immunodeficiency virus [HIV] disease: Secondary | ICD-10-CM | POA: Diagnosis not present

## 2023-07-27 DIAGNOSIS — Z7689 Persons encountering health services in other specified circumstances: Secondary | ICD-10-CM | POA: Diagnosis not present

## 2023-07-27 DIAGNOSIS — J301 Allergic rhinitis due to pollen: Secondary | ICD-10-CM | POA: Diagnosis not present

## 2023-07-27 DIAGNOSIS — J3081 Allergic rhinitis due to animal (cat) (dog) hair and dander: Secondary | ICD-10-CM | POA: Diagnosis not present

## 2023-07-27 DIAGNOSIS — J3089 Other allergic rhinitis: Secondary | ICD-10-CM | POA: Diagnosis not present

## 2023-07-30 DIAGNOSIS — H938X3 Other specified disorders of ear, bilateral: Secondary | ICD-10-CM | POA: Diagnosis not present

## 2023-07-30 DIAGNOSIS — L299 Pruritus, unspecified: Secondary | ICD-10-CM | POA: Diagnosis not present

## 2023-07-30 DIAGNOSIS — Z7689 Persons encountering health services in other specified circumstances: Secondary | ICD-10-CM | POA: Diagnosis not present

## 2023-07-30 DIAGNOSIS — B20 Human immunodeficiency virus [HIV] disease: Secondary | ICD-10-CM | POA: Diagnosis not present

## 2023-07-31 DIAGNOSIS — B20 Human immunodeficiency virus [HIV] disease: Secondary | ICD-10-CM | POA: Diagnosis not present

## 2023-08-01 ENCOUNTER — Ambulatory Visit: Payer: Medicaid Other | Admitting: Internal Medicine

## 2023-08-01 DIAGNOSIS — B20 Human immunodeficiency virus [HIV] disease: Secondary | ICD-10-CM | POA: Diagnosis not present

## 2023-08-02 DIAGNOSIS — B20 Human immunodeficiency virus [HIV] disease: Secondary | ICD-10-CM | POA: Diagnosis not present

## 2023-08-03 DIAGNOSIS — J3089 Other allergic rhinitis: Secondary | ICD-10-CM | POA: Diagnosis not present

## 2023-08-03 DIAGNOSIS — J301 Allergic rhinitis due to pollen: Secondary | ICD-10-CM | POA: Diagnosis not present

## 2023-08-03 DIAGNOSIS — Z7689 Persons encountering health services in other specified circumstances: Secondary | ICD-10-CM | POA: Diagnosis not present

## 2023-08-03 DIAGNOSIS — J3081 Allergic rhinitis due to animal (cat) (dog) hair and dander: Secondary | ICD-10-CM | POA: Diagnosis not present

## 2023-08-06 ENCOUNTER — Ambulatory Visit: Payer: Medicaid Other | Admitting: Internal Medicine

## 2023-08-06 DIAGNOSIS — B20 Human immunodeficiency virus [HIV] disease: Secondary | ICD-10-CM | POA: Diagnosis not present

## 2023-08-07 DIAGNOSIS — B20 Human immunodeficiency virus [HIV] disease: Secondary | ICD-10-CM | POA: Diagnosis not present

## 2023-08-08 ENCOUNTER — Other Ambulatory Visit: Payer: Self-pay

## 2023-08-08 ENCOUNTER — Ambulatory Visit (INDEPENDENT_AMBULATORY_CARE_PROVIDER_SITE_OTHER): Payer: Medicaid Other | Admitting: Student

## 2023-08-08 ENCOUNTER — Encounter: Payer: Self-pay | Admitting: Internal Medicine

## 2023-08-08 ENCOUNTER — Encounter: Payer: Self-pay | Admitting: Student

## 2023-08-08 VITALS — BP 122/70 | HR 84 | Temp 98.6°F | Ht 63.0 in | Wt 99.2 lb

## 2023-08-08 DIAGNOSIS — E785 Hyperlipidemia, unspecified: Secondary | ICD-10-CM

## 2023-08-08 DIAGNOSIS — E559 Vitamin D deficiency, unspecified: Secondary | ICD-10-CM | POA: Diagnosis not present

## 2023-08-08 DIAGNOSIS — Z87891 Personal history of nicotine dependence: Secondary | ICD-10-CM

## 2023-08-08 DIAGNOSIS — Z7689 Persons encountering health services in other specified circumstances: Secondary | ICD-10-CM | POA: Diagnosis not present

## 2023-08-08 DIAGNOSIS — I10 Essential (primary) hypertension: Secondary | ICD-10-CM | POA: Diagnosis not present

## 2023-08-08 DIAGNOSIS — R21 Rash and other nonspecific skin eruption: Secondary | ICD-10-CM

## 2023-08-08 DIAGNOSIS — J449 Chronic obstructive pulmonary disease, unspecified: Secondary | ICD-10-CM | POA: Diagnosis not present

## 2023-08-08 DIAGNOSIS — B20 Human immunodeficiency virus [HIV] disease: Secondary | ICD-10-CM | POA: Diagnosis not present

## 2023-08-08 MED ORDER — ACYCLOVIR 400 MG PO TABS: 400.0000 mg | ORAL_TABLET | Freq: Three times a day (TID) | ORAL | 0 refills | Status: DC

## 2023-08-08 NOTE — Assessment & Plan Note (Signed)
LDL 127 in January 2024. Managed with Crestor 20 mg/daily. Patient with history of CVA with LDL goal of < 70. -Order Lipid panel -Continue Crestor 20 mg pending lipid panel

## 2023-08-08 NOTE — Assessment & Plan Note (Addendum)
Stable.Physical exam unremarkable. Follows with pulmonology. -Continue Albuterol prn, Symbicort, Flonase, and Spiriva

## 2023-08-08 NOTE — Patient Instructions (Signed)
Thank you for allowing me to be a part of your care team. Today we discussed a few things:  I sent in a prescription for Acyclovir, please take as prescribed for the rash on your hands I will let you know about your blood work

## 2023-08-08 NOTE — Assessment & Plan Note (Signed)
Stable. Continue Amlodipine 10 mg daily.

## 2023-08-08 NOTE — Assessment & Plan Note (Signed)
Vitamin D was 17 in 2018. Patient not on supplementation. - Order Vitamin D level - Supplementation to be ordered based on results

## 2023-08-08 NOTE — Assessment & Plan Note (Addendum)
Patient endorses one week history of rash on hands (see physical exam). Presentation consistent with herpetic whitlow. Patient has HIV but is on Biktarvy (HIV RNA nearly undetectable and CD4 counts wnl as of 12/2022). RPR non-reactive 12/2022. Patient has had one prior episode in 2023 and was prescribed Valtrex, but patient unable to tolerate due to GI side effects. -Acyclovir 400 mg TID for 5 days

## 2023-08-08 NOTE — Progress Notes (Signed)
CC: Follow-up  HPI:  [name] is a 62 y.o. female living with a history stated below and presents today for a follow-up. Please see problem based assessment and plan for additional details.  Past Medical History:  Diagnosis Date   Anemia    Arthritis    generalized   Blood transfusion without reported diagnosis 2003   Bronchitis    Cataract    COPD (chronic obstructive pulmonary disease) (HCC)    COPD with acute exacerbation (HCC) 06/09/2017   Dental abscess 03/15/2015   DYSPEPSIA 02/14/2007   Qualifier: Diagnosis of  By: Knox Royalty     EXTERNAL OTITIS 01/06/2010   Qualifier: Diagnosis of  By: Philipp Deputy MD, Tresa Endo     FACIAL RASH 02/04/2009   Qualifier: Diagnosis of  By: Michelle Nasuti     Former smoker    quit 2014   GERD (gastroesophageal reflux disease)    Glaucoma    HIV infection (HCC)    Hypertension    Osteoporosis    Seasonal allergies    Stroke (HCC) 2003   Substance abuse (HCC)    history, clean 7 years   SVD (spontaneous vaginal delivery)    x 3    Current Outpatient Medications on File Prior to Visit  Medication Sig Dispense Refill   albuterol (VENTOLIN HFA) 108 (90 Base) MCG/ACT inhaler Inhale 2 puffs into the lungs every 6 (six) hours as needed for wheezing or shortness of breath. 18 g 1   amLODipine (NORVASC) 10 MG tablet Take 1 tablet (10 mg total) by mouth daily. 90 tablet 3   bictegravir-emtricitabine-tenofovir AF (BIKTARVY) 50-200-25 MG TABS tablet Take 1 tablet by mouth daily. 30 tablet 1   budesonide-formoterol (SYMBICORT) 80-4.5 MCG/ACT inhaler Inhale 2 puffs into the lungs 2 (two) times daily. 1 each 5   docusate sodium (COLACE) 100 MG capsule Take 1 capsule (100 mg total) by mouth daily as needed. 30 capsule 0   Ensure (ENSURE) Take 237 mLs by mouth 3 (three) times daily between meals. 237 mL 5   EPIPEN 2-PAK 0.3 MG/0.3ML SOAJ injection      fluticasone (FLONASE) 50 MCG/ACT nasal spray Use 1 spray(s) in each nostril once daily 16 g  0   megestrol (MEGACE) 40 MG tablet Take 1 tablet (40 mg total) by mouth daily. 30 tablet 3   mirtazapine (REMERON) 15 MG tablet TAKE 1 TABLET BY MOUTH EVERYDAY AT BEDTIME 30 tablet 2   neomycin-polymyxin-hydrocortisone (CORTISPORIN) 3.5-10000-1 OTIC suspension Place 4 drops into both ears 3 (three) times daily. 10 mL 0   omeprazole (PRILOSEC) 20 MG capsule Take 1 capsule (20 mg total) by mouth daily. 90 capsule 3   ondansetron (ZOFRAN) 4 MG tablet Take 1 tablet (4 mg total) by mouth every 8 (eight) hours as needed for nausea or vomiting. 20 tablet 1   PRESCRIPTION MEDICATION Inject 1 Dose into the skin once a week. Allergy shot in office on Fridays     RESTASIS 0.05 % ophthalmic emulsion 1 drop 2 (two) times daily.     rosuvastatin (CRESTOR) 20 MG tablet Take 1 tablet (20 mg total) by mouth daily. 30 tablet 2   tiotropium (SPIRIVA HANDIHALER) 18 MCG inhalation capsule Place 1 capsule (18 mcg total) into inhaler and inhale daily. 30 capsule 2   traMADol (ULTRAM) 50 MG tablet Take 1 tablet (50 mg total) by mouth every 6 (six) hours as needed. 60 tablet 0   No current facility-administered medications on file prior to visit.  Family History  Problem Relation Age of Onset   Hypertension Mother    Hypertension Father    Hyperlipidemia Father    COPD Father    Atrial fibrillation Father    Colon cancer Neg Hx    Rectal cancer Neg Hx    Stomach cancer Neg Hx    Colon polyps Neg Hx    Esophageal cancer Neg Hx     Social History   Socioeconomic History   Marital status: Single    Spouse name: Not on file   Number of children: 3   Years of education: Not on file   Highest education level: Not on file  Occupational History   Not on file  Tobacco Use   Smoking status: Former    Current packs/day: 0.00    Average packs/day: 2.0 packs/day for 38.0 years (76.1 ttl pk-yrs)    Types: Cigarettes    Start date: 12/18/2012    Quit date: 11/21/2013    Years since quitting: 9.7    Passive  exposure: Past   Smokeless tobacco: Never  Vaping Use   Vaping status: Never Used  Substance and Sexual Activity   Alcohol use: Not Currently   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   Sexual activity: Not Currently    Partners: Male    Comment: declined condoms  Other Topics Concern   Not on file  Social History Narrative   Not on file   Social Determinants of Health   Financial Resource Strain: Not on file  Food Insecurity: Low Risk  (07/30/2023)   Received from [facility]   Food vital sign    Within the past 12 months, you worried that your food would run out before you got money to buy more: Never true    Within the past 12 months, the food you bought just didn't last and you didn't have money to get more. : Never true  Transportation Needs: Not on file (07/30/2023)  Physical Activity: Not on file  Stress: Not on file  Social Connections: Not on file  Intimate Partner Violence: Not on file    Review of Systems: ROS negative except for what is noted on the assessment and plan.  Vitals:   08/08/23 0920  BP: 122/70  Pulse: 84  Temp: 98.6 F (37 C)  TempSrc: Oral  SpO2: 100%  Weight: 99 lb 3.2 oz (45 kg)  Height: 5\' 3"  (1.6 m)    Physical Exam: Constitutional: well-appearing, sitting in chair, in no acute distress Cardiovascular: regular rate and rhythm, no m/r/g Pulmonary/Chest: normal work of breathing on room air, lungs clear to auscultation bilaterally Skin: Scattered vesicles on erythematous base as well as bullae on bilateral palms/first digit Psych: normal mood and behavior  Assessment & Plan:     Patient seen with Dr. Cleda Daub  Hypertension Stable. -Continue Amlodipine 10 mg/daily  COPD  GOLD 3  Stable.Physical exam unremarkable. Follows with pulmonology. -Continue Albuterol prn, Symbicort, Flonase, and Spiriva  Rash of hand Patient endorses one week history of rash on hands (see physical exam). Presentation consistent with  herpetic whitlow. Patient has HIV but is on Biktarvy (HIV RNA nearly undetectable and CD4 counts wnl as of 12/2022). RPR non-reactive 12/2022. Patient has had one prior episode in 2023 and was prescribed Valtrex, but patient unable to tolerate due to GI side effects. -Acyclovir 400 mg TID for 5 days  Hyperlipidemia LDL 127 in January 2024. Managed with Crestor 20 mg/daily.  Patient with history of CVA with LDL goal of < 70. -Order Lipid panel -Continue Crestor 20 mg pending lipid panel  Vitamin D deficiency Vitamin D was 17 in 2018. Patient not on supplementation. - Order Vitamin D level - Supplementation to be ordered based on results   Carmina Miller, D.O. [facility] Internal Medicine, PGY-1 Phone: [phone] Date 08/08/2023 Time 1:22 PM

## 2023-08-09 DIAGNOSIS — B20 Human immunodeficiency virus [HIV] disease: Secondary | ICD-10-CM | POA: Diagnosis not present

## 2023-08-09 LAB — LIPID PANEL
Chol/HDL Ratio: 2 ratio (ref 0.0–4.4)
Cholesterol, Total: 194 mg/dL (ref 100–199)
HDL: 96 mg/dL (ref >39)
LDL Chol Calc (NIH): 87 mg/dL (ref 0–99)
Triglycerides: 56 mg/dL (ref 0–149)
VLDL Cholesterol Cal: 11 mg/dL (ref 5–40)

## 2023-08-09 LAB — VITAMIN D 25 HYDROXY (VIT D DEFICIENCY, FRACTURES): Vit D, 25-Hydroxy: 35.1 ng/mL (ref 30.0–100.0)

## 2023-08-10 ENCOUNTER — Telehealth: Payer: Self-pay | Admitting: *Deleted

## 2023-08-10 DIAGNOSIS — J3089 Other allergic rhinitis: Secondary | ICD-10-CM | POA: Diagnosis not present

## 2023-08-10 DIAGNOSIS — Z7689 Persons encountering health services in other specified circumstances: Secondary | ICD-10-CM | POA: Diagnosis not present

## 2023-08-10 DIAGNOSIS — J301 Allergic rhinitis due to pollen: Secondary | ICD-10-CM | POA: Diagnosis not present

## 2023-08-10 DIAGNOSIS — J3081 Allergic rhinitis due to animal (cat) (dog) hair and dander: Secondary | ICD-10-CM | POA: Diagnosis not present

## 2023-08-10 NOTE — Patient Outreach (Addendum)
  Care Management   Note  08/10/2023 Name: Janice Williams MRN: [id number] DOB: 10-19-1961  [name] is enrolled in a Managed Medicaid plan: Yes. Outreach attempt today was successful.   Successful telephone outreach with Ms. Cuppy today. Patient was active with MM Care Team in 2022, but was lost to follow up.   The patient was given information about care management services as a benefit of their Medicaid health plan today.    Patient                                              agreed to services and verbal consent obtained.    An initial telephone outreach has been scheduled for: 08/16/23 @ 2:30pm  Estanislado Emms RN, BSN [place]  Value-Based [facility] Health RN Care Coordinator [phone]

## 2023-08-11 ENCOUNTER — Other Ambulatory Visit: Payer: Self-pay | Admitting: Student

## 2023-08-13 ENCOUNTER — Other Ambulatory Visit: Payer: Self-pay

## 2023-08-13 DIAGNOSIS — B20 Human immunodeficiency virus [HIV] disease: Secondary | ICD-10-CM | POA: Diagnosis not present

## 2023-08-13 NOTE — Progress Notes (Signed)
Internal Medicine Clinic Attending  I was physically present during the key portions of the resident provided service and participated in the medical decision making of patient's management care. I reviewed pertinent patient test results.  The assessment, diagnosis, and plan were formulated together and I agree with the documentation in the resident's note.  Gust Rung, DO

## 2023-08-14 DIAGNOSIS — H903 Sensorineural hearing loss, bilateral: Secondary | ICD-10-CM | POA: Diagnosis not present

## 2023-08-14 DIAGNOSIS — L299 Pruritus, unspecified: Secondary | ICD-10-CM | POA: Diagnosis not present

## 2023-08-14 DIAGNOSIS — Z7689 Persons encountering health services in other specified circumstances: Secondary | ICD-10-CM | POA: Diagnosis not present

## 2023-08-14 DIAGNOSIS — B20 Human immunodeficiency virus [HIV] disease: Secondary | ICD-10-CM | POA: Diagnosis not present

## 2023-08-14 DIAGNOSIS — H938X1 Other specified disorders of right ear: Secondary | ICD-10-CM | POA: Diagnosis not present

## 2023-08-15 DIAGNOSIS — B20 Human immunodeficiency virus [HIV] disease: Secondary | ICD-10-CM | POA: Diagnosis not present

## 2023-08-16 ENCOUNTER — Other Ambulatory Visit: Payer: Medicaid Other | Admitting: *Deleted

## 2023-08-16 DIAGNOSIS — B20 Human immunodeficiency virus [HIV] disease: Secondary | ICD-10-CM | POA: Diagnosis not present

## 2023-08-16 NOTE — Patient Outreach (Signed)
Care Coordination  08/16/2023  VALAREE LAZZARO 05-07-1961 [id number]   Successful outreach with Ms. Dangelo today. However, she has a migraine and request to reschedule. A new appointment was made for 08/22/23 at 1pm. Ms. Jaiswal agreed to new date and time.  Estanislado Emms RN, BSN [place]  Value[facility]Based Care Institute [facility] RN Care Coordinator [phone]

## 2023-08-17 ENCOUNTER — Other Ambulatory Visit (HOSPITAL_COMMUNITY): Payer: Self-pay

## 2023-08-17 ENCOUNTER — Other Ambulatory Visit: Payer: Self-pay

## 2023-08-17 ENCOUNTER — Telehealth: Payer: Self-pay

## 2023-08-17 DIAGNOSIS — J3089 Other allergic rhinitis: Secondary | ICD-10-CM | POA: Diagnosis not present

## 2023-08-17 DIAGNOSIS — Z7689 Persons encountering health services in other specified circumstances: Secondary | ICD-10-CM | POA: Diagnosis not present

## 2023-08-17 DIAGNOSIS — J301 Allergic rhinitis due to pollen: Secondary | ICD-10-CM | POA: Diagnosis not present

## 2023-08-17 DIAGNOSIS — J3081 Allergic rhinitis due to animal (cat) (dog) hair and dander: Secondary | ICD-10-CM | POA: Diagnosis not present

## 2023-08-17 NOTE — Telephone Encounter (Signed)
Pt is requesting her  Tramadol  to be sent in ... But it is no longer on her med list it says D/C  with the reason of reorder  on 06/18/23..      Going to the :    [facility] (Ph: [phone])      Pt has an upcoming appt with DR Lily Kocher 11/25

## 2023-08-19 DIAGNOSIS — Z419 Encounter for procedure for purposes other than remedying health state, unspecified: Secondary | ICD-10-CM | POA: Diagnosis not present

## 2023-08-20 DIAGNOSIS — B20 Human immunodeficiency virus [HIV] disease: Secondary | ICD-10-CM | POA: Diagnosis not present

## 2023-08-21 ENCOUNTER — Other Ambulatory Visit: Payer: Self-pay | Admitting: Internal Medicine

## 2023-08-21 ENCOUNTER — Other Ambulatory Visit (HOSPITAL_COMMUNITY): Payer: Self-pay

## 2023-08-21 ENCOUNTER — Other Ambulatory Visit: Payer: Self-pay

## 2023-08-21 ENCOUNTER — Telehealth: Payer: Self-pay | Admitting: *Deleted

## 2023-08-21 ENCOUNTER — Other Ambulatory Visit: Payer: Self-pay | Admitting: Student

## 2023-08-21 DIAGNOSIS — B20 Human immunodeficiency virus [HIV] disease: Secondary | ICD-10-CM | POA: Diagnosis not present

## 2023-08-21 DIAGNOSIS — R634 Abnormal weight loss: Secondary | ICD-10-CM

## 2023-08-21 DIAGNOSIS — F1190 Opioid use, unspecified, uncomplicated: Secondary | ICD-10-CM

## 2023-08-21 MED ORDER — MEGESTROL ACETATE 40 MG PO TABS
40.0000 mg | ORAL_TABLET | Freq: Every day | ORAL | 3 refills | Status: DC
Start: 2023-08-21 — End: 2023-12-26
  Filled 2023-08-21: qty 30, 30d supply, fill #0
  Filled 2023-09-26: qty 30, 30d supply, fill #1
  Filled 2023-10-27: qty 30, 30d supply, fill #2
  Filled 2023-11-28: qty 30, 30d supply, fill #3

## 2023-08-21 MED ORDER — BIKTARVY 50-200-25 MG PO TABS
1.0000 | ORAL_TABLET | Freq: Every day | ORAL | 0 refills | Status: AC
Start: 2023-08-21 — End: ?
  Filled 2023-08-21: qty 30, 30d supply, fill #0

## 2023-08-21 NOTE — Telephone Encounter (Signed)
Okay to refill Megace per Dr. Drue Second.   Sandie Ano, RN

## 2023-08-22 ENCOUNTER — Telehealth: Payer: Self-pay

## 2023-08-22 ENCOUNTER — Other Ambulatory Visit: Payer: Self-pay | Admitting: Internal Medicine

## 2023-08-22 ENCOUNTER — Other Ambulatory Visit: Payer: Medicaid Other | Admitting: *Deleted

## 2023-08-22 DIAGNOSIS — F1190 Opioid use, unspecified, uncomplicated: Secondary | ICD-10-CM

## 2023-08-22 DIAGNOSIS — B20 Human immunodeficiency virus [HIV] disease: Secondary | ICD-10-CM | POA: Diagnosis not present

## 2023-08-22 MED ORDER — TRAMADOL HCL 50 MG PO TABS: 50.0000 mg | ORAL_TABLET | Freq: Four times a day (QID) | ORAL | 0 refills | Status: DC | PRN

## 2023-08-22 NOTE — Patient Instructions (Signed)
Visit Information  [name]  - as a part of your Medicaid benefit, you are eligible for care management and care coordination services at no cost or copay. I was unable to reach you by phone today but would be happy to help you with your health related needs. Please feel free to call me @ [phone].   A member of the Managed Medicaid care management team will reach out to you again over the next 7 days.   Estanislado Emms RN, BSN [place]  Value-Based Care Institute [facility] Health RN Care Coordinator [phone]

## 2023-08-22 NOTE — Patient Outreach (Signed)
  Medicaid Managed Care   Unsuccessful Attempt Note   08/22/2023 Name: Janice Williams MRN: [id number] DOB: [birth year]  Referred by: Carmina Miller, DO Reason for referral : High Risk Managed Medicaid (Unsuccessful RNCM initial telephone outreach)   An unsuccessful telephone outreach was attempted today. The patient was referred to the case management team for assistance with care management and care coordination.    Follow Up Plan: A HIPAA compliant phone message was left for the patient providing contact information and requesting a return call. and The Managed Medicaid care management team will reach out to the patient again over the next 7 days.    Estanislado Emms RN, BSN [place]  Value-Based Care Institute [facility] RN Care Coordinator [phone]

## 2023-08-22 NOTE — Telephone Encounter (Signed)
Patient called requesting refill of tramadol. Says it somehow got removed from her med list. Her PCP was refilling, but she says she has been trying to get this refilled through their office since last week. She is asking if Dr. Drue Second can send a refill.   traMADol (ULTRAM) 50 MG tablet Take 1 tablet (50 mg total) by mouth every 6 (six) hours as needed. 60 tablet 0   Last ordered by Dr. Daiva Eves, appears the prescription expired on 08/16/23.  Per Dr. Drue Second, okay to refill.   Sandie Ano, RN

## 2023-08-22 NOTE — Telephone Encounter (Signed)
Called Janice Williams and notified her that tramadol has been sent.   Sandie Ano, RN

## 2023-08-22 NOTE — Progress Notes (Signed)
Tramadol 50 mg q6hr prn #60 rf0

## 2023-08-23 DIAGNOSIS — B20 Human immunodeficiency virus [HIV] disease: Secondary | ICD-10-CM | POA: Diagnosis not present

## 2023-08-24 DIAGNOSIS — J301 Allergic rhinitis due to pollen: Secondary | ICD-10-CM | POA: Diagnosis not present

## 2023-08-24 DIAGNOSIS — J3089 Other allergic rhinitis: Secondary | ICD-10-CM | POA: Diagnosis not present

## 2023-08-24 DIAGNOSIS — Z7689 Persons encountering health services in other specified circumstances: Secondary | ICD-10-CM | POA: Diagnosis not present

## 2023-08-24 DIAGNOSIS — J3081 Allergic rhinitis due to animal (cat) (dog) hair and dander: Secondary | ICD-10-CM | POA: Diagnosis not present

## 2023-08-27 DIAGNOSIS — B20 Human immunodeficiency virus [HIV] disease: Secondary | ICD-10-CM | POA: Diagnosis not present

## 2023-08-28 DIAGNOSIS — B20 Human immunodeficiency virus [HIV] disease: Secondary | ICD-10-CM | POA: Diagnosis not present

## 2023-08-29 DIAGNOSIS — B20 Human immunodeficiency virus [HIV] disease: Secondary | ICD-10-CM | POA: Diagnosis not present

## 2023-08-30 ENCOUNTER — Telehealth: Payer: Self-pay | Admitting: Internal Medicine

## 2023-08-30 DIAGNOSIS — B20 Human immunodeficiency virus [HIV] disease: Secondary | ICD-10-CM | POA: Diagnosis not present

## 2023-08-30 MED ORDER — BUDESONIDE-FORMOTEROL FUMARATE 80-4.5 MCG/ACT IN AERO: 2.0000 | INHALATION_SPRAY | Freq: Two times a day (BID) | RESPIRATORY_TRACT | 6 refills | Status: DC

## 2023-08-30 NOTE — Telephone Encounter (Signed)
Patient [facility] [address].

## 2023-08-30 NOTE — Telephone Encounter (Signed)
Refill sent to the pharmacy for pt. Nothing further needed.

## 2023-08-31 DIAGNOSIS — B20 Human immunodeficiency virus [HIV] disease: Secondary | ICD-10-CM | POA: Diagnosis not present

## 2023-09-03 DIAGNOSIS — B20 Human immunodeficiency virus [HIV] disease: Secondary | ICD-10-CM | POA: Diagnosis not present

## 2023-09-04 DIAGNOSIS — B20 Human immunodeficiency virus [HIV] disease: Secondary | ICD-10-CM | POA: Diagnosis not present

## 2023-09-05 DIAGNOSIS — B20 Human immunodeficiency virus [HIV] disease: Secondary | ICD-10-CM | POA: Diagnosis not present

## 2023-09-06 ENCOUNTER — Encounter: Payer: Self-pay | Admitting: Internal Medicine

## 2023-09-06 ENCOUNTER — Other Ambulatory Visit: Payer: Self-pay

## 2023-09-06 ENCOUNTER — Ambulatory Visit (INDEPENDENT_AMBULATORY_CARE_PROVIDER_SITE_OTHER): Payer: Medicaid Other | Admitting: Internal Medicine

## 2023-09-06 ENCOUNTER — Other Ambulatory Visit: Payer: Medicaid Other | Admitting: *Deleted

## 2023-09-06 VITALS — BP 131/80 | HR 101 | Resp 16 | Ht 63.0 in | Wt 91.6 lb

## 2023-09-06 DIAGNOSIS — Z23 Encounter for immunization: Secondary | ICD-10-CM | POA: Diagnosis not present

## 2023-09-06 DIAGNOSIS — B20 Human immunodeficiency virus [HIV] disease: Secondary | ICD-10-CM

## 2023-09-06 DIAGNOSIS — Z7689 Persons encountering health services in other specified circumstances: Secondary | ICD-10-CM | POA: Diagnosis not present

## 2023-09-06 DIAGNOSIS — M1990 Unspecified osteoarthritis, unspecified site: Secondary | ICD-10-CM | POA: Diagnosis not present

## 2023-09-06 DIAGNOSIS — Z79899 Other long term (current) drug therapy: Secondary | ICD-10-CM | POA: Diagnosis not present

## 2023-09-06 DIAGNOSIS — K219 Gastro-esophageal reflux disease without esophagitis: Secondary | ICD-10-CM

## 2023-09-06 MED ORDER — MIRTAZAPINE 15 MG PO TABS: 15.0000 mg | ORAL_TABLET | Freq: Every day | ORAL | 3 refills | Status: DC

## 2023-09-06 MED ORDER — ROSUVASTATIN CALCIUM 20 MG PO TABS: 20.0000 mg | ORAL_TABLET | Freq: Every day | ORAL | 3 refills | Status: DC

## 2023-09-06 NOTE — Patient Outreach (Signed)
Medicaid Managed Care   Nurse Care Manager Note  09/06/2023 Name:  Janice Williams MRN:  [id number] DOB:  Feb 03, 1961  Janice Williams is an 62 y.o. year old female who is a primary patient of Carmina Miller, DO.  The [facility] Managed Care Coordination team was consulted for assistance with:    COPD  Ms. Berroa was given information about Medicaid Managed Care Coordination team services today. Janice Williams Patient agreed to services and verbal consent obtained.  Engaged with patient by telephone for initial visit in response to provider referral for case management and/or care coordination services.   Assessments/Interventions:  Review of past medical history, allergies, medications, health status, including review of consultants reports, laboratory and other test data, was performed as part of comprehensive evaluation and provision of chronic care management services.  SDOH (Social Determinants of Health) assessments and interventions performed: SDOH Interventions    Flowsheet Row Patient Outreach Telephone from 09/06/2023 in [place] POPULATION HE[facility] DEPARTMENT Patient Outreach Telephone from 08/18/2021 in [facility] Community Care Coordination  SDOH Interventions    Food Insecurity Interventions Patient Declined Intervention Not Indicated  Housing Interventions Intervention Not Indicated Intervention Not Indicated  Transportation Interventions Intervention Not Indicated Intervention Not Indicated  Utilities Interventions Intervention Not Indicated --       Care Plan  Allergies  Allergen Reactions   Chantix [Varenicline] Itching    Medications Reviewed Today     Reviewed by Heidi Dach, RN (Registered Nurse) on 09/06/23 at 1014  Med List Status: <None>   Medication Order Taking? Sig Documenting Provider Last Dose Status Informant  acyclovir (ZOVIRAX) 400 MG tablet [id number] No Take 1 tablet (400 mg total) by mouth 3 (three) times daily.  Patient not taking:  Reported on 09/06/2023   Carmina Miller, DO Not Taking Active   albuterol (VENTOLIN HFA) 108 (90 Base) MCG/ACT inhaler [id number] Yes Inhale 2 puffs into the lungs every 6 (six) hours as needed for wheezing or shortness of breath. Crissie Sickles, MD Taking Active   amLODipine (NORVASC) 10 MG tablet [id number] Yes Take 1 tablet (10 mg total) by mouth daily. Steffanie Rainwater, MD Taking Active   bictegravir-emtricitabine-tenofovir AF (BIKTARVY) 50-200-25 MG TABS tablet [id number] Yes Take 1 tablet by mouth daily. Judyann Munson, MD Taking Active   budesonide-formoterol [facility]) 80-4.5 MCG/ACT inhaler [id number] Yes Inhale 2 puffs into the lungs 2 (two) times daily. Nyoka Cowden, MD Taking Active   docusate sodium (COLACE) 100 MG capsule [id number] Yes Take 1 capsule (100 mg total) by mouth daily as needed. Lyndle Herrlich, MD Taking Active   Ensure [facility]) [id number] Yes Take 237 mLs by mouth 3 (three) times daily between meals. Judyann Munson, MD Taking Active   EPIPEN 2-PAK 0.3 MG/0.3ML SOAJ injection [id number] Yes  [provider] Taking Active   fluticasone (FLONASE) 50 MCG/ACT nasal spray [id number] Yes Use 1 spray(s) in each nostril once daily Marrianne Mood, MD Taking Active   megestrol (MEGACE) 40 MG tablet [id number] Yes Take 1 tablet (40 mg total) by mouth daily. Judyann Munson, MD Taking Active   mirtazapine (REMERON) 15 MG tablet [id number] Yes TAKE 1 TABLET BY MOUTH EVERYDAY AT BEDTIME Lovie Macadamia, MD Taking Active   neomycin-polymyxin-hydrocortisone (CORTISPORIN) 3.5-10000-1 OTIC suspension [id number] Yes Place 4 drops into both ears 3 (three) times daily. Cathlyn Parsons, NP Taking Active   omeprazole (PRILOSEC) 20 MG capsule [id number] Yes Take 1 capsule (20 mg total) by mouth daily. Masters, Psychiatric nurse, DO Taking Active  ondansetron (ZOFRAN) 4 MG tablet [id number] Yes Take 1 tablet (4 mg total) by mouth every 8 (eight) hours as needed for nausea or vomiting. Judyann Munson, MD Taking Active   PRESCRIPTION MEDICATION [id number]  Inject 1 Dose into the skin once a week. Allergy shot in office on Fridays [provider]  Active Self           Med Note Bonna Gains I   Mon Dec 12, 2021 10:20 PM) On Hold until 12/23/21.  RESTASIS 0.05 % ophthalmic emulsion [id number] Yes 1 drop 2 (two) times daily. [provider] Taking Active Self  rosuvastatin (CRESTOR) 20 MG tablet [id number] Yes Take 1 tablet (20 mg total) by mouth daily. Marrianne Mood, MD Taking Active   tiotropium [facility] HANDIHALER) 18 MCG inhalation capsule [id number]  Place 1 capsule (18 mcg total) into inhaler and inhale daily. Crissie Sickles, MD  Expired 05/29/23 2359   traMADol (ULTRAM) 50 MG tablet [id number] Yes Take 1 tablet (50 mg total) by mouth every 6 (six) hours as needed. Raymondo Band, MD Taking Active   triamcinolone ointment (KENALOG) 0.1 % [id number] Yes Apply 1 Application topically 2 (two) times daily. [provider] Taking Active             Patient Active Problem List   Diagnosis Date Noted   Vitamin D deficiency 08/08/2023   Viral URI with cough 05/24/2023   Epidermal inclusion cyst 12/22/2022   Rash of hand 12/21/2022   Former cigarette smoker 11/16/2022   Ankle swelling, left 06/28/2022   Sprain of calcaneofibular ligament 02/28/2022   Need for shingles vaccine 08/14/2021   Constipation 05/12/2021   Cough 03/30/2021   Screening for cervical cancer 03/18/2021   Ear pain, bilateral 03/17/2021   Degenerative disc disease, cervical 10/13/2020   Chronic, continuous use of opioids 09/10/2020   Rhinitis, chronic 06/10/2020   Restless legs syndrome (RLS) 01/08/2020   Osteoporosis 10/10/2019   COPD  GOLD 3  07/08/2019   Moderate protein-calorie malnutrition (HCC) 05/31/2018   Hypertension 06/08/2017   Left knee pain 03/15/2015   Healthcare maintenance 07/06/2011   Hyperlipidemia 06/03/2009   WEIGHT LOSS 06/03/2009   Human  immunodeficiency virus (HIV) disease (HCC) 10/29/2008   GERD 10/29/2008   CEREBROVASCULAR ACCIDENT, HX OF 10/29/2008   TOBACCO DEPENDENCE 02/14/2007   Allergic rhinitis 02/14/2007   Symptoms concerning nutrition, metabolism, and development 02/14/2007    Conditions to be addressed/monitored per PCP order:  COPD  Care Plan : RN Care Manager Plan of Care  Updates made by Heidi Dach, RN since 09/06/2023 12:00 AM     Problem: Health Management needs related to COPD      Long-Range Goal: Development of Plan of Care to address Health Management needs related to COPD   Start Date: 09/06/2023  Expected End Date: 12/05/2023  Note:   Current Barriers:  Chronic Disease Management support and education needs related to COPD  RNCM Clinical Goal(s):  Patient will verbalize understanding of plan for management of COPD as evidenced by Patient reports attend all scheduled medical appointments: 09/06/23 with [facility] and 11/12/23 with PCP as evidenced by provider documentation in EMR        continue to work with RN Care Manager and/or Social Worker to address care management and care coordination needs related to COPD as evidenced by adherence to CM Team Scheduled appointments     through collaboration with Medical illustrator, provider, and care team.   Interventions: Evaluation of current treatment plan  related to  self management and patient's adherence to plan as established by provider   COPD: (Status: New goal.) Long Term Goal  Reviewed medications with patient, including use of prescribed maintenance and rescue inhalers, and provided instruction on medication management and the importance of adherence Provided patient with basic written and verbal COPD education on self care/management/and exacerbation prevention Advised patient to track and manage COPD triggers Advised patient to self assesses COPD action plan zone and make appointment with provider if in the yellow zone for 48 hours without  improvement Provided education about and advised patient to utilize infection prevention strategies to reduce risk of respiratory infection Discussed the importance of adequate rest and management of fatigue with COPD Assessed social determinant of health barriers Advised patient to discuss timing for Covid vaccine, flu vaccine and pneumococcal vaccine with provider Advised patient to request 90 day refills for all appropriate medications Advised patient to contact [facility] Member Services [phone] for member benefits  Patient Goals/Self-Care Activities: Attend all scheduled provider appointments Call provider office for new concerns or questions  avoid second hand smoke limit outdoor activity during cold weather listen for public air quality announcements every day eliminate symptom triggers at home get at least 7 to 8 hours of sleep at night       Follow Up:  Patient agrees to Care Plan and Follow-up.  Plan: The Managed Medicaid care management team will reach out to the patient again over the next 30 days.  Date/time of next scheduled RN care management/care coordination outreach:  10/10/23 at 10:30am  Estanislado Emms RN, BSN [place]  Value-Based Care Institute [facility] Health RN Care Coordinator [phone]

## 2023-09-06 NOTE — Progress Notes (Signed)
Patient ID: Janice Williams, female   DOB: 09-Aug-1961, 62 y.o.   MRN: [id number]  HPI Janice Williams is a 62yo F with well controlled hiv disease, she reports Not missing doses with biktarvy. Excellent adherence She did have covid-19 in June, but still felt poorly for awhile -- church exposure - outbreak at church Outpatient Encounter Medications as of 09/06/2023  Medication Sig   albuterol (VENTOLIN HFA) 108 (90 Base) MCG/ACT inhaler Inhale 2 puffs into the lungs every 6 (six) hours as needed for wheezing or shortness of breath.   amLODipine (NORVASC) 10 MG tablet Take 1 tablet (10 mg total) by mouth daily.   bictegravir-emtricitabine-tenofovir AF (BIKTARVY) 50-200-25 MG TABS tablet Take 1 tablet by mouth daily.   budesonide-formoterol (SYMBICORT) 80-4.5 MCG/ACT inhaler Inhale 2 puffs into the lungs 2 (two) times daily.   docusate sodium (COLACE) 100 MG capsule Take 1 capsule (100 mg total) by mouth daily as needed.   Ensure (ENSURE) Take 237 mLs by mouth 3 (three) times daily between meals.   EPIPEN 2-PAK 0.3 MG/0.3ML SOAJ injection    fluticasone (FLONASE) 50 MCG/ACT nasal spray Use 1 spray(s) in each nostril once daily   megestrol (MEGACE) 40 MG tablet Take 1 tablet (40 mg total) by mouth daily.   mirtazapine (REMERON) 15 MG tablet TAKE 1 TABLET BY MOUTH EVERYDAY AT BEDTIME   neomycin-polymyxin-hydrocortisone (CORTISPORIN) 3.5-10000-1 OTIC suspension Place 4 drops into both ears 3 (three) times daily.   omeprazole (PRILOSEC) 20 MG capsule Take 1 capsule (20 mg total) by mouth daily.   ondansetron (ZOFRAN) 4 MG tablet Take 1 tablet (4 mg total) by mouth every 8 (eight) hours as needed for nausea or vomiting.   PRESCRIPTION MEDICATION Inject 1 Dose into the skin once a week. Allergy shot in office on Fridays   RESTASIS 0.05 % ophthalmic emulsion 1 drop 2 (two) times daily.   rosuvastatin (CRESTOR) 20 MG tablet Take 1 tablet (20 mg total) by mouth daily.   traMADol (ULTRAM) 50 MG tablet Take 1  tablet (50 mg total) by mouth every 6 (six) hours as needed.   triamcinolone ointment (KENALOG) 0.1 % Apply 1 Application topically 2 (two) times daily.   acyclovir (ZOVIRAX) 400 MG tablet Take 1 tablet (400 mg total) by mouth 3 (three) times daily. (Patient not taking: Reported on 09/06/2023)   tiotropium (SPIRIVA HANDIHALER) 18 MCG inhalation capsule Place 1 capsule (18 mcg total) into inhaler and inhale daily.   No facility-administered encounter medications on file as of 09/06/2023.     Patient Active Problem List   Diagnosis Date Noted   Vitamin D deficiency 08/08/2023   Viral URI with cough 05/24/2023   Epidermal inclusion cyst 12/22/2022   Rash of hand 12/21/2022   Former cigarette smoker 11/16/2022   Ankle swelling, left 06/28/2022   Sprain of calcaneofibular ligament 02/28/2022   Need for shingles vaccine 08/14/2021   Constipation 05/12/2021   Cough 03/30/2021   Screening for cervical cancer 03/18/2021   Ear pain, bilateral 03/17/2021   Degenerative disc disease, cervical 10/13/2020   Chronic, continuous use of opioids 09/10/2020   Rhinitis, chronic 06/10/2020   Restless legs syndrome (RLS) 01/08/2020   Osteoporosis 10/10/2019   COPD  GOLD 3  07/08/2019   Moderate protein-calorie malnutrition (HCC) 05/31/2018   Hypertension 06/08/2017   Left knee pain 03/15/2015   Healthcare maintenance 07/06/2011   Hyperlipidemia 06/03/2009   WEIGHT LOSS 06/03/2009   Human immunodeficiency virus (HIV) disease (HCC) 10/29/2008   GERD 10/29/2008  CEREBROVASCULAR ACCIDENT, HX OF 10/29/2008   TOBACCO DEPENDENCE 02/14/2007   Allergic rhinitis 02/14/2007   Symptoms concerning nutrition, metabolism, and development 02/14/2007     Health Maintenance Due  Topic Date Due   Zoster Vaccines- Shingrix (1 of 2) Never done   INFLUENZA VACCINE  07/19/2023   COVID-19 Vaccine (4 - 2023-24 season) 08/19/2023     Review of Systems  Constitutional: Negative for fever, chills, diaphoresis,  activity change, appetite change, fatigue and unexpected weight change.  HENT: Negative for congestion, sore throat, rhinorrhea, sneezing, trouble swallowing and sinus pressure.  Eyes: Negative for photophobia and visual disturbance.  Respiratory: Negative for cough, chest tightness, shortness of breath, wheezing and stridor.  Cardiovascular: Negative for chest pain, palpitations and leg swelling.  Gastrointestinal: Negative for nausea, vomiting, abdominal pain, diarrhea, constipation, blood in stool, abdominal distention and anal bleeding.  Genitourinary: Negative for dysuria, hematuria, flank pain and difficulty urinating.  Musculoskeletal: Negative for myalgias, back pain, joint swelling, arthralgias and gait problem.  Skin: Negative for color change, pallor, rash and wound.  Neurological: Negative for dizziness, tremors, weakness and light-headedness.  Hematological: Negative for adenopathy. Does not bruise/bleed easily.  Psychiatric/Behavioral: Negative for behavioral problems, confusion, sleep disturbance, dysphoric mood, decreased concentration and agitation.   Physical Exam   BP 131/80   Pulse (!) 101   Resp 16   Ht 5\' 3"  (1.6 m)   Wt 91 lb 9.6 oz (41.5 kg)   BMI 16.23 kg/m   Physical Exam  Constitutional:  oriented to person, place, and time. appears well-developed and well-nourished. No distress.  HENT: [place]/AT, PERRLA, no scleral icterus Mouth/Throat: Oropharynx is clear and moist. No oropharyngeal exudate.  Cardiovascular: Normal rate, regular rhythm and normal heart sounds. Exam reveals no gallop and no friction rub.  No murmur heard.  Pulmonary/Chest: Effort normal and breath sounds normal. No respiratory distress.  has no wheezes.  Neck = supple, no nuchal rigidity Abdominal: Soft. Bowel sounds are normal.  exhibits no distension. There is no tenderness.  Lymphadenopathy: no cervical adenopathy. No axillary adenopathy Neurological: alert and oriented to person, place, and  time.  Skin: Skin is warm and dry. No rash noted. No erythema.  Psychiatric: a normal mood and affect.  behavior is normal.   Lab Results  Component Value Date   CD4TCELL 43 01/15/2023   Lab Results  Component Value Date   CD4TABS 674 01/15/2023   CD4TABS 899 08/01/2021   CD4TABS 682 03/07/2021   Lab Results  Component Value Date   HIV1RNAQUANT <20 (H) 01/15/2023   Lab Results  Component Value Date   HEPBSAB NEG 12/28/2014   Lab Results  Component Value Date   LABRPR NON-REACTIVE 01/15/2023    CBC Lab Results  Component Value Date   WBC 4.1 01/15/2023   RBC 3.86 01/15/2023   HGB 12.4 01/15/2023   HCT 36.6 01/15/2023   PLT 277 01/15/2023   MCV 94.8 01/15/2023   MCH 32.1 01/15/2023   MCHC 33.9 01/15/2023   RDW 13.1 01/15/2023   LYMPHSABS 1,710 01/15/2023   MONOABS 0.6 06/28/2022   EOSABS 29 01/15/2023    BMET Lab Results  Component Value Date   NA 140 01/15/2023   K 4.1 01/15/2023   CL 102 01/15/2023   CO2 28 01/15/2023   GLUCOSE 112 (H) 01/15/2023   BUN 14 01/15/2023   CREATININE 0.93 01/15/2023   CALCIUM 9.5 01/15/2023   GFRNONAA 52 (L) 06/28/2022   GFRAA 65 03/07/2021      Assessment  and Plan  Hiv disease = will get labs, and refills of biktarvy  Long term medication management = will check cr  Health maintenance = flu today and will come back towards end of October with moderna  Chronic pain for arthritis = tramadol has been helpful. Will continue to prescribe  On allergy shots tomorrow  Covid = recovered from June but will get booster next month

## 2023-09-06 NOTE — Patient Instructions (Signed)
Visit Information  Janice Williams was given information about Medicaid Managed Care team care coordination services as a part of their [facility] Medicaid benefit. Janice Williams verbally consented to engagement with the [facility] Managed Care team.   If you are experiencing a medical emergency, please call 911 or report to your local emergency department or urgent care.   If you have a non-emergency medical problem during routine business hours, please contact your provider's office and ask to speak with a nurse.   For questions related to your [facility] health plan, please call: [phone] or go here:[email or web address]  If you would like to schedule transportation through your [facility] plan, please call the following number at least 2 days in advance of your appointment: [phone].   You can also use the MTM portal or MTM mobile app to manage your rides. Reimbursement for transportation is available through [facility]! For the portal, please go to [email or web address]  Call the [facility] Crisis Line at [phone], at any time, 24 hours a day, 7 days a week. If you are in danger or need immediate medical attention call 911.  If you would like help to quit smoking, call 1-800-QUIT-NOW (614-832-0815) OR Espaol: 1-855-Djelo-Ya (9-563-875-6433) o para ms informacin haga clic aqu or Text READY to 295-188 to register via text  [name],   Please see education materials related to COPD and Covid 19 provided by MyChart link.  Patient verbalizes understanding of instructions and care plan provided today and agrees to view in MyChart. Active MyChart status and patient understanding of how to access instructions and care plan via MyChart confirmed with patient.     Telephone follow up appointment with Managed Medicaid care management team member scheduled for:10/10/23 at 10:30am  Janice Emms RN, BSN [place]  Value-Based Care Institute [facility] Health RN  Care Coordinator [phone]   Following is a copy of your plan of care:  Care Plan : RN Care Manager Plan of Care  Updates made by Heidi Dach, RN since 09/06/2023 12:00 AM     Problem: Health Management needs related to COPD      Long-Range Goal: Development of Plan of Care to address Health Management needs related to COPD   Start Date: 09/06/2023  Expected End Date: 12/05/2023  Note:   Current Barriers:  Chronic Disease Management support and education needs related to COPD  RNCM Clinical Goal(s):  Patient will verbalize understanding of plan for management of COPD as evidenced by Patient reports attend all scheduled medical appointments: 09/06/23 with [facility] and 11/12/23 with PCP as evidenced by provider documentation in EMR        continue to work with RN Care Manager and/or Social Worker to address care management and care coordination needs related to COPD as evidenced by adherence to CM Team Scheduled appointments     through collaboration with Medical illustrator, provider, and care team.   Interventions: Evaluation of current treatment plan related to  self management and patient's adherence to plan as established by provider   COPD: (Status: New goal.) Long Term Goal  Reviewed medications with patient, including use of prescribed maintenance and rescue inhalers, and provided instruction on medication management and the importance of adherence Provided patient with basic written and verbal COPD education on self care/management/and exacerbation prevention Advised patient to track and manage COPD triggers Advised patient to self assesses COPD action plan zone and make appointment with provider if in the yellow zone for 48 hours without improvement Provided education  about and advised patient to utilize infection prevention strategies to reduce risk of respiratory infection Discussed the importance of adequate rest and management of fatigue with COPD Assessed social  determinant of health barriers Advised patient to discuss timing for Covid vaccine, flu vaccine and pneumococcal vaccine with provider Advised patient to request 90 day refills for all appropriate medications Advised patient to contact [facility] Member Services [phone] for member benefits  Patient Goals/Self-Care Activities: Attend all scheduled provider appointments Call provider office for new concerns or questions  avoid second hand smoke limit outdoor activity during cold weather listen for public air quality announcements every day eliminate symptom triggers at home get at least 7 to 8 hours of sleep at night

## 2023-09-07 DIAGNOSIS — Z7689 Persons encountering health services in other specified circumstances: Secondary | ICD-10-CM | POA: Diagnosis not present

## 2023-09-07 DIAGNOSIS — J3089 Other allergic rhinitis: Secondary | ICD-10-CM | POA: Diagnosis not present

## 2023-09-07 DIAGNOSIS — J301 Allergic rhinitis due to pollen: Secondary | ICD-10-CM | POA: Diagnosis not present

## 2023-09-07 DIAGNOSIS — J3081 Allergic rhinitis due to animal (cat) (dog) hair and dander: Secondary | ICD-10-CM | POA: Diagnosis not present

## 2023-09-07 LAB — T-HELPER CELL (CD4) - (RCID CLINIC ONLY)
CD4 % Helper T Cell: 45 % (ref 33–65)
CD4 T Cell Abs: 880 /uL (ref 400–1790)

## 2023-09-08 LAB — COMPLETE METABOLIC PANEL WITH GFR
AG Ratio: 1.5 (calc) (ref 1.0–2.5)
ALT: 7 U/L (ref 6–29)
AST: 16 U/L (ref 10–35)
Albumin: 4.7 g/dL (ref 3.6–5.1)
Alkaline phosphatase (APISO): 53 U/L (ref 37–153)
BUN: 16 mg/dL (ref 7–25)
CO2: 29 mmol/L (ref 20–32)
Calcium: 10.3 mg/dL (ref 8.6–10.4)
Chloride: 102 mmol/L (ref 98–110)
Creat: 1.02 mg/dL (ref 0.50–1.05)
Globulin: 3.2 g/dL (calc) (ref 1.9–3.7)
Glucose, Bld: 87 mg/dL (ref 65–99)
Potassium: 4.6 mmol/L (ref 3.5–5.3)
Sodium: 139 mmol/L (ref 135–146)
Total Bilirubin: 0.7 mg/dL (ref 0.2–1.2)
Total Protein: 7.9 g/dL (ref 6.1–8.1)
eGFR: 62 mL/min/{1.73_m2} (ref >=60)

## 2023-09-08 LAB — LIPID PANEL
Cholesterol: 195 mg/dL (ref <200)
HDL: 91 mg/dL (ref >=50)
LDL Cholesterol (Calc): 86 mg/dL (calc)
Non-HDL Cholesterol (Calc): 104 mg/dL (calc) (ref <130)
Total CHOL/HDL Ratio: 2.1 (calc) (ref <5.0)
Triglycerides: 85 mg/dL (ref <150)

## 2023-09-08 LAB — CBC WITH DIFFERENTIAL/PLATELET
Absolute Monocytes: 479 cells/uL (ref 200–950)
Basophils Absolute: 23 cells/uL (ref 0–200)
Basophils Relative: 0.4 %
Eosinophils Absolute: 51 cells/uL (ref 15–500)
Eosinophils Relative: 0.9 %
HCT: 38.1 % (ref 35.0–45.0)
Hemoglobin: 12.7 g/dL (ref 11.7–15.5)
Lymphs Abs: 2314 cells/uL (ref 850–3900)
MCH: 32.8 pg (ref 27.0–33.0)
MCHC: 33.3 g/dL (ref 32.0–36.0)
MCV: 98.4 fL (ref 80.0–100.0)
MPV: 9.8 fL (ref 7.5–12.5)
Monocytes Relative: 8.4 %
Neutro Abs: 2833 cells/uL (ref 1500–7800)
Neutrophils Relative %: 49.7 %
Platelets: 335 10*3/uL (ref 140–400)
RBC: 3.87 10*6/uL (ref 3.80–5.10)
RDW: 12.7 % (ref 11.0–15.0)
Total Lymphocyte: 40.6 %
WBC: 5.7 10*3/uL (ref 3.8–10.8)

## 2023-09-08 LAB — HIV-1 RNA QUANT-NO REFLEX-BLD
HIV 1 RNA Quant: <20 Copies/mL — ABNORMAL HIGH
HIV-1 RNA Quant, Log: <1.3 Log cps/mL — ABNORMAL HIGH

## 2023-09-08 LAB — RPR: RPR Ser Ql: NONREACTIVE

## 2023-09-10 DIAGNOSIS — B20 Human immunodeficiency virus [HIV] disease: Secondary | ICD-10-CM | POA: Diagnosis not present

## 2023-09-11 ENCOUNTER — Other Ambulatory Visit: Payer: Self-pay

## 2023-09-11 DIAGNOSIS — B20 Human immunodeficiency virus [HIV] disease: Secondary | ICD-10-CM | POA: Diagnosis not present

## 2023-09-12 DIAGNOSIS — B20 Human immunodeficiency virus [HIV] disease: Secondary | ICD-10-CM | POA: Diagnosis not present

## 2023-09-13 DIAGNOSIS — B20 Human immunodeficiency virus [HIV] disease: Secondary | ICD-10-CM | POA: Diagnosis not present

## 2023-09-17 ENCOUNTER — Other Ambulatory Visit: Payer: Self-pay

## 2023-09-17 ENCOUNTER — Other Ambulatory Visit: Payer: Self-pay | Admitting: Internal Medicine

## 2023-09-17 DIAGNOSIS — B20 Human immunodeficiency virus [HIV] disease: Secondary | ICD-10-CM | POA: Diagnosis not present

## 2023-09-17 MED ORDER — BIKTARVY 50-200-25 MG PO TABS
1.0000 | ORAL_TABLET | Freq: Every day | ORAL | 5 refills | Status: DC
Start: 2023-09-17 — End: 2024-02-27
  Filled 2023-09-17: qty 30, 30d supply, fill #0
  Filled 2023-10-09: qty 30, 30d supply, fill #1
  Filled 2023-11-07: qty 30, 30d supply, fill #2
  Filled 2023-12-04: qty 30, 30d supply, fill #3
  Filled 2023-12-27: qty 30, 30d supply, fill #4
  Filled 2024-01-31: qty 30, 30d supply, fill #5

## 2023-09-17 NOTE — Progress Notes (Signed)
Specialty Pharmacy Refill Coordination Note  Janice Williams is a 62 y.o. female contacted today regarding refills of specialty medication(s) Bictegravir-Emtricitab-Tenofov .  Patient requested Delivery  on 09/18/23  to verified address [address] [place] [address]   Medication will be filled on tomorrow.

## 2023-09-18 DIAGNOSIS — Z419 Encounter for procedure for purposes other than remedying health state, unspecified: Secondary | ICD-10-CM | POA: Diagnosis not present

## 2023-09-18 DIAGNOSIS — B20 Human immunodeficiency virus [HIV] disease: Secondary | ICD-10-CM | POA: Diagnosis not present

## 2023-09-19 DIAGNOSIS — B20 Human immunodeficiency virus [HIV] disease: Secondary | ICD-10-CM | POA: Diagnosis not present

## 2023-09-20 DIAGNOSIS — B20 Human immunodeficiency virus [HIV] disease: Secondary | ICD-10-CM | POA: Diagnosis not present

## 2023-09-21 ENCOUNTER — Telehealth: Payer: Self-pay

## 2023-09-21 ENCOUNTER — Other Ambulatory Visit: Payer: Self-pay

## 2023-09-21 ENCOUNTER — Other Ambulatory Visit: Payer: Self-pay | Admitting: Internal Medicine

## 2023-09-21 DIAGNOSIS — Z7689 Persons encountering health services in other specified circumstances: Secondary | ICD-10-CM | POA: Diagnosis not present

## 2023-09-21 DIAGNOSIS — J3089 Other allergic rhinitis: Secondary | ICD-10-CM | POA: Diagnosis not present

## 2023-09-21 DIAGNOSIS — J3081 Allergic rhinitis due to animal (cat) (dog) hair and dander: Secondary | ICD-10-CM | POA: Diagnosis not present

## 2023-09-21 DIAGNOSIS — J301 Allergic rhinitis due to pollen: Secondary | ICD-10-CM | POA: Diagnosis not present

## 2023-09-21 MED ORDER — TRAMADOL HCL 50 MG PO TABS: 50.0000 mg | ORAL_TABLET | Freq: Four times a day (QID) | ORAL | 0 refills | Status: DC | PRN

## 2023-09-21 NOTE — Telephone Encounter (Signed)
Per Dr. Drue Second ok to give verbal Tramadol refill.  Verbal given to [facility] pharmacy. Mikka Kissner T Pricilla Loveless

## 2023-09-21 NOTE — Addendum Note (Signed)
Addended by: Tressa Busman T on: 09/21/2023 12:30 PM   Modules accepted: Orders

## 2023-09-24 DIAGNOSIS — B20 Human immunodeficiency virus [HIV] disease: Secondary | ICD-10-CM | POA: Diagnosis not present

## 2023-09-25 DIAGNOSIS — B20 Human immunodeficiency virus [HIV] disease: Secondary | ICD-10-CM | POA: Diagnosis not present

## 2023-09-26 ENCOUNTER — Other Ambulatory Visit (HOSPITAL_COMMUNITY): Payer: Self-pay

## 2023-09-26 DIAGNOSIS — B20 Human immunodeficiency virus [HIV] disease: Secondary | ICD-10-CM | POA: Diagnosis not present

## 2023-09-27 DIAGNOSIS — B20 Human immunodeficiency virus [HIV] disease: Secondary | ICD-10-CM | POA: Diagnosis not present

## 2023-09-28 DIAGNOSIS — J3081 Allergic rhinitis due to animal (cat) (dog) hair and dander: Secondary | ICD-10-CM | POA: Diagnosis not present

## 2023-09-28 DIAGNOSIS — Z7689 Persons encountering health services in other specified circumstances: Secondary | ICD-10-CM | POA: Diagnosis not present

## 2023-09-28 DIAGNOSIS — J301 Allergic rhinitis due to pollen: Secondary | ICD-10-CM | POA: Diagnosis not present

## 2023-09-28 DIAGNOSIS — J3089 Other allergic rhinitis: Secondary | ICD-10-CM | POA: Diagnosis not present

## 2023-10-01 DIAGNOSIS — B20 Human immunodeficiency virus [HIV] disease: Secondary | ICD-10-CM | POA: Diagnosis not present

## 2023-10-02 DIAGNOSIS — B20 Human immunodeficiency virus [HIV] disease: Secondary | ICD-10-CM | POA: Diagnosis not present

## 2023-10-03 DIAGNOSIS — B20 Human immunodeficiency virus [HIV] disease: Secondary | ICD-10-CM | POA: Diagnosis not present

## 2023-10-04 ENCOUNTER — Other Ambulatory Visit (HOSPITAL_COMMUNITY): Payer: Self-pay

## 2023-10-04 DIAGNOSIS — B20 Human immunodeficiency virus [HIV] disease: Secondary | ICD-10-CM | POA: Diagnosis not present

## 2023-10-05 DIAGNOSIS — J3089 Other allergic rhinitis: Secondary | ICD-10-CM | POA: Diagnosis not present

## 2023-10-05 DIAGNOSIS — Z7689 Persons encountering health services in other specified circumstances: Secondary | ICD-10-CM | POA: Diagnosis not present

## 2023-10-05 DIAGNOSIS — J301 Allergic rhinitis due to pollen: Secondary | ICD-10-CM | POA: Diagnosis not present

## 2023-10-05 DIAGNOSIS — J3081 Allergic rhinitis due to animal (cat) (dog) hair and dander: Secondary | ICD-10-CM | POA: Diagnosis not present

## 2023-10-08 DIAGNOSIS — B20 Human immunodeficiency virus [HIV] disease: Secondary | ICD-10-CM | POA: Diagnosis not present

## 2023-10-09 ENCOUNTER — Other Ambulatory Visit: Payer: Self-pay

## 2023-10-09 DIAGNOSIS — B20 Human immunodeficiency virus [HIV] disease: Secondary | ICD-10-CM | POA: Diagnosis not present

## 2023-10-09 NOTE — Progress Notes (Signed)
Specialty Pharmacy Refill Coordination Note  Janice Williams is a 62 y.o. female contacted today regarding refills of specialty medication(s) Bictegravir-Emtricitab-Tenofov   Patient requested Delivery   Delivery date: 10/16/23   Verified address: [address]   [place]  [id number]   Medication will be filled on 10/15/23.

## 2023-10-10 ENCOUNTER — Other Ambulatory Visit: Payer: Self-pay | Admitting: *Deleted

## 2023-10-10 DIAGNOSIS — B20 Human immunodeficiency virus [HIV] disease: Secondary | ICD-10-CM | POA: Diagnosis not present

## 2023-10-10 NOTE — Patient Outreach (Signed)
Care Coordination  10/10/2023  Janice Williams 06/06/1961 [id number]   Successful telephone outreach with patient today. However, [name] was in an interview with her landlord for re certification and request to reschedule. A new telephone outreach was scheduled on 10/12/23 at 3:15pm. Patient agreed to new appointment.   Estanislado Emms RN, BSN [place]  Value-Based Care Institute [facility] Health RN Care Coordinator [phone]

## 2023-10-11 ENCOUNTER — Encounter: Payer: Self-pay | Admitting: Infectious Diseases

## 2023-10-11 ENCOUNTER — Other Ambulatory Visit: Payer: Self-pay

## 2023-10-11 ENCOUNTER — Other Ambulatory Visit (HOSPITAL_COMMUNITY)
Admission: RE | Admit: 2023-10-11 | Discharge: 2023-10-11 | Disposition: A | Discharge status: Home / Self Care | Payer: Medicaid Other | Source: Ambulatory Visit | Attending: Infectious Diseases | Admitting: Infectious Diseases

## 2023-10-11 ENCOUNTER — Ambulatory Visit: Payer: Medicaid Other | Admitting: Infectious Diseases

## 2023-10-11 VITALS — BP 107/70 | HR 85 | Resp 16 | Ht 63.0 in | Wt 99.8 lb

## 2023-10-11 DIAGNOSIS — Z124 Encounter for screening for malignant neoplasm of cervix: Secondary | ICD-10-CM

## 2023-10-11 DIAGNOSIS — Z7689 Persons encountering health services in other specified circumstances: Secondary | ICD-10-CM | POA: Diagnosis not present

## 2023-10-11 DIAGNOSIS — B20 Human immunodeficiency virus [HIV] disease: Secondary | ICD-10-CM | POA: Diagnosis not present

## 2023-10-11 NOTE — Assessment & Plan Note (Signed)
Normal pelvic exam. Small thin white discharge, vaginal atrophy on exam.   Cervical brushing collected for cytology and HPV.  She is a reasonable candidate to defer further screenings with no history of abnormal pap smears, no sexual partners. Will investigate and provide recommendations when results available.  Results will be communicated to the patient via telephone call.

## 2023-10-11 NOTE — Progress Notes (Signed)
Subjective :    Janice Williams is a 62 y.o. female here for an annual pelvic exam and pap smear.   Review of Systems: Current GYN complaints or concerns: none. Disappointed we don't have covid vaccines in stock today.   Patient denies any abdominal/pelvic pain, problems with bowel movements, urination, vaginal discharge or intercourse.   Past Medical History:  Diagnosis Date   Anemia    Arthritis    generalized   Blood transfusion without reported diagnosis 2003   Bronchitis    Cataract    COPD (chronic obstructive pulmonary disease) (HCC)    COPD with acute exacerbation (HCC) 06/09/2017   Dental abscess 03/15/2015   DYSPEPSIA 02/14/2007   Qualifier: Diagnosis of  By: Knox Royalty     EXTERNAL OTITIS 01/06/2010   Qualifier: Diagnosis of  By: Philipp Deputy MD, Tresa Endo     FACIAL RASH 02/04/2009   Qualifier: Diagnosis of  By: Michelle Nasuti     Former smoker    quit 2014   GERD (gastroesophageal reflux disease)    Glaucoma    HIV infection (HCC)    Hypertension    Osteoporosis    Seasonal allergies    Stroke (HCC) 2003   Substance abuse (HCC)    history, clean 7 years   SVD (spontaneous vaginal delivery)    x 3    Gynecologic History: [id number]  No LMP recorded. Patient is postmenopausal. Contraception: post menopausal  Last Pap: April 2022. Results were: normal Last Mammogram: April 2024. Results were: normal    Objective :   Physical Exam - chaperone present  Constitutional: Well developed, well nourished, no acute distress. She is alert and oriented x3.  Pelvic: External genitalia is normal in appearance. The vagina is normal in appearance. The cervix is bulbous and easily visualized. No CMT, normal expected cervical mucus present. Bimanual exam reveals uterus that is felt to be normal size, shape, and contour. No adnexal masses or tenderness noted. Breasts: symmetrical in contour, shape and texture. No palpable masses/nodules. No nipple discharge.  Psych: She  has a normal mood and affect.      Assessment & Plan:    Patient Active Problem List   Diagnosis Date Noted   Vitamin D deficiency 08/08/2023   Viral URI with cough 05/24/2023   Epidermal inclusion cyst 12/22/2022   Rash of hand 12/21/2022   Former cigarette smoker 11/16/2022   Ankle swelling, left 06/28/2022   Sprain of calcaneofibular ligament 02/28/2022   Need for shingles vaccine 08/14/2021   Constipation 05/12/2021   Cough 03/30/2021   Screening for cervical cancer 03/18/2021   Ear pain, bilateral 03/17/2021   Degenerative disc disease, cervical 10/13/2020   Chronic, continuous use of opioids 09/10/2020   Rhinitis, chronic 06/10/2020   Restless legs syndrome (RLS) 01/08/2020   Osteoporosis 10/10/2019   COPD  GOLD 3  07/08/2019   Moderate protein-calorie malnutrition (HCC) 05/31/2018   Hypertension 06/08/2017   Left knee pain 03/15/2015   Encounter for Papanicolaou smear for cervical cancer screening 07/06/2011   Hyperlipidemia 06/03/2009   WEIGHT LOSS 06/03/2009   Human immunodeficiency virus (HIV) disease (HCC) 10/29/2008   GERD 10/29/2008   CEREBROVASCULAR ACCIDENT, HX OF 10/29/2008   TOBACCO DEPENDENCE 02/14/2007   Allergic rhinitis 02/14/2007   Symptoms concerning nutrition, metabolism, and development 02/14/2007    Problem List Items Addressed This Visit       Unprioritized   Screening for cervical cancer    Normal pelvic exam. Small thin  white discharge, vaginal atrophy on exam.   Cervical brushing collected for cytology and HPV.  She is a reasonable candidate to defer further screenings with no history of abnormal pap smears, no sexual partners. Will investigate and provide recommendations when results available.  Results will be communicated to the patient via telephone call.        Encounter for Papanicolaou smear for cervical cancer screening - Primary   Relevant Orders   Cytology - PAP( [place])      Rexene Alberts, MSN, NP-C [facility] Office: [phone] Pager: [phone]  10/11/23 12:09 PM

## 2023-10-12 ENCOUNTER — Other Ambulatory Visit: Payer: Self-pay | Admitting: *Deleted

## 2023-10-12 NOTE — Patient Instructions (Signed)
Visit Information  Janice Williams was given information about Medicaid Managed Care team care coordination services as a part of their [facility] Medicaid benefit. [name] verbally consented to engagement with the [facility] Managed Care team.   If you are experiencing a medical emergency, please call 911 or report to your local emergency department or urgent care.   If you have a non-emergency medical problem during routine business hours, please contact your provider's office and ask to speak with a nurse.   For questions related to your [facility] health plan, please call: [phone] or go here:[email or web address]  If you would like to schedule transportation through your [facility] plan, please call the following number at least 2 days in advance of your appointment: [phone].   You can also use the MTM portal or MTM mobile app to manage your rides. Reimbursement for transportation is available through [facility]! For the portal, please go to [email or web address]  Call the [facility] Crisis Line at [phone], at any time, 24 hours a day, 7 days a week. If you are in danger or need immediate medical attention call 911.  If you would like help to quit smoking, call 1-800-QUIT-NOW (743-043-1811) OR Espaol: 1-855-Djelo-Ya (3-810-175-1025) o para ms informacin haga clic aqu or Text READY to 852-778 to register via text  Janice Williams,   Please see education materials related to COPD provided by MyChart link.  Patient verbalizes understanding of instructions and care plan provided today and agrees to view in MyChart. Active MyChart status and patient understanding of how to access instructions and care plan via MyChart confirmed with patient.     Telephone follow up appointment with Managed Medicaid care management team member scheduled for:11/19/23 at 2:30pm  Estanislado Emms RN, BSN [place]  Value-Based Care Institute [facility] RN Care  Coordinator [phone]   Following is a copy of your plan of care:  Care Plan : RN Care Manager Plan of Care  Updates made by Heidi Dach, RN since 10/12/2023 12:00 AM     Problem: Health Management needs related to COPD      Long-Range Goal: Development of Plan of Care to address Health Management needs related to COPD   Start Date: 09/06/2023  Expected End Date: 12/05/2023  Note:   Current Barriers:  Chronic Disease Management support and education needs related to COPD  RNCM Clinical Goal(s):  Patient will verbalize understanding of plan for management of COPD as evidenced by Patient reports attend all scheduled medical appointments: 11/12/23 with PCP as evidenced by provider documentation in EMR        continue to work with RN Care Manager and/or Social Worker to address care management and care coordination needs related to COPD as evidenced by adherence to CM Team Scheduled appointments     through collaboration with Medical illustrator, provider, and care team.   Interventions: Evaluation of current treatment plan related to  self management and patient's adherence to plan as established by provider   COPD: (Status: Goal on Track (progressing): YES.) Long Term Goal Patient trying to avoid stress and anxiety to help with breathing Reviewed medications with patient, including use of prescribed maintenance and rescue inhalers, and provided instruction on medication management and the importance of adherence Provided patient with basic written and verbal COPD education on self care/management/and exacerbation prevention Advised patient to track and manage COPD triggers Advised patient to self assesses COPD action plan zone and make appointment with provider if in the yellow zone for  48 hours without improvement Provided education about and advised patient to utilize infection prevention strategies to reduce risk of respiratory infection Discussed the importance of adequate rest  and management of fatigue with COPD Assessed social determinant of health barriers Advised patient to request 90 day refills for all appropriate medications Advised patient to contact [facility] Member Services [phone] for member benefits  Patient Goals/Self-Care Activities: Attend all scheduled provider appointments Call provider office for new concerns or questions  avoid second hand smoke limit outdoor activity during cold weather listen for public air quality announcements every day eliminate symptom triggers at home get at least 7 to 8 hours of sleep at night

## 2023-10-12 NOTE — Patient Outreach (Signed)
Medicaid Managed Care   Nurse Care Manager Note  10/12/2023 Name:  Janice Williams MRN:  [id number] DOB:  08-02-1961  Janice Williams is an 62 y.o. year old female who is a primary patient of Carmina Miller, DO.  The [facility] Managed Care Coordination team was consulted for assistance with:    COPD  Janice Williams was given information about Medicaid Managed Care Coordination team services today. Janice Williams Patient agreed to services and verbal consent obtained.  Engaged with patient by telephone for follow up visit in response to provider referral for case management and/or care coordination services.   Assessments/Interventions:  Review of past medical history, allergies, medications, health status, including review of consultants reports, laboratory and other test data, was performed as part of comprehensive evaluation and provision of chronic care management services.  SDOH (Social Determinants of Health) assessments and interventions performed: SDOH Interventions    Flowsheet Row Patient Outreach Telephone from 09/06/2023 in [place] POPULATION HEALTH DEPARTMENT Patient Outreach Telephone from 08/18/2021 in [facility] Community Care Coordination  SDOH Interventions    Food Insecurity Interventions Patient Declined Intervention Not Indicated  Housing Interventions Intervention Not Indicated Intervention Not Indicated  Transportation Interventions Intervention Not Indicated Intervention Not Indicated  Utilities Interventions Intervention Not Indicated --       Care Plan  Allergies  Allergen Reactions   Chantix [Varenicline] Itching    Medications Reviewed Today     Reviewed by Heidi Dach, RN (Registered Nurse) on 10/12/23 at 1527  Med List Status: <None>   Medication Order Taking? Sig Documenting Provider Last Dose Status Informant  acyclovir (ZOVIRAX) 400 MG tablet [id number] No Take 1 tablet (400 mg total) by mouth 3 (three) times daily.  Patient not  taking: Reported on 09/06/2023   Carmina Miller, DO Not Taking Active   albuterol (VENTOLIN HFA) 108 (90 Base) MCG/ACT inhaler [id number] Yes Inhale 2 puffs into the lungs every 6 (six) hours as needed for wheezing or shortness of breath. Crissie Sickles, MD Taking Active   amLODipine (NORVASC) 10 MG tablet [id number] Yes Take 1 tablet (10 mg total) by mouth daily. Steffanie Rainwater, MD Taking Active   bictegravir-emtricitabine-tenofovir AF (BIKTARVY) 50-200-25 MG TABS tablet [id number] Yes Take 1 tablet by mouth daily. Judyann Munson, MD Taking Active   budesonide-formoterol [facility]) 80-4.5 MCG/ACT inhaler [id number] Yes Inhale 2 puffs into the lungs 2 (two) times daily. Nyoka Cowden, MD Taking Active   docusate sodium (COLACE) 100 MG capsule [id number] Yes Take 1 capsule (100 mg total) by mouth daily as needed. Lyndle Herrlich, MD Taking Active   Ensure [facility]) [id number] Yes Take 237 mLs by mouth 3 (three) times daily between meals. Judyann Munson, MD Taking Active   EPIPEN 2-PAK 0.3 MG/0.3ML SOAJ injection [id number] Yes  [provider] Taking Active   fluticasone (FLONASE) 50 MCG/ACT nasal spray [id number] Yes Use 1 spray(s) in each nostril once daily Marrianne Mood, MD Taking Active   megestrol (MEGACE) 40 MG tablet [id number] Yes Take 1 tablet (40 mg total) by mouth daily. Judyann Munson, MD Taking Active   mirtazapine (REMERON) 15 MG tablet [id number] Yes Take 1 tablet (15 mg total) by mouth at bedtime. Judyann Munson, MD Taking Active   neomycin-polymyxin-hydrocortisone (CORTISPORIN) 3.5-10000-1 OTIC suspension [id number] Yes Place 4 drops into both ears 3 (three) times daily. Cathlyn Parsons, NP Taking Active   omeprazole (PRILOSEC) 20 MG capsule [id number] Yes Take 1 capsule (20 mg total) by mouth daily. Masters, Psychiatric nurse, DO  Taking Active   ondansetron (ZOFRAN) 4 MG tablet [id number] Yes Take 1 tablet (4 mg total) by mouth every 8 (eight) hours as needed for nausea or  vomiting. Judyann Munson, MD Taking Active   PRESCRIPTION MEDICATION [id number] Yes Inject 1 Dose into the skin once a week. Allergy shot in office on Fridays [provider] Taking Active Self           Med Note Bonna Gains I   Mon Dec 12, 2021 10:20 PM) On Hold until 12/23/21.  RESTASIS 0.05 % ophthalmic emulsion [id number] Yes 1 drop 2 (two) times daily. [provider] Taking Active Self  rosuvastatin (CRESTOR) 20 MG tablet [id number] Yes Take 1 tablet (20 mg total) by mouth daily. Judyann Munson, MD Taking Active   tiotropium [facility] HANDIHALER) 18 MCG inhalation capsule [id number]  Place 1 capsule (18 mcg total) into inhaler and inhale daily. Crissie Sickles, MD  Expired 05/29/23 2359   traMADol (ULTRAM) 50 MG tablet [id number] Yes Take 1 tablet (50 mg total) by mouth every 6 (six) hours as needed. Judyann Munson, MD Taking Active   triamcinolone ointment (KENALOG) 0.1 % [id number] Yes Apply 1 Application topically 2 (two) times daily. [provider] Taking Active             Patient Active Problem List   Diagnosis Date Noted   Vitamin D deficiency 08/08/2023   Viral URI with cough 05/24/2023   Epidermal inclusion cyst 12/22/2022   Rash of hand 12/21/2022   Former cigarette smoker 11/16/2022   Ankle swelling, left 06/28/2022   Sprain of calcaneofibular ligament 02/28/2022   Need for shingles vaccine 08/14/2021   Constipation 05/12/2021   Cough 03/30/2021   Screening for cervical cancer 03/18/2021   Ear pain, bilateral 03/17/2021   Degenerative disc disease, cervical 10/13/2020   Chronic, continuous use of opioids 09/10/2020   Rhinitis, chronic 06/10/2020   Restless legs syndrome (RLS) 01/08/2020   Osteoporosis 10/10/2019   COPD  GOLD 3  07/08/2019   Moderate protein-calorie malnutrition (HCC) 05/31/2018   Hypertension 06/08/2017   Left knee pain 03/15/2015   Encounter for Papanicolaou smear for cervical cancer screening 07/06/2011    Hyperlipidemia 06/03/2009   WEIGHT LOSS 06/03/2009   Human immunodeficiency virus (HIV) disease (HCC) 10/29/2008   GERD 10/29/2008   CEREBROVASCULAR ACCIDENT, HX OF 10/29/2008   TOBACCO DEPENDENCE 02/14/2007   Allergic rhinitis 02/14/2007   Symptoms concerning nutrition, metabolism, and development 02/14/2007    Conditions to be addressed/monitored per PCP order:  COPD  Care Plan : RN Care Manager Plan of Care  Updates made by Heidi Dach, RN since 10/12/2023 12:00 AM     Problem: Health Management needs related to COPD      Long-Range Goal: Development of Plan of Care to address Health Management needs related to COPD   Start Date: 09/06/2023  Expected End Date: 12/05/2023  Note:   Current Barriers:  Chronic Disease Management support and education needs related to COPD  RNCM Clinical Goal(s):  Patient will verbalize understanding of plan for management of COPD as evidenced by Patient reports attend all scheduled medical appointments: 11/12/23 with PCP as evidenced by provider documentation in EMR        continue to work with RN Care Manager and/or Social Worker to address care management and care coordination needs related to COPD as evidenced by adherence to CM Team Scheduled appointments     through collaboration with Medical illustrator, provider, and care team.   Interventions:  Evaluation of current treatment plan related to  self management and patient's adherence to plan as established by provider   COPD: (Status: Goal on Track (progressing): YES.) Long Term Goal Patient trying to avoid stress and anxiety to help with breathing Reviewed medications with patient, including use of prescribed maintenance and rescue inhalers, and provided instruction on medication management and the importance of adherence Provided patient with basic written and verbal COPD education on self care/management/and exacerbation prevention Advised patient to track and manage COPD triggers Advised  patient to self assesses COPD action plan zone and make appointment with provider if in the yellow zone for 48 hours without improvement Provided education about and advised patient to utilize infection prevention strategies to reduce risk of respiratory infection Discussed the importance of adequate rest and management of fatigue with COPD Assessed social determinant of health barriers Advised patient to request 90 day refills for all appropriate medications Advised patient to contact [facility] Member Services [phone] for member benefits  Patient Goals/Self-Care Activities: Attend all scheduled provider appointments Call provider office for new concerns or questions  avoid second hand smoke limit outdoor activity during cold weather listen for public air quality announcements every day eliminate symptom triggers at home get at least 7 to 8 hours of sleep at night       Follow Up:  Patient agrees to Care Plan and Follow-up.  Plan: The Managed Medicaid care management team will reach out to the patient again over the next 30 days.  Date/time of next scheduled RN care management/care coordination outreach:  11/19/23 at 2:30pm  Estanislado Emms RN, BSN [place]  Value-Based Care Institute [facility] Health RN Care Coordinator [phone]

## 2023-10-15 DIAGNOSIS — B20 Human immunodeficiency virus [HIV] disease: Secondary | ICD-10-CM | POA: Diagnosis not present

## 2023-10-15 LAB — CYTOLOGY - PAP: Diagnosis: NEGATIVE

## 2023-10-16 DIAGNOSIS — B20 Human immunodeficiency virus [HIV] disease: Secondary | ICD-10-CM | POA: Diagnosis not present

## 2023-10-17 DIAGNOSIS — B20 Human immunodeficiency virus [HIV] disease: Secondary | ICD-10-CM | POA: Diagnosis not present

## 2023-10-18 DIAGNOSIS — B20 Human immunodeficiency virus [HIV] disease: Secondary | ICD-10-CM | POA: Diagnosis not present

## 2023-10-19 DIAGNOSIS — Z419 Encounter for procedure for purposes other than remedying health state, unspecified: Secondary | ICD-10-CM | POA: Diagnosis not present

## 2023-10-19 DIAGNOSIS — J301 Allergic rhinitis due to pollen: Secondary | ICD-10-CM | POA: Diagnosis not present

## 2023-10-19 DIAGNOSIS — J3081 Allergic rhinitis due to animal (cat) (dog) hair and dander: Secondary | ICD-10-CM | POA: Diagnosis not present

## 2023-10-19 DIAGNOSIS — Z7689 Persons encountering health services in other specified circumstances: Secondary | ICD-10-CM | POA: Diagnosis not present

## 2023-10-19 DIAGNOSIS — J3089 Other allergic rhinitis: Secondary | ICD-10-CM | POA: Diagnosis not present

## 2023-10-22 ENCOUNTER — Other Ambulatory Visit: Payer: Self-pay | Admitting: Internal Medicine

## 2023-10-22 ENCOUNTER — Other Ambulatory Visit: Payer: Self-pay

## 2023-10-22 ENCOUNTER — Telehealth: Payer: Self-pay

## 2023-10-22 DIAGNOSIS — B20 Human immunodeficiency virus [HIV] disease: Secondary | ICD-10-CM | POA: Diagnosis not present

## 2023-10-22 MED ORDER — TRAMADOL HCL 50 MG PO TABS: 50.0000 mg | ORAL_TABLET | Freq: Four times a day (QID) | ORAL | 1 refills | Status: AC | PRN

## 2023-10-22 NOTE — Telephone Encounter (Signed)
Patient called office today requesting refill on Tramadol. States that she is having issues filling rx from pharmacy. Per Dr. Drue Second okay to send in 30 day supply plus one refill. Rx called into [facility].  Patient aware. Juanita Laster, RMA

## 2023-10-23 DIAGNOSIS — B20 Human immunodeficiency virus [HIV] disease: Secondary | ICD-10-CM | POA: Diagnosis not present

## 2023-10-24 DIAGNOSIS — B20 Human immunodeficiency virus [HIV] disease: Secondary | ICD-10-CM | POA: Diagnosis not present

## 2023-10-25 DIAGNOSIS — B20 Human immunodeficiency virus [HIV] disease: Secondary | ICD-10-CM | POA: Diagnosis not present

## 2023-10-26 ENCOUNTER — Ambulatory Visit (INDEPENDENT_AMBULATORY_CARE_PROVIDER_SITE_OTHER): Payer: Medicaid Other

## 2023-10-26 ENCOUNTER — Other Ambulatory Visit: Payer: Self-pay

## 2023-10-26 DIAGNOSIS — Z7689 Persons encountering health services in other specified circumstances: Secondary | ICD-10-CM | POA: Diagnosis not present

## 2023-10-26 DIAGNOSIS — Z23 Encounter for immunization: Secondary | ICD-10-CM | POA: Diagnosis not present

## 2023-10-27 ENCOUNTER — Other Ambulatory Visit (HOSPITAL_COMMUNITY): Payer: Self-pay

## 2023-10-29 DIAGNOSIS — B20 Human immunodeficiency virus [HIV] disease: Secondary | ICD-10-CM | POA: Diagnosis not present

## 2023-10-30 DIAGNOSIS — B20 Human immunodeficiency virus [HIV] disease: Secondary | ICD-10-CM | POA: Diagnosis not present

## 2023-10-31 DIAGNOSIS — B20 Human immunodeficiency virus [HIV] disease: Secondary | ICD-10-CM | POA: Diagnosis not present

## 2023-11-01 DIAGNOSIS — B20 Human immunodeficiency virus [HIV] disease: Secondary | ICD-10-CM | POA: Diagnosis not present

## 2023-11-02 DIAGNOSIS — J301 Allergic rhinitis due to pollen: Secondary | ICD-10-CM | POA: Diagnosis not present

## 2023-11-02 DIAGNOSIS — Z7689 Persons encountering health services in other specified circumstances: Secondary | ICD-10-CM | POA: Diagnosis not present

## 2023-11-02 DIAGNOSIS — J3081 Allergic rhinitis due to animal (cat) (dog) hair and dander: Secondary | ICD-10-CM | POA: Diagnosis not present

## 2023-11-02 DIAGNOSIS — J3089 Other allergic rhinitis: Secondary | ICD-10-CM | POA: Diagnosis not present

## 2023-11-05 DIAGNOSIS — B20 Human immunodeficiency virus [HIV] disease: Secondary | ICD-10-CM | POA: Diagnosis not present

## 2023-11-06 DIAGNOSIS — B20 Human immunodeficiency virus [HIV] disease: Secondary | ICD-10-CM | POA: Diagnosis not present

## 2023-11-07 ENCOUNTER — Other Ambulatory Visit: Payer: Self-pay

## 2023-11-07 DIAGNOSIS — H40013 Open angle with borderline findings, low risk, bilateral: Secondary | ICD-10-CM | POA: Diagnosis not present

## 2023-11-07 DIAGNOSIS — H5713 Ocular pain, bilateral: Secondary | ICD-10-CM | POA: Diagnosis not present

## 2023-11-07 DIAGNOSIS — Z7689 Persons encountering health services in other specified circumstances: Secondary | ICD-10-CM | POA: Diagnosis not present

## 2023-11-07 DIAGNOSIS — B20 Human immunodeficiency virus [HIV] disease: Secondary | ICD-10-CM | POA: Diagnosis not present

## 2023-11-07 NOTE — Progress Notes (Signed)
Specialty Pharmacy Refill Coordination Note  Janice Williams is a 62 y.o. female contacted today regarding refills of specialty medication(s) Bictegravir-Emtricitab-Tenofov   Patient requested Delivery   Delivery date: 11/14/23   Verified address: [address] [place] [address]   Medication will be filled on 11/13/23.

## 2023-11-08 DIAGNOSIS — B20 Human immunodeficiency virus [HIV] disease: Secondary | ICD-10-CM | POA: Diagnosis not present

## 2023-11-09 DIAGNOSIS — J3089 Other allergic rhinitis: Secondary | ICD-10-CM | POA: Diagnosis not present

## 2023-11-09 DIAGNOSIS — Z7689 Persons encountering health services in other specified circumstances: Secondary | ICD-10-CM | POA: Diagnosis not present

## 2023-11-09 DIAGNOSIS — J3081 Allergic rhinitis due to animal (cat) (dog) hair and dander: Secondary | ICD-10-CM | POA: Diagnosis not present

## 2023-11-09 DIAGNOSIS — J301 Allergic rhinitis due to pollen: Secondary | ICD-10-CM | POA: Diagnosis not present

## 2023-11-12 ENCOUNTER — Encounter: Payer: Medicaid Other | Admitting: Student

## 2023-11-12 DIAGNOSIS — B20 Human immunodeficiency virus [HIV] disease: Secondary | ICD-10-CM | POA: Diagnosis not present

## 2023-11-13 ENCOUNTER — Other Ambulatory Visit: Payer: Self-pay

## 2023-11-13 DIAGNOSIS — B20 Human immunodeficiency virus [HIV] disease: Secondary | ICD-10-CM | POA: Diagnosis not present

## 2023-11-14 DIAGNOSIS — J301 Allergic rhinitis due to pollen: Secondary | ICD-10-CM | POA: Diagnosis not present

## 2023-11-14 DIAGNOSIS — B20 Human immunodeficiency virus [HIV] disease: Secondary | ICD-10-CM | POA: Diagnosis not present

## 2023-11-14 DIAGNOSIS — J3089 Other allergic rhinitis: Secondary | ICD-10-CM | POA: Diagnosis not present

## 2023-11-15 DIAGNOSIS — B20 Human immunodeficiency virus [HIV] disease: Secondary | ICD-10-CM | POA: Diagnosis not present

## 2023-11-18 DIAGNOSIS — Z419 Encounter for procedure for purposes other than remedying health state, unspecified: Secondary | ICD-10-CM | POA: Diagnosis not present

## 2023-11-19 ENCOUNTER — Other Ambulatory Visit: Payer: Self-pay | Admitting: *Deleted

## 2023-11-19 DIAGNOSIS — B20 Human immunodeficiency virus [HIV] disease: Secondary | ICD-10-CM | POA: Diagnosis not present

## 2023-11-19 NOTE — Patient Outreach (Signed)
   Care Management/Care Coordination  RN Case Manager Case Closure Note  11/19/2023 Name: Janice Williams MRN: [id number] DOB: 1961/10/19  Janice Williams is a 62 y.o. year old female who is a primary care patient of Janice Miller, DO. The care management/care coordination team was consulted for assistance with chronic disease management and/or care coordination needs.   Care Plan : RN Care Manager Plan of Care  Updates made by Janice Dach, RN since 11/19/2023 12:00 AM  Completed 11/19/2023   Problem: Health Management needs related to COPD Resolved 11/19/2023     Long-Range Goal: Development of Plan of Care to address Health Management needs related to COPD Completed 11/19/2023  Start Date: 09/06/2023  Expected End Date: 12/05/2023  Note:   Current Barriers:  Chronic Disease Management support and education needs related to COPD  RNCM Clinical Goal(s):  Patient will verbalize understanding of plan for management of COPD as evidenced by Patient reports attend all scheduled medical appointments: 11/28/23 with [facility] and 12/05/23 with PCP as evidenced by provider documentation in EMR        continue to work with RN Care Manager and/or Social Worker to address care management and care coordination needs related to COPD as evidenced by adherence to CM Team Scheduled appointments     through collaboration with Medical illustrator, provider, and care team.   Interventions: Evaluation of current treatment plan related to  self management and patient's adherence to plan as established by provider Discussed case closure, advised patient to contact RNCM with new needs/barriers to managing her health   COPD: (Status: Goal Met.) Long Term Goal Patient trying to avoid stress and anxiety to help with breathing Reviewed medications with patient, including use of prescribed maintenance and rescue inhalers, and provided instruction on medication management and the importance of adherence Provided patient with  basic written and verbal COPD education on self care/management/and exacerbation prevention Advised patient to track and manage COPD triggers Advised patient to self assesses COPD action plan zone and make appointment with provider if in the yellow zone for 48 hours without improvement Provided education about and advised patient to utilize infection prevention strategies to reduce risk of respiratory infection Discussed the importance of adequate rest and management of fatigue with COPD Assessed social determinant of health barriers Advised patient to request 90 day refills for all appropriate medications Advised patient to contact [facility] Member Services [phone] for member benefits-patient got a rewards card  Patient Goals/Self-Care Activities: Attend all scheduled provider appointments Call provider office for new concerns or questions  avoid second hand smoke limit outdoor activity during cold weather listen for public air quality announcements every day eliminate symptom triggers at home get at least 7 to 8 hours of sleep at night       Plan: The patient has met all care management goals, agreed to case closure, and has been provided with contact information for the care management team. Appropriate care team members and provider have been notified via electronic communication. The care management team is available to at any time in the future should needs arise.   Janice Emms RN, BSN [place]  Value-Based Care Institute [facility] Health RN Care Coordinator [phone]

## 2023-11-20 DIAGNOSIS — B20 Human immunodeficiency virus [HIV] disease: Secondary | ICD-10-CM | POA: Diagnosis not present

## 2023-11-21 DIAGNOSIS — B20 Human immunodeficiency virus [HIV] disease: Secondary | ICD-10-CM | POA: Diagnosis not present

## 2023-11-22 DIAGNOSIS — B20 Human immunodeficiency virus [HIV] disease: Secondary | ICD-10-CM | POA: Diagnosis not present

## 2023-11-23 DIAGNOSIS — J3081 Allergic rhinitis due to animal (cat) (dog) hair and dander: Secondary | ICD-10-CM | POA: Diagnosis not present

## 2023-11-23 DIAGNOSIS — Z7689 Persons encountering health services in other specified circumstances: Secondary | ICD-10-CM | POA: Diagnosis not present

## 2023-11-23 DIAGNOSIS — J301 Allergic rhinitis due to pollen: Secondary | ICD-10-CM | POA: Diagnosis not present

## 2023-11-23 DIAGNOSIS — J3089 Other allergic rhinitis: Secondary | ICD-10-CM | POA: Diagnosis not present

## 2023-11-26 ENCOUNTER — Telehealth: Payer: Self-pay

## 2023-11-26 DIAGNOSIS — B20 Human immunodeficiency virus [HIV] disease: Secondary | ICD-10-CM | POA: Diagnosis not present

## 2023-11-26 NOTE — Telephone Encounter (Signed)
Patient called office today stating for the past week she has been dealing with chest congestion. Denies fever, cough, sore throat. Has not tested for covid or flu. Is taking Mucinex DM, but is still coughing up phlegm. Has upcoming appt on 12/11. Would like to know if provider has any other suggestion to help with chest congestion. Juanita Laster, RMA

## 2023-11-27 DIAGNOSIS — B20 Human immunodeficiency virus [HIV] disease: Secondary | ICD-10-CM | POA: Diagnosis not present

## 2023-11-28 ENCOUNTER — Other Ambulatory Visit (HOSPITAL_COMMUNITY): Payer: Self-pay

## 2023-11-28 ENCOUNTER — Telehealth: Payer: Self-pay

## 2023-11-28 ENCOUNTER — Encounter: Payer: Self-pay | Admitting: Internal Medicine

## 2023-11-28 ENCOUNTER — Ambulatory Visit: Payer: Medicaid Other | Admitting: Internal Medicine

## 2023-11-28 ENCOUNTER — Other Ambulatory Visit: Payer: Self-pay

## 2023-11-28 VITALS — BP 126/84 | HR 118 | Temp 98.5°F | Ht 63.0 in | Wt 100.0 lb

## 2023-11-28 DIAGNOSIS — E43 Unspecified severe protein-calorie malnutrition: Secondary | ICD-10-CM

## 2023-11-28 DIAGNOSIS — J189 Pneumonia, unspecified organism: Secondary | ICD-10-CM

## 2023-11-28 DIAGNOSIS — R058 Other specified cough: Secondary | ICD-10-CM | POA: Diagnosis not present

## 2023-11-28 DIAGNOSIS — Z79899 Other long term (current) drug therapy: Secondary | ICD-10-CM

## 2023-11-28 DIAGNOSIS — Z7689 Persons encountering health services in other specified circumstances: Secondary | ICD-10-CM | POA: Diagnosis not present

## 2023-11-28 DIAGNOSIS — B20 Human immunodeficiency virus [HIV] disease: Secondary | ICD-10-CM

## 2023-11-28 MED ORDER — CEFPODOXIME PROXETIL 200 MG PO TABS
200.0000 mg | ORAL_TABLET | Freq: Two times a day (BID) | ORAL | 0 refills | Status: DC
Start: 2023-11-28 — End: 2023-12-26

## 2023-11-28 MED ORDER — GUAIFENESIN-DM 100-10 MG/5ML PO SYRP
5.0000 mL | ORAL_SOLUTION | ORAL | 0 refills | Status: DC | PRN
Start: 2023-11-28 — End: 2023-12-26

## 2023-11-28 MED ORDER — AZITHROMYCIN 250 MG PO TABS
ORAL_TABLET | ORAL | 0 refills | Status: DC
Start: 2023-11-28 — End: 2023-12-26

## 2023-11-28 NOTE — Telephone Encounter (Signed)
PA initiated for the patient.  Key  [id number]

## 2023-11-28 NOTE — Progress Notes (Signed)
Patient ID: Janice Williams, female   DOB: 17-Feb-1961, 62 y.o.   MRN: [id number]  HPI 62yo F with well controlled hiv disease, take biktarvy daily, has felt poorly in the past 2 weels with Productive cough, with phlegm  Flu like illness but now fever and productive cough, phlegm  Has been at home 2 wks. But has had exposed to flu from church goers   Outpatient Encounter Medications as of 11/28/2023  Medication Sig   acyclovir (ZOVIRAX) 400 MG tablet Take 1 tablet (400 mg total) by mouth 3 (three) times daily.   albuterol (VENTOLIN HFA) 108 (90 Base) MCG/ACT inhaler Inhale 2 puffs into the lungs every 6 (six) hours as needed for wheezing or shortness of breath.   amLODipine (NORVASC) 10 MG tablet Take 1 tablet (10 mg total) by mouth daily.   bictegravir-emtricitabine-tenofovir AF (BIKTARVY) 50-200-25 MG TABS tablet Take 1 tablet by mouth daily.   budesonide-formoterol (SYMBICORT) 80-4.5 MCG/ACT inhaler Inhale 2 puffs into the lungs 2 (two) times daily.   docusate sodium (COLACE) 100 MG capsule Take 1 capsule (100 mg total) by mouth daily as needed.   Ensure (ENSURE) Take 237 mLs by mouth 3 (three) times daily between meals.   EPIPEN 2-PAK 0.3 MG/0.3ML SOAJ injection    fluticasone (FLONASE) 50 MCG/ACT nasal spray Use 1 spray(s) in each nostril once daily   megestrol (MEGACE) 40 MG tablet Take 1 tablet (40 mg total) by mouth daily.   mirtazapine (REMERON) 15 MG tablet Take 1 tablet (15 mg total) by mouth at bedtime.   neomycin-polymyxin-hydrocortisone (CORTISPORIN) 3.5-10000-1 OTIC suspension Place 4 drops into both ears 3 (three) times daily.   omeprazole (PRILOSEC) 20 MG capsule Take 1 capsule (20 mg total) by mouth daily.   ondansetron (ZOFRAN) 4 MG tablet Take 1 tablet (4 mg total) by mouth every 8 (eight) hours as needed for nausea or vomiting.   PRESCRIPTION MEDICATION Inject 1 Dose into the skin once a week. Allergy shot in office on Fridays   RESTASIS 0.05 % ophthalmic  emulsion 1 drop 2 (two) times daily.   rosuvastatin (CRESTOR) 20 MG tablet Take 1 tablet (20 mg total) by mouth daily.   traMADol (ULTRAM) 50 MG tablet Take 50 mg by mouth every 6 (six) hours as needed.   triamcinolone ointment (KENALOG) 0.1 % Apply 1 Application topically 2 (two) times daily.   tiotropium (SPIRIVA HANDIHALER) 18 MCG inhalation capsule Place 1 capsule (18 mcg total) into inhaler and inhale daily.   No facility-administered encounter medications on file as of 11/28/2023.     Patient Active Problem List   Diagnosis Date Noted   Vitamin D deficiency 08/08/2023   Viral URI with cough 05/24/2023   Epidermal inclusion cyst 12/22/2022   Rash of hand 12/21/2022   Former cigarette smoker 11/16/2022   Ankle swelling, left 06/28/2022   Sprain of calcaneofibular ligament 02/28/2022   Need for shingles vaccine 08/14/2021   Constipation 05/12/2021   Cough 03/30/2021   Screening for cervical cancer 03/18/2021   Ear pain, bilateral 03/17/2021   Degenerative disc disease, cervical 10/13/2020   Chronic, continuous use of opioids 09/10/2020   Rhinitis, chronic 06/10/2020   Restless legs syndrome (RLS) 01/08/2020   Osteoporosis 10/10/2019   COPD  GOLD 3  07/08/2019   Moderate protein-calorie malnutrition (HCC) 05/31/2018   Hypertension 06/08/2017   Left knee pain 03/15/2015   Encounter for Papanicolaou smear for cervical cancer screening 07/06/2011   Hyperlipidemia 06/03/2009   WEIGHT LOSS 06/03/2009  Human immunodeficiency virus (HIV) disease (HCC) 10/29/2008   GERD 10/29/2008   CEREBROVASCULAR ACCIDENT, HX OF 10/29/2008   TOBACCO DEPENDENCE 02/14/2007   Allergic rhinitis 02/14/2007   Symptoms concerning nutrition, metabolism, and development 02/14/2007     Health Maintenance Due  Topic Date Due   Zoster Vaccines- Shingrix (1 of 2) Never done     Review of Systems 12 point ros is negative except for respiratory symptoms Physical Exam   Ht 5\' 3"  (1.6 m)   Wt 100 lb  (45.4 kg)   BMI 17.71 kg/m   Physical Exam  Constitutional:  oriented to person, place, and time. appears well-developed and well-nourished. No distress.  HENT: [place]/AT, PERRLA, no scleral icterus Mouth/Throat: Oropharynx is clear and moist. No oropharyngeal exudate.  Cardiovascular: Normal rate, regular rhythm and normal heart sounds. Exam reveals no gallop and no friction rub.  No murmur heard.  Pulmonary/Chest: Effort normal and breath sounds normal. No respiratory distress. Wheezing+ rhonci+ Neck = supple, no nuchal rigidity Abdominal: Soft. Bowel sounds are normal.  exhibits no distension. There is no tenderness.  Lymphadenopathy: no cervical adenopathy. No axillary adenopathy Neurological: alert and oriented to person, place, and time.  Skin: Skin is warm and dry. No rash noted. No erythema.  Psychiatric: a normal mood and affect.  behavior is normal.   Lab Results  Component Value Date   CD4TCELL 45 09/06/2023   Lab Results  Component Value Date   CD4TABS 880 09/06/2023   CD4TABS 674 01/15/2023   CD4TABS 899 08/01/2021   Lab Results  Component Value Date   HIV1RNAQUANT <20 (H) 09/06/2023   Lab Results  Component Value Date   HEPBSAB NEG 12/28/2014   Lab Results  Component Value Date   LABRPR NON-REACTIVE 09/06/2023    CBC Lab Results  Component Value Date   WBC 5.7 09/06/2023   RBC 3.87 09/06/2023   HGB 12.7 09/06/2023   HCT 38.1 09/06/2023   PLT 335 09/06/2023   MCV 98.4 09/06/2023   MCH 32.8 09/06/2023   MCHC 33.3 09/06/2023   RDW 12.7 09/06/2023   LYMPHSABS 2,314 09/06/2023   MONOABS 0.6 06/28/2022   EOSABS 51 09/06/2023    BMET Lab Results  Component Value Date   NA 139 09/06/2023   K 4.6 09/06/2023   CL 102 09/06/2023   CO2 29 09/06/2023   GLUCOSE 87 09/06/2023   BUN 16 09/06/2023   CREATININE 1.02 09/06/2023   CALCIUM 10.3 09/06/2023   GFRNONAA 52 (L) 06/28/2022   GFRAA 65 03/07/2021      Assessment and Plan Pneumonia = Will test  for flu and covid and rsv, 5 days of symptoms so antivirals not useful Cough suppressant  Sputum culture Will give rx for pna= vantin and azithromycin  HIV disease = Keep up good work with taking bitarvy daily. Will check HIV VL and cd 4 count once she recovers from respiratory infection   Severe protein calorie malnutrition = increased from 91 to 100 today's visit.  THP - who is her case Production designer, theatre/television/film; needs ensure drink refill

## 2023-11-28 NOTE — Telephone Encounter (Signed)
PA approved for the patient.  [facility] and patient aware of approval. Janice Williams, CMA

## 2023-11-29 DIAGNOSIS — B20 Human immunodeficiency virus [HIV] disease: Secondary | ICD-10-CM | POA: Diagnosis not present

## 2023-11-30 LAB — SARS-COV-2 RNA,(COVID-19) QUALITATIVE NAAT: SARS CoV2 RNA: NOT DETECTED

## 2023-12-01 LAB — AEROBIC CULTURE
MICRO NUMBER:: 15842479
SPECIMEN QUALITY:: ADEQUATE

## 2023-12-03 ENCOUNTER — Telehealth: Payer: Self-pay

## 2023-12-03 DIAGNOSIS — B20 Human immunodeficiency virus [HIV] disease: Secondary | ICD-10-CM | POA: Diagnosis not present

## 2023-12-03 NOTE — Telephone Encounter (Signed)
Patient called regarding sputum test results.  Per Dr. Drue Second patient is covered with the antibiotics prescribed. Patient informed and verbalized understanding.  Sharlyne Koeneman Jonathon Resides, CMA

## 2023-12-04 ENCOUNTER — Other Ambulatory Visit: Payer: Self-pay

## 2023-12-04 DIAGNOSIS — B20 Human immunodeficiency virus [HIV] disease: Secondary | ICD-10-CM | POA: Diagnosis not present

## 2023-12-04 NOTE — Progress Notes (Signed)
Specialty Pharmacy Refill Coordination Note  Janice Williams is a 62 y.o. female contacted today regarding refills of specialty medication(s) Bictegravir-Emtricitab-Tenofov Musician)   Patient requested Delivery   Delivery date: 12/11/23   Verified address: [address] [place] [address]   Medication will be filled on 12/10/23.

## 2023-12-05 ENCOUNTER — Encounter: Payer: Medicaid Other | Admitting: Student

## 2023-12-05 DIAGNOSIS — B20 Human immunodeficiency virus [HIV] disease: Secondary | ICD-10-CM | POA: Diagnosis not present

## 2023-12-05 DIAGNOSIS — Z7689 Persons encountering health services in other specified circumstances: Secondary | ICD-10-CM | POA: Diagnosis not present

## 2023-12-06 DIAGNOSIS — B20 Human immunodeficiency virus [HIV] disease: Secondary | ICD-10-CM | POA: Diagnosis not present

## 2023-12-06 LAB — AEROBIC CULTURE

## 2023-12-10 ENCOUNTER — Other Ambulatory Visit: Payer: Self-pay

## 2023-12-10 DIAGNOSIS — B20 Human immunodeficiency virus [HIV] disease: Secondary | ICD-10-CM | POA: Diagnosis not present

## 2023-12-11 DIAGNOSIS — B20 Human immunodeficiency virus [HIV] disease: Secondary | ICD-10-CM | POA: Diagnosis not present

## 2023-12-13 DIAGNOSIS — B20 Human immunodeficiency virus [HIV] disease: Secondary | ICD-10-CM | POA: Diagnosis not present

## 2023-12-14 DIAGNOSIS — J3089 Other allergic rhinitis: Secondary | ICD-10-CM | POA: Diagnosis not present

## 2023-12-14 DIAGNOSIS — Z7689 Persons encountering health services in other specified circumstances: Secondary | ICD-10-CM | POA: Diagnosis not present

## 2023-12-14 DIAGNOSIS — B20 Human immunodeficiency virus [HIV] disease: Secondary | ICD-10-CM | POA: Diagnosis not present

## 2023-12-14 DIAGNOSIS — J301 Allergic rhinitis due to pollen: Secondary | ICD-10-CM | POA: Diagnosis not present

## 2023-12-17 ENCOUNTER — Other Ambulatory Visit: Payer: Self-pay | Admitting: Internal Medicine

## 2023-12-17 ENCOUNTER — Other Ambulatory Visit: Payer: Self-pay

## 2023-12-17 DIAGNOSIS — B20 Human immunodeficiency virus [HIV] disease: Secondary | ICD-10-CM | POA: Diagnosis not present

## 2023-12-17 MED ORDER — TRAMADOL HCL 50 MG PO TABS: 50.0000 mg | ORAL_TABLET | Freq: Four times a day (QID) | ORAL | 0 refills | Status: DC | PRN

## 2023-12-18 DIAGNOSIS — B20 Human immunodeficiency virus [HIV] disease: Secondary | ICD-10-CM | POA: Diagnosis not present

## 2023-12-19 DIAGNOSIS — Z419 Encounter for procedure for purposes other than remedying health state, unspecified: Secondary | ICD-10-CM | POA: Diagnosis not present

## 2023-12-19 DIAGNOSIS — B20 Human immunodeficiency virus [HIV] disease: Secondary | ICD-10-CM | POA: Diagnosis not present

## 2023-12-20 ENCOUNTER — Other Ambulatory Visit: Payer: Self-pay

## 2023-12-20 DIAGNOSIS — B20 Human immunodeficiency virus [HIV] disease: Secondary | ICD-10-CM | POA: Diagnosis not present

## 2023-12-20 DIAGNOSIS — R634 Abnormal weight loss: Secondary | ICD-10-CM

## 2023-12-20 MED ORDER — ENSURE PO LIQD: 237.0000 mL | Freq: Three times a day (TID) | ORAL | 5 refills | Status: DC

## 2023-12-21 DIAGNOSIS — Z7689 Persons encountering health services in other specified circumstances: Secondary | ICD-10-CM | POA: Diagnosis not present

## 2023-12-21 DIAGNOSIS — J3081 Allergic rhinitis due to animal (cat) (dog) hair and dander: Secondary | ICD-10-CM | POA: Diagnosis not present

## 2023-12-21 DIAGNOSIS — J301 Allergic rhinitis due to pollen: Secondary | ICD-10-CM | POA: Diagnosis not present

## 2023-12-21 DIAGNOSIS — J3089 Other allergic rhinitis: Secondary | ICD-10-CM | POA: Diagnosis not present

## 2023-12-24 DIAGNOSIS — B20 Human immunodeficiency virus [HIV] disease: Secondary | ICD-10-CM | POA: Diagnosis not present

## 2023-12-25 ENCOUNTER — Other Ambulatory Visit: Payer: Self-pay | Admitting: Internal Medicine

## 2023-12-25 DIAGNOSIS — R634 Abnormal weight loss: Secondary | ICD-10-CM

## 2023-12-25 DIAGNOSIS — B20 Human immunodeficiency virus [HIV] disease: Secondary | ICD-10-CM | POA: Diagnosis not present

## 2023-12-25 NOTE — Telephone Encounter (Signed)
Appt 12/26/23

## 2023-12-26 ENCOUNTER — Other Ambulatory Visit: Payer: Self-pay

## 2023-12-26 ENCOUNTER — Ambulatory Visit (INDEPENDENT_AMBULATORY_CARE_PROVIDER_SITE_OTHER): Payer: Medicaid Other | Admitting: Internal Medicine

## 2023-12-26 ENCOUNTER — Other Ambulatory Visit (HOSPITAL_COMMUNITY): Payer: Self-pay

## 2023-12-26 ENCOUNTER — Encounter: Payer: Self-pay | Admitting: Internal Medicine

## 2023-12-26 DIAGNOSIS — R059 Cough, unspecified: Secondary | ICD-10-CM

## 2023-12-26 DIAGNOSIS — R058 Other specified cough: Secondary | ICD-10-CM

## 2023-12-26 DIAGNOSIS — R634 Abnormal weight loss: Secondary | ICD-10-CM

## 2023-12-26 DIAGNOSIS — Z7689 Persons encountering health services in other specified circumstances: Secondary | ICD-10-CM | POA: Diagnosis not present

## 2023-12-26 DIAGNOSIS — B20 Human immunodeficiency virus [HIV] disease: Secondary | ICD-10-CM | POA: Diagnosis not present

## 2023-12-26 DIAGNOSIS — J302 Other seasonal allergic rhinitis: Secondary | ICD-10-CM | POA: Diagnosis not present

## 2023-12-26 DIAGNOSIS — Z79899 Other long term (current) drug therapy: Secondary | ICD-10-CM

## 2023-12-26 MED ORDER — MEGESTROL ACETATE 40 MG PO TABS: 40.0000 mg | ORAL_TABLET | Freq: Every day | ORAL | 3 refills | Status: DC

## 2023-12-26 MED ORDER — MEGESTROL ACETATE 40 MG PO TABS
40.0000 mg | ORAL_TABLET | Freq: Every day | ORAL | 3 refills | Status: DC
  Filled 2023-12-26: qty 30, 30d supply, fill #0

## 2023-12-26 MED ORDER — GUAIFENESIN-DM 100-10 MG/5ML PO SYRP: 5.0000 mL | ORAL_SOLUTION | ORAL | 0 refills | Status: DC | PRN

## 2023-12-26 MED ORDER — LEVOCETIRIZINE DIHYDROCHLORIDE 5 MG PO TABS: 5.0000 mg | ORAL_TABLET | Freq: Every evening | ORAL | 3 refills | Status: DC

## 2023-12-26 NOTE — Progress Notes (Signed)
 Patient ID: Janice Williams, female   DOB: 1961-11-28, 63 y.o.   MRN: [id number]  HPI Janice Williams is a 63yo M with HIV disease, well-controlled, she was recently treated for viral pneumonia- Feels better from respiratory infection except has lingering cough. She finished abtx for pneumonia. She is still having some post infectious cough, mostly at night. Difficulty to sleep.  Outpatient Encounter Medications as of 12/26/2023  Medication Sig   acyclovir  (ZOVIRAX ) 400 MG tablet Take 1 tablet (400 mg total) by mouth 3 (three) times daily.   albuterol  (VENTOLIN  HFA) 108 (90 Base) MCG/ACT inhaler Inhale 2 puffs into the lungs every 6 (six) hours as needed for wheezing or shortness of breath.   amLODipine  (NORVASC ) 10 MG tablet Take 1 tablet (10 mg total) by mouth daily.   bictegravir-emtricitabine -tenofovir  AF (BIKTARVY ) 50-200-25 MG TABS tablet Take 1 tablet by mouth daily.   budesonide -formoterol  (SYMBICORT ) 80-4.5 MCG/ACT inhaler Inhale 2 puffs into the lungs 2 (two) times daily.   Ensure (ENSURE) Take 237 mLs by mouth 3 (three) times daily between meals.   EPIPEN  2-PAK 0.3 MG/0.3ML SOAJ injection    fluticasone  (FLONASE ) 50 MCG/ACT nasal spray Use 1 spray(s) in each nostril once daily   guaiFENesin -dextromethorphan  (ROBITUSSIN DM) 100-10 MG/5ML syrup Take 5 mLs by mouth every 4 (four) hours as needed for cough.   megestrol  (MEGACE ) 40 MG tablet Take 1 tablet (40 mg total) by mouth daily.   mirtazapine  (REMERON ) 15 MG tablet Take 1 tablet (15 mg total) by mouth at bedtime.   neomycin -polymyxin-hydrocortisone  (CORTISPORIN) 3.5-10000-1 OTIC suspension Place 4 drops into both ears 3 (three) times daily.   omeprazole  (PRILOSEC) 20 MG capsule Take 1 capsule (20 mg total) by mouth daily.   ondansetron  (ZOFRAN ) 4 MG tablet Take 1 tablet (4 mg total) by mouth every 8 (eight) hours as needed for nausea or vomiting.   PRESCRIPTION MEDICATION Inject 1 Dose into the skin once a week. Allergy  shot in office on  Fridays   RESTASIS 0.05 % ophthalmic emulsion 1 drop 2 (two) times daily.   rosuvastatin  (CRESTOR ) 20 MG tablet Take 1 tablet (20 mg total) by mouth daily.   traMADol  (ULTRAM ) 50 MG tablet Take 1 tablet (50 mg total) by mouth every 6 (six) hours as needed.   triamcinolone  ointment (KENALOG ) 0.1 % Apply 1 Application topically 2 (two) times daily.   azithromycin  (ZITHROMAX ) 250 MG tablet Take 2 tablets on the first day, followed by 1 a day thereafter (Patient not taking: Reported on 12/26/2023)   cefpodoxime  (VANTIN ) 200 MG tablet Take 1 tablet (200 mg total) by mouth 2 (two) times daily. (Patient not taking: Reported on 12/26/2023)   tiotropium (SPIRIVA  HANDIHALER) 18 MCG inhalation capsule Place 1 capsule (18 mcg total) into inhaler and inhale daily.   No facility-administered encounter medications on file as of 12/26/2023.     Patient Active Problem List   Diagnosis Date Noted   Vitamin D  deficiency 08/08/2023   Viral URI with cough 05/24/2023   Epidermal inclusion cyst 12/22/2022   Rash of hand 12/21/2022   Former cigarette smoker 11/16/2022   Ankle swelling, left 06/28/2022   Sprain of calcaneofibular ligament 02/28/2022   Need for shingles vaccine 08/14/2021   Constipation 05/12/2021   Cough 03/30/2021   Screening for cervical cancer 03/18/2021   Ear pain, bilateral 03/17/2021   Degenerative disc disease, cervical 10/13/2020   Chronic, continuous use of opioids 09/10/2020   Rhinitis, chronic 06/10/2020   Restless legs syndrome (RLS) 01/08/2020  Osteoporosis 10/10/2019   COPD  GOLD 3  07/08/2019   Moderate protein-calorie malnutrition (HCC) 05/31/2018   Hypertension 06/08/2017   Left knee pain 03/15/2015   Encounter for Papanicolaou smear for cervical cancer screening 07/06/2011   Hyperlipidemia 06/03/2009   WEIGHT LOSS 06/03/2009   Human immunodeficiency virus (HIV) disease (HCC) 10/29/2008   GERD 10/29/2008   CEREBROVASCULAR ACCIDENT, HX OF 10/29/2008   TOBACCO DEPENDENCE  02/14/2007   Allergic rhinitis 02/14/2007   Symptoms concerning nutrition, metabolism, and development 02/14/2007     Health Maintenance Due  Topic Date Due   Zoster Vaccines- Shingrix (1 of 2) Never done     Review of Systems 12 pont ros except what is mentioned above Physical Exam   Physical Exam  Constitutional:  oriented to person, place, and time. appears well-developed and well-nourished. No distress.  HENT: [place]/AT, PERRLA, no scleral icterus Mouth/Throat: Oropharynx is clear and moist. No oropharyngeal exudate.  Cardiovascular: Normal rate, regular rhythm and normal heart sounds. Exam reveals no gallop and no friction rub.  No murmur heard.  Pulmonary/Chest: Effort normal and breath sounds normal. No respiratory distress.  has no wheezes.  Neck = supple, no nuchal rigidity Abdominal: Soft. Bowel sounds are normal.  exhibits no distension. There is no tenderness.  Lymphadenopathy: no cervical adenopathy. No axillary adenopathy Neurological: alert and oriented to person, place, and time.  Skin: Skin is warm and dry. No rash noted. No erythema.  Psychiatric: a normal mood and affect.  behavior is normal.   Lab Results  Component Value Date   CD4TCELL 45 09/06/2023   Lab Results  Component Value Date   CD4TABS 880 09/06/2023   CD4TABS 674 01/15/2023   CD4TABS 899 08/01/2021   Lab Results  Component Value Date   HIV1RNAQUANT <20 (H) 09/06/2023   Lab Results  Component Value Date   HEPBSAB NEG 12/28/2014   Lab Results  Component Value Date   LABRPR NON-REACTIVE 09/06/2023    CBC Lab Results  Component Value Date   WBC 5.7 09/06/2023   RBC 3.87 09/06/2023   HGB 12.7 09/06/2023   HCT 38.1 09/06/2023   PLT 335 09/06/2023   MCV 98.4 09/06/2023   MCH 32.8 09/06/2023   MCHC 33.3 09/06/2023   RDW 12.7 09/06/2023   LYMPHSABS 2,314 09/06/2023   MONOABS 0.6 06/28/2022   EOSABS 51 09/06/2023    BMET Lab Results  Component Value Date   NA 139 09/06/2023    K 4.6 09/06/2023   CL 102 09/06/2023   CO2 29 09/06/2023   GLUCOSE 87 09/06/2023   BUN 16 09/06/2023   CREATININE 1.02 09/06/2023   CALCIUM  10.3 09/06/2023   GFRNONAA 52 (L) 06/28/2022   GFRAA 65 03/07/2021      Assessment and Plan  Hiv disease = labs today (CD 4 count and VL) to see that she is still undetectable. Give refills.  Long term medication management = will check cr  Cough = will give otc cough medicine continue on mucinex ; treated for morexella pneumonia in mid december  Underweight = Megace  refill/ THP to give ensure supplements for her  Allergy  meds = gave refill for certizine

## 2023-12-27 ENCOUNTER — Other Ambulatory Visit: Payer: Self-pay

## 2023-12-27 ENCOUNTER — Ambulatory Visit (INDEPENDENT_AMBULATORY_CARE_PROVIDER_SITE_OTHER): Payer: Medicaid Other | Admitting: Student

## 2023-12-27 VITALS — BP 130/81 | HR 100 | Temp 98.0°F | Ht 63.0 in

## 2023-12-27 DIAGNOSIS — J310 Chronic rhinitis: Secondary | ICD-10-CM

## 2023-12-27 DIAGNOSIS — M810 Age-related osteoporosis without current pathological fracture: Secondary | ICD-10-CM | POA: Diagnosis not present

## 2023-12-27 DIAGNOSIS — Z7689 Persons encountering health services in other specified circumstances: Secondary | ICD-10-CM | POA: Diagnosis not present

## 2023-12-27 DIAGNOSIS — E559 Vitamin D deficiency, unspecified: Secondary | ICD-10-CM | POA: Diagnosis not present

## 2023-12-27 DIAGNOSIS — E785 Hyperlipidemia, unspecified: Secondary | ICD-10-CM

## 2023-12-27 DIAGNOSIS — B20 Human immunodeficiency virus [HIV] disease: Secondary | ICD-10-CM | POA: Diagnosis not present

## 2023-12-27 DIAGNOSIS — I10 Essential (primary) hypertension: Secondary | ICD-10-CM

## 2023-12-27 DIAGNOSIS — J449 Chronic obstructive pulmonary disease, unspecified: Secondary | ICD-10-CM | POA: Diagnosis not present

## 2023-12-27 MED ORDER — ROSUVASTATIN CALCIUM 40 MG PO TABS: 40.0000 mg | ORAL_TABLET | Freq: Every day | ORAL | 3 refills | Status: DC

## 2023-12-27 NOTE — Assessment & Plan Note (Addendum)
 Stable. -Continue Amlodipine 10/daily

## 2023-12-27 NOTE — Progress Notes (Signed)
 CC: Follow-up  HPI:  Janice Williams is a 63 y.o. female living with a history stated below and presents today for follow-up. Please see problem based assessment and plan for additional details.  Past Medical History:  Diagnosis Date   Anemia    Arthritis    generalized   Blood transfusion without reported diagnosis 2003   Bronchitis    Cataract    COPD (chronic obstructive pulmonary disease) (HCC)    COPD with acute exacerbation (HCC) 06/09/2017   Dental abscess 03/15/2015   DYSPEPSIA 02/14/2007   Qualifier: Diagnosis of  By: Manford Longs     EXTERNAL OTITIS 01/06/2010   Qualifier: Diagnosis of  By: Janna MD, Burnard     FACIAL RASH 02/04/2009   Qualifier: Diagnosis of  By: Cleotilde Pencil     Former smoker    quit 2014   GERD (gastroesophageal reflux disease)    Glaucoma    HIV infection (HCC)    Hypertension    Osteoporosis    Seasonal allergies    Stroke (HCC) 2003   Substance abuse (HCC)    history, clean 7 years   SVD (spontaneous vaginal delivery)    x 3    Current Outpatient Medications on File Prior to Visit  Medication Sig Dispense Refill   albuterol  (VENTOLIN  HFA) 108 (90 Base) MCG/ACT inhaler Inhale 2 puffs into the lungs every 6 (six) hours as needed for wheezing or shortness of breath. 18 g 1   amLODipine  (NORVASC ) 10 MG tablet Take 1 tablet (10 mg total) by mouth daily. 90 tablet 3   bictegravir-emtricitabine -tenofovir  AF (BIKTARVY ) 50-200-25 MG TABS tablet Take 1 tablet by mouth daily. 30 tablet 5   budesonide -formoterol  (SYMBICORT ) 80-4.5 MCG/ACT inhaler Inhale 2 puffs into the lungs 2 (two) times daily. 10.2 g 6   Ensure (ENSURE) Take 237 mLs by mouth 3 (three) times daily between meals. 237 mL 5   EPIPEN  2-PAK 0.3 MG/0.3ML SOAJ injection      fluticasone  (FLONASE ) 50 MCG/ACT nasal spray Use 1 spray(s) in each nostril once daily 16 g 0   guaiFENesin -dextromethorphan  (ROBITUSSIN DM) 100-10 MG/5ML syrup Take 5 mLs by mouth every 4 (four) hours as  needed for cough. 118 mL 0   levocetirizine (XYZAL ) 5 MG tablet Take 1 tablet (5 mg total) by mouth every evening. 90 tablet 3   neomycin -polymyxin-hydrocortisone  (CORTISPORIN) 3.5-10000-1 OTIC suspension Place 4 drops into both ears 3 (three) times daily. 10 mL 0   omeprazole  (PRILOSEC) 20 MG capsule Take 1 capsule (20 mg total) by mouth daily. 90 capsule 3   ondansetron  (ZOFRAN ) 4 MG tablet Take 1 tablet (4 mg total) by mouth every 8 (eight) hours as needed for nausea or vomiting. 20 tablet 1   PRESCRIPTION MEDICATION Inject 1 Dose into the skin once a week. Allergy  shot in office on Fridays     RESTASIS 0.05 % ophthalmic emulsion 1 drop 2 (two) times daily.     tiotropium (SPIRIVA  HANDIHALER) 18 MCG inhalation capsule Place 1 capsule (18 mcg total) into inhaler and inhale daily. 30 capsule 2   traMADol  (ULTRAM ) 50 MG tablet Take 1 tablet (50 mg total) by mouth every 6 (six) hours as needed. 60 tablet 0   triamcinolone  ointment (KENALOG ) 0.1 % Apply 1 Application topically 2 (two) times daily.     No current facility-administered medications on file prior to visit.    Family History  Problem Relation Age of Onset   Hypertension Mother    Hypertension  Father    Hyperlipidemia Father    COPD Father    Atrial fibrillation Father    Colon cancer Neg Hx    Rectal cancer Neg Hx    Stomach cancer Neg Hx    Colon polyps Neg Hx    Esophageal cancer Neg Hx     Social History   Socioeconomic History   Marital status: Single    Spouse name: Not on file   Number of children: 3   Years of education: Not on file   Highest education level: Not on file  Occupational History   Not on file  Tobacco Use   Smoking status: Former    Current packs/day: 0.00    Average packs/day: 2.0 packs/day for 38.0 years (76.1 ttl pk-yrs)    Types: Cigarettes    Start date: 12/18/2012    Quit date: 11/21/2013    Years since quitting: 10.1    Passive exposure: Past   Smokeless tobacco: Never  Vaping Use    Vaping status: Never Used  Substance and Sexual Activity   Alcohol use: Not Currently   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   Sexual activity: Not Currently    Partners: Male    Comment: declined condoms  Other Topics Concern   Not on file  Social History Narrative   Not on file   Social Drivers of Health   Financial Resource Strain: Not on file  Food Insecurity: Food Insecurity Present (09/06/2023)   Hunger Vital Sign    Worried About Running Out of Food in the Last Year: Sometimes true    Ran Out of Food in the Last Year: Sometimes true  Transportation Needs: No Transportation Needs (09/06/2023)   PRAPARE - Administrator, Civil Service (Medical): No    Lack of Transportation (Non-Medical): No  Physical Activity: Not on file  Stress: Not on file  Social Connections: Not on file  Intimate Partner Violence: Not At Risk (09/06/2023)   Humiliation, Afraid, Rape, and Kick questionnaire    Fear of Current or Ex-Partner: No    Emotionally Abused: No    Physically Abused: No    Sexually Abused: No    Review of Systems: ROS negative except for what is noted on the assessment and plan.  Vitals:   12/27/23 0934  BP: 130/81  Pulse: 100  Temp: 98 F (36.7 C)  TempSrc: Oral  SpO2: 100%  Height: 5' 3 (1.6 m)    Physical Exam: Constitutional: well-appearing, sitting in chair, in no acute distress Cardiovascular: regular rate and rhythm, no m/r/g Pulmonary/Chest: normal work of breathing on room air, lungs clear to auscultation bilaterally Abdominal: soft, non-tender, non-distended MSK: normal bulk and tone Skin: warm and dry Psych: normal mood and behavior  Assessment & Plan:     Patient discussed with Dr. Forest  Hypertension Stable. -Continue Amlodipine  10/daily  COPD  GOLD 3  Stable.Physical exam unremarkable. Follows with pulmonology. -Continue Albuterol  prn, Symbicort , Flonase     Hyperlipidemia LDL = 86 08/2023. Patient with  history of CVA with LDL goal of < 55.  -Increased Crestor  from 20 to 40 mg/daily. -Repeat Lipid Panel in 6 months  Osteoporosis Patient has a history of osteoporosis with previous 2017 DEXA T score of -2.9. Was started on Alendronate  in the past but unclear how long she took this. Repeat DEXA ordered. Vitamin D  wnl without supplementation.   Norman Lobstein, D.O. [facility] Internal Medicine, PGY-1 Phone: [phone] Date  12/27/2023 Time 11:02 AM

## 2023-12-27 NOTE — Assessment & Plan Note (Signed)
 Patient has a history of osteoporosis with previous 2017 DEXA T score of -2.9. Was started on Alendronate in the past but unclear how long she took this. Repeat DEXA ordered. Vitamin D wnl without supplementation.

## 2023-12-27 NOTE — Assessment & Plan Note (Addendum)
 Stable.Physical exam unremarkable. Follows with pulmonology. -Continue Albuterol prn, Symbicort, Flonase

## 2023-12-27 NOTE — Progress Notes (Signed)
 Specialty Pharmacy Refill Coordination Note  PANG ROBERS is a 63 y.o. female contacted today regarding refills of specialty medication(s) Bictegravir-Emtricitab-Tenofov (Biktarvy )   Patient requested Delivery   Delivery date: 01/07/24   Verified address: [address]   [place] [address]   Medication will be filled on 01/04/24.

## 2023-12-27 NOTE — Patient Instructions (Addendum)
 Thank you for allowing me to be a part of your care team. Today we discussed a few things:  I sent in a referral for a repeat DEXA scan to look at your bone mineral density I sent a prescription for Crestor  40 mg/daily which should help lower your cholesterol to goal.

## 2023-12-27 NOTE — Assessment & Plan Note (Addendum)
 LDL = 86 08/2023. Patient with history of CVA with LDL goal of < 55.  -Increased Crestor from 20 to 40 mg/daily. -Repeat Lipid Panel in 6 months

## 2023-12-28 NOTE — Progress Notes (Signed)
 Internal Medicine Clinic Attending  Case discussed with the resident at the time of the visit.  We reviewed the resident's history and exam and pertinent patient test results.  I agree with the assessment, diagnosis, and plan of care documented in the resident's note.

## 2023-12-28 NOTE — Addendum Note (Signed)
 Addended by: Burnell Blanks on: 12/28/2023 10:13 AM   Modules accepted: Level of Service

## 2023-12-30 LAB — T-HELPER CELLS (CD4) COUNT (NOT AT ARMC)
Absolute CD4: 1125 {cells}/uL (ref 490–1740)
CD4 T Helper %: 43 % (ref 30–61)
Total lymphocyte count: 2628 {cells}/uL (ref 850–3900)

## 2023-12-30 LAB — COMPLETE METABOLIC PANEL WITH GFR
AG Ratio: 1.6 (calc) (ref 1.0–2.5)
ALT: 8 U/L (ref 6–29)
AST: 17 U/L (ref 10–35)
Albumin: 4.8 g/dL (ref 3.6–5.1)
Alkaline phosphatase (APISO): 59 U/L (ref 37–153)
BUN: 14 mg/dL (ref 7–25)
CO2: 26 mmol/L (ref 20–32)
Calcium: 9.9 mg/dL (ref 8.6–10.4)
Chloride: 102 mmol/L (ref 98–110)
Creat: 1.05 mg/dL (ref 0.50–1.05)
Globulin: 3 g/dL (ref 1.9–3.7)
Glucose, Bld: 79 mg/dL (ref 65–99)
Potassium: 4.1 mmol/L (ref 3.5–5.3)
Sodium: 139 mmol/L (ref 135–146)
Total Bilirubin: 0.7 mg/dL (ref 0.2–1.2)
Total Protein: 7.8 g/dL (ref 6.1–8.1)
eGFR: 60 mL/min/{1.73_m2} (ref >=60)

## 2023-12-30 LAB — HIV-1 RNA QUANT-NO REFLEX-BLD
HIV 1 RNA Quant: NOT DETECTED {copies}/mL
HIV-1 RNA Quant, Log: NOT DETECTED {Log_copies}/mL

## 2023-12-30 LAB — CBC WITH DIFFERENTIAL/PLATELET
Absolute Lymphocytes: 2600 {cells}/uL (ref 850–3900)
Absolute Monocytes: 541 {cells}/uL (ref 200–950)
Basophils Absolute: 33 {cells}/uL (ref 0–200)
Basophils Relative: 0.5 %
Eosinophils Absolute: 20 {cells}/uL (ref 15–500)
Eosinophils Relative: 0.3 %
HCT: 36.9 % (ref 35.0–45.0)
Hemoglobin: 12.5 g/dL (ref 11.7–15.5)
MCH: 32.7 pg (ref 27.0–33.0)
MCHC: 33.9 g/dL (ref 32.0–36.0)
MCV: 96.6 fL (ref 80.0–100.0)
MPV: 9.8 fL (ref 7.5–12.5)
Monocytes Relative: 8.2 %
Neutro Abs: 3406 {cells}/uL (ref 1500–7800)
Neutrophils Relative %: 51.6 %
Platelets: 325 10*3/uL (ref 140–400)
RBC: 3.82 10*6/uL (ref 3.80–5.10)
RDW: 12.4 % (ref 11.0–15.0)
Total Lymphocyte: 39.4 %
WBC: 6.6 10*3/uL (ref 3.8–10.8)

## 2023-12-30 LAB — RPR: RPR Ser Ql: NONREACTIVE

## 2023-12-31 DIAGNOSIS — B20 Human immunodeficiency virus [HIV] disease: Secondary | ICD-10-CM | POA: Diagnosis not present

## 2024-01-01 DIAGNOSIS — B20 Human immunodeficiency virus [HIV] disease: Secondary | ICD-10-CM | POA: Diagnosis not present

## 2024-01-02 DIAGNOSIS — B20 Human immunodeficiency virus [HIV] disease: Secondary | ICD-10-CM | POA: Diagnosis not present

## 2024-01-03 DIAGNOSIS — B20 Human immunodeficiency virus [HIV] disease: Secondary | ICD-10-CM | POA: Diagnosis not present

## 2024-01-04 ENCOUNTER — Other Ambulatory Visit: Payer: Self-pay

## 2024-01-04 DIAGNOSIS — J301 Allergic rhinitis due to pollen: Secondary | ICD-10-CM | POA: Diagnosis not present

## 2024-01-04 DIAGNOSIS — J3089 Other allergic rhinitis: Secondary | ICD-10-CM | POA: Diagnosis not present

## 2024-01-04 DIAGNOSIS — J3081 Allergic rhinitis due to animal (cat) (dog) hair and dander: Secondary | ICD-10-CM | POA: Diagnosis not present

## 2024-01-04 DIAGNOSIS — Z7689 Persons encountering health services in other specified circumstances: Secondary | ICD-10-CM | POA: Diagnosis not present

## 2024-01-07 DIAGNOSIS — B20 Human immunodeficiency virus [HIV] disease: Secondary | ICD-10-CM | POA: Diagnosis not present

## 2024-01-08 DIAGNOSIS — B20 Human immunodeficiency virus [HIV] disease: Secondary | ICD-10-CM | POA: Diagnosis not present

## 2024-01-08 DIAGNOSIS — Z7689 Persons encountering health services in other specified circumstances: Secondary | ICD-10-CM | POA: Diagnosis not present

## 2024-01-09 DIAGNOSIS — B20 Human immunodeficiency virus [HIV] disease: Secondary | ICD-10-CM | POA: Diagnosis not present

## 2024-01-10 DIAGNOSIS — B20 Human immunodeficiency virus [HIV] disease: Secondary | ICD-10-CM | POA: Diagnosis not present

## 2024-01-14 DIAGNOSIS — Z7689 Persons encountering health services in other specified circumstances: Secondary | ICD-10-CM | POA: Diagnosis not present

## 2024-01-14 DIAGNOSIS — B20 Human immunodeficiency virus [HIV] disease: Secondary | ICD-10-CM | POA: Diagnosis not present

## 2024-01-15 ENCOUNTER — Telehealth: Payer: Self-pay | Admitting: Student

## 2024-01-15 DIAGNOSIS — B20 Human immunodeficiency virus [HIV] disease: Secondary | ICD-10-CM | POA: Diagnosis not present

## 2024-01-15 DIAGNOSIS — Z1231 Encounter for screening mammogram for malignant neoplasm of breast: Secondary | ICD-10-CM

## 2024-01-15 NOTE — Telephone Encounter (Signed)
Rec'd a call from the pt requesting to Sch her Dexa Scan with [facility].  The pt will also be due for a Yearly Mammogram in April 2025.  Can you please place a new Order for the following as well and their office will reach out to the pt to get her sch.  MM 3D SCREEN BREAST BILATERAL

## 2024-01-16 DIAGNOSIS — B20 Human immunodeficiency virus [HIV] disease: Secondary | ICD-10-CM | POA: Diagnosis not present

## 2024-01-17 DIAGNOSIS — B20 Human immunodeficiency virus [HIV] disease: Secondary | ICD-10-CM | POA: Diagnosis not present

## 2024-01-18 ENCOUNTER — Encounter: Payer: Self-pay | Admitting: Internal Medicine

## 2024-01-18 MED ORDER — TRAMADOL HCL 50 MG PO TABS: 50.0000 mg | ORAL_TABLET | Freq: Four times a day (QID) | ORAL | 0 refills | Status: DC | PRN

## 2024-01-19 DIAGNOSIS — Z419 Encounter for procedure for purposes other than remedying health state, unspecified: Secondary | ICD-10-CM | POA: Diagnosis not present

## 2024-01-21 ENCOUNTER — Other Ambulatory Visit: Payer: Self-pay

## 2024-01-21 DIAGNOSIS — B20 Human immunodeficiency virus [HIV] disease: Secondary | ICD-10-CM | POA: Diagnosis not present

## 2024-01-21 MED ORDER — TRAMADOL HCL 50 MG PO TABS: 50.0000 mg | ORAL_TABLET | Freq: Four times a day (QID) | ORAL | 0 refills | Status: DC | PRN

## 2024-01-21 NOTE — Progress Notes (Signed)
Spoke with [facility] Pharmacist who was able to take verbal.  Juanita Laster, RMA

## 2024-01-22 DIAGNOSIS — B20 Human immunodeficiency virus [HIV] disease: Secondary | ICD-10-CM | POA: Diagnosis not present

## 2024-01-23 ENCOUNTER — Telehealth: Payer: Self-pay

## 2024-01-23 DIAGNOSIS — B20 Human immunodeficiency virus [HIV] disease: Secondary | ICD-10-CM | POA: Diagnosis not present

## 2024-01-23 NOTE — Telephone Encounter (Signed)
PA for Tramadol initiated through [facility].  PA approved and approval faxed to the pharmacy.  Approval 01/22/24-07/20/24 [place] Radu Jonathon Resides, CMA

## 2024-01-24 ENCOUNTER — Other Ambulatory Visit: Payer: Self-pay

## 2024-01-24 DIAGNOSIS — B20 Human immunodeficiency virus [HIV] disease: Secondary | ICD-10-CM | POA: Diagnosis not present

## 2024-01-25 DIAGNOSIS — B20 Human immunodeficiency virus [HIV] disease: Secondary | ICD-10-CM | POA: Diagnosis not present

## 2024-01-25 DIAGNOSIS — J3089 Other allergic rhinitis: Secondary | ICD-10-CM | POA: Diagnosis not present

## 2024-01-25 DIAGNOSIS — J301 Allergic rhinitis due to pollen: Secondary | ICD-10-CM | POA: Diagnosis not present

## 2024-01-28 ENCOUNTER — Ambulatory Visit
Admission: EM | Admit: 2024-01-28 | Discharge: 2024-01-28 | Disposition: A | Discharge status: Home / Self Care | Payer: Medicaid Other

## 2024-01-28 ENCOUNTER — Other Ambulatory Visit: Payer: Self-pay

## 2024-01-28 ENCOUNTER — Ambulatory Visit (INDEPENDENT_AMBULATORY_CARE_PROVIDER_SITE_OTHER): Payer: Medicaid Other

## 2024-01-28 ENCOUNTER — Telehealth: Payer: Self-pay

## 2024-01-28 DIAGNOSIS — J069 Acute upper respiratory infection, unspecified: Secondary | ICD-10-CM

## 2024-01-28 DIAGNOSIS — J019 Acute sinusitis, unspecified: Secondary | ICD-10-CM | POA: Insufficient documentation

## 2024-01-28 DIAGNOSIS — J439 Emphysema, unspecified: Secondary | ICD-10-CM | POA: Diagnosis not present

## 2024-01-28 DIAGNOSIS — R6889 Other general symptoms and signs: Secondary | ICD-10-CM | POA: Diagnosis not present

## 2024-01-28 DIAGNOSIS — Z8701 Personal history of pneumonia (recurrent): Secondary | ICD-10-CM | POA: Insufficient documentation

## 2024-01-28 DIAGNOSIS — R059 Cough, unspecified: Secondary | ICD-10-CM | POA: Diagnosis not present

## 2024-01-28 DIAGNOSIS — B20 Human immunodeficiency virus [HIV] disease: Secondary | ICD-10-CM | POA: Diagnosis not present

## 2024-01-28 MED ORDER — AMOXICILLIN-POT CLAVULANATE 875-125 MG PO TABS: 1.0000 | ORAL_TABLET | Freq: Two times a day (BID) | ORAL | 0 refills | Status: DC

## 2024-01-28 MED ORDER — ONDANSETRON HCL 4 MG PO TABS: 4.0000 mg | ORAL_TABLET | Freq: Three times a day (TID) | ORAL | 0 refills | Status: DC | PRN

## 2024-01-28 MED ORDER — OSELTAMIVIR PHOSPHATE 75 MG PO CAPS: 75.0000 mg | ORAL_CAPSULE | Freq: Two times a day (BID) | ORAL | 0 refills | Status: DC

## 2024-01-28 NOTE — ED Triage Notes (Signed)
"  This started after going to church yesterday, during chest I started having some sinus problems (drainage), when I got out of church, fatigue, chills, body aches and cough I can't get rid of, the cough I was seen for prior is still their and persistent with recently history of Pna". No fever known.

## 2024-01-28 NOTE — ED Provider Notes (Addendum)
 [facility]    CSN: [id number] Arrival date & time: 01/28/24  1133      History   Chief Complaint Chief Complaint  Patient presents with   Chills    HPI Janice Williams is a 63 y.o. female.   Patient here today for evaluation of chest congestion, sinus congestion and pressure, fatigue and chills.  She reports that she was seen prior for cough and did have some mild improvement with treatment but states symptoms have returned.  She states that she has bodyaches and feels as if she might have the flu.  She denies any fever.  She has had her flu vaccine.  Patient does have recent history of pneumonia.  The history is provided by the patient.    Past Medical History:  Diagnosis Date   Anemia    Arthritis    generalized   Blood transfusion without reported diagnosis 2003   Bronchitis    Cataract    COPD (chronic obstructive pulmonary disease) (HCC)    COPD with acute exacerbation (HCC) 06/09/2017   Dental abscess 03/15/2015   DYSPEPSIA 02/14/2007   Qualifier: Diagnosis of  By: Winona Haw     EXTERNAL OTITIS 01/06/2010   Qualifier: Diagnosis of  By: Ferrell Hu MD, Loetta Ringer     FACIAL RASH 02/04/2009   Qualifier: Diagnosis of  By: Pearletha Bouche     Former smoker    quit 2014   GERD (gastroesophageal reflux disease)    Glaucoma    HIV infection (HCC)    Hypertension    Osteoporosis    Seasonal allergies    Stroke (HCC) 2003   Substance abuse (HCC)    history, clean 7 years   SVD (spontaneous vaginal delivery)    x 3    Patient Active Problem List   Diagnosis Date Noted   Vitamin D  deficiency 08/08/2023   Rash of hand 12/21/2022   Former cigarette smoker 11/16/2022   Sprain of calcaneofibular ligament 02/28/2022   Degenerative disc disease, cervical 10/13/2020   Osteoporosis 10/10/2019   COPD  GOLD 3  07/08/2019   Moderate protein-calorie malnutrition (HCC) 05/31/2018   Hypertension 06/08/2017   Hyperlipidemia 06/03/2009   Human immunodeficiency  virus (HIV) disease (HCC) 10/29/2008   GERD 10/29/2008   CEREBROVASCULAR ACCIDENT, HX OF 10/29/2008   Symptoms concerning nutrition, metabolism, and development 02/14/2007   Rhinitis, chronic 02/14/2007    Past Surgical History:  Procedure Laterality Date   COLONOSCOPY  2019   KN-MAC-suprep(adeq)-tics/hems/TA x 5   MULTIPLE TOOTH EXTRACTIONS     with sedation   POLYPECTOMY  01/2008   TA x 5   TONSILLECTOMY  1973   TUBAL LIGATION     UPPER GI ENDOSCOPY     normal per patient - "years ago"    OB History     Gravida  3   Para  3   Term  3   Preterm      AB      Living  3      SAB      IAB      Ectopic      Multiple      Live Births  3            Home Medications    Prior to Admission medications   Medication Sig Start Date End Date Taking? Authorizing Provider  albuterol  (VENTOLIN  HFA) 108 (90 Base) MCG/ACT inhaler Inhale 2 puffs into the lungs every 6 (six) hours as needed  for wheezing or shortness of breath. 02/28/23  Yes Aram Knights, MD  amLODipine  (NORVASC ) 10 MG tablet Take 1 tablet (10 mg total) by mouth daily. 04/02/23 04/01/24 Yes Vita Grip, MD  amoxicillin -clavulanate (AUGMENTIN ) 875-125 MG tablet Take 1 tablet by mouth every 12 (twelve) hours. 01/28/24  Yes Vernestine Gondola, PA-C  bictegravir-emtricitabine -tenofovir  AF (BIKTARVY ) 50-200-25 MG TABS tablet Take 1 tablet by mouth daily. 09/17/23  Yes Liane Redman, MD  levocetirizine (XYZAL ) 5 MG tablet Take 1 tablet (5 mg total) by mouth every evening. 12/26/23  Yes Liane Redman, MD  oseltamivir  (TAMIFLU ) 75 MG capsule Take 1 capsule (75 mg total) by mouth every 12 (twelve) hours. 01/28/24  Yes Vernestine Gondola, PA-C  rosuvastatin  (CRESTOR ) 40 MG tablet Take 1 tablet (40 mg total) by mouth daily. 12/27/23 12/26/24 Yes Ronni Colace, DO  SYMBICORT  160-4.5 MCG/ACT inhaler Inhale 2 puffs into the lungs 2 (two) times daily. 01/13/24  Yes [provider]  Ensure (ENSURE) Take 237 mLs by  mouth 3 (three) times daily between meals. 12/20/23   Liane Redman, MD  EPIPEN  2-PAK 0.3 MG/0.3ML SOAJ injection  03/21/22   [provider]  fluticasone  (FLONASE ) 50 MCG/ACT nasal spray Use 1 spray(s) in each nostril once daily 06/18/23   McLendon, Michael, MD  guaiFENesin -dextromethorphan  (ROBITUSSIN DM) 100-10 MG/5ML syrup Take 5 mLs by mouth every 4 (four) hours as needed for cough. 12/26/23   Liane Redman, MD  omeprazole  (PRILOSEC) 20 MG capsule Take 1 capsule (20 mg total) by mouth daily. 06/29/23   Masters, Alston Jerry, DO  ondansetron  (ZOFRAN ) 4 MG tablet Take 1 tablet (4 mg total) by mouth every 8 (eight) hours as needed for nausea or vomiting. 01/28/24   Vernestine Gondola, PA-C  PRESCRIPTION MEDICATION Inject 1 Dose into the skin once a week. Allergy  shot in office on Fridays    [provider]  RESTASIS 0.05 % ophthalmic emulsion 1 drop 2 (two) times daily. 08/05/21   [provider]  tiotropium (SPIRIVA  HANDIHALER) 18 MCG inhalation capsule Place 1 capsule (18 mcg total) into inhaler and inhale daily. 02/28/23 05/29/23  Aram Knights, MD  traMADol  (ULTRAM ) 50 MG tablet Take 1 tablet (50 mg total) by mouth every 6 (six) hours as needed. 01/21/24   Liane Redman, MD    Family History Family History  Problem Relation Age of Onset   Hypertension Mother    Hypertension Father    Hyperlipidemia Father    COPD Father    Atrial fibrillation Father    Colon cancer Neg Hx    Rectal cancer Neg Hx    Stomach cancer Neg Hx    Colon polyps Neg Hx    Esophageal cancer Neg Hx     Social History Social History   Tobacco Use   Smoking status: Former    Current packs/day: 0.00    Average packs/day: 2.0 packs/day for 38.0 years (76.1 ttl pk-yrs)    Types: Cigarettes    Start date: 12/18/2012    Quit date: 11/21/2013    Years since quitting: 10.1    Passive exposure: Past   Smokeless tobacco: Never  Vaping Use   Vaping status: Never Used  Substance Use Topics   Alcohol  use: Not Currently   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     Allergies   Chantix  [varenicline ]   Review of Systems Review of Systems  Constitutional:  Negative for chills and fever.  HENT:  Positive for  congestion, sinus pressure and sore throat. Negative for ear pain.   Eyes:  Negative for discharge and redness.  Respiratory:  Positive for cough. Negative for shortness of breath and wheezing.   Gastrointestinal:  Positive for nausea. Negative for abdominal pain, diarrhea and vomiting.     Physical Exam Triage Vital Signs ED Triage Vitals  Encounter Vitals Group     BP 01/28/24 1208 137/81     Systolic BP Percentile --      Diastolic BP Percentile --      Pulse Rate 01/28/24 1208 96     Resp 01/28/24 1208 20     Temp 01/28/24 1208 98 F (36.7 C)     Temp Source 01/28/24 1208 Oral     SpO2 01/28/24 1208 94 %     Weight 01/28/24 1206 100 lb (45.4 kg)     Height 01/28/24 1206 5\' 3"  (1.6 m)     Head Circumference --      Peak Flow --      Pain Score 01/28/24 1206 0     Pain Loc --      Pain Education --      Exclude from Growth Chart --    No data found.  Updated Vital Signs BP 137/81 (BP Location: Right Arm)   Pulse 96   Temp 98 F (36.7 C) (Oral)   Resp 20   Ht 5\' 3"  (1.6 m)   Wt 100 lb (45.4 kg)   SpO2 94%   BMI 17.71 kg/m   Visual Acuity Right Eye Distance:   Left Eye Distance:   Bilateral Distance:    Right Eye Near:   Left Eye Near:    Bilateral Near:     Physical Exam Vitals and nursing note reviewed.  Constitutional:      General: She is not in acute distress.    Appearance: Normal appearance. She is not ill-appearing.  HENT:     Head: Normocephalic and atraumatic.     Right Ear: Tympanic membrane normal.     Left Ear: Tympanic membrane normal.     Nose: Congestion present.     Mouth/Throat:     Mouth: Mucous membranes are moist.     Pharynx: No oropharyngeal exudate or posterior oropharyngeal erythema.  Eyes:      Conjunctiva/sclera: Conjunctivae normal.  Cardiovascular:     Rate and Rhythm: Normal rate and regular rhythm.     Heart sounds: Normal heart sounds. No murmur heard. Pulmonary:     Effort: Pulmonary effort is normal. No respiratory distress.     Breath sounds: Normal breath sounds. No wheezing, rhonchi or rales.  Skin:    General: Skin is warm and dry.  Neurological:     Mental Status: She is alert.  Psychiatric:        Mood and Affect: Mood normal.        Thought Content: Thought content normal.      UC Treatments / Results  Labs (all labs ordered are listed, but only abnormal results are displayed) Labs Reviewed  SARS CORONAVIRUS 2 (TAT 6-24 HRS)    EKG   Radiology DG Chest 2 View Result Date: 01/28/2024 CLINICAL DATA:  Cough.  Pneumonia.  Immunocompromised. EXAM: CHEST - 2 VIEW COMPARISON:  Chest CT 06/27/2023. Radiographs 05/30/2023 and 12/12/2021. FINDINGS: The heart size and mediastinal contours are stable. Grossly stable changes of emphysema with hyperinflation and diffuse central airway thickening. No superimposed airspace disease, pleural effusion or pneumothorax. The bones appear unchanged.  IMPRESSION: Stable chest with emphysema and diffuse central airway thickening. No evidence of pneumonia. Electronically Signed   By: Elmon Hagedorn M.D.   On: 01/28/2024 14:34    Procedures Procedures (including critical care time)  Medications Ordered in UC Medications - No data to display  Initial Impression / Assessment and Plan / UC Course  I have reviewed the triage vital signs and the nursing notes.  Pertinent labs & imaging results that were available during my care of the patient were reviewed by me and considered in my medical decision making (see chart for details).    Chest x-ray without acute findings.  Given continued sinus pressure we will treat to cover possible sinusitis but discussed possibility of viral etiology as well.  Given flulike symptoms we will  treat for Tamiflu , unable to test for same given lack of supplies.  Will also screen for COVID.  Zofran  prescribed for nausea.  Advised follow-up if no gradual improvement or with any further concerns.  Final Clinical Impressions(s) / UC Diagnoses   Final diagnoses:  Acute sinusitis, recurrence not specified, unspecified location  Flu-like symptoms  Acute upper respiratory infection   Discharge Instructions   None    ED Prescriptions     Medication Sig Dispense Auth. Provider   amoxicillin -clavulanate (AUGMENTIN ) 875-125 MG tablet Take 1 tablet by mouth every 12 (twelve) hours. 14 tablet Vernestine Gondola, PA-C   oseltamivir  (TAMIFLU ) 75 MG capsule Take 1 capsule (75 mg total) by mouth every 12 (twelve) hours. 10 capsule Vernestine Gondola, PA-C   ondansetron  (ZOFRAN ) 4 MG tablet Take 1 tablet (4 mg total) by mouth every 8 (eight) hours as needed for nausea or vomiting. 20 tablet Vernestine Gondola, PA-C      PDMP not reviewed this encounter.   Vernestine Gondola, PA-C 01/28/24 1517    Vernestine Gondola, PA-C 01/28/24 1517

## 2024-01-28 NOTE — ED Notes (Signed)
 Vital signs stable. Patient resting and waiting on provider.

## 2024-01-28 NOTE — Telephone Encounter (Signed)
 Patient called, reports she thinks she has the flu. Endorses chills and hot flashes. Advised that Dr. Levern Reader is not in the office today and encouraged her to reach out to PCP to see if they are able to see her today. If not, recommended urgent care and encouraged her to schedule online to cut down on wait time. Patient verbalized understanding and has no further questions.   Brianna Esson D Janelis Stelzer, RN

## 2024-01-28 NOTE — ED Notes (Addendum)
 Verbal Order Completed: Provider unable to access a computer creating unreasonable inconvenience to the ordering provider with possible delay in patient care. Acknowledged by Polly Brink & Provider. Repeated Verbal order by Polly Brink Provider.

## 2024-01-29 DIAGNOSIS — B20 Human immunodeficiency virus [HIV] disease: Secondary | ICD-10-CM | POA: Diagnosis not present

## 2024-01-29 LAB — SARS CORONAVIRUS 2 (TAT 6-24 HRS): SARS Coronavirus 2: NEGATIVE

## 2024-01-30 ENCOUNTER — Other Ambulatory Visit: Payer: Self-pay

## 2024-01-30 DIAGNOSIS — B20 Human immunodeficiency virus [HIV] disease: Secondary | ICD-10-CM | POA: Diagnosis not present

## 2024-01-31 ENCOUNTER — Other Ambulatory Visit: Payer: Self-pay

## 2024-01-31 ENCOUNTER — Other Ambulatory Visit: Payer: Self-pay | Admitting: Pharmacist

## 2024-01-31 ENCOUNTER — Other Ambulatory Visit (HOSPITAL_COMMUNITY): Payer: Self-pay

## 2024-01-31 DIAGNOSIS — B20 Human immunodeficiency virus [HIV] disease: Secondary | ICD-10-CM | POA: Diagnosis not present

## 2024-01-31 NOTE — Progress Notes (Signed)
Specialty Pharmacy Ongoing Clinical Assessment Note  Janice Williams is a 63 y.o. female who is being followed by the specialty pharmacy service for RxSp HIV   Patient's specialty medication(s) reviewed today: Bictegravir-Emtricitab-Tenofov (Biktarvy)   Missed doses in the last 4 weeks: 0   Patient/Caregiver did not have any additional questions or concerns.   Therapeutic benefit summary: Patient is achieving benefit   Adverse events/side effects summary: No adverse events/side effects   Patient's therapy is appropriate to: Continue    Goals Addressed             This Visit's Progress    Achieve Undetectable HIV Viral Load < 20       Patient is on track. Patient will maintain adherence         Follow up:  6 months  Jennette Kettle Specialty Pharmacist

## 2024-01-31 NOTE — Progress Notes (Signed)
Specialty Pharmacy Refill Coordination Note  Janice Williams is a 63 y.o. female contacted today regarding refills of specialty medication(s) Bictegravir-Emtricitab-Tenofov Musician)   Patient requested Delivery   Delivery date: 02/05/24   Verified address: [address] [place] [address]   Medication will be filled on 02/04/24.

## 2024-02-01 DIAGNOSIS — B20 Human immunodeficiency virus [HIV] disease: Secondary | ICD-10-CM | POA: Diagnosis not present

## 2024-02-04 ENCOUNTER — Other Ambulatory Visit: Payer: Self-pay

## 2024-02-04 DIAGNOSIS — B20 Human immunodeficiency virus [HIV] disease: Secondary | ICD-10-CM | POA: Diagnosis not present

## 2024-02-05 DIAGNOSIS — B20 Human immunodeficiency virus [HIV] disease: Secondary | ICD-10-CM | POA: Diagnosis not present

## 2024-02-06 DIAGNOSIS — B20 Human immunodeficiency virus [HIV] disease: Secondary | ICD-10-CM | POA: Diagnosis not present

## 2024-02-07 DIAGNOSIS — B20 Human immunodeficiency virus [HIV] disease: Secondary | ICD-10-CM | POA: Diagnosis not present

## 2024-02-08 DIAGNOSIS — B20 Human immunodeficiency virus [HIV] disease: Secondary | ICD-10-CM | POA: Diagnosis not present

## 2024-02-11 DIAGNOSIS — B20 Human immunodeficiency virus [HIV] disease: Secondary | ICD-10-CM | POA: Diagnosis not present

## 2024-02-12 ENCOUNTER — Ambulatory Visit (INDEPENDENT_AMBULATORY_CARE_PROVIDER_SITE_OTHER): Payer: Medicaid Other | Admitting: Student

## 2024-02-12 VITALS — BP 138/88 | HR 108 | Temp 98.3°F | Ht 63.0 in | Wt 95.0 lb

## 2024-02-12 DIAGNOSIS — J441 Chronic obstructive pulmonary disease with (acute) exacerbation: Secondary | ICD-10-CM | POA: Diagnosis not present

## 2024-02-12 DIAGNOSIS — J449 Chronic obstructive pulmonary disease, unspecified: Secondary | ICD-10-CM

## 2024-02-12 DIAGNOSIS — B20 Human immunodeficiency virus [HIV] disease: Secondary | ICD-10-CM | POA: Diagnosis not present

## 2024-02-12 DIAGNOSIS — Z7689 Persons encountering health services in other specified circumstances: Secondary | ICD-10-CM | POA: Diagnosis not present

## 2024-02-12 MED ORDER — SPIRIVA RESPIMAT 2.5 MCG/ACT IN AERS: 2.0000 | INHALATION_SPRAY | Freq: Every day | RESPIRATORY_TRACT | 11 refills | Status: DC

## 2024-02-12 MED ORDER — PREDNISONE 20 MG PO TABS: 40.0000 mg | ORAL_TABLET | Freq: Every day | ORAL | 0 refills | Status: DC

## 2024-02-12 NOTE — Patient Instructions (Signed)
  Thank you, Ms.[name], for allowing [place] to provide your care today. Today we discussed . . .  > COPD exacerbation       -We are going to treat you for a COPD exacerbation with a steroid called prednisone 40 mg daily for 5 days and I have replaced her Spiriva with a form that is an inhaler and not a dry powder.  Please continue to take your Symbicort and take the new Spiriva 2 puffs daily.  Please also continue to use your albuterol as needed.  If you have any worsening symptoms especially more shortness of breath please let us know.  Please continue to take your Flonase and Xyzal.  Overall I do not think you have a pneumonia so I do not think more antibiotics are needed right now.   I have ordered the following medication/changed the following medications:   Stop the following medications: Medications Discontinued During This Encounter  Medication Reason   tiotropium (SPIRIVA HANDIHALER) 18 MCG inhalation capsule      Start the following medications: Meds ordered this encounter  Medications   Tiotropium Bromide Monohydrate (SPIRIVA RESPIMAT) 2.5 MCG/ACT AERS    Sig: Inhale 2 puffs into the lungs daily.    Dispense:  4 g    Refill:  11   predniSONE (DELTASONE) 20 MG tablet    Sig: Take 2 tablets (40 mg total) by mouth daily with breakfast for 5 days.    Dispense:  10 tablet    Refill:  0      Follow up: 4-6 months    Remember:     Should you have any questions or concerns please call the internal medicine clinic at [phone].     Rocky Morel, DO [facility]

## 2024-02-12 NOTE — Progress Notes (Signed)
   CC: Acute Concern of sinus congestion, fatigue, and cough seen at Urgent Care on 01/28/2024  HPI:  Janice Williams is a 63 y.o. female with pertinent PMH of HTN, COPD, well controlled HIV, and GERD  who presents as above. Please see assessment and plan below for further details.  Review of Systems:   Pertinent items noted in HPI and/or A&P.  Physical Exam:  Vitals:   02/12/24 0840  BP: 138/88  Pulse: (!) 108  Temp: 98.3 F (36.8 C)  TempSrc: Oral  SpO2: 100%  Weight: 95 lb (43.1 kg)  Height: 5\' 3"  (1.6 m)    Constitutional: Tired appearing elderly female. In no acute distress. HEENT: Normocephalic, atraumatic, Sclera non-icteric, PERRL, EOM intact, oropharynx clear Cardio:Regular rate and rhythm. 2+ bilateral radial pulses. Pulm: Decreased breath sounds diffusely with mild expiratory wheezing. Normal work of breathing on room air. HQI:ONGEXBMW for extremity edema. Skin:Warm and dry. Neuro:Alert and oriented x3. No focal deficit noted. Psych:Pleasant mood and affect.   Assessment & Plan:   COPD  GOLD 3  Patient comes in with acute complaints of 1 month of bothersome but stable dyspnea, productive cough with green sputum, and recent worsening of upper respiratory tract infection symptoms with bothersome sinus congestion, fatigue, myalgias, and nausea without vomiting and no decreased oral intake.  Vitals are stable with good oxygen saturations on room air.  On exam there is mild diffuse wheezing lungs.  She has not been taking her Spiriva due to it being a dry powder but has been getting benefit from her as needed albuterol inhaler.  Overall consistent with mild COPD exacerbation and viral upper respiratory tract infection which she recently completed 7 days of Augmentin and Tamiflu without significant relief.  Low suspicion for bacterial pneumonia.  She also has adverse effects from steroids as mainly tremors and does not want to take any prednisone.  She does have a history of  HIV but this is well-controlled with her last CD4 count 1125 1 month ago. - Switch Spiriva from dry powder to Spiriva Respimat 2 puffs daily - Tylenol 1000 mg every 8 hours as needed, counseled on maximum dose of 3 g of Tylenol daily - Continue Flonase (counseled on proper technique) and daily antihistamine - Return precautions discussed    Patient discussed with Dr. Virl Cagey, DO [facility] Internal Medicine Resident PGY-2 Clinic Phone: [phone] Pager: [phone]

## 2024-02-12 NOTE — Assessment & Plan Note (Addendum)
 Patient comes in with acute complaints of 1 month of bothersome but stable dyspnea, productive cough with green sputum, and recent worsening of upper respiratory tract infection symptoms with bothersome sinus congestion, fatigue, myalgias, and nausea without vomiting and no decreased oral intake.  Vitals are stable with good oxygen saturations on room air.  On exam there is mild diffuse wheezing lungs.  She has not been taking her Spiriva due to it being a dry powder but has been getting benefit from her as needed albuterol inhaler.  Overall consistent with mild COPD exacerbation and viral upper respiratory tract infection which she recently completed 7 days of Augmentin and Tamiflu without significant relief.  Low suspicion for bacterial pneumonia.  She also has adverse effects from steroids as mainly tremors and does not want to take any prednisone.  She does have a history of HIV but this is well-controlled with her last CD4 count 1125 1 month ago. - Switch Spiriva from dry powder to Spiriva Respimat 2 puffs daily - Tylenol 1000 mg every 8 hours as needed, counseled on maximum dose of 3 g of Tylenol daily - Continue Flonase (counseled on proper technique) and daily antihistamine - Return precautions discussed

## 2024-02-13 ENCOUNTER — Telehealth: Payer: Self-pay

## 2024-02-13 ENCOUNTER — Other Ambulatory Visit: Payer: Self-pay | Admitting: Student

## 2024-02-13 DIAGNOSIS — Z7689 Persons encountering health services in other specified circumstances: Secondary | ICD-10-CM | POA: Diagnosis not present

## 2024-02-13 DIAGNOSIS — H0102B Squamous blepharitis left eye, upper and lower eyelids: Secondary | ICD-10-CM | POA: Diagnosis not present

## 2024-02-13 DIAGNOSIS — B20 Human immunodeficiency virus [HIV] disease: Secondary | ICD-10-CM | POA: Diagnosis not present

## 2024-02-13 DIAGNOSIS — K219 Gastro-esophageal reflux disease without esophagitis: Secondary | ICD-10-CM

## 2024-02-13 DIAGNOSIS — H0102A Squamous blepharitis right eye, upper and lower eyelids: Secondary | ICD-10-CM | POA: Diagnosis not present

## 2024-02-13 MED ORDER — OMEPRAZOLE 20 MG PO CPDR: 20.0000 mg | DELAYED_RELEASE_CAPSULE | Freq: Every day | ORAL | 3 refills | Status: DC

## 2024-02-13 NOTE — Telephone Encounter (Signed)
 Pt called this AM  requesting to speak to Dr Geraldo Pitter who she had an appt with yesterday 2/25 .[name] Call was regarding her pain .... Message was sent to  Dr Geraldo Pitter via secure chat  and call transferred for him to speak to her  with his opinion  .[name]    Question was as follows : good morning pt is on my line .. she stated that yesterday she was told to take tylenol for her pain but after 8 hours the pain returns she is wanting to know if she can also take ibuprofen with the tylenol or after the pain returns

## 2024-02-13 NOTE — Progress Notes (Signed)
 Internal Medicine Clinic Attending  Case discussed with the resident at the time of the visit.  We reviewed the resident's history and exam and pertinent patient test results.  I agree with the assessment, diagnosis, and plan of care documented in the resident's note.

## 2024-02-13 NOTE — Addendum Note (Signed)
 Addended by: Rocky Morel on: 02/13/2024 08:41 AM   Modules accepted: Orders

## 2024-02-14 DIAGNOSIS — B20 Human immunodeficiency virus [HIV] disease: Secondary | ICD-10-CM | POA: Diagnosis not present

## 2024-02-15 DIAGNOSIS — B20 Human immunodeficiency virus [HIV] disease: Secondary | ICD-10-CM | POA: Diagnosis not present

## 2024-02-16 DIAGNOSIS — Z419 Encounter for procedure for purposes other than remedying health state, unspecified: Secondary | ICD-10-CM | POA: Diagnosis not present

## 2024-02-18 ENCOUNTER — Other Ambulatory Visit: Payer: Self-pay | Admitting: Internal Medicine

## 2024-02-18 ENCOUNTER — Telehealth: Payer: Self-pay

## 2024-02-18 DIAGNOSIS — B20 Human immunodeficiency virus [HIV] disease: Secondary | ICD-10-CM | POA: Diagnosis not present

## 2024-02-18 MED ORDER — TRAMADOL HCL 50 MG PO TABS: 50.0000 mg | ORAL_TABLET | Freq: Four times a day (QID) | ORAL | 0 refills | Status: DC | PRN

## 2024-02-18 NOTE — Telephone Encounter (Signed)
 Patient called stating she just got off the phone with pharmacy regarding his Tramadol refill and was told they do not have refill on file. Informed pt that this was called in this morning and will take longer for them to process.  Requested I call pharmacy to ensure they do have prescription. Spoke with pharmacist who confirmed he received call this morning. Will have medication refill ready after lunch.  Updated patient.  Juanita Laster, RMA

## 2024-02-18 NOTE — Telephone Encounter (Signed)
 Patient called requesting refill of tramadol. Okay to refill per Dr. Drue Second.   Refill called into [facility] on [address].   Sandie Ano, RN

## 2024-02-19 DIAGNOSIS — B20 Human immunodeficiency virus [HIV] disease: Secondary | ICD-10-CM | POA: Diagnosis not present

## 2024-02-20 DIAGNOSIS — B20 Human immunodeficiency virus [HIV] disease: Secondary | ICD-10-CM | POA: Diagnosis not present

## 2024-02-21 ENCOUNTER — Encounter: Payer: Self-pay | Admitting: Internal Medicine

## 2024-02-21 ENCOUNTER — Other Ambulatory Visit: Payer: Self-pay

## 2024-02-21 DIAGNOSIS — H0102A Squamous blepharitis right eye, upper and lower eyelids: Secondary | ICD-10-CM | POA: Diagnosis not present

## 2024-02-21 DIAGNOSIS — Z7689 Persons encountering health services in other specified circumstances: Secondary | ICD-10-CM | POA: Diagnosis not present

## 2024-02-21 DIAGNOSIS — H0102B Squamous blepharitis left eye, upper and lower eyelids: Secondary | ICD-10-CM | POA: Diagnosis not present

## 2024-02-21 DIAGNOSIS — H5713 Ocular pain, bilateral: Secondary | ICD-10-CM | POA: Diagnosis not present

## 2024-02-21 DIAGNOSIS — B20 Human immunodeficiency virus [HIV] disease: Secondary | ICD-10-CM | POA: Diagnosis not present

## 2024-02-22 DIAGNOSIS — B20 Human immunodeficiency virus [HIV] disease: Secondary | ICD-10-CM | POA: Diagnosis not present

## 2024-02-25 ENCOUNTER — Other Ambulatory Visit (HOSPITAL_COMMUNITY): Payer: Self-pay

## 2024-02-25 DIAGNOSIS — H0102A Squamous blepharitis right eye, upper and lower eyelids: Secondary | ICD-10-CM | POA: Diagnosis not present

## 2024-02-25 DIAGNOSIS — H0102B Squamous blepharitis left eye, upper and lower eyelids: Secondary | ICD-10-CM | POA: Diagnosis not present

## 2024-02-25 DIAGNOSIS — B20 Human immunodeficiency virus [HIV] disease: Secondary | ICD-10-CM | POA: Diagnosis not present

## 2024-02-25 DIAGNOSIS — H18513 Endothelial corneal dystrophy, bilateral: Secondary | ICD-10-CM | POA: Diagnosis not present

## 2024-02-25 DIAGNOSIS — H5713 Ocular pain, bilateral: Secondary | ICD-10-CM | POA: Diagnosis not present

## 2024-02-25 DIAGNOSIS — H40013 Open angle with borderline findings, low risk, bilateral: Secondary | ICD-10-CM | POA: Diagnosis not present

## 2024-02-25 DIAGNOSIS — Z7689 Persons encountering health services in other specified circumstances: Secondary | ICD-10-CM | POA: Diagnosis not present

## 2024-02-26 DIAGNOSIS — B20 Human immunodeficiency virus [HIV] disease: Secondary | ICD-10-CM | POA: Diagnosis not present

## 2024-02-27 ENCOUNTER — Other Ambulatory Visit (HOSPITAL_COMMUNITY): Payer: Self-pay

## 2024-02-27 ENCOUNTER — Other Ambulatory Visit: Payer: Self-pay

## 2024-02-27 ENCOUNTER — Ambulatory Visit: Payer: Medicaid Other | Admitting: Internal Medicine

## 2024-02-27 VITALS — BP 126/85 | HR 75 | Temp 97.6°F | Wt 93.2 lb

## 2024-02-27 DIAGNOSIS — B20 Human immunodeficiency virus [HIV] disease: Secondary | ICD-10-CM | POA: Diagnosis not present

## 2024-02-27 DIAGNOSIS — Z79899 Other long term (current) drug therapy: Secondary | ICD-10-CM | POA: Diagnosis not present

## 2024-02-27 DIAGNOSIS — R634 Abnormal weight loss: Secondary | ICD-10-CM | POA: Diagnosis not present

## 2024-02-27 DIAGNOSIS — Z7689 Persons encountering health services in other specified circumstances: Secondary | ICD-10-CM | POA: Diagnosis not present

## 2024-02-27 DIAGNOSIS — E43 Unspecified severe protein-calorie malnutrition: Secondary | ICD-10-CM | POA: Diagnosis not present

## 2024-02-27 DIAGNOSIS — J0100 Acute maxillary sinusitis, unspecified: Secondary | ICD-10-CM

## 2024-02-27 MED ORDER — AMOXICILLIN-POT CLAVULANATE 875-125 MG PO TABS: 1.0000 | ORAL_TABLET | Freq: Two times a day (BID) | ORAL | 0 refills | Status: DC

## 2024-02-27 MED ORDER — BIKTARVY 50-200-25 MG PO TABS
1.0000 | ORAL_TABLET | Freq: Every day | ORAL | 5 refills | Status: DC
Start: 2024-02-27 — End: 2024-08-28
  Filled 2024-02-27 – 2024-03-03 (×2): qty 30, 30d supply, fill #0
  Filled 2024-04-09: qty 30, 30d supply, fill #1
  Filled 2024-05-07: qty 30, 30d supply, fill #2
  Filled 2024-06-09: qty 30, 30d supply, fill #3
  Filled 2024-06-30: qty 30, 30d supply, fill #4
  Filled 2024-08-04: qty 30, 30d supply, fill #5

## 2024-02-27 NOTE — Progress Notes (Signed)
 Patient ID: Janice Williams, female   DOB: 1961-05-25, 63 y.o.   MRN: [id number]  HPI Janice Williams is a 63yo F with well controlled hiv disease, currently on biktarvy, not missing doses. Most recently having worsening Nasal congestion/pain  Outpatient Encounter Medications as of 02/27/2024  Medication Sig   albuterol (VENTOLIN HFA) 108 (90 Base) MCG/ACT inhaler Inhale 2 puffs into the lungs every 6 (six) hours as needed for wheezing or shortness of breath.   amLODipine (NORVASC) 10 MG tablet Take 1 tablet (10 mg total) by mouth daily.   bictegravir-emtricitabine-tenofovir AF (BIKTARVY) 50-200-25 MG TABS tablet Take 1 tablet by mouth daily.   Ensure (ENSURE) Take 237 mLs by mouth 3 (three) times daily between meals.   EPIPEN 2-PAK 0.3 MG/0.3ML SOAJ injection    fluticasone (FLONASE) 50 MCG/ACT nasal spray Use 1 spray(s) in each nostril once daily   guaiFENesin-dextromethorphan (ROBITUSSIN DM) 100-10 MG/5ML syrup Take 5 mLs by mouth every 4 (four) hours as needed for cough.   levocetirizine (XYZAL) 5 MG tablet Take 1 tablet (5 mg total) by mouth every evening.   MIEBO 1.338 GM/ML SOLN    omeprazole (PRILOSEC) 20 MG capsule Take 1 capsule (20 mg total) by mouth daily.   ondansetron (ZOFRAN) 4 MG tablet Take 1 tablet (4 mg total) by mouth every 8 (eight) hours as needed for nausea or vomiting.   oseltamivir (TAMIFLU) 75 MG capsule Take 1 capsule (75 mg total) by mouth every 12 (twelve) hours.   PRESCRIPTION MEDICATION Inject 1 Dose into the skin once a week. Allergy shot in office on Fridays   RESTASIS 0.05 % ophthalmic emulsion 1 drop 2 (two) times daily.   rosuvastatin (CRESTOR) 40 MG tablet Take 1 tablet (40 mg total) by mouth daily.   SYMBICORT 160-4.5 MCG/ACT inhaler Inhale 2 puffs into the lungs 2 (two) times daily.   Tiotropium Bromide Monohydrate (SPIRIVA RESPIMAT) 2.5 MCG/ACT AERS Inhale 2 puffs into the lungs daily.   traMADol (ULTRAM) 50 MG tablet Take 1 tablet (50 mg total) by mouth  every 6 (six) hours as needed.   TYRVAYA 0.03 MG/ACT SOLN    amoxicillin-clavulanate (AUGMENTIN) 875-125 MG tablet Take 1 tablet by mouth every 12 (twelve) hours. (Patient not taking: Reported on 02/27/2024)   No facility-administered encounter medications on file as of 02/27/2024.     Patient Active Problem List   Diagnosis Date Noted   Vitamin D deficiency 08/08/2023   Rash of hand 12/21/2022   Former cigarette smoker 11/16/2022   Sprain of calcaneofibular ligament 02/28/2022   Degenerative disc disease, cervical 10/13/2020   Osteoporosis 10/10/2019   COPD  GOLD 3  07/08/2019   Moderate protein-calorie malnutrition (HCC) 05/31/2018   Hypertension 06/08/2017   Hyperlipidemia 06/03/2009   Human immunodeficiency virus (HIV) disease (HCC) 10/29/2008   GERD 10/29/2008   CEREBROVASCULAR ACCIDENT, HX OF 10/29/2008   Symptoms concerning nutrition, metabolism, and development 02/14/2007   Rhinitis, chronic 02/14/2007     Health Maintenance Due  Topic Date Due   Zoster Vaccines- Shingrix (1 of 2) Never done     Review of Systems  Physical Exam   BP 126/85   Pulse 75   Temp 97.6 F (36.4 C) (Oral)   Wt 93 lb 3.2 oz (42.3 kg)   SpO2 96%   BMI 16.51 kg/m    Lab Results  Component Value Date   CD4TCELL 43 12/26/2023   Lab Results  Component Value Date   CD4TABS 880 09/06/2023   CD4TABS  674 01/15/2023   CD4TABS 899 08/01/2021   Lab Results  Component Value Date   HIV1RNAQUANT Not Detected 12/26/2023   Lab Results  Component Value Date   HEPBSAB NEG 12/28/2014   Lab Results  Component Value Date   LABRPR NON-REACTIVE 12/26/2023    CBC Lab Results  Component Value Date   WBC 6.6 12/26/2023   RBC 3.82 12/26/2023   HGB 12.5 12/26/2023   HCT 36.9 12/26/2023   PLT 325 12/26/2023   MCV 96.6 12/26/2023   MCH 32.7 12/26/2023   MCHC 33.9 12/26/2023   RDW 12.4 12/26/2023   LYMPHSABS 2,314 09/06/2023   MONOABS 0.6 06/28/2022   EOSABS 20 12/26/2023     BMET Lab Results  Component Value Date   NA 139 12/26/2023   K 4.1 12/26/2023   CL 102 12/26/2023   CO2 26 12/26/2023   GLUCOSE 79 12/26/2023   BUN 14 12/26/2023   CREATININE 1.05 12/26/2023   CALCIUM 9.9 12/26/2023   GFRNONAA 52 (L) 06/28/2022   GFRAA 65 03/07/2021      Assessment and Plan  HIV disease = continue with biktarvy.  Long term medication = creatinine stable  Health maintenance = Pcv20 today  Short course of amox/clav for sinusitis; sees ENT tomorrow for eval  Underweight/ severe protein calorie malnutrition= Megace tabs not working as well. Remeron has drug interaction with tramadol thus won't prescribe

## 2024-02-28 DIAGNOSIS — R208 Other disturbances of skin sensation: Secondary | ICD-10-CM | POA: Diagnosis not present

## 2024-02-28 DIAGNOSIS — Z7689 Persons encountering health services in other specified circumstances: Secondary | ICD-10-CM | POA: Diagnosis not present

## 2024-02-28 DIAGNOSIS — B20 Human immunodeficiency virus [HIV] disease: Secondary | ICD-10-CM | POA: Diagnosis not present

## 2024-02-28 DIAGNOSIS — H903 Sensorineural hearing loss, bilateral: Secondary | ICD-10-CM | POA: Diagnosis not present

## 2024-02-28 DIAGNOSIS — J302 Other seasonal allergic rhinitis: Secondary | ICD-10-CM | POA: Diagnosis not present

## 2024-02-29 ENCOUNTER — Other Ambulatory Visit (HOSPITAL_COMMUNITY): Payer: Self-pay

## 2024-02-29 DIAGNOSIS — B20 Human immunodeficiency virus [HIV] disease: Secondary | ICD-10-CM | POA: Diagnosis not present

## 2024-03-03 ENCOUNTER — Other Ambulatory Visit: Payer: Self-pay

## 2024-03-03 DIAGNOSIS — B20 Human immunodeficiency virus [HIV] disease: Secondary | ICD-10-CM | POA: Diagnosis not present

## 2024-03-03 NOTE — Progress Notes (Signed)
 Specialty Pharmacy Refill Coordination Note  Janice Williams is a 63 y.o. female contacted today regarding refills of specialty medication(s) Bictegravir-Emtricitab-Tenofov Musician)   Patient requested Delivery   Delivery date: 03/11/24   Verified address: [address] [place] [address]   Medication will be filled on 03.24.25.

## 2024-03-04 DIAGNOSIS — B20 Human immunodeficiency virus [HIV] disease: Secondary | ICD-10-CM | POA: Diagnosis not present

## 2024-03-05 DIAGNOSIS — B20 Human immunodeficiency virus [HIV] disease: Secondary | ICD-10-CM | POA: Diagnosis not present

## 2024-03-06 DIAGNOSIS — B20 Human immunodeficiency virus [HIV] disease: Secondary | ICD-10-CM | POA: Diagnosis not present

## 2024-03-07 DIAGNOSIS — B20 Human immunodeficiency virus [HIV] disease: Secondary | ICD-10-CM | POA: Diagnosis not present

## 2024-03-10 ENCOUNTER — Other Ambulatory Visit (HOSPITAL_COMMUNITY): Payer: Self-pay

## 2024-03-10 DIAGNOSIS — B20 Human immunodeficiency virus [HIV] disease: Secondary | ICD-10-CM | POA: Diagnosis not present

## 2024-03-11 DIAGNOSIS — B20 Human immunodeficiency virus [HIV] disease: Secondary | ICD-10-CM | POA: Diagnosis not present

## 2024-03-12 ENCOUNTER — Telehealth: Payer: Self-pay

## 2024-03-12 DIAGNOSIS — B20 Human immunodeficiency virus [HIV] disease: Secondary | ICD-10-CM | POA: Diagnosis not present

## 2024-03-12 NOTE — Telephone Encounter (Signed)
 Patient called stating that she has transportation paperwork that she needs filled out by Dr.Snider. informed her Dr.Snider wouldn't be in the office this week but we can place paperwork in her box to be filled out when she comes back. Patient voiced her understanding.    Gurneet Matarese Lesli Albee, CMA

## 2024-03-13 ENCOUNTER — Telehealth: Payer: Self-pay | Admitting: Student

## 2024-03-13 ENCOUNTER — Telehealth: Payer: Self-pay | Admitting: *Deleted

## 2024-03-13 ENCOUNTER — Other Ambulatory Visit: Payer: Self-pay | Admitting: Internal Medicine

## 2024-03-13 ENCOUNTER — Ambulatory Visit: Payer: Self-pay

## 2024-03-13 NOTE — Telephone Encounter (Signed)
 Copied from CRM [id number]. Topic: Clinical - Prescription Issue >> Mar 13, 2024 11:55 AM Tiffany H wrote: Reason for CRM: Patient called to request an early refill on Tiotropium Bromide Monohydrate (SPIRIVA RESPIMAT) 2.5 MCG/ACT AERS [[id number]].   Patient advised that she had trouble using the inhaler and a good bit of it has wasted over the past month. Pharmacy advised that patient that the refill was too early and would need MD/DO override to process.   According to Meds List, patient was written a prescription including 11 refills. Please reach out to pharmacy/insurance.  Pharmacy:   [id number] [place], [place] - [address] [place] [address] Phone: [phone]  Fax: [phone] DEA #: [id number] DAW Reason: --

## 2024-03-13 NOTE — Telephone Encounter (Signed)
 1st attempt- Attempted to call to triage wheezing, received "call cannot be completed as dialed". Will continue to attempt. Chart review shows refills of Spiriva available.     Copied from CRM [id number]. Topic: Clinical - Pink Word Triage >> Mar 13, 2024 11:59 AM Tiffany H wrote: Reason for Triage: Patient initially called in for a refill but indicated that due to inability to use inhaler, she has been without Spiriva for 4 days. Patient advised that she's starting to wheeze.    Please reach out to patient. An early refill request has been sent to [facility].

## 2024-03-13 NOTE — Telephone Encounter (Signed)
 2nd attempt, call could not be completed as dialed.

## 2024-03-13 NOTE — Telephone Encounter (Signed)
 Copied from CRM [id number]. Topic: Clinical - Pink Word Triage >> Mar 13, 2024 11:59 AM Tiffany H wrote: Reason for Triage: Patient initially called in for a refill but indicated that due to inability to use inhaler, she has been without Spiriva for 4 days. Patient advised that she's starting to wheeze.   Please reach out to patient. An early refill request has been sent to [facility].

## 2024-03-13 NOTE — Telephone Encounter (Signed)
 Messaged Dr. Drue Second about rx request

## 2024-03-13 NOTE — Telephone Encounter (Signed)
 Please see triage encounter.

## 2024-03-13 NOTE — Telephone Encounter (Signed)
 Unable to reach patient after 3 attempts by E2C2 NT, routing to the provider for resolution per protocol.

## 2024-03-17 DIAGNOSIS — B20 Human immunodeficiency virus [HIV] disease: Secondary | ICD-10-CM | POA: Diagnosis not present

## 2024-03-18 ENCOUNTER — Other Ambulatory Visit: Payer: Self-pay | Admitting: Internal Medicine

## 2024-03-18 DIAGNOSIS — B20 Human immunodeficiency virus [HIV] disease: Secondary | ICD-10-CM | POA: Diagnosis not present

## 2024-03-18 MED ORDER — TRAMADOL HCL 50 MG PO TABS: 50.0000 mg | ORAL_TABLET | Freq: Four times a day (QID) | ORAL | 1 refills | Status: DC | PRN

## 2024-03-19 ENCOUNTER — Other Ambulatory Visit: Payer: Self-pay

## 2024-03-19 ENCOUNTER — Telehealth: Payer: Self-pay

## 2024-03-19 DIAGNOSIS — Z7689 Persons encountering health services in other specified circumstances: Secondary | ICD-10-CM | POA: Diagnosis not present

## 2024-03-19 DIAGNOSIS — B20 Human immunodeficiency virus [HIV] disease: Secondary | ICD-10-CM | POA: Diagnosis not present

## 2024-03-19 MED ORDER — AMLODIPINE BESYLATE 10 MG PO TABS: 10.0000 mg | ORAL_TABLET | Freq: Every day | ORAL | 3 refills | Status: AC

## 2024-03-19 NOTE — Telephone Encounter (Signed)
 Patient came into clinic today and dropped off paperwork for [employer] access [place] for Dr. Drue Second to fill out. A copy was given to Dr. Drue Second today and another was placed in triage folder. Once forms are completed they can be faxed to [phone].

## 2024-03-19 NOTE — Telephone Encounter (Signed)
 Medication sent to pharmacy

## 2024-03-24 DIAGNOSIS — B20 Human immunodeficiency virus [HIV] disease: Secondary | ICD-10-CM | POA: Diagnosis not present

## 2024-03-25 DIAGNOSIS — B20 Human immunodeficiency virus [HIV] disease: Secondary | ICD-10-CM | POA: Diagnosis not present

## 2024-03-26 ENCOUNTER — Ambulatory Visit: Payer: Medicaid Other | Admitting: Internal Medicine

## 2024-03-26 DIAGNOSIS — B20 Human immunodeficiency virus [HIV] disease: Secondary | ICD-10-CM | POA: Diagnosis not present

## 2024-03-27 DIAGNOSIS — B20 Human immunodeficiency virus [HIV] disease: Secondary | ICD-10-CM | POA: Diagnosis not present

## 2024-03-28 DIAGNOSIS — B20 Human immunodeficiency virus [HIV] disease: Secondary | ICD-10-CM | POA: Diagnosis not present

## 2024-03-29 DIAGNOSIS — Z419 Encounter for procedure for purposes other than remedying health state, unspecified: Secondary | ICD-10-CM | POA: Diagnosis not present

## 2024-03-31 DIAGNOSIS — B20 Human immunodeficiency virus [HIV] disease: Secondary | ICD-10-CM | POA: Diagnosis not present

## 2024-04-01 DIAGNOSIS — B20 Human immunodeficiency virus [HIV] disease: Secondary | ICD-10-CM | POA: Diagnosis not present

## 2024-04-02 DIAGNOSIS — B20 Human immunodeficiency virus [HIV] disease: Secondary | ICD-10-CM | POA: Diagnosis not present

## 2024-04-03 DIAGNOSIS — B20 Human immunodeficiency virus [HIV] disease: Secondary | ICD-10-CM | POA: Diagnosis not present

## 2024-04-04 DIAGNOSIS — B20 Human immunodeficiency virus [HIV] disease: Secondary | ICD-10-CM | POA: Diagnosis not present

## 2024-04-07 ENCOUNTER — Other Ambulatory Visit: Payer: Self-pay

## 2024-04-07 DIAGNOSIS — B20 Human immunodeficiency virus [HIV] disease: Secondary | ICD-10-CM | POA: Diagnosis not present

## 2024-04-08 DIAGNOSIS — B20 Human immunodeficiency virus [HIV] disease: Secondary | ICD-10-CM | POA: Diagnosis not present

## 2024-04-09 ENCOUNTER — Other Ambulatory Visit: Payer: Self-pay

## 2024-04-09 DIAGNOSIS — B20 Human immunodeficiency virus [HIV] disease: Secondary | ICD-10-CM | POA: Diagnosis not present

## 2024-04-09 NOTE — Progress Notes (Signed)
 Specialty Pharmacy Refill Coordination Note  SHERNITA RABINOVICH is a 63 y.o. female contacted today regarding refills of specialty medication(s) Bictegravir-Emtricitab-Tenofov (Biktarvy )   Patient requested Delivery   Delivery date: 04/14/24   Verified address: [address] [place] [address]   Medication will be filled on 04/11/24.

## 2024-04-10 DIAGNOSIS — B20 Human immunodeficiency virus [HIV] disease: Secondary | ICD-10-CM | POA: Diagnosis not present

## 2024-04-11 ENCOUNTER — Other Ambulatory Visit: Payer: Self-pay

## 2024-04-11 DIAGNOSIS — B20 Human immunodeficiency virus [HIV] disease: Secondary | ICD-10-CM | POA: Diagnosis not present

## 2024-04-14 DIAGNOSIS — B20 Human immunodeficiency virus [HIV] disease: Secondary | ICD-10-CM | POA: Diagnosis not present

## 2024-04-15 DIAGNOSIS — B20 Human immunodeficiency virus [HIV] disease: Secondary | ICD-10-CM | POA: Diagnosis not present

## 2024-04-16 ENCOUNTER — Ambulatory Visit: Payer: Medicaid Other

## 2024-04-16 DIAGNOSIS — B20 Human immunodeficiency virus [HIV] disease: Secondary | ICD-10-CM | POA: Diagnosis not present

## 2024-04-17 DIAGNOSIS — B20 Human immunodeficiency virus [HIV] disease: Secondary | ICD-10-CM | POA: Diagnosis not present

## 2024-04-18 DIAGNOSIS — B20 Human immunodeficiency virus [HIV] disease: Secondary | ICD-10-CM | POA: Diagnosis not present

## 2024-04-21 DIAGNOSIS — B20 Human immunodeficiency virus [HIV] disease: Secondary | ICD-10-CM | POA: Diagnosis not present

## 2024-04-22 DIAGNOSIS — B20 Human immunodeficiency virus [HIV] disease: Secondary | ICD-10-CM | POA: Diagnosis not present

## 2024-04-23 DIAGNOSIS — B20 Human immunodeficiency virus [HIV] disease: Secondary | ICD-10-CM | POA: Diagnosis not present

## 2024-04-24 DIAGNOSIS — B20 Human immunodeficiency virus [HIV] disease: Secondary | ICD-10-CM | POA: Diagnosis not present

## 2024-04-25 DIAGNOSIS — B20 Human immunodeficiency virus [HIV] disease: Secondary | ICD-10-CM | POA: Diagnosis not present

## 2024-04-28 DIAGNOSIS — B20 Human immunodeficiency virus [HIV] disease: Secondary | ICD-10-CM | POA: Diagnosis not present

## 2024-04-28 DIAGNOSIS — Z419 Encounter for procedure for purposes other than remedying health state, unspecified: Secondary | ICD-10-CM | POA: Diagnosis not present

## 2024-04-29 ENCOUNTER — Ambulatory Visit
Admission: RE | Admit: 2024-04-29 | Discharge: 2024-04-29 | Disposition: A | Discharge status: Home / Self Care | Source: Ambulatory Visit | Attending: Internal Medicine | Admitting: Internal Medicine

## 2024-04-29 DIAGNOSIS — Z1231 Encounter for screening mammogram for malignant neoplasm of breast: Secondary | ICD-10-CM | POA: Diagnosis not present

## 2024-04-29 DIAGNOSIS — B20 Human immunodeficiency virus [HIV] disease: Secondary | ICD-10-CM | POA: Diagnosis not present

## 2024-04-30 ENCOUNTER — Telehealth: Payer: Self-pay

## 2024-04-30 DIAGNOSIS — B20 Human immunodeficiency virus [HIV] disease: Secondary | ICD-10-CM | POA: Diagnosis not present

## 2024-04-30 NOTE — Telephone Encounter (Signed)
 Pt called 04/29/2024 regarding her frustration with getting in contact with [facility] and her Case Manager. She stated she is unaware of who her case manager is at this time. I did express that I have spoke with her before and given her the contacts for the [facility] office for [facility], but she stated that she has been unsuccessful with reaching them all last week, and has not received a returned call either. She is now having issues with her public transportation on her bus pass.   At this time [facility] was not present in office or was with a client for me to forward the call to assist with the Pt's concerns or direct to correct case manager.  I did notify the Pt I will reach out and send a message to [facility] to f/u on her concerns to see if we can close the gap. She agreed and was happy with this.   I sent a message to [facility] expressing Pt's concerns and requesting who her case manager is from [employer] - Librarian, academic.  She stated that [facility] location has not received any calls on the main line and she did not leave a message, but she did add her case Production designer, theatre/television/film as well, Sybil Euler.  Buelah Carmel also confirmed that she has introduced herself and has spoke to her regarding a reassessment for 04/24/2024, but has had to reschedule. She stated that she will reach out to her today (04/29/2024).   Documenting for reference in case Pt is to have any further issues or to have any concerns on who their case manager is at this time. She should have her case manager's contact, but she may also contact [place] location at [phone] and please leave a message for them to contact her.   Alisha Whitley - [email or web address]

## 2024-05-01 DIAGNOSIS — B20 Human immunodeficiency virus [HIV] disease: Secondary | ICD-10-CM | POA: Diagnosis not present

## 2024-05-02 DIAGNOSIS — B20 Human immunodeficiency virus [HIV] disease: Secondary | ICD-10-CM | POA: Diagnosis not present

## 2024-05-05 DIAGNOSIS — B20 Human immunodeficiency virus [HIV] disease: Secondary | ICD-10-CM | POA: Diagnosis not present

## 2024-05-06 ENCOUNTER — Telehealth: Payer: Self-pay

## 2024-05-06 ENCOUNTER — Other Ambulatory Visit: Payer: Self-pay

## 2024-05-06 DIAGNOSIS — B20 Human immunodeficiency virus [HIV] disease: Secondary | ICD-10-CM | POA: Diagnosis not present

## 2024-05-06 MED ORDER — ONDANSETRON HCL 4 MG PO TABS: 4.0000 mg | ORAL_TABLET | Freq: Three times a day (TID) | ORAL | 0 refills | Status: DC | PRN

## 2024-05-06 NOTE — Addendum Note (Signed)
 Addended by: Arlon Bergamo D on: 05/06/2024 08:57 AM   Modules accepted: Orders

## 2024-05-06 NOTE — Telephone Encounter (Signed)
 Okay to refill per Dr. Levern Reader.   Jillian Pianka, BSN, RN

## 2024-05-06 NOTE — Telephone Encounter (Signed)
 Patient called requesting refill of ondansetron . States she has severe allergies and her allergy  medication makes her nauseous.   [facility] [address]   Message sent to Dr. Levern Reader.  Gurdeep Keesey, BSN, RN

## 2024-05-07 ENCOUNTER — Other Ambulatory Visit: Payer: Self-pay

## 2024-05-07 DIAGNOSIS — B20 Human immunodeficiency virus [HIV] disease: Secondary | ICD-10-CM | POA: Diagnosis not present

## 2024-05-07 NOTE — Progress Notes (Signed)
 Specialty Pharmacy Refill Coordination Note  Janice Williams is a 63 y.o. female contacted today regarding refills of specialty medication(s) Bictegravir-Emtricitab-Tenofov (Biktarvy )   Patient requested (Patient-Rptd) Delivery   Delivery date: (Patient-Rptd) 05/16/24   Verified address: (Patient-Rptd) [address]   Medication will be filled on 05/15/24.

## 2024-05-08 DIAGNOSIS — B20 Human immunodeficiency virus [HIV] disease: Secondary | ICD-10-CM | POA: Diagnosis not present

## 2024-05-09 DIAGNOSIS — B20 Human immunodeficiency virus [HIV] disease: Secondary | ICD-10-CM | POA: Diagnosis not present

## 2024-05-12 DIAGNOSIS — B20 Human immunodeficiency virus [HIV] disease: Secondary | ICD-10-CM | POA: Diagnosis not present

## 2024-05-13 DIAGNOSIS — B20 Human immunodeficiency virus [HIV] disease: Secondary | ICD-10-CM | POA: Diagnosis not present

## 2024-05-14 DIAGNOSIS — B20 Human immunodeficiency virus [HIV] disease: Secondary | ICD-10-CM | POA: Diagnosis not present

## 2024-05-15 ENCOUNTER — Other Ambulatory Visit: Payer: Self-pay

## 2024-05-16 DIAGNOSIS — B20 Human immunodeficiency virus [HIV] disease: Secondary | ICD-10-CM | POA: Diagnosis not present

## 2024-05-16 NOTE — Progress Notes (Signed)
 The ASCVD Risk score (Arnett DK, et al., 2019) failed to calculate for the following reasons:   Risk score cannot be calculated because patient has a medical history suggesting prior/existing ASCVD  Arlon Bergamo, BSN, RN

## 2024-05-17 ENCOUNTER — Other Ambulatory Visit: Payer: Self-pay | Admitting: Internal Medicine

## 2024-05-19 DIAGNOSIS — B20 Human immunodeficiency virus [HIV] disease: Secondary | ICD-10-CM | POA: Diagnosis not present

## 2024-05-20 DIAGNOSIS — B20 Human immunodeficiency virus [HIV] disease: Secondary | ICD-10-CM | POA: Diagnosis not present

## 2024-05-21 DIAGNOSIS — B20 Human immunodeficiency virus [HIV] disease: Secondary | ICD-10-CM | POA: Diagnosis not present

## 2024-05-22 DIAGNOSIS — B20 Human immunodeficiency virus [HIV] disease: Secondary | ICD-10-CM | POA: Diagnosis not present

## 2024-05-23 DIAGNOSIS — B20 Human immunodeficiency virus [HIV] disease: Secondary | ICD-10-CM | POA: Diagnosis not present

## 2024-05-26 ENCOUNTER — Ambulatory Visit: Admitting: Internal Medicine

## 2024-05-26 DIAGNOSIS — B20 Human immunodeficiency virus [HIV] disease: Secondary | ICD-10-CM | POA: Diagnosis not present

## 2024-05-27 DIAGNOSIS — B20 Human immunodeficiency virus [HIV] disease: Secondary | ICD-10-CM | POA: Diagnosis not present

## 2024-05-28 DIAGNOSIS — B20 Human immunodeficiency virus [HIV] disease: Secondary | ICD-10-CM | POA: Diagnosis not present

## 2024-05-29 DIAGNOSIS — Z419 Encounter for procedure for purposes other than remedying health state, unspecified: Secondary | ICD-10-CM | POA: Diagnosis not present

## 2024-05-29 DIAGNOSIS — B20 Human immunodeficiency virus [HIV] disease: Secondary | ICD-10-CM | POA: Diagnosis not present

## 2024-05-30 DIAGNOSIS — B20 Human immunodeficiency virus [HIV] disease: Secondary | ICD-10-CM | POA: Diagnosis not present

## 2024-06-01 ENCOUNTER — Ambulatory Visit: Admission: EM | Admit: 2024-06-01 | Discharge: 2024-06-01 | Disposition: A | Discharge status: Home / Self Care

## 2024-06-01 ENCOUNTER — Other Ambulatory Visit: Payer: Self-pay

## 2024-06-01 ENCOUNTER — Encounter (HOSPITAL_COMMUNITY): Payer: Self-pay

## 2024-06-01 ENCOUNTER — Encounter: Payer: Self-pay | Admitting: Emergency Medicine

## 2024-06-01 ENCOUNTER — Emergency Department (HOSPITAL_COMMUNITY)

## 2024-06-01 ENCOUNTER — Emergency Department (HOSPITAL_COMMUNITY)
Admission: EM | Admit: 2024-06-01 | Discharge: 2024-06-01 | Disposition: A | Discharge status: Home / Self Care | Attending: Emergency Medicine | Admitting: Emergency Medicine

## 2024-06-01 DIAGNOSIS — Z21 Asymptomatic human immunodeficiency virus [HIV] infection status: Secondary | ICD-10-CM | POA: Insufficient documentation

## 2024-06-01 DIAGNOSIS — K5900 Constipation, unspecified: Secondary | ICD-10-CM | POA: Diagnosis not present

## 2024-06-01 DIAGNOSIS — R112 Nausea with vomiting, unspecified: Secondary | ICD-10-CM

## 2024-06-01 DIAGNOSIS — J449 Chronic obstructive pulmonary disease, unspecified: Secondary | ICD-10-CM | POA: Diagnosis not present

## 2024-06-01 DIAGNOSIS — R1030 Lower abdominal pain, unspecified: Secondary | ICD-10-CM | POA: Diagnosis not present

## 2024-06-01 DIAGNOSIS — R1084 Generalized abdominal pain: Secondary | ICD-10-CM | POA: Diagnosis not present

## 2024-06-01 DIAGNOSIS — N200 Calculus of kidney: Secondary | ICD-10-CM | POA: Diagnosis not present

## 2024-06-01 DIAGNOSIS — K5730 Diverticulosis of large intestine without perforation or abscess without bleeding: Secondary | ICD-10-CM | POA: Diagnosis not present

## 2024-06-01 DIAGNOSIS — Z8673 Personal history of transient ischemic attack (TIA), and cerebral infarction without residual deficits: Secondary | ICD-10-CM | POA: Insufficient documentation

## 2024-06-01 LAB — URINALYSIS, ROUTINE W REFLEX MICROSCOPIC
Bacteria, UA: NONE SEEN
Bilirubin Urine: NEGATIVE
Glucose, UA: NEGATIVE mg/dL
Hgb urine dipstick: NEGATIVE
Ketones, ur: NEGATIVE mg/dL
Leukocytes,Ua: NEGATIVE
Nitrite: NEGATIVE
Protein, ur: 30 mg/dL — AB
Specific Gravity, Urine: 1.01 (ref 1.005–1.030)
pH: 8 (ref 5.0–8.0)

## 2024-06-01 LAB — COMPREHENSIVE METABOLIC PANEL WITH GFR
ALT: 10 U/L (ref 0–44)
AST: 25 U/L (ref 15–41)
Albumin: 4.3 g/dL (ref 3.5–5.0)
Alkaline Phosphatase: 49 U/L (ref 38–126)
Anion gap: 11 (ref 5–15)
BUN: 8 mg/dL (ref 8–23)
CO2: 24 mmol/L (ref 22–32)
Calcium: 9.4 mg/dL (ref 8.9–10.3)
Chloride: 101 mmol/L (ref 98–111)
Creatinine, Ser: 0.83 mg/dL (ref 0.44–1.00)
GFR, Estimated: >60 mL/min (ref >60)
Glucose, Bld: 100 mg/dL — ABNORMAL HIGH (ref 70–99)
Potassium: 4.5 mmol/L (ref 3.5–5.1)
Sodium: 136 mmol/L (ref 135–145)
Total Bilirubin: 0.7 mg/dL (ref 0.0–1.2)
Total Protein: 8 g/dL (ref 6.5–8.1)

## 2024-06-01 LAB — CBC
HCT: 35.3 % — ABNORMAL LOW (ref 36.0–46.0)
Hemoglobin: 11.6 g/dL — ABNORMAL LOW (ref 12.0–15.0)
MCH: 32.8 pg (ref 26.0–34.0)
MCHC: 32.9 g/dL (ref 30.0–36.0)
MCV: 99.7 fL (ref 80.0–100.0)
Platelets: 286 10*3/uL (ref 150–400)
RBC: 3.54 MIL/uL — ABNORMAL LOW (ref 3.87–5.11)
RDW: 14.2 % (ref 11.5–15.5)
WBC: 5 10*3/uL (ref 4.0–10.5)
nRBC: 0 % (ref 0.0–0.2)

## 2024-06-01 LAB — LIPASE, BLOOD: Lipase: 32 U/L (ref 11–51)

## 2024-06-01 MED ORDER — ONDANSETRON 8 MG PO TBDP: 8.0000 mg | ORAL_TABLET | Freq: Three times a day (TID) | ORAL | 0 refills | Status: DC | PRN

## 2024-06-01 MED ORDER — ONDANSETRON 4 MG PO TBDP
4.0000 mg | ORAL_TABLET | Freq: Once | ORAL | Status: AC
  Administered 2024-06-01: 4 mg via ORAL

## 2024-06-01 MED ORDER — ONDANSETRON HCL 4 MG/2ML IJ SOLN
4.0000 mg | Freq: Once | INTRAMUSCULAR | Status: AC
  Administered 2024-06-01: 4 mg via INTRAVENOUS
  Filled 2024-06-01: qty 2

## 2024-06-01 MED ORDER — IOHEXOL 300 MG/ML  SOLN
80.0000 mL | Freq: Once | INTRAMUSCULAR | Status: AC | PRN
  Administered 2024-06-01: 80 mL via INTRAVENOUS

## 2024-06-01 MED ORDER — MAGNESIUM CITRATE PO SOLN: 1.0000 | Freq: Once | ORAL | 0 refills | Status: AC

## 2024-06-01 NOTE — ED Provider Notes (Signed)
 [place] EMERGENCY DEPARTMENT AT [facility] Provider Note   CSN: [id number] Arrival date & time: 06/01/24  1551     Patient presents with: Abdominal Pain   Janice Williams is a 63 y.o. female.   HPI    63 year old patient comes in with chief complaint of abdominal pain. Patient has past medical history of HIV, COPD, stroke.  Patient comes to the emergency room with nausea, vomiting and abdominal discomfort.  Patient states that she has not had a bowel movement in at least 4 days.  She is now having abdominal discomfort, nausea and vomiting.  She has vomited at least 4 times a day.  No blood in the stool.  Patient denies any burning with urination, blood in the urine, blood in the vomit, previous history of blood in the stool.  She has no history of kidney stones and denies any abdominal surgeries.   Prior to Admission medications   Medication Sig Start Date End Date Taking? Authorizing Provider  ondansetron  (ZOFRAN -ODT) 8 MG disintegrating tablet Take 1 tablet (8 mg total) by mouth every 8 (eight) hours as needed for nausea. 06/01/24  Yes Deatra Face, MD  albuterol  (VENTOLIN  HFA) 108 (90 Base) MCG/ACT inhaler Inhale 2 puffs into the lungs every 6 (six) hours as needed for wheezing or shortness of breath. 02/28/23   Aram Knights, MD  amLODipine  (NORVASC ) 10 MG tablet Take 1 tablet (10 mg total) by mouth daily. 03/19/24 03/19/25  Ronni Colace, DO  amoxicillin -clavulanate (AUGMENTIN ) 875-125 MG tablet Take 1 tablet by mouth every 12 (twelve) hours. 02/27/24   Liane Redman, MD  bictegravir-emtricitabine -tenofovir  AF (BIKTARVY ) 50-200-25 MG TABS tablet Take 1 tablet by mouth daily. 02/27/24   Liane Redman, MD  cefpodoxime  (VANTIN ) 200 MG tablet Take 1 tablet by mouth. 11/28/23   [provider]  Ensure (ENSURE) Take 237 mLs by mouth 3 (three) times daily between meals. 12/20/23   Liane Redman, MD  EPIPEN  2-PAK 0.3 MG/0.3ML SOAJ injection  03/21/22   [provider]  fluticasone  (FLONASE ) 50 MCG/ACT nasal spray Use 1 spray(s) in each nostril once daily 06/18/23   McLendon, Michael, MD  guaiFENesin -dextromethorphan  (ROBITUSSIN DM) 100-10 MG/5ML syrup Take 5 mLs by mouth every 4 (four) hours as needed for cough. 12/26/23   Liane Redman, MD  levocetirizine (XYZAL ) 5 MG tablet Take 1 tablet (5 mg total) by mouth every evening. 12/26/23   Liane Redman, MD  MIEBO 1.338 GM/ML SOLN  02/25/24   [provider]  mupirocin ointment (BACTROBAN) 2 % Apply a small amount inside of both nostrils twice daily for 14 days. 02/28/24   [provider]  Olopatadine  HCl 0.2 % SOLN 1 drop into affected eye Ophthalmic Once a day    [provider]  omeprazole  (PRILOSEC) 20 MG capsule Take 1 capsule (20 mg total) by mouth daily. 02/13/24   Cleven Dallas, DO  ondansetron  (ZOFRAN ) 4 MG tablet Take 1 tablet (4 mg total) by mouth every 8 (eight) hours as needed for nausea or vomiting. 05/06/24   Liane Redman, MD  prednisoLONE acetate (PRED FORTE) 1 % ophthalmic suspension 1 drop 4 (four) times daily. 02/13/24   [provider]  PRESCRIPTION MEDICATION Inject 1 Dose into the skin once a week. Allergy  shot in office on Fridays    [provider]  RESTASIS 0.05 % ophthalmic emulsion 1 drop 2 (two) times daily. 08/05/21   [provider]  rosuvastatin  (CRESTOR ) 40 MG tablet Take 1 tablet (40 mg  total) by mouth daily. 12/27/23 12/26/24  Ronni Colace, DO  Spacer/Aero-Holding Chambers (AEROCHAMBER MV) inhaler as directed for 30 days    [provider]  SYMBICORT  160-4.5 MCG/ACT inhaler Inhale 2 puffs into the lungs 2 (two) times daily. 01/13/24   [provider]  Tiotropium Bromide  Monohydrate (SPIRIVA  RESPIMAT) 2.5 MCG/ACT AERS Inhale 2 puffs into the lungs daily. 02/12/24   Cleven Dallas, DO  traMADol  (ULTRAM ) 50 MG tablet TAKE 1 TABLET BY MOUTH EVERY 6 HOURS AS NEEDED. 05/19/24   Liane Redman, MD  TYRVAYA  0.03  MG/ACT SOLN  02/25/24   [provider]    Allergies: Varenicline     Review of Systems  All other systems reviewed and are negative.   Updated Vital Signs BP 117/83   Pulse 81   Temp 98.7 F (37.1 C) (Oral)   Resp 18   Ht 5' 3 (1.6 m)   Wt 43.1 kg   SpO2 97%   BMI 16.83 kg/m   Physical Exam Vitals and nursing note reviewed.  Constitutional:      Appearance: She is well-developed.  HENT:     Head: Atraumatic.   Cardiovascular:     Rate and Rhythm: Normal rate.  Pulmonary:     Effort: Pulmonary effort is normal.  Abdominal:     Tenderness: There is generalized abdominal tenderness. There is no guarding or rebound.   Musculoskeletal:     Cervical back: Normal range of motion and neck supple.   Skin:    General: Skin is warm and dry.   Neurological:     Mental Status: She is alert and oriented to person, place, and time.     (all labs ordered are listed, but only abnormal results are displayed) Labs Reviewed  COMPREHENSIVE METABOLIC PANEL WITH GFR - Abnormal; Notable for the following components:      Result Value   Glucose, Bld 100 (*)    All other components within normal limits  CBC - Abnormal; Notable for the following components:   RBC 3.54 (*)    Hemoglobin 11.6 (*)    HCT 35.3 (*)    All other components within normal limits  URINALYSIS, ROUTINE W REFLEX MICROSCOPIC - Abnormal; Notable for the following components:   Color, Urine STRAW (*)    Protein, ur 30 (*)    All other components within normal limits  LIPASE, BLOOD    EKG: None  Radiology: CT ABDOMEN PELVIS W CONTRAST Result Date: 06/01/2024 CLINICAL DATA:  Abdominal pain, acute, nonlocalized Per triage notes: Patient was sent from urgent care because she has been vomiting since last night and is unable to have bowel movement. Last bowel movement was 4 days ago. Has lower centralized abdominal pain. EXAM: CT ABDOMEN AND PELVIS WITH CONTRAST TECHNIQUE: Multidetector CT imaging of  the abdomen and pelvis was performed using the standard protocol following bolus administration of intravenous contrast. RADIATION DOSE REDUCTION: This exam was performed according to the departmental dose-optimization program which includes automated exposure control, adjustment of the mA and/or kV according to patient size and/or use of iterative reconstruction technique. CONTRAST:  80mL OMNIPAQUE IOHEXOL 300 MG/ML  SOLN COMPARISON:  None Available. FINDINGS: Lower chest: No acute abnormality. Hepatobiliary: No focal liver abnormality. No gallstones, gallbladder wall thickening, or pericholecystic fluid. No biliary dilatation. Pancreas: No focal lesion. Normal pancreatic contour. No surrounding inflammatory changes. No main pancreatic ductal dilatation. Spleen: Normal in size without focal abnormality. Adrenals/Urinary Tract: No adrenal nodule bilaterally. Bilateral kidneys enhance symmetrically. No hydronephrosis.  No hydroureter. 3 mm calcified stone within the left kidney. No right nephrolithiasis. No ureterolithiasis bilaterally. The urinary bladder is unremarkable. Stomach/Bowel: Stomach is within normal limits. No evidence of bowel wall thickening or dilatation. Colonic diverticulosis. Appendix appears normal. Vascular/Lymphatic: The main portal, splenic, superior mesenteric vein are patent. No abdominal aorta or iliac aneurysm. Mild atherosclerotic plaque of the aorta and its branches. No abdominal, pelvic, or inguinal lymphadenopathy. Reproductive: Uterus and bilateral adnexa are unremarkable. Other: No intraperitoneal free fluid. No intraperitoneal free gas. No organized fluid collection. Musculoskeletal: No abdominal wall hernia or abnormality. No suspicious lytic or blastic osseous lesions. No acute displaced fracture. Multilevel degenerative changes of the spine. IMPRESSION: 1. Colonic diverticulosis with no acute diverticulitis. 2. Nonobstructive 3 mm right nephrolithiasis. 3.  Aortic Atherosclerosis  (ICD10-I70.0). Electronically Signed   By: Morgane  Naveau M.D.   On: 06/01/2024 19:35     Procedures   Medications Ordered in the ED  ondansetron  (ZOFRAN ) injection 4 mg (has no administration in time range)  iohexol (OMNIPAQUE) 300 MG/ML solution 80 mL (80 mLs Intravenous Contrast Given 06/01/24 1847)                                    Medical Decision Making Amount and/or Complexity of Data Reviewed Labs: ordered. Radiology: ordered.  Risk Prescription drug management.   This patient presents to the ED with chief complaint(s) of abd pain, nausea, emesis, constipation with pertinent past medical history of HIV, CVA, COPD. The complaint involves an extensive differential diagnosis and also carries with it a high risk of complications and morbidity.    The differential diagnosis includes : Ileus, sbo, intra-abd abscess, pud.   The initial plan is to get basic labs, CT abdomen pelvis with contrast.   Additional history obtained: Records reviewed Primary Care Documents and urgent care notes.  Independent labs interpretation:  The following labs were independently interpreted: CBC, metabolic profile is normal.  Independent visualization and interpretation of imaging: - I independently visualized the following imaging with scope of interpretation limited to determining acute life threatening conditions related to emergency care: CT abd, which revealed no obstruction.  Treatment and Reassessment: The patient appears reasonably screened and/or stabilized for discharge and I doubt any other medical condition or other [facility] requiring further screening, evaluation, or treatment in the ED at this time prior to discharge.   Results from the ER workup discussed with the patient face to face and all questions answered to the best of my ability. The patient is safe for discharge with strict return precautions.   Final diagnoses:  Generalized abdominal pain    ED Discharge Orders           Ordered    ondansetron  (ZOFRAN -ODT) 8 MG disintegrating tablet  Every 8 hours PRN        06/01/24 2020               Deatra Face, MD 06/01/24 2020

## 2024-06-01 NOTE — ED Provider Notes (Signed)
 [facility]    CSN: [id number] Arrival date & time: 06/01/24  1433      History   Chief Complaint Chief Complaint  Patient presents with   Emesis    HPI Janice Williams is a 63 y.o. female.   Presents to urgent care today for evaluation of vomiting that started last night.  She reports that she has had constipation has not had a bowel movement in 3 days.  She notes she has been passing gas.  She states smelling food is causing her to become nauseated.  She has not been able to hold down any food.  She has not had fever.  She does report abdominal pain.  The history is provided by the patient.  Emesis Associated symptoms: abdominal pain   Associated symptoms: no chills and no fever     Past Medical History:  Diagnosis Date   Anemia    Arthritis    generalized   Blood transfusion without reported diagnosis 2003   Bronchitis    Cataract    COPD (chronic obstructive pulmonary disease) (HCC)    COPD with acute exacerbation (HCC) 06/09/2017   Dental abscess 03/15/2015   DYSPEPSIA 02/14/2007   Qualifier: Diagnosis of  By: Winona Haw     EXTERNAL OTITIS 01/06/2010   Qualifier: Diagnosis of  By: Ferrell Hu MD, Loetta Ringer     FACIAL RASH 02/04/2009   Qualifier: Diagnosis of  By: Pearletha Bouche     Former smoker    quit 2014   GERD (gastroesophageal reflux disease)    Glaucoma    HIV infection (HCC)    Hypertension    Osteoporosis    Seasonal allergies    Stroke (HCC) 2003   Substance abuse (HCC)    history, clean 7 years   SVD (spontaneous vaginal delivery)    x 3    Patient Active Problem List   Diagnosis Date Noted   Vitamin D  deficiency 08/08/2023   Rash of hand 12/21/2022   Former cigarette smoker 11/16/2022   Sprain of calcaneofibular ligament 02/28/2022   Degenerative disc disease, cervical 10/13/2020   Osteoporosis 10/10/2019   COPD  GOLD 3  07/08/2019   Moderate protein-calorie malnutrition (HCC) 05/31/2018   Hypertension 06/08/2017    Hyperlipidemia 06/03/2009   Human immunodeficiency virus (HIV) disease (HCC) 10/29/2008   GERD 10/29/2008   CEREBROVASCULAR ACCIDENT, HX OF 10/29/2008   Symptoms concerning nutrition, metabolism, and development 02/14/2007   Rhinitis, chronic 02/14/2007    Past Surgical History:  Procedure Laterality Date   COLONOSCOPY  2019   KN-MAC-suprep(adeq)-tics/hems/TA x 5   MULTIPLE TOOTH EXTRACTIONS     with sedation   POLYPECTOMY  01/2008   TA x 5   TONSILLECTOMY  1973   TUBAL LIGATION     UPPER GI ENDOSCOPY     normal per patient - years ago    OB History     Gravida  3   Para  3   Term  3   Preterm      AB      Living  3      SAB      IAB      Ectopic      Multiple      Live Births  3            Home Medications    Prior to Admission medications   Medication Sig Start Date End Date Taking? Authorizing Provider  cefpodoxime  (VANTIN ) 200 MG tablet Take  1 tablet by mouth. 11/28/23  Yes [provider]  mupirocin ointment (BACTROBAN) 2 % Apply a small amount inside of both nostrils twice daily for 14 days. 02/28/24  Yes [provider]  prednisoLONE acetate (PRED FORTE) 1 % ophthalmic suspension 1 drop 4 (four) times daily. 02/13/24  Yes [provider]  albuterol  (VENTOLIN  HFA) 108 (90 Base) MCG/ACT inhaler Inhale 2 puffs into the lungs every 6 (six) hours as needed for wheezing or shortness of breath. 02/28/23   Aram Knights, MD  amLODipine  (NORVASC ) 10 MG tablet Take 1 tablet (10 mg total) by mouth daily. 03/19/24 03/19/25  Ronni Colace, DO  amoxicillin -clavulanate (AUGMENTIN ) 875-125 MG tablet Take 1 tablet by mouth every 12 (twelve) hours. 02/27/24   Liane Redman, MD  bictegravir-emtricitabine -tenofovir  AF (BIKTARVY ) 50-200-25 MG TABS tablet Take 1 tablet by mouth daily. 02/27/24   Liane Redman, MD  Ensure (ENSURE) Take 237 mLs by mouth 3 (three) times daily between meals. 12/20/23   Liane Redman, MD  EPIPEN  2-PAK 0.3  MG/0.3ML SOAJ injection  03/21/22   [provider]  fluticasone  (FLONASE ) 50 MCG/ACT nasal spray Use 1 spray(s) in each nostril once daily 06/18/23   McLendon, Michael, MD  guaiFENesin -dextromethorphan  (ROBITUSSIN DM) 100-10 MG/5ML syrup Take 5 mLs by mouth every 4 (four) hours as needed for cough. 12/26/23   Liane Redman, MD  levocetirizine (XYZAL ) 5 MG tablet Take 1 tablet (5 mg total) by mouth every evening. 12/26/23   Liane Redman, MD  MIEBO 1.338 GM/ML SOLN  02/25/24   [provider]  Olopatadine  HCl 0.2 % SOLN 1 drop into affected eye Ophthalmic Once a day    [provider]  omeprazole  (PRILOSEC) 20 MG capsule Take 1 capsule (20 mg total) by mouth daily. 02/13/24   Cleven Dallas, DO  ondansetron  (ZOFRAN ) 4 MG tablet Take 1 tablet (4 mg total) by mouth every 8 (eight) hours as needed for nausea or vomiting. 05/06/24   Liane Redman, MD  PRESCRIPTION MEDICATION Inject 1 Dose into the skin once a week. Allergy  shot in office on Fridays    [provider]  RESTASIS 0.05 % ophthalmic emulsion 1 drop 2 (two) times daily. 08/05/21   [provider]  rosuvastatin  (CRESTOR ) 40 MG tablet Take 1 tablet (40 mg total) by mouth daily. 12/27/23 12/26/24  Ronni Colace, DO  Spacer/Aero-Holding Chambers (AEROCHAMBER MV) inhaler as directed for 30 days    [provider]  SYMBICORT  160-4.5 MCG/ACT inhaler Inhale 2 puffs into the lungs 2 (two) times daily. 01/13/24   [provider]  Tiotropium Bromide  Monohydrate (SPIRIVA  RESPIMAT) 2.5 MCG/ACT AERS Inhale 2 puffs into the lungs daily. 02/12/24   Cleven Dallas, DO  traMADol  (ULTRAM ) 50 MG tablet TAKE 1 TABLET BY MOUTH EVERY 6 HOURS AS NEEDED. 05/19/24   Liane Redman, MD  TYRVAYA  0.03 MG/ACT SOLN  02/25/24   [provider]    Family History Family History  Problem Relation Age of Onset   Hypertension Mother    Hypertension Father    Hyperlipidemia Father    COPD Father    Atrial  fibrillation Father    Colon cancer Neg Hx    Rectal cancer Neg Hx    Stomach cancer Neg Hx    Colon polyps Neg Hx    Esophageal cancer Neg Hx    BRCA 1/2 Neg Hx    Breast cancer Neg Hx     Social History Social History   Tobacco Use   Smoking status:  Former    Current packs/day: 0.00    Average packs/day: 2.0 packs/day for 38.0 years (76.1 ttl pk-yrs)    Types: Cigarettes    Start date: 12/18/2012    Quit date: 11/21/2013    Years since quitting: 10.5    Passive exposure: Past   Smokeless tobacco: Never  Vaping Use   Vaping status: Never Used  Substance Use Topics   Alcohol use: Not Currently   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     Allergies   Varenicline    Review of Systems Review of Systems  Constitutional:  Negative for chills and fever.  Eyes:  Negative for discharge and redness.  Respiratory:  Negative for shortness of breath.   Gastrointestinal:  Positive for abdominal pain, constipation, nausea and vomiting.     Physical Exam Triage Vital Signs ED Triage Vitals  Encounter Vitals Group     BP 06/01/24 1501 (!) 146/91     Girls Systolic BP Percentile --      Girls Diastolic BP Percentile --      Boys Systolic BP Percentile --      Boys Diastolic BP Percentile --      Pulse Rate 06/01/24 1501 (!) 101     Resp 06/01/24 1501 18     Temp 06/01/24 1501 98.1 F (36.7 C)     Temp Source 06/01/24 1501 Oral     SpO2 06/01/24 1501 97 %     Weight 06/01/24 1501 93 lb 4.1 oz (42.3 kg)     Height --      Head Circumference --      Peak Flow --      Pain Score 06/01/24 1500 0     Pain Loc --      Pain Education --      Exclude from Growth Chart --    No data found.  Updated Vital Signs BP (!) 146/91 (BP Location: Right Arm)   Pulse (!) 101   Temp 98.1 F (36.7 C) (Oral)   Resp 18   Wt 93 lb 4.1 oz (42.3 kg)   SpO2 97%   BMI 16.52 kg/m   Visual Acuity Right Eye Distance:   Left Eye Distance:   Bilateral Distance:    Right Eye  Near:   Left Eye Near:    Bilateral Near:     Physical Exam Vitals and nursing note reviewed.  Constitutional:      General: She is not in acute distress.    Appearance: Normal appearance. She is not ill-appearing.  HENT:     Head: Normocephalic and atraumatic.   Eyes:     Conjunctiva/sclera: Conjunctivae normal.    Cardiovascular:     Rate and Rhythm: Normal rate.  Pulmonary:     Effort: Pulmonary effort is normal. No respiratory distress.  Abdominal:     General: Abdomen is flat. There is no distension.     Palpations: Abdomen is soft.     Tenderness: There is abdominal tenderness (significant TTP to upper abdomen diffusely). There is no guarding or rebound.   Neurological:     Mental Status: She is alert.   Psychiatric:        Mood and Affect: Mood normal.        Behavior: Behavior normal.        Thought Content: Thought content normal.      UC Treatments / Results  Labs (all labs ordered are listed, but only abnormal  results are displayed) Labs Reviewed - No data to display  EKG   Radiology No results found.  Procedures Procedures (including critical care time)  Medications Ordered in UC Medications  ondansetron  (ZOFRAN -ODT) disintegrating tablet 4 mg (4 mg Oral Given 06/01/24 1506)    Initial Impression / Assessment and Plan / UC Course  I have reviewed the triage vital signs and the nursing notes.  Pertinent labs & imaging results that were available during my care of the patient were reviewed by me and considered in my medical decision making (see chart for details).    Given significant tenderness to palpation and constipation concerns for possible obstruction.  Advised further evaluation in the emergency room for stat imaging.  Patient is agreeable and has someone to drive her via POV to local ER.  Final Clinical Impressions(s) / UC Diagnoses   Final diagnoses:  Nausea and vomiting, unspecified vomiting type  Constipation, unspecified  constipation type   Discharge Instructions   None    ED Prescriptions   None    PDMP not reviewed this encounter.   Vernestine Gondola, PA-C 06/01/24 1513

## 2024-06-01 NOTE — ED Notes (Signed)
 Patient is being discharged from the Urgent Care and sent to the Emergency Department via POV . Per Jami Mcclintock, PA, patient is in need of higher level of care due to vomiting, constipation, and abdominal pain. Patient is aware and verbalizes understanding of plan of care.  Vitals:   06/01/24 1501  BP: (!) 146/91  Pulse: (!) 101  Resp: 18  Temp: 98.1 F (36.7 C)  SpO2: 97%

## 2024-06-01 NOTE — Discharge Instructions (Signed)
We saw you in the ER for abdominal discomfort. The results of our workup, including labs and imaging are reassuring at this time.   Symptoms can evolve, therefore please return to the ER if you have increased pain, fevers, chills, inability to keep any medications down, confusion, sweating. Otherwise see your primary care doctor in 2-3 days for further evaluation.

## 2024-06-01 NOTE — ED Triage Notes (Signed)
 Patient was sent from urgent care because she has been vomiting since last night and is unable to have bowel movement. Last bowel movement was 4 days ago. Has lower centralized abdominal pain.

## 2024-06-01 NOTE — ED Triage Notes (Signed)
 Pt presents c/o emesis since last night. Pt also c/o constipation. She also states she is having a tough time smelling food. Pt denies any additional sxs.

## 2024-06-02 DIAGNOSIS — B20 Human immunodeficiency virus [HIV] disease: Secondary | ICD-10-CM | POA: Diagnosis not present

## 2024-06-03 DIAGNOSIS — B20 Human immunodeficiency virus [HIV] disease: Secondary | ICD-10-CM | POA: Diagnosis not present

## 2024-06-04 ENCOUNTER — Encounter: Payer: Self-pay | Admitting: Internal Medicine

## 2024-06-04 ENCOUNTER — Ambulatory Visit (INDEPENDENT_AMBULATORY_CARE_PROVIDER_SITE_OTHER): Admitting: Internal Medicine

## 2024-06-04 ENCOUNTER — Other Ambulatory Visit: Payer: Self-pay

## 2024-06-04 VITALS — BP 126/82 | HR 68 | Resp 16 | Ht 63.0 in | Wt 91.0 lb

## 2024-06-04 DIAGNOSIS — R634 Abnormal weight loss: Secondary | ICD-10-CM

## 2024-06-04 DIAGNOSIS — E43 Unspecified severe protein-calorie malnutrition: Secondary | ICD-10-CM | POA: Diagnosis not present

## 2024-06-04 DIAGNOSIS — B20 Human immunodeficiency virus [HIV] disease: Secondary | ICD-10-CM | POA: Diagnosis not present

## 2024-06-04 DIAGNOSIS — K5900 Constipation, unspecified: Secondary | ICD-10-CM | POA: Diagnosis not present

## 2024-06-04 DIAGNOSIS — Z79899 Other long term (current) drug therapy: Secondary | ICD-10-CM

## 2024-06-04 DIAGNOSIS — J301 Allergic rhinitis due to pollen: Secondary | ICD-10-CM

## 2024-06-04 MED ORDER — HYDROCORTISONE ACETATE 25 MG RE SUPP: 25.0000 mg | Freq: Two times a day (BID) | RECTAL | 0 refills | Status: DC

## 2024-06-04 MED ORDER — MIRTAZAPINE 15 MG PO TABS: 15.0000 mg | ORAL_TABLET | Freq: Every day | ORAL | 11 refills | Status: DC

## 2024-06-04 MED ORDER — FLUTICASONE PROPIONATE 50 MCG/ACT NA SUSP
NASAL | 0 refills | Status: DC
Start: 2024-06-04 — End: 2024-08-19

## 2024-06-04 NOTE — Patient Instructions (Addendum)
   Continue with fiber and metamucil for help keeping your bowel movements regular  Smooth move tea

## 2024-06-04 NOTE — Progress Notes (Signed)
 RFV: follow up for hiv disease  Patient ID: Janice Williams, female   DOB: 1961/09/23, 63 y.o.   MRN: [id number]  HPI  Janice Williams is a 63yo F with HIV disease, chronic pain,  Lots of stressor-- not sleeping well, tearful today Lack of appetite Lost 2 pounds since visit in march  Stopped mirtazipine about 2-3 months ago. -- restart to help with sleep   Outpatient Encounter Medications as of 06/04/2024  Medication Sig   albuterol  (VENTOLIN  HFA) 108 (90 Base) MCG/ACT inhaler Inhale 2 puffs into the lungs every 6 (six) hours as needed for wheezing or shortness of breath.   amLODipine  (NORVASC ) 10 MG tablet Take 1 tablet (10 mg total) by mouth daily.   bictegravir-emtricitabine -tenofovir  AF (BIKTARVY ) 50-200-25 MG TABS tablet Take 1 tablet by mouth daily.   Ensure (ENSURE) Take 237 mLs by mouth 3 (three) times daily between meals.   EPIPEN  2-PAK 0.3 MG/0.3ML SOAJ injection    levocetirizine (XYZAL ) 5 MG tablet Take 1 tablet (5 mg total) by mouth every evening.   MIEBO 1.338 GM/ML SOLN    mupirocin ointment (BACTROBAN) 2 % Apply a small amount inside of both nostrils twice daily for 14 days.   Olopatadine  HCl 0.2 % SOLN 1 drop into affected eye Ophthalmic Once a day   omeprazole  (PRILOSEC) 20 MG capsule Take 1 capsule (20 mg total) by mouth daily.   ondansetron  (ZOFRAN ) 4 MG tablet Take 1 tablet (4 mg total) by mouth every 8 (eight) hours as needed for nausea or vomiting.   ondansetron  (ZOFRAN -ODT) 8 MG disintegrating tablet Take 1 tablet (8 mg total) by mouth every 8 (eight) hours as needed for nausea.   PRESCRIPTION MEDICATION Inject 1 Dose into the skin once a week. Allergy  shot in office on Fridays   RESTASIS 0.05 % ophthalmic emulsion 1 drop 2 (two) times daily.   rosuvastatin  (CRESTOR ) 40 MG tablet Take 1 tablet (40 mg total) by mouth daily.   Spacer/Aero-Holding Chambers (AEROCHAMBER MV) inhaler as directed for 30 days   SYMBICORT  160-4.5 MCG/ACT inhaler Inhale 2 puffs into the lungs 2  (two) times daily.   Tiotropium Bromide  Monohydrate (SPIRIVA  RESPIMAT) 2.5 MCG/ACT AERS Inhale 2 puffs into the lungs daily.   traMADol  (ULTRAM ) 50 MG tablet TAKE 1 TABLET BY MOUTH EVERY 6 HOURS AS NEEDED.   TYRVAYA  0.03 MG/ACT SOLN    amoxicillin -clavulanate (AUGMENTIN ) 875-125 MG tablet Take 1 tablet by mouth every 12 (twelve) hours. (Patient not taking: Reported on 06/04/2024)   cefpodoxime  (VANTIN ) 200 MG tablet Take 1 tablet by mouth. (Patient not taking: Reported on 06/04/2024)   fluticasone  (FLONASE ) 50 MCG/ACT nasal spray Use 1 spray(s) in each nostril once daily (Patient not taking: Reported on 06/04/2024)   guaiFENesin -dextromethorphan  (ROBITUSSIN DM) 100-10 MG/5ML syrup Take 5 mLs by mouth every 4 (four) hours as needed for cough. (Patient not taking: Reported on 06/04/2024)   prednisoLONE acetate (PRED FORTE) 1 % ophthalmic suspension 1 drop 4 (four) times daily. (Patient not taking: Reported on 06/04/2024)   No facility-administered encounter medications on file as of 06/04/2024.     Patient Active Problem List   Diagnosis Date Noted   Vitamin D  deficiency 08/08/2023   Rash of hand 12/21/2022   Former cigarette smoker 11/16/2022   Sprain of calcaneofibular ligament 02/28/2022   Degenerative disc disease, cervical 10/13/2020   Osteoporosis 10/10/2019   COPD  GOLD 3  07/08/2019   Moderate protein-calorie malnutrition (HCC) 05/31/2018   Hypertension 06/08/2017   Hyperlipidemia 06/03/2009  Human immunodeficiency virus (HIV) disease (HCC) 10/29/2008   GERD 10/29/2008   CEREBROVASCULAR ACCIDENT, HX OF 10/29/2008   Symptoms concerning nutrition, metabolism, and development 02/14/2007   Rhinitis, chronic 02/14/2007     Health Maintenance Due  Topic Date Due   Zoster Vaccines- Shingrix (1 of 2) Never done   COVID-19 Vaccine (6 - Moderna risk 2024-25 season) 04/24/2024   Lung Cancer Screening  06/26/2024     Review of Systems 12 point ros is negative except for what is  mentioned above Physical Exam   BP 126/82   Pulse 68   Resp 16   Ht 5' 3 (1.6 m)   Wt 91 lb (41.3 kg)   SpO2 98%   BMI 16.12 kg/m   Physical Exam  Constitutional:  oriented to person, place, and time. appears well-developed and well-nourished. No distress.  HENT: [place]/AT, PERRLA, no scleral icterus Mouth/Throat: Oropharynx is clear and moist. No oropharyngeal exudate.  Cardiovascular: Normal rate, regular rhythm and normal heart sounds. Exam reveals no gallop and no friction rub.  No murmur heard.  Pulmonary/Chest: Effort normal and breath sounds normal. No respiratory distress.  has no wheezes.  Neck = supple, no nuchal rigidity Abdominal: Soft. Bowel sounds are normal.  exhibits no distension. There is no tenderness.  Lymphadenopathy: no cervical adenopathy. No axillary adenopathy Neurological: alert and oriented to person, place, and time.  Skin: Skin is warm and dry. No rash noted. No erythema.  Psychiatric: a normal mood and affect.  behavior is normal.   Lab Results  Component Value Date   CD4TCELL 43 12/26/2023   Lab Results  Component Value Date   CD4TABS 880 09/06/2023   CD4TABS 674 01/15/2023   CD4TABS 899 08/01/2021   Lab Results  Component Value Date   HIV1RNAQUANT Not Detected 12/26/2023   Lab Results  Component Value Date   HEPBSAB NEG 12/28/2014   Lab Results  Component Value Date   LABRPR NON-REACTIVE 12/26/2023    CBC Lab Results  Component Value Date   WBC 5.0 06/01/2024   RBC 3.54 (L) 06/01/2024   HGB 11.6 (L) 06/01/2024   HCT 35.3 (L) 06/01/2024   PLT 286 06/01/2024   MCV 99.7 06/01/2024   MCH 32.8 06/01/2024   MCHC 32.9 06/01/2024   RDW 14.2 06/01/2024   LYMPHSABS 2,314 09/06/2023   MONOABS 0.6 06/28/2022   EOSABS 20 12/26/2023    BMET Lab Results  Component Value Date   NA 136 06/01/2024   K 4.5 06/01/2024   CL 101 06/01/2024   CO2 24 06/01/2024   GLUCOSE 100 (H) 06/01/2024   BUN 8 06/01/2024   CREATININE 0.83 06/01/2024    CALCIUM  9.4 06/01/2024   GFRNONAA >60 06/01/2024   GFRAA 65 03/07/2021      Assessment and Plan  Hiv disease= is well controlled.in January. We will recheck cd 4 count and VL  Long term medication management = cr is stable on recent labs noted above  Constipation= recommend to take fiber to help with  Severe protein-calorie malnutrition = continue on 3 cans per day of ensure. Increase food intake

## 2024-06-05 DIAGNOSIS — B20 Human immunodeficiency virus [HIV] disease: Secondary | ICD-10-CM | POA: Diagnosis not present

## 2024-06-06 DIAGNOSIS — B20 Human immunodeficiency virus [HIV] disease: Secondary | ICD-10-CM | POA: Diagnosis not present

## 2024-06-09 ENCOUNTER — Other Ambulatory Visit: Payer: Self-pay | Admitting: Pharmacy Technician

## 2024-06-09 ENCOUNTER — Other Ambulatory Visit: Payer: Self-pay

## 2024-06-09 DIAGNOSIS — B20 Human immunodeficiency virus [HIV] disease: Secondary | ICD-10-CM | POA: Diagnosis not present

## 2024-06-09 NOTE — Progress Notes (Signed)
 Specialty Pharmacy Refill Coordination Note  Janice Williams is a 63 y.o. female contacted today regarding refills of specialty medication(s) Bictegravir-Emtricitab-Tenofov (Biktarvy )   Patient requested Delivery   Delivery date: 06/13/24   Verified address: [address]  [place]   Medication will be filled on 06/12/24.

## 2024-06-10 ENCOUNTER — Telehealth: Payer: Self-pay | Admitting: *Deleted

## 2024-06-10 DIAGNOSIS — B20 Human immunodeficiency virus [HIV] disease: Secondary | ICD-10-CM | POA: Diagnosis not present

## 2024-06-10 NOTE — Telephone Encounter (Unsigned)
 Copied from CRM [id number]. Topic: Clinical - Request for Lab/Test Order >> Jun 10, 2024 10:36 AM Alfonso ORN wrote: Reason for CRM: suppose to have a bone density test , order was placed by Norman lobstein in september on [place] [address] [facility] , patient  recieved a call that from the place that no longer do bone density test , would need another place to do her Bone density test   patient call back # [phone]

## 2024-06-11 ENCOUNTER — Other Ambulatory Visit: Payer: Self-pay

## 2024-06-11 DIAGNOSIS — B20 Human immunodeficiency virus [HIV] disease: Secondary | ICD-10-CM | POA: Diagnosis not present

## 2024-06-12 DIAGNOSIS — B20 Human immunodeficiency virus [HIV] disease: Secondary | ICD-10-CM | POA: Diagnosis not present

## 2024-06-13 DIAGNOSIS — B20 Human immunodeficiency virus [HIV] disease: Secondary | ICD-10-CM | POA: Diagnosis not present

## 2024-06-16 DIAGNOSIS — B20 Human immunodeficiency virus [HIV] disease: Secondary | ICD-10-CM | POA: Diagnosis not present

## 2024-06-17 DIAGNOSIS — B20 Human immunodeficiency virus [HIV] disease: Secondary | ICD-10-CM | POA: Diagnosis not present

## 2024-06-18 DIAGNOSIS — B20 Human immunodeficiency virus [HIV] disease: Secondary | ICD-10-CM | POA: Diagnosis not present

## 2024-06-19 DIAGNOSIS — B20 Human immunodeficiency virus [HIV] disease: Secondary | ICD-10-CM | POA: Diagnosis not present

## 2024-06-20 DIAGNOSIS — B20 Human immunodeficiency virus [HIV] disease: Secondary | ICD-10-CM | POA: Diagnosis not present

## 2024-06-23 DIAGNOSIS — B20 Human immunodeficiency virus [HIV] disease: Secondary | ICD-10-CM | POA: Diagnosis not present

## 2024-06-24 DIAGNOSIS — B20 Human immunodeficiency virus [HIV] disease: Secondary | ICD-10-CM | POA: Diagnosis not present

## 2024-06-25 DIAGNOSIS — B20 Human immunodeficiency virus [HIV] disease: Secondary | ICD-10-CM | POA: Diagnosis not present

## 2024-06-26 DIAGNOSIS — B20 Human immunodeficiency virus [HIV] disease: Secondary | ICD-10-CM | POA: Diagnosis not present

## 2024-06-27 DIAGNOSIS — B20 Human immunodeficiency virus [HIV] disease: Secondary | ICD-10-CM | POA: Diagnosis not present

## 2024-06-28 DIAGNOSIS — Z419 Encounter for procedure for purposes other than remedying health state, unspecified: Secondary | ICD-10-CM | POA: Diagnosis not present

## 2024-06-30 ENCOUNTER — Other Ambulatory Visit: Payer: Self-pay

## 2024-06-30 DIAGNOSIS — B20 Human immunodeficiency virus [HIV] disease: Secondary | ICD-10-CM | POA: Diagnosis not present

## 2024-06-30 NOTE — Progress Notes (Signed)
 Specialty Pharmacy Refill Coordination Note  Janice Williams is a 63 y.o. female contacted today regarding refills of specialty medication(s) Bictegravir-Emtricitab-Tenofov (Biktarvy )   Patient requested Delivery   Delivery date: 07/02/24   Verified address: (Patient-Rptd) [address]   Medication will be filled on 07/01/24.

## 2024-07-01 ENCOUNTER — Telehealth: Admitting: Physician Assistant

## 2024-07-01 ENCOUNTER — Telehealth: Payer: Self-pay

## 2024-07-01 DIAGNOSIS — J321 Chronic frontal sinusitis: Secondary | ICD-10-CM | POA: Diagnosis not present

## 2024-07-01 DIAGNOSIS — K219 Gastro-esophageal reflux disease without esophagitis: Secondary | ICD-10-CM

## 2024-07-01 DIAGNOSIS — B20 Human immunodeficiency virus [HIV] disease: Secondary | ICD-10-CM | POA: Diagnosis not present

## 2024-07-01 MED ORDER — ONDANSETRON 4 MG PO TBDP: 4.0000 mg | ORAL_TABLET | Freq: Three times a day (TID) | ORAL | 0 refills | Status: DC | PRN

## 2024-07-01 MED ORDER — CEFDINIR 300 MG PO CAPS: 300.0000 mg | ORAL_CAPSULE | Freq: Two times a day (BID) | ORAL | 0 refills | Status: DC

## 2024-07-01 MED ORDER — FAMOTIDINE 20 MG PO TABS: 20.0000 mg | ORAL_TABLET | Freq: Two times a day (BID) | ORAL | 0 refills | Status: DC

## 2024-07-01 NOTE — Progress Notes (Signed)
 Virtual Visit Consent   Janice Williams, you are scheduled for a virtual visit with a [facility] provider today. Just as with appointments in the office, your consent must be obtained to participate. Your consent will be active for this visit and any virtual visit you may have with one of our providers in the next 365 days. If you have a MyChart account, a copy of this consent can be sent to you electronically.  As this is a virtual visit, video technology does not allow for your provider to perform a traditional examination. This may limit your provider's ability to fully assess your condition. If your provider identifies any concerns that need to be evaluated in person or the need to arrange testing (such as labs, EKG, etc.), we will make arrangements to do so. Although advances in technology are sophisticated, we cannot ensure that it will always work on either your end or our end. If the connection with a video visit is poor, the visit may have to be switched to a telephone visit. With either a video or telephone visit, we are not always able to ensure that we have a secure connection.  By engaging in this virtual visit, you consent to the provision of healthcare and authorize for your insurance to be billed (if applicable) for the services provided during this visit. Depending on your insurance coverage, you may receive a charge related to this service.  I need to obtain your verbal consent now. Are you willing to proceed with your visit today? Lovette D Kosik has provided verbal consent on 07/01/2024 for a virtual visit (video or telephone). Janice Williams, [place]  Date: 07/01/2024 2:52 PM   Virtual Visit via Video Note   I, Janice Williams, connected with  EVAGELIA KNACK  ([id number], 1961-06-10) on 07/01/24 at  2:30 PM EDT by a video-enabled telemedicine application and verified that I am speaking with the correct person using two identifiers.  Location: Patient: Virtual Visit Location  Patient: Home Provider: Virtual Visit Location Provider: Home Office   I discussed the limitations of evaluation and management by telemedicine and the availability of in person appointments. The patient expressed understanding and agreed to proceed.    History of Present Illness: Janice Williams is a 63 y.o. who identifies as a female who was assigned female at birth, and is being seen today for some ongoing allergy  symptoms that have flared up recently, now with a few days of sinus pain in addition to the congestion and pressure. Denies fever, chills. Notes thick nasal discharge. Now from drainage noting increased reflux with nausea despite use of her Prilosec. Denies vomiting or diarrhea. Notes substantial anorexia. HPI: HPI  Problems:  Patient Active Problem List   Diagnosis Date Noted   Vitamin D  deficiency 08/08/2023   Rash of hand 12/21/2022   Former cigarette smoker 11/16/2022   Sprain of calcaneofibular ligament 02/28/2022   Degenerative disc disease, cervical 10/13/2020   Osteoporosis 10/10/2019   COPD  GOLD 3  07/08/2019   Moderate protein-calorie malnutrition (HCC) 05/31/2018   Hypertension 06/08/2017   Hyperlipidemia 06/03/2009   Human immunodeficiency virus (HIV) disease (HCC) 10/29/2008   GERD 10/29/2008   CEREBROVASCULAR ACCIDENT, HX OF 10/29/2008   Symptoms concerning nutrition, metabolism, and development 02/14/2007   Rhinitis, chronic 02/14/2007    Allergies:  Allergies  Allergen Reactions   Varenicline  Itching and Other (See Comments)   Medications:  Current Outpatient Medications:    cefdinir  (OMNICEF ) 300 MG capsule, Take  1 capsule (300 mg total) by mouth 2 (two) times daily., Disp: 20 capsule, Rfl: 0   famotidine  (PEPCID ) 20 MG tablet, Take 1 tablet (20 mg total) by mouth 2 (two) times daily., Disp: 30 tablet, Rfl: 0   ondansetron  (ZOFRAN -ODT) 4 MG disintegrating tablet, Take 1 tablet (4 mg total) by mouth every 8 (eight) hours as needed for nausea or  vomiting., Disp: 20 tablet, Rfl: 0   albuterol  (VENTOLIN  HFA) 108 (90 Base) MCG/ACT inhaler, Inhale 2 puffs into the lungs every 6 (six) hours as needed for wheezing or shortness of breath., Disp: 18 g, Rfl: 1   amLODipine  (NORVASC ) 10 MG tablet, Take 1 tablet (10 mg total) by mouth daily., Disp: 90 tablet, Rfl: 3   bictegravir-emtricitabine -tenofovir  AF (BIKTARVY ) 50-200-25 MG TABS tablet, Take 1 tablet by mouth daily., Disp: 30 tablet, Rfl: 5   Ensure (ENSURE), Take 237 mLs by mouth 3 (three) times daily between meals., Disp: 237 mL, Rfl: 5   EPIPEN  2-PAK 0.3 MG/0.3ML SOAJ injection, , Disp: , Rfl:    fluticasone  (FLONASE ) 50 MCG/ACT nasal spray, Use 1 spray(s) in each nostril once daily, Disp: 16 g, Rfl: 0   hydrocortisone  (ANUSOL -HC) 25 MG suppository, Place 1 suppository (25 mg total) rectally 2 (two) times daily., Disp: 12 suppository, Rfl: 0   levocetirizine (XYZAL ) 5 MG tablet, Take 1 tablet (5 mg total) by mouth every evening., Disp: 90 tablet, Rfl: 3   MIEBO 1.338 GM/ML SOLN, , Disp: , Rfl:    mirtazapine  (REMERON ) 15 MG tablet, Take 1 tablet (15 mg total) by mouth at bedtime., Disp: 30 tablet, Rfl: 11   mupirocin ointment (BACTROBAN) 2 %, Apply a small amount inside of both nostrils twice daily for 14 days., Disp: , Rfl:    Olopatadine  HCl 0.2 % SOLN, 1 drop into affected eye Ophthalmic Once a day, Disp: , Rfl:    omeprazole  (PRILOSEC) 20 MG capsule, Take 1 capsule (20 mg total) by mouth daily., Disp: 90 capsule, Rfl: 3   prednisoLONE acetate (PRED FORTE) 1 % ophthalmic suspension, 1 drop 4 (four) times daily. (Patient not taking: Reported on 06/04/2024), Disp: , Rfl:    PRESCRIPTION MEDICATION, Inject 1 Dose into the skin once a week. Allergy  shot in office on Fridays, Disp: , Rfl:    RESTASIS 0.05 % ophthalmic emulsion, 1 drop 2 (two) times daily., Disp: , Rfl:    rosuvastatin  (CRESTOR ) 40 MG tablet, Take 1 tablet (40 mg total) by mouth daily., Disp: 90 tablet, Rfl: 3    Spacer/Aero-Holding Chambers (AEROCHAMBER MV) inhaler, as directed for 30 days, Disp: , Rfl:    SYMBICORT  160-4.5 MCG/ACT inhaler, Inhale 2 puffs into the lungs 2 (two) times daily., Disp: , Rfl:    Tiotropium Bromide  Monohydrate (SPIRIVA  RESPIMAT) 2.5 MCG/ACT AERS, Inhale 2 puffs into the lungs daily., Disp: 4 g, Rfl: 11   traMADol  (ULTRAM ) 50 MG tablet, TAKE 1 TABLET BY MOUTH EVERY 6 HOURS AS NEEDED., Disp: 60 tablet, Rfl: 3   TYRVAYA  0.03 MG/ACT SOLN, , Disp: , Rfl:   Observations/Objective: Patient is well-developed, well-nourished in no acute distress.  Resting comfortably  at home.  Head is normocephalic, atraumatic.  No labored breathing.  Speech is clear and coherent with logical content.  Patient is alert and oriented at baseline.   Assessment and Plan: 1. Chronic frontal sinusitis (Primary) - cefdinir  (OMNICEF ) 300 MG capsule; Take 1 capsule (300 mg total) by mouth 2 (two) times daily.  Dispense: 20 capsule; Refill: 0  2.  Gastroesophageal reflux disease without esophagitis - famotidine  (PEPCID ) 20 MG tablet; Take 1 tablet (20 mg total) by mouth 2 (two) times daily.  Dispense: 30 tablet; Refill: 0 - ondansetron  (ZOFRAN -ODT) 4 MG disintegrating tablet; Take 1 tablet (4 mg total) by mouth every 8 (eight) hours as needed for nausea or vomiting.  Dispense: 20 tablet; Refill: 0  Supportive measures and OTC medications reviewed. Giving medication intolerances, will start Cefdinir  300 mg BID for 10 days. Will add on Famotidine  to her PPI, along with GERD diet. Zofran  as directed. ER precautions reviewed with patient.  Follow Up Instructions: I discussed the assessment and treatment plan with the patient. The patient was provided an opportunity to ask questions and all were answered. The patient agreed with the plan and demonstrated an understanding of the instructions.  A copy of instructions were sent to the patient via MyChart unless otherwise noted below.   The patient was advised to  call back or seek an in-person evaluation if the symptoms worsen or if the condition fails to improve as anticipated.    Janice Velma Lunger, PA-C

## 2024-07-01 NOTE — Patient Instructions (Signed)
 [name], thank you for joining [name], PA-C for today's virtual visit.  While this provider is not your primary care provider (PCP), if your PCP is located in our provider database this encounter information will be shared with them immediately following your visit.   A [facility] MyChart account gives you access to today's visit and all your visits, tests, and labs performed at [facility]  click here if you don't have a [facility] MyChart account or go to [email or web address]  Consent: (Patient) Arturo D Hobin provided verbal consent for this virtual visit at the beginning of the encounter.  Current Medications:  Current Outpatient Medications:    albuterol  (VENTOLIN  HFA) 108 (90 Base) MCG/ACT inhaler, Inhale 2 puffs into the lungs every 6 (six) hours as needed for wheezing or shortness of breath., Disp: 18 g, Rfl: 1   amLODipine  (NORVASC ) 10 MG tablet, Take 1 tablet (10 mg total) by mouth daily., Disp: 90 tablet, Rfl: 3   bictegravir-emtricitabine -tenofovir  AF (BIKTARVY ) 50-200-25 MG TABS tablet, Take 1 tablet by mouth daily., Disp: 30 tablet, Rfl: 5   Ensure (ENSURE), Take 237 mLs by mouth 3 (three) times daily between meals., Disp: 237 mL, Rfl: 5   EPIPEN  2-PAK 0.3 MG/0.3ML SOAJ injection, , Disp: , Rfl:    fluticasone  (FLONASE ) 50 MCG/ACT nasal spray, Use 1 spray(s) in each nostril once daily, Disp: 16 g, Rfl: 0   hydrocortisone  (ANUSOL -HC) 25 MG suppository, Place 1 suppository (25 mg total) rectally 2 (two) times daily., Disp: 12 suppository, Rfl: 0   levocetirizine (XYZAL ) 5 MG tablet, Take 1 tablet (5 mg total) by mouth every evening., Disp: 90 tablet, Rfl: 3   MIEBO 1.338 GM/ML SOLN, , Disp: , Rfl:    mirtazapine  (REMERON ) 15 MG tablet, Take 1 tablet (15 mg total) by mouth at bedtime., Disp: 30 tablet, Rfl: 11   mupirocin ointment (BACTROBAN) 2 %, Apply a small amount inside of both nostrils twice daily for 14 days., Disp: , Rfl:     Olopatadine  HCl 0.2 % SOLN, 1 drop into affected eye Ophthalmic Once a day, Disp: , Rfl:    omeprazole  (PRILOSEC) 20 MG capsule, Take 1 capsule (20 mg total) by mouth daily., Disp: 90 capsule, Rfl: 3   ondansetron  (ZOFRAN ) 4 MG tablet, Take 1 tablet (4 mg total) by mouth every 8 (eight) hours as needed for nausea or vomiting., Disp: 20 tablet, Rfl: 0   ondansetron  (ZOFRAN -ODT) 8 MG disintegrating tablet, Take 1 tablet (8 mg total) by mouth every 8 (eight) hours as needed for nausea., Disp: 20 tablet, Rfl: 0   prednisoLONE acetate (PRED FORTE) 1 % ophthalmic suspension, 1 drop 4 (four) times daily. (Patient not taking: Reported on 06/04/2024), Disp: , Rfl:    PRESCRIPTION MEDICATION, Inject 1 Dose into the skin once a week. Allergy  shot in office on Fridays, Disp: , Rfl:    RESTASIS 0.05 % ophthalmic emulsion, 1 drop 2 (two) times daily., Disp: , Rfl:    rosuvastatin  (CRESTOR ) 40 MG tablet, Take 1 tablet (40 mg total) by mouth daily., Disp: 90 tablet, Rfl: 3   Spacer/Aero-Holding Chambers (AEROCHAMBER MV) inhaler, as directed for 30 days, Disp: , Rfl:    SYMBICORT  160-4.5 MCG/ACT inhaler, Inhale 2 puffs into the lungs 2 (two) times daily., Disp: , Rfl:    Tiotropium Bromide  Monohydrate (SPIRIVA  RESPIMAT) 2.5 MCG/ACT AERS, Inhale 2 puffs into the lungs daily., Disp: 4 g, Rfl: 11   traMADol  (ULTRAM ) 50 MG tablet, TAKE 1 TABLET  BY MOUTH EVERY 6 HOURS AS NEEDED., Disp: 60 tablet, Rfl: 3   TYRVAYA  0.03 MG/ACT SOLN, , Disp: , Rfl:    Medications ordered in this encounter:  No orders of the defined types were placed in this encounter.    *If you need refills on other medications prior to your next appointment, please contact your pharmacy*  Follow-Up: Call back or seek an in-person evaluation if the symptoms worsen or if the condition fails to improve as anticipated.  [facility] [phone]  Other Instructions Please take antibiotic as directed.  Increase fluid intake.  Use Saline  nasal spray.  Take a daily multivitamin. Please add on the Famotidine  right now with your Prilosec to further control reflux symptoms. This should get better as the drainage is slowing down..  Place a humidifier in the bedroom.  If you note any non-resolving, new, or worsening symptoms despite treatment, please seek an in-person evaluation ASAP.   Sinusitis Sinusitis is redness, soreness, and swelling (inflammation) of the paranasal sinuses. Paranasal sinuses are air pockets within the bones of your face (beneath the eyes, the middle of the forehead, or above the eyes). In healthy paranasal sinuses, mucus is able to drain out, and air is able to circulate through them by way of your nose. However, when your paranasal sinuses are inflamed, mucus and air can become trapped. This can allow bacteria and other germs to grow and cause infection. Sinusitis can develop quickly and last only a short time (acute) or continue over a long period (chronic). Sinusitis that lasts for more than 12 weeks is considered chronic.  CAUSES  Causes of sinusitis include: Allergies. Structural abnormalities, such as displacement of the cartilage that separates your nostrils (deviated septum), which can decrease the air flow through your nose and sinuses and affect sinus drainage. Functional abnormalities, such as when the small hairs (cilia) that line your sinuses and help remove mucus do not work properly or are not present. SYMPTOMS  Symptoms of acute and chronic sinusitis are the same. The primary symptoms are pain and pressure around the affected sinuses. Other symptoms include: Upper toothache. Earache. Headache. Bad breath. Decreased sense of smell and taste. A cough, which worsens when you are lying flat. Fatigue. Fever. Thick drainage from your nose, which often is green and may contain pus (purulent). Swelling and warmth over the affected sinuses. DIAGNOSIS  Your caregiver will perform a physical exam.  During the exam, your caregiver may: Look in your nose for signs of abnormal growths in your nostrils (nasal polyps). Tap over the affected sinus to check for signs of infection. View the inside of your sinuses (endoscopy) with a special imaging device with a light attached (endoscope), which is inserted into your sinuses. If your caregiver suspects that you have chronic sinusitis, one or more of the following tests may be recommended: Allergy  tests. Nasal culture A sample of mucus is taken from your nose and sent to a lab and screened for bacteria. Nasal cytology A sample of mucus is taken from your nose and examined by your caregiver to determine if your sinusitis is related to an allergy . TREATMENT  Most cases of acute sinusitis are related to a viral infection and will resolve on their own within 10 days. Sometimes medicines are prescribed to help relieve symptoms (pain medicine, decongestants, nasal steroid sprays, or saline sprays).  However, for sinusitis related to a bacterial infection, your caregiver will prescribe antibiotic medicines. These are medicines that will help kill  the bacteria causing the infection.  Rarely, sinusitis is caused by a fungal infection. In theses cases, your caregiver will prescribe antifungal medicine. For some cases of chronic sinusitis, surgery is needed. Generally, these are cases in which sinusitis recurs more than 3 times per year, despite other treatments. HOME CARE INSTRUCTIONS  Drink plenty of water. Water helps thin the mucus so your sinuses can drain more easily. Use a humidifier. Inhale steam 3 to 4 times a day (for example, sit in the bathroom with the shower running). Apply a warm, moist washcloth to your face 3 to 4 times a day, or as directed by your caregiver. Use saline nasal sprays to help moisten and clean your sinuses. Take over-the-counter or prescription medicines for pain, discomfort, or fever only as directed by your caregiver. SEEK  IMMEDIATE MEDICAL CARE IF: You have increasing pain or severe headaches. You have nausea, vomiting, or drowsiness. You have swelling around your face. You have vision problems. You have a stiff neck. You have difficulty breathing. MAKE SURE YOU:  Understand these instructions. Will watch your condition. Will get help right away if you are not doing well or get worse. Document Released: 12/04/2005 Document Revised: 02/26/2012 Document Reviewed: 12/19/2011 [facility] Patient Information 2014 [place], [place].    If you have been instructed to have an in-person evaluation today at a local Urgent Care facility, please use the link below. It will take you to a list of all of our available [facility], including address, phone number and hours of operation. Please do not delay care.  [facility]  If you or a family member do not have a primary care provider, use the link below to schedule a visit and establish care. When you choose a [facility] primary care physician or advanced practice provider, you gain a long-term partner in health. Find a Primary Care Provider  Learn more about [facility]'s in-office and virtual care options: [facility] - Get Care Now

## 2024-07-01 NOTE — Telephone Encounter (Signed)
 Patient called with concerns of congested sinuses, nausea and vomiting x 2 days, and decreased appetite. Reports she has severe allergies that affect her sense of smell and makes her not want to eat.  She does not have transportation.   Assisted with scheduling virtual urgent care visit via MyChart to discuss her concerns with a provider.   Domonic Kimball, BSN, RN

## 2024-07-02 ENCOUNTER — Ambulatory Visit: Payer: Self-pay

## 2024-07-02 ENCOUNTER — Other Ambulatory Visit: Payer: Self-pay

## 2024-07-02 DIAGNOSIS — B20 Human immunodeficiency virus [HIV] disease: Secondary | ICD-10-CM | POA: Diagnosis not present

## 2024-07-02 NOTE — Progress Notes (Signed)
 Patient Satisfaction Survey Complete.

## 2024-07-03 DIAGNOSIS — B20 Human immunodeficiency virus [HIV] disease: Secondary | ICD-10-CM | POA: Diagnosis not present

## 2024-07-04 DIAGNOSIS — B20 Human immunodeficiency virus [HIV] disease: Secondary | ICD-10-CM | POA: Diagnosis not present

## 2024-07-07 ENCOUNTER — Telehealth: Admitting: Physician Assistant

## 2024-07-07 ENCOUNTER — Telehealth: Payer: Self-pay

## 2024-07-07 DIAGNOSIS — J019 Acute sinusitis, unspecified: Secondary | ICD-10-CM

## 2024-07-07 DIAGNOSIS — B9689 Other specified bacterial agents as the cause of diseases classified elsewhere: Secondary | ICD-10-CM | POA: Diagnosis not present

## 2024-07-07 DIAGNOSIS — B20 Human immunodeficiency virus [HIV] disease: Secondary | ICD-10-CM | POA: Diagnosis not present

## 2024-07-07 DIAGNOSIS — B370 Candidal stomatitis: Secondary | ICD-10-CM

## 2024-07-07 MED ORDER — NYSTATIN 100000 UNIT/ML MT SUSP: 5.0000 mL | Freq: Four times a day (QID) | OROMUCOSAL | 0 refills | Status: DC

## 2024-07-07 MED ORDER — AZITHROMYCIN 250 MG PO TABS: ORAL_TABLET | ORAL | 0 refills | Status: AC

## 2024-07-07 NOTE — Progress Notes (Signed)
 Virtual Visit Consent   BERNIE FOBES, you are scheduled for a virtual visit with a [facility] provider today. Just as with appointments in the office, your consent must be obtained to participate. Your consent will be active for this visit and any virtual visit you may have with one of our providers in the next 365 days. If you have a MyChart account, a copy of this consent can be sent to you electronically.  As this is a virtual visit, video technology does not allow for your provider to perform a traditional examination. This may limit your provider's ability to fully assess your condition. If your provider identifies any concerns that need to be evaluated in person or the need to arrange testing (such as labs, EKG, etc.), we will make arrangements to do so. Although advances in technology are sophisticated, we cannot ensure that it will always work on either your end or our end. If the connection with a video visit is poor, the visit may have to be switched to a telephone visit. With either a video or telephone visit, we are not always able to ensure that we have a secure connection.  By engaging in this virtual visit, you consent to the provision of healthcare and authorize for your insurance to be billed (if applicable) for the services provided during this visit. Depending on your insurance coverage, you may receive a charge related to this service.  I need to obtain your verbal consent now. Are you willing to proceed with your visit today? Jennye D Varma has provided verbal consent on 07/07/2024 for a virtual visit (video or telephone). Delon CHRISTELLA Dickinson, PA-C  Date: 07/07/2024 12:51 PM   Virtual Visit via Video Note   IDelon CHRISTELLA Dickinson, connected with  LANCE GALAS  ([id number], October 09, 1961) on 07/07/24 at 12:30 PM EDT by a video-enabled telemedicine application and verified that I am speaking with the correct person using two identifiers.  Location: Patient: Virtual Visit Location  Patient: Home Provider: Virtual Visit Location Provider: Home Office   I discussed the limitations of evaluation and management by telemedicine and the availability of in person appointments. The patient expressed understanding and agreed to proceed.    History of Present Illness: NALEAH KOFOED is a 63 y.o. who identifies as a female who was assigned female at birth, and is being seen today for continued sinus congestion.  HPI: Sinusitis This is a chronic problem. The current episode started 1 to 4 weeks ago (Seen Virtually on 07/01/24 and given Cefdinir  300mg  for sinus infection, zofran  for nausea; Reports symptoms are worsening. Feels not tolerating cefdinir  well; Nausea and vomiting has been an ongoing issue since June 2025). The problem is unchanged. There has been no fever. The pain is moderate. Associated symptoms include congestion, ear pain, headaches, sinus pressure and a sore throat (dry throat). Pertinent negatives include no chills, coughing, diaphoresis or hoarse voice. (Dry mouth, post nasal drainage, white film covering tongue) Past treatments include antibiotics and saline nose sprays (cefdinir , flonase , allergy  meds). The treatment provided no relief.  PMH: HIV, controlled    Problems:  Patient Active Problem List   Diagnosis Date Noted   Vitamin D  deficiency 08/08/2023   Rash of hand 12/21/2022   Former cigarette smoker 11/16/2022   Sprain of calcaneofibular ligament 02/28/2022   Degenerative disc disease, cervical 10/13/2020   Osteoporosis 10/10/2019   COPD  GOLD 3  07/08/2019   Moderate protein-calorie malnutrition (HCC) 05/31/2018   Hypertension 06/08/2017  Hyperlipidemia 06/03/2009   Human immunodeficiency virus (HIV) disease (HCC) 10/29/2008   GERD 10/29/2008   CEREBROVASCULAR ACCIDENT, HX OF 10/29/2008   Symptoms concerning nutrition, metabolism, and development 02/14/2007   Rhinitis, chronic 02/14/2007    Allergies:  Allergies  Allergen Reactions    Varenicline  Itching and Other (See Comments)   Medications:  Current Outpatient Medications:    azithromycin  (ZITHROMAX ) 250 MG tablet, Take 2 tablets on day 1, then 1 tablet daily on days 2 through 5, Disp: 6 tablet, Rfl: 0   nystatin  (MYCOSTATIN ) 100000 UNIT/ML suspension, Take 5 mLs (500,000 Units total) by mouth 4 (four) times daily., Disp: 120 mL, Rfl: 0   albuterol  (VENTOLIN  HFA) 108 (90 Base) MCG/ACT inhaler, Inhale 2 puffs into the lungs every 6 (six) hours as needed for wheezing or shortness of breath., Disp: 18 g, Rfl: 1   amLODipine  (NORVASC ) 10 MG tablet, Take 1 tablet (10 mg total) by mouth daily., Disp: 90 tablet, Rfl: 3   bictegravir-emtricitabine -tenofovir  AF (BIKTARVY ) 50-200-25 MG TABS tablet, Take 1 tablet by mouth daily., Disp: 30 tablet, Rfl: 5   cefdinir  (OMNICEF ) 300 MG capsule, Take 1 capsule (300 mg total) by mouth 2 (two) times daily., Disp: 20 capsule, Rfl: 0   Ensure (ENSURE), Take 237 mLs by mouth 3 (three) times daily between meals., Disp: 237 mL, Rfl: 5   EPIPEN  2-PAK 0.3 MG/0.3ML SOAJ injection, , Disp: , Rfl:    famotidine  (PEPCID ) 20 MG tablet, Take 1 tablet (20 mg total) by mouth 2 (two) times daily., Disp: 30 tablet, Rfl: 0   fluticasone  (FLONASE ) 50 MCG/ACT nasal spray, Use 1 spray(s) in each nostril once daily, Disp: 16 g, Rfl: 0   hydrocortisone  (ANUSOL -HC) 25 MG suppository, Place 1 suppository (25 mg total) rectally 2 (two) times daily., Disp: 12 suppository, Rfl: 0   levocetirizine (XYZAL ) 5 MG tablet, Take 1 tablet (5 mg total) by mouth every evening., Disp: 90 tablet, Rfl: 3   MIEBO 1.338 GM/ML SOLN, , Disp: , Rfl:    mirtazapine  (REMERON ) 15 MG tablet, Take 1 tablet (15 mg total) by mouth at bedtime., Disp: 30 tablet, Rfl: 11   mupirocin ointment (BACTROBAN) 2 %, Apply a small amount inside of both nostrils twice daily for 14 days., Disp: , Rfl:    Olopatadine  HCl 0.2 % SOLN, 1 drop into affected eye Ophthalmic Once a day, Disp: , Rfl:    omeprazole   (PRILOSEC) 20 MG capsule, Take 1 capsule (20 mg total) by mouth daily., Disp: 90 capsule, Rfl: 3   ondansetron  (ZOFRAN -ODT) 4 MG disintegrating tablet, Take 1 tablet (4 mg total) by mouth every 8 (eight) hours as needed for nausea or vomiting., Disp: 20 tablet, Rfl: 0   prednisoLONE acetate (PRED FORTE) 1 % ophthalmic suspension, 1 drop 4 (four) times daily. (Patient not taking: Reported on 06/04/2024), Disp: , Rfl:    PRESCRIPTION MEDICATION, Inject 1 Dose into the skin once a week. Allergy  shot in office on Fridays, Disp: , Rfl:    RESTASIS 0.05 % ophthalmic emulsion, 1 drop 2 (two) times daily., Disp: , Rfl:    rosuvastatin  (CRESTOR ) 40 MG tablet, Take 1 tablet (40 mg total) by mouth daily., Disp: 90 tablet, Rfl: 3   Spacer/Aero-Holding Chambers (AEROCHAMBER MV) inhaler, as directed for 30 days, Disp: , Rfl:    SYMBICORT  160-4.5 MCG/ACT inhaler, Inhale 2 puffs into the lungs 2 (two) times daily., Disp: , Rfl:    Tiotropium Bromide  Monohydrate (SPIRIVA  RESPIMAT) 2.5 MCG/ACT AERS, Inhale 2 puffs  into the lungs daily., Disp: 4 g, Rfl: 11   traMADol  (ULTRAM ) 50 MG tablet, TAKE 1 TABLET BY MOUTH EVERY 6 HOURS AS NEEDED., Disp: 60 tablet, Rfl: 3   TYRVAYA  0.03 MG/ACT SOLN, , Disp: , Rfl:   Observations/Objective: Patient is well-developed, well-nourished in no acute distress.  Resting comfortably at home.  Head is normocephalic, atraumatic.  No labored breathing.  Speech is clear and coherent with logical content.  Patient is alert and oriented at baseline.    Assessment and Plan: 1. Acute bacterial sinusitis (Primary) - azithromycin  (ZITHROMAX ) 250 MG tablet; Take 2 tablets on day 1, then 1 tablet daily on days 2 through 5  Dispense: 6 tablet; Refill: 0  2. Thrush - nystatin  (MYCOSTATIN ) 100000 UNIT/ML suspension; Take 5 mLs (500,000 Units total) by mouth 4 (four) times daily.  Dispense: 120 mL; Refill: 0  - Worsening over a week despite OTC and prescription medications - Will treat with  Z-pack ; STOP Cefdinir  - Nystatin  added for suspected thrush secondary from recent antibiotic use that may be worsening symptoms as well - Can add Mucinex  (PLAIN) - Continue nasal spray (Flonase  or Nasacort ) - Saline nasal rinses as often as needed - Push fluids.  - Rest.  - Steam and humidifier can help - Seek in person evaluation if worsening or symptoms fail to improve    Follow Up Instructions: I discussed the assessment and treatment plan with the patient. The patient was provided an opportunity to ask questions and all were answered. The patient agreed with the plan and demonstrated an understanding of the instructions.  A copy of instructions were sent to the patient via MyChart unless otherwise noted below.    The patient was advised to call back or seek an in-person evaluation if the symptoms worsen or if the condition fails to improve as anticipated.    Delon CHRISTELLA Dickinson, PA-C

## 2024-07-07 NOTE — Telephone Encounter (Signed)
 Patient called office to follow up on virtual appt she had on 7/15. Was started on Cefinir/ zofran  for nausea/ vomitting and Sinusitis. Patient has had increase nausea/ vomiting since that appt.  Is requesting alternative treatment options. Advised patient to go to Urgent Care for evaluation since we are not able to see her in clinic. Is currently not working with a PCP.  Verbalized understanding. Lorenda CHRISTELLA Code, RMA

## 2024-07-07 NOTE — Patient Instructions (Signed)
 [name], Janice you for joining Delon Janice Williams, [name]  While this provider is not your primary care provider (PCP), if your PCP is located in our provider database this encounter information will be shared with them immediately following your Williams.   A [facility] MyChart account gives you access to today's Williams and all your visits, tests, and labs performed at [facility]  click here if you don't have a [facility] MyChart account or go to [email or web address]  Consent: (Patient) Janice Williams provided verbal consent for this virtual Williams at the beginning of the encounter.  Current Medications:  Current Outpatient Medications:    azithromycin  (ZITHROMAX ) 250 MG tablet, Take 2 tablets on day 1, then 1 tablet daily on days 2 through 5, Disp: 6 tablet, Rfl: 0   nystatin  (MYCOSTATIN ) 100000 UNIT/ML suspension, Take 5 mLs (500,000 Units total) by mouth 4 (four) times daily., Disp: 120 mL, Rfl: 0   albuterol  (VENTOLIN  HFA) 108 (90 Base) MCG/ACT inhaler, Inhale 2 puffs into the lungs every 6 (six) hours as needed for wheezing or shortness of breath., Disp: 18 g, Rfl: 1   amLODipine  (NORVASC ) 10 MG tablet, Take 1 tablet (10 mg total) by mouth daily., Disp: 90 tablet, Rfl: 3   bictegravir-emtricitabine -tenofovir  AF (BIKTARVY ) 50-200-25 MG TABS tablet, Take 1 tablet by mouth daily., Disp: 30 tablet, Rfl: 5   cefdinir  (OMNICEF ) 300 MG capsule, Take 1 capsule (300 mg total) by mouth 2 (two) times daily., Disp: 20 capsule, Rfl: 0   Ensure (ENSURE), Take 237 mLs by mouth 3 (three) times daily between meals., Disp: 237 mL, Rfl: 5   EPIPEN  2-PAK 0.3 MG/0.3ML SOAJ injection, , Disp: , Rfl:    famotidine  (PEPCID ) 20 MG tablet, Take 1 tablet (20 mg total) by mouth 2 (two) times daily., Disp: 30 tablet, Rfl: 0   fluticasone  (FLONASE ) 50 MCG/ACT nasal spray, Use 1 spray(s) in each nostril once daily, Disp: 16 g, Rfl: 0   hydrocortisone  (ANUSOL -HC) 25 MG  suppository, Place 1 suppository (25 mg total) rectally 2 (two) times daily., Disp: 12 suppository, Rfl: 0   levocetirizine (XYZAL ) 5 MG tablet, Take 1 tablet (5 mg total) by mouth every evening., Disp: 90 tablet, Rfl: 3   MIEBO 1.338 GM/ML SOLN, , Disp: , Rfl:    mirtazapine  (REMERON ) 15 MG tablet, Take 1 tablet (15 mg total) by mouth at bedtime., Disp: 30 tablet, Rfl: 11   mupirocin ointment (BACTROBAN) 2 %, Apply a small amount inside of both nostrils twice daily for 14 days., Disp: , Rfl:    Olopatadine  HCl 0.2 % SOLN, 1 drop into affected eye Ophthalmic Once a day, Disp: , Rfl:    omeprazole  (PRILOSEC) 20 MG capsule, Take 1 capsule (20 mg total) by mouth daily., Disp: 90 capsule, Rfl: 3   ondansetron  (ZOFRAN -ODT) 4 MG disintegrating tablet, Take 1 tablet (4 mg total) by mouth every 8 (eight) hours as needed for nausea or vomiting., Disp: 20 tablet, Rfl: 0   prednisoLONE acetate (PRED FORTE) 1 % ophthalmic suspension, 1 drop 4 (four) times daily. (Patient not taking: Reported on 06/04/2024), Disp: , Rfl:    PRESCRIPTION MEDICATION, Inject 1 Dose into the skin once a week. Allergy  shot in office on Fridays, Disp: , Rfl:    RESTASIS 0.05 % ophthalmic emulsion, 1 drop 2 (two) times daily., Disp: , Rfl:    rosuvastatin  (CRESTOR ) 40 MG tablet, Take 1 tablet (40 mg total) by mouth daily., Disp: 90  tablet, Rfl: 3   Spacer/Aero-Holding Chambers (AEROCHAMBER MV) inhaler, as directed for 30 days, Disp: , Rfl:    SYMBICORT  160-4.5 MCG/ACT inhaler, Inhale 2 puffs into the lungs 2 (two) times daily., Disp: , Rfl:    Tiotropium Bromide  Monohydrate (SPIRIVA  RESPIMAT) 2.5 MCG/ACT AERS, Inhale 2 puffs into the lungs daily., Disp: 4 g, Rfl: 11   traMADol  (ULTRAM ) 50 MG tablet, TAKE 1 TABLET BY MOUTH EVERY 6 HOURS AS NEEDED., Disp: 60 tablet, Rfl: 3   TYRVAYA  0.03 MG/ACT SOLN, , Disp: , Rfl:    Medications ordered in this encounter:  Meds ordered this encounter  Medications   azithromycin  (ZITHROMAX ) 250 MG  tablet    Sig: Take 2 tablets on day 1, then 1 tablet daily on days 2 through 5    Dispense:  6 tablet    Refill:  0    Supervising Provider:   LAMPTEY, PHILIP O [[id number]]   nystatin  (MYCOSTATIN ) 100000 UNIT/ML suspension    Sig: Take 5 mLs (500,000 Units total) by mouth 4 (four) times daily.    Dispense:  120 mL    Refill:  0    Supervising Provider:   BLAISE ALEENE KIDD [[id number]]     *If you need refills on other medications prior to your next appointment, please contact your pharmacy*  Follow-Up: Call back or seek an in-person evaluation if the symptoms worsen or if the condition fails to improve as anticipated.  [facility] [phone]  Other Instructions Sinus Pain  Sinus pain may occur when your sinuses become clogged or swollen. Sinuses are air-filled spaces in your skull that are behind the bones of your face and forehead. Sinus pain can range from mild to severe. What are the causes? Sinus pain can result from various conditions that affect the sinuses. Common causes include: Colds. Sinus infections. Allergies. What are the signs or symptoms? The main symptom of this condition is pain or pressure in your face, forehead, ears, or upper teeth. People who have sinus pain often have other symptoms, such as: Congested or runny nose. Fever. Inability to smell. Headache. Weather changes can make symptoms worse. How is this diagnosed? Your health care provider will diagnose this condition based on your symptoms and a physical exam. If you have pain that keeps coming back or does not go away, your health care provider may recommend more testing. This may include: Imaging tests, such as a CT scan or MRI, to check for problems with your sinuses. Examination of your sinuses using a thin tool with a camera that is inserted through your nose (endoscopy). How is this treated? Treatment for this condition depends on the cause. Sinus pain that is caused by a sinus  infection may be treated with antibiotic medicine. Sinus pain that is caused by congestion may be helped by rinsing out (flushing) the nose and sinuses with saline solution. Sinus pain that is caused by allergies may be helped by allergy  medicines (antihistamines) and medicated nasal sprays. Sinus surgery may be needed in some cases if other treatments do not help. Follow these instructions at home: General instructions If directed: Apply a warm, moist washcloth to your face to help relieve pain. Use a nasal saline wash. Follow the directions on the bottle or box. Hydrate and humidify Drink enough water to keep your urine clear or pale yellow. Staying hydrated will help to thin your mucus. Use a humidifier if your home is dry. Inhale steam for 10-15 minutes, 3-4 times a  day or as told by your health care provider. You can do this in the bathroom while a hot shower is running. Limit your exposure to cool or dry air. Medicines  Take over-the-counter and prescription medicines only as told by your health care provider. If you were prescribed an antibiotic medicine, take it as told by your health care provider. Do not stop taking the antibiotic even if you start to feel better. If you have congestion, use a nasal spray to help lessen pressure. Contact a health care provider if: You have sinus pain more than one time a week. You have sensitivity to light or sound. You develop a fever. You feel nauseous or you vomit. Your sinus pain or headache does not get better with treatment. Get help right away if: You have vision problems. You have sudden, severe pain in your face or head. You have a seizure. You are confused. You have a stiff neck. Summary Sinus pain occurs when your sinuses become clogged or swollen. Sinus pain can result from various conditions that affect the sinuses, such as a cold, a sinus infection, or an allergy . Treatment for this condition depends on the cause. It may  include medicine, such as antibiotics or antihistamines. This information is not intended to replace advice given to you by your health care provider. Make sure you discuss any questions you have with your health care provider. Document Revised: 11/06/2021 Document Reviewed: 11/06/2021 Elsevier Patient Education  2024 [employer]   If you have been instructed to have an in-person evaluation today at a local Urgent Care facility, please use the link below. It will take you to a list of all of our available [facility], including address, phone number and hours of operation. Please do not delay care.  [facility]  If you or a family member do not have a primary care provider, use the link below to schedule a Williams and establish care. When you choose a [facility] primary care physician or advanced practice provider, you gain a long-term partner in health. Find a Primary Care Provider  Learn more about [facility]'s in-office and virtual care options:  - Get Care Now

## 2024-07-08 ENCOUNTER — Ambulatory Visit: Payer: Self-pay

## 2024-07-08 DIAGNOSIS — B20 Human immunodeficiency virus [HIV] disease: Secondary | ICD-10-CM | POA: Diagnosis not present

## 2024-07-09 DIAGNOSIS — B20 Human immunodeficiency virus [HIV] disease: Secondary | ICD-10-CM | POA: Diagnosis not present

## 2024-07-10 DIAGNOSIS — B20 Human immunodeficiency virus [HIV] disease: Secondary | ICD-10-CM | POA: Diagnosis not present

## 2024-07-11 ENCOUNTER — Ambulatory Visit
Admission: EM | Admit: 2024-07-11 | Discharge: 2024-07-11 | Disposition: A | Discharge status: Home / Self Care | Attending: Emergency Medicine | Admitting: Emergency Medicine

## 2024-07-11 ENCOUNTER — Encounter: Payer: Self-pay | Admitting: Emergency Medicine

## 2024-07-11 ENCOUNTER — Telehealth: Payer: Self-pay | Admitting: Emergency Medicine

## 2024-07-11 DIAGNOSIS — J321 Chronic frontal sinusitis: Secondary | ICD-10-CM

## 2024-07-11 DIAGNOSIS — H1045 Other chronic allergic conjunctivitis: Secondary | ICD-10-CM | POA: Insufficient documentation

## 2024-07-11 DIAGNOSIS — R112 Nausea with vomiting, unspecified: Secondary | ICD-10-CM

## 2024-07-11 DIAGNOSIS — J3081 Allergic rhinitis due to animal (cat) (dog) hair and dander: Secondary | ICD-10-CM | POA: Insufficient documentation

## 2024-07-11 DIAGNOSIS — K649 Unspecified hemorrhoids: Secondary | ICD-10-CM

## 2024-07-11 DIAGNOSIS — J3089 Other allergic rhinitis: Secondary | ICD-10-CM | POA: Insufficient documentation

## 2024-07-11 DIAGNOSIS — B20 Human immunodeficiency virus [HIV] disease: Secondary | ICD-10-CM | POA: Diagnosis not present

## 2024-07-11 DIAGNOSIS — J4530 Mild persistent asthma, uncomplicated: Secondary | ICD-10-CM | POA: Insufficient documentation

## 2024-07-11 MED ORDER — DOCUSATE SODIUM 100 MG PO CAPS: 100.0000 mg | ORAL_CAPSULE | Freq: Two times a day (BID) | ORAL | 0 refills | Status: DC

## 2024-07-11 MED ORDER — HYDROCORTISONE (PERIANAL) 2.5 % EX CREA: 1.0000 | TOPICAL_CREAM | Freq: Two times a day (BID) | CUTANEOUS | 0 refills | Status: DC

## 2024-07-11 MED ORDER — LIDOCAINE-HYDROCORTISONE ACE 1-3 % RE KIT: 1.0000 | PACK | Freq: Two times a day (BID) | RECTAL | 0 refills | Status: DC | PRN

## 2024-07-11 MED ORDER — FLUCONAZOLE 200 MG PO TABS: ORAL_TABLET | ORAL | 0 refills | Status: DC

## 2024-07-11 MED ORDER — METOCLOPRAMIDE HCL 5 MG/ML IJ SOLN
5.0000 mg | Freq: Once | INTRAMUSCULAR | Status: AC
  Administered 2024-07-11: 5 mg via INTRAMUSCULAR

## 2024-07-11 MED ORDER — ONDANSETRON 4 MG PO TBDP: 4.0000 mg | ORAL_TABLET | Freq: Once | ORAL | Status: DC

## 2024-07-11 NOTE — Discharge Instructions (Signed)
 We have given you Reglan to help with your nausea.  Over the next hour or so please start sipping on water.  I believe some of your problem is from a oral thrush, take the Diflucan as prescribed.  Take 2 tablets today and then 1 tablet daily for the next 10 days to help treat your oral thrush.  You have a thrombosed hemorrhoid.  Apply the lidocaine  hydrocortisone  up to 2 times daily, do not use this for longer than 7 days in a row.  Ensure you are eating a diet high in fiber, you can consider starting a stool softener as well and ensure you are drinking at least 64 ounces of water daily.  Please follow-up with your primary care provider if your chronic sinus issues persist.  Seek immediate care if you are unable to hold down food or fluids at the nearest emergency department.

## 2024-07-11 NOTE — ED Provider Notes (Signed)
 [facility]    CSN: [id number] Arrival date & time: 07/11/24  0905      History   Chief Complaint Chief Complaint  Patient presents with   Nausea   dry heaving     HPI Janice Williams is a 63 y.o. female.   Patient presents to clinic with multiple concerns.  She is having ongoing struggles with hemorrhoids, reports she has followed up with [facility] and they told her she was not eligible for surgery because it is both internal and external.  She was sent in a hydrocortisone  suppository but was unable to get this medication due to expense.  Is not having any bleeding, continues to have rectal pain and is having difficulty sitting normally due to the pain in her rectum.  Has continued sinus pain and pressure.  Reports her sinuses are so dry she is having a lot of pain.  Was recently treated with the cefdinir  and is finishing up her azithromycin  for sinusitis.  Does follow with ENT.   Has had nausea and vomiting that appears to be ongoing since June.  It seems to be exacerbated by antibiotics.  At one point she was treated for oral thrush with nystatin  swish and spit, this did not help.  She feels like her entire tongue is white and has a foul taste in her mouth.  Does have a history of HIV, well-controlled.  She is concerned because her overall appetite has been diminished and she has been losing weight.  Has tried Zofran  in the past for her nausea without any improvement.  The history is provided by the patient and medical records.    Past Medical History:  Diagnosis Date   Anemia    Arthritis    generalized   Blood transfusion without reported diagnosis 2003   Bronchitis    Cataract    COPD (chronic obstructive pulmonary disease) (HCC)    COPD with acute exacerbation (HCC) 06/09/2017   Dental abscess 03/15/2015   DYSPEPSIA 02/14/2007   Qualifier: Diagnosis of  By: Manford Longs     EXTERNAL OTITIS 01/06/2010   Qualifier: Diagnosis of  By: Janna  MD, Burnard     FACIAL RASH 02/04/2009   Qualifier: Diagnosis of  By: Cleotilde Pencil     Former smoker    quit 2014   GERD (gastroesophageal reflux disease)    Glaucoma    HIV infection (HCC)    Hypertension    Osteoporosis    Seasonal allergies    Stroke (HCC) 2003   Substance abuse (HCC)    history, clean 7 years   SVD (spontaneous vaginal delivery)    x 3    Patient Active Problem List   Diagnosis Date Noted   Allergic rhinitis due to animal hair and dander 07/11/2024   Mild persistent asthma, uncomplicated 07/11/2024   Other chronic allergic conjunctivitis 07/11/2024   Other allergic rhinitis 07/11/2024   Vitamin D  deficiency 08/08/2023   Rash of hand 12/21/2022   Former cigarette smoker 11/16/2022   Sprain of calcaneofibular ligament 02/28/2022   Degenerative disc disease, cervical 10/13/2020   Osteoporosis 10/10/2019   COPD  GOLD 3  07/08/2019   Moderate protein-calorie malnutrition (HCC) 05/31/2018   Hypertension 06/08/2017   Hyperlipidemia 06/03/2009   Human immunodeficiency virus (HIV) disease (HCC) 10/29/2008   GERD 10/29/2008   CEREBROVASCULAR ACCIDENT, HX OF 10/29/2008   Symptoms concerning nutrition, metabolism, and development 02/14/2007   Rhinitis, chronic 02/14/2007    Past Surgical History:  Procedure Laterality Date   COLONOSCOPY  2019   KN-MAC-suprep(adeq)-tics/hems/TA x 5   MULTIPLE TOOTH EXTRACTIONS     with sedation   POLYPECTOMY  01/2008   TA x 5   TONSILLECTOMY  1973   TUBAL LIGATION     UPPER GI ENDOSCOPY     normal per patient - years ago    OB History     Gravida  3   Para  3   Term  3   Preterm      AB      Living  3      SAB      IAB      Ectopic      Multiple      Live Births  3            Home Medications    Prior to Admission medications   Medication Sig Start Date End Date Taking? Authorizing Provider  amLODipine  (NORVASC ) 10 MG tablet Take 1 tablet (10 mg total) by mouth daily. 03/19/24 03/19/25  Yes Marylu Gee, DO  azithromycin  (ZITHROMAX ) 250 MG tablet Take 2 tablets on day 1, then 1 tablet daily on days 2 through 5 07/07/24 07/12/24 Yes Burnette, Delon HERO, PA-C  bictegravir-emtricitabine -tenofovir  AF (BIKTARVY ) 50-200-25 MG TABS tablet Take 1 tablet by mouth daily. 02/27/24  Yes Luiz Channel, MD  cefdinir  (OMNICEF ) 300 MG capsule Take 1 capsule (300 mg total) by mouth 2 (two) times daily. 07/01/24  Yes Gladis Elsie BROCKS, PA-C  docusate sodium  (COLACE) 100 MG capsule Take 1 capsule (100 mg total) by mouth every 12 (twelve) hours. 07/11/24  Yes Deette Revak  N, FNP  Ensure (ENSURE) Take 237 mLs by mouth 3 (three) times daily between meals. 12/20/23  Yes Luiz Channel, MD  famotidine  (PEPCID ) 20 MG tablet Take 1 tablet (20 mg total) by mouth 2 (two) times daily. 07/01/24  Yes Gladis Elsie BROCKS, PA-C  fluconazole (DIFLUCAN) 200 MG tablet Take 2 tablets today and then 1 tablet daily for the next 10 days. 07/11/24  Yes Dreama, Nekesha Font  N, FNP  fluticasone  (FLONASE ) 50 MCG/ACT nasal spray Use 1 spray(s) in each nostril once daily 06/04/24  Yes Luiz Channel, MD  levocetirizine (XYZAL ) 5 MG tablet Take 1 tablet (5 mg total) by mouth every evening. 12/26/23  Yes Luiz Channel, MD  Lidocaine -Hydrocortisone  Ace 1-3 % KIT Place 1 Application rectally 2 (two) times daily as needed. 07/11/24  Yes Dreama, Bynum Mccullars  N, FNP  MIEBO 1.338 GM/ML SOLN  02/25/24  Yes [provider]  mirtazapine  (REMERON ) 15 MG tablet Take 1 tablet (15 mg total) by mouth at bedtime. 06/04/24  Yes Luiz Channel, MD  nystatin  (MYCOSTATIN ) 100000 UNIT/ML suspension Take 5 mLs (500,000 Units total) by mouth 4 (four) times daily. 07/07/24  Yes Vivienne Delon HERO, PA-C  Olopatadine  HCl 0.2 % SOLN 1 drop into affected eye Ophthalmic Once a day   Yes [provider]  omeprazole  (PRILOSEC) 20 MG capsule Take 1 capsule (20 mg total) by mouth daily. 02/13/24  Yes Jolaine Pac, DO  ondansetron  (ZOFRAN -ODT) 4 MG  disintegrating tablet Take 1 tablet (4 mg total) by mouth every 8 (eight) hours as needed for nausea or vomiting. 07/01/24  Yes Gladis Elsie BROCKS, PA-C  RESTASIS 0.05 % ophthalmic emulsion 1 drop 2 (two) times daily. 08/05/21  Yes [provider]  SYMBICORT  160-4.5 MCG/ACT inhaler Inhale 2 puffs into the lungs 2 (two) times daily. 01/13/24  Yes [provider]  Tiotropium Bromide  Monohydrate (SPIRIVA   RESPIMAT) 2.5 MCG/ACT AERS Inhale 2 puffs into the lungs daily. 02/12/24  Yes Jolaine Pac, DO  albuterol  (VENTOLIN  HFA) 108 (90 Base) MCG/ACT inhaler Inhale 2 puffs into the lungs every 6 (six) hours as needed for wheezing or shortness of breath. 02/28/23   Rudy Sieving, MD  EPIPEN  2-PAK 0.3 MG/0.3ML SOAJ injection  03/21/22   [provider]  hydrocortisone  (ANUSOL -HC) 25 MG suppository Place 1 suppository (25 mg total) rectally 2 (two) times daily. Patient not taking: Reported on 07/11/2024 06/04/24   Luiz Channel, MD  mupirocin ointment (BACTROBAN) 2 % Apply a small amount inside of both nostrils twice daily for 14 days. Patient not taking: Reported on 07/11/2024 02/28/24   [provider]  prednisoLONE acetate (PRED FORTE) 1 % ophthalmic suspension 1 drop 4 (four) times daily. Patient not taking: Reported on 06/04/2024 02/13/24   [provider]  PRESCRIPTION MEDICATION Inject 1 Dose into the skin once a week. Allergy  shot in office on Fridays    [provider]  rosuvastatin  (CRESTOR ) 40 MG tablet Take 1 tablet (40 mg total) by mouth daily. Patient not taking: Reported on 07/11/2024 12/27/23 12/26/24  Marylu Gee, DO  Spacer/Aero-Holding Chambers (AEROCHAMBER MV) inhaler as directed for 30 days    [provider]  traMADol  (ULTRAM ) 50 MG tablet TAKE 1 TABLET BY MOUTH EVERY 6 HOURS AS NEEDED. 05/19/24   Luiz Channel, MD  TYRVAYA  0.03 MG/ACT SOLN  02/25/24   [provider]    Family History Family History  Problem Relation Age of  Onset   Hypertension Mother    Hypertension Father    Hyperlipidemia Father    COPD Father    Atrial fibrillation Father    Colon cancer Neg Hx    Rectal cancer Neg Hx    Stomach cancer Neg Hx    Colon polyps Neg Hx    Esophageal cancer Neg Hx    BRCA 1/2 Neg Hx    Breast cancer Neg Hx     Social History Social History   Tobacco Use   Smoking status: Former    Current packs/day: 0.00    Average packs/day: 2.0 packs/day for 38.0 years (76.1 ttl pk-yrs)    Types: Cigarettes    Start date: 12/18/2012    Quit date: 11/21/2013    Years since quitting: 10.6    Passive exposure: Past   Smokeless tobacco: Never  Vaping Use   Vaping status: Never Used  Substance Use Topics   Alcohol use: Not Currently   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     Allergies   Varenicline    Review of Systems Review of Systems  Per HPI  Physical Exam Triage Vital Signs ED Triage Vitals  Encounter Vitals Group     BP 07/11/24 0917 (!) 138/92     Girls Systolic BP Percentile --      Girls Diastolic BP Percentile --      Boys Systolic BP Percentile --      Boys Diastolic BP Percentile --      Pulse Rate 07/11/24 0917 99     Resp 07/11/24 0917 18     Temp 07/11/24 0917 98.6 F (37 C)     Temp Source 07/11/24 0917 Oral     SpO2 07/11/24 0917 96 %     Weight --      Height --      Head Circumference --      Peak Flow --  Pain Score 07/11/24 0918 9     Pain Loc --      Pain Education --      Exclude from Growth Chart --    No data found.  Updated Vital Signs BP (!) 138/92 (BP Location: Left Arm)   Pulse 99   Temp 98.6 F (37 C) (Oral)   Resp 18   SpO2 96%   Visual Acuity Right Eye Distance:   Left Eye Distance:   Bilateral Distance:    Right Eye Near:   Left Eye Near:    Bilateral Near:     Physical Exam Vitals and nursing note reviewed. Exam conducted with a chaperone present.  Constitutional:      Appearance: Normal appearance.  HENT:     Head:  Normocephalic and atraumatic.     Right Ear: External ear normal.     Left Ear: External ear normal.     Nose: Nose normal.     Mouth/Throat:     Mouth: Mucous membranes are moist.     Comments: Diffuse white coating on tongue and inside of cheeks Eyes:     Conjunctiva/sclera: Conjunctivae normal.  Cardiovascular:     Rate and Rhythm: Normal rate.  Pulmonary:     Effort: Pulmonary effort is normal. No respiratory distress.  Genitourinary:    Rectum: Tenderness, external hemorrhoid and internal hemorrhoid present. No anal fissure.   Skin:    General: Skin is warm and dry.  Neurological:     General: No focal deficit present.     Mental Status: She is alert and oriented to person, place, and time.  Psychiatric:        Mood and Affect: Mood normal.        Behavior: Behavior normal. Behavior is cooperative.      UC Treatments / Results  Labs (all labs ordered are listed, but only abnormal results are displayed) Labs Reviewed - No data to display  EKG   Radiology No results found.  Procedures Procedures (including critical care time)  Medications Ordered in UC Medications  metoCLOPramide (REGLAN) injection 5 mg (5 mg Intramuscular Given 07/11/24 1016)    Initial Impression / Assessment and Plan / UC Course  I have reviewed the triage vital signs and the nursing notes.  Pertinent labs & imaging results that were available during my care of the patient were reviewed by me and considered in my medical decision making (see chart for details).  Vitals and triage reviewed, patient is hemodynamically stable.  She appears thin and frail.  Tolerating oral fluids, overall appetite and foul taste seem to be the issue.  Does have a white coating on her tongue, will treat with Diflucan orally due to nystatin  failure and immunocompromise status.  Chaperone present for rectal exam revealing thrombosed hemorrhoid.  Advised lidocaine  and hydrocortisone  kit and stool  softener.  Continues to have sinus pain and pressure.  Encouraged PCP/ENT follow-up for chronic sinusitis.  IM Reglan given in clinic for nausea and vomiting.  Is tolerating p.o. fluids today in clinic.  Strict emergency precautions given if symptoms evolve or worsen.  Patient verbalized understanding, no questions at this time.     Final Clinical Impressions(s) / UC Diagnoses   Final diagnoses:  Chronic frontal sinusitis  Nausea and vomiting, unspecified vomiting type  Hemorrhoids, unspecified hemorrhoid type     Discharge Instructions      We have given you Reglan to help with your nausea.  Over the next hour or so  please start sipping on water.  I believe some of your problem is from a oral thrush, take the Diflucan as prescribed.  Take 2 tablets today and then 1 tablet daily for the next 10 days to help treat your oral thrush.  You have a thrombosed hemorrhoid.  Apply the lidocaine  hydrocortisone  up to 2 times daily, do not use this for longer than 7 days in a row.  Ensure you are eating a diet high in fiber, you can consider starting a stool softener as well and ensure you are drinking at least 64 ounces of water daily.  Please follow-up with your primary care provider if your chronic sinus issues persist.  Seek immediate care if you are unable to hold down food or fluids at the nearest emergency department.     ED Prescriptions     Medication Sig Dispense Auth. Provider   fluconazole (DIFLUCAN) 200 MG tablet Take 2 tablets today and then 1 tablet daily for the next 10 days. 11 tablet Bonita Brindisi  N, FNP   Lidocaine -Hydrocortisone  Ace 1-3 % KIT Place 1 Application rectally 2 (two) times daily as needed. 14 kit Lesleyann Fichter  N, FNP   docusate sodium  (COLACE) 100 MG capsule Take 1 capsule (100 mg total) by mouth every 12 (twelve) hours. 60 capsule Dreama, Jairon Ripberger  N, FNP      PDMP not reviewed this encounter.   Dreama, Kahleah Crass  N, FNP 07/11/24 1020

## 2024-07-11 NOTE — Telephone Encounter (Signed)
 Lidocaine  and hydrocortisone  rectal kit was not covered per patient's insurance and was too expensive out-of-pocket.  Sent in hydrocortisone  rectal cream.

## 2024-07-11 NOTE — ED Triage Notes (Addendum)
 Pt reports emesis episodes that started last week, but have since resolved. She remains nauseous, but states she started eating and drinking normally 3 days ago (able to keep it down). Her issue now is dry heaving on mucus for the past few days. Notes she finished azithromycin  doses from sinusitis video visit.   Pt notes later in triage she is also concerned about hemorrhoids and burning pain to rectum.

## 2024-07-14 DIAGNOSIS — B20 Human immunodeficiency virus [HIV] disease: Secondary | ICD-10-CM | POA: Diagnosis not present

## 2024-07-15 DIAGNOSIS — B20 Human immunodeficiency virus [HIV] disease: Secondary | ICD-10-CM | POA: Diagnosis not present

## 2024-07-16 DIAGNOSIS — B20 Human immunodeficiency virus [HIV] disease: Secondary | ICD-10-CM | POA: Diagnosis not present

## 2024-07-17 DIAGNOSIS — B20 Human immunodeficiency virus [HIV] disease: Secondary | ICD-10-CM | POA: Diagnosis not present

## 2024-07-17 DIAGNOSIS — J329 Chronic sinusitis, unspecified: Secondary | ICD-10-CM | POA: Diagnosis not present

## 2024-07-17 DIAGNOSIS — H903 Sensorineural hearing loss, bilateral: Secondary | ICD-10-CM | POA: Diagnosis not present

## 2024-07-17 DIAGNOSIS — H9313 Tinnitus, bilateral: Secondary | ICD-10-CM | POA: Diagnosis not present

## 2024-07-17 NOTE — Progress Notes (Signed)
 Otolaryngology Clinic Note  HPI:   Chief Complaint  Patient presents with  . Tinnitus    Started X2wks    . Dizziness    Started Jacalyn  -seen PCP meds given  . Nasal Congestion    X2wks treated w/antibiotics    Janice Williams is a 63 y.o. female who presents as an established patient for 2-week history of mid-facial/sinus pressure, nausea, decreased appetite, dizziness and tinnitus. She felt well prior to two weeks ago. Seen at Urgent Care last week. Medical therapy has included Zpack and Cefdinir . She has lost eight pounds recently due to decreased appetite. Denies fever. She feels excessive postnasal drainage and has a bad taste in her throat. Denies mucopurulent rhinorrhea. Tinnitus is new and she feels that her hearing is decreased bilaterally. Current medications include Flonase , Astelin , Claritin  and Xyzal . She is also taking Zofran  for the nausea. She does not currently have an established primary care provider. Maxillofacial CT was negative on 02/04/2021.  Her last audiogram on 04/04/2021 demonstrated normal hearing with a mild high frequency SNHL and excellent word understanding. Normal tympanometry.   Surgeries include tonsillectomy.   Former smoker.   PMH/Meds/All/SocHx/FamHx/ROS:   Medical History[1]  Surgical History[2]  No family history of bleeding disorders, wound healing problems or difficulty with anesthesia.      Current Medications[3]  A complete ROS was performed with pertinent positives/negatives noted in the HPI. The remainder of the ROS are negative.    Physical Exam:    There were no vitals taken for this visit.   General Awake, at baseline alertness during examination.  Eyes No scleral icterus or conjunctival hemorrhage. Globe position appears normal. EOMI.   Right Ear EAC patent, TM intact w/o inflammation. Middle ear well aerated.   Left Ear EAC patent, TM intact w/o inflammation. Middle ear well aerated.   Nose Septum midline. Clear secretions.  Patent, no polyps or masses seen on anterior rhinoscopy.  Oral cavity Edentulous. No mucosal lesions or tumors seen. Tongue midline.   Oropharynx Tonsils absent.   Neck No abnormal cervical lymphadenopathy. No thyromegaly. No thyroid  masses palpated.  Cardio-vascular No cyanosis.  Pulmonary No audible stridor. Breathing easily with no labor.  Neuro Symmetric facial movement.   Psychiatry Appropriate affect and mood for clinic visit.   Independent Review of Additional Tests or Records:  External medical record - 07/11/2024 [facility]   Procedures:   Nasal Endoscopy  Technique: The procedure was discussed including risks and complications. Questions were answered.  Informed consent was obtained.     After anesthetizing the nasal cavity with topical lidocaine  and oxymetazoline, the flexible endoscope was introduced and passed through both nasal cavities into the nasopharynx. Clear secretions. No mucopurulence or polyps. Nasopharynx is normal.     She tolerated the procedure well.    Impression & Plans:  Janice Williams is a 63 y.o. female with 2-week history of sinus pressure, unrelieved by two rounds of antibiotics. No signs of infection on nasal endoscopy. I recommend maxillofacial CT imaging. We will schedule an audiogram at her earliest convenience. Tinnitus precautions advised. Recommend masking technique, avoidance of loud noise and use of ear protection in appropriate environments. Cautioned about use of over-the-counter tinnitus supplements as they are not FDA approved.   I recommend she become established with a primary care provider which she is in the process of doing. We will contact her with the test results. Follow up accordingly with ENT.  Patient agrees with the  plan.   Velia Pry, PA-C [facility]       [1] Past Medical History: Diagnosis Date  . Allergy    . COPD with acute bronchitis    (CMD)   . Dry eyes   . High  cholesterol   . Hypertension   [2] Past Surgical History: Procedure Laterality Date  . COLONOSCOPY     Procedure: COLONOSCOPY  . FACIAL COSMETIC SURGERY     Procedure: FACIAL COSMETIC SURGERY  . OTHER SURGICAL HISTORY     Procedure: OTHER SURGICAL HISTORY (Teeth Pulled); pt reports 8/22  [3]  Current Outpatient Medications:  .  albuterol  HFA (PROVENTIL  HFA;VENTOLIN  HFA;PROAIR  HFA) 90 mcg/actuation inhaler, Inhale., Disp: , Rfl:  .  amLODIPine  (NORVASC ) 10 mg tablet, TAKE ONE TABLET BY MOUTH DAILY, Disp: , Rfl:  .  bictegrav-emtricit-tenofov ala (Biktarvy ) 50-200-25 mg tab per tablet, Take  by mouth., Disp: , Rfl:  .  Biktarvy  50-200-25 mg tab per tablet, , Disp: , Rfl: 6 .  budesonide -formoteroL  (SYMBICORT ) 80-4.5 mcg/actuation inhaler, TAKE 2 PUFFS FIRST THING IN THE MORNING AND THEN ANOTHER 2 PUFFS 12 HOURS LATER, Disp: , Rfl:  .  EPINEPHrine  (EPIPEN ) 0.3 mg/0.3 mL injection syringe, AS DIRECTED INJECTION AS NEEDED FOR 30 DAYS, Disp: , Rfl:  .  fluocinolone acetonide  oiL 0.01 % drop, Instill 4 drops into affected ear(s) every night for 14 days, then stop and use as needed for itching., Disp: 20 mL, Rfl: 1 .  fluticasone  propionate (FLONASE ) 50 mcg/spray nasal spray, INSTILL 1 SPRAY INTO BOTH NOSTRILS DAILY, Disp: , Rfl:  .  hydrocortisone  (ANUSOL -HC) 2.5 % rectal cream, Apply rectally 2 times daily, Disp: , Rfl:  .  Miebo, PF, 100 % drop, , Disp: , Rfl:  .  acyclovir  (ZOVIRAX ) 400 mg tablet, Take 400 mg by mouth., Disp: , Rfl:  .  amoxicillin -pot clavulanate (AUGMENTIN ) 875-125 mg per tablet, , Disp: , Rfl: 0 .  azelastine  (ASTELIN ) 137 mcg (0.1 %) nasal spray, , Disp: , Rfl:  .  azelastine  (OPTIVAR ) 0.05 % drop ophthalmic solution, Administer 1 drop into affected eye(s)., Disp: , Rfl:  .  cefPODOXime  (VANTIN ) 200 mg tablet, Take 1 tablet by mouth in the morning and 1 tablet in the evening., Disp: , Rfl:  .  droNABinol  (MARINOL ) 5 mg capsule, Take 5 mg by mouth., Disp: , Rfl:  .   levocetirizine (XYZAL ) 5 mg tablet, Take 5 mg by mouth every evening., Disp: , Rfl:  .  loratadine  (CLARITIN ) 10 mg tablet, Take 10 mg by mouth., Disp: , Rfl:  .  megestroL  (MEGACE ) 40 mg tablet, Take 40 mg by mouth daily., Disp: , Rfl:  .  mirtazapine  (REMERON ) 15 mg tablet, Take 1 tablet by mouth nightly., Disp: , Rfl:  .  montelukast  (SINGULAIR ) 10 mg tablet, TAKE 1 TABLET(10 MG) BY MOUTH AT BEDTIME (Patient not taking: Reported on 07/17/2024), Disp: , Rfl:  .  mupirocin (BACTROBAN) 2 % ointment, Apply to nose 3 times daily (Patient not taking: Reported on 07/17/2024), Disp: 22 g, Rfl: 1 .  mupirocin (BACTROBAN) 2 % ointment, Apply a small amount inside of both nostrils twice daily for 14 days. (Patient not taking: Reported on 07/17/2024), Disp: 22 g, Rfl: 0 .  neomycin -polymyxin B-dexamethasone (POLYDEX) 3.5 mg/g-10,000 unit/g-0.1 % oint, APPLY ONE INCH OINTMENT INTO EACH LOWER EYE LID BEFORE BEDTIME (Patient not taking: Reported on 07/17/2024), Disp: , Rfl:  .  nutritional drink (Ensure) liqd liquid, Take 237 mL by mouth., Disp: , Rfl:  .  olopatadine  (PATADAY ) 0.2 % ophthalmic solution, Administer 1 drop into affected eye(s)., Disp: , Rfl:  .  omeprazole  (PriLOSEC) 20 mg DR capsule, TAKE 1 CAPSULE BY MOUTH EVERY DAY, Disp: , Rfl:  .  ondansetron  (ZOFRAN ) 4 mg tablet, Take 4 mg by mouth., Disp: , Rfl:  .  Pataday  0.2 % ophthalmic solution, , Disp: , Rfl: 2 .  polyethylene glycol (GLYCOLAX ) 17 gram packet, Take one capful in 8 oz of fluid of your choice twice a day until having soft stools, then back down to once a day.  If not having soft stools with twice a day, increased to three times a day, Disp: , Rfl:  .  pramoxine (PROCTOFOAM) 1 % foam, Place rectally., Disp: , Rfl:  .  pravastatin  (PRAVACHOL ) 20 mg tablet, TAKE 1 TABLET BY MOUTH EVERY DAY, Disp: , Rfl:  .  ProAir  HFA 90 mcg/actuation inhaler, INHALE 1 PUFF ITL Q 4 TO 6 H PRN, Disp: , Rfl: 2 .  Procto-Med HC  2.5 % rectal cream, U REC BID,  Disp: , Rfl: 0 .  Restasis 0.05 % ophthalmic emulsion, INSTILL 1 DROP INTO EACH EYE TWICE DAILY, Disp: , Rfl:  .  rosuvastatin  (CRESTOR ) 20 mg tablet, Take 20 mg by mouth daily., Disp: , Rfl:  .  sodium chloride  (Ayr Saline) 0.65 % drop nasal drops, Administer 2 sprays into affected nostril(s)., Disp: , Rfl:  .  Spiriva  Respimat 2.5 mcg/actuation mist inhaler, Inhale 2 puffs daily., Disp: , Rfl:  .  traMADoL  (ULTRAM ) 50 mg tablet, Take 50 mg by mouth., Disp: , Rfl:  .  traMADoL  (ULTRAM ) 50 mg tablet, TK 1 T PO  Q 12 H PRN P, Disp: , Rfl: 0 .  triamcinolone  (NASACORT  AQ) 55 mcg spra spray, Administer 2 sprays into affected nostril(s)., Disp: , Rfl:  .  Tyrvaya  0.03 mg/spray sprm, , Disp: , Rfl:  .  witch hazeL (Preparation H, witch hazel,) 50 % padm, Use as needed for rectal itching, after having BM, Disp: , Rfl:

## 2024-07-18 DIAGNOSIS — B20 Human immunodeficiency virus [HIV] disease: Secondary | ICD-10-CM | POA: Diagnosis not present

## 2024-07-21 DIAGNOSIS — B20 Human immunodeficiency virus [HIV] disease: Secondary | ICD-10-CM | POA: Diagnosis not present

## 2024-07-22 ENCOUNTER — Other Ambulatory Visit: Payer: Self-pay

## 2024-07-22 DIAGNOSIS — M2602 Maxillary hypoplasia: Secondary | ICD-10-CM | POA: Diagnosis not present

## 2024-07-22 DIAGNOSIS — H9313 Tinnitus, bilateral: Secondary | ICD-10-CM | POA: Diagnosis not present

## 2024-07-22 DIAGNOSIS — J329 Chronic sinusitis, unspecified: Secondary | ICD-10-CM | POA: Diagnosis not present

## 2024-07-22 DIAGNOSIS — B20 Human immunodeficiency virus [HIV] disease: Secondary | ICD-10-CM | POA: Diagnosis not present

## 2024-07-22 NOTE — Progress Notes (Signed)
 [facility] -   [address] [place], [place] [address] [phone]  Audiology Note  Patient Name: Janice Williams    Patient DOB: 09-08-1961             Patient Age: 63 y.o.       [name] was seen for a hearing evaluation and tympanograms following the kind referral of Velia Pry, PA-C.  The patient has concerns with bilateral tinnitus and otalgia. She endorsed known TMJ dysfunction and noted that her dentist assisted her with a recent procedure. Previous testing on 12/02/18 and 04/04/21.  Type A and Type As tympanograms were obtained for the right and left ear respectively.  Speech Reception Thresholds (SRTs) of 20 dB HL and 20 dB HLwere obtained for the right and left ear respectively.  Speech clarity testing revealed Word Recognition Scores (WRSs) of 100% and 96% for the right and left ear respectively with presentation levels of 60 dB HL and 60 dB HL.  The Speech Intelligibility Index, or SII, is a quantification of the proportion of speech information that is both audible and usable for a listener. It is a percentage (between 0% to 100%), and is correlated to the intelligibility of speech. For this individual, when listening to average conversational speech the predicted SII was: Right ear: 86% Left ear: 95%  The hearing evaluation revealed stable, grossly WNL hearing sensitivity in both ears from 250- 8000 Hz; thresholds ranging from 15- 30 dB HL. Results were obtained using inserts then headphones.  Reliability was regarded as good.   Please see the audiogram for further details.  Electronically signed by: Philippe Gowda Meroth, Au.D. 07/22/2024 10:44 AM Clinical Audiologist for [facility], Nose, and Throat

## 2024-07-22 NOTE — Telephone Encounter (Signed)
 Pt advised below and voices understanding  -pt is concerned with continued pain at the base of nose and would like to know what can be done for the discomfort

## 2024-07-22 NOTE — Telephone Encounter (Signed)
-----   Message from Velia Pry, [place] sent at 07/22/2024 11:15 AM EDT ----- Stephane, please contact patient. Preliminary review of the sinus CT scan is completely negative. Her audiogram is essentially normal with excellent word understanding. I recommend she follow up with her primary care provider for nausea and weight loss. Thank you.  Velia

## 2024-07-22 NOTE — Telephone Encounter (Signed)
 Pt advised below and voices understanding

## 2024-07-23 DIAGNOSIS — B20 Human immunodeficiency virus [HIV] disease: Secondary | ICD-10-CM | POA: Diagnosis not present

## 2024-07-24 ENCOUNTER — Other Ambulatory Visit: Payer: Self-pay

## 2024-07-24 DIAGNOSIS — B20 Human immunodeficiency virus [HIV] disease: Secondary | ICD-10-CM | POA: Diagnosis not present

## 2024-07-25 DIAGNOSIS — B20 Human immunodeficiency virus [HIV] disease: Secondary | ICD-10-CM | POA: Diagnosis not present

## 2024-07-29 ENCOUNTER — Inpatient Hospital Stay (HOSPITAL_COMMUNITY)

## 2024-07-29 ENCOUNTER — Inpatient Hospital Stay (HOSPITAL_COMMUNITY)
Admission: EM | Admit: 2024-07-29 | Discharge: 2024-08-01 | DRG: 392 | Disposition: A | Discharge status: Home / Self Care | Attending: Family Medicine | Admitting: Family Medicine

## 2024-07-29 ENCOUNTER — Telehealth: Payer: Self-pay

## 2024-07-29 ENCOUNTER — Other Ambulatory Visit: Payer: Self-pay

## 2024-07-29 ENCOUNTER — Emergency Department (HOSPITAL_COMMUNITY)

## 2024-07-29 ENCOUNTER — Encounter (HOSPITAL_COMMUNITY): Payer: Self-pay

## 2024-07-29 DIAGNOSIS — R45851 Suicidal ideations: Secondary | ICD-10-CM

## 2024-07-29 DIAGNOSIS — I671 Cerebral aneurysm, nonruptured: Secondary | ICD-10-CM | POA: Diagnosis present

## 2024-07-29 DIAGNOSIS — G43909 Migraine, unspecified, not intractable, without status migrainosus: Secondary | ICD-10-CM | POA: Diagnosis present

## 2024-07-29 DIAGNOSIS — E785 Hyperlipidemia, unspecified: Secondary | ICD-10-CM | POA: Diagnosis present

## 2024-07-29 DIAGNOSIS — H409 Unspecified glaucoma: Secondary | ICD-10-CM | POA: Diagnosis present

## 2024-07-29 DIAGNOSIS — I6523 Occlusion and stenosis of bilateral carotid arteries: Secondary | ICD-10-CM | POA: Diagnosis present

## 2024-07-29 DIAGNOSIS — Z8249 Family history of ischemic heart disease and other diseases of the circulatory system: Secondary | ICD-10-CM | POA: Diagnosis not present

## 2024-07-29 DIAGNOSIS — Z79899 Other long term (current) drug therapy: Secondary | ICD-10-CM

## 2024-07-29 DIAGNOSIS — R519 Headache, unspecified: Principal | ICD-10-CM

## 2024-07-29 DIAGNOSIS — Z681 Body mass index (BMI) 19 or less, adult: Secondary | ICD-10-CM | POA: Diagnosis not present

## 2024-07-29 DIAGNOSIS — F1290 Cannabis use, unspecified, uncomplicated: Secondary | ICD-10-CM | POA: Diagnosis present

## 2024-07-29 DIAGNOSIS — J329 Chronic sinusitis, unspecified: Secondary | ICD-10-CM | POA: Diagnosis present

## 2024-07-29 DIAGNOSIS — Z825 Family history of asthma and other chronic lower respiratory diseases: Secondary | ICD-10-CM | POA: Diagnosis not present

## 2024-07-29 DIAGNOSIS — R634 Abnormal weight loss: Secondary | ICD-10-CM | POA: Diagnosis present

## 2024-07-29 DIAGNOSIS — Z21 Asymptomatic human immunodeficiency virus [HIV] infection status: Secondary | ICD-10-CM | POA: Diagnosis present

## 2024-07-29 DIAGNOSIS — Z7951 Long term (current) use of inhaled steroids: Secondary | ICD-10-CM | POA: Diagnosis not present

## 2024-07-29 DIAGNOSIS — I10 Essential (primary) hypertension: Secondary | ICD-10-CM | POA: Diagnosis present

## 2024-07-29 DIAGNOSIS — I69341 Monoplegia of lower limb following cerebral infarction affecting right dominant side: Secondary | ICD-10-CM | POA: Diagnosis not present

## 2024-07-29 DIAGNOSIS — I6529 Occlusion and stenosis of unspecified carotid artery: Secondary | ICD-10-CM | POA: Diagnosis not present

## 2024-07-29 DIAGNOSIS — J439 Emphysema, unspecified: Secondary | ICD-10-CM | POA: Diagnosis present

## 2024-07-29 DIAGNOSIS — Z87891 Personal history of nicotine dependence: Secondary | ICD-10-CM | POA: Diagnosis not present

## 2024-07-29 DIAGNOSIS — R112 Nausea with vomiting, unspecified: Principal | ICD-10-CM | POA: Diagnosis present

## 2024-07-29 DIAGNOSIS — Z83438 Family history of other disorder of lipoprotein metabolism and other lipidemia: Secondary | ICD-10-CM

## 2024-07-29 DIAGNOSIS — R1110 Vomiting, unspecified: Secondary | ICD-10-CM | POA: Diagnosis not present

## 2024-07-29 DIAGNOSIS — Z888 Allergy status to other drugs, medicaments and biological substances status: Secondary | ICD-10-CM

## 2024-07-29 DIAGNOSIS — K219 Gastro-esophageal reflux disease without esophagitis: Secondary | ICD-10-CM | POA: Diagnosis present

## 2024-07-29 DIAGNOSIS — F419 Anxiety disorder, unspecified: Secondary | ICD-10-CM | POA: Diagnosis present

## 2024-07-29 DIAGNOSIS — H93A9 Pulsatile tinnitus, unspecified ear: Secondary | ICD-10-CM

## 2024-07-29 DIAGNOSIS — R42 Dizziness and giddiness: Secondary | ICD-10-CM | POA: Diagnosis not present

## 2024-07-29 LAB — COMPREHENSIVE METABOLIC PANEL WITH GFR
ALT: 18 U/L (ref 0–44)
AST: 25 U/L (ref 15–41)
Albumin: 4.2 g/dL (ref 3.5–5.0)
Alkaline Phosphatase: 55 U/L (ref 38–126)
Anion gap: 10 (ref 5–15)
BUN: 12 mg/dL (ref 8–23)
CO2: 29 mmol/L (ref 22–32)
Calcium: 9.9 mg/dL (ref 8.9–10.3)
Chloride: 99 mmol/L (ref 98–111)
Creatinine, Ser: 0.81 mg/dL (ref 0.44–1.00)
GFR, Estimated: >60 mL/min (ref >60)
Glucose, Bld: 94 mg/dL (ref 70–99)
Potassium: 3.7 mmol/L (ref 3.5–5.1)
Sodium: 138 mmol/L (ref 135–145)
Total Bilirubin: 0.6 mg/dL (ref 0.0–1.2)
Total Protein: 8.6 g/dL — ABNORMAL HIGH (ref 6.5–8.1)

## 2024-07-29 LAB — LIPASE, BLOOD: Lipase: 38 U/L (ref 11–51)

## 2024-07-29 LAB — URINALYSIS, ROUTINE W REFLEX MICROSCOPIC
Bacteria, UA: NONE SEEN
Bilirubin Urine: NEGATIVE
Glucose, UA: NEGATIVE mg/dL
Hgb urine dipstick: NEGATIVE
Ketones, ur: NEGATIVE mg/dL
Nitrite: NEGATIVE
Protein, ur: NEGATIVE mg/dL
Specific Gravity, Urine: 1.005 (ref 1.005–1.030)
pH: 7 (ref 5.0–8.0)

## 2024-07-29 LAB — CBC
HCT: 39.6 % (ref 36.0–46.0)
Hemoglobin: 12.4 g/dL (ref 12.0–15.0)
MCH: 31.6 pg (ref 26.0–34.0)
MCHC: 31.3 g/dL (ref 30.0–36.0)
MCV: 100.8 fL — ABNORMAL HIGH (ref 80.0–100.0)
Platelets: 269 K/uL (ref 150–400)
RBC: 3.93 MIL/uL (ref 3.87–5.11)
RDW: 14.6 % (ref 11.5–15.5)
WBC: 4.9 K/uL (ref 4.0–10.5)
nRBC: 0 % (ref 0.0–0.2)

## 2024-07-29 LAB — GLUCOSE, CSF: Glucose, CSF: 52 mg/dL (ref 40–70)

## 2024-07-29 LAB — PROTEIN, CSF: Total  Protein, CSF: 26 mg/dL (ref 15–45)

## 2024-07-29 MED ORDER — ONDANSETRON HCL 4 MG/2ML IJ SOLN
4.0000 mg | Freq: Four times a day (QID) | INTRAMUSCULAR | Status: DC | PRN
  Administered 2024-07-30 (×4): 4 mg via INTRAVENOUS
  Filled 2024-07-29 (×3): qty 2

## 2024-07-29 MED ORDER — SENNOSIDES-DOCUSATE SODIUM 8.6-50 MG PO TABS: 1.0000 | ORAL_TABLET | Freq: Every evening | ORAL | Status: DC | PRN

## 2024-07-29 MED ORDER — ACETAMINOPHEN 650 MG RE SUPP: 650.0000 mg | Freq: Four times a day (QID) | RECTAL | Status: DC | PRN

## 2024-07-29 MED ORDER — TUBERCULIN PPD 5 UNIT/0.1ML ID SOLN
5.0000 [IU] | INTRADERMAL | Status: AC
  Administered 2024-07-29 (×2): 5 [IU] via INTRADERMAL
  Filled 2024-07-29: qty 0.1

## 2024-07-29 MED ORDER — SODIUM CHLORIDE 0.9 % IV SOLN: INTRAVENOUS | Status: AC

## 2024-07-29 MED ORDER — BISACODYL 5 MG PO TBEC
5.0000 mg | DELAYED_RELEASE_TABLET | Freq: Every day | ORAL | Status: DC | PRN
  Filled 2024-07-29: qty 1

## 2024-07-29 MED ORDER — ONDANSETRON HCL 4 MG PO TABS
4.0000 mg | ORAL_TABLET | Freq: Four times a day (QID) | ORAL | Status: DC | PRN
Start: 2024-07-29 — End: 2024-08-01
  Administered 2024-07-31: 4 mg via ORAL
  Filled 2024-07-29: qty 1

## 2024-07-29 MED ORDER — ENOXAPARIN SODIUM 30 MG/0.3ML IJ SOSY
30.0000 mg | PREFILLED_SYRINGE | INTRAMUSCULAR | Status: DC
  Administered 2024-07-31 – 2024-08-01 (×2): 30 mg via SUBCUTANEOUS
  Filled 2024-07-29 (×4): qty 0.3

## 2024-07-29 MED ORDER — IOHEXOL 350 MG/ML SOLN
75.0000 mL | Freq: Once | INTRAVENOUS | Status: AC | PRN
  Administered 2024-07-29 (×2): 75 mL via INTRAVENOUS

## 2024-07-29 MED ORDER — ACETAMINOPHEN 325 MG PO TABS
650.0000 mg | ORAL_TABLET | Freq: Four times a day (QID) | ORAL | Status: DC | PRN
  Administered 2024-07-30 – 2024-07-31 (×5): 650 mg via ORAL
  Filled 2024-07-29 (×6): qty 2

## 2024-07-29 MED ORDER — DIPHENHYDRAMINE HCL 50 MG/ML IJ SOLN
12.5000 mg | Freq: Once | INTRAMUSCULAR | Status: AC
  Administered 2024-07-29 (×2): 12.5 mg via INTRAVENOUS
  Filled 2024-07-29: qty 1

## 2024-07-29 MED ORDER — FENTANYL CITRATE PF 50 MCG/ML IJ SOSY
50.0000 ug | PREFILLED_SYRINGE | Freq: Once | INTRAMUSCULAR | Status: AC
  Administered 2024-07-29 (×2): 50 ug via INTRAVENOUS
  Filled 2024-07-29: qty 1

## 2024-07-29 MED ORDER — DIAZEPAM 5 MG PO TABS
5.0000 mg | ORAL_TABLET | Freq: Once | ORAL | Status: AC
  Administered 2024-07-29 (×2): 5 mg via ORAL
  Filled 2024-07-29: qty 1

## 2024-07-29 MED ORDER — METOCLOPRAMIDE HCL 5 MG/ML IJ SOLN
5.0000 mg | Freq: Once | INTRAMUSCULAR | Status: AC
  Administered 2024-07-29 (×2): 5 mg via INTRAVENOUS
  Filled 2024-07-29: qty 2

## 2024-07-29 MED ORDER — BISACODYL 10 MG RE SUPP: 10.0000 mg | Freq: Once | RECTAL | Status: DC

## 2024-07-29 NOTE — Telephone Encounter (Signed)
 Patient left voicemail stating she is still throwing up. She called about 1 month ago with concerns of nausea and vomiting. She went to urgent care for this on 7/25.  Is also requesting a referral to someone that can evaluate and treat her for TMJ. Message sent to Dr. Luiz.  Izyk Marty, BSN, RN

## 2024-07-29 NOTE — ED Provider Notes (Signed)
 [place] EMERGENCY DEPARTMENT AT [facility] Provider Note   CSN: [id number] Arrival date & time: 07/29/24  1126     Patient presents with: Emesis   Janice Williams is a 63 y.o. female with h/o CVA with residual right leg weakness, HIV with undetectable numbers presents emerged from today for evaluation of multiple complaints.  Patient reports the past month that she has been waking up with some dizziness but does not experience it at any other times.  Has been having nausea and vomiting even without the dizziness emesis throughout the day.  Reports that she is unable to eat and drink anything.  She reports the smell of food makes her nauseous.  She reports very rare lower abdominal pain maybe 1-2 episodes but has not had anything recently.  Denies any diarrhea but reports some constipation.  Still reports she is passing a lot of gas.  Denies any dysuria, hematuria, vaginal bleeding, vaginal discharge, fever, chest pain, shortness of breath.  She denies any cough.  Reports that she is having some runny nose, nasal gesturing, and sinus pressure for the past month as well.  Was recently seen in ENT and had a CT of her maxillofacial and was told that she had some chronic sinusitis.  She is also having some tinnitus and was thought that this was from her TMJ.  She reports that she is having suicidal ideations because of the amount of pain she is having.  She also ports some diffuse headaches but no visual changes, again for the past month.  She has a previous tobacco user but is quit over 10 years ago.  Allergic to Chantix .  Takes Biktarvy .  Only surgery reported bilateral tubal ligation.   Emesis Associated symptoms: headaches   Associated symptoms: no abdominal pain, no chills, no cough, no diarrhea and no fever        Prior to Admission medications   Medication Sig Start Date End Date Taking? Authorizing Provider  albuterol  (VENTOLIN  HFA) 108 (90 Base) MCG/ACT inhaler Inhale 2 puffs  into the lungs every 6 (six) hours as needed for wheezing or shortness of breath. 02/28/23  Yes Rudy Sieving, MD  amLODipine  (NORVASC ) 10 MG tablet Take 1 tablet (10 mg total) by mouth daily. 03/19/24 03/19/25 Yes Marylu Gee, DO  bictegravir-emtricitabine -tenofovir  AF (BIKTARVY ) 50-200-25 MG TABS tablet Take 1 tablet by mouth daily. 02/27/24  Yes Luiz Channel, MD  Ensure (ENSURE) Take 237 mLs by mouth 3 (three) times daily between meals. 12/20/23  Yes Luiz Channel, MD  EPIPEN  2-PAK 0.3 MG/0.3ML SOAJ injection  03/21/22  Yes [provider]  famotidine  (PEPCID ) 20 MG tablet Take 1 tablet (20 mg total) by mouth 2 (two) times daily. 07/01/24  Yes Gladis Elsie BROCKS, PA-C  fluticasone  (FLONASE ) 50 MCG/ACT nasal spray Use 1 spray(s) in each nostril once daily 06/04/24  Yes Luiz Channel, MD  levocetirizine (XYZAL ) 5 MG tablet Take 1 tablet (5 mg total) by mouth every evening. 12/26/23  Yes Luiz Channel, MD  Lidocaine -Hydrocortisone  Ace 1-3 % KIT Place 1 Application rectally 2 (two) times daily as needed. 07/11/24  Yes Dreama, [place]  N, FNP  MIEBO 1.338 GM/ML SOLN  02/25/24  Yes [provider]  mirtazapine  (REMERON ) 15 MG tablet Take 1 tablet (15 mg total) by mouth at bedtime. 06/04/24  Yes Luiz Channel, MD  omeprazole  (PRILOSEC) 20 MG capsule Take 1 capsule (20 mg total) by mouth daily. 02/13/24  Yes Jolaine Pac, DO  ondansetron  (ZOFRAN -ODT) 4 MG disintegrating  tablet Take 1 tablet (4 mg total) by mouth every 8 (eight) hours as needed for nausea or vomiting. 07/01/24  Yes Gladis Elsie BROCKS, PA-C  RESTASIS 0.05 % ophthalmic emulsion 1 drop 2 (two) times daily. 08/05/21  Yes [provider]  rosuvastatin  (CRESTOR ) 40 MG tablet Take 1 tablet (40 mg total) by mouth daily. 12/27/23 12/26/24 Yes Marylu Gee, DO  SYMBICORT  160-4.5 MCG/ACT inhaler Inhale 2 puffs into the lungs 2 (two) times daily. 01/13/24  Yes [provider]  Tiotropium Bromide  Monohydrate (SPIRIVA   RESPIMAT) 2.5 MCG/ACT AERS Inhale 2 puffs into the lungs daily. 02/12/24  Yes Jolaine Pac, DO  traMADol  (ULTRAM ) 50 MG tablet TAKE 1 TABLET BY MOUTH EVERY 6 HOURS AS NEEDED. 05/19/24  Yes Luiz Channel, MD  cefdinir  (OMNICEF ) 300 MG capsule Take 1 capsule (300 mg total) by mouth 2 (two) times daily. Patient not taking: Reported on 07/29/2024 07/01/24   Gladis Elsie BROCKS, PA-C  docusate sodium  (COLACE) 100 MG capsule Take 1 capsule (100 mg total) by mouth every 12 (twelve) hours. Patient not taking: Reported on 07/29/2024 07/11/24   Dreama, [place]  N, FNP  fluconazole  (DIFLUCAN ) 200 MG tablet Take 2 tablets today and then 1 tablet daily for the next 10 days. Patient not taking: Reported on 07/29/2024 07/11/24   Dreama Dexter SAILOR, FNP  hydrocortisone  (ANUSOL -HC) 2.5 % rectal cream Place 1 Application rectally 2 (two) times daily. Patient not taking: Reported on 07/29/2024 07/11/24   Dreama, [place]  N, FNP  hydrocortisone  (ANUSOL -HC) 25 MG suppository Place 1 suppository (25 mg total) rectally 2 (two) times daily. Patient not taking: Reported on 07/29/2024 06/04/24   Luiz Channel, MD  mupirocin ointment [name]) 2 %  02/28/24   [provider]  nystatin  (MYCOSTATIN ) 100000 UNIT/ML suspension Take 5 mLs (500,000 Units total) by mouth 4 (four) times daily. Patient not taking: Reported on 07/29/2024 07/07/24   Burnette, Jennifer M, PA-C  Olopatadine  HCl 0.2 % SOLN 1 drop into affected eye Ophthalmic Once a day Patient not taking: Reported on 07/29/2024    [provider]  prednisoLONE acetate (PRED FORTE) 1 % ophthalmic suspension 1 drop 4 (four) times daily. 02/13/24   [provider]  PRESCRIPTION MEDICATION Inject 1 Dose into the skin once a week. Allergy  shot in office on Fridays    [provider]  Spacer/Aero-Holding Chambers (AEROCHAMBER MV) inhaler as directed for 30 days    [provider]  TYRVAYA  0.03 MG/ACT SOLN  02/25/24   [provider]    Allergies: Varenicline     Review of Systems  Constitutional:  Negative for chills and fever.  HENT:  Positive for congestion, rhinorrhea and sinus pressure.   Eyes:  Negative for photophobia and visual disturbance.  Respiratory:  Negative for cough and shortness of breath.   Cardiovascular:  Negative for chest pain.  Gastrointestinal:  Positive for constipation, nausea and vomiting. Negative for abdominal pain and diarrhea.  Genitourinary:  Negative for dysuria, hematuria, vaginal bleeding and vaginal discharge.  Neurological:  Positive for dizziness and headaches. Negative for syncope, speech difficulty, weakness and numbness.  Psychiatric/Behavioral:  Positive for suicidal ideas.     Updated Vital Signs BP (!) 144/83 (BP Location: Left Arm)   Pulse (!) 106   Temp 99 F (37.2 C) (Oral)   Resp 16   Ht 5' 3 (1.6 m)   Wt 39 kg   SpO2 97%   BMI 15.23 kg/m   Physical Exam Vitals and nursing note reviewed.  Constitutional:  General: She is not in acute distress.    Appearance: She is not toxic-appearing.  HENT:     Right Ear: Tympanic membrane, ear canal and external ear normal.     Left Ear: Tympanic membrane, ear canal and external ear normal.     Mouth/Throat:     Mouth: Mucous membranes are moist.  Eyes:     General: No scleral icterus.    Extraocular Movements: Extraocular movements intact.     Right eye: No nystagmus.     Left eye: No nystagmus.     Pupils: Pupils are equal, round, and reactive to light.  Cardiovascular:     Rate and Rhythm: Normal rate.  Pulmonary:     Effort: Pulmonary effort is normal. No respiratory distress.  Abdominal:     Palpations: Abdomen is soft.     Tenderness: There is no abdominal tenderness. There is no guarding or rebound.  Musculoskeletal:     Cervical back: Normal range of motion. No rigidity or tenderness.     Right lower leg: No edema.     Left lower leg: No edema.  Lymphadenopathy:     Cervical: No cervical  adenopathy.  Skin:    General: Skin is warm and dry.  Neurological:     General: No focal deficit present.     Mental Status: She is alert and oriented to person, place, and time.     GCS: GCS eye subscore is 4. GCS verbal subscore is 5. GCS motor subscore is 6.     Cranial Nerves: No cranial nerve deficit, dysarthria or facial asymmetry.     Sensory: Sensory deficit (at baseline, no acute) present.     Motor: Weakness (at baseline, no acute) present.     Comments: Some weakness and tingling to the right lower extremity which is chronic. Otherwise, strength and sensation intake. Cranial nerves intact.   Psychiatric:        Mood and Affect: Affect is tearful.        Thought Content: Thought content includes suicidal ideation. Thought content does not include homicidal ideation. Thought content does not include homicidal or suicidal plan.     (all labs ordered are listed, but only abnormal results are displayed) Labs Reviewed  COMPREHENSIVE METABOLIC PANEL WITH GFR - Abnormal; Notable for the following components:      Result Value   Total Protein 8.6 (*)    All other components within normal limits  CBC - Abnormal; Notable for the following components:   MCV 100.8 (*)    All other components within normal limits  URINALYSIS, ROUTINE W REFLEX MICROSCOPIC - Abnormal; Notable for the following components:   Color, Urine STRAW (*)    Leukocytes,Ua MODERATE (*)    All other components within normal limits  CSF CULTURE W GRAM STAIN  URINE CULTURE  MTB-RIF NAA W AFB CULT, NON-SPUTUM  HSV CULTURE AND TYPING  ACID FAST SMEAR (AFB, MYCOBACTERIA)  ACID FAST CULTURE WITH REFLEXED SENSITIVITIES (MYCOBACTERIA)  LIPASE, BLOOD  GLUCOSE, CSF  PROTEIN, CSF  QUANTIFERON-TB GOLD PLUS  CSF CELL COUNT WITH DIFFERENTIAL  CSF CELL COUNT WITH DIFFERENTIAL  CRYPTOCOCCAL ANTIGEN, CSF  MENINGITIS/ENCEPHALITIS PANEL (CSF)  BASIC METABOLIC PANEL WITH GFR  CBC  MAGNESIUM   PHOSPHORUS     EKG: None  Radiology: DG Abd 1 View Result Date: 07/29/2024 CLINICAL DATA:  Vomiting for 1 month EXAM: ABDOMEN - 1 VIEW COMPARISON:  None Available. FINDINGS: Scattered large and small bowel gas is noted. Contrast is noted  within the renal collecting systems and bladder related to recent CT. No bony abnormality is noted. No free air is seen. IMPRESSION: No acute abnormality noted. Electronically Signed   By: Oneil Devonshire M.D.   On: 07/29/2024 23:27   CT ANGIO HEAD NECK W WO CM Result Date: 07/29/2024 CLINICAL DATA:  Vertigo, nausea, vomiting, and photosensitivity. EXAM: CT ANGIOGRAPHY HEAD AND NECK WITH AND WITHOUT CONTRAST TECHNIQUE: Multidetector CT imaging of the head and neck was performed using the standard protocol during bolus administration of intravenous contrast. Multiplanar CT image reconstructions and MIPs were obtained to evaluate the vascular anatomy. Carotid stenosis measurements (when applicable) are obtained utilizing NASCET criteria, using the distal internal carotid diameter as the denominator. RADIATION DOSE REDUCTION: This exam was performed according to the departmental dose-optimization program which includes automated exposure control, adjustment of the mA and/or kV according to patient size and/or use of iterative reconstruction technique. CONTRAST:  75mL OMNIPAQUE  IOHEXOL  350 MG/ML SOLN COMPARISON:  CT sinuses 07/22/2024 FINDINGS: CT HEAD FINDINGS Brain: There is no evidence of an acute infarct, intracranial hemorrhage, mass, midline shift, or extra-axial fluid collection. There is a chronic insult in the anterior right basal ganglia region with ex vacuo dilatation of the frontal horn of the right lateral ventricle. The ventricles are otherwise normal in size. Vascular: Calcified atherosclerosis at the skull base. No hyperdense vessel. Skull: No fracture or suspicious lesion. Sinuses/Orbits: Paranasal sinuses and mastoid air cells are well aerated. Bilateral cataract  extraction. Other: None. Review of the MIP images confirms the above findings CTA NECK FINDINGS Aortic arch: Normal variant aortic arch branching pattern with common origin of the brachiocephalic and left common carotid arteries. Mild atherosclerosis without a significant stenosis of the arch vessel origins. Right carotid system: Patent with a small to moderate amount of calcified and soft plaque in the carotid bulb. No evidence of a significant stenosis or dissection. Left carotid system: Patent with a small amount of calcified and soft plaque in the carotid bulb. No evidence of a significant stenosis or dissection. Vertebral arteries: Patent with the left being moderately dominant. No evidence of a significant stenosis or dissection. Skeleton: Edentulous. Moderate cervical spondylosis. Prominent facet arthrosis at C7-T1 with grade 1 anterolisthesis. Other neck: No evidence of cervical lymphadenopathy or mass. Upper chest: Moderate to severe centrilobular emphysema. Review of the MIP images confirms the above findings CTA HEAD FINDINGS Anterior circulation: The internal carotid arteries are patent from skull base to carotid termini with moderate atherosclerotic irregularity bilaterally. There are mild bilateral cavernous ICA stenoses. 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 ACAs and MCAs are patent without evidence of a proximal branch occlusion or significant proximal stenosis. Posterior circulation: The intracranial vertebral arteries are patent to the basilar with atherosclerotic irregularity bilaterally and a mild-to-moderate stenosis of the proximal right V4 segment. Patent PICA, AICA, and SCA origins are visualized bilaterally. The basilar artery is patent and mildly irregular diffusely without a significant focal stenosis.  There are small posterior communicating arteries bilaterally. Both PCAs are patent with mild diffuse irregularity but no flow limiting proximal stenosis. No aneurysm is identified. Venous sinuses: Patent. Anatomic variants: None. Review of the MIP images confirms the above findings IMPRESSION: 1. No evidence of acute intracranial abnormality. 2. Moderate atherosclerosis in the head and neck without a large vessel occlusion.  3. Mild bilateral cavernous ICA stenoses. 4. 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 5. Cervical carotid atherosclerosis without significant stenosis. 6. Aortic Atherosclerosis (ICD10-I70.0) and Emphysema (ICD10-J43.9). Electronically Signed   By: Dasie Hamburg M.D.   On: 07/29/2024 16:35    Lumbar Puncture  Date/Time: 07/29/2024 8:30 PM  Performed by: Bernis Ernst, PA-C Authorized by: Bernis Ernst, PA-C   Consent:    Consent obtained:  Verbal   Consent given by:  Patient   Risks, benefits, and alternatives were discussed: yes     Risks discussed:  Headache, infection and pain   Alternatives discussed:  No treatment Universal protocol:    Procedure explained and questions answered to patient or proxy's satisfaction: yes     Imaging studies available: yes     Patient identity confirmed:  Verbally with patient Pre-procedure details:    Procedure purpose:  Diagnostic   Preparation: Patient was prepped and draped in usual sterile fashion   Sedation:    Sedation type: valium . Anesthesia:    Anesthesia method:  Local infiltration   Local anesthetic:  Lidocaine  1% w/o epi Procedure details:    Lumbar space:  L3-L4 interspace   Patient position:  L lateral decubitus   Needle gauge:  20   Needle type:  Spinal needle - Quincke tip   Needle length (in):  3.5   Ultrasound guidance: no     Number of attempts:  2   Opening pressure (cm H2O):  11   Fluid appearance:  Clear   Tubes of fluid:  4   Total volume (ml):  16 Post-procedure details:    Puncture site:   Adhesive bandage applied and direct pressure applied   Procedure completion:  Tolerated well, no immediate complications    Medications Ordered in the ED  tuberculin injection 5 Units (5 Units Intradermal Given 07/29/24 2129)  acetaminophen  (TYLENOL ) tablet 650 mg (has no administration in time range)    Or  acetaminophen  (TYLENOL ) suppository 650 mg (has no administration in time range)  senna-docusate (Senokot-S) tablet 1 tablet (has no administration in time range)  bisacodyl  (DULCOLAX) EC tablet 5 mg (has no administration in time range)  ondansetron  (ZOFRAN ) tablet 4 mg (has no administration in time range)    Or  ondansetron  (ZOFRAN ) injection 4 mg (has no administration in time range)  enoxaparin  (LOVENOX ) injection 30 mg (has no administration in time range)  0.9 %  sodium chloride  infusion (has no administration in time range)  bisacodyl  (DULCOLAX) suppository 10 mg (has no administration in time range)  fentaNYL  (SUBLIMAZE ) injection 50 mcg (50 mcg Intravenous Given 07/29/24 1533)  iohexol  (OMNIPAQUE ) 350 MG/ML injection 75 mL (75 mLs Intravenous Contrast Given 07/29/24 1546)  metoCLOPramide  (REGLAN ) injection 5 mg (5 mg Intravenous Given 07/29/24 1724)  diphenhydrAMINE  (BENADRYL ) injection 12.5 mg (12.5 mg Intravenous Given 07/29/24 1722)  diazepam  (VALIUM ) tablet 5 mg (5 mg Oral Given 07/29/24 1942)                                 Medical Decision Making Amount and/or Complexity of Data Reviewed Labs: ordered. Radiology: ordered.  Risk Prescription drug management. Decision regarding hospitalization.   63 y.o. female presents to the ER for evaluation of multiple complaints. Differential diagnosis includes but is not limited to electrolyte abnormality, vertigo, intracranial abnormality, CVA. Vital signs mildly elevated blood pressure otherwise unremarkable. Physical exam as noted above.   On previous chart  evaluation, patient was seen by ENT on 7/31-25.  Had a CT scan of  the facial bones without contrast for chronic sinusitis.  This was unrelieved by 2 rounds of antibiotics.  There was no signs of infection on nasal endoscopy per chart review.  They will also schedule an audiogram.  Her abdomen is soft and nontender to palpation.  She is only having the nausea.  Will order CT a of the head and neck for patient's ear pain, tinnitus and headache to see if this is causing some of her symptoms. Will need to medically clear before TTS evaluation.   I independently reviewed and interpreted the patient's labs.  CBC without leukocytosis or anemia.  CMP shows mildly elevated total protein 8.6, otherwise no other electrolyte or LFT abnormality.  Lipase within normal limits.  Urinalysis shows straw-colored urine with moderate mount leukocytes with 6-10 white blood cells.  No bacteria or nitrites.  Will culture.  CTA Head and Neck shows  1. No evidence of acute intracranial abnormality. 2. Moderate atherosclerosis in the head and neck without a large vessel occlusion. 3. Mild bilateral cavernous ICA stenoses. 4. 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 5. Cervical carotid atherosclerosis without significant stenosis. 6. Aortic Atherosclerosis (ICD10-I70.0) and Emphysema (ICD10-J43.9).  Consulted neurology given findings, spoke with Dr. Lindzen. Recommends doing LP for opening pressure as well as multiple different CSF testing given patient's HIV. Dr. Merrianne has reviewed the CSF labs ordered and agrees with orders. Also asking for ppd and quantererfion gold. Additionally, he was recommending MRI before LP, however it is not available at this time. Recommends continuing with LP and then MRI will need to be 24 hours later. Does not recommend any antibiotics or other treatment at this time.   Spoke with neurosurgery, Lauraine Pickle PA-C for the ICA aneurysms. She can follow up outpatient for these.   Please see procedure note for LP. Patient and family are aware that she needs to  remain lying flat for one hour. CSF still pending.   Given her SI, the patient will need TTS consultation during admission. Will have her admitted to continue workup and imaging. She will need to be transferred over to [facility]. [facility] to admit.    I discussed this case with my attending physician who cosigned this note including patient's presenting symptoms, physical exam, and planned diagnostics and interventions. Attending physician stated agreement with plan or made changes to plan which were implemented.   Attending physician assessed patient at bedside.  Portions of this report may have been transcribed using voice recognition software. Every effort was made to ensure accuracy; however, inadvertent computerized transcription errors may be present.    Final diagnoses:  Intractable headache, unspecified chronicity pattern, unspecified headache type  Nausea and vomiting, unspecified vomiting type  Aneurysm of internal carotid artery  Pulsatile tinnitus  Suicidal ideations    ED Discharge Orders     None          Bernis Ernst, PA-C 07/29/24 2348    Mannie Pac T, DO 07/30/24 [phone]

## 2024-07-29 NOTE — ED Triage Notes (Signed)
 Patient said she has been vomiting for 1 month. Unable to keep fluids down. Lost 8 pounds over the month. Her doctor said due to TMJ jaw pain it is causing her to vomit nonstop. Feels like she has a heart beat in both of her ears. Her ears keep ringing.

## 2024-07-29 NOTE — Telephone Encounter (Signed)
 Called [facility] to confirm if her niece was able to take her to the ED. Eila says she's currently at [facility].   Morgen Ritacco, BSN, RN

## 2024-07-29 NOTE — Telephone Encounter (Signed)
 Per Dr. Luiz, can send in Reglan  if that was helpful for [name].   Called Janice Williams, she says that the Reglan  did not help. Reports that she can't sleep or eat and that she is throwing up straight air. States she's lost 8 pounds in 2 weeks and is down to 85 pounds.   Reports her face and ears hurt. Has concerns about TMJ. Says she called Dr. Sheryle (DMD), but that he can't see her until September or November.   She is tearful and reports that all of this is worsening her depression and that she has been having thoughts of harming herself. Reports she is currently having these thoughts.   Discussed that due to intractable nausea with weight loss and active thoughts of self-harm that she should go to the emergency department. She does not have transportation. Offered to call EMS to have them transport her. She will call her niece to see if she can take her.   Contracted for safety and asked her to please call back if her niece is not able to take her to the ED. Kiki verbalized agreement.   Dr. Luiz notified.   Rhone Ozaki, BSN, RN

## 2024-07-29 NOTE — H&P (Addendum)
 History and Physical  [name] FMW:[id number] DOB: Nov 20, 1961 DOA: 07/29/2024  PCP: Patient, No Pcp Per   Chief Complaint: Nausea, vomiting and headache  HPI: Janice Williams is a 63 y.o. female with medical history significant for CVA with residual right leg weakness, HIV on Biktarvy , COPD, chronic frontal sinusitis, HTN and anxiety who presented to the ED for evaluation of persistent nausea and vomiting.  Patient reports she has had intermittent nausea and vomiting over the last month.  She has had 2 visits to the urgent care and treated with multiple antibiotics and antiemetics without significant relief.  She has been unable to keep much food or water down resulting in unintentional weight loss over the last few weeks. She endorses associated tinnitus and dizziness described as room spinning.  She also endorsed left-sided headache and mild neck pain. Reports she had a flare of her chronic sinusitis and had a follow-up with ENT 2 weeks ago.  Reports her nausea and vomiting has been intermittent in nature but has not resulted in dry heaving.  This morning, she started dry heaving and due to the significant discomfort, she had a brief thought about suicide but reports she would never kill myself and this brief thought was due to her ongoing discomfort.  She currently denies any SI.  She endorses mild constipation and heartburn but denies any vision changes, chest pain, shortness of breath, diarrhea, abdominal pain, fevers or chills.  ED Course: Initial vitals show temp 97.9, RR 19, HR 104, BP 143/97, SpO2 99% on room air. Initial labs significant for normal CBC, CMP, UA with negative nitrite, moderate leuks, WBC 6/10 and no bacteria.  CTA head and neck shows mild bilateral ICA aneurysm and stenosis but no acute intracranial abnormality and no LVO. Pt received Valium  5 mg, IV Benadryl  12.5 mg, IV fentanyl  50 mcg, IV Reglan  5 mg and 5 units of tuberculin intradermal injection.  Neurology and  neurosurgery were consulted for evaluation.  LP was performed at the bedside by EDP.  TRH was consulted for admission.   Review of Systems: Please see HPI for pertinent positives and negatives. A complete 10 system review of systems are otherwise negative.  Past Medical History:  Diagnosis Date   Anemia    Arthritis    generalized   Blood transfusion without reported diagnosis 2003   Bronchitis    Cataract    COPD (chronic obstructive pulmonary disease) (HCC)    COPD with acute exacerbation (HCC) 06/09/2017   Dental abscess 03/15/2015   DYSPEPSIA 02/14/2007   Qualifier: Diagnosis of  By: Manford Longs     EXTERNAL OTITIS 01/06/2010   Qualifier: Diagnosis of  By: Janna Williams, Burnard     FACIAL RASH 02/04/2009   Qualifier: Diagnosis of  By: Cleotilde Pencil     Former smoker    quit 2014   GERD (gastroesophageal reflux disease)    Glaucoma    HIV infection (HCC)    Hypertension    Osteoporosis    Seasonal allergies    Stroke (HCC) 2003   Substance abuse (HCC)    history, clean 7 years   SVD (spontaneous vaginal delivery)    x 3   Past Surgical History:  Procedure Laterality Date   COLONOSCOPY  2019   KN-MAC-suprep(adeq)-tics/hems/TA x 5   MULTIPLE TOOTH EXTRACTIONS     with sedation   POLYPECTOMY  01/2008   TA x 5   TONSILLECTOMY  1973   TUBAL LIGATION     UPPER  GI ENDOSCOPY     normal per patient - years ago   Social History:  reports that she quit smoking about 10 years ago. Her smoking use included cigarettes. She started smoking about 11 years ago. She has a 76.1 pack-year smoking history. She has been exposed to tobacco smoke. She has never used smokeless tobacco. She reports that she does not currently use alcohol. She reports that she does not use drugs.  Allergies  Allergen Reactions   Varenicline  Itching and Other (See Comments)    Family History  Problem Relation Age of Onset   Hypertension Mother    Hypertension Father    Hyperlipidemia Father     COPD Father    Atrial fibrillation Father    Colon cancer Neg Hx    Rectal cancer Neg Hx    Stomach cancer Neg Hx    Colon polyps Neg Hx    Esophageal cancer Neg Hx    BRCA 1/2 Neg Hx    Breast cancer Neg Hx      Prior to Admission medications   Medication Sig Start Date End Date Taking? Authorizing Provider  albuterol  (VENTOLIN  HFA) 108 (90 Base) MCG/ACT inhaler Inhale 2 puffs into the lungs every 6 (six) hours as needed for wheezing or shortness of breath. 02/28/23   Janice Williams  amLODipine  (NORVASC ) 10 MG tablet Take 1 tablet (10 mg total) by mouth daily. 03/19/24 03/19/25  Janice Williams  bictegravir-emtricitabine -tenofovir  AF (BIKTARVY ) 50-200-25 MG TABS tablet Take 1 tablet by mouth daily. 02/27/24   Janice Williams  cefdinir  (OMNICEF ) 300 MG capsule Take 1 capsule (300 mg total) by mouth 2 (two) times daily. 07/01/24   Janice Williams  docusate sodium  (COLACE) 100 MG capsule Take 1 capsule (100 mg total) by mouth every 12 (twelve) hours. 07/11/24   Janice Williams  N, Williams  Ensure (ENSURE) Take 237 mLs by mouth 3 (three) times daily between meals. 12/20/23   Janice Williams  EPIPEN  2-PAK 0.3 MG/0.3ML SOAJ injection  03/21/22   Provider, Historical, Williams  famotidine  (PEPCID ) 20 MG tablet Take 1 tablet (20 mg total) by mouth 2 (two) times daily. 07/01/24   Janice Williams  fluconazole  (DIFLUCAN ) 200 MG tablet Take 2 tablets today and then 1 tablet daily for the next 10 days. 07/11/24   Janice Williams  fluticasone  (FLONASE ) 50 MCG/ACT nasal spray Use 1 spray(s) in each nostril once daily 06/04/24   Janice Williams  hydrocortisone  (ANUSOL -HC) 2.5 % rectal cream Place 1 Application rectally 2 (two) times daily. 07/11/24   Janice Williams  hydrocortisone  (ANUSOL -HC) 25 MG suppository Place 1 suppository (25 mg total) rectally 2 (two) times daily. Patient not taking: Reported on 07/11/2024 06/04/24   Janice Williams  levocetirizine (XYZAL ) 5 MG  tablet Take 1 tablet (5 mg total) by mouth every evening. 12/26/23   Janice Williams  Lidocaine -Hydrocortisone  Ace 1-3 % KIT Place 1 Application rectally 2 (two) times daily as needed. 07/11/24   Janice Williams  N, Williams  MIEBO 1.338 GM/ML SOLN  02/25/24   Provider, Historical, Williams  mirtazapine  (REMERON ) 15 MG tablet Take 1 tablet (15 mg total) by mouth at bedtime. 06/04/24   Janice Williams  mupirocin ointment (BACTROBAN) 2 % Apply a small amount inside of both nostrils twice daily for 14 days. Patient not taking: Reported on 07/11/2024 02/28/24   Provider, Historical, Williams  nystatin  (MYCOSTATIN ) 100000 UNIT/ML suspension Take 5 mLs (500,000 Units total)  by mouth 4 (four) times daily. 07/07/24   Vivienne Delon HERO, Williams  Olopatadine  HCl 0.2 % SOLN 1 drop into affected eye Ophthalmic Once a day    Provider, Historical, Williams  omeprazole  (PRILOSEC) 20 MG capsule Take 1 capsule (20 mg total) by mouth daily. 02/13/24   Jolaine Pac, Williams  ondansetron  (ZOFRAN -ODT) 4 MG disintegrating tablet Take 1 tablet (4 mg total) by mouth every 8 (eight) hours as needed for nausea or vomiting. 07/01/24   Janice Williams  prednisoLONE acetate (PRED FORTE) 1 % ophthalmic suspension 1 drop 4 (four) times daily. Patient not taking: Reported on 06/04/2024 02/13/24   Provider, Historical, Williams  PRESCRIPTION MEDICATION Inject 1 Dose into the skin once a week. Allergy  shot in office on Fridays    Provider, Historical, Williams  RESTASIS 0.05 % ophthalmic emulsion 1 drop 2 (two) times daily. 08/05/21   Provider, Historical, Williams  rosuvastatin  (CRESTOR ) 40 MG tablet Take 1 tablet (40 mg total) by mouth daily. Patient not taking: Reported on 07/11/2024 12/27/23 12/26/24  Janice Williams  Spacer/Aero-Holding Chambers (AEROCHAMBER MV) inhaler as directed for 30 days    Provider, Historical, Williams  SYMBICORT  160-4.5 MCG/ACT inhaler Inhale 2 puffs into the lungs 2 (two) times daily. 01/13/24   Provider, Historical, Williams  Tiotropium Bromide   Monohydrate (SPIRIVA  RESPIMAT) 2.5 MCG/ACT AERS Inhale 2 puffs into the lungs daily. 02/12/24   Jolaine Pac, Williams  traMADol  (ULTRAM ) 50 MG tablet TAKE 1 TABLET BY MOUTH EVERY 6 HOURS AS NEEDED. 05/19/24   Janice Williams  TYRVAYA  0.03 MG/ACT SOLN  02/25/24   Provider, Historical, Williams    Physical Exam: BP (!) 144/83 (BP Location: Left Arm)   Pulse (!) 106   Temp 99 F (37.2 C) (Oral)   Resp 16   Ht 5' 3 (1.6 m)   Wt 39 kg   SpO2 97%   BMI 15.23 kg/m  General: Pleasant, thin-appearing elderly woman laying in bed. No acute distress. HEENT: [place]/AT. Anicteric sclera. Dry mucous membrane. CV: RRR. No murmurs, rubs, or gallops. No LE edema Pulmonary: Lungs CTAB. Normal effort. No wheezing or rales. Abdominal: Soft, nontender, nondistended. Normal bowel sounds. MSK: Mild neck and back tenderness after LP Skin: Warm and dry. No obvious rash or lesions. Neuro: A&Ox3. Moves all extremities. Chronic right leg weakness. Normal sensation to light touch. Psych: Normal mood and affect          Labs on Admission:  Basic Metabolic Panel: Recent Labs  Lab 07/29/24 1148  NA 138  K 3.7  CL 99  CO2 29  GLUCOSE 94  BUN 12  CREATININE 0.81  CALCIUM  9.9   Liver Function Tests: Recent Labs  Lab 07/29/24 1148  AST 25  ALT 18  ALKPHOS 55  BILITOT 0.6  PROT 8.6*  ALBUMIN 4.2   Recent Labs  Lab 07/29/24 1148  LIPASE 38   No results for input(s): AMMONIA in the last 168 hours. CBC: Recent Labs  Lab 07/29/24 1148  WBC 4.9  HGB 12.4  HCT 39.6  MCV 100.8*  PLT 269   Cardiac Enzymes: No results for input(s): CKTOTAL, CKMB, CKMBINDEX, TROPONINI in the last 168 hours. BNP (last 3 results) No results for input(s): BNP in the last 8760 hours.  ProBNP (last 3 results) No results for input(s): PROBNP in the last 8760 hours.  CBG: No results for input(s): GLUCAP in the last 168 hours.  Radiological Exams on Admission: DG Abd 1 View Result Date:  07/29/2024  CLINICAL DATA:  Vomiting for 1 month EXAM: ABDOMEN - 1 VIEW COMPARISON:  None Available. FINDINGS: Scattered large and small bowel gas is noted. Contrast is noted within the renal collecting systems and bladder related to recent CT. No bony abnormality is noted. No free air is seen. IMPRESSION: No acute abnormality noted. Electronically Signed   By: Oneil Devonshire M.D.   On: 07/29/2024 23:27   CT ANGIO HEAD NECK W WO CM Result Date: 07/29/2024 CLINICAL DATA:  Vertigo, nausea, vomiting, and photosensitivity. EXAM: CT ANGIOGRAPHY HEAD AND NECK WITH AND WITHOUT CONTRAST TECHNIQUE: Multidetector CT imaging of the head and neck was performed using the standard protocol during bolus administration of intravenous contrast. Multiplanar CT image reconstructions and MIPs were obtained to evaluate the vascular anatomy. Carotid stenosis measurements (when applicable) are obtained utilizing NASCET criteria, using the distal internal carotid diameter as the denominator. RADIATION DOSE REDUCTION: This exam was performed according to the departmental dose-optimization program which includes automated exposure control, adjustment of the mA and/or kV according to patient size and/or use of iterative reconstruction technique. CONTRAST:  75mL OMNIPAQUE  IOHEXOL  350 MG/ML SOLN COMPARISON:  CT sinuses 07/22/2024 FINDINGS: CT HEAD FINDINGS Brain: There is no evidence of an acute infarct, intracranial hemorrhage, mass, midline shift, or extra-axial fluid collection. There is a chronic insult in the anterior right basal ganglia region with ex vacuo dilatation of the frontal horn of the right lateral ventricle. The ventricles are otherwise normal in size. Vascular: Calcified atherosclerosis at the skull base. No hyperdense vessel. Skull: No fracture or suspicious lesion. Sinuses/Orbits: Paranasal sinuses and mastoid air cells are well aerated. Bilateral cataract extraction. Other: None. Review of the MIP images confirms the above  findings CTA NECK FINDINGS Aortic arch: Normal variant aortic arch branching pattern with common origin of the brachiocephalic and left common carotid arteries. Mild atherosclerosis without a significant stenosis of the arch vessel origins. Right carotid system: Patent with a small to moderate amount of calcified and soft plaque in the carotid bulb. No evidence of a significant stenosis or dissection. Left carotid system: Patent with a small amount of calcified and soft plaque in the carotid bulb. No evidence of a significant stenosis or dissection. Vertebral arteries: Patent with the left being moderately dominant. No evidence of a significant stenosis or dissection. Skeleton: Edentulous. Moderate cervical spondylosis. Prominent facet arthrosis at C7-T1 with grade 1 anterolisthesis. Other neck: No evidence of cervical lymphadenopathy or mass. Upper chest: Moderate to severe centrilobular emphysema. Review of the MIP images confirms the above findings CTA HEAD FINDINGS Anterior circulation: The internal carotid arteries are patent from skull base to carotid termini with moderate atherosclerotic irregularity bilaterally. There are mild bilateral cavernous ICA stenoses. 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 ACAs and MCAs are patent without evidence of a proximal branch occlusion or significant proximal stenosis. Posterior circulation: The intracranial vertebral arteries are patent to the basilar with atherosclerotic irregularity bilaterally and a mild-to-moderate stenosis of the proximal right V4 segment. Patent PICA, AICA, and SCA origins are visualized bilaterally. The basilar artery is patent and mildly irregular diffusely without a significant focal stenosis. There are small posterior communicating arteries bilaterally. Both PCAs  are patent with mild diffuse irregularity but no flow limiting proximal stenosis. No aneurysm is identified. Venous sinuses: Patent. Anatomic variants: None. Review of  the MIP images confirms the above findings IMPRESSION: 1. No evidence of acute intracranial abnormality. 2. Moderate atherosclerosis in the head and neck without a large vessel occlusion. 3. Mild bilateral cavernous ICA stenoses. 4. 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 5. Cervical carotid atherosclerosis without significant stenosis. 6. Aortic Atherosclerosis (ICD10-I70.0) and Emphysema (ICD10-J43.9). Electronically Signed   By: Dasie Hamburg M.D.   On: 07/29/2024 16:35   Assessment/Plan Janice Williams is a 63 y.o. 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  # Intractable nausea and vomiting # Headache - Patient with intermittent nausea and vomiting over the last month - Denies any abdominal pain, KUB does not show any stool burden - The cause of patient's nausea and vomiting remains unclear - Neurology consulted for evaluation - Due to her immunocompromise state, recommended LP and CSF studies - [facility] - Follow-up further neuro recs - Start IV NS at 100 cc/h - Check mag and Phos - Antiemetic with PRN Zofran  - Tylenol  as needed for headache  # ICA stenosis - CT a head and neck shows mild bilateral cavernous ICA stenosis and 6 mm and 3 mm left cavernous ICA aneurysms - Neurosurgery consulted, recommends outpatient follow-up  # HTN - BP stable with SBP in the 110s to 140s - Continue amlodipine   # COPD - Chronic and stable, no evidence of exacerbation - Continue home bronchodilators - As needed DuoNebs  # Chronic sinusitis # Hx of TMJ - Evaluated by ENT on 7/31  with unremarkable imaging - As needed Tylenol  for headache - Follow-up with ENT and dentist in the outpatient  # HIV - Last HIV studies in January showed undetected viral load and CD4 count of 1,125 - Check HIV RNA quant and CD4 count - Continue Biktarvy   # HLD - Continue rosuvastatin   # Allergies - Continue levocetirizine  # GERD - Continue PPI and famotidine   DVT prophylaxis: Lovenox      Code Status: Full Code  Consults called: Neurology, neurosurgery  Family Communication: Discussed admission with granddaughter at bedside  Severity of Illness: The appropriate patient status for this patient is INPATIENT. Inpatient status is judged to be reasonable and necessary in order to provide the required intensity of service to ensure the patient's safety. The patient's presenting symptoms, physical exam findings, and initial radiographic and laboratory data in the context of their chronic comorbidities is felt to place them at high risk for further clinical deterioration. Furthermore, it is not anticipated that the patient will be medically stable for discharge from the hospital within 2 midnights of admission.   * I certify that at the point of admission it is my clinical judgment that the patient will require inpatient hospital care spanning beyond 2 midnights from the point of admission due to high intensity of service, high risk for further deterioration and high frequency of surveillance required.*  Level of care: Med-Surg   This record has been created using Conservation officer, historic buildings. Errors have been sought and corrected, but may not always be located. Such creation errors Williams not reflect on the standard of care.   Lou Claretta HERO, Williams 07/30/2024, 1:10 AM [facility] Pager: [phone] Isaiah 41:10   If 7PM-7AM, please contact night-coverage [email or web address] Password [email or web address]

## 2024-07-30 ENCOUNTER — Inpatient Hospital Stay (HOSPITAL_COMMUNITY)

## 2024-07-30 DIAGNOSIS — R112 Nausea with vomiting, unspecified: Secondary | ICD-10-CM | POA: Diagnosis not present

## 2024-07-30 DIAGNOSIS — J439 Emphysema, unspecified: Secondary | ICD-10-CM

## 2024-07-30 DIAGNOSIS — I6529 Occlusion and stenosis of unspecified carotid artery: Secondary | ICD-10-CM

## 2024-07-30 DIAGNOSIS — F1290 Cannabis use, unspecified, uncomplicated: Secondary | ICD-10-CM

## 2024-07-30 DIAGNOSIS — R42 Dizziness and giddiness: Secondary | ICD-10-CM

## 2024-07-30 DIAGNOSIS — I671 Cerebral aneurysm, nonruptured: Secondary | ICD-10-CM

## 2024-07-30 DIAGNOSIS — R519 Headache, unspecified: Secondary | ICD-10-CM

## 2024-07-30 LAB — BASIC METABOLIC PANEL WITH GFR
Anion gap: 14 (ref 5–15)
BUN: 9 mg/dL (ref 8–23)
CO2: 24 mmol/L (ref 22–32)
Calcium: 9.7 mg/dL (ref 8.9–10.3)
Chloride: 101 mmol/L (ref 98–111)
Creatinine, Ser: 0.96 mg/dL (ref 0.44–1.00)
GFR, Estimated: >60 mL/min (ref >60)
Glucose, Bld: 73 mg/dL (ref 70–99)
Potassium: 3.6 mmol/L (ref 3.5–5.1)
Sodium: 139 mmol/L (ref 135–145)

## 2024-07-30 LAB — CBC
HCT: 36.9 % (ref 36.0–46.0)
Hemoglobin: 12.3 g/dL (ref 12.0–15.0)
MCH: 32.6 pg (ref 26.0–34.0)
MCHC: 33.3 g/dL (ref 30.0–36.0)
MCV: 97.9 fL (ref 80.0–100.0)
Platelets: 236 K/uL (ref 150–400)
RBC: 3.77 MIL/uL — ABNORMAL LOW (ref 3.87–5.11)
RDW: 14.6 % (ref 11.5–15.5)
WBC: 4.6 K/uL (ref 4.0–10.5)
nRBC: 0 % (ref 0.0–0.2)

## 2024-07-30 LAB — MENINGITIS/ENCEPHALITIS PANEL (CSF)

## 2024-07-30 LAB — RAPID URINE DRUG SCREEN, HOSP PERFORMED
Amphetamines: NOT DETECTED
Barbiturates: NOT DETECTED
Benzodiazepines: POSITIVE — AB
Cocaine: NOT DETECTED
Opiates: NOT DETECTED
Tetrahydrocannabinol: POSITIVE — AB

## 2024-07-30 LAB — MAGNESIUM: Magnesium: 1.7 mg/dL (ref 1.7–2.4)

## 2024-07-30 LAB — CSF CELL COUNT WITH DIFFERENTIAL
RBC Count, CSF: 1 /mm3 — ABNORMAL HIGH
RBC Count, CSF: 4 /mm3 — ABNORMAL HIGH
Tube #: 1
Tube #: 4
WBC, CSF: 2 /mm3 (ref 0–5)
WBC, CSF: 3 /mm3 (ref 0–5)

## 2024-07-30 LAB — CRYPTOCOCCAL ANTIGEN, CSF: Crypto Ag: NEGATIVE

## 2024-07-30 LAB — PHOSPHORUS: Phosphorus: 2.9 mg/dL (ref 2.5–4.6)

## 2024-07-30 MED ORDER — BICTEGRAVIR-EMTRICITAB-TENOFOV 50-200-25 MG PO TABS
1.0000 | ORAL_TABLET | Freq: Every day | ORAL | Status: DC
  Administered 2024-07-30 – 2024-08-01 (×4): 1 via ORAL
  Filled 2024-07-30 (×3): qty 1

## 2024-07-30 MED ORDER — SENNOSIDES-DOCUSATE SODIUM 8.6-50 MG PO TABS: 1.0000 | ORAL_TABLET | ORAL | Status: DC | PRN

## 2024-07-30 MED ORDER — FLUTICASONE FUROATE-VILANTEROL 200-25 MCG/ACT IN AEPB
1.0000 | INHALATION_SPRAY | Freq: Every day | RESPIRATORY_TRACT | Status: DC
  Filled 2024-07-30: qty 28

## 2024-07-30 MED ORDER — ENSURE ENLIVE PO LIQD
237.0000 mL | Freq: Three times a day (TID) | ORAL | Status: DC
  Administered 2024-07-30 – 2024-08-01 (×4): 237 mL via ORAL
  Filled 2024-07-30 (×9): qty 237

## 2024-07-30 MED ORDER — SENNOSIDES-DOCUSATE SODIUM 8.6-50 MG PO TABS
1.0000 | ORAL_TABLET | Freq: Two times a day (BID) | ORAL | Status: DC
  Administered 2024-07-30 – 2024-08-01 (×5): 1 via ORAL
  Filled 2024-07-30 (×5): qty 1

## 2024-07-30 MED ORDER — PANTOPRAZOLE SODIUM 40 MG IV SOLR
40.0000 mg | Freq: Once | INTRAVENOUS | Status: AC
  Administered 2024-07-30 (×2): 40 mg via INTRAVENOUS
  Filled 2024-07-30: qty 10

## 2024-07-30 MED ORDER — IPRATROPIUM-ALBUTEROL 0.5-2.5 (3) MG/3ML IN SOLN: 3.0000 mL | Freq: Four times a day (QID) | RESPIRATORY_TRACT | Status: DC | PRN

## 2024-07-30 MED ORDER — UMECLIDINIUM BROMIDE 62.5 MCG/ACT IN AEPB
1.0000 | INHALATION_SPRAY | Freq: Every day | RESPIRATORY_TRACT | Status: DC
  Filled 2024-07-30: qty 7

## 2024-07-30 MED ORDER — TIOTROPIUM BROMIDE MONOHYDRATE 2.5 MCG/ACT IN AERS: 2.0000 | INHALATION_SPRAY | Freq: Every day | RESPIRATORY_TRACT | Status: DC

## 2024-07-30 MED ORDER — GADOBUTROL 1 MMOL/ML IV SOLN
4.0000 mL | Freq: Once | INTRAVENOUS | Status: AC | PRN
  Administered 2024-07-30 (×2): 4 mL via INTRAVENOUS

## 2024-07-30 MED ORDER — LORATADINE 10 MG PO TABS
10.0000 mg | ORAL_TABLET | Freq: Every day | ORAL | Status: DC
  Administered 2024-07-30 – 2024-08-01 (×4): 10 mg via ORAL
  Filled 2024-07-30 (×4): qty 1

## 2024-07-30 MED ORDER — ENSURE PLUS HIGH PROTEIN PO LIQD
237.0000 mL | Freq: Two times a day (BID) | ORAL | Status: DC
  Administered 2024-07-31 (×2): 237 mL via ORAL

## 2024-07-30 MED ORDER — LEVOCETIRIZINE DIHYDROCHLORIDE 5 MG PO TABS: 5.0000 mg | ORAL_TABLET | Freq: Every evening | ORAL | Status: DC

## 2024-07-30 MED ORDER — PANTOPRAZOLE SODIUM 40 MG PO TBEC
40.0000 mg | DELAYED_RELEASE_TABLET | Freq: Every day | ORAL | Status: DC
  Administered 2024-07-31 – 2024-08-01 (×2): 40 mg via ORAL
  Filled 2024-07-30 (×4): qty 1

## 2024-07-30 MED ORDER — ROSUVASTATIN CALCIUM 20 MG PO TABS
40.0000 mg | ORAL_TABLET | Freq: Every day | ORAL | Status: DC
Start: 2024-07-30 — End: 2024-08-01
  Administered 2024-07-30 – 2024-08-01 (×4): 40 mg via ORAL
  Filled 2024-07-30 (×3): qty 2

## 2024-07-30 MED ORDER — FAMOTIDINE 20 MG PO TABS
20.0000 mg | ORAL_TABLET | Freq: Two times a day (BID) | ORAL | Status: DC
  Administered 2024-07-30 – 2024-08-01 (×7): 20 mg via ORAL
  Filled 2024-07-30 (×6): qty 1

## 2024-07-30 MED ORDER — AMLODIPINE BESYLATE 5 MG PO TABS
10.0000 mg | ORAL_TABLET | Freq: Every day | ORAL | Status: DC
  Administered 2024-07-30 – 2024-08-01 (×4): 10 mg via ORAL
  Filled 2024-07-30 (×4): qty 2

## 2024-07-30 NOTE — Plan of Care (Signed)
  Problem: Health Behavior/Discharge Planning: Goal: Ability to manage health-related needs will improve Outcome: Progressing   Problem: Clinical Measurements: Goal: Will remain free from infection Outcome: Progressing   Problem: Clinical Measurements: Goal: Diagnostic test results will improve Outcome: Progressing   Problem: Nutrition: Goal: Adequate nutrition will be maintained Outcome: Progressing   Problem: Pain Managment: Goal: General experience of comfort will improve and/or be controlled Outcome: Progressing   Problem: Safety: Goal: Ability to remain free from injury will improve Outcome: Progressing   Problem: Skin Integrity: Goal: Risk for impaired skin integrity will decrease Outcome: Progressing

## 2024-07-30 NOTE — Progress Notes (Signed)
 Received pt from [facility] Via stretcher, Alert and oriented, felt nauseated and has head ache, prn meds given. Oriented to room and surroundings, placed call bell within reach,

## 2024-07-30 NOTE — Progress Notes (Signed)
 PROGRESS NOTE    TENNELLE TAFLINGER  FMW:[id number] DOB: [birth year] DOA: 07/29/2024 PCP: Patient, No Pcp Per   Brief Narrative:  This 63 y.o. female with PMH significant for CVA with residual right leg weakness, HIV on Biktarvy , COPD, chronic frontal sinusitis, HTN and anxiety who presented to the ED for evaluation of persistent nausea and vomiting for last one month patient has went to urgent care twice and was treated with multiple antibiotics and antiemetics without any significant relief.  She is unable to tolerate anything orally.  Resulting in significant weight loss over the last few weeks.  Patient also report left-sided headache associated with neck pain and tinnitus. Workup in the ED CTA head and neck shows mild bilateral ICA aneurysm and stenosis but no acute intracranial abnormality or no LVO.  Neurology and neurosurgery was consulted.  LP ruled out acute meningitis.  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 The cause of patient's nausea and vomiting remains unclear. Neurology consulted for intractable headache. Due to her immunocompromised  state, recommended LP and CSF studies Lumbar puncture and CSF analysis ruled out meningitis. Neurology recommended MRI and MRV. Continue antiemetics for nausea and vomiting.  ICA stenosis: CT head and neck shows mild bilateral cavernous ICA stenosis and 6 mm and 3 mm left cavernous ICA aneurysms Neurosurgery consulted, recommends outpatient follow-up   Essential HTN: BP stable with SBP in the 110s to 140s Continue amlodipine .   COPD: Chronic and stable, no evidence of exacerbation. Continue home bronchodilators As needed  DuoNebs   Chronic sinusitis: Hx of TMJ Evaluated by ENT on 7/31 with unremarkable imaging As needed Tylenol  for headache Follow-up with ENT and dentist in the outpatient   HIV: Last HIV studies in January showed undetected viral load and CD4 count of 1,125 Check HIV RNA quant and CD4 count Continue Biktarvy    HLD Continue rosuvastatin .   Allergies Continue levocetirizine.   GERD Continue PPI and famotidine .     DVT prophylaxis: Lovenox  Code Status: Full code Family Communication: No family at bedside. Disposition Plan: Admitted for rule out CVA.   Consultants:  Neurology Neurosurgery  Procedures:  Antimicrobials:  Anti-infectives (From admission, onward)    Start     Dose/Rate Route Frequency Ordered Stop   07/30/24 1000  bictegravir-emtricitabine -tenofovir  AF (BIKTARVY ) 50-200-25 MG per tablet 1 tablet        1 tablet Oral Daily 07/30/24 0111         Subjective: Seen and examined at bedside.  Overnight events noted. She reports nausea and vomiting is improving.  He still has mild headache which is also improving.  Objective: Vitals:   07/30/24 0215 07/30/24 0347 07/30/24 0743 07/30/24 1218  BP: 124/85 (!) 138/91 (!) 122/96 (!) 136/92  Pulse: (!) 103 (!) 113 (!) 106 (!) 111  Resp: 15 18 19 19   Temp:  98.2 F (36.8 C) (!) 97.5 F (36.4 C) 98 F (36.7 C)  TempSrc:  Oral Oral Oral  SpO2: 94% 98% 96% 95%  Weight:      Height:        Intake/Output Summary (Last 24 hours) at 07/30/2024 1423 Last data filed at 07/30/2024 1205 Gross per 24 hour  Intake 240 ml  Output 100 ml  Net 140 ml   Filed Weights   07/29/24 1139  Weight: 39 kg    Examination:  General exam: Appears calm and comfortable, not in any acute distress. Respiratory system: Clear to auscultation. Respiratory effort normal.  RR 16 Cardiovascular system: S1 & S2 heard, RRR. No JVD, murmurs, rubs, gallops or clicks.  Gastrointestinal system: Abdomen is nondistended, soft and nontender.  Normal bowel sounds heard. Central nervous system: Alert and oriented x 3. No focal neurological deficits. Extremities: No edema, no cyanosis, no clubbing. Skin: No rashes, lesions or ulcers Psychiatry: Judgement and insight appear normal. Mood & affect appropriate.     Data Reviewed: I have personally reviewed following labs and imaging studies  CBC: Recent Labs  Lab 07/29/24 1148 07/30/24 0639  WBC 4.9 4.6  HGB 12.4 12.3  HCT 39.6 36.9  MCV 100.8* 97.9  PLT 269 236   Basic Metabolic Panel: Recent Labs  Lab 07/29/24 1148 07/30/24 0639  NA 138 139  K 3.7 3.6  CL 99 101  CO2 29 24  GLUCOSE 94 73  BUN 12 9  CREATININE 0.81 0.96  CALCIUM  9.9 9.7  MG  --  1.7  PHOS  --  2.9   GFR: Estimated Creatinine Clearance: 36.9 mL/min (by C-G formula based on SCr of 0.96 mg/dL). Liver Function Tests: Recent Labs  Lab 07/29/24 1148  AST 25  ALT 18  ALKPHOS 55  BILITOT 0.6  PROT 8.6*  ALBUMIN 4.2   Recent Labs  Lab 07/29/24 1148  LIPASE 38   No results for input(s): AMMONIA in the last 168 hours. Coagulation Profile: No results for input(s): INR, PROTIME in the last 168 hours. Cardiac Enzymes: No results for input(s): CKTOTAL, CKMB, CKMBINDEX, TROPONINI in the last 168 hours. BNP (last 3 results) No results for input(s): PROBNP in the last 8760 hours. HbA1C: No results for input(s): HGBA1C in the last 72 hours. CBG: No results for input(s): GLUCAP in the last 168 hours. Lipid Profile: No results for input(s): CHOL, HDL, LDLCALC, TRIG, CHOLHDL, LDLDIRECT in the last 72 hours. Thyroid  Function Tests: No results for input(s): TSH, T4TOTAL, FREET4, T3FREE, THYROIDAB in the last 72 hours. Anemia Panel: No results for input(s): VITAMINB12, FOLATE, FERRITIN, TIBC, IRON, RETICCTPCT in the last 72 hours. Sepsis Labs: No results for input(s): PROCALCITON, LATICACIDVEN in the last 168 hours.  Recent Results (from  the past 240 hours)  CSF culture w Gram Stain     Status: None (Preliminary result)   Collection Time: 07/29/24  9:48 PM   Specimen: CSF; Cerebrospinal Fluid  Result Value Ref Range Status   Specimen Description CSF  Final   Special Requests Immunocompromised  Final   Gram Stain   Final    NO ORGANISMS SEEN OCCASIONAL LYMPHS SEEN CYTOSPUN SMEAR Performed at [facility], [address], [place], [place] [address]    Culture PENDING  Incomplete   Report Status PENDING  Incomplete    Radiology Studies: MR Venogram Head Result Date: 07/30/2024 CLINICAL DATA:  Dizziness EXAM: MR VENOGRAM HEAD WITHOUT AND WITH CONTRAST TECHNIQUE: Angiographic images of the intracranial venous structures were acquired using MRV technique with intravenous contrast. CONTRAST:  4 mL Gadavist  administered intravenously COMPARISON:  None Available. FINDINGS: The superior sagittal sinus, transverse and sigmoid sinuses are normal. The straight sinus and deep veins are normal. IMPRESSION: Normal Electronically Signed   By: Nancyann Burns M.D.   On: 07/30/2024 11:15   MR BRAIN W WO CONTRAST  Result Date: 07/30/2024 CLINICAL DATA:  Dizziness with nausea and vomiting EXAM: MRI HEAD WITHOUT AND WITH CONTRAST TECHNIQUE: Multiplanar, multiecho pulse sequences of the brain and surrounding structures were obtained without and with intravenous contrast. CONTRAST:  4mL GADAVIST  GADOBUTROL  1 MMOL/ML IV SOLN COMPARISON:  None Available. FINDINGS: MRI brain: There are 3 small old lacunar infarcts in the right cerebellar hemisphere and 1 in the left cerebellar hemisphere. There are a few small foci of T2 hyperintensity in the cerebral white matter. These do not have restricted diffusion or enhancement. No acute infarct. The ventricles are normal. No mass lesion. There are normal flow signals in the carotid arteries and basilar artery. Normal enhancement of the dural sinuses. No significant bone marrow signal abnormality. No  significant abnormality in the paranasal sinuses or soft tissues. IMPRESSION: Small old lacunar infarcts in both cerebellar hemispheres. No other significant abnormality. Electronically Signed   By: Nancyann Burns M.D.   On: 07/30/2024 11:12   DG Abd 1 View Result Date: 07/29/2024 CLINICAL DATA:  Vomiting for 1 month EXAM: ABDOMEN - 1 VIEW COMPARISON:  None Available. FINDINGS: Scattered large and small bowel gas is noted. Contrast is noted within the renal collecting systems and bladder related to recent CT. No bony abnormality is noted. No free air is seen. IMPRESSION: No acute abnormality noted. Electronically Signed   By: Oneil Devonshire M.D.   On: 07/29/2024 23:27   CT ANGIO HEAD NECK W WO CM Result Date: 07/29/2024 CLINICAL DATA:  Vertigo, nausea, vomiting, and photosensitivity. EXAM: CT ANGIOGRAPHY HEAD AND NECK WITH AND WITHOUT CONTRAST TECHNIQUE: Multidetector CT imaging of the head and neck was performed using the standard protocol during bolus administration of intravenous contrast. Multiplanar CT image reconstructions and MIPs were obtained to evaluate the vascular anatomy. Carotid stenosis measurements (when applicable) are obtained utilizing NASCET criteria, using the distal internal carotid diameter as the denominator. RADIATION DOSE REDUCTION: This exam was performed according to the departmental dose-optimization program which includes automated exposure control, adjustment of the mA and/or kV according to patient size and/or use of iterative reconstruction technique. CONTRAST:  75mL OMNIPAQUE  IOHEXOL  350 MG/ML SOLN COMPARISON:  CT sinuses 07/22/2024 FINDINGS: CT HEAD FINDINGS Brain: There is no evidence of an acute infarct, intracranial hemorrhage, mass, midline shift, or extra-axial fluid collection. There is a chronic insult in the anterior right basal ganglia region with ex vacuo dilatation of the frontal horn of the right lateral ventricle. The ventricles are otherwise normal in size.  Vascular: Calcified atherosclerosis at the skull base. No hyperdense vessel. Skull: No fracture or suspicious lesion. Sinuses/Orbits: Paranasal sinuses and mastoid air cells are well aerated. Bilateral cataract extraction. Other: None. Review of the MIP images confirms the above findings CTA NECK FINDINGS Aortic arch: Normal variant aortic arch branching pattern with common origin of the brachiocephalic and left common carotid arteries. Mild atherosclerosis without a significant stenosis of the arch vessel origins. Right carotid system: Patent with a small to moderate amount of calcified and soft plaque in the carotid bulb. No evidence of a significant stenosis or dissection. Left carotid system: Patent with a small amount of calcified and soft plaque in the carotid bulb. No evidence of a significant stenosis or dissection. Vertebral arteries: Patent with the left being moderately dominant. No evidence of a significant stenosis or dissection. Skeleton: Edentulous. Moderate cervical spondylosis. Prominent facet arthrosis at C7-T1 with grade 1 anterolisthesis. Other neck: No evidence of cervical lymphadenopathy or mass. Upper chest: Moderate to severe centrilobular emphysema. Review  of the MIP images confirms the above findings CTA HEAD FINDINGS Anterior circulation: The internal carotid arteries are patent from skull base to carotid termini with moderate atherosclerotic irregularity bilaterally. There are mild bilateral cavernous ICA stenoses. 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 ACAs and MCAs are patent without evidence of a proximal branch occlusion or significant proximal stenosis. Posterior circulation: The intracranial vertebral arteries are patent to the basilar with atherosclerotic irregularity bilaterally and a  mild-to-moderate stenosis of the proximal right V4 segment. Patent PICA, AICA, and SCA origins are visualized bilaterally. The basilar artery is patent and mildly irregular diffusely without a significant focal stenosis. There are small posterior communicating arteries bilaterally. Both PCAs are patent with mild diffuse irregularity but no flow limiting proximal stenosis. No aneurysm is identified. Venous sinuses: Patent. Anatomic variants: None. Review of the MIP images confirms the above findings IMPRESSION: 1. No evidence of acute intracranial abnormality. 2. Moderate atherosclerosis in the head and neck without a large vessel occlusion. 3. Mild bilateral cavernous ICA stenoses. 4. 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 5. Cervical carotid atherosclerosis without significant stenosis. 6. Aortic Atherosclerosis (ICD10-I70.0) and Emphysema (ICD10-J43.9). Electronically Signed   By: Dasie Hamburg M.D.   On: 07/29/2024 16:35   Scheduled Meds:  amLODipine   10 mg Oral Daily   bictegravir-emtricitabine -tenofovir  AF  1 tablet Oral Daily   bisacodyl   10 mg Rectal Once   enoxaparin  (LOVENOX ) injection  30 mg Subcutaneous Q24H   famotidine   20 mg Oral BID   feeding supplement  237 mL Oral TID BM   fluticasone  furoate-vilanterol  1 puff Inhalation Daily   loratadine   10 mg Oral Daily   pantoprazole   40 mg Oral Daily   rosuvastatin   40 mg Oral Daily   tuberculin  5 Units Intradermal STAT   umeclidinium bromide   1 puff Inhalation Daily   Continuous Infusions:  sodium chloride  100 mL/hr at 07/30/24 1220     LOS: 1 day    Time spent: 50 mins    Darcel Dawley, MD [facility]   If 7PM-7AM, please contact night-coverage

## 2024-07-30 NOTE — Plan of Care (Signed)

## 2024-07-30 NOTE — Consult Note (Signed)
 NEUROLOGY CONSULT NOTE   Date of service: July 30, 2024 Patient Name: Janice Williams MRN:  [id number] DOB:  [birth year] Chief Complaint: intractable nausea/vomiting Requesting Provider: Leotis Bogus, MD  History of Present Illness  Janice Williams is a 63 y.o. 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 Multiple antiemitics have been tried without success. She has tinnitus in BL ears for over a year now, mild L sided headache that started yesterday along with intermittent vertigo. Yesterday AM, had some dry heaving and came to the ED for further evaluation.  Reports that she lost over 8lbs over the last couple of months. She smokes marijuana every other day. She denies fever. Endorses chronic sinusitis and seen by ENT recently and did not feel that her nausea/vomiting was related to sinusitis.  She came to the ED due to dry heaving. She had LP in the ED with an opening pressure of 11cmH20, no CSF pleocytosis, meningitis/encephalitis panel negative specifically negative for cryptococcus Ag, prelim CSF cultures negative. She had CT head and CTA which was notable for   Neurology consulted for further evaluation and workup.    ROS  Comprehensive ROS performed and pertinent positives documented in HPI   Past History   Past Medical History:  Diagnosis Date   Anemia    Arthritis    generalized   Blood transfusion without reported diagnosis 2003   Bronchitis    Cataract    COPD (chronic obstructive pulmonary disease) (HCC)    COPD with acute exacerbation (HCC) 06/09/2017   Dental abscess 03/15/2015   DYSPEPSIA 02/14/2007   Qualifier: Diagnosis of  By: Manford Longs     EXTERNAL OTITIS 01/06/2010   Qualifier: Diagnosis of  By: Janna MD, Burnard     FACIAL RASH 02/04/2009   Qualifier: Diagnosis of  By: Cleotilde Pencil      Former smoker    quit 2014   GERD (gastroesophageal reflux disease)    Glaucoma    HIV infection (HCC)    Hypertension    Osteoporosis    Seasonal allergies    Stroke (HCC) 2003   Substance abuse (HCC)    history, clean 7 years   SVD (spontaneous vaginal delivery)    x 3    Past Surgical History:  Procedure Laterality Date   COLONOSCOPY  2019   KN-MAC-suprep(adeq)-tics/hems/TA x 5   MULTIPLE TOOTH EXTRACTIONS     with sedation   POLYPECTOMY  01/2008   TA x 5   TONSILLECTOMY  1973   TUBAL LIGATION     UPPER GI ENDOSCOPY     normal per patient - years ago    Family History: Family History  Problem Relation Age of Onset   Hypertension Mother    Hypertension Father    Hyperlipidemia Father    COPD Father    Atrial fibrillation Father    Colon cancer Neg Hx    Rectal cancer Neg Hx    Stomach cancer Neg Hx    Colon polyps Neg Hx    Esophageal cancer Neg Hx    BRCA 1/2 Neg Hx    Breast cancer Neg Hx     Social History  reports that she quit smoking about 10 years ago. Her smoking use included cigarettes. She started smoking about 11 years ago. She has a 76.1 pack-year smoking  history. She has been exposed to tobacco smoke. She has never used smokeless tobacco. She reports that she does not currently use alcohol. She reports that she does not use drugs.  Allergies  Allergen Reactions   Varenicline  Itching and Other (See Comments)    Medications   Current Facility-Administered Medications:    0.9 %  sodium chloride  infusion, , Intravenous, Continuous, Amponsah, Claretta HERO, MD, Last Rate: 100 mL/hr at 07/30/24 0000, New Bag at 07/30/24 0000   acetaminophen  (TYLENOL ) tablet 650 mg, 650 mg, Oral, Q6H PRN **OR** acetaminophen  (TYLENOL ) suppository 650 mg, 650 mg, Rectal, Q6H PRN, Lou, Claretta HERO, MD   amLODipine  (NORVASC ) tablet 10 mg, 10 mg, Oral, Daily, Amponsah, Claretta HERO, MD   bictegravir-emtricitabine -tenofovir  AF (BIKTARVY ) 50-200-25 MG per tablet 1  tablet, 1 tablet, Oral, Daily, Amponsah, Claretta HERO, MD   bisacodyl  (DULCOLAX) EC tablet 5 mg, 5 mg, Oral, Daily PRN, Lou, Claretta HERO, MD   bisacodyl  (DULCOLAX) suppository 10 mg, 10 mg, Rectal, Once, Amponsah, Claretta HERO, MD   enoxaparin  (LOVENOX ) injection 30 mg, 30 mg, Subcutaneous, Q24H, Amponsah, Claretta HERO, MD   famotidine  (PEPCID ) tablet 20 mg, 20 mg, Oral, BID, Amponsah, Claretta HERO, MD   feeding supplement (ENSURE ENLIVE / ENSURE PLUS) liquid 237 mL, 237 mL, Oral, TID BM, Amponsah, Claretta HERO, MD   fluticasone  furoate-vilanterol (BREO ELLIPTA ) 200-25 MCG/ACT 1 puff, 1 puff, Inhalation, Daily, Amponsah, Claretta HERO, MD   ipratropium-albuterol  (DUONEB) 0.5-2.5 (3) MG/3ML nebulizer solution 3 mL, 3 mL, Nebulization, Q6H PRN, Lou, Claretta HERO, MD   loratadine  (CLARITIN ) tablet 10 mg, 10 mg, Oral, Daily, Amponsah, Prosper M, MD   ondansetron  (ZOFRAN ) tablet 4 mg, 4 mg, Oral, Q6H PRN **OR** ondansetron  (ZOFRAN ) injection 4 mg, 4 mg, Intravenous, Q6H PRN, Lou Claretta HERO, MD, 4 mg at 07/30/24 0342   pantoprazole  (PROTONIX ) EC tablet 40 mg, 40 mg, Oral, Daily, Amponsah, Claretta HERO, MD   rosuvastatin  (CRESTOR ) tablet 40 mg, 40 mg, Oral, Daily, Amponsah, Claretta HERO, MD   senna-docusate (Senokot-S) tablet 1 tablet, 1 tablet, Oral, QHS PRN, Lou Claretta HERO, MD   tuberculin injection 5 Units, 5 Units, Intradermal, STAT, Bernis Ernst, PA-C, 5 Units at 07/29/24 2129   umeclidinium bromide  (INCRUSE ELLIPTA ) 62.5 MCG/ACT 1 puff, 1 puff, Inhalation, Daily, Lou Claretta HERO, MD  Vitals   Vitals:   07/29/24 1746 07/29/24 2254 07/30/24 0215 07/30/24 0347  BP: 109/80 (!) 144/83 124/85 (!) 138/91  Pulse: 93 (!) 106 (!) 103 (!) 113  Resp: 17 16 15 18   Temp: 98.4 F (36.9 C) 99 F (37.2 C)  98.2 F (36.8 C)  TempSrc: Oral Oral  Oral  SpO2: 94% 97% 94% 98%  Weight:      Height:        Body mass index is 15.23 kg/m.   Physical Exam   General: Laying comfortably in bed; in no acute  distress.  HENT: Normal oropharynx and mucosa. Normal external appearance of ears and nose.  Neck: Supple, no pain or tenderness  CV: No JVD. No peripheral edema.  Pulmonary: Symmetric Chest rise. Normal respiratory effort.  Abdomen: Soft to touch, non-tender.  Ext: No cyanosis, edema, or deformity  Skin: No rash. Normal palpation of skin.   Musculoskeletal: Normal digits and nails by inspection. No clubbing.   Neurologic Examination  Mental status/Cognition: Alert, oriented to self, place, month and year, good attention.  Speech/language: Fluent, comprehension intact, object naming intact, repetition intact.  Cranial nerves:   CN II Pupils equal and  reactive to light, no VF deficits    CN III,IV,VI EOM intact, no gaze preference or deviation, no nystagmus    CN V normal sensation in V1, V2, and V3 segments bilaterally    CN VII no asymmetry, no nasolabial fold flattening    CN VIII normal hearing to speech    CN IX & X normal palatal elevation, no uvular deviation   CN XI 5/5 head turn and 5/5 shoulder shrug bilaterally    CN XII midline tongue protrusion    Motor:  Muscle bulk: poor, tone normal, pronator drift none tremor none Mvmt Root Nerve  Muscle Right Left Comments  SA C5/6 Ax Deltoid 5 5   EF C5/6 Mc Biceps 5 5   EE C6/7/8 Rad Triceps 5 5   WF C6/7 Med FCR     WE C7/8 PIN ECU     F Ab C8/T1 U ADM/FDI 5 5   HF L1/2/3 Fem Illopsoas 4+ 5 RLE weakness at baseline from prior stroke, this is not worse than her baseline.  KE L2/3/4 Fem Quad 5 5   DF L4/5 D Peron Tib Ant 2 5   PF S1/2 Tibial Grc/Sol 4 5    Sensation:  Light touch Intact throughout   Pin prick    Temperature    Vibration   Proprioception    Coordination/Complex Motor:  - Finger to Nose intact BL - Heel to shin intact BL - Rapid alternating movement are normal - Gait: deferred.  Labs/Imaging/Neurodiagnostic studies   CBC:  Recent Labs  Lab Aug 22, 2024 1148  WBC 4.9  HGB 12.4  HCT 39.6  MCV  100.8*  PLT 269   Basic Metabolic Panel:  Lab Results  Component Value Date   NA 138 2024-08-22   K 3.7 2024-08-22   CO2 29 August 22, 2024   GLUCOSE 94 08/22/24   BUN 12 08/22/24   CREATININE 0.81 August 22, 2024   CALCIUM  9.9 22-Aug-2024   GFRNONAA >60 August 22, 2024   GFRAA 65 03/07/2021   Lipid Panel:  Lab Results  Component Value Date   LDLCALC 86 09/06/2023   HgbA1c:  Lab Results  Component Value Date   HGBA1C 5.5 12/21/2022   Urine Drug Screen: No results found for: LABOPIA, COCAINSCRNUR, LABBENZ, AMPHETMU, THCU, LABBARB  Alcohol Level No results found for: ETH INR No results found for: INR APTT No results found for: APTT AED levels: No results found for: PHENYTOIN, ZONISAMIDE, LAMOTRIGINE, LEVETIRACETA  CT Head without contrast(Personally reviewed): CTH was negative for a large hypodensity concerning for a large territory infarct or hyperdensity concerning for an ICH  CT angio Head and Neck with contrast(Personally reviewed): 1. No evidence of acute intracranial abnormality. 2. Moderate atherosclerosis in the head and neck without a large vessel occlusion. 3. Mild bilateral cavernous ICA stenoses. 4. 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 5. Cervical carotid atherosclerosis without significant stenosis. 6. Aortic Atherosclerosis (ICD10-I70.0) and Emphysema (ICD10-J43.9).  MRI Brain: pending  ASSESSMENT   Janice Williams is a 63 y.o. 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 Yesterday, had left sided headache along with vertigo with her nausea vomiting. Neurology consulted to evaluate for neurologic etiology of her intracrable nausea/vomiting and vertigo.  Neuro exam with no focal deficits. No incoorcination, no nystagmus. Prelim CSF studies are not concerning for  meningitis, opening  pressure not consistent with IIH. She is compliant with her Biktarvy  and CD4 count not sugestive of immunosuppression.  Perhaps her intractable nausea/vomiting is secondary to cannabis hyperemesis syndrome.  RECOMMENDATIONS  - MRI Brain w + w/o contrast and MRV head to rule out CVST or brainstem stroke. - Csf studies not consistent with meningitis. Opening pressure not suggestive of IIH. Preliminary CSF cultures are negative. - follow up outpatient with Neuro IR for noted aneurysms in L ICA. - UDS: uses cannabis every other day. Requested patient to stop using Marijuana. - neurology will follow up on MRI Brain, MRV and CSF cultures but we do not plan to see patient again in the hospital otherwise. Low overall suspicion for neurologic etiology of her nausea and vomiting if the imaging with MRI/MRV is negative. ______________________________________________________________________  Plan discussed with patient, her grand daughter at the bedside. Discussed with Dr. Leotis over secure chat. Discussed with Dr. Lindzen during signout over phone.  Signed, Ellsie Violette, MD [facility]

## 2024-07-30 NOTE — TOC Initial Note (Signed)
 Transition of Care [facility]) - Initial/Assessment Note    Patient Details  Name: Janice Williams MRN: [id number] Date of Birth: 04/25/1961  Transition of Care [facility]) CM/SW Contact:    Andrez JULIANNA George, RN Phone Number: 07/30/2024, 11:44 AM  Clinical Narrative:                  PCP: Sula Leavy Rode --pt has her first appointment with this new MD on the 27th of August.  Pt is from home with her son. Pt states he is with her all the time. She also has a caregiver 7 days a week for 3 hours a day.  Pt states her caregiver assists her with her medications.  Pt sues medicaid transportation.  No DME.  At d/c her niece will provide transportation home.  IP Care management following.  Expected Discharge Plan: Home/Self Care Barriers to Discharge: Continued Medical Work up   Patient Goals and CMS Choice            Expected Discharge Plan and Services   Discharge Planning Services: CM Consult   Living arrangements for the past 2 months: Apartment                                      Prior Living Arrangements/Services Living arrangements for the past 2 months: Apartment Lives with:: Adult Children Patient language and need for interpreter reviewed:: Yes Do you feel safe going back to the place where you live?: Yes        Care giver support system in place?: Yes (comment) Current home services: Homehealth aide Criminal Activity/Legal Involvement Pertinent to Current Situation/Hospitalization: No - Comment as needed  Activities of Daily Living      Permission Sought/Granted                  Emotional Assessment Appearance:: Appears stated age Attitude/Demeanor/Rapport: Engaged Affect (typically observed): Accepting Orientation: : Oriented to Self, Oriented to Place, Oriented to  Time, Oriented to Situation   Psych Involvement: No (comment)  Admission diagnosis:  Aneurysm of internal carotid artery [I67.1] Pulsatile tinnitus [H93.A9] Suicidal ideations  [R45.851] Intractable nausea and vomiting [R11.2] Intractable headache, unspecified chronicity pattern, unspecified headache type [R51.9] Nausea and vomiting, unspecified vomiting type [R11.2] 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   Pulmonary emphysema (HCC) 07/30/2024   Intractable nausea and vomiting 07/29/2024   Allergic rhinitis due to animal hair and dander 07/11/2024   Mild persistent asthma, uncomplicated 07/11/2024   Other chronic allergic conjunctivitis 07/11/2024   Other allergic rhinitis 07/11/2024   Vitamin D  deficiency 08/08/2023   Rash of hand 12/21/2022   Former cigarette smoker 11/16/2022   Sprain of calcaneofibular ligament 02/28/2022   Degenerative disc disease, cervical 10/13/2020   Osteoporosis 10/10/2019   COPD  GOLD 3  07/08/2019   Moderate protein-calorie malnutrition (HCC) 05/31/2018   Hypertension 06/08/2017   Hyperlipidemia 06/03/2009   Human immunodeficiency virus (HIV) disease (HCC) 10/29/2008   GERD 10/29/2008   CEREBROVASCULAR ACCIDENT, HX OF 10/29/2008   Symptoms concerning nutrition, metabolism, and development 02/14/2007   Rhinitis, chronic 02/14/2007   PCP:  Patient, No Pcp Per Pharmacy:   [id number] [address], [place] - [address] [place] [address] Phone: [phone] Fax:  [phone]     Social Drivers of Health (SDOH) Social History: SDOH Screenings   Food Insecurity: Low Risk  (02/28/2024)   Received from [facility]  Housing: Low Risk  (02/28/2024)   Received from [facility]  Transportation Needs: No Transportation Needs (02/28/2024)   Received from [facility]  Utilities: Low Risk  (02/28/2024)   Received from [facility]  Depression (PHQ2-9): Low Risk  (06/04/2024)  Tobacco Use: Medium Risk (07/29/2024)   SDOH  Interventions:     Readmission Risk Interventions     No data to display

## 2024-07-31 ENCOUNTER — Other Ambulatory Visit: Payer: Self-pay

## 2024-07-31 DIAGNOSIS — R112 Nausea with vomiting, unspecified: Secondary | ICD-10-CM | POA: Diagnosis not present

## 2024-07-31 LAB — HIV-1 RNA QUANT-NO REFLEX-BLD
HIV 1 RNA Quant: 50 {copies}/mL
LOG10 HIV-1 RNA: 1.699 {Log_copies}/mL

## 2024-07-31 LAB — URINE CULTURE: Culture: <10000 — AB

## 2024-07-31 LAB — T-HELPER CELLS (CD4) COUNT (NOT AT ARMC)
CD4 % Helper T Cell: 33 % (ref 33–65)
CD4 T Cell Abs: 509 /uL (ref 400–1790)

## 2024-07-31 MED ORDER — KETOROLAC TROMETHAMINE 15 MG/ML IJ SOLN
15.0000 mg | Freq: Once | INTRAMUSCULAR | Status: AC
  Administered 2024-07-31: 15 mg via INTRAVENOUS
  Filled 2024-07-31: qty 1

## 2024-07-31 MED ORDER — LACTATED RINGERS IV BOLUS
250.0000 mL | Freq: Once | INTRAVENOUS | Status: AC
  Administered 2024-07-31: 250 mL via INTRAVENOUS

## 2024-07-31 MED ORDER — BUTALBITAL-APAP-CAFFEINE 50-325-40 MG PO TABS
1.0000 | ORAL_TABLET | Freq: Two times a day (BID) | ORAL | Status: DC | PRN
  Administered 2024-07-31: 1 via ORAL
  Administered 2024-08-01: 2 via ORAL
  Administered 2024-08-01: 1 via ORAL
  Filled 2024-07-31 (×2): qty 1
  Filled 2024-07-31: qty 2

## 2024-07-31 MED ORDER — BUTALBITAL-APAP-CAFFEINE 50-325-40 MG PO TABS: 1.0000 | ORAL_TABLET | Freq: Once | ORAL | Status: DC

## 2024-07-31 MED ORDER — PROCHLORPERAZINE EDISYLATE 10 MG/2ML IJ SOLN
10.0000 mg | Freq: Once | INTRAMUSCULAR | Status: AC
  Administered 2024-07-31: 10 mg via INTRAVENOUS
  Filled 2024-07-31: qty 2

## 2024-07-31 MED ORDER — DIPHENHYDRAMINE HCL 25 MG PO CAPS
25.0000 mg | ORAL_CAPSULE | Freq: Once | ORAL | Status: AC
  Administered 2024-07-31: 25 mg via ORAL
  Filled 2024-07-31: qty 1

## 2024-07-31 NOTE — Plan of Care (Signed)

## 2024-07-31 NOTE — Progress Notes (Signed)
 PROGRESS NOTE    KEILEIGH VAHEY  FMW:[id number] DOB: [birth year] DOA: 07/29/2024 PCP: Patient, No Pcp Per   Brief Narrative:  This 63 y.o. female with PMH significant for CVA with residual right leg weakness, HIV on Biktarvy , COPD, chronic frontal sinusitis, HTN and anxiety who presented to the ED for evaluation of persistent nausea and vomiting for last one month patient has went to urgent care twice and was treated with multiple antibiotics and antiemetics without any significant relief.  She is unable to tolerate anything orally.  Resulting in significant weight loss over the last few weeks.  Patient also report left-sided headache associated with neck pain and tinnitus. Workup in the ED CTA head and neck shows mild bilateral ICA aneurysm and stenosis but no acute intracranial abnormality or no LVO.  Neurology and neurosurgery was consulted.  LP ruled out acute meningitis.  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 The cause of patient's nausea and vomiting remains unclear. Neurology consulted for intractable headache. Due to her immunocompromised  state, recommended LP and CSF studies Lumbar puncture and CSF analysis ruled out meningitis. Neurology recommended MRI and MRV which was unremarkable as well. Continue antiemetics for nausea and vomiting.  ICA stenosis: CT head and neck shows mild bilateral cavernous ICA stenosis and 6 mm and 3 mm left cavernous ICA aneurysms Neurosurgery consulted, recommends outpatient follow-up.   Essential HTN: BP stable with SBP in the 110s to 140s. Continue amlodipine .   COPD: Chronic and stable, no evidence of exacerbation. Continue home  bronchodilators As needed DuoNebs   Chronic sinusitis: Hx of TMJ Evaluated by ENT on 7/31 with unremarkable imaging As needed Tylenol  for headache. Follow-up with ENT and dentist in the outpatient.   HIV: Last HIV studies in January showed undetected viral load and CD4 count of 1,125 Check HIV RNA quant and CD4 count Continue Biktarvy    HLD Continue rosuvastatin .   Allergies Continue levocetirizine.   GERD Continue PPI and famotidine .     DVT prophylaxis: Lovenox  Code Status: Full code Family Communication: No family at bedside. Disposition Plan: Admitted for rule out CVA.   Consultants:  Neurology Neurosurgery  Procedures:  Antimicrobials:  Anti-infectives (From admission, onward)    Start     Dose/Rate Route Frequency Ordered Stop   07/30/24 1000  bictegravir-emtricitabine -tenofovir  AF (BIKTARVY ) 50-200-25 MG per tablet 1 tablet        1 tablet Oral Daily 07/30/24 0111         Subjective: Seen and examined at bedside. Overnight events noted. She reports nausea and vomiting is improving. She still reports having headache. Does not feel like ready to be discharged today.  Objective: Vitals:   07/30/24 2020 07/30/24 2357 07/31/24 0358 07/31/24 0810  BP: 109/76 107/83 114/76 111/76  Pulse: 91 88 85 82  Resp: 16 16 14 16   Temp: 98 F (36.7 C) 97.8 F (36.6 C) 98.2 F (36.8 C) 97.9 F (36.6 C)  TempSrc: Oral Oral Oral Oral  SpO2: 96% 97% 99% 97%  Weight:      Height:        Intake/Output Summary (Last 24 hours) at 07/31/2024 1401 Last data filed at 07/30/2024 2200 Gross per 24 hour  Intake  2086.4 ml  Output 0 ml  Net 2086.4 ml   Filed Weights   07/29/24 1139  Weight: 39 kg    Examination:  General exam: Appears calm and comfortable, not in any acute distress. Respiratory system: CTA. Respiratory effort normal.  RR 16 Cardiovascular system: S1 & S2 heard, RRR. No JVD, murmurs, rubs, gallops or clicks.  Gastrointestinal system: Abdomen is  nondistended, soft and nontender. Normal bowel sounds heard. Central nervous system: Alert and oriented x 3. No focal neurological deficits. Extremities: No edema, no cyanosis, no clubbing. Skin: No rashes, lesions or ulcers Psychiatry: Judgement and insight appear normal. Mood & affect appropriate.     Data Reviewed: I have personally reviewed following labs and imaging studies  CBC: Recent Labs  Lab 07/29/24 1148 07/30/24 0639  WBC 4.9 4.6  HGB 12.4 12.3  HCT 39.6 36.9  MCV 100.8* 97.9  PLT 269 236   Basic Metabolic Panel: Recent Labs  Lab 07/29/24 1148 07/30/24 0639  NA 138 139  K 3.7 3.6  CL 99 101  CO2 29 24  GLUCOSE 94 73  BUN 12 9  CREATININE 0.81 0.96  CALCIUM  9.9 9.7  MG  --  1.7  PHOS  --  2.9   GFR: Estimated Creatinine Clearance: 36.9 mL/min (by C-G formula based on SCr of 0.96 mg/dL). Liver Function Tests: Recent Labs  Lab 07/29/24 1148  AST 25  ALT 18  ALKPHOS 55  BILITOT 0.6  PROT 8.6*  ALBUMIN 4.2   Recent Labs  Lab 07/29/24 1148  LIPASE 38   No results for input(s): AMMONIA in the last 168 hours. Coagulation Profile: No results for input(s): INR, PROTIME in the last 168 hours. Cardiac Enzymes: No results for input(s): CKTOTAL, CKMB, CKMBINDEX, TROPONINI in the last 168 hours. BNP (last 3 results) No results for input(s): PROBNP in the last 8760 hours. HbA1C: No results for input(s): HGBA1C in the last 72 hours. CBG: No results for input(s): GLUCAP in the last 168 hours. Lipid Profile: No results for input(s): CHOL, HDL, LDLCALC, TRIG, CHOLHDL, LDLDIRECT in the last 72 hours. Thyroid  Function Tests: No results for input(s): TSH, T4TOTAL, FREET4, T3FREE, THYROIDAB in the last 72 hours. Anemia Panel: No results for input(s): VITAMINB12, FOLATE, FERRITIN, TIBC, IRON, RETICCTPCT in the last 72 hours. Sepsis Labs: No results for input(s): PROCALCITON, LATICACIDVEN in the last  168 hours.  Recent Results (from the past 240 hours)  Urine Culture     Status: Abnormal   Collection Time: 07/29/24  1:28 PM   Specimen: Urine, Clean Catch  Result Value Ref Range Status   Specimen Description   Final    URINE, CLEAN CATCH Performed at [facility], [address] [place], [place] [address]    Special Requests   Final    NONE Performed at [facility], [address] [place], [place] [address]    Culture (A)  Final    <10,000 COLONIES/mL INSIGNIFICANT GROWTH Performed at [facility], [address], [place], [place] [address]    Report Status 07/31/2024 FINAL  Final  CSF culture w Gram Stain     Status: None (Preliminary result)   Collection Time: 07/29/24  9:48 PM   Specimen: CSF; Cerebrospinal Fluid  Result Value Ref Range Status   Specimen Description   Final    CSF Performed at [facility], [address] [place], [place] [address]    Special Requests   Final  Immunocompromised Performed at [facility], [address] [place], [place] [address]    Gram Stain   Final    NO ORGANISMS SEEN OCCASIONAL LYMPHS SEEN CYTOSPUN SMEAR Performed at [facility], [address], [place], [place] [address]    Culture   Final    NO GROWTH 1 DAY Performed at [facility], [address], [place], [place] [address]    Report Status PENDING  Incomplete    Radiology Studies: MR Venogram Head Result Date: 07/30/2024 CLINICAL DATA:  Dizziness EXAM: MR VENOGRAM HEAD WITHOUT AND WITH CONTRAST TECHNIQUE: Angiographic images of the intracranial venous structures were acquired using MRV technique with intravenous contrast. CONTRAST:  4 mL Gadavist  administered intravenously COMPARISON:  None Available. FINDINGS: The superior sagittal sinus, transverse and sigmoid sinuses are normal. The straight sinus and deep veins are normal. IMPRESSION: Normal Electronically  Signed   By: Nancyann Burns M.D.   On: 07/30/2024 11:15   MR BRAIN W WO CONTRAST Result Date: 07/30/2024 CLINICAL DATA:  Dizziness with nausea and vomiting EXAM: MRI HEAD WITHOUT AND WITH CONTRAST TECHNIQUE: Multiplanar, multiecho pulse sequences of the brain and surrounding structures were obtained without and with intravenous contrast. CONTRAST:  4mL GADAVIST  GADOBUTROL  1 MMOL/ML IV SOLN COMPARISON:  None Available. FINDINGS: MRI brain: There are 3 small old lacunar infarcts in the right cerebellar hemisphere and 1 in the left cerebellar hemisphere. There are a few small foci of T2 hyperintensity in the cerebral white matter. These do not have restricted diffusion or enhancement. No acute infarct. The ventricles are normal. No mass lesion. There are normal flow signals in the carotid arteries and basilar artery. Normal enhancement of the dural sinuses. No significant bone marrow signal abnormality. No significant abnormality in the paranasal sinuses or soft tissues. IMPRESSION: Small old lacunar infarcts in both cerebellar hemispheres. No other significant abnormality. Electronically Signed   By: Nancyann Burns M.D.   On: 07/30/2024 11:12   DG Abd 1 View Result Date: 07/29/2024 CLINICAL DATA:  Vomiting for 1 month EXAM: ABDOMEN - 1 VIEW COMPARISON:  None Available. FINDINGS: Scattered large and small bowel gas is noted. Contrast is noted within the renal collecting systems and bladder related to recent CT. No bony abnormality is noted. No free air is seen. IMPRESSION: No acute abnormality noted. Electronically Signed   By: Oneil Devonshire M.D.   On: 07/29/2024 23:27   CT ANGIO HEAD NECK W WO CM Result Date: 07/29/2024 CLINICAL DATA:  Vertigo, nausea, vomiting, and photosensitivity. EXAM: CT ANGIOGRAPHY HEAD AND NECK WITH AND WITHOUT CONTRAST TECHNIQUE: Multidetector CT imaging of the head and neck was performed using the standard protocol during bolus administration of intravenous contrast. Multiplanar CT image  reconstructions and MIPs were obtained to evaluate the vascular anatomy. Carotid stenosis measurements (when applicable) are obtained utilizing NASCET criteria, using the distal internal carotid diameter as the denominator. RADIATION DOSE REDUCTION: This exam was performed according to the departmental dose-optimization program which includes automated exposure control, adjustment of the mA and/or kV according to patient size and/or use of iterative reconstruction technique. CONTRAST:  75mL OMNIPAQUE  IOHEXOL  350 MG/ML SOLN COMPARISON:  CT sinuses 07/22/2024 FINDINGS: CT HEAD FINDINGS Brain: There is no evidence of an acute infarct, intracranial hemorrhage, mass, midline shift, or extra-axial fluid collection. There is a chronic insult in the anterior right basal ganglia region with ex vacuo dilatation of the frontal horn of the right lateral ventricle. The ventricles are otherwise normal in size.  Vascular: Calcified atherosclerosis at the skull base. No hyperdense vessel. Skull: No fracture or suspicious lesion. Sinuses/Orbits: Paranasal sinuses and mastoid air cells are well aerated. Bilateral cataract extraction. Other: None. Review of the MIP images confirms the above findings CTA NECK FINDINGS Aortic arch: Normal variant aortic arch branching pattern with common origin of the brachiocephalic and left common carotid arteries. Mild atherosclerosis without a significant stenosis of the arch vessel origins. Right carotid system: Patent with a small to moderate amount of calcified and soft plaque in the carotid bulb. No evidence of a significant stenosis or dissection. Left carotid system: Patent with a small amount of calcified and soft plaque in the carotid bulb. No evidence of a significant stenosis or dissection. Vertebral arteries: Patent with the left being moderately dominant. No evidence of a significant stenosis or dissection. Skeleton: Edentulous. Moderate cervical spondylosis. Prominent facet arthrosis at  C7-T1 with grade 1 anterolisthesis. Other neck: No evidence of cervical lymphadenopathy or mass. Upper chest: Moderate to severe centrilobular emphysema. Review of the MIP images confirms the above findings CTA HEAD FINDINGS Anterior circulation: The internal carotid arteries are patent from skull base to carotid termini with moderate atherosclerotic irregularity bilaterally. There are mild bilateral cavernous ICA stenoses. 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 ACAs and MCAs are patent without evidence of a proximal branch occlusion or significant proximal stenosis. Posterior circulation: The intracranial vertebral arteries are patent to the basilar with atherosclerotic irregularity bilaterally and a mild-to-moderate stenosis of the proximal right V4 segment. Patent PICA, AICA, and SCA origins are visualized bilaterally. The basilar artery is patent and mildly irregular diffusely without a significant focal stenosis. There are small posterior communicating arteries bilaterally. Both PCAs are patent with mild diffuse irregularity but no flow limiting proximal stenosis. No aneurysm is identified. Venous sinuses: Patent. Anatomic variants: None. Review of the MIP images confirms the above findings IMPRESSION: 1. No evidence of acute intracranial abnormality. 2. Moderate atherosclerosis in the head and neck without a large vessel occlusion. 3. Mild bilateral cavernous ICA stenoses. 4. 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 5. Cervical carotid atherosclerosis without significant stenosis. 6. Aortic Atherosclerosis (ICD10-I70.0) and Emphysema (ICD10-J43.9). Electronically Signed   By: Dasie Hamburg M.D.   On: 07/29/2024 16:35   Scheduled Meds:  amLODipine   10 mg Oral Daily   bictegravir-emtricitabine -tenofovir  AF  1 tablet Oral  Daily   bisacodyl   10 mg Rectal Once   enoxaparin  (LOVENOX ) injection  30 mg Subcutaneous Q24H   famotidine   20 mg Oral BID   feeding supplement  237 mL Oral TID BM   feeding supplement  237 mL Oral BID BM   fluticasone  furoate-vilanterol  1 puff Inhalation Daily   loratadine   10 mg Oral Daily   pantoprazole   40 mg Oral Daily   rosuvastatin   40 mg Oral Daily   senna-docusate  1 tablet Oral BID   tuberculin  5 Units Intradermal STAT   umeclidinium bromide   1 puff Inhalation Daily   Continuous Infusions:     LOS: 2 days    Time spent: 35 mins    Darcel Dawley, MD [facility]   If 7PM-7AM, please contact night-coverage

## 2024-07-31 NOTE — Plan of Care (Signed)
  Problem: Education: Goal: Knowledge of General Education information will improve Description: Including pain rating scale, medication(s)/side effects and non-pharmacologic comfort measures Outcome: Progressing   Problem: Health Behavior/Discharge Planning: Goal: Ability to manage health-related needs will improve Outcome: Progressing   Problem: Clinical Measurements: Goal: Will remain free from infection Outcome: Progressing   Problem: Activity: Goal: Risk for activity intolerance will decrease Outcome: Progressing   Problem: Coping: Goal: Level of anxiety will decrease Outcome: Progressing   Problem: Safety: Goal: Ability to remain free from injury will improve Outcome: Progressing   Problem: Elimination: Goal: Will not experience complications related to bowel motility Outcome: Progressing

## 2024-07-31 NOTE — Progress Notes (Signed)
 Overnight note  Called regarding intractable headache for this patient.   63 yo with hx HIV, CVA, COPD, HTN and other issues here with intractable nausea/vomiting and HA.  Had LP and has had HA described as migraine since then.  Possible post LP HA?  Will trial fioricet .  Check EKG for QTc and trial HA cocktail if appropriate.  HA cocktail ordered.

## 2024-07-31 NOTE — Plan of Care (Signed)
 MRI brain w/wo contrast: Small old lacunar infarcts in both cerebellar hemispheres. No other significant abnormality.   MRV: The superior sagittal sinus, transverse and sigmoid sinuses are normal. The straight sinus and deep veins are normal.  CSF gram stain: No organisms seen. Occasional lymphs on cytospin smear.  CSF cultures pending.   CSF TB nucleic acid assay (MT-RIF NAA): pending AFB smear pending Acid fast culture pending  Electronically signed: Dr. Tagan Bartram

## 2024-08-01 ENCOUNTER — Other Ambulatory Visit (HOSPITAL_COMMUNITY): Payer: Self-pay

## 2024-08-01 DIAGNOSIS — R112 Nausea with vomiting, unspecified: Secondary | ICD-10-CM | POA: Diagnosis not present

## 2024-08-01 MED ORDER — BUTALBITAL-APAP-CAFFEINE 50-325-40 MG PO TABS
1.0000 | ORAL_TABLET | Freq: Two times a day (BID) | ORAL | 0 refills | Status: DC | PRN
  Filled 2024-08-01: qty 14, 4d supply, fill #0

## 2024-08-01 MED ORDER — PROCHLORPERAZINE EDISYLATE 10 MG/2ML IJ SOLN
10.0000 mg | Freq: Once | INTRAMUSCULAR | Status: AC
  Administered 2024-08-01: 10 mg via INTRAVENOUS
  Filled 2024-08-01: qty 2

## 2024-08-01 MED ORDER — SODIUM CHLORIDE 0.9 % IV BOLUS
1000.0000 mL | Freq: Once | INTRAVENOUS | Status: AC
  Administered 2024-08-01: 1000 mL via INTRAVENOUS

## 2024-08-01 MED ORDER — PANTOPRAZOLE SODIUM 40 MG PO TBEC
40.0000 mg | DELAYED_RELEASE_TABLET | Freq: Every day | ORAL | 0 refills | Status: DC
  Filled 2024-08-01: qty 30, 30d supply, fill #0

## 2024-08-01 NOTE — Discharge Summary (Signed)
 Physician Discharge Summary  SHARI NATT FMW:[id number] DOB: 09/03/1961 DOA: 07/29/2024  PCP: Patient, No Pcp Per  Admit date: 07/29/2024  Discharge date: 08/01/2024  Admitted From: Home  Disposition:  Home  Recommendations for Outpatient Follow-up:  Follow up with PCP in 1-2 weeks. Please obtain BMP/CBC in one week. Advised to take Fioricet  as needed for migraine headache. Advised to follow-up with Neurology as scheduled.  Home Health:None Equipment/Devices:None  Discharge Condition: Stable CODE STATUS:Full code Diet recommendation: Heart Healthy   Brief [facility] Course: This 63 y.o. female with PMH significant for CVA with residual right leg weakness, HIV on Biktarvy , COPD, chronic frontal sinusitis, HTN and anxiety who presented to the ED for evaluation of persistent nausea and vomiting for last one month. Patient has went to urgent care twice and was treated with multiple antibiotics and antiemetics without any significant relief.  She is unable to tolerate anything orally.  Resulting in significant weight loss over the last few weeks.  Patient also reportS left-sided headache associated with neck pain and tinnitus. Workup in the ED CTA head and neck shows mild bilateral ICA aneurysm and stenosis but no acute intracranial abnormality or no LVO.  Neurology and neurosurgery was consulted.  LP ruled out acute meningitis.  The cause for patient's nausea,  vomiting and headache is unclear likely migraine.  Neurology recommended MRI and MRV which was unremarkable as well.  Neurology recommended IV Compazine  as needed.  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  Patient feels much better and wants to be discharged home.  Neurology signed off.   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 The cause of patient's nausea and vomiting remains unclear. Neurology consulted for intractable headache. Due to her immunocompromised  state, recommended LP and CSF studies Lumbar puncture and CSF analysis ruled out meningitis. Neurology recommended MRI and MRV which was unremarkable as well. Continue antiemetics for nausea and vomiting. Neurology signed off, nausea and vomiting has improved.   Patient started on Fioricet .   ICA stenosis: CT head and neck shows mild bilateral cavernous ICA stenosis and 6 mm and 3 mm left cavernous ICA aneurysms Neurosurgery consulted, recommends outpatient follow-up.   Essential HTN: BP stable with SBP in the 110s to 140s. Continue amlodipine .   COPD: Chronic and stable, no evidence of exacerbation. Continue home bronchodilators As needed DuoNebs   Chronic sinusitis: Hx of TMJ Evaluated by ENT on 7/31 with unremarkable imaging. As needed Tylenol  for headache. Follow-up with ENT and dentist in the outpatient.   HIV: Last HIV studies in January showed undetected viral load and CD4 count of 1,125 Check HIV RNA quant and CD4 count Continue Biktarvy    HLD Continue rosuvastatin .   Allergies Continue levocetirizine.   GERD Continue PPI and famotidine .  Discharge Instructions  Discharge Instructions     Call MD for:  difficulty breathing, headache or visual disturbances   Complete by: As directed    Call MD for:  persistant dizziness or light-headedness   Complete by: As directed    Call MD for:  persistant nausea and vomiting   Complete by: As directed    Diet - low sodium heart healthy  Complete by: As directed    Diet Carb Modified   Complete by: As directed    Discharge instructions   Complete by: As directed    Advised to  follow-up with primary care physician in 1 week. Advised to take Fioricet  as needed for migraine headache. Advised to follow-up with neurology as scheduled.   Increase activity slowly   Complete by: As directed       Allergies as of 08/01/2024       Reactions   Varenicline  Itching, Other (See Comments)        Medication List     STOP taking these medications    docusate sodium  100 MG capsule Commonly known as: COLACE   hydrocortisone  2.5 % rectal cream Commonly known as: ANUSOL -HC   omeprazole  20 MG capsule Commonly known as: PRILOSEC Replaced by: pantoprazole  40 MG tablet       TAKE these medications    AeroChamber MV inhaler as directed for 30 days   albuterol  108 (90 Base) MCG/ACT inhaler Commonly known as: VENTOLIN  HFA Inhale 2 puffs into the lungs every 6 (six) hours as needed for wheezing or shortness of breath.   amLODipine  10 MG tablet Commonly known as: NORVASC  Take 1 tablet (10 mg total) by mouth daily.   Biktarvy  50-200-25 MG Tabs tablet Generic drug: bictegravir-emtricitabine -tenofovir  AF Take 1 tablet by mouth daily.   butalbital -acetaminophen -caffeine  50-325-40 MG tablet Commonly known as: FIORICET  Take 1-2 tablets by mouth 2 (two) times daily as needed for headache (intractable headache).   Ensure Take 237 mLs by mouth 3 (three) times daily between meals.   EpiPen  2-Pak 0.3 MG/0.3ML Soaj injection Generic drug: EPINEPHrine    famotidine  20 MG tablet Commonly known as: PEPCID  Take 1 tablet (20 mg total) by mouth 2 (two) times daily.   fluticasone  50 MCG/ACT nasal spray Commonly known as: FLONASE  Use 1 spray(s) in each nostril once daily   levocetirizine 5 MG tablet Commonly known as: XYZAL  Take 1 tablet (5 mg total) by mouth every evening.   Lidocaine -Hydrocortisone  Ace 1-3 % Kit Place 1 Application rectally 2 (two) times daily as needed.   Miebo 1.338 GM/ML Soln Generic drug: Perfluorohexyloctane   mirtazapine  15 MG  tablet Commonly known as: Remeron  Take 1 tablet (15 mg total) by mouth at bedtime.   ondansetron  4 MG disintegrating tablet Commonly known as: ZOFRAN -ODT Take 1 tablet (4 mg total) by mouth every 8 (eight) hours as needed for nausea or vomiting.   pantoprazole  40 MG tablet Commonly known as: PROTONIX  Take 1 tablet (40 mg total) by mouth daily. Start taking on: August 02, 2024 Replaces: omeprazole  20 MG capsule   prednisoLONE acetate 1 % ophthalmic suspension Commonly known as: PRED FORTE 1 drop 4 (four) times daily.   PRESCRIPTION MEDICATION Inject 1 Dose into the skin once a week. Allergy  shot in office on Fridays   Restasis 0.05 % ophthalmic emulsion Generic drug: cycloSPORINE 1 drop 2 (two) times daily.   rosuvastatin  40 MG tablet Commonly known as: CRESTOR  Take 1 tablet (40 mg total) by mouth daily.   Spiriva  Respimat 2.5 MCG/ACT Aers Generic drug: Tiotropium Bromide  Monohydrate Inhale 2 puffs into the lungs daily.   Symbicort  160-4.5 MCG/ACT inhaler Generic drug: budesonide -formoterol  Inhale 2 puffs into the lungs 2 (two) times daily.   traMADol  50 MG tablet Commonly known as: ULTRAM  TAKE 1 TABLET BY MOUTH EVERY 6 HOURS AS NEEDED.        Follow-up Information     [facility]  Follow up in 1 week(s).   Contact information: [address] [place] [address] [phone]                Allergies  Allergen Reactions   Varenicline  Itching and Other (See Comments)    Consultations: Neurology   Procedures/Studies: MR Venogram Head Result Date: 07/30/2024 CLINICAL DATA:  Dizziness EXAM: MR VENOGRAM HEAD WITHOUT AND WITH CONTRAST TECHNIQUE: Angiographic images of the intracranial venous structures were acquired using MRV technique with intravenous contrast. CONTRAST:  4 mL Gadavist  administered intravenously COMPARISON:  None Available. FINDINGS: The superior sagittal sinus, transverse and sigmoid sinuses are normal.  The straight sinus and deep veins are normal. IMPRESSION: Normal Electronically Signed   By: Nancyann Burns M.D.   On: 07/30/2024 11:15   MR BRAIN W WO CONTRAST Result Date: 07/30/2024 CLINICAL DATA:  Dizziness with nausea and vomiting EXAM: MRI HEAD WITHOUT AND WITH CONTRAST TECHNIQUE: Multiplanar, multiecho pulse sequences of the brain and surrounding structures were obtained without and with intravenous contrast. CONTRAST:  4mL GADAVIST  GADOBUTROL  1 MMOL/ML IV SOLN COMPARISON:  None Available. FINDINGS: MRI brain: There are 3 small old lacunar infarcts in the right cerebellar hemisphere and 1 in the left cerebellar hemisphere. There are a few small foci of T2 hyperintensity in the cerebral white matter. These do not have restricted diffusion or enhancement. No acute infarct. The ventricles are normal. No mass lesion. There are normal flow signals in the carotid arteries and basilar artery. Normal enhancement of the dural sinuses. No significant bone marrow signal abnormality. No significant abnormality in the paranasal sinuses or soft tissues. IMPRESSION: Small old lacunar infarcts in both cerebellar hemispheres. No other significant abnormality. Electronically Signed   By: Nancyann Burns M.D.   On: 07/30/2024 11:12   DG Abd 1 View Result Date: 07/29/2024 CLINICAL DATA:  Vomiting for 1 month EXAM: ABDOMEN - 1 VIEW COMPARISON:  None Available. FINDINGS: Scattered large and small bowel gas is noted. Contrast is noted within the renal collecting systems and bladder related to recent CT. No bony abnormality is noted. No free air is seen. IMPRESSION: No acute abnormality noted. Electronically Signed   By: Oneil Devonshire M.D.   On: 07/29/2024 23:27   CT ANGIO HEAD NECK W WO CM Result Date: 07/29/2024 CLINICAL DATA:  Vertigo, nausea, vomiting, and photosensitivity. EXAM: CT ANGIOGRAPHY HEAD AND NECK WITH AND WITHOUT CONTRAST TECHNIQUE: Multidetector CT imaging of the head and neck was performed using the standard  protocol during bolus administration of intravenous contrast. Multiplanar CT image reconstructions and MIPs were obtained to evaluate the vascular anatomy. Carotid stenosis measurements (when applicable) are obtained utilizing NASCET criteria, using the distal internal carotid diameter as the denominator. RADIATION DOSE REDUCTION: This exam was performed according to the departmental dose-optimization program which includes automated exposure control, adjustment of the mA and/or kV according to patient size and/or use of iterative reconstruction technique. CONTRAST:  75mL OMNIPAQUE  IOHEXOL  350 MG/ML SOLN COMPARISON:  CT sinuses 07/22/2024 FINDINGS: CT HEAD FINDINGS Brain: There is no evidence of an acute infarct, intracranial hemorrhage, mass, midline shift, or extra-axial fluid collection. There is a chronic insult in the anterior right basal ganglia region with ex vacuo dilatation of the frontal horn of the right lateral ventricle. The ventricles are otherwise normal in size. Vascular: Calcified atherosclerosis at the skull base. No hyperdense vessel. Skull: No fracture or suspicious lesion. Sinuses/Orbits: Paranasal sinuses and mastoid air cells are well aerated. Bilateral cataract extraction. Other: None. Review of  the MIP images confirms the above findings CTA NECK FINDINGS Aortic arch: Normal variant aortic arch branching pattern with common origin of the brachiocephalic and left common carotid arteries. Mild atherosclerosis without a significant stenosis of the arch vessel origins. Right carotid system: Patent with a small to moderate amount of calcified and soft plaque in the carotid bulb. No evidence of a significant stenosis or dissection. Left carotid system: Patent with a small amount of calcified and soft plaque in the carotid bulb. No evidence of a significant stenosis or dissection. Vertebral arteries: Patent with the left being moderately dominant. No evidence of a significant stenosis or dissection.  Skeleton: Edentulous. Moderate cervical spondylosis. Prominent facet arthrosis at C7-T1 with grade 1 anterolisthesis. Other neck: No evidence of cervical lymphadenopathy or mass. Upper chest: Moderate to severe centrilobular emphysema. Review of the MIP images confirms the above findings CTA HEAD FINDINGS Anterior circulation: The internal carotid arteries are patent from skull base to carotid termini with moderate atherosclerotic irregularity bilaterally. There are mild bilateral cavernous ICA stenoses. 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 ACAs and MCAs are patent without evidence of a proximal branch occlusion or significant proximal stenosis. Posterior circulation: The intracranial vertebral arteries are patent to the basilar with atherosclerotic irregularity bilaterally and a mild-to-moderate stenosis of the proximal right V4 segment. Patent PICA, AICA, and SCA origins are visualized bilaterally. The basilar artery is patent and mildly irregular diffusely without a significant focal stenosis. There are small posterior communicating arteries bilaterally. Both PCAs are patent with mild diffuse irregularity but no flow limiting proximal stenosis. No aneurysm is identified. Venous sinuses: Patent. Anatomic variants: None. Review of the MIP images confirms the above findings IMPRESSION: 1. No evidence of acute intracranial abnormality. 2. Moderate atherosclerosis in the head and neck without a large vessel occlusion. 3. Mild bilateral cavernous ICA stenoses. 4. 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 5. Cervical carotid atherosclerosis without significant stenosis. 6. Aortic Atherosclerosis (ICD10-I70.0) and Emphysema (ICD10-J43.9). Electronically Signed   By: Dasie Hamburg M.D.   On: 07/29/2024 16:35    Subjective: Patient  was seen and examined at bedside. Overnight events noted.   Patient reports nausea and vomiting has improved.  Headache is improving with Fioricet ,  states she wants to be discharged.  Discharge Exam: Vitals:   08/01/24 0409 08/01/24 0808  BP: 107/82 103/89  Pulse: 89 91  Resp:  16  Temp: 98.1 F (36.7 C) 98 F (36.7 C)  SpO2: 97% 98%   Vitals:   07/31/24 2050 08/01/24 0019 08/01/24 0409 08/01/24 0808  BP: 116/86 114/82 107/82 103/89  Pulse: 89 87 89 91  Resp:  18  16  Temp: 98.3 F (36.8 C) 98.2 F (36.8 C) 98.1 F (36.7 C) 98 F (36.7 C)  TempSrc: Oral Oral Oral Oral  SpO2: 98% 98% 97% 98%  Weight:      Height:        General: Pt is alert, awake, not in acute distress Cardiovascular: RRR, S1/S2 +, no rubs, no gallops Respiratory: CTA bilaterally, no wheezing, no rhonchi Abdominal: Soft, NT, ND, bowel sounds + Extremities: no edema, no cyanosis    The results of significant diagnostics from this hospitalization (including imaging, microbiology, ancillary and laboratory) are listed below for reference.     Microbiology: Recent Results (  from the past 240 hours)  Urine Culture     Status: Abnormal   Collection Time: 07/29/24  1:28 PM   Specimen: Urine, Clean Catch  Result Value Ref Range Status   Specimen Description   Final    URINE, CLEAN CATCH Performed at [facility], [address] [place], [place] [address]    Special Requests   Final    NONE Performed at [facility], [address], [place], [place] [address]    Culture (A)  Final    <10,000 COLONIES/mL INSIGNIFICANT GROWTH Performed at [facility], [address] [place], [place] [address]    Report Status 07/31/2024 FINAL  Final  CSF culture w Gram Stain     Status: None (Preliminary result)   Collection Time: 07/29/24  9:48 PM   Specimen: CSF; Cerebrospinal Fluid  Result Value Ref Range Status   Specimen Description   Final    CSF Performed at  [facility], [address], [place], [place] [address]    Special Requests   Final    Immunocompromised Performed at [facility], [address], [place], [place] [address]    Gram Stain   Final    NO ORGANISMS SEEN OCCASIONAL LYMPHS SEEN CYTOSPUN SMEAR Performed at [facility], [address] [place], [place] [address]    Culture   Final    NO GROWTH 1 DAY Performed at [facility], [address], [place], [place] [address]    Report Status PENDING  Incomplete     Labs: BNP (last 3 results) No results for input(s): BNP in the last 8760 hours. Basic Metabolic Panel: Recent Labs  Lab 07/29/24 1148 07/30/24 0639  NA 138 139  K 3.7 3.6  CL 99 101  CO2 29 24  GLUCOSE 94 73  BUN 12 9  CREATININE 0.81 0.96  CALCIUM  9.9 9.7  MG  --  1.7  PHOS  --  2.9   Liver Function Tests: Recent Labs  Lab 07/29/24 1148  AST 25  ALT 18  ALKPHOS 55  BILITOT 0.6  PROT 8.6*  ALBUMIN 4.2   Recent Labs  Lab 07/29/24 1148  LIPASE 38   No results for input(s): AMMONIA in the last 168 hours. CBC: Recent Labs  Lab 07/29/24 1148 07/30/24 0639  WBC 4.9 4.6  HGB 12.4 12.3  HCT 39.6 36.9  MCV 100.8* 97.9  PLT 269 236   Cardiac Enzymes: No results for input(s): CKTOTAL, CKMB, CKMBINDEX, TROPONINI in the last 168 hours. BNP: Invalid input(s): POCBNP CBG: No results for input(s): GLUCAP in the last 168 hours. D-Dimer No results for input(s): DDIMER in the last 72 hours. Hgb A1c No results for input(s): HGBA1C in the last 72 hours. Lipid Profile No results for input(s): CHOL, HDL, LDLCALC, TRIG, CHOLHDL, LDLDIRECT in the last 72 hours. Thyroid  function studies No results for input(s): TSH, T4TOTAL, T3FREE, THYROIDAB in the last 72 hours.  Invalid input(s): FREET3 Anemia work up No results for input(s): VITAMINB12, FOLATE, FERRITIN, TIBC, IRON, RETICCTPCT  in the last 72 hours. Urinalysis    Component Value Date/Time   COLORURINE STRAW (A) 07/29/2024 1328   APPEARANCEUR CLEAR 07/29/2024 1328   LABSPEC 1.005 07/29/2024 1328   PHURINE 7.0 07/29/2024 1328   GLUCOSEU NEGATIVE 07/29/2024 1328   HGBUR NEGATIVE 07/29/2024 1328   BILIRUBINUR NEGATIVE 07/29/2024 1328   KETONESUR NEGATIVE 07/29/2024 1328   PROTEINUR NEGATIVE 07/29/2024 1328  UROBILINOGEN 0.2 01/21/2009 1218   NITRITE NEGATIVE 07/29/2024 1328   LEUKOCYTESUR MODERATE (A) 07/29/2024 1328   Sepsis Labs Recent Labs  Lab 07/29/24 1148 07/30/24 0639  WBC 4.9 4.6   Microbiology Recent Results (from the past 240 hours)  Urine Culture     Status: Abnormal   Collection Time: 07/29/24  1:28 PM   Specimen: Urine, Clean Catch  Result Value Ref Range Status   Specimen Description   Final    URINE, CLEAN CATCH Performed at [facility], [address] [place], [place] [address]    Special Requests   Final    NONE Performed at [facility], [address] [place], [place] [address]    Culture (A)  Final    <10,000 COLONIES/mL INSIGNIFICANT GROWTH Performed at [facility], [address], [place], [place] [address]    Report Status 07/31/2024 FINAL  Final  CSF culture w Gram Stain     Status: None (Preliminary result)   Collection Time: 07/29/24  9:48 PM   Specimen: CSF; Cerebrospinal Fluid  Result Value Ref Range Status   Specimen Description   Final    CSF Performed at [facility], [address] [place], [place] [address]    Special Requests   Final    Immunocompromised Performed at [facility], [address], [place], [place] [address]    Gram Stain   Final    NO ORGANISMS SEEN OCCASIONAL LYMPHS SEEN CYTOSPUN SMEAR Performed at [facility], [address], [place], [place] [address]    Culture   Final    NO GROWTH 1 DAY Performed at [facility],  [address], [place], [place] [address]    Report Status PENDING  Incomplete     Time coordinating discharge: Over 30 minutes  SIGNED:   Darcel Dawley, MD  [facility] 08/01/2024, 11:27 AM Pager   If 7PM-7AM, please contact night-coverage

## 2024-08-01 NOTE — Discharge Instructions (Signed)
 Advised to follow-up with primary care physician in 1 week. Advised to take Fioricet  as needed for migraine headache. Advised to follow-up with neurology as scheduled.

## 2024-08-01 NOTE — Telephone Encounter (Signed)
 Pt called to inform Dr. Luiz she will be d/c today. Lorenda CHRISTELLA Code, RMA

## 2024-08-01 NOTE — TOC Transition Note (Signed)
 Transition of Care [facility]) - Discharge Note   Patient Details  Name: Janice Williams MRN: [id number] Date of Birth: [birth year]  Transition of Care [facility]) CM/SW Contact:  Andrez JULIANNA George, RN Phone Number: 08/01/2024, 11:04 AM   Clinical Narrative:     Pt is discharging home with self care. Pt has transportation home.  SDOH Interventions Today    Flowsheet Row Most Recent Value  SDOH Interventions   Food Insecurity Interventions [employer] Resources Referral, Inpatient TOC, Community Resources Provided     Final next level of care: Home/Self Care Barriers to Discharge: No Barriers Identified   Patient Goals and CMS Choice            Discharge Placement                       Discharge Plan and Services Additional resources added to the After Visit Summary for     Discharge Planning Services: CM Consult                                 Social Drivers of Health (SDOH) Interventions SDOH Screenings   Food Insecurity: Food Insecurity Present (07/30/2024)  Housing: Low Risk  (07/30/2024)  Transportation Needs: No Transportation Needs (07/30/2024)  Utilities: Not At Risk (07/30/2024)  Depression (PHQ2-9): Low Risk  (06/04/2024)  Tobacco Use: Medium Risk (07/29/2024)     Readmission Risk Interventions     No data to display

## 2024-08-02 LAB — QUANTIFERON-TB GOLD PLUS (RQFGPL)
QuantiFERON Mitogen Value: 3.21 [IU]/mL
QuantiFERON Nil Value: 0.02 [IU]/mL
QuantiFERON TB1 Ag Value: 0.06 [IU]/mL
QuantiFERON TB2 Ag Value: 0.06 [IU]/mL

## 2024-08-02 LAB — CSF CULTURE W GRAM STAIN: Culture: NO GROWTH

## 2024-08-02 LAB — QUANTIFERON-TB GOLD PLUS: QuantiFERON-TB Gold Plus: NEGATIVE

## 2024-08-04 ENCOUNTER — Other Ambulatory Visit (HOSPITAL_COMMUNITY): Payer: Self-pay

## 2024-08-04 ENCOUNTER — Telehealth: Payer: Self-pay | Admitting: Diagnostic Neuroimaging

## 2024-08-04 ENCOUNTER — Other Ambulatory Visit: Payer: Self-pay

## 2024-08-04 LAB — HSV CULTURE AND TYPING

## 2024-08-04 NOTE — Telephone Encounter (Addendum)
 Received voicemail from Ham[place], states she either needs 2 days notice to get transportation for mom or a Friday afternoon when she has off of work to bring her. Called back and had to LVM Appt on hold next Monday with Dr. Margaret if she calls back.

## 2024-08-04 NOTE — Progress Notes (Signed)
 Specialty Pharmacy Refill Coordination Note  Spoke with Talyia Allende is a 63 y.o. female contacted today regarding refills of specialty medication(s) Bictegravir-Emtricitab-Tenofov (Biktarvy )  Doses on hand: 4  Patient requested: Delivery   Delivery date: 08/06/24   Verified address: [address] [place] [address]  Medication will be filled on 08/05/24.

## 2024-08-04 NOTE — Telephone Encounter (Signed)
 Patient's granddaughter Maybell called this morning to set up an appointment for her per the ED-no referral was entered, but I do see where they mention patient should follow up with us  in 1 week. I advised I would work on finding an appointment for Janice Williams and give her a call back asap. I've left 2 VMs to see if they'd be able to come in tomorrow. If she calls back, I have a spot on hold for tomorrow at 11:00 with Dr. Onita.

## 2024-08-05 ENCOUNTER — Other Ambulatory Visit: Payer: Self-pay

## 2024-08-05 NOTE — Progress Notes (Signed)
 Clinical Intervention Note  Clinical Intervention Notes: Patient recently hospitalized and started on pantoprazole  and Acetaminophen /caffeine /butalbital  (Fioricet ). No DDIs identified with Fioricet  and Biktarvy . Per [employer], there is an interaction between PPIs and Biktarvy , increasing the risk of side effects. Spoke with patient and she is taking pantoprazole  first thing in the morning and Biktarvy  at night and is aware that the 2 need to remain separated. Advised her to talk with Dr. Juli at follow up appointment in the event that Dr. Glynis would prefer her to be back on omeprazole  since there are no DDIs between Biktarvy  and omeprazole . Patient was understanding and had no further questions.   Clinical Intervention Outcomes: Prevention of an adverse drug event   Advertising account planner

## 2024-08-06 LAB — ACID FAST SMEAR (AFB, MYCOBACTERIA): Acid Fast Smear: NEGATIVE

## 2024-08-08 ENCOUNTER — Other Ambulatory Visit (HOSPITAL_COMMUNITY): Payer: Self-pay

## 2024-08-10 ENCOUNTER — Other Ambulatory Visit: Payer: Self-pay | Admitting: Internal Medicine

## 2024-08-10 DIAGNOSIS — J301 Allergic rhinitis due to pollen: Secondary | ICD-10-CM

## 2024-08-11 ENCOUNTER — Ambulatory Visit: Admitting: Diagnostic Neuroimaging

## 2024-08-11 ENCOUNTER — Encounter: Payer: Self-pay | Admitting: Diagnostic Neuroimaging

## 2024-08-11 VITALS — BP 126/78 | HR 93 | Ht 63.0 in | Wt 85.8 lb

## 2024-08-11 DIAGNOSIS — I671 Cerebral aneurysm, nonruptured: Secondary | ICD-10-CM

## 2024-08-11 DIAGNOSIS — I672 Cerebral atherosclerosis: Secondary | ICD-10-CM | POA: Diagnosis not present

## 2024-08-11 NOTE — Telephone Encounter (Signed)
 Appt 8/27 with yourself and PCP.  Please advise

## 2024-08-11 NOTE — Progress Notes (Signed)
 [facility]  PATIENT: Janice Williams DOB: 1961-12-04  REFERRING CLINICIAN: No ref. provider found HISTORY FROM: PATIENT  REASON FOR VISIT: NEW CONSULT   HISTORICAL  CHIEF COMPLAINT:  Chief Complaint  Patient presents with   New Patient (Initial Visit)    RM 7, Pt alone, referred by hospital for headaches, stenosis. Pt in hospital recently for a headache, states she was told she has an aneurysm. Pt states she is getting her appetite back but is on Ensure supplements TID.    HISTORY OF PRESENT ILLNESS:   63 year old female here for evaluation of intractable headaches, intracranial aneurysms, intracranial stenosis.  Her past 3 days patient is having increasing nausea and vomiting, associate with headaches.  Went to the hospital for evaluation.  Had MRI brain, MR venogram head, CT angiogram of the head and neck.  She was found to have incidental intracranial stenosis and intracranial aneurysms.  These are felt to be incidental and unruptured findings.  Patient was diagnosed with migraine as well as cannabis associated hyperemesis syndrome.  Patient has stopped using cannabis and symptoms have resolved.   REVIEW OF SYSTEMS: Full 14 system review of systems performed and negative with exception of: as per HPI.   ALLERGIES: Allergies  Allergen Reactions   Varenicline  Itching and Other (See Comments)    HOME MEDICATIONS: Outpatient Medications Prior to Visit  Medication Sig Dispense Refill   albuterol  (VENTOLIN  HFA) 108 (90 Base) MCG/ACT inhaler Inhale 2 puffs into the lungs every 6 (six) hours as needed for wheezing or shortness of breath. 18 g 1   amLODipine  (NORVASC ) 10 MG tablet Take 1 tablet (10 mg total) by mouth daily. 90 tablet 3   bictegravir-emtricitabine -tenofovir  AF (BIKTARVY ) 50-200-25 MG TABS tablet Take 1 tablet by mouth daily. 30 tablet 5   butalbital -acetaminophen -caffeine  (FIORICET ) 50-325-40 MG tablet Take 1-2 tablets by mouth 2 (two) times  daily as needed for headache (intractable headache). 14 tablet 0   Ensure (ENSURE) Take 237 mLs by mouth 3 (three) times daily between meals. 237 mL 5   fluticasone  (FLONASE ) 50 MCG/ACT nasal spray Use 1 spray(s) in each nostril once daily 16 g 0   levocetirizine (XYZAL ) 5 MG tablet Take 1 tablet (5 mg total) by mouth every evening. 90 tablet 3   MIEBO 1.338 GM/ML SOLN      mirtazapine  (REMERON ) 15 MG tablet Take 1 tablet (15 mg total) by mouth at bedtime. (Patient taking differently: Take 15 mg by mouth at bedtime as needed.) 30 tablet 11   ondansetron  (ZOFRAN -ODT) 4 MG disintegrating tablet Take 1 tablet (4 mg total) by mouth every 8 (eight) hours as needed for nausea or vomiting. 20 tablet 0   pantoprazole  (PROTONIX ) 40 MG tablet Take 1 tablet (40 mg total) by mouth daily. 30 tablet 0   RESTASIS 0.05 % ophthalmic emulsion 1 drop 2 (two) times daily.     rosuvastatin  (CRESTOR ) 40 MG tablet Take 1 tablet (40 mg total) by mouth daily. 90 tablet 3   SYMBICORT  160-4.5 MCG/ACT inhaler Inhale 2 puffs into the lungs 2 (two) times daily.     Tiotropium Bromide  Monohydrate (SPIRIVA  RESPIMAT) 2.5 MCG/ACT AERS Inhale 2 puffs into the lungs daily. 4 g 11   traMADol  (ULTRAM ) 50 MG tablet TAKE 1 TABLET BY MOUTH EVERY 6 HOURS AS NEEDED. 60 tablet 3   EPIPEN  2-PAK 0.3 MG/0.3ML SOAJ injection      famotidine  (PEPCID ) 20 MG tablet Take 1 tablet (20 mg total) by mouth 2 (two)  times daily. 30 tablet 0   Lidocaine -Hydrocortisone  Ace 1-3 % KIT Place 1 Application rectally 2 (two) times daily as needed. 14 kit 0   prednisoLONE acetate (PRED FORTE) 1 % ophthalmic suspension 1 drop 4 (four) times daily.     PRESCRIPTION MEDICATION Inject 1 Dose into the skin once a week. Allergy  shot in office on Fridays     Spacer/Aero-Holding Chambers (AEROCHAMBER MV) inhaler as directed for 30 days     No facility-administered medications prior to visit.    PAST MEDICAL HISTORY: Past Medical History:  Diagnosis Date   Anemia     Arthritis    generalized   Blood transfusion without reported diagnosis 2003   Bronchitis    Cataract    COPD (chronic obstructive pulmonary disease) (HCC)    COPD with acute exacerbation (HCC) 06/09/2017   Dental abscess 03/15/2015   DYSPEPSIA 02/14/2007   Qualifier: Diagnosis of  By: Manford Longs     EXTERNAL OTITIS 01/06/2010   Qualifier: Diagnosis of  By: Janna MD, Burnard     FACIAL RASH 02/04/2009   Qualifier: Diagnosis of  By: Cleotilde Pencil     Former smoker    quit 2014   GERD (gastroesophageal reflux disease)    Glaucoma    HIV infection (HCC)    Hypertension    Osteoporosis    Seasonal allergies    Stroke (HCC) 2003   Substance abuse (HCC)    history, clean 7 years   SVD (spontaneous vaginal delivery)    x 3    PAST SURGICAL HISTORY: Past Surgical History:  Procedure Laterality Date   COLONOSCOPY  2019   KN-MAC-suprep(adeq)-tics/hems/TA x 5   MULTIPLE TOOTH EXTRACTIONS     with sedation   POLYPECTOMY  01/2008   TA x 5   TONSILLECTOMY  1973   TUBAL LIGATION     UPPER GI ENDOSCOPY     normal per patient - years ago    FAMILY HISTORY: Family History  Problem Relation Age of Onset   Hypertension Mother    Hypertension Father    Hyperlipidemia Father    COPD Father    Atrial fibrillation Father    Colon cancer Neg Hx    Rectal cancer Neg Hx    Stomach cancer Neg Hx    Colon polyps Neg Hx    Esophageal cancer Neg Hx    BRCA 1/2 Neg Hx    Breast cancer Neg Hx     SOCIAL HISTORY: Social History   Socioeconomic History   Marital status: Single    Spouse name: Not on file   Number of children: 3   Years of education: Not on file   Highest education level: Not on file  Occupational History   Not on file  Tobacco Use   Smoking status: Former    Current packs/day: 0.00    Average packs/day: 2.0 packs/day for 38.0 years (76.1 ttl pk-yrs)    Types: Cigarettes    Start date: 12/18/2012    Quit date: 11/21/2013    Years since quitting: 10.7     Passive exposure: Past   Smokeless tobacco: Never  Vaping Use   Vaping status: Never Used  Substance and Sexual Activity   Alcohol use: Not Currently   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   Sexual activity: Not Currently    Partners: Male    Comment: declined condoms  Other Topics Concern   Not on file  Social  History Narrative   Not on file   Social Drivers of Health   Financial Resource Strain: Not on file  Food Insecurity: Food Insecurity Present (07/30/2024)   Hunger Vital Sign    Worried About Running Out of Food in the Last Year: Sometimes true    Ran Out of Food in the Last Year: Sometimes true  Transportation Needs: No Transportation Needs (07/30/2024)   PRAPARE - Administrator, Civil Service (Medical): No    Lack of Transportation (Non-Medical): No  Physical Activity: Not on file  Stress: Not on file  Social Connections: Not on file  Intimate Partner Violence: Not At Risk (07/30/2024)   Humiliation, Afraid, Rape, and Kick questionnaire    Fear of Current or Ex-Partner: No    Emotionally Abused: No    Physically Abused: No    Sexually Abused: No     PHYSICAL EXAM  GENERAL EXAM/CONSTITUTIONAL: Vitals:  Vitals:   08/11/24 0853  BP: 126/78  Pulse: 93  Weight: 85 lb 12.8 oz (38.9 kg)  Height: 5' 3 (1.6 m)   Body mass index is 15.2 kg/m. Wt Readings from Last 3 Encounters:  08/11/24 85 lb 12.8 oz (38.9 kg)  07/29/24 86 lb (39 kg)  06/04/24 91 lb (41.3 kg)   Patient is in no distress; well developed, nourished and groomed; neck is supple  CARDIOVASCULAR: Examination of carotid arteries is normal; no carotid bruits Regular rate and rhythm, no murmurs Examination of peripheral vascular system by observation and palpation is normal  EYES: Ophthalmoscopic exam of optic discs and posterior segments is normal; no papilledema or hemorrhages No results found.  MUSCULOSKELETAL: Gait, strength, tone, movements noted in Neurologic  exam below  NEUROLOGIC: MENTAL STATUS:      No data to display         awake, alert, oriented to person, place and time recent and remote memory intact normal attention and concentration language fluent, comprehension intact, naming intact fund of knowledge appropriate  CRANIAL NERVE:  2nd - no papilledema on fundoscopic exam 2nd, 3rd, 4th, 6th - pupils equal and reactive to light, visual fields full to confrontation, extraocular muscles intact, no nystagmus 5th - facial sensation symmetric 7th - facial strength symmetric 8th - hearing intact 9th - palate elevates symmetrically, uvula midline 11th - shoulder shrug symmetric 12th - tongue protrusion midline  MOTOR:  normal bulk and tone, full strength in the BUE, BLE; EXCEPT RIGHT FOOT DROP 2/5  SENSORY:  normal and symmetric to light touch, temperature, vibration  COORDINATION:  finger-nose-finger, fine finger movements normal  REFLEXES:  deep tendon reflexes --> SLIGHTLY BRISK IN RUE AND RIGHT KNEE; ABSENT AT ANKLES  GAIT/STATION:  narrow based gait     DIAGNOSTIC DATA (LABS, IMAGING, TESTING) - I reviewed patient records, labs, notes, testing and imaging myself where available.  Lab Results  Component Value Date   WBC 4.6 07/30/2024   HGB 12.3 07/30/2024   HCT 36.9 07/30/2024   MCV 97.9 07/30/2024   PLT 236 07/30/2024      Component Value Date/Time   NA 139 07/30/2024 0639   NA 140 12/21/2022 1214   K 3.6 07/30/2024 0639   CL 101 07/30/2024 0639   CO2 24 07/30/2024 0639   GLUCOSE 73 07/30/2024 0639   BUN 9 07/30/2024 0639   BUN 9 12/21/2022 1214   CREATININE 0.96 07/30/2024 0639   CREATININE 1.05 12/26/2023 1010   CALCIUM  9.7 07/30/2024 0639   PROT 8.6 (H) 07/29/2024 1148  ALBUMIN 4.2 07/29/2024 1148   AST 25 07/29/2024 1148   ALT 18 07/29/2024 1148   ALKPHOS 55 07/29/2024 1148   BILITOT 0.6 07/29/2024 1148   GFRNONAA >60 07/30/2024 0639   GFRNONAA 56 (L) 03/07/2021 1009   GFRAA 65  03/07/2021 1009   Lab Results  Component Value Date   CHOL 195 09/06/2023   HDL 91 09/06/2023   LDLCALC 86 09/06/2023   TRIG 85 09/06/2023   CHOLHDL 2.1 09/06/2023   Lab Results  Component Value Date   HGBA1C 5.5 12/21/2022   Lab Results  Component Value Date   VITAMINB12 364 10/19/2008   Lab Results  Component Value Date   TSH 2.24 04/25/2023    07/29/24 CTA head / neck -A wide-mouth aneurysm projecting medially from the proximal left cavernous ICA measures 6 x 3 mm (series 15, image 157), and a smaller aneurysm projecting laterally at the same level measures 3 x 2 mm (series 15, image 156). 1. No evidence of acute intracranial abnormality. 2. Moderate atherosclerosis in the head and neck without a large vessel occlusion. 3. Mild bilateral cavernous ICA stenoses. 4. 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 5. Cervical carotid atherosclerosis without significant stenosis. 6. Aortic Atherosclerosis (ICD10-I70.0) and Emphysema (ICD10-J43.9).  07/30/24 MRI brain w/wo Small old lacunar infarcts in both cerebellar hemispheres. No other significant abnormality.   ASSESSMENT AND PLAN  63 y.o. year old female here with:  Dx:  1. Intracranial atherosclerosis   2. Cerebral aneurysm without rupture     PLAN:  intractable nausea / vomiting / headaches (now resolved) - likely related to cannabis emesis syndrome; now resolved  Intracranial atherosclerosis (bilateral ICA stenosis) - continue medical mgmt (BP control, statin, HIV mgmt) - avoid tobacco / cannabis  bilateral cavernous carotid aneurysms (unruptured, incidental finding) - proximal left cavernous ICA measures 6 x 3 mm (series 15, image 157), and a smaller aneurysm projecting laterally at the same level measures 3 x 2 mm (series 15, image 156). - refer to neurosurgery clinic for eval and mgmt - advised to quit smoking cannabis (patient has quit for last 1-2 weeks)  Orders Placed This Encounter   Procedures   Ambulatory referral to Neurosurgery   Return for return to PCP, pending if symptoms worsen or fail to improve.    EDUARD FABIENE HANLON, MD 08/11/2024, 9:30 AM Certified in Neurology, Neurophysiology and Neuroimaging  [facility] [address], [address] [place], [place] [address] [phone]

## 2024-08-11 NOTE — Patient Instructions (Addendum)
   intractable nausea / vomiting / headache (now resolved) - likely related to cannabis emesis syndrome; now resolved  Intracranial atherosclerosis (bilateral ICA stenosis) - continue medical mgmt (BP control, statin, HIV mgmt) - avoid tobacco / cannabis  bilateral cavernous carotid aneurysms (unruptured, incidental finding) - proximal left cavernous ICA measures 6 x 3 mm (series 15, image 157), and a smaller aneurysm projecting laterally at the same level measures 3 x 2 mm (series 15, image 156). - refer to neurosurgery clinic for eval and mgmt - advised to quit smoking cannabis (patient has quit for last 1-2 weeks)

## 2024-08-12 ENCOUNTER — Telehealth: Admitting: Physician Assistant

## 2024-08-12 DIAGNOSIS — T6091XA Toxic effect of unspecified pesticide, accidental (unintentional), initial encounter: Secondary | ICD-10-CM

## 2024-08-12 NOTE — Patient Instructions (Signed)
 [name], thank you for joining [name], PA-C for today's virtual visit.  While this provider is not your primary care provider (PCP), if your PCP is located in our provider database this encounter information will be shared with them immediately following your visit.   A [facility] MyChart account gives you access to today's visit and all your visits, tests, and labs performed at [facility]  click here if you don't have a [facility] MyChart account or go to [email or web address]  Consent: (Patient) Iesha D Gedney provided verbal consent for this virtual visit at the beginning of the encounter.  Current Medications:  Current Outpatient Medications:    albuterol  (VENTOLIN  HFA) 108 (90 Base) MCG/ACT inhaler, Inhale 2 puffs into the lungs every 6 (six) hours as needed for wheezing or shortness of breath., Disp: 18 g, Rfl: 1   amLODipine  (NORVASC ) 10 MG tablet, Take 1 tablet (10 mg total) by mouth daily., Disp: 90 tablet, Rfl: 3   bictegravir-emtricitabine -tenofovir  AF (BIKTARVY ) 50-200-25 MG TABS tablet, Take 1 tablet by mouth daily., Disp: 30 tablet, Rfl: 5   butalbital -acetaminophen -caffeine  (FIORICET ) 50-325-40 MG tablet, Take 1-2 tablets by mouth 2 (two) times daily as needed for headache (intractable headache)., Disp: 14 tablet, Rfl: 0   Ensure (ENSURE), Take 237 mLs by mouth 3 (three) times daily between meals., Disp: 237 mL, Rfl: 5   EPIPEN  2-PAK 0.3 MG/0.3ML SOAJ injection, , Disp: , Rfl:    fluticasone  (FLONASE ) 50 MCG/ACT nasal spray, Use 1 spray(s) in each nostril once daily, Disp: 16 g, Rfl: 0   levocetirizine (XYZAL ) 5 MG tablet, Take 1 tablet (5 mg total) by mouth every evening., Disp: 90 tablet, Rfl: 3   MIEBO 1.338 GM/ML SOLN, , Disp: , Rfl:    mirtazapine  (REMERON ) 15 MG tablet, Take 1 tablet (15 mg total) by mouth at bedtime. (Patient taking differently: Take 15 mg by mouth at bedtime as needed.), Disp: 30 tablet, Rfl: 11   ondansetron   (ZOFRAN -ODT) 4 MG disintegrating tablet, Take 1 tablet (4 mg total) by mouth every 8 (eight) hours as needed for nausea or vomiting., Disp: 20 tablet, Rfl: 0   pantoprazole  (PROTONIX ) 40 MG tablet, Take 1 tablet (40 mg total) by mouth daily., Disp: 30 tablet, Rfl: 0   RESTASIS 0.05 % ophthalmic emulsion, 1 drop 2 (two) times daily., Disp: , Rfl:    rosuvastatin  (CRESTOR ) 40 MG tablet, Take 1 tablet (40 mg total) by mouth daily., Disp: 90 tablet, Rfl: 3   SYMBICORT  160-4.5 MCG/ACT inhaler, Inhale 2 puffs into the lungs 2 (two) times daily., Disp: , Rfl:    Tiotropium Bromide  Monohydrate (SPIRIVA  RESPIMAT) 2.5 MCG/ACT AERS, Inhale 2 puffs into the lungs daily., Disp: 4 g, Rfl: 11   traMADol  (ULTRAM ) 50 MG tablet, TAKE 1 TABLET BY MOUTH EVERY 6 HOURS AS NEEDED., Disp: 60 tablet, Rfl: 3   Medications ordered in this encounter:  No orders of the defined types were placed in this encounter.    *If you need refills on other medications prior to your next appointment, please contact your pharmacy*  Follow-Up: Call back or seek an in-person evaluation if the symptoms worsen or if the condition fails to improve as anticipated.  [facility] [phone]  Other Instructions Please hydrate and rest. Keep inside today where air quality is better. Follow-up tomorrow with Dr. Luiz as scheduled. If you note any non-resolving, new, or worsening symptoms despite treatment, please seek an in-person evaluation ASAP.  If you have been instructed to have an in-person evaluation today at a local Urgent Care facility, please use the link below. It will take you to a list of all of our available [facility], including address, phone number and hours of operation. Please do not delay care.  [facility]  If you or a family member do not have a primary care provider, use the link below to schedule a visit and establish care. When you choose a [facility] primary care  physician or advanced practice provider, you gain a long-term partner in health. Find a Primary Care Provider  Learn more about [facility]'s in-office and virtual care options: [facility] - Get Care Now

## 2024-08-12 NOTE — Progress Notes (Signed)
 Virtual Visit Consent   Janice Williams, you are scheduled for a virtual visit with a [facility] provider today. Just as with appointments in the office, your consent must be obtained to participate. Your consent will be active for this visit and any virtual visit you may have with one of our providers in the next 365 days. If you have a MyChart account, a copy of this consent can be sent to you electronically.  As this is a virtual visit, video technology does not allow for your provider to perform a traditional examination. This may limit your provider's ability to fully assess your condition. If your provider identifies any concerns that need to be evaluated in person or the need to arrange testing (such as labs, EKG, etc.), we will make arrangements to do so. Although advances in technology are sophisticated, we cannot ensure that it will always work on either your end or our end. If the connection with a video visit is poor, the visit may have to be switched to a telephone visit. With either a video or telephone visit, we are not always able to ensure that we have a secure connection.  By engaging in this virtual visit, you consent to the provision of healthcare and authorize for your insurance to be billed (if applicable) for the services provided during this visit. Depending on your insurance coverage, you may receive a charge related to this service.  I need to obtain your verbal consent now. Are you willing to proceed with your visit today? Janice Williams has provided verbal consent on 08/12/2024 for a virtual visit (video or telephone). Janice Williams, [place]  Date: 08/12/2024 11:54 AM   Virtual Visit via Video Note   I, Janice Williams, connected with  Janice Williams  ([id number], 05/23/1961) on 08/12/24 at 11:45 AM EDT by a video-enabled telemedicine application and verified that I am speaking with the correct person using two identifiers.  Location: Patient: Virtual Visit Location  Patient: Home Provider: Virtual Visit Location Provider: Home Office   I discussed the limitations of evaluation and management by telemedicine and the availability of in person appointments. The patient expressed understanding and agreed to proceed.    History of Present Illness: Janice Williams is a 63 y.o. who identifies as a female who was assigned female at birth, and is being seen today for concerns. Notes she recently got out of the hospital having to have a lumbar puncture for some ongoing symptoms. Notes she was outside yesterday when a neighbor was spraying some pesticides. Notes she caught wind of it which made her cough slightly. Removed herself from the area. Notes today has been anxious about the exposure, making her jittery. Otherwise is feeling fine. Wants to make sure nothing she needs to do prior to her follow-up tomorrow with her ID provider.  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   Pulmonary emphysema (HCC) 07/30/2024   Intractable nausea and vomiting 07/29/2024   Allergic rhinitis due to animal hair and dander 07/11/2024   Mild persistent asthma, uncomplicated 07/11/2024   Other chronic allergic conjunctivitis 07/11/2024   Other allergic rhinitis 07/11/2024   Vitamin D  deficiency 08/08/2023   Rash of hand 12/21/2022   Former cigarette smoker 11/16/2022   Sprain of calcaneofibular ligament 02/28/2022   Degenerative disc  disease, cervical 10/13/2020   Osteoporosis 10/10/2019   COPD  GOLD 3  07/08/2019   Moderate protein-calorie malnutrition (HCC) 05/31/2018   Hypertension 06/08/2017   Hyperlipidemia 06/03/2009   Human immunodeficiency virus (HIV) disease (HCC) 10/29/2008   GERD 10/29/2008   CEREBROVASCULAR ACCIDENT, HX OF 10/29/2008   Symptoms concerning nutrition, metabolism, and  development 02/14/2007   Rhinitis, chronic 02/14/2007    Allergies:  Allergies  Allergen Reactions   Varenicline  Itching and Other (See Comments)   Medications:  Current Outpatient Medications:    albuterol  (VENTOLIN  HFA) 108 (90 Base) MCG/ACT inhaler, Inhale 2 puffs into the lungs every 6 (six) hours as needed for wheezing or shortness of breath., Disp: 18 g, Rfl: 1   amLODipine  (NORVASC ) 10 MG tablet, Take 1 tablet (10 mg total) by mouth daily., Disp: 90 tablet, Rfl: 3   bictegravir-emtricitabine -tenofovir  AF (BIKTARVY ) 50-200-25 MG TABS tablet, Take 1 tablet by mouth daily., Disp: 30 tablet, Rfl: 5   Ensure (ENSURE), Take 237 mLs by mouth 3 (three) times daily between meals., Disp: 237 mL, Rfl: 5   EPIPEN  2-PAK 0.3 MG/0.3ML SOAJ injection, , Disp: , Rfl:    fluticasone  (FLONASE ) 50 MCG/ACT nasal spray, Use 1 spray(s) in each nostril once daily, Disp: 16 g, Rfl: 0   levocetirizine (XYZAL ) 5 MG tablet, Take 1 tablet (5 mg total) by mouth every evening., Disp: 90 tablet, Rfl: 3   MIEBO 1.338 GM/ML SOLN, , Disp: , Rfl:    mirtazapine  (REMERON ) 15 MG tablet, Take 1 tablet (15 mg total) by mouth at bedtime. (Patient taking differently: Take 15 mg by mouth at bedtime as needed.), Disp: 30 tablet, Rfl: 11   pantoprazole  (PROTONIX ) 40 MG tablet, Take 1 tablet (40 mg total) by mouth daily., Disp: 30 tablet, Rfl: 0   RESTASIS 0.05 % ophthalmic emulsion, 1 drop 2 (two) times daily., Disp: , Rfl:    rosuvastatin  (CRESTOR ) 40 MG tablet, Take 1 tablet (40 mg total) by mouth daily., Disp: 90 tablet, Rfl: 3   SYMBICORT  160-4.5 MCG/ACT inhaler, Inhale 2 puffs into the lungs 2 (two) times daily., Disp: , Rfl:    Tiotropium Bromide  Monohydrate (SPIRIVA  RESPIMAT) 2.5 MCG/ACT AERS, Inhale 2 puffs into the lungs daily., Disp: 4 g, Rfl: 11   traMADol  (ULTRAM ) 50 MG tablet, TAKE 1 TABLET BY MOUTH EVERY 6 HOURS AS NEEDED., Disp: 60 tablet, Rfl: 3  Observations/Objective: Patient is well-developed, well-nourished  in no acute distress.  Resting comfortably  at home.  Head is normocephalic, atraumatic.  No labored breathing.  Speech is clear and coherent with logical content.  Patient is alert and oriented at baseline.    Assessment and Plan: 1. Toxic effect of pesticide, unintentional, initial encounter (Primary)  Minimal exposure as she was not near the individual using pesticides, but some of the scent was carried on the wind to her, causing irritation and coughing for a few moments, so mild exposure. She is anxious about recent hospitalization and worried this exposure would impact her health longer terms. Discussed minimal exposure and no respiratory of neurological symptoms. Suspect mild jitteriness is directly related to her anxiety so tried to reassure her. Plan for follow-up in person tomorrow as scheduled with Dr. Luiz. UC/ER precautions reviewed if she notes any shortness of breath, severe coughing, etc, returning, although this is unlikely.   Follow Up Instructions: I discussed the assessment and treatment plan with the patient. The patient was provided an opportunity to ask questions and all were answered. The patient agreed  with the plan and demonstrated an understanding of the instructions.  A copy of instructions were sent to the patient via MyChart unless otherwise noted below.   The patient was advised to call back or seek an in-person evaluation if the symptoms worsen or if the condition fails to improve as anticipated.    Janice Velma Lunger, PA-C

## 2024-08-13 ENCOUNTER — Ambulatory Visit: Admitting: Internal Medicine

## 2024-08-13 ENCOUNTER — Other Ambulatory Visit: Payer: Self-pay

## 2024-08-13 ENCOUNTER — Encounter: Payer: Self-pay | Admitting: Internal Medicine

## 2024-08-13 ENCOUNTER — Ambulatory Visit

## 2024-08-13 ENCOUNTER — Other Ambulatory Visit: Payer: Self-pay | Admitting: Internal Medicine

## 2024-08-13 VITALS — BP 115/80 | HR 89 | Temp 97.6°F | Ht 63.0 in | Wt 86.0 lb

## 2024-08-13 DIAGNOSIS — R1110 Vomiting, unspecified: Secondary | ICD-10-CM

## 2024-08-13 DIAGNOSIS — J449 Chronic obstructive pulmonary disease, unspecified: Secondary | ICD-10-CM

## 2024-08-13 DIAGNOSIS — M1990 Unspecified osteoarthritis, unspecified site: Secondary | ICD-10-CM

## 2024-08-13 DIAGNOSIS — T6091XA Toxic effect of unspecified pesticide, accidental (unintentional), initial encounter: Secondary | ICD-10-CM | POA: Diagnosis not present

## 2024-08-13 DIAGNOSIS — B20 Human immunodeficiency virus [HIV] disease: Secondary | ICD-10-CM | POA: Diagnosis present

## 2024-08-13 DIAGNOSIS — R630 Anorexia: Secondary | ICD-10-CM

## 2024-08-13 DIAGNOSIS — K219 Gastro-esophageal reflux disease without esophagitis: Secondary | ICD-10-CM | POA: Diagnosis not present

## 2024-08-13 DIAGNOSIS — R634 Abnormal weight loss: Secondary | ICD-10-CM

## 2024-08-13 DIAGNOSIS — R110 Nausea: Secondary | ICD-10-CM

## 2024-08-13 DIAGNOSIS — M25569 Pain in unspecified knee: Secondary | ICD-10-CM

## 2024-08-13 MED ORDER — TRAMADOL HCL 50 MG PO TABS: 50.0000 mg | ORAL_TABLET | Freq: Four times a day (QID) | ORAL | 3 refills | Status: DC | PRN

## 2024-08-13 MED ORDER — PROMETHAZINE HCL 12.5 MG PO TABS: 12.5000 mg | ORAL_TABLET | Freq: Four times a day (QID) | ORAL | 0 refills | Status: AC | PRN

## 2024-08-13 MED ORDER — PANTOPRAZOLE SODIUM 40 MG PO TBEC: 40.0000 mg | DELAYED_RELEASE_TABLET | Freq: Every day | ORAL | 0 refills | Status: DC

## 2024-08-13 MED ORDER — PROMETHAZINE HCL 12.5 MG PO TABS: 12.5000 mg | ORAL_TABLET | Freq: Four times a day (QID) | ORAL | 0 refills | Status: DC | PRN

## 2024-08-13 MED ORDER — MIRTAZAPINE 15 MG PO TABS: 15.0000 mg | ORAL_TABLET | Freq: Every day | ORAL | 11 refills | Status: DC

## 2024-08-13 NOTE — Progress Notes (Signed)
 Patient ID: Janice Williams, female   DOB: 08-20-1961, 63 y.o.   MRN: [id number]  HPI Janice Williams is a 63 year old female with HIV who was recently hospitalized for intractable nausea and vomiting with headache thought to be associated with recent marijuana use that was contaminated with pesticide exposure.  She has experienced a significant loss of appetite, which she attributes to the discontinuation of a megace  that previously helped her. To stimulate her appetite, she has been using marijuana, but a recent incident involving marijuana laced with pesticide led to hospitalization. She reports that yesterday that she was exposed to a neighbor's pesticide spray, which exacerbated her symptoms, making her feel jittery and unwell. She has been trying to avoid exposure to sprays and has been using humidifiers and drinking water to alleviate symptoms.  In terms of being underweight- She has been consuming Ensure three times a day to maintain nutrition and reports a weight loss of ten pounds, now weighing 83 pounds. She has been feeling weak and is trying to regain her energy.  She mentions a history of two small intracranial aneurysms discovered during her hospitalization, which are reportedly shrinking. She is scheduled to see a neurosurgeon in September.  She expresses feelings of loneliness and depression, particularly after the loss of her parents and a strained relationship with her siblings. Her son, who works in [place] , visits occasionally, and her granddaughter is staying with her to provide support.   Outpatient Encounter Medications as of 08/13/2024  Medication Sig   albuterol  (VENTOLIN  HFA) 108 (90 Base) MCG/ACT inhaler Inhale 2 puffs into the lungs every 6 (six) hours as needed for wheezing or shortness of breath.   amLODipine  (NORVASC ) 10 MG tablet Take 1 tablet (10 mg total) by mouth daily.   bictegravir-emtricitabine -tenofovir  AF (BIKTARVY ) 50-200-25 MG TABS tablet Take 1 tablet by  mouth daily.   Ensure (ENSURE) Take 237 mLs by mouth 3 (three) times daily between meals.   EPIPEN  2-PAK 0.3 MG/0.3ML SOAJ injection    fluticasone  (FLONASE ) 50 MCG/ACT nasal spray Use 1 spray(s) in each nostril once daily   levocetirizine (XYZAL ) 5 MG tablet Take 1 tablet (5 mg total) by mouth every evening.   MIEBO 1.338 GM/ML SOLN    mirtazapine  (REMERON ) 15 MG tablet Take 1 tablet (15 mg total) by mouth at bedtime. (Patient taking differently: Take 15 mg by mouth at bedtime as needed.)   pantoprazole  (PROTONIX ) 40 MG tablet Take 1 tablet (40 mg total) by mouth daily.   RESTASIS 0.05 % ophthalmic emulsion 1 drop 2 (two) times daily.   rosuvastatin  (CRESTOR ) 40 MG tablet Take 1 tablet (40 mg total) by mouth daily.   SYMBICORT  160-4.5 MCG/ACT inhaler Inhale 2 puffs into the lungs 2 (two) times daily.   Tiotropium Bromide  Monohydrate (SPIRIVA  RESPIMAT) 2.5 MCG/ACT AERS Inhale 2 puffs into the lungs daily.   traMADol  (ULTRAM ) 50 MG tablet TAKE 1 TABLET BY MOUTH EVERY 6 HOURS AS NEEDED.   [DISCONTINUED] hydrocortisone  (ANUSOL -HC) 25 MG suppository Place 1 suppository (25 mg total) rectally 2 (two) times daily. (Patient not taking: Reported on 07/29/2024)   [DISCONTINUED] mupirocin ointment (BACTROBAN) 2 %  (Patient not taking: Reported on 07/29/2024)   [DISCONTINUED] Olopatadine  HCl 0.2 % SOLN 1 drop into affected eye Ophthalmic Once a day (Patient not taking: Reported on 07/29/2024)   [DISCONTINUED] omeprazole  (PRILOSEC) 20 MG capsule Take 1 capsule (20 mg total) by mouth daily.   [DISCONTINUED] ondansetron  (ZOFRAN ) 4 MG tablet Take 1  tablet (4 mg total) by mouth every 8 (eight) hours as needed for nausea or vomiting.   [DISCONTINUED] ondansetron  (ZOFRAN -ODT) 8 MG disintegrating tablet Take 1 tablet (8 mg total) by mouth every 8 (eight) hours as needed for nausea.   [DISCONTINUED] prednisoLONE acetate (PRED FORTE) 1 % ophthalmic suspension 1 drop 4 (four) times daily.   [DISCONTINUED] PRESCRIPTION  MEDICATION Inject 1 Dose into the skin once a week. Allergy  shot in office on Fridays   [DISCONTINUED] Spacer/Aero-Holding Chambers (AEROCHAMBER MV) inhaler as directed for 30 days   [DISCONTINUED] TYRVAYA  0.03 MG/ACT SOLN  (Patient not taking: Reported on 07/29/2024)   No facility-administered encounter medications on file as of 08/13/2024.     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   Pulmonary emphysema (HCC) 07/30/2024   Intractable nausea and vomiting 07/29/2024   Allergic rhinitis due to animal hair and dander 07/11/2024   Mild persistent asthma, uncomplicated 07/11/2024   Other chronic allergic conjunctivitis 07/11/2024   Other allergic rhinitis 07/11/2024   Vitamin D  deficiency 08/08/2023   Rash of hand 12/21/2022   Former cigarette smoker 11/16/2022   Sprain of calcaneofibular ligament 02/28/2022   Degenerative disc disease, cervical 10/13/2020   Osteoporosis 10/10/2019   COPD  GOLD 3  07/08/2019   Moderate protein-calorie malnutrition (HCC) 05/31/2018   Hypertension 06/08/2017   Hyperlipidemia 06/03/2009   Human immunodeficiency virus (HIV) disease (HCC) 10/29/2008   GERD 10/29/2008   CEREBROVASCULAR ACCIDENT, HX OF 10/29/2008   Symptoms concerning nutrition, metabolism, and development 02/14/2007   Rhinitis, chronic 02/14/2007     Health Maintenance Due  Topic Date Due   Zoster Vaccines- Shingrix (1 of 2) Never done   COVID-19 Vaccine (6 - Moderna risk 2024-25 season) 04/24/2024   Lung Cancer Screening  06/26/2024   INFLUENZA VACCINE  07/18/2024     Review of Systems 12 point ros is negative except what is mentioned above Physical Exam   BP 115/80   Pulse 89   Temp 97.6 F (36.4 C) (Temporal)   Ht 5' 3 (1.6 m)   Wt 86 lb (39 kg)   SpO2 97%   BMI 15.23 kg/m   Physical Exam  Constitutional:   oriented to person, place, and time. appears well-developed and well-nourished. No distress.  HENT: [place]/AT, PERRLA, no scleral icterus Mouth/Throat: Oropharynx is clear and moist. No oropharyngeal exudate.  Cardiovascular: Normal rate, regular rhythm and normal heart sounds. Exam reveals no gallop and no friction rub.  No murmur heard.  Pulmonary/Chest: Effort normal and breath sounds normal. No respiratory distress.  has no wheezes.  Neck = supple, no nuchal rigidity Abdominal: Soft. Bowel sounds are normal.  exhibits no distension. There is no tenderness.  Lymphadenopathy: no cervical adenopathy. No axillary adenopathy Neurological: alert and oriented to person, place, and time.  Skin: Skin is warm and dry. No rash noted. No erythema.  Psychiatric: a normal mood and affect.  behavior is normal.   Lab Results  Component Value Date   CD4TCELL 33 07/30/2024   Lab Results  Component Value Date   CD4TABS 509 07/30/2024   CD4TABS 880 09/06/2023   CD4TABS 674 01/15/2023   Lab Results  Component Value Date   HIV1RNAQUANT 50 07/30/2024   Lab Results  Component Value Date   HEPBSAB NEG 12/28/2014   Lab Results  Component Value Date  LABRPR NON-REACTIVE 12/26/2023    CBC Lab Results  Component Value Date   WBC 4.6 07/30/2024   RBC 3.77 (L) 07/30/2024   HGB 12.3 07/30/2024   HCT 36.9 07/30/2024   PLT 236 07/30/2024   MCV 97.9 07/30/2024   MCH 32.6 07/30/2024   MCHC 33.3 07/30/2024   RDW 14.6 07/30/2024   LYMPHSABS 2,314 09/06/2023   MONOABS 0.6 06/28/2022   EOSABS 20 12/26/2023    BMET Lab Results  Component Value Date   NA 139 07/30/2024   K 3.6 07/30/2024   CL 101 07/30/2024   CO2 24 07/30/2024   GLUCOSE 73 07/30/2024   BUN 9 07/30/2024   CREATININE 0.96 07/30/2024   CALCIUM  9.7 07/30/2024   GFRNONAA >60 07/30/2024   GFRAA 65 03/07/2021      Assessment and Plan   Human immunodeficiency virus (HIV) infection HIV is well controlled with current  antiretroviral therapy. No issues with adherence to medication regimen. - Continue current HIV medications as prescribed.  Toxic effect of pesticide exposure Recent exposure to pesticide from marijuana and environmental sources. Symptoms include jitteriness and sensation of pesticide coming out of pores. - Avoid exposure to pesticides and other environmental irritants. - Continue using humidifiers and air conditioning to aid in detoxification. - Encourage frequent bathing and washing of clothes to remove residual pesticide.  Appetite loss and weight loss Significant appetite loss and weight loss, with a reported loss of 10 pounds, now weighing 83 pounds. Previous use of marijuana for appetite stimulation led to pesticide exposure. Marinol  was ineffective due to adverse effects. Remeron  is proposed as an alternative to stimulate appetite and aid sleep. - Prescribe Remeron  15mg  to be taken at night to stimulate appetite and aid sleep. - Encourage consumption of high-calorie foods and Ensure nutritional drinks.  Acid Reflux - refill pantoprazole   Nausea and vomiting Nausea and vomiting likely related to pesticide exposure and previous marijuana use. Symptoms have improved since hospitalization. - will give prescription for phenergan  to see if helps her symptoms  Chronic obstructive pulmonary disease (COPD) COPD with increased sensitivity to environmental irritants such as pesticides and air fresheners. - Avoid exposure to environmental irritants such as pesticides and air fresheners.  Intracranial aneurysms Two small intracranial aneurysms identified, likely related to pesticide exposure. Currently under observation and shrinking. - Attend neurosurgery appointment on September 3rd for further evaluation of intracranial aneurysms.  Knee pain Knee pain reported, possibly exacerbated by environmental factors and weight loss. - Refill tramadol  prescription for knee pain management.

## 2024-08-19 ENCOUNTER — Other Ambulatory Visit: Payer: Self-pay

## 2024-08-19 DIAGNOSIS — J301 Allergic rhinitis due to pollen: Secondary | ICD-10-CM

## 2024-08-19 MED ORDER — FLUTICASONE PROPIONATE 50 MCG/ACT NA SUSP: NASAL | 0 refills | Status: DC

## 2024-08-20 ENCOUNTER — Ambulatory Visit: Admitting: Neuroradiology

## 2024-08-20 ENCOUNTER — Encounter: Payer: Self-pay | Admitting: Neuroradiology

## 2024-08-20 VITALS — BP 131/89 | HR 93 | Ht 63.0 in | Wt 87.0 lb

## 2024-08-20 DIAGNOSIS — I671 Cerebral aneurysm, nonruptured: Secondary | ICD-10-CM

## 2024-08-20 NOTE — Progress Notes (Signed)
 I had the pleasure of seeing [name] in the office today for discussion of a incidentally detected brain aneurysm.  She was seen in the hospital for persistent dizziness with nausea and vomiting which prompted neurologic imaging.  This included a CT of the head and CT arteriogram of the head and neck as well as a brain MRI.  I have reviewed those studies.  Briefly, the CTA shows a cavernous aneurysm measuring about 6 mm on the left side.  There is no intradural aneurysm.  Her brain MRI shows a few old lacunar infarcts in the cerebellum, but no acute abnormalities.  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  It does not require any surgical treatment or imaging follow-up.  I explained the above to the patient.  I have not scheduled any routine follow-up but I am glad to see her again as needed.

## 2024-08-28 ENCOUNTER — Other Ambulatory Visit: Payer: Self-pay

## 2024-08-28 ENCOUNTER — Other Ambulatory Visit: Payer: Self-pay | Admitting: Internal Medicine

## 2024-08-28 DIAGNOSIS — B20 Human immunodeficiency virus [HIV] disease: Secondary | ICD-10-CM

## 2024-08-28 MED ORDER — BIKTARVY 50-200-25 MG PO TABS
1.0000 | ORAL_TABLET | Freq: Every day | ORAL | 2 refills | Status: AC
Start: 2024-08-28 — End: ?
  Filled 2024-08-28 – 2024-09-03 (×2): qty 30, 30d supply, fill #0
  Filled 2024-10-02: qty 30, 30d supply, fill #1
  Filled 2024-10-23 – 2024-10-29 (×2): qty 30, 30d supply, fill #2

## 2024-09-01 ENCOUNTER — Other Ambulatory Visit: Payer: Self-pay

## 2024-09-03 ENCOUNTER — Ambulatory Visit

## 2024-09-03 ENCOUNTER — Other Ambulatory Visit: Payer: Self-pay

## 2024-09-03 ENCOUNTER — Other Ambulatory Visit (HOSPITAL_COMMUNITY): Payer: Self-pay

## 2024-09-03 VITALS — BP 129/85 | HR 91 | Temp 98.1°F | Resp 16 | Ht 63.0 in | Wt 82.8 lb

## 2024-09-03 DIAGNOSIS — J302 Other seasonal allergic rhinitis: Secondary | ICD-10-CM | POA: Diagnosis not present

## 2024-09-03 DIAGNOSIS — I10 Essential (primary) hypertension: Secondary | ICD-10-CM | POA: Diagnosis not present

## 2024-09-03 DIAGNOSIS — Z20822 Contact with and (suspected) exposure to covid-19: Secondary | ICD-10-CM

## 2024-09-03 DIAGNOSIS — Z7689 Persons encountering health services in other specified circumstances: Secondary | ICD-10-CM

## 2024-09-03 LAB — POC SOFIA 2 FLU + SARS ANTIGEN FIA
Influenza A, POC: NEGATIVE
Influenza B, POC: NEGATIVE
SARS Coronavirus 2 Ag: NEGATIVE

## 2024-09-03 MED ORDER — FLUTICASONE PROPIONATE 50 MCG/ACT NA SUSP: 2.0000 | Freq: Every day | NASAL | 6 refills | Status: DC

## 2024-09-03 NOTE — Progress Notes (Signed)
 Patient ID: Janice Williams, female    DOB: 02-Mar-1961  MRN: [id number]  CC: Establish Care (Covid trest due to exposure /Patient is here to established care with provider./~health hx address/~care gaps address/ )   Subjective: Janice Williams is a 63 y.o. female with past medical history of HIV, hypertension, who presents to clinic to establish care.  Patient is doing better today when compared to a month prior reports she had about a month of constant vomiting and losing weight.  Reports that her appetite is improving as she is trying to gain weight again.  Reports no acute concerns at this time.  Reports she is taking all medications as prescribed.  Patient reports she had exposure to a person with COVID this past Sunday and would like to be tested.  Denies symptoms of fever, cough, runny nose, or sore throat.   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   Pulmonary emphysema (HCC) 07/30/2024   Intractable nausea and vomiting 07/29/2024   Allergic rhinitis due to animal hair and dander 07/11/2024   Mild persistent asthma, uncomplicated 07/11/2024   Other chronic allergic conjunctivitis 07/11/2024   Other allergic rhinitis 07/11/2024   Vitamin D  deficiency 08/08/2023   Rash of hand 12/21/2022   Former cigarette smoker 11/16/2022   Sprain of calcaneofibular ligament 02/28/2022   Degenerative disc disease, cervical 10/13/2020   Osteoporosis 10/10/2019   COPD  GOLD 3  07/08/2019   Moderate protein-calorie malnutrition (HCC) 05/31/2018   Hypertension 06/08/2017   Hyperlipidemia 06/03/2009   Human immunodeficiency virus (HIV) disease (HCC) 10/29/2008    GERD 10/29/2008   CEREBROVASCULAR ACCIDENT, HX OF 10/29/2008   Symptoms concerning nutrition, metabolism, and development 02/14/2007   Rhinitis, chronic 02/14/2007     Current Outpatient Medications on File Prior to Visit  Medication Sig Dispense Refill   albuterol  (VENTOLIN  HFA) 108 (90 Base) MCG/ACT inhaler Inhale 2 puffs into the lungs every 6 (six) hours as needed for wheezing or shortness of breath. 18 g 1   amLODipine  (NORVASC ) 10 MG tablet Take 1 tablet (10 mg total) by mouth daily. 90 tablet 3   bictegravir-emtricitabine -tenofovir  AF (BIKTARVY ) 50-200-25 MG TABS tablet Take 1 tablet by mouth daily. 30 tablet 2   Ensure (ENSURE) Take 237 mLs by mouth 3 (three) times daily between meals. 237 mL 5   EPIPEN  2-PAK 0.3 MG/0.3ML SOAJ injection      levocetirizine (XYZAL ) 5 MG tablet Take 1 tablet (5 mg total) by mouth every evening. 90 tablet 3   MIEBO 1.338 GM/ML SOLN      mirtazapine  (REMERON ) 15 MG tablet Take 1 tablet (15 mg total) by mouth at bedtime. 30 tablet 11   pantoprazole  (PROTONIX ) 40 MG tablet Take 1 tablet (40 mg total) by mouth daily. 30 tablet 0   promethazine  (PHENERGAN ) 12.5 MG tablet Take 1 tablet (12.5 mg total) by mouth every 6 (six) hours as needed for nausea or vomiting. 30 tablet 0   RESTASIS 0.05 % ophthalmic emulsion 1 drop 2 (two) times daily.     rosuvastatin  (CRESTOR ) 40 MG tablet Take 1 tablet (40 mg total) by mouth daily.  90 tablet 3   SYMBICORT  160-4.5 MCG/ACT inhaler Inhale 2 puffs into the lungs 2 (two) times daily.     Tiotropium Bromide  Monohydrate (SPIRIVA  RESPIMAT) 2.5 MCG/ACT AERS Inhale 2 puffs into the lungs daily. 4 g 11   traMADol  (ULTRAM ) 50 MG tablet Take 1 tablet (50 mg total) by mouth every 6 (six) hours as needed. 60 tablet 3   TRYPTYR 0.003 % SOLN Apply 1 drop to eye 2 (two) times daily.     No current facility-administered medications on file prior to visit.    Allergies  Allergen Reactions   Varenicline  Itching, Other (See Comments)  and Dermatitis    Social History   Socioeconomic History   Marital status: Single    Spouse name: Not on file   Number of children: 3   Years of education: Not on file   Highest education level: Not on file  Occupational History   Not on file  Tobacco Use   Smoking status: Former    Current packs/day: 0.00    Average packs/day: 2.0 packs/day for 38.0 years (76.1 ttl pk-yrs)    Types: Cigarettes    Start date: 12/18/2012    Quit date: 11/21/2013    Years since quitting: 10.7    Passive exposure: Past   Smokeless tobacco: Never  Vaping Use   Vaping status: Never Used  Substance and Sexual Activity   Alcohol use: Not Currently   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   Sexual activity: Not Currently    Partners: Male    Comment: declined condoms  Other Topics Concern   Not on file  Social History Narrative   Not on file   Social Drivers of Health   Financial Resource Strain: Low Risk  (09/03/2024)   Overall Financial Resource Strain (CARDIA)    Difficulty of Paying Living Expenses: Not very hard  Food Insecurity: Food Insecurity Present (07/30/2024)   Hunger Vital Sign    Worried About Running Out of Food in the Last Year: Sometimes true    Ran Out of Food in the Last Year: Sometimes true  Transportation Needs: No Transportation Needs (07/30/2024)   PRAPARE - Administrator, Civil Service (Medical): No    Lack of Transportation (Non-Medical): No  Physical Activity: Inactive (09/03/2024)   Exercise Vital Sign    Days of Exercise per Week: 0 days    Minutes of Exercise per Session: 0 min  Stress: No Stress Concern Present (09/03/2024)   [employer] of Occupational Health - Occupational Stress Questionnaire    Feeling of Stress: Not at all  Social Connections: Moderately Integrated (09/03/2024)   Social Connection and Isolation Panel    Frequency of Communication with Friends and Family: Twice a week    Frequency of Social Gatherings with Friends  and Family: Twice a week    Attends Religious Services: 1 to 4 times per year    Active Member of [employer] or Organizations: No    Attends Banker Meetings: 1 to 4 times per year    Marital Status: Never married  Intimate Partner Violence: Not At Risk (07/30/2024)   Humiliation, Afraid, Rape, and Kick questionnaire    Fear of Current or Ex-Partner: No    Emotionally Abused: No    Physically Abused: No    Sexually Abused: No    Family History  Problem Relation Age of Onset   Hypertension Mother    Hypertension Father  Hyperlipidemia Father    COPD Father    Atrial fibrillation Father    Colon cancer Neg Hx    Rectal cancer Neg Hx    Stomach cancer Neg Hx    Colon polyps Neg Hx    Esophageal cancer Neg Hx    BRCA 1/2 Neg Hx    Breast cancer Neg Hx     Past Surgical History:  Procedure Laterality Date   COLONOSCOPY  2019   KN-MAC-suprep(adeq)-tics/hems/TA x 5   MULTIPLE TOOTH EXTRACTIONS     with sedation   POLYPECTOMY  01/2008   TA x 5   TONSILLECTOMY  1973   TUBAL LIGATION     UPPER GI ENDOSCOPY     normal per patient - years ago    ROS: Review of Systems Negative except as stated above  PHYSICAL EXAM: BP 129/85   Pulse 91   Temp 98.1 F (36.7 C) (Oral)   Resp 16   Ht 5' 3 (1.6 m)   Wt 82 lb 12.8 oz (37.6 kg)   SpO2 97%   BMI 14.67 kg/m   Physical Exam  General: well-appearing, no acute distress Skin: no jaundice, rashes, or lesions Cardiovascular: regular heart rate and rhythm, normal S1/S2, no murmurs, gallops, or rubs, peripheral pulses 2+ bilaterally Chest: no skeletal deformity, lungs clear to auscultation bilaterally, equal breath sounds bilaterall Musculoskeletal:  normal gait Extremities: no peripheral edema   ASSESSMENT AND PLAN:  1. Encounter to establish care with new provider (Primary) - Plan to follow up in a week to discuss medications for COPD.   2. Primary hypertension  BP: 129/85, at goal - Continue Amlodipine   10mg  tablet daily.   3. Exposure to COVID-19 virus - POC SOFIA 2 FLU + SARS ANTIGEN FIA- Negative test results discussed with patient.   4. Seasonal allergies - fluticasone  (FLONASE ) 50 MCG/ACT nasal spray; Place 2 sprays into both nostrils daily.  Dispense: 16 g; Refill: 6     Patient was given the opportunity to ask questions.  Patient verbalized understanding of the plan and was able to repeat key elements of the plan.    Orders Placed This Encounter  Procedures   POC SOFIA 2 FLU + SARS ANTIGEN FIA     Requested Prescriptions   Signed Prescriptions Disp Refills   fluticasone  (FLONASE ) 50 MCG/ACT nasal spray 16 g 6    Sig: Place 2 sprays into both nostrils daily.    Return in 9 days (on 09/12/2024) for follow-up on COPD.  Sula Leavy Rode, PA-C

## 2024-09-05 ENCOUNTER — Other Ambulatory Visit: Payer: Self-pay | Admitting: Pharmacy Technician

## 2024-09-05 ENCOUNTER — Other Ambulatory Visit: Payer: Self-pay

## 2024-09-05 NOTE — Progress Notes (Signed)
 Specialty Pharmacy Refill Coordination Note  Janice Williams is a 63 y.o. female contacted today regarding refills of specialty medication(s) Bictegravir-Emtricitab-Tenofov (Biktarvy )   Patient requested Delivery   Delivery date: 09/10/24   Verified address: [address] [place]   Medication will be filled on 09/09/24.

## 2024-09-05 NOTE — Progress Notes (Signed)
 Clinical Intervention Note  Clinical Intervention Notes: Patient reported starting promethazine  for nausea. No DDIs identified with Biktarvy . Per OV on 8/27, Dr. Glynis is aware of patient using previously reported pantoprazole  and monitoring.   Clinical Intervention Outcomes: Prevention of an adverse drug event   Advertising account planner

## 2024-09-09 ENCOUNTER — Other Ambulatory Visit: Payer: Medicaid Other

## 2024-09-11 ENCOUNTER — Other Ambulatory Visit: Payer: Self-pay | Admitting: Oral Surgery

## 2024-09-11 DIAGNOSIS — M26633 Articular disc disorder of bilateral temporomandibular joint: Secondary | ICD-10-CM

## 2024-09-11 NOTE — Progress Notes (Unsigned)
   Reconsult Self-referred  Date: September 11, 2024   Patient: Janice Williams  PID: [id number]  DOB: 1961-08-14  SEX: Female    CC: Painful clicking and popping of right and left TMJ. Hurts to open and close.   HPI: Had TMJ Arthrocentesis 05/23/2022 and was doing well until 3-4 months ago. Pain is back. NO locking, injury. Wears dentures.   Past Medical History:  High Blood Pressure, COPD, Sinus Issues, HIV, Osteoporosis, Hay Fever or Sinus Problems, Bronchitis, Arthritis, Acid Reflux, Pain and Clicking of Jaw, Rhinitis    Medications: Amlodipine , Pravastatin , Omeprazole , Allergy  medicine, Biktarvy , Tramadol , Levocetirizine, Symbicort , Spiriva , Rosuvastatin     Allergies:     Chantix         Social History       Smoking:            Alcohol: Drug use:                             Exam: BMI 18. Edentulous maxilla/mandible. Dentures present. Tender to palpation R and L TMJ. MIO 56 mm with good range of motion. Bilateral crepitus on opening and closing.   Oral cancer screening negative. Pharynx clear. No lymphadenopathy.  Panorex:N/A from 2022  Assessment: ASA 3. TMJ Arthralgia bilateral          Plan: TMJ MRI           Rx:Meloxicam              Risks and complications explained. Questions answered.   Janice Williams, DMD  [phone] Arthralgia of bilateral temporomandibular joint

## 2024-09-12 ENCOUNTER — Other Ambulatory Visit: Payer: Self-pay

## 2024-09-12 ENCOUNTER — Ambulatory Visit

## 2024-09-12 VITALS — BP 126/76 | HR 85 | Temp 97.6°F | Resp 14 | Ht 63.0 in | Wt 85.8 lb

## 2024-09-12 DIAGNOSIS — Z23 Encounter for immunization: Secondary | ICD-10-CM

## 2024-09-12 DIAGNOSIS — J449 Chronic obstructive pulmonary disease, unspecified: Secondary | ICD-10-CM

## 2024-09-12 LAB — ACID FAST CULTURE WITH REFLEXED SENSITIVITIES (MYCOBACTERIA): Acid Fast Culture: NEGATIVE

## 2024-09-12 MED ORDER — TIOTROPIUM BROMIDE-OLODATEROL 2.5-2.5 MCG/ACT IN AERS: 2.0000 | INHALATION_SPRAY | Freq: Every day | RESPIRATORY_TRACT | 5 refills | Status: AC

## 2024-09-12 NOTE — Progress Notes (Signed)
     Patient ID: Janice Williams, female    DOB: 1961/04/11  MRN: [id number]  CC: Follow-up   Subjective: Janice Williams is a 63 y.o. female with past medical history of COPD who presents to clinic to discuss COPD symptom management. Pt reports she experiences wheezing every other day. Uses both Spiriva  and Symbicort  daily. Seldomly uses Albuterol  inhaler. Pt states that she feels like current medication regimen is not working in decreasing symptoms.    ROS: Review of Systems Negative except as stated above  PHYSICAL EXAM: BP 126/76 (BP Location: Right Arm, Patient Position: Sitting)   Pulse 85   Temp 97.6 F (36.4 C) (Oral)   Resp 14   Ht 5' 3 (1.6 m)   Wt 85 lb 12.8 oz (38.9 kg)   SpO2 96%   BMI 15.20 kg/m   Physical Exam  General: well-appearing, no acute distress Skin: no jaundice, rashes, or lesions Cardiovascular: regular heart rate and rhythm, normal S1/S2, no murmurs, gallops, or rubs, peripheral pulses 2+ bilaterally Chest: no skeletal deformity, lungs clear to auscultation bilaterally, equal breath sounds bilaterally Musculoskeletal: normal gait Extremities: no peripheral edema    ASSESSMENT AND PLAN:  1. Chronic obstructive pulmonary disease, unspecified COPD type (HCC) (Primary) - Discontinue Spiriva  and Symbicort  inhalers. Per Global Initiative for Chronic Obstructive Lung Disease (GOLD) recommendations switching pt to LABA-LAMA combination inhaler. - Plan to follow up in a month to assess medication effectiveness - Recommended use of Albuterol  inhaler for acute exacerbations   - Tiotropium Bromide -Olodaterol 2.5-2.5 MCG/ACT AERS; Inhale 2 puffs into the lungs daily.  Dispense: 4 g; Refill: 5  2. Immunization due - Flu vaccine trivalent PF, 6mos and older(Flulaval,Afluria,Fluarix,Fluzone)    Patient was given the opportunity to ask questions.  Patient verbalized understanding of the plan and was able to repeat key elements of the plan. Patient was given  clear instructions to go to Emergency Department or return to medical center if symptoms don't improve, worsen, or new problems develop.The patient verbalized understanding.   Orders Placed This Encounter  Procedures   Flu vaccine trivalent PF, 6mos and older(Flulaval,Afluria,Fluarix,Fluzone)     Requested Prescriptions   Signed Prescriptions Disp Refills   Tiotropium Bromide -Olodaterol 2.5-2.5 MCG/ACT AERS 4 g 5    Sig: Inhale 2 puffs into the lungs daily.    Return in about 1 month (around 10/12/2024) for follow-up COPD.  Sula Leavy Rode, PA-C

## 2024-09-12 NOTE — Telephone Encounter (Unsigned)
 Copied from CRM #[id number]. Topic: Clinical - Medication Refill >> Sep 12, 2024  4:04 PM Joesph B wrote: Medication:  albuterol  (VENTOLIN  HFA) 108 (90 Base) MCG/ACT inhaler   Has the patient contacted their pharmacy? Yes (Agent: If no, request that the patient contact the pharmacy for the refill. If patient does not wish to contact the pharmacy document the reason why and proceed with request.) (Agent: If yes, when and what did the pharmacy advise?)  This is the patient's preferred pharmacy:  [id number] [address] [address], [place] - [address] [place] [address] Phone: [phone] Fax: [phone]  Is this the correct pharmacy for this prescription? Yes If no, delete pharmacy and type the correct one.   Has the prescription been filled recently? No  Is the patient out of the medication? Yes  Has the patient been seen for an appointment in the last year OR does the patient have an upcoming appointment? Yes  Can we respond through MyChart? Yes  Agent: Please be advised that Rx refills may take up to 3 business days. We ask that you follow-up with your pharmacy.

## 2024-09-15 ENCOUNTER — Telehealth: Payer: Self-pay

## 2024-09-15 LAB — MTB-RIF NAA W AFB CULT, NON-SPUTUM
Acid Fast Culture: NEGATIVE
M tuberculosis complex: NOT DETECTED

## 2024-09-15 MED ORDER — ALBUTEROL SULFATE HFA 108 (90 BASE) MCG/ACT IN AERS: 2.0000 | INHALATION_SPRAY | Freq: Four times a day (QID) | RESPIRATORY_TRACT | 1 refills | Status: DC | PRN

## 2024-09-15 NOTE — Telephone Encounter (Signed)
 PA APPROVED FOR THE PATIENT UNTIL 03/14/25. Avriel Kandel T Veva

## 2024-09-15 NOTE — Telephone Encounter (Signed)
albuterol (VENTOLIN HFA) 108 (90 Base) MCG/ACT inhaler ?

## 2024-09-15 NOTE — Telephone Encounter (Signed)
 PA submitted for the patient through covermymeds.  Key: [id number]

## 2024-09-15 NOTE — Telephone Encounter (Signed)
 Signed.

## 2024-09-16 ENCOUNTER — Encounter: Payer: Self-pay | Admitting: Emergency Medicine

## 2024-09-16 ENCOUNTER — Ambulatory Visit
Admission: EM | Admit: 2024-09-16 | Discharge: 2024-09-16 | Disposition: A | Discharge status: Home / Self Care | Attending: Emergency Medicine | Admitting: Emergency Medicine

## 2024-09-16 ENCOUNTER — Other Ambulatory Visit: Payer: Self-pay

## 2024-09-16 ENCOUNTER — Ambulatory Visit: Payer: Self-pay

## 2024-09-16 DIAGNOSIS — H65191 Other acute nonsuppurative otitis media, right ear: Secondary | ICD-10-CM

## 2024-09-16 DIAGNOSIS — H60501 Unspecified acute noninfective otitis externa, right ear: Secondary | ICD-10-CM | POA: Diagnosis not present

## 2024-09-16 MED ORDER — AMOXICILLIN-POT CLAVULANATE 875-125 MG PO TABS: 1.0000 | ORAL_TABLET | Freq: Two times a day (BID) | ORAL | 0 refills | Status: AC

## 2024-09-16 MED ORDER — CIPROFLOXACIN-DEXAMETHASONE 0.3-0.1 % OT SUSP: 4.0000 [drp] | Freq: Two times a day (BID) | OTIC | 0 refills | Status: AC

## 2024-09-16 NOTE — ED Triage Notes (Signed)
Pt here for right sided ear pain x 2 days

## 2024-09-16 NOTE — Telephone Encounter (Signed)
 FYI Only or Action Required?: FYI only for provider. Dispo UC  Patient was last seen in primary care on 09/12/2024 by Leavy Rode, Sula, PA-C.  Called Nurse Triage reporting Ear Pain.  Symptoms began yesterday.  Interventions attempted: OTC medications: Ibuprofen  and ear drops.  Symptoms are: gradually worsening.  Triage Disposition: See HCP Within 4 Hours (Or PCP Triage)  Patient/caregiver understands and will follow disposition?: Yes Copied from CRM [id number]. Topic: Clinical - Red Word Triage >> Sep 16, 2024 12:42 PM Dedra B wrote: Red Word that prompted transfer to Nurse Triage: Pt says she is having excruciating pain in R ear and headache. She said it feels like her eardrum is about to explode. Warm transfer to nurse triage. Reason for Disposition  [1] SEVERE pain (e.g., excruciating) and [2] not improved 2 hours after pain medicine (e.g., acetaminophen  or ibuprofen )  Answer Assessment - Initial Assessment Questions Started yesterday in her right ear. States she has not submerged her head or had trauma to her inner ear. She has not slept in 24hrs due to pain.  Ibuprofen  last night and this morning, ear drops, sweet oil  Hx of TMJ- just picked up meloxicam - she will try this now as it has been enough time since her Ibuprofen .  1. LOCATION: Which ear is involved?     R ear 2. ONSET: When did the ear pain start?      Yesterday 9/29 3. SEVERITY: How bad is the pain?  (Scale 1-10; mild, moderate or severe)     10/10- sharp achy- has not been able to sleep with the pain  4. URI SYMPTOMS: Do you have a runny nose or cough?     denies 5. FEVER: Do you have a fever? If Yes, ask: What is your temperature, how was it measured, and when did it start?     denies 6. CAUSE: Have you been swimming recently?, How often do you use Q-TIPS?, Have you had any recent air travel or scuba diving?     Unknown cause, denies 7. OTHER SYMPTOMS: Do you have any other symptoms?  (e.g., decreased hearing, dizziness, headache, stiff neck, vomiting)     Hurts to even eat, slight nausea  Protocols used: Earache-A-AH

## 2024-09-16 NOTE — ED Provider Notes (Signed)
 [facility]    CSN: [id number] Arrival date & time: 09/16/24  1319      History   Chief Complaint Chief Complaint  Patient presents with   Otalgia    HPI Janice Williams is a 63 y.o. female.  Right ear pain since yesterday Reports severe, and causes right side headache  No fever, congestion, cough No trauma. No water in ears known.   Has tried ibuprofen  and OTC ear drops  History of pulsatile tinnitus, chronic sinusitis  Past Medical History:  Diagnosis Date   Anemia    Arthritis    generalized   Blood transfusion without reported diagnosis 2003   Bronchitis    Cataract    COPD (chronic obstructive pulmonary disease) (HCC)    COPD with acute exacerbation (HCC) 06/09/2017   Dental abscess 03/15/2015   DYSPEPSIA 02/14/2007   Qualifier: Diagnosis of  By: Manford Longs     EXTERNAL OTITIS 01/06/2010   Qualifier: Diagnosis of  By: Janna MD, Burnard     FACIAL RASH 02/04/2009   Qualifier: Diagnosis of  By: Cleotilde Pencil     Former smoker    quit 2014   GERD (gastroesophageal reflux disease)    Glaucoma    HIV infection (HCC)    Hypertension    Osteoporosis    Seasonal allergies    Stroke (HCC) 2003   Substance abuse (HCC)    history, clean 7 years   SVD (spontaneous vaginal delivery)    x 3    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   Pulmonary emphysema (HCC) 07/30/2024   Intractable nausea and vomiting 07/29/2024   Allergic rhinitis due to animal hair and dander 07/11/2024   Mild persistent asthma, uncomplicated 07/11/2024   Other chronic allergic conjunctivitis 07/11/2024   Other allergic rhinitis 07/11/2024   Vitamin D  deficiency 08/08/2023   Rash of hand 12/21/2022   Former cigarette smoker 11/16/2022   Sprain of calcaneofibular ligament 02/28/2022   Degenerative disc  disease, cervical 10/13/2020   Osteoporosis 10/10/2019   Chronic obstructive pulmonary disease (HCC) 07/08/2019   Moderate protein-calorie malnutrition 05/31/2018   Hypertension 06/08/2017   Hyperlipidemia 06/03/2009   Human immunodeficiency virus (HIV) disease (HCC) 10/29/2008   GERD 10/29/2008   CEREBROVASCULAR ACCIDENT, HX OF 10/29/2008   Symptoms concerning nutrition, metabolism, and development 02/14/2007   Rhinitis, chronic 02/14/2007    Past Surgical History:  Procedure Laterality Date   COLONOSCOPY  2019   KN-MAC-suprep(adeq)-tics/hems/TA x 5   MULTIPLE TOOTH EXTRACTIONS     with sedation   POLYPECTOMY  01/2008   TA x 5   TONSILLECTOMY  1973   TUBAL LIGATION     UPPER GI ENDOSCOPY     normal per patient - years ago    OB History     Gravida  3   Para  3   Term  3   Preterm      AB      Living  3      SAB      IAB      Ectopic      Multiple      Live Births  3            Home Medications    Prior to Admission medications   Medication Sig Start Date End Date Taking? Authorizing  Provider  amoxicillin -clavulanate (AUGMENTIN ) 875-125 MG tablet Take 1 tablet by mouth every 12 (twelve) hours for 7 days. 09/16/24 09/23/24 Yes Lesslie Mossa, Asberry, PA-C  ciprofloxacin-dexamethasone (CIPRODEX) OTIC suspension Place 4 drops into the right ear 2 (two) times daily for 5 days. 09/16/24 09/21/24 Yes Chancelor Hardrick, Asberry, PA-C  albuterol  (VENTOLIN  HFA) 108 (90 Base) MCG/ACT inhaler Inhale 2 puffs into the lungs every 6 (six) hours as needed for wheezing or shortness of breath. 09/15/24   Leavy Rode, Sula, PA-C  amLODipine  (NORVASC ) 10 MG tablet Take 1 tablet (10 mg total) by mouth daily. 03/19/24 03/19/25  Marylu Gee, DO  bictegravir-emtricitabine -tenofovir  AF (BIKTARVY ) 50-200-25 MG TABS tablet Take 1 tablet by mouth daily. 08/28/24   Luiz Channel, MD  Ensure (ENSURE) Take 237 mLs by mouth 3 (three) times daily between meals. 12/20/23   Luiz Channel, MD  EPIPEN   2-PAK 0.3 MG/0.3ML SOAJ injection  03/21/22   [provider]  fluticasone  (FLONASE ) 50 MCG/ACT nasal spray Place 2 sprays into both nostrils daily. 09/03/24   Tapia Cedeno, Jorge, PA-C  levocetirizine (XYZAL ) 5 MG tablet Take 1 tablet (5 mg total) by mouth every evening. 12/26/23   Luiz Channel, MD  MIEBO 1.338 GM/ML SOLN  02/25/24   [provider]  mirtazapine  (REMERON ) 15 MG tablet Take 1 tablet (15 mg total) by mouth at bedtime. 08/13/24   Luiz Channel, MD  pantoprazole  (PROTONIX ) 40 MG tablet Take 1 tablet (40 mg total) by mouth daily. 08/13/24 09/12/24  Luiz Channel, MD  promethazine  (PHENERGAN ) 12.5 MG tablet Take 1 tablet (12.5 mg total) by mouth every 6 (six) hours as needed for nausea or vomiting. 08/13/24   Luiz Channel, MD  RESTASIS 0.05 % ophthalmic emulsion 1 drop 2 (two) times daily. 08/05/21   [provider]  rosuvastatin  (CRESTOR ) 40 MG tablet Take 1 tablet (40 mg total) by mouth daily. 12/27/23 12/26/24  Marylu Gee, DO  Tiotropium Bromide -Olodaterol 2.5-2.5 MCG/ACT AERS Inhale 2 puffs into the lungs daily. 09/12/24   Tapia Cedeno, Jorge, PA-C  traMADol  (ULTRAM ) 50 MG tablet Take 1 tablet (50 mg total) by mouth every 6 (six) hours as needed. 08/13/24   Luiz Channel, MD  TRYPTYR 0.003 % SOLN Apply 1 drop to eye 2 (two) times daily. 08/25/24   [provider]    Family History Family History  Problem Relation Age of Onset   Hypertension Mother    Hypertension Father    Hyperlipidemia Father    COPD Father    Atrial fibrillation Father    Colon cancer Neg Hx    Rectal cancer Neg Hx    Stomach cancer Neg Hx    Colon polyps Neg Hx    Esophageal cancer Neg Hx    BRCA 1/2 Neg Hx    Breast cancer Neg Hx     Social History Social History   Tobacco Use   Smoking status: Former    Current packs/day: 0.00    Average packs/day: 2.0 packs/day for 38.0 years (76.1 ttl pk-yrs)    Types: Cigarettes    Start date: 12/18/2012    Quit date:  11/21/2013    Years since quitting: 10.8    Passive exposure: Past   Smokeless tobacco: Never  Vaping Use   Vaping status: Never Used  Substance Use Topics   Alcohol use: Not Currently   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     Allergies   Varenicline    Review of Systems Review of  Systems  HENT:  Positive for ear pain.    As per HPI  Physical Exam Triage Vital Signs ED Triage Vitals [09/16/24 1502]  Encounter Vitals Group     BP 129/83     Girls Systolic BP Percentile      Girls Diastolic BP Percentile      Boys Systolic BP Percentile      Boys Diastolic BP Percentile      Pulse Rate (!) 101     Resp 18     Temp 98.4 F (36.9 C)     Temp Source Oral     SpO2 95 %     Weight      Height      Head Circumference      Peak Flow      Pain Score      Pain Loc      Pain Education      Exclude from Growth Chart    No data found.  Updated Vital Signs BP 129/83 (BP Location: Left Arm)   Pulse (!) 101   Temp 98.4 F (36.9 C) (Oral)   Resp 18   SpO2 95%   Visual Acuity Right Eye Distance:   Left Eye Distance:   Bilateral Distance:    Right Eye Near:   Left Eye Near:    Bilateral Near:     Physical Exam Vitals and nursing note reviewed.  Constitutional:      General: She is not in acute distress.    Appearance: She is not ill-appearing.  HENT:     Right Ear: Drainage, swelling and tenderness present. Tympanic membrane is injected and erythematous.     Left Ear: Tympanic membrane and ear canal normal.     Mouth/Throat:     Mouth: Mucous membranes are moist.     Pharynx: Oropharynx is clear.  Eyes:     Extraocular Movements: Extraocular movements intact.     Pupils: Pupils are equal, round, and reactive to light.  Cardiovascular:     Rate and Rhythm: Normal rate and regular rhythm.     Heart sounds: Normal heart sounds.  Pulmonary:     Effort: Pulmonary effort is normal.     Breath sounds: Normal breath sounds.  Musculoskeletal:      Cervical back: Normal range of motion. No rigidity or tenderness.  Lymphadenopathy:     Cervical: No cervical adenopathy.  Skin:    General: Skin is warm and dry.  Neurological:     Mental Status: She is alert and oriented to person, place, and time.      UC Treatments / Results  Labs (all labs ordered are listed, but only abnormal results are displayed) Labs Reviewed - No data to display  EKG   Radiology No results found.  Procedures Procedures (including critical care time)  Medications Ordered in UC Medications - No data to display  Initial Impression / Assessment and Plan / UC Course  I have reviewed the triage vital signs and the nursing notes.  Pertinent labs & imaging results that were available during my care of the patient were reviewed by me and considered in my medical decision making (see chart for details).  Right otitis externa Ciprodex BID x 5 days  Right otitis media Augmentin  BID x 7 days  Continue other supportive care. Advised use of tylenol  as she is on Biktarvy  and should not use frequent NSAIDs. Return precautions Agrees to plan, no questions  Final Clinical Impressions(s) / UC Diagnoses  Final diagnoses:  Other non-recurrent acute nonsuppurative otitis media of right ear  Acute otitis externa of right ear, unspecified type     Discharge Instructions      You have an ear infection of both the ear canal and the ear drum  To treat the ear drum infection -- take the Augmentin  antibiotic. Twice daily (every 12 hours) for 7 days in a row. Take with food to avoid upset stomach. Finish all the pills!  To treat the ear canal infection -- use the CiproDex antibiotic ear drops. Twice daily for 5 days.       ED Prescriptions     Medication Sig Dispense Auth. Provider   amoxicillin -clavulanate (AUGMENTIN ) 875-125 MG tablet Take 1 tablet by mouth every 12 (twelve) hours for 7 days. 14 tablet Christean Silvestri, PA-C    ciprofloxacin-dexamethasone (CIPRODEX) OTIC suspension Place 4 drops into the right ear 2 (two) times daily for 5 days. 7.5 mL Kymia Simi, Asberry, PA-C      PDMP not reviewed this encounter.   Jeryl Asberry, [place] 09/16/24 8483

## 2024-09-16 NOTE — Discharge Instructions (Addendum)
 You have an ear infection of both the ear canal and the ear drum  To treat the ear drum infection -- take the Augmentin  antibiotic. Twice daily (every 12 hours) for 7 days in a row. Take with food to avoid upset stomach. Finish all the pills!  To treat the ear canal infection -- use the CiproDex antibiotic ear drops. Twice daily for 5 days.

## 2024-09-17 ENCOUNTER — Telehealth: Payer: Self-pay

## 2024-09-17 NOTE — Telephone Encounter (Signed)
 Copied from CRM #[id number]. Topic: Clinical - Medication Question >> Sep 17, 2024  2:02 PM Joesph B wrote: Reason for CRM:  Urgent care yesterday, put her on antibiotics and ear drops. 500 mg tylenol . Wants to know if she can take two.

## 2024-09-17 NOTE — Telephone Encounter (Signed)
 Pt also called and LVM with Urgent Care. This RN returned call and addressed pt's question. Advised of max daily dose for Tylenol . Verbalized understanding.

## 2024-09-18 ENCOUNTER — Ambulatory Visit (HOSPITAL_COMMUNITY): Admission: RE | Admit: 2024-09-18 | Source: Ambulatory Visit

## 2024-09-20 ENCOUNTER — Telehealth: Admitting: Nurse Practitioner

## 2024-09-20 DIAGNOSIS — B370 Candidal stomatitis: Secondary | ICD-10-CM | POA: Diagnosis not present

## 2024-09-20 MED ORDER — FLUCONAZOLE 200 MG PO TABS: 200.0000 mg | ORAL_TABLET | Freq: Every day | ORAL | 0 refills | Status: AC

## 2024-09-20 NOTE — Patient Instructions (Signed)
 Janice Williams, thank you for joining Janice LELON Servant, NP for today's virtual visit.  While this provider is not your primary care provider (PCP), if your PCP is located in our provider database this encounter information will be shared with them immediately following your visit.   A [facility] MyChart account gives you access to today's visit and all your visits, tests, and labs performed at [facility]  click here if you don't have a [facility] MyChart account or go to [email or web address]  Consent: (Patient) Janice Williams provided verbal consent for this virtual visit at the beginning of the encounter.  Current Medications:  Current Outpatient Medications:    fluconazole  (DIFLUCAN ) 200 MG tablet, Take 1 tablet (200 mg total) by mouth daily for 10 days., Disp: 10 tablet, Rfl: 0   albuterol  (VENTOLIN  HFA) 108 (90 Base) MCG/ACT inhaler, Inhale 2 puffs into the lungs every 6 (six) hours as needed for wheezing or shortness of breath., Disp: 18 g, Rfl: 1   amLODipine  (NORVASC ) 10 MG tablet, Take 1 tablet (10 mg total) by mouth daily., Disp: 90 tablet, Rfl: 3   amoxicillin -clavulanate (AUGMENTIN ) 875-125 MG tablet, Take 1 tablet by mouth every 12 (twelve) hours for 7 days., Disp: 14 tablet, Rfl: 0   bictegravir-emtricitabine -tenofovir  AF (BIKTARVY ) 50-200-25 MG TABS tablet, Take 1 tablet by mouth daily., Disp: 30 tablet, Rfl: 2   ciprofloxacin-dexamethasone (CIPRODEX) OTIC suspension, Place 4 drops into the right ear 2 (two) times daily for 5 days., Disp: 7.5 mL, Rfl: 0   Ensure (ENSURE), Take 237 mLs by mouth 3 (three) times daily between meals., Disp: 237 mL, Rfl: 5   EPIPEN  2-PAK 0.3 MG/0.3ML SOAJ injection, , Disp: , Rfl:    fluticasone  (FLONASE ) 50 MCG/ACT nasal spray, Place 2 sprays into both nostrils daily., Disp: 16 g, Rfl: 6   levocetirizine (XYZAL ) 5 MG tablet, Take 1 tablet (5 mg total) by mouth every evening., Disp: 90 tablet, Rfl: 3   MIEBO 1.338 GM/ML SOLN, ,  Disp: , Rfl:    mirtazapine  (REMERON ) 15 MG tablet, Take 1 tablet (15 mg total) by mouth at bedtime., Disp: 30 tablet, Rfl: 11   pantoprazole  (PROTONIX ) 40 MG tablet, Take 1 tablet (40 mg total) by mouth daily., Disp: 30 tablet, Rfl: 0   promethazine  (PHENERGAN ) 12.5 MG tablet, Take 1 tablet (12.5 mg total) by mouth every 6 (six) hours as needed for nausea or vomiting., Disp: 30 tablet, Rfl: 0   RESTASIS 0.05 % ophthalmic emulsion, 1 drop 2 (two) times daily., Disp: , Rfl:    rosuvastatin  (CRESTOR ) 40 MG tablet, Take 1 tablet (40 mg total) by mouth daily., Disp: 90 tablet, Rfl: 3   Tiotropium Bromide -Olodaterol 2.5-2.5 MCG/ACT AERS, Inhale 2 puffs into the lungs daily., Disp: 4 g, Rfl: 5   traMADol  (ULTRAM ) 50 MG tablet, Take 1 tablet (50 mg total) by mouth every 6 (six) hours as needed., Disp: 60 tablet, Rfl: 3   TRYPTYR 0.003 % SOLN, Apply 1 drop to eye 2 (two) times daily., Disp: , Rfl:    Medications ordered in this encounter:  Meds ordered this encounter  Medications   fluconazole  (DIFLUCAN ) 200 MG tablet    Sig: Take 1 tablet (200 mg total) by mouth daily for 10 days.    Dispense:  10 tablet    Refill:  0    Supervising Provider:   BLAISE ALEENE Williams [phone]     *If you need refills on other medications prior to your next appointment,  please contact your pharmacy*  Follow-Up: Call back or seek an in-person evaluation if the symptoms worsen or if the condition fails to improve as anticipated.  [facility] [phone]  Other Instructions    If you have been instructed to have an in-person evaluation today at a local Urgent Care facility, please use the link below. It will take you to a list of all of our available [facility], including address, phone number and hours of operation. Please do not delay care.  [facility]  If you or a family member do not have a primary care provider, use the link below to schedule a visit and establish  care. When you choose a [facility] primary care physician or advanced practice provider, you gain a long-term partner in health. Find a Primary Care Provider  Learn more about [facility]'s in-office and virtual care options: [facility] - Get Care Now

## 2024-09-20 NOTE — Progress Notes (Signed)
 Virtual Visit Consent   Janice Williams, you are scheduled for a virtual visit with a [place] provider today. Just as with appointments in the office, your consent must be obtained to participate. Your consent will be active for this visit and any virtual visit you may have with one of our providers in the next 365 days. If you have a MyChart account, a copy of this consent can be sent to you electronically.  As this is a virtual visit, video technology does not allow for your provider to perform a traditional examination. This may limit your provider's ability to fully assess your condition. If your provider identifies any concerns that need to be evaluated in person or the need to arrange testing (such as labs, EKG, etc.), we will make arrangements to do so. Although advances in technology are sophisticated, we cannot ensure that it will always work on either your end or our end. If the connection with a video visit is poor, the visit may have to be switched to a telephone visit. With either a video or telephone visit, we are not always able to ensure that we have a secure connection.  By engaging in this virtual visit, you consent to the provision of healthcare and authorize for your insurance to be billed (if applicable) for the services provided during this visit. Depending on your insurance coverage, you may receive a charge related to this service.  I need to obtain your verbal consent now. Are you willing to proceed with your visit today? Janice Williams has provided verbal consent on 09/20/2024 for a virtual visit (video or telephone). Janice LELON Servant, NP  Date: 09/20/2024 9:23 AM   Virtual Visit via Video Note   I, Janice Williams, connected with  Janice Williams  ([id number], 1961/05/21) on 09/20/24 at  9:15 AM EDT by a video-enabled telemedicine application and verified that I am speaking with the correct person using two identifiers.  Location: Patient: Virtual Visit Location Patient:  Home Provider: Virtual Visit Location Provider: Home Office   I discussed the limitations of evaluation and management by telemedicine and the availability of in person appointments. The patient expressed understanding and agreed to proceed.    History of Present Illness: Janice Williams is a 63 y.o. who identifies as a female who was assigned female at birth, and is being seen today for oral thrush  Janice Williams states she was started on new inhalers by her primary care and since starting she has been experiencing throat pain, tongue pain with redness, thick white film on tongue along with bilateral ear pain and headache.  She recalls experiencing similar symptoms in the past and was prescribed Diflucan  as nystatin  liquid was ineffective.      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   Pulmonary emphysema (HCC) 07/30/2024   Intractable nausea and vomiting 07/29/2024   Allergic rhinitis due to animal hair and dander 07/11/2024   Mild persistent asthma, uncomplicated 07/11/2024   Other chronic allergic conjunctivitis 07/11/2024   Other allergic rhinitis 07/11/2024   Vitamin D  deficiency 08/08/2023   Rash of hand 12/21/2022   Former cigarette smoker 11/16/2022   Sprain of calcaneofibular ligament 02/28/2022   Degenerative disc disease, cervical 10/13/2020   Osteoporosis 10/10/2019   Chronic obstructive pulmonary disease (HCC) 07/08/2019   Moderate protein-calorie  malnutrition 05/31/2018   Hypertension 06/08/2017   Hyperlipidemia 06/03/2009   Human immunodeficiency virus (HIV) disease (HCC) 10/29/2008   GERD 10/29/2008   CEREBROVASCULAR ACCIDENT, HX OF 10/29/2008   Symptoms concerning nutrition, metabolism, and development 02/14/2007   Rhinitis, chronic 02/14/2007    Allergies:  Allergies  Allergen Reactions    Varenicline  Itching, Other (See Comments) and Dermatitis   Medications:  Current Outpatient Medications:    fluconazole  (DIFLUCAN ) 200 MG tablet, Take 1 tablet (200 mg total) by mouth daily for 10 days., Disp: 10 tablet, Rfl: 0   albuterol  (VENTOLIN  HFA) 108 (90 Base) MCG/ACT inhaler, Inhale 2 puffs into the lungs every 6 (six) hours as needed for wheezing or shortness of breath., Disp: 18 g, Rfl: 1   amLODipine  (NORVASC ) 10 MG tablet, Take 1 tablet (10 mg total) by mouth daily., Disp: 90 tablet, Rfl: 3   amoxicillin -clavulanate (AUGMENTIN ) 875-125 MG tablet, Take 1 tablet by mouth every 12 (twelve) hours for 7 days., Disp: 14 tablet, Rfl: 0   bictegravir-emtricitabine -tenofovir  AF (BIKTARVY ) 50-200-25 MG TABS tablet, Take 1 tablet by mouth daily., Disp: 30 tablet, Rfl: 2   ciprofloxacin-dexamethasone (CIPRODEX) OTIC suspension, Place 4 drops into the right ear 2 (two) times daily for 5 days., Disp: 7.5 mL, Rfl: 0   Ensure (ENSURE), Take 237 mLs by mouth 3 (three) times daily between meals., Disp: 237 mL, Rfl: 5   EPIPEN  2-PAK 0.3 MG/0.3ML SOAJ injection, , Disp: , Rfl:    fluticasone  (FLONASE ) 50 MCG/ACT nasal spray, Place 2 sprays into both nostrils daily., Disp: 16 g, Rfl: 6   levocetirizine (XYZAL ) 5 MG tablet, Take 1 tablet (5 mg total) by mouth every evening., Disp: 90 tablet, Rfl: 3   MIEBO 1.338 GM/ML SOLN, , Disp: , Rfl:    mirtazapine  (REMERON ) 15 MG tablet, Take 1 tablet (15 mg total) by mouth at bedtime., Disp: 30 tablet, Rfl: 11   pantoprazole  (PROTONIX ) 40 MG tablet, Take 1 tablet (40 mg total) by mouth daily., Disp: 30 tablet, Rfl: 0   promethazine  (PHENERGAN ) 12.5 MG tablet, Take 1 tablet (12.5 mg total) by mouth every 6 (six) hours as needed for nausea or vomiting., Disp: 30 tablet, Rfl: 0   RESTASIS 0.05 % ophthalmic emulsion, 1 drop 2 (two) times daily., Disp: , Rfl:    rosuvastatin  (CRESTOR ) 40 MG tablet, Take 1 tablet (40 mg total) by mouth daily., Disp: 90 tablet, Rfl: 3    Tiotropium Bromide -Olodaterol 2.5-2.5 MCG/ACT AERS, Inhale 2 puffs into the lungs daily., Disp: 4 g, Rfl: 5   traMADol  (ULTRAM ) 50 MG tablet, Take 1 tablet (50 mg total) by mouth every 6 (six) hours as needed., Disp: 60 tablet, Rfl: 3   TRYPTYR 0.003 % SOLN, Apply 1 drop to eye 2 (two) times daily., Disp: , Rfl:   Observations/Objective: Patient is well-developed, well-nourished in no acute distress.  Resting comfortably at home.  Head is normocephalic, atraumatic.  No labored breathing.  Speech is clear and coherent with logical content.  Patient is alert and oriented at baseline.    Assessment and Plan: 1. Oral candidiasis (Primary) - fluconazole  (DIFLUCAN ) 200 MG tablet; Take 1 tablet (200 mg total) by mouth daily for 10 days.  Dispense: 10 tablet; Refill: 0    Follow Up Instructions: I discussed the assessment and treatment plan with the patient. The patient was provided Janice opportunity to ask questions and all were answered. The patient agreed with the plan and demonstrated Janice understanding of the instructions.  A copy  of instructions were sent to the patient via MyChart unless otherwise noted below.     The patient was advised to call back or seek Janice in-person evaluation if the symptoms worsen or if the condition fails to improve as anticipated.    Liara Holm W Koven Belinsky, NP

## 2024-09-21 ENCOUNTER — Ambulatory Visit (HOSPITAL_COMMUNITY)

## 2024-09-21 ENCOUNTER — Telehealth: Admitting: Family

## 2024-09-21 DIAGNOSIS — H66004 Acute suppurative otitis media without spontaneous rupture of ear drum, recurrent, right ear: Secondary | ICD-10-CM | POA: Diagnosis not present

## 2024-09-21 DIAGNOSIS — B370 Candidal stomatitis: Secondary | ICD-10-CM

## 2024-09-21 MED ORDER — CEFDINIR 300 MG PO CAPS: 300.0000 mg | ORAL_CAPSULE | Freq: Two times a day (BID) | ORAL | 0 refills | Status: DC

## 2024-09-21 MED ORDER — NYSTATIN 100000 UNIT/ML MT SUSP: 5.0000 mL | Freq: Four times a day (QID) | OROMUCOSAL | 0 refills | Status: DC

## 2024-09-21 NOTE — Progress Notes (Signed)
 Virtual Visit Consent   Janice Williams, you are scheduled for a virtual visit with a [facility] [facility] provider today. Just as with appointments in the office, your consent must be obtained to participate. Your consent will be active for this visit and any virtual visit you may have with one of our providers in the next 365 days. If you have a MyChart account, a copy of this consent can be sent to you electronically.  As this is a virtual visit, video technology does not allow for your provider to perform a traditional examination. This may limit your provider's ability to fully assess your condition. If your provider identifies any concerns that need to be evaluated in person or the need to arrange testing (such as labs, EKG, etc.), we will make arrangements to do so. Although advances in technology are sophisticated, we cannot ensure that it will always work on either your end or our end. If the connection with a video visit is poor, the visit may have to be switched to a telephone visit. With either a video or telephone visit, we are not always able to ensure that we have a secure connection.  By engaging in this virtual visit, you consent to the provision of healthcare and authorize for your insurance to be billed (if applicable) for the services provided during this visit. Depending on your insurance coverage, you may receive a charge related to this service.  I need to obtain your verbal consent now. Are you willing to proceed with your visit today? Gael D Humiston has provided verbal consent on 09/21/2024 for a virtual visit (video or telephone). Bari Learn, FNP  Date: 09/21/2024 12:51 PM   Virtual Visit via Video Note   I, Bari Learn, connected with  Janice Williams  ([id number], October 22, 1961) on 09/21/24 at 12:45 PM EDT by a video-enabled telemedicine application and verified that I am speaking with the correct person using two identifiers.  Location: Patient: Virtual Visit Location Patient:  Home Provider: Virtual Visit Location Provider: Home Office   I discussed the limitations of evaluation and management by telemedicine and the availability of in person appointments. The patient expressed understanding and agreed to proceed.    History of Present Illness: Janice Williams is a 63 y.o. who identifies as a female who was assigned female at birth, and is being seen today for headache. She was diagnosed with Otitis  media of her right ear at the urgent Care on 09/16/24. She was given Augmentin  for day 7 days and cipro and dex drops. She has a virtual visit yesterday for oral thrush. She was given diflucan . She is unsure if she should complete the Augmentin . She reports her ear pain continues to be a 9 out 10 and is unchanged from her Urgent Care.   HPI: Otalgia  There is pain in the right ear. This is a new problem. The current episode started 1 to 4 weeks ago. The problem has been unchanged. The pain is at a severity of 9/10. Associated symptoms include headaches, hearing loss and rhinorrhea. Pertinent negatives include no coughing, diarrhea, rash, sore throat or vomiting. She has tried antibiotics and ear drops for the symptoms. The treatment provided mild relief.    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   Pulmonary emphysema (HCC) 07/30/2024   Intractable nausea and  vomiting 07/29/2024   Allergic rhinitis due to animal hair and dander 07/11/2024   Mild persistent asthma, uncomplicated 07/11/2024   Other chronic allergic conjunctivitis 07/11/2024   Other allergic rhinitis 07/11/2024   Vitamin D  deficiency 08/08/2023   Rash of hand 12/21/2022   Former cigarette smoker 11/16/2022   Sprain of calcaneofibular ligament 02/28/2022   Degenerative disc disease, cervical 10/13/2020   Osteoporosis 10/10/2019    Chronic obstructive pulmonary disease (HCC) 07/08/2019   Moderate protein-calorie malnutrition 05/31/2018   Hypertension 06/08/2017   Hyperlipidemia 06/03/2009   Human immunodeficiency virus (HIV) disease (HCC) 10/29/2008   GERD 10/29/2008   CEREBROVASCULAR ACCIDENT, HX OF 10/29/2008   Symptoms concerning nutrition, metabolism, and development 02/14/2007   Rhinitis, chronic 02/14/2007    Allergies:  Allergies  Allergen Reactions   Varenicline  Itching, Other (See Comments) and Dermatitis   Medications:  Current Outpatient Medications:    cefdinir  (OMNICEF ) 300 MG capsule, Take 1 capsule (300 mg total) by mouth 2 (two) times daily. 1 po BID, Disp: 20 capsule, Rfl: 0   nystatin  (MYCOSTATIN ) 100000 UNIT/ML suspension, Take 5 mLs (500,000 Units total) by mouth 4 (four) times daily., Disp: 473 mL, Rfl: 0   albuterol  (VENTOLIN  HFA) 108 (90 Base) MCG/ACT inhaler, Inhale 2 puffs into the lungs every 6 (six) hours as needed for wheezing or shortness of breath., Disp: 18 g, Rfl: 1   amLODipine  (NORVASC ) 10 MG tablet, Take 1 tablet (10 mg total) by mouth daily., Disp: 90 tablet, Rfl: 3   amoxicillin -clavulanate (AUGMENTIN ) 875-125 MG tablet, Take 1 tablet by mouth every 12 (twelve) hours for 7 days., Disp: 14 tablet, Rfl: 0   bictegravir-emtricitabine -tenofovir  AF (BIKTARVY ) 50-200-25 MG TABS tablet, Take 1 tablet by mouth daily., Disp: 30 tablet, Rfl: 2   ciprofloxacin-dexamethasone (CIPRODEX) OTIC suspension, Place 4 drops into the right ear 2 (two) times daily for 5 days., Disp: 7.5 mL, Rfl: 0   Ensure (ENSURE), Take 237 mLs by mouth 3 (three) times daily between meals., Disp: 237 mL, Rfl: 5   EPIPEN  2-PAK 0.3 MG/0.3ML SOAJ injection, , Disp: , Rfl:    fluconazole  (DIFLUCAN ) 200 MG tablet, Take 1 tablet (200 mg total) by mouth daily for 10 days., Disp: 10 tablet, Rfl: 0   fluticasone  (FLONASE ) 50 MCG/ACT nasal spray, Place 2 sprays into both nostrils daily., Disp: 16 g, Rfl: 6   levocetirizine  (XYZAL ) 5 MG tablet, Take 1 tablet (5 mg total) by mouth every evening., Disp: 90 tablet, Rfl: 3   MIEBO 1.338 GM/ML SOLN, , Disp: , Rfl:    mirtazapine  (REMERON ) 15 MG tablet, Take 1 tablet (15 mg total) by mouth at bedtime., Disp: 30 tablet, Rfl: 11   pantoprazole  (PROTONIX ) 40 MG tablet, Take 1 tablet (40 mg total) by mouth daily., Disp: 30 tablet, Rfl: 0   promethazine  (PHENERGAN ) 12.5 MG tablet, Take 1 tablet (12.5 mg total) by mouth every 6 (six) hours as needed for nausea or vomiting., Disp: 30 tablet, Rfl: 0   RESTASIS 0.05 % ophthalmic emulsion, 1 drop 2 (two) times daily., Disp: , Rfl:    rosuvastatin  (CRESTOR ) 40 MG tablet, Take 1 tablet (40 mg total) by mouth daily., Disp: 90 tablet, Rfl: 3   Tiotropium Bromide -Olodaterol 2.5-2.5 MCG/ACT AERS, Inhale 2 puffs into the lungs daily., Disp: 4 g, Rfl: 5   traMADol  (ULTRAM ) 50 MG tablet, Take 1 tablet (50 mg total) by mouth every 6 (six) hours as needed., Disp: 60 tablet, Rfl: 3  Observations/Objective: Patient is well-developed, well-nourished in  no acute distress.  Resting comfortably  at home.  Head is normocephalic, atraumatic.  No labored breathing.  Speech is clear and coherent with logical content.  Patient is alert and oriented at baseline.  Pain in right ear when pulling on ear lobe White coating on tongue  Assessment and Plan: 1. Recurrent acute suppurative otitis media of right ear without spontaneous rupture of tympanic membrane (Primary) - cefdinir  (OMNICEF ) 300 MG capsule; Take 1 capsule (300 mg total) by mouth 2 (two) times daily. 1 po BID  Dispense: 20 capsule; Refill: 0  2. Oral thrush - nystatin  (MYCOSTATIN ) 100000 UNIT/ML suspension; Take 5 mLs (500,000 Units total) by mouth 4 (four) times daily.  Dispense: 473 mL; Refill: 0  Stop Augmentin  and start Omnicef  Continue diflucan  and start nystatin   Probiotic Follow up up if symptoms worsen or do not improve    Follow Up Instructions: I discussed the assessment  and treatment plan with the patient. The patient was provided an opportunity to ask questions and all were answered. The patient agreed with the plan and demonstrated an understanding of the instructions.  A copy of instructions were sent to the patient via MyChart unless otherwise noted below.     The patient was advised to call back or seek an in-person evaluation if the symptoms worsen or if the condition fails to improve as anticipated.    Bari Learn, FNP

## 2024-09-22 ENCOUNTER — Telehealth: Payer: Self-pay

## 2024-09-22 NOTE — Telephone Encounter (Signed)
 Copied from CRM [id number]. Topic: Clinical - Medication Question >> Sep 22, 2024  3:34 PM Kevelyn M wrote: Reason for CRM: patient is asking if she should continue with the inhaler, since she was diagnosed with thrush and ear infection..  Call back #[phone]

## 2024-09-23 ENCOUNTER — Ambulatory Visit: Payer: Self-pay

## 2024-09-23 ENCOUNTER — Telehealth: Admitting: Family Medicine

## 2024-09-23 DIAGNOSIS — H9201 Otalgia, right ear: Secondary | ICD-10-CM

## 2024-09-23 DIAGNOSIS — B370 Candidal stomatitis: Secondary | ICD-10-CM

## 2024-09-23 NOTE — Progress Notes (Signed)
 Virtual Visit Consent   Janice Williams, you are scheduled for a virtual visit with a [place] provider today. Just as with appointments in the office, your consent must be obtained to participate. Your consent will be active for this visit and any virtual visit you may have with one of our providers in the next 365 days. If you have a MyChart account, a copy of this consent can be sent to you electronically.  As this is a virtual visit, video technology does not allow for your provider to perform a traditional examination. This may limit your provider's ability to fully assess your condition. If your provider identifies any concerns that need to be evaluated in person or the need to arrange testing (such as labs, EKG, etc.), we will make arrangements to do so. Although advances in technology are sophisticated, we cannot ensure that it will always work on either your end or our end. If the connection with a video visit is poor, the visit may have to be switched to a telephone visit. With either a video or telephone visit, we are not always able to ensure that we have a secure connection.  By engaging in this virtual visit, you consent to the provision of healthcare and authorize for your insurance to be billed (if applicable) for the services provided during this visit. Depending on your insurance coverage, you may receive a charge related to this service.  I need to obtain your verbal consent now. Are you willing to proceed with your visit today? Janice Williams has provided verbal consent on 09/23/2024 for a virtual visit (video or telephone). Janice CHRISTELLA Barefoot, NP  Date: 09/23/2024 3:21 PM   Virtual Visit via Video Note   I, Janice Williams, connected with  Janice Williams  ([id number], [birth year]) on 09/23/24 at  3:15 PM EDT by a video-enabled telemedicine application and verified that I am speaking with the correct person using two identifiers.  Location: Patient: Virtual Visit Location Patient:  Home Provider: Virtual Visit Location Provider: Home Office   I discussed the limitations of evaluation and management by telemedicine and the availability of in person appointments. The patient expressed understanding and agreed to proceed.    History of Present Illness: Janice Williams is a 63 y.o. who identifies as a female who was assigned female at birth, and is being seen today for persistent ear pain on the right.  Reports continued ear pain on the right, it seems to have improved some, but she is still having the pain and reporting tylenol  is not helping. Pain is 7/10 today, down from 9/10 on 09/21/24  She was Dx with ear otitis media on 9/30 and provided with Ciprodex- and Augmentin . She developed thrush (due to steroid inhaler use/abx)and was seen on 10/4 for this and given Diflucan .  She followed up on 10/5 due to on going ear pain and thrush. She was switched to Cefdinir  and started on Nystatin .  She reports the pain is a little better, but she just still does not feel well. Tylenol  is not helping.  She denies headache, URI symptoms, cough, sore throat, gi changes.   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   Pulmonary emphysema (HCC) 07/30/2024   Intractable nausea and vomiting 07/29/2024   Allergic rhinitis due to animal hair and dander 07/11/2024  Mild persistent asthma, uncomplicated 07/11/2024   Other chronic allergic conjunctivitis 07/11/2024   Other allergic rhinitis 07/11/2024   Vitamin D  deficiency 08/08/2023   Rash of hand 12/21/2022   Former cigarette smoker 11/16/2022   Sprain of calcaneofibular ligament 02/28/2022   Degenerative disc disease, cervical 10/13/2020   Osteoporosis 10/10/2019   Chronic obstructive pulmonary disease (HCC) 07/08/2019   Moderate protein-calorie malnutrition  05/31/2018   Hypertension 06/08/2017   Hyperlipidemia 06/03/2009   Human immunodeficiency virus (HIV) disease (HCC) 10/29/2008   GERD 10/29/2008   CEREBROVASCULAR ACCIDENT, HX OF 10/29/2008   Symptoms concerning nutrition, metabolism, and development 02/14/2007   Rhinitis, chronic 02/14/2007    Allergies:  Allergies  Allergen Reactions   Varenicline  Itching, Other (See Comments) and Dermatitis   Medications:  Current Outpatient Medications:    albuterol  (VENTOLIN  HFA) 108 (90 Base) MCG/ACT inhaler, Inhale 2 puffs into the lungs every 6 (six) hours as needed for wheezing or shortness of breath., Disp: 18 g, Rfl: 1   amLODipine  (NORVASC ) 10 MG tablet, Take 1 tablet (10 mg total) by mouth daily., Disp: 90 tablet, Rfl: 3   amoxicillin -clavulanate (AUGMENTIN ) 875-125 MG tablet, Take 1 tablet by mouth every 12 (twelve) hours for 7 days., Disp: 14 tablet, Rfl: 0   bictegravir-emtricitabine -tenofovir  AF (BIKTARVY ) 50-200-25 MG TABS tablet, Take 1 tablet by mouth daily., Disp: 30 tablet, Rfl: 2   cefdinir  (OMNICEF ) 300 MG capsule, Take 1 capsule (300 mg total) by mouth 2 (two) times daily. 1 po BID, Disp: 20 capsule, Rfl: 0   Ensure (ENSURE), Take 237 mLs by mouth 3 (three) times daily between meals., Disp: 237 mL, Rfl: 5   EPIPEN  2-PAK 0.3 MG/0.3ML SOAJ injection, , Disp: , Rfl:    fluconazole  (DIFLUCAN ) 200 MG tablet, Take 1 tablet (200 mg total) by mouth daily for 10 days., Disp: 10 tablet, Rfl: 0   fluticasone  (FLONASE ) 50 MCG/ACT nasal spray, Place 2 sprays into both nostrils daily., Disp: 16 g, Rfl: 6   levocetirizine (XYZAL ) 5 MG tablet, Take 1 tablet (5 mg total) by mouth every evening., Disp: 90 tablet, Rfl: 3   MIEBO 1.338 GM/ML SOLN, , Disp: , Rfl:    mirtazapine  (REMERON ) 15 MG tablet, Take 1 tablet (15 mg total) by mouth at bedtime., Disp: 30 tablet, Rfl: 11   nystatin  (MYCOSTATIN ) 100000 UNIT/ML suspension, Take 5 mLs (500,000 Units total) by mouth 4 (four) times daily., Disp: 473  mL, Rfl: 0   pantoprazole  (PROTONIX ) 40 MG tablet, Take 1 tablet (40 mg total) by mouth daily., Disp: 30 tablet, Rfl: 0   promethazine  (PHENERGAN ) 12.5 MG tablet, Take 1 tablet (12.5 mg total) by mouth every 6 (six) hours as needed for nausea or vomiting., Disp: 30 tablet, Rfl: 0   RESTASIS 0.05 % ophthalmic emulsion, 1 drop 2 (two) times daily., Disp: , Rfl:    rosuvastatin  (CRESTOR ) 40 MG tablet, Take 1 tablet (40 mg total) by mouth daily., Disp: 90 tablet, Rfl: 3   Tiotropium Bromide -Olodaterol 2.5-2.5 MCG/ACT AERS, Inhale 2 puffs into the lungs daily., Disp: 4 g, Rfl: 5   traMADol  (ULTRAM ) 50 MG tablet, Take 1 tablet (50 mg total) by mouth every 6 (six) hours as needed., Disp: 60 tablet, Rfl: 3  Observations/Objective: Patient is well-developed, well-nourished in no acute distress.  Resting comfortably  at home.  Head is normocephalic, atraumatic.  No labored breathing.  Speech is clear and coherent with logical content.  Patient is alert and oriented at baseline.    Assessment and  Plan:  1. Right ear pain (Primary) Continue cefdinir   2. Oral thrush Continue nystatin , thinks related to inhaler use- discussed mouthwash to rinse and gargle to help with this in future   No changes in ABX or nystatin . She can add motrin  to see if pain will improve, but otherwise she will need to be seen in person given the on going pain and recent treatments. Info on AVS  Reviewed side effects, risks and benefits of medication.    Patient acknowledged agreement and understanding of the plan.   Past Medical, Surgical, Social History, Allergies, and Medications have been Reviewed.   Follow Up Instructions: I discussed the assessment and treatment plan with the patient. The patient was provided an opportunity to ask questions and all were answered. The patient agreed with the plan and demonstrated an understanding of the instructions.  A copy of instructions were sent to the patient via MyChart  unless otherwise noted below.   The patient was advised to call back or seek an in-person evaluation if the symptoms worsen or if the condition fails to improve as anticipated.    Janice CHRISTELLA Barefoot, NP

## 2024-09-23 NOTE — Patient Instructions (Addendum)
 Janice Williams, thank you for joining [name], NP for today's virtual visit.  While this provider is not your primary care provider (PCP), if your PCP is located in our provider database this encounter information will be shared with them immediately following your visit.   A [facility] MyChart account gives you access to today's visit and all your visits, tests, and labs performed at [facility]  click here if you don't have a [facility] MyChart account or go to [email or web address]  Consent: (Patient) Janice Williams provided verbal consent for this virtual visit at the beginning of the encounter.  Current Medications:  Current Outpatient Medications:    albuterol  (VENTOLIN  HFA) 108 (90 Base) MCG/ACT inhaler, Inhale 2 puffs into the lungs every 6 (six) hours as needed for wheezing or shortness of breath., Disp: 18 g, Rfl: 1   amLODipine  (NORVASC ) 10 MG tablet, Take 1 tablet (10 mg total) by mouth daily., Disp: 90 tablet, Rfl: 3   amoxicillin -clavulanate (AUGMENTIN ) 875-125 MG tablet, Take 1 tablet by mouth every 12 (twelve) hours for 7 days., Disp: 14 tablet, Rfl: 0   bictegravir-emtricitabine -tenofovir  AF (BIKTARVY ) 50-200-25 MG TABS tablet, Take 1 tablet by mouth daily., Disp: 30 tablet, Rfl: 2   cefdinir  (OMNICEF ) 300 MG capsule, Take 1 capsule (300 mg total) by mouth 2 (two) times daily. 1 po BID, Disp: 20 capsule, Rfl: 0   Ensure (ENSURE), Take 237 mLs by mouth 3 (three) times daily between meals., Disp: 237 mL, Rfl: 5   EPIPEN  2-PAK 0.3 MG/0.3ML SOAJ injection, , Disp: , Rfl:    fluconazole  (DIFLUCAN ) 200 MG tablet, Take 1 tablet (200 mg total) by mouth daily for 10 days., Disp: 10 tablet, Rfl: 0   fluticasone  (FLONASE ) 50 MCG/ACT nasal spray, Place 2 sprays into both nostrils daily., Disp: 16 g, Rfl: 6   levocetirizine (XYZAL ) 5 MG tablet, Take 1 tablet (5 mg total) by mouth every evening., Disp: 90 tablet, Rfl: 3   MIEBO 1.338 GM/ML SOLN, , Disp: , Rfl:     mirtazapine  (REMERON ) 15 MG tablet, Take 1 tablet (15 mg total) by mouth at bedtime., Disp: 30 tablet, Rfl: 11   nystatin  (MYCOSTATIN ) 100000 UNIT/ML suspension, Take 5 mLs (500,000 Units total) by mouth 4 (four) times daily., Disp: 473 mL, Rfl: 0   pantoprazole  (PROTONIX ) 40 MG tablet, Take 1 tablet (40 mg total) by mouth daily., Disp: 30 tablet, Rfl: 0   promethazine  (PHENERGAN ) 12.5 MG tablet, Take 1 tablet (12.5 mg total) by mouth every 6 (six) hours as needed for nausea or vomiting., Disp: 30 tablet, Rfl: 0   RESTASIS 0.05 % ophthalmic emulsion, 1 drop 2 (two) times daily., Disp: , Rfl:    rosuvastatin  (CRESTOR ) 40 MG tablet, Take 1 tablet (40 mg total) by mouth daily., Disp: 90 tablet, Rfl: 3   Tiotropium Bromide -Olodaterol 2.5-2.5 MCG/ACT AERS, Inhale 2 puffs into the lungs daily., Disp: 4 g, Rfl: 5   traMADol  (ULTRAM ) 50 MG tablet, Take 1 tablet (50 mg total) by mouth every 6 (six) hours as needed., Disp: 60 tablet, Rfl: 3   Medications ordered in this encounter:  No orders of the defined types were placed in this encounter.    *If you need refills on other medications prior to your next appointment, please contact your pharmacy*  Follow-Up: Call back or seek an in-person evaluation if the symptoms worsen or if the condition fails to improve as anticipated.  [facility] [phone]  Other Instructions  Oral Thrush, Adult Oral thrush is an infection in your mouth and throat and on your tongue. It causes white patches to form in your mouth and on your tongue. Many cases of thrush are mild. But, sometimes, thrush can be serious. People who have a weak body defense system (immune system) or other diseases can be affected more. What are the causes? This condition is caused by a type of fungus called yeast. The fungus is normally present in small amounts in the mouth and nose. If a person has a long-term illness or a weak body defense system, the fungus can grow and spread  quickly. This causes thrush. What increases the risk? You are more likely to develop this condition if: You have a weak body defense system. You are an older adult. You have diabetes, cancer, or HIV. You have a dry mouth. You are pregnant or breastfeeding. You do not take good care of your teeth. This risk is greater for people who have false teeth (dentures). You use antibiotic or steroid medicines. What are the signs or symptoms? Symptoms of this condition include: A burning feeling in the mouth and throat. White patches that stick to the mouth and tongue. A bad taste in the mouth or trouble tasting foods. A feeling like you have cotton in your mouth. Pain when you eat and swallow. Not wanting to eat as much as usual. Cracking at the corners of the mouth. How is this treated? This condition is treated with medicines called antifungals. These medicines prevent a fungus from growing. The medicines are either put right on the area (topical) or swallowed (oral). Your doctor will also treat other problems that you may have, such as diabetes or HIV. Follow these instructions at home: Helping with pain and soreness To lessen your pain: Drink cold liquids, like water and iced tea. Eat frozen ice pops or frozen juices. Eat foods that are easy to swallow, like gelatin and ice cream. Drink from a straw if you have too much pain in your mouth.  General instructions Take or use over-the-counter and prescription medicines only as told by your doctor. Eat plain yogurt that has live cultures in it. Read the label to make sure that there are live cultures in your yogurt. If you wear false teeth: Take them out before you go to bed. Brush them well. Soak them in a cleaner. Rinse your mouth with warm salt-water many times a day. To make the salt-water mixture, dissolve -1 teaspoon (3-6 g) of salt in 1 cup (237 mL) of warm water. Contact a doctor if: Your problems do not get better within 7 days  of treatment. Your infection is spreading. This may show as white areas on the skin outside of your mouth. You are nursing your baby and you have redness and pain in the nipples. Summary Oral thrush is an infection in your mouth and throat. It is caused by a fungus. You are more likely to get this condition if you have a weak body defense system. Diseases like diabetes, cancer, or HIV also add to your risk. This condition is treated with medicines called antifungals. Contact a doctor if you do not get better within 7 days of starting treatment. This information is not intended to replace advice given to you by your health care provider. Make sure you discuss any questions you have with your health care provider. Document Revised: 11/20/2022 Document Reviewed: 11/20/2022 Elsevier Patient Education  2024 [employer].   If you have been  instructed to have an in-person evaluation today at a local Urgent Care facility, please use the link below. It will take you to a list of all of our available [facility], including address, phone number and hours of operation. Please do not delay care.  [facility]  If you or a family member do not have a primary care provider, use the link below to schedule a visit and establish care. When you choose a [facility] primary care physician or advanced practice provider, you gain a long-term partner in health. Find a Primary Care Provider  Learn more about [facility]'s in-office and virtual care options: [facility] - Get Care Now

## 2024-09-23 NOTE — Telephone Encounter (Signed)
 FYI Only or Action Required?: FYI only for provider.  Patient was last seen in primary care on 09/21/2024 by Lavell Bari LABOR, FNP.  Called Nurse Triage reporting Otalgia.  Symptoms began a week ago.  Interventions attempted: OTC medications: Tylenol .  Symptoms are: gradually worsening.  Triage Disposition: See HCP Within 4 Hours (Or PCP Triage)  Patient/caregiver understands and will follow disposition?: Yes    Copied from CRM #[id number]. Topic: Clinical - Red Word Triage >> Sep 23, 2024 12:11 PM Larissa S wrote: [facility] that prompted transfer to Nurse Triage: thrush- worsening, Rt ear pain Reason for Disposition  [1] SEVERE pain (e.g., excruciating) and [2] not improved 2 hours after pain medicine (e.g., acetaminophen  or ibuprofen )  Answer Assessment - Initial Assessment Questions Currently using ear drops, nystatin ,Tylenol  and antibiotics for symptoms. Patient states she has been using a new inhaler, and on fluconazole  for thrush patient is really concerned about the ear pain. Offered an appointment in office and patient declined due to lack of transportation. Made a virtual UC appointment for patient.    1. LOCATION: Which ear is involved?     R ear pain  2. ONSET: When did the ear pain start?      Last week Tuesday  3. SEVERITY: How bad is the pain?  (Scale 1-10; mild, moderate or severe)     Severe  4. URI SYMPTOMS: Do you have a runny nose or cough?     Dry cough every now and then  5. FEVER: Do you have a fever? If Yes, ask: What is your temperature, how was it measured, and when did it start?     Denies  6. OTHER SYMPTOMS: Do you have any other symptoms? (e.g., decreased hearing, dizziness, headache, stiff neck, vomiting)     Thrush on tongue, headache on one side  Protocols used: Earache-A-AH

## 2024-09-24 NOTE — Telephone Encounter (Signed)
 Please advise patient.

## 2024-09-26 ENCOUNTER — Telehealth: Admitting: Family Medicine

## 2024-09-26 ENCOUNTER — Ambulatory Visit (HOSPITAL_COMMUNITY)
Admission: RE | Admit: 2024-09-26 | Discharge: 2024-09-26 | Disposition: A | Discharge status: Home / Self Care | Source: Ambulatory Visit | Attending: Oral Surgery | Admitting: Oral Surgery

## 2024-09-26 DIAGNOSIS — H9201 Otalgia, right ear: Secondary | ICD-10-CM

## 2024-09-26 DIAGNOSIS — M26633 Articular disc disorder of bilateral temporomandibular joint: Secondary | ICD-10-CM | POA: Diagnosis present

## 2024-09-26 NOTE — Patient Instructions (Signed)
 Probiotics: What to Know Probiotics are good bacteria and yeasts that help keep your gut healthy and fight off bad germs. Your health care provider may tell you to take a probiotic if you are on antibiotics or have certain health problems. Probiotics may help with: Diarrhea, which is watery poop. Trouble pooping (constipation). Irritable bowel syndrome (IBS). Infections, such as: Lung infections. Yeast infections. Urinary tract infections. Skin problems, like acne or eczema. Mood and mental health. What affects the balance of bacteria in my body? The balance of good germs can change because of: Antibiotics. These medicines fight infections but can also kill good bacteria. Certain health problems such as: Infections. Irritable bowel syndrome (IBS). Stomach ulcers. Tooth decay and gum disease. Stress. Feeling stressed can affect your body. A low-fiber diet. Not eating enough fiber can hurt good bacteria. What type of probiotic is right for me? Probiotics have different types of good bacteria called strains. Common strains include: Lactobacillus. Saccharomyces. Bifidobacterium. Some strains work better for certain health issues. Ask your provider which strain is best for you and how often to take it. Probiotics come in many forms, like pills or powders, and some need to be kept in the refrigerator. You can also find them in foods like yogurt, cottage cheese, kimchi, and tempeh. What are the side effects of probiotics? Some people may feel side effects when taking them. These side effects are usually temporary and can include: Gas. Bloating or feeling full. Cramps. Serious side effects are rare. Other tips:  Eat foods high in fiber, such as whole grains, beans, and vegetables. These foods help good bacteria grow. If you are taking probiotics with antibiotics, take them as told by your provider. You may need to take probiotics for many weeks. This is to help good bacteria grow in  your gut. Avoid certain foods as told. When buying probiotics: Check expiration dates. Probiotics are live, so they can lose their effectiveness over time. Look for products that have been tested for quality and contain live, active cultures. Always check the label for how to store and use them. Where to find more information International Scientific Association for Probiotics and Prebiotics: isappscience.org This information is not intended to replace advice given to you by your health care provider. Make sure you discuss any questions you have with your health care provider. Document Revised: 02/01/2024 Document Reviewed: 02/01/2024 Elsevier Patient Education  2025 [employer].

## 2024-09-26 NOTE — Telephone Encounter (Signed)
 I called patient twice and no answer. If inhaler is helping her COPD symptoms, she should continue inhaler use. Make sure to rinse mouth thoroughly with water first then mouthwash after using inhaler. The steroid component of the inhaler can put you at a higher risk for fungal infections such as oral thrush.

## 2024-09-26 NOTE — Progress Notes (Signed)
 Virtual Visit Consent   Janice Williams, you are scheduled for a virtual visit with a [facility] provider today. Just as with appointments in the office, your consent must be obtained to participate. Your consent will be active for this visit and any virtual visit you may have with one of our providers in the next 365 days. If you have a MyChart account, a copy of this consent can be sent to you electronically.  As this is a virtual visit, video technology does not allow for your provider to perform a traditional examination. This may limit your provider's ability to fully assess your condition. If your provider identifies any concerns that need to be evaluated in person or the need to arrange testing (such as labs, EKG, etc.), we will make arrangements to do so. Although advances in technology are sophisticated, we cannot ensure that it will always work on either your end or our end. If the connection with a video visit is poor, the visit may have to be switched to a telephone visit. With either a video or telephone visit, we are not always able to ensure that we have a secure connection.  By engaging in this virtual visit, you consent to the provision of healthcare and authorize for your insurance to be billed (if applicable) for the services provided during this visit. Depending on your insurance coverage, you may receive a charge related to this service.  I need to obtain your verbal consent now. Are you willing to proceed with your visit today? Janice Williams has provided verbal consent on 09/26/2024 for a virtual visit (video or telephone). Loa Lamp, FNP  Date: 09/26/2024 5:18 PM   Virtual Visit via Video Note   I, Loa Lamp, connected with  Janice Williams  ([id number], 05/30/1961) on 09/26/24 at  5:15 PM EDT by a video-enabled telemedicine application and verified that I am speaking with the correct person using two identifiers.  Location: Patient: Virtual Visit Location Patient:  Home Provider: Virtual Visit Location Provider: Home Office   I discussed the limitations of evaluation and management by telemedicine and the availability of in person appointments. The patient expressed understanding and agreed to proceed.    History of Present Illness: Janice Williams is a 63 y.o. who identifies as a female who was assigned female at birth, and is being seen today for rt ear pain resolved. She wants us  to know she has taken it for 6 days and it has caused her nausea and diarrhea and that she is stopping the med. She says pain is gone but she can't take the GI sx anymore and does not want another atb. She says she has had too many lately.   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   Pulmonary emphysema (HCC) 07/30/2024   Intractable nausea and vomiting 07/29/2024   Allergic rhinitis due to animal hair and dander 07/11/2024   Mild persistent asthma, uncomplicated 07/11/2024   Other chronic allergic conjunctivitis 07/11/2024   Other allergic rhinitis 07/11/2024   Vitamin D  deficiency 08/08/2023   Rash of hand 12/21/2022   Former cigarette smoker 11/16/2022   Sprain of calcaneofibular ligament 02/28/2022   Degenerative disc disease, cervical 10/13/2020   Osteoporosis 10/10/2019   Chronic obstructive pulmonary disease (HCC) 07/08/2019   Moderate protein-calorie malnutrition 05/31/2018  Hypertension 06/08/2017   Hyperlipidemia 06/03/2009   Human immunodeficiency virus (HIV) disease (HCC) 10/29/2008   GERD 10/29/2008   CEREBROVASCULAR ACCIDENT, HX OF 10/29/2008   Symptoms concerning nutrition, metabolism, and development 02/14/2007   Rhinitis, chronic 02/14/2007    Allergies:  Allergies  Allergen Reactions   Varenicline  Itching, Other (See Comments) and Dermatitis   Medications:   Current Outpatient Medications:    albuterol  (VENTOLIN  HFA) 108 (90 Base) MCG/ACT inhaler, Inhale 2 puffs into the lungs every 6 (six) hours as needed for wheezing or shortness of breath., Disp: 18 g, Rfl: 1   amLODipine  (NORVASC ) 10 MG tablet, Take 1 tablet (10 mg total) by mouth daily., Disp: 90 tablet, Rfl: 3   bictegravir-emtricitabine -tenofovir  AF (BIKTARVY ) 50-200-25 MG TABS tablet, Take 1 tablet by mouth daily., Disp: 30 tablet, Rfl: 2   cefdinir  (OMNICEF ) 300 MG capsule, Take 1 capsule (300 mg total) by mouth 2 (two) times daily. 1 po BID, Disp: 20 capsule, Rfl: 0   Ensure (ENSURE), Take 237 mLs by mouth 3 (three) times daily between meals., Disp: 237 mL, Rfl: 5   EPIPEN  2-PAK 0.3 MG/0.3ML SOAJ injection, , Disp: , Rfl:    fluconazole  (DIFLUCAN ) 200 MG tablet, Take 1 tablet (200 mg total) by mouth daily for 10 days., Disp: 10 tablet, Rfl: 0   fluticasone  (FLONASE ) 50 MCG/ACT nasal spray, Place 2 sprays into both nostrils daily., Disp: 16 g, Rfl: 6   levocetirizine (XYZAL ) 5 MG tablet, Take 1 tablet (5 mg total) by mouth every evening., Disp: 90 tablet, Rfl: 3   MIEBO 1.338 GM/ML SOLN, , Disp: , Rfl:    mirtazapine  (REMERON ) 15 MG tablet, Take 1 tablet (15 mg total) by mouth at bedtime., Disp: 30 tablet, Rfl: 11   nystatin  (MYCOSTATIN ) 100000 UNIT/ML suspension, Take 5 mLs (500,000 Units total) by mouth 4 (four) times daily., Disp: 473 mL, Rfl: 0   pantoprazole  (PROTONIX ) 40 MG tablet, Take 1 tablet (40 mg total) by mouth daily., Disp: 30 tablet, Rfl: 0   promethazine  (PHENERGAN ) 12.5 MG tablet, Take 1 tablet (12.5 mg total) by mouth every 6 (six) hours as needed for nausea or vomiting., Disp: 30 tablet, Rfl: 0   RESTASIS 0.05 % ophthalmic emulsion, 1 drop 2 (two) times daily., Disp: , Rfl:    rosuvastatin  (CRESTOR ) 40 MG tablet, Take 1 tablet (40 mg total) by mouth daily., Disp: 90 tablet, Rfl: 3   Tiotropium Bromide -Olodaterol 2.5-2.5 MCG/ACT AERS, Inhale 2 puffs into the lungs daily.,  Disp: 4 g, Rfl: 5   traMADol  (ULTRAM ) 50 MG tablet, Take 1 tablet (50 mg total) by mouth every 6 (six) hours as needed., Disp: 60 tablet, Rfl: 3  Observations/Objective: Patient is well-developed, well-nourished in no acute distress.  Resting comfortably  at home.  Head is normocephalic, atraumatic.  No labored breathing.  Speech is clear and coherent with logical content.  Patient is alert and oriented at baseline.    Assessment and Plan: 1. Right ear pain (Primary)  Follow up at urgent care if sx reoccur. Increase fluids. Yogurt, probiotic.   Follow Up Instructions: I discussed the assessment and treatment plan with the patient. The patient was provided an opportunity to ask questions and all were answered. The patient agreed with the plan and demonstrated an understanding of the instructions.  A copy of instructions were sent to the patient via MyChart unless otherwise noted below.     The patient was advised to call back or seek an in-person evaluation if the  symptoms worsen or if the condition fails to improve as anticipated.    Stanislav Gervase, FNP

## 2024-09-29 ENCOUNTER — Other Ambulatory Visit: Payer: Self-pay

## 2024-09-29 MED ORDER — PANTOPRAZOLE SODIUM 40 MG PO TBEC: 40.0000 mg | DELAYED_RELEASE_TABLET | Freq: Every day | ORAL | 2 refills | Status: DC

## 2024-10-01 ENCOUNTER — Encounter (INDEPENDENT_AMBULATORY_CARE_PROVIDER_SITE_OTHER): Payer: Self-pay

## 2024-10-02 ENCOUNTER — Other Ambulatory Visit: Payer: Self-pay

## 2024-10-02 NOTE — Progress Notes (Signed)
 Specialty Pharmacy Refill Coordination Note  Janice Williams is a 63 y.o. female contacted today regarding refills of specialty medication(s) Bictegravir-Emtricitab-Tenofov (Biktarvy )   Patient requested (Patient-Rptd) Delivery   Delivery date: 10/03/24   Verified address: (Patient-Rptd) [address]   Medication will be filled on 10/02/24.

## 2024-10-07 ENCOUNTER — Other Ambulatory Visit (HOSPITAL_COMMUNITY): Payer: Self-pay

## 2024-10-07 ENCOUNTER — Telehealth: Payer: Self-pay | Admitting: Internal Medicine

## 2024-10-07 ENCOUNTER — Telehealth: Payer: Self-pay | Admitting: *Deleted

## 2024-10-07 NOTE — Telephone Encounter (Signed)
 Gillie called requesting to speak with Dr. Luiz or a nurse regarding her Tramadol  medication. Pt stated they are charging $18. I asked [facility] to run a test and she stated it would be $4 with Medicaid. I confirmed with pt they do have her medicaid on file but were still charging $18 and she stated that was not affordable. Pt can be reached at  [phone].

## 2024-10-07 NOTE — Telephone Encounter (Signed)
 I contacted [facility] to verify the claim. Per the pharmacy, their Medicaid system is currently down and experiencing a technical glitch. As a result, the correct pricing is not being applied at this time. Patient was informed of the situation and advised to call the pharmacy back later to confirm once the system is functioning properly.

## 2024-10-07 NOTE — Telephone Encounter (Signed)
 Mammogram appointemnt 09-09-2024  at 8:30 am  / canceled .

## 2024-10-22 ENCOUNTER — Ambulatory Visit

## 2024-10-22 ENCOUNTER — Other Ambulatory Visit: Payer: Self-pay

## 2024-10-22 VITALS — BP 127/79 | HR 93 | Temp 98.4°F | Resp 16 | Ht 63.0 in | Wt 91.6 lb

## 2024-10-22 DIAGNOSIS — J439 Emphysema, unspecified: Secondary | ICD-10-CM

## 2024-10-22 DIAGNOSIS — B20 Human immunodeficiency virus [HIV] disease: Secondary | ICD-10-CM

## 2024-10-22 DIAGNOSIS — R630 Anorexia: Secondary | ICD-10-CM

## 2024-10-22 DIAGNOSIS — R634 Abnormal weight loss: Secondary | ICD-10-CM

## 2024-10-22 MED ORDER — ENSURE PO LIQD
237.0000 mL | Freq: Three times a day (TID) | ORAL | 5 refills | Status: AC
Start: 2024-10-22 — End: ?

## 2024-10-22 MED ORDER — MEGESTROL ACETATE 400 MG/10ML PO SUSP: 400.0000 mg | Freq: Every day | ORAL | 1 refills | Status: DC

## 2024-10-22 NOTE — Progress Notes (Signed)
 Rx faxed to [facility].   Jesusita Jocelyn, BSN, RN

## 2024-10-22 NOTE — Progress Notes (Unsigned)
     Patient ID: Janice Williams, female    DOB: [birth year]  MRN: [id number]  CC: Medical Management of Chronic Issues   Subjective: Janice Williams is a 63 y.o. female with past medical history of HIV and COPD who presents to clinic for follow-up.  Patient reports that she is doing well with medication change for COPD.  Reports less episodes of shortness of breath and flareups. Patient's main concern is poor appetite and inability to gain weight despite sufficient food intake.  Reports she drinks 3 ensures 3 times a day.      Allergies  Allergen Reactions   Varenicline  Itching, Other (See Comments) and Dermatitis    ROS: Review of Systems Negative except as stated above  PHYSICAL EXAM: BP 127/79   Pulse 93   Temp 98.4 F (36.9 C) (Oral)   Resp 16   Ht 5' 3 (1.6 m)   Wt 91 lb 9.6 oz (41.5 kg)   SpO2 94%   BMI 16.23 kg/m   Physical Exam  General: well-appearing, no acute distress Skin: no jaundice, rashes, or lesions Cardiovascular: regular heart rate and rhythm, normal S1/S2, no murmurs, gallops, or rubs, peripheral pulses 2+ bilaterally Chest: no skeletal deformity, lungs clear to auscultation bilaterally, equal breath sounds bilaterally Abdomen: soft, non-distended, non-tender to palpation, no hepatomegaly, no splenomegaly, normoactive bowel sounds Musculoskeletal: normal gait Extremities: no peripheral edema  ASSESSMENT AND PLAN:  1. Appetite impaired (Primary) -Encouraged increased intake of proteins and lean meats.  - megestrol  (MEGACE ) 400 MG/10ML suspension; Take 10 mLs (400 mg total) by mouth daily.  Dispense: 240 mL; Refill: 1 - Plan to follow-up in a month to assess effects of medication.  2. Chronic obstructive pulmonary disease with emphysema, unspecified emphysema type (HCC) - Well controlled. - Continue Tiotropium/Olodaterol 2.5-2.5 mcg/act    Patient was given the opportunity to ask questions.  Patient verbalized understanding of the plan and was  able to repeat key elements of the plan.    No orders of the defined types were placed in this encounter.    Requested Prescriptions   Signed Prescriptions Disp Refills   megestrol  (MEGACE ) 400 MG/10ML suspension 240 mL 1    Sig: Take 10 mLs (400 mg total) by mouth daily.    Return in about 1 month (around 11/21/2024) for follow-up low appetite.  Sula Leavy Rode, PA-C

## 2024-10-23 ENCOUNTER — Other Ambulatory Visit: Payer: Self-pay

## 2024-10-23 DIAGNOSIS — R630 Anorexia: Secondary | ICD-10-CM | POA: Insufficient documentation

## 2024-10-29 ENCOUNTER — Other Ambulatory Visit: Payer: Self-pay

## 2024-10-29 NOTE — Progress Notes (Signed)
 Specialty Pharmacy Refill Coordination Note  Janice Williams is a 63 y.o. female contacted today regarding refills of specialty medication(s) Bictegravir-Emtricitab-Tenofov (Biktarvy )   Patient requested Delivery   Delivery date: 11/05/24   Verified address: [address]   Medication will be filled on: 11/04/24

## 2024-10-29 NOTE — Progress Notes (Signed)
 Specialty Pharmacy Ongoing Clinical Assessment Note  Janice Williams is a 63 y.o. female who is being followed by the specialty pharmacy service for RxSp HIV   Patient's specialty medication(s) reviewed today: Bictegravir-Emtricitab-Tenofov (Biktarvy )   Missed doses in the last 4 weeks: 0   Patient/Caregiver did not have any additional questions or concerns.   Therapeutic benefit summary: Patient is achieving benefit   Adverse events/side effects summary: No adverse events/side effects   Patient's therapy is appropriate to: Continue    Goals Addressed             This Visit's Progress    Achieve Undetectable HIV Viral Load < 20   On track    Patient is on track. Patient will maintain adherence         Follow up: 12 months  [name] M Matt Delpizzo Specialty Pharmacist

## 2024-10-31 ENCOUNTER — Telehealth (INDEPENDENT_AMBULATORY_CARE_PROVIDER_SITE_OTHER): Admitting: Internal Medicine

## 2024-10-31 DIAGNOSIS — B20 Human immunodeficiency virus [HIV] disease: Secondary | ICD-10-CM | POA: Diagnosis present

## 2024-10-31 DIAGNOSIS — K219 Gastro-esophageal reflux disease without esophagitis: Secondary | ICD-10-CM

## 2024-10-31 DIAGNOSIS — R636 Underweight: Secondary | ICD-10-CM

## 2024-10-31 DIAGNOSIS — R630 Anorexia: Secondary | ICD-10-CM

## 2024-10-31 DIAGNOSIS — G894 Chronic pain syndrome: Secondary | ICD-10-CM | POA: Diagnosis not present

## 2024-10-31 MED ORDER — PANTOPRAZOLE SODIUM 40 MG PO TBEC: 40.0000 mg | DELAYED_RELEASE_TABLET | Freq: Every day | ORAL | 2 refills | Status: AC

## 2024-10-31 MED ORDER — MEGESTROL ACETATE 400 MG/10ML PO SUSP: 400.0000 mg | Freq: Every day | ORAL | 1 refills | Status: DC

## 2024-10-31 NOTE — Progress Notes (Signed)
  Virtual Visit via Video Note  I connected with Janice Williams on 10/31/24 at  9:00 AM EST by a video enabled telemedicine application and verified that I am speaking with the correct person using two identifiers.  Location: Patient: at home Provider: at clinic   I discussed the limitations of evaluation and management by telemedicine and the availability of in person appointments. The patient expressed understanding and agreed to proceed.  History of Present Illness: Patient is upset today since her medical ride cancelled last minute and she wanted to come in person to appointment. She reports that she is otherwise doing okay. She has been taking her HIV medications regularly, and not missing doses. She denies recent illnesses. She reports that she was restarted on megace  by her pcp and states that she has noticed improvement.    Observations/Objective: Gen = a x o by 3 in nad HEENT = PERRLA EOMI no scleral icterus Skin = no rash  Labs: cd 4 count  509 and VL 50 -- August 2025  Assessment and Plan: HIV disease= doing well; continue with biktarvy ; will get labs at her next visit in person Health maintenance = recommend to get rsv vaccine Underweight = refill on megace  to help with keep weight up Gerd= still has symptoms, improved with PPI; refill on protonix   Chronic pain = tramadol  refill will be needed in 7-8 days  Follow Up Instructions: Will see back in 3 wk   I discussed the assessment and treatment plan with the patient. The patient was provided an opportunity to ask questions and all were answered. The patient agreed with the plan and demonstrated an understanding of the instructions.   The patient was advised to call back or seek an in-person evaluation if the symptoms worsen or if the condition fails to improve as anticipated.  I personally spent a total of 25 minutes in the care of the patient today including preparing to see the patient, getting/reviewing separately  obtained history, counseling and educating, placing orders, documenting clinical information in the EHR, independently interpreting results, and communicating results.   Montie Bologna, MD

## 2024-11-03 ENCOUNTER — Ambulatory Visit: Admitting: Internal Medicine

## 2024-11-04 ENCOUNTER — Other Ambulatory Visit: Payer: Self-pay

## 2024-11-05 ENCOUNTER — Telehealth: Admitting: Physician Assistant

## 2024-11-05 DIAGNOSIS — B370 Candidal stomatitis: Secondary | ICD-10-CM

## 2024-11-05 MED ORDER — NYSTATIN 100000 UNIT/ML MT SUSP: 5.0000 mL | Freq: Four times a day (QID) | OROMUCOSAL | 0 refills | Status: DC

## 2024-11-05 NOTE — Progress Notes (Signed)
 Virtual Visit Consent   Janice Williams, you are scheduled for a virtual visit with a [facility] provider today. Just as with appointments in the office, your consent must be obtained to participate. Your consent will be active for this visit and any virtual visit you may have with one of our providers in the next 365 days. If you have a MyChart account, a copy of this consent can be sent to you electronically.  As this is a virtual visit, video technology does not allow for your provider to perform a traditional examination. This may limit your provider's ability to fully assess your condition. If your provider identifies any concerns that need to be evaluated in person or the need to arrange testing (such as labs, EKG, etc.), we will make arrangements to do so. Although advances in technology are sophisticated, we cannot ensure that it will always work on either your end or our end. If the connection with a video visit is poor, the visit may have to be switched to a telephone visit. With either a video or telephone visit, we are not always able to ensure that we have a secure connection.  By engaging in this virtual visit, you consent to the provision of healthcare and authorize for your insurance to be billed (if applicable) for the services provided during this visit. Depending on your insurance coverage, you may receive a charge related to this service.  I need to obtain your verbal consent now. Are you willing to proceed with your visit today? Janice Williams has provided verbal consent on 11/05/2024 for a virtual visit (video or telephone). Delon CHRISTELLA Dickinson, PA-C  Date: 11/05/2024 4:28 PM   Virtual Visit via Video Note   I, Delon CHRISTELLA Dickinson, connected with  Janice Williams  ([id number], 1961-06-13) on 11/05/24 at  4:15 PM EST by a video-enabled telemedicine application and verified that I am speaking with the correct person using two identifiers.  Location: Patient: Virtual Visit  Location Patient: Home Provider: Virtual Visit Location Provider: Home Office   I discussed the limitations of evaluation and management by telemedicine and the availability of in person appointments. The patient expressed understanding and agreed to proceed.    History of Present Illness: Janice Williams is a 63 y.o. who identifies as a female who was assigned female at birth, and is being seen today for possible thrush. 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 She did have thrush in 09/2024 and was treated with Nystatin .    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   Pulmonary emphysema (HCC) 07/30/2024   Intractable nausea and vomiting 07/29/2024   Allergic rhinitis due to animal hair and dander 07/11/2024   Mild persistent asthma, uncomplicated 07/11/2024   Other chronic allergic conjunctivitis 07/11/2024   Other allergic rhinitis 07/11/2024   Vitamin D  deficiency 08/08/2023   Rash of hand 12/21/2022   Former cigarette smoker 11/16/2022   Sprain of calcaneofibular ligament 02/28/2022   Degenerative disc disease, cervical 10/13/2020   Osteoporosis 10/10/2019   Chronic obstructive pulmonary disease (HCC) 07/08/2019   Moderate protein-calorie malnutrition 05/31/2018   Hypertension  06/08/2017   Hyperlipidemia 06/03/2009   Human immunodeficiency virus (HIV) disease (HCC) 10/29/2008   GERD 10/29/2008   CEREBROVASCULAR ACCIDENT, HX OF 10/29/2008   Symptoms concerning nutrition, metabolism, and development 02/14/2007   Rhinitis, chronic 02/14/2007    Allergies:  Allergies  Allergen Reactions   Varenicline   Itching, Other (See Comments) and Dermatitis   Medications:  Current Outpatient Medications:    nystatin  (MYCOSTATIN ) 100000 UNIT/ML suspension, Take 5 mLs (500,000 Units total) by mouth 4 (four) times daily., Disp: 240 mL, Rfl: 0   albuterol  (VENTOLIN  HFA) 108 (90 Base) MCG/ACT inhaler, Inhale 2 puffs into the lungs every 6 (six) hours as needed for wheezing or shortness of breath., Disp: 18 g, Rfl: 1   amLODipine  (NORVASC ) 10 MG tablet, Take 1 tablet (10 mg total) by mouth daily., Disp: 90 tablet, Rfl: 3   bictegravir-emtricitabine -tenofovir  AF (BIKTARVY ) 50-200-25 MG TABS tablet, Take 1 tablet by mouth daily., Disp: 30 tablet, Rfl: 2   cefdinir  (OMNICEF ) 300 MG capsule, Take 1 capsule (300 mg total) by mouth 2 (two) times daily. 1 po BID (Patient not taking: Reported on 10/22/2024), Disp: 20 capsule, Rfl: 0   Ensure (ENSURE), Take 237 mLs by mouth 3 (three) times daily between meals., Disp: 237 mL, Rfl: 5   EPIPEN  2-PAK 0.3 MG/0.3ML SOAJ injection, , Disp: , Rfl:    fluticasone  (FLONASE ) 50 MCG/ACT nasal spray, Place 2 sprays into both nostrils daily., Disp: 16 g, Rfl: 6   levocetirizine (XYZAL ) 5 MG tablet, Take 1 tablet (5 mg total) by mouth every evening., Disp: 90 tablet, Rfl: 3   megestrol  (MEGACE ) 400 MG/10ML suspension, Take 10 mLs (400 mg total) by mouth daily., Disp: 240 mL, Rfl: 1   MIEBO 1.338 GM/ML SOLN, , Disp: , Rfl:    pantoprazole  (PROTONIX ) 40 MG tablet, Take 1 tablet (40 mg total) by mouth daily., Disp: 30 tablet, Rfl: 2   promethazine  (PHENERGAN ) 12.5 MG tablet, Take 1 tablet (12.5 mg total) by mouth every 6 (six) hours as needed for nausea or vomiting., Disp: 30 tablet, Rfl: 0   RESTASIS 0.05 % ophthalmic emulsion, 1 drop 2 (two) times daily., Disp: , Rfl:    rosuvastatin  (CRESTOR ) 40 MG tablet, Take 1 tablet (40 mg total) by mouth daily., Disp: 90 tablet, Rfl: 3   Tiotropium Bromide -Olodaterol 2.5-2.5 MCG/ACT AERS, Inhale 2 puffs into the lungs daily., Disp: 4 g, Rfl: 5    traMADol  (ULTRAM ) 50 MG tablet, Take 1 tablet (50 mg total) by mouth every 6 (six) hours as needed., Disp: 60 tablet, Rfl: 3  Observations/Objective: Patient is well-developed, well-nourished in no acute distress.  Resting comfortably at home.  Head is normocephalic, atraumatic.  No labored breathing.  Speech is clear and coherent with logical content.  Patient is alert and oriented at baseline.    Assessment and Plan: 1. Thrush (Primary) - nystatin  (MYCOSTATIN ) 100000 UNIT/ML suspension; Take 5 mLs (500,000 Units total) by mouth 4 (four) times daily.  Dispense: 240 mL; Refill: 0  - Nystatin  refilled - Can swish and spit every 6 hours for 2 weeks - Salt water gargles - Seek in person evaluation if worsening or not improving  Follow Up Instructions: I discussed the assessment and treatment plan with the patient. The patient was provided an opportunity to ask questions and all were answered. The patient agreed with the plan and demonstrated an understanding of the instructions.  A copy of instructions were sent to the patient via MyChart unless otherwise noted below.  The patient was advised to call back or seek an in-person evaluation if the symptoms worsen or if the condition fails to improve as anticipated.    Delon CHRISTELLA Dickinson, PA-C

## 2024-11-05 NOTE — Patient Instructions (Signed)
 Janice Williams, thank you for joining Delon CHRISTELLA Dickinson, PA-C for today's virtual visit.  While this provider is not your primary care provider (PCP), if your PCP is located in our provider database this encounter information will be shared with them immediately following your visit.   A [facility] MyChart account gives you access to today's visit and all your visits, tests, and labs performed at [facility]  click here if you don't have a [facility] MyChart account or go to [email or web address]  Consent: (Patient) Janice Williams provided verbal consent for this virtual visit at the beginning of the encounter.  Current Medications:  Current Outpatient Medications:    nystatin  (MYCOSTATIN ) 100000 UNIT/ML suspension, Take 5 mLs (500,000 Units total) by mouth 4 (four) times daily., Disp: 240 mL, Rfl: 0   albuterol  (VENTOLIN  HFA) 108 (90 Base) MCG/ACT inhaler, Inhale 2 puffs into the lungs every 6 (six) hours as needed for wheezing or shortness of breath., Disp: 18 g, Rfl: 1   amLODipine  (NORVASC ) 10 MG tablet, Take 1 tablet (10 mg total) by mouth daily., Disp: 90 tablet, Rfl: 3   bictegravir-emtricitabine -tenofovir  AF (BIKTARVY ) 50-200-25 MG TABS tablet, Take 1 tablet by mouth daily., Disp: 30 tablet, Rfl: 2   cefdinir  (OMNICEF ) 300 MG capsule, Take 1 capsule (300 mg total) by mouth 2 (two) times daily. 1 po BID (Patient not taking: Reported on 10/22/2024), Disp: 20 capsule, Rfl: 0   Ensure (ENSURE), Take 237 mLs by mouth 3 (three) times daily between meals., Disp: 237 mL, Rfl: 5   EPIPEN  2-PAK 0.3 MG/0.3ML SOAJ injection, , Disp: , Rfl:    fluticasone  (FLONASE ) 50 MCG/ACT nasal spray, Place 2 sprays into both nostrils daily., Disp: 16 g, Rfl: 6   levocetirizine (XYZAL ) 5 MG tablet, Take 1 tablet (5 mg total) by mouth every evening., Disp: 90 tablet, Rfl: 3   megestrol  (MEGACE ) 400 MG/10ML suspension, Take 10 mLs (400 mg total) by mouth daily., Disp: 240 mL, Rfl: 1   MIEBO  1.338 GM/ML SOLN, , Disp: , Rfl:    pantoprazole  (PROTONIX ) 40 MG tablet, Take 1 tablet (40 mg total) by mouth daily., Disp: 30 tablet, Rfl: 2   promethazine  (PHENERGAN ) 12.5 MG tablet, Take 1 tablet (12.5 mg total) by mouth every 6 (six) hours as needed for nausea or vomiting., Disp: 30 tablet, Rfl: 0   RESTASIS 0.05 % ophthalmic emulsion, 1 drop 2 (two) times daily., Disp: , Rfl:    rosuvastatin  (CRESTOR ) 40 MG tablet, Take 1 tablet (40 mg total) by mouth daily., Disp: 90 tablet, Rfl: 3   Tiotropium Bromide -Olodaterol 2.5-2.5 MCG/ACT AERS, Inhale 2 puffs into the lungs daily., Disp: 4 g, Rfl: 5   traMADol  (ULTRAM ) 50 MG tablet, Take 1 tablet (50 mg total) by mouth every 6 (six) hours as needed., Disp: 60 tablet, Rfl: 3   Medications ordered in this encounter:  Meds ordered this encounter  Medications   nystatin  (MYCOSTATIN ) 100000 UNIT/ML suspension    Sig: Take 5 mLs (500,000 Units total) by mouth 4 (four) times daily.    Dispense:  240 mL    Refill:  0    Supervising Provider:   BLAISE ALEENE KIDD [[id number]]     *If you need refills on other medications prior to your next appointment, please contact your pharmacy*  Follow-Up: Call back or seek an in-person evaluation if the symptoms worsen or if the condition fails to improve as anticipated.  [facility] [phone]  Other Instructions  Oral Thrush, Adult Oral thrush, also called oral candidiasis, is a fungal infection that develops in the mouth and throat and on the tongue. It causes white patches to form in the mouth and on the tongue. Many cases of thrush are mild, but this infection can also be serious. Cel[place] can be a repeated (recurrent) problem for certain people who have a weak body defense system (immune system). The weakness can be caused by chronic illnesses, or by taking medicines that limit the body's ability to fight infection. If a person has difficulty fighting infection, the fungus that causes thrush can  spread through the body. This can cause life-threatening blood or organ infections. What are the causes? This condition is caused by a fungus (yeast) called Candida albicans. This fungus is normally present in small amounts in the mouth and on other mucous membranes. It usually causes no harm. If conditions are present that allow the fungus to grow without control, it invades surrounding tissues and becomes an infection. Other Candida species can also lead to thrush, though this is rare. What increases the risk? The following factors may make you more likely to develop this condition: Having a weakened immune system. Being an older adult. Having diabetes, cancer, or HIV (human immunodeficiency virus). Having dry mouth (xerostomia). Being pregnant or breastfeeding. Having poor dental care, especially in those who have dentures. Using antibiotic or steroid medicines. What are the signs or symptoms? Symptoms of this condition can vary from mild and moderate to severe and persistent. Symptoms may include: A burning feeling in the mouth and throat. This can occur at the start of a thrush infection. White patches that stick to the mouth and tongue. The tissue around the patches may be red, raw, and painful. If rubbed (during tooth brushing, for example), the patches and the tissue of the mouth may bleed easily. A bad taste in the mouth or difficulty tasting foods. A cottony feeling in the mouth. Pain during eating and swallowing. Poor appetite. Cracking at the corners of the mouth. How is this diagnosed? This condition is diagnosed based on: A physical exam. Your medical history. How is this treated? This condition is treated with medicines called antifungals, which prevent the growth of fungi. These medicines are either applied directly to the affected area (topical) or swallowed (oral). The treatment will depend on the severity of the condition. Mild cases of thrush may be treated with an  antifungal mouth rinse or lozenges. Treatment usually lasts about 14 days. Moderate to severe cases of thrush can be treated with oral antifungal medicine, if they have spread to the esophagus. A topical antifungal medicine may also be used. For some severe infections, treatment may need to continue for more than 14 days. Oral antifungal medicines are rarely used during pregnancy because they may be harmful to the unborn child. If you are pregnant, talk with your health care provider about options for treatment. Persistent or recurrent thrush. For cases of thrush that do not go away or keep coming back: Treatment may be needed twice as long as the symptoms last. Treatment will include both oral and topical antifungal medicines. People with a weakened immune system can take an antifungal medicine on a continuous basis to prevent thrush infections. It is important to treat conditions that make a person more likely to get thrush, such as diabetes or HIV. Follow these instructions at home: Relieving soreness and discomfort To help reduce the discomfort of thrush: Drink cold liquids such as water or iced  tea. Try flavored ice treats or frozen juices. Eat foods that are easy to swallow, such as gelatin, ice cream, or custard. Try drinking from a straw if the patches in your mouth are painful.  General instructions Take or use over-the-counter and prescription medicines only as told by your health care provider. Eat plain, unflavored yogurt as directed by your health care provider. Check the label to make sure the yogurt contains live cultures. This yogurt can help healthy bacteria grow in the mouth and can stop the growth of the fungus that causes thrush. If you wear dentures, remove the dentures before going to bed, brush them vigorously, and soak them in a cleaning solution as directed by your health care provider. Rinse your mouth with a warm salt-water mixture several times a day. To make a  salt-water mixture, dissolve -1 tsp (3-6 g) of salt in 1 cup (237 mL) of warm water. Contact a health care provider if: Your symptoms are getting worse or are not improving within 7 days of starting treatment. You have symptoms of a spreading infection, such as white patches on the skin outside of the mouth. You are breastfeeding your baby and you have redness and pain in the nipples. Summary Oral thrush, also called oral candidiasis, is a fungal infection that develops in the mouth and throat and on the tongue. It causes white patches to form in the mouth and on the tongue. You are more likely to get this condition if you have a weakened immune system or an underlying condition, such as HIV, cancer, or diabetes. This condition is treated with medicines called antifungals, which prevent the growth of fungi. Contact a health care provider if your symptoms do not improve, or get worse, within 7 days of starting treatment. This information is not intended to replace advice given to you by your health care provider. Make sure you discuss any questions you have with your health care provider. Document Revised: 11/20/2022 Document Reviewed: 11/20/2022 Elsevier Patient Education  2024 [employer]   If you have been instructed to have an in-person evaluation today at a local Urgent Care facility, please use the link below. It will take you to a list of all of our available [facility], including address, phone number and hours of operation. Please do not delay care.  [facility]  If you or a family member do not have a primary care provider, use the link below to schedule a visit and establish care. When you choose a [facility] primary care physician or advanced practice provider, you gain a long-term partner in health. Find a Primary Care Provider  Learn more about [facility] in-office and virtual care options: [facility] - Get Care Now

## 2024-11-05 NOTE — Addendum Note (Signed)
 Addended by: VIVIENNE DELON HERO on: 11/05/2024 07:49 PM   Modules accepted: Orders

## 2024-11-06 ENCOUNTER — Telehealth

## 2024-11-16 ENCOUNTER — Telehealth: Admitting: Physician Assistant

## 2024-11-16 DIAGNOSIS — B370 Candidal stomatitis: Secondary | ICD-10-CM

## 2024-11-16 MED ORDER — FLUCONAZOLE 100 MG PO TABS: ORAL_TABLET | ORAL | 0 refills | Status: DC

## 2024-11-16 NOTE — Progress Notes (Signed)
 Virtual Visit Consent   Janice Williams, you are scheduled for a virtual visit with a [facility] provider today. Just as with appointments in the office, your consent must be obtained to participate. Your consent will be active for this visit and any virtual visit you may have with one of our providers in the next 365 days. If you have a MyChart account, a copy of this consent can be sent to you electronically.  As this is a virtual visit, video technology does not allow for your provider to perform a traditional examination. This may limit your provider's ability to fully assess your condition. If your provider identifies any concerns that need to be evaluated in person or the need to arrange testing (such as labs, EKG, etc.), we will make arrangements to do so. Although advances in technology are sophisticated, we cannot ensure that it will always work on either your end or our end. If the connection with a video visit is poor, the visit may have to be switched to a telephone visit. With either a video or telephone visit, we are not always able to ensure that we have a secure connection.  By engaging in this virtual visit, you consent to the provision of healthcare and authorize for your insurance to be billed (if applicable) for the services provided during this visit. Depending on your insurance coverage, you may receive a charge related to this service.  I need to obtain your verbal consent now. Are you willing to proceed with your visit today? Janice Williams has provided verbal consent on 11/16/2024 for a virtual visit (video or telephone). Armina Galloway, PA-C  Date: 11/16/2024 11:43 AM   Virtual Visit via Video Note   I, Janice Williams, connected with  Janice Williams  ([id number], 09-19-1961) on 11/16/24 at 11:30 AM EST by a video-enabled telemedicine application and verified that I am speaking with the correct person using two identifiers.  Location: Patient: Virtual Visit Location Patient:  Home Provider: Virtual Visit Location Provider: Home Office   I discussed the limitations of evaluation and management by telemedicine and the availability of in person appointments. The patient expressed understanding and agreed to proceed.    History of Present Illness: Janice Williams is a 63 y.o. who identifies as a female who was assigned female at birth, and is being seen today for oral thrush.  HPI: Patient presents for evaluation of persistent thrush symptoms going on for the last 2 weeks.  She was seen in November 19 and treated with nystatin  rinse.  Patient reports no relief.  She continues to have white patches on her tongue with discomfort with the cough and mouth like sensation.  No change in medications.  Last viral load was undetectable last per patient.  She reports her next follow-up is December.  She denies fevers, chills, nausea, vomiting.    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   Pulmonary emphysema (HCC) 07/30/2024   Intractable nausea and vomiting 07/29/2024   Allergic rhinitis due to animal hair and dander 07/11/2024   Mild persistent asthma, uncomplicated 07/11/2024   Other chronic allergic conjunctivitis 07/11/2024   Other allergic rhinitis 07/11/2024   Vitamin D  deficiency 08/08/2023   Rash of hand 12/21/2022   Former cigarette smoker 11/16/2022   Sprain of calcaneofibular  ligament 02/28/2022   Degenerative disc disease, cervical 10/13/2020   Osteoporosis 10/10/2019   Chronic obstructive pulmonary disease (HCC) 07/08/2019   Moderate protein-calorie malnutrition 05/31/2018   Hypertension 06/08/2017   Hyperlipidemia 06/03/2009   Human immunodeficiency virus (HIV) disease (HCC) 10/29/2008   GERD 10/29/2008   CEREBROVASCULAR ACCIDENT, HX OF 10/29/2008    Symptoms concerning nutrition, metabolism, and development 02/14/2007   Rhinitis, chronic 02/14/2007    Allergies:  Allergies  Allergen Reactions   Varenicline  Itching, Other (See Comments) and Dermatitis   Medications:  Current Outpatient Medications:    fluconazole  (DIFLUCAN ) 100 MG tablet, Take 2 tablets (200 mg total) by mouth daily for 1 day, THEN 1 tablet (100 mg total) daily for 6 days., Disp: 8 tablet, Rfl: 0   albuterol  (VENTOLIN  HFA) 108 (90 Base) MCG/ACT inhaler, Inhale 2 puffs into the lungs every 6 (six) hours as needed for wheezing or shortness of breath., Disp: 18 g, Rfl: 1   amLODipine  (NORVASC ) 10 MG tablet, Take 1 tablet (10 mg total) by mouth daily., Disp: 90 tablet, Rfl: 3   bictegravir-emtricitabine -tenofovir  AF (BIKTARVY ) 50-200-25 MG TABS tablet, Take 1 tablet by mouth daily., Disp: 30 tablet, Rfl: 2   cefdinir  (OMNICEF ) 300 MG capsule, Take 1 capsule (300 mg total) by mouth 2 (two) times daily. 1 po BID (Patient not taking: Reported on 10/22/2024), Disp: 20 capsule, Rfl: 0   Ensure (ENSURE), Take 237 mLs by mouth 3 (three) times daily between meals., Disp: 237 mL, Rfl: 5   EPIPEN  2-PAK 0.3 MG/0.3ML SOAJ injection, , Disp: , Rfl:    fluticasone  (FLONASE ) 50 MCG/ACT nasal spray, Place 2 sprays into both nostrils daily., Disp: 16 g, Rfl: 6   levocetirizine (XYZAL ) 5 MG tablet, Take 1 tablet (5 mg total) by mouth every evening., Disp: 90 tablet, Rfl: 3   megestrol  (MEGACE ) 400 MG/10ML suspension, Take 10 mLs (400 mg total) by mouth daily., Disp: 240 mL, Rfl: 1   MIEBO 1.338 GM/ML SOLN, , Disp: , Rfl:    nystatin  (MYCOSTATIN ) 100000 UNIT/ML suspension, Take 5 mLs (500,000 Units total) by mouth 4 (four) times daily., Disp: 240 mL, Rfl: 0   pantoprazole  (PROTONIX ) 40 MG tablet, Take 1 tablet (40 mg total) by mouth daily., Disp: 30 tablet, Rfl: 2   promethazine  (PHENERGAN ) 12.5 MG tablet, Take 1 tablet (12.5 mg total) by mouth every 6 (six) hours as needed for nausea or  vomiting., Disp: 30 tablet, Rfl: 0   RESTASIS 0.05 % ophthalmic emulsion, 1 drop 2 (two) times daily., Disp: , Rfl:    rosuvastatin  (CRESTOR ) 40 MG tablet, Take 1 tablet (40 mg total) by mouth daily., Disp: 90 tablet, Rfl: 3   Tiotropium Bromide -Olodaterol 2.5-2.5 MCG/ACT AERS, Inhale 2 puffs into the lungs daily., Disp: 4 g, Rfl: 5   traMADol  (ULTRAM ) 50 MG tablet, Take 1 tablet (50 mg total) by mouth every 6 (six) hours as needed., Disp: 60 tablet, Rfl: 3  Observations/Objective: Patient is well-developed, well-nourished in no acute distress.  Resting comfortably at home.  Head is normocephalic, atraumatic.  Tongue with white thick clear, patches no swelling of lips, No labored breathing.  Speech is clear and coherent with logical content.  Patient is alert and oriented at baseline.    Assessment and Plan: 1. Oral thrush (Primary) - fluconazole  (DIFLUCAN ) 100 MG tablet; Take 2 tablets (200 mg total) by mouth daily for 1 day, THEN 1 tablet (100 mg total) daily for 6 days.  Dispense: 8 tablet; Refill: 0  Given  immunocompromise state and failed topical treatment will treat with oral fluconazole .  Patient reports she has taken oral fluconazole  in the past with relief of her symptoms.  Patient has an appointment scheduled for follow-up with ID first week of December.  Patient given strict in person follow-up guidelines if persistent symptoms.  Patient verbalized understanding and is in agreement with treatment plan.   Follow Up Instructions: I discussed the assessment and treatment plan with the patient. The patient was provided an opportunity to ask questions and all were answered. The patient agreed with the plan and demonstrated an understanding of the instructions.  A copy of instructions were sent to the patient via MyChart unless otherwise noted below.     The patient was advised to call back or seek an in-person evaluation if the symptoms worsen or if the condition fails to  improve as anticipated.    Janice Tirone, PA-C

## 2024-11-16 NOTE — Patient Instructions (Signed)
 [name], thank you for joining Yon Schiffman, PA-C for today's virtual visit.  While this provider is not your primary care provider (PCP), if your PCP is located in our provider database this encounter information will be shared with them immediately following your visit.   A [facility] MyChart account gives you access to today's visit and all your visits, tests, and labs performed at [facility]  click here if you don't have a [facility] MyChart account or go to [email or web address]  Consent: (Patient) Sebrina D Brunn provided verbal consent for this virtual visit at the beginning of the encounter.  Current Medications:  Current Outpatient Medications:    fluconazole  (DIFLUCAN ) 100 MG tablet, Take 2 tablets (200 mg total) by mouth daily for 1 day, THEN 1 tablet (100 mg total) daily for 6 days., Disp: 8 tablet, Rfl: 0   albuterol  (VENTOLIN  HFA) 108 (90 Base) MCG/ACT inhaler, Inhale 2 puffs into the lungs every 6 (six) hours as needed for wheezing or shortness of breath., Disp: 18 g, Rfl: 1   amLODipine  (NORVASC ) 10 MG tablet, Take 1 tablet (10 mg total) by mouth daily., Disp: 90 tablet, Rfl: 3   bictegravir-emtricitabine -tenofovir  AF (BIKTARVY ) 50-200-25 MG TABS tablet, Take 1 tablet by mouth daily., Disp: 30 tablet, Rfl: 2   cefdinir  (OMNICEF ) 300 MG capsule, Take 1 capsule (300 mg total) by mouth 2 (two) times daily. 1 po BID (Patient not taking: Reported on 10/22/2024), Disp: 20 capsule, Rfl: 0   Ensure (ENSURE), Take 237 mLs by mouth 3 (three) times daily between meals., Disp: 237 mL, Rfl: 5   EPIPEN  2-PAK 0.3 MG/0.3ML SOAJ injection, , Disp: , Rfl:    fluticasone  (FLONASE ) 50 MCG/ACT nasal spray, Place 2 sprays into both nostrils daily., Disp: 16 g, Rfl: 6   levocetirizine (XYZAL ) 5 MG tablet, Take 1 tablet (5 mg total) by mouth every evening., Disp: 90 tablet, Rfl: 3   megestrol  (MEGACE ) 400 MG/10ML suspension, Take 10 mLs (400 mg total) by mouth daily., Disp: 240  mL, Rfl: 1   MIEBO 1.338 GM/ML SOLN, , Disp: , Rfl:    nystatin  (MYCOSTATIN ) 100000 UNIT/ML suspension, Take 5 mLs (500,000 Units total) by mouth 4 (four) times daily., Disp: 240 mL, Rfl: 0   pantoprazole  (PROTONIX ) 40 MG tablet, Take 1 tablet (40 mg total) by mouth daily., Disp: 30 tablet, Rfl: 2   promethazine  (PHENERGAN ) 12.5 MG tablet, Take 1 tablet (12.5 mg total) by mouth every 6 (six) hours as needed for nausea or vomiting., Disp: 30 tablet, Rfl: 0   RESTASIS 0.05 % ophthalmic emulsion, 1 drop 2 (two) times daily., Disp: , Rfl:    rosuvastatin  (CRESTOR ) 40 MG tablet, Take 1 tablet (40 mg total) by mouth daily., Disp: 90 tablet, Rfl: 3   Tiotropium Bromide -Olodaterol 2.5-2.5 MCG/ACT AERS, Inhale 2 puffs into the lungs daily., Disp: 4 g, Rfl: 5   traMADol  (ULTRAM ) 50 MG tablet, Take 1 tablet (50 mg total) by mouth every 6 (six) hours as needed., Disp: 60 tablet, Rfl: 3   Medications ordered in this encounter:  Meds ordered this encounter  Medications   fluconazole  (DIFLUCAN ) 100 MG tablet    Sig: Take 2 tablets (200 mg total) by mouth daily for 1 day, THEN 1 tablet (100 mg total) daily for 6 days.    Dispense:  8 tablet    Refill:  0    Supervising Provider:   BLAISE ALEENE KIDD [id number]     *If you need refills on  other medications prior to your next appointment, please contact your pharmacy*  Follow-Up: Call back or seek an in-person evaluation if the symptoms worsen or if the condition fails to improve as anticipated.  [facility] [phone]  Other Instructions Given you have failed topical treatment for thrush, will treat with fluconazole  at this time. Please follow-up with ID as scheduled Follow-up sooner if no change in symptoms or worsening symptoms. Patient understands there are some limitations with video evaluation which is why it is important that she follow-up in person if ongoing symptoms    If you have been instructed to have an in-person  evaluation today at a local Urgent Care facility, please use the link below. It will take you to a list of all of our available [facility], including address, phone number and hours of operation. Please do not delay care.  [facility]  If you or a family member do not have a primary care provider, use the link below to schedule a visit and establish care. When you choose a [facility] primary care physician or advanced practice provider, you gain a long-term partner in health. Find a Primary Care Provider  Learn more about [facility]'s in-office and virtual care options: [facility] - Get Care Now

## 2024-11-20 ENCOUNTER — Telehealth: Payer: Self-pay

## 2024-11-20 NOTE — Telephone Encounter (Signed)
 A document form has been faxed: [facility] referral form, to be filled out by provider. Send document back via Fax within 7-days to 14 days. Document is located in providers tray at front office.          Fax number: [phone]

## 2024-11-21 ENCOUNTER — Other Ambulatory Visit: Payer: Self-pay

## 2024-11-21 ENCOUNTER — Ambulatory Visit: Payer: Self-pay

## 2024-11-21 ENCOUNTER — Telehealth: Admitting: Internal Medicine

## 2024-11-21 DIAGNOSIS — B20 Human immunodeficiency virus [HIV] disease: Secondary | ICD-10-CM

## 2024-11-21 MED ORDER — FLUCONAZOLE 200 MG PO TABS: 200.0000 mg | ORAL_TABLET | Freq: Every day | ORAL | 0 refills | Status: DC

## 2024-11-21 NOTE — Progress Notes (Signed)
 Virtual Visit via Video Note  I connected with Janice Williams on 11/21/24 at  9:30 AM EST by a video enabled telemedicine application and verified that I am speaking with the correct person using two identifiers.  Location: Patient: at home Provider: at clinic   I discussed the limitations of evaluation and management by telemedicine and the availability of in person appointments. The patient expressed understanding and agreed to proceed.  History of Present Illness:   She had dental procedure on 11/17, and now has ongoing thrush Taste and bitterness is Has been on nystatin  that didn't work -- then only fluconazole  for 6 days, but still having symptoms  Observations/Objective: Gen = a xo by 3 in NAD HEENT = + thrush on visual exam  Assessment and Plan: Doing well with biktarvy  -- continue to take daily; will do labs next month; future orders in place Fluconazole  200mg  daily; stop nystatin  Improving weight with megace   Follow Up Instructions:    I discussed the assessment and treatment plan with the patient. The patient was provided an opportunity to ask questions and all were answered. The patient agreed with the plan and demonstrated an understanding of the instructions.   The patient was advised to call back or seek an in-person evaluation if the symptoms worsen or if the condition fails to improve as anticipated.  I personally spent a total of 20 minutes in the care of the patient today including preparing to see the patient, getting/reviewing separately obtained history, performing a medically appropriate exam/evaluation, placing orders, documenting clinical information in the EHR, and communicating results.   Montie Bologna, MD

## 2024-11-21 NOTE — Telephone Encounter (Signed)
Provider has paperwork

## 2024-11-25 ENCOUNTER — Telehealth: Payer: Self-pay

## 2024-11-25 NOTE — Telephone Encounter (Signed)
 A document form has been faxed: provider referral requested for pcs, to be filled out by provider. Send document back via Fax within 7-days to 14 days. Document is located in providers tray at front office.          Fax number: [phone]

## 2024-11-26 ENCOUNTER — Other Ambulatory Visit: Payer: Self-pay

## 2024-11-26 NOTE — Telephone Encounter (Signed)
 Provider has papers at her desk. Awaiting signature

## 2024-11-26 NOTE — Telephone Encounter (Signed)
 Complete

## 2024-12-01 ENCOUNTER — Other Ambulatory Visit: Payer: Self-pay

## 2024-12-01 ENCOUNTER — Other Ambulatory Visit: Payer: Self-pay | Admitting: Internal Medicine

## 2024-12-01 DIAGNOSIS — B20 Human immunodeficiency virus [HIV] disease: Secondary | ICD-10-CM

## 2024-12-01 MED ORDER — BIKTARVY 50-200-25 MG PO TABS
1.0000 | ORAL_TABLET | Freq: Every day | ORAL | 0 refills | Status: DC
  Filled 2024-12-01: qty 30, 30d supply, fill #0

## 2024-12-03 ENCOUNTER — Other Ambulatory Visit: Payer: Self-pay

## 2024-12-03 ENCOUNTER — Other Ambulatory Visit: Payer: Self-pay | Admitting: Pharmacy Technician

## 2024-12-03 NOTE — Telephone Encounter (Signed)
>>   Dec 03, 2024  8:36 AM Antwanette L wrote: Dr. Montie Bologna last refilled the patients traMADol  (Ultram ) 50 mg tablet on 08/13/2024. The patient states that Dr. Bologna informed her to contact her primary care provider for any future refills

## 2024-12-03 NOTE — Telephone Encounter (Signed)
 Copied from CRM #[id number]. Topic: Clinical - Medication Refill >> Dec 03, 2024  8:30 AM Antwanette L wrote: Medication: traMADol  (ULTRAM ) 50 MG tablet  Has the patient contacted their pharmacy? No  This is the patient's preferred pharmacy:  [facility] [address] [place] - [address]  [address] Phone: [phone] Fax: [phone]  Is this the correct pharmacy for this prescription? Yes   Has the prescription been filled recently? Yes. Last refill was on 08/13/24  Is the patient out of the medication? No. Pt has a few days left worth of medicine  Has the patient been seen for an appointment in the last year OR does the patient have an upcoming appointment? Yes. Last ov with Sula Lout PA-C was on 10/22/24 and next appt is 01/22/25  Can we respond through MyChart? No. Contact the patient by phone at [phone]  Agent: Please be advised that Rx refills may take up to 3 business days. We ask that you follow-up with your pharmacy. >> Dec 03, 2024  8:36 AM Antwanette L wrote: Dr. Montie Bologna last refilled the patients traMADol  (Ultram ) 50 mg tablet on 08/13/2024. The patient states that Dr. Bologna informed her to contact her primary care provider for any future refills

## 2024-12-03 NOTE — Telephone Encounter (Unsigned)
 Copied from CRM #[id number]. Topic: Clinical - Medication Refill >> Dec 03, 2024  8:30 AM Antwanette L wrote: Medication: traMADol  (ULTRAM ) 50 MG tablet  Has the patient contacted their pharmacy? No  This is the patient's preferred pharmacy:  [facility] [address] [place]  - [address] [place] [address] Phone: [phone] Fax: [phone]  Is this the correct pharmacy for this prescription? Yes   Has the prescription been filled recently? Yes. Last refill was on 08/13/24  Is the patient out of the medication? No. Pt has a few days left worth of medicine  Has the patient been seen for an appointment in the last year OR does the patient have an upcoming appointment? Yes. Last ov with Sula Lout PA-C was on 10/22/24 and next appt is 01/22/25  Can we respond through MyChart? No. Contact the patient by phone at [phone]  Agent: Please be advised that Rx refills may take up to 3 business days. We ask that you follow-up with your pharmacy.

## 2024-12-03 NOTE — Progress Notes (Signed)
 Specialty Pharmacy Refill Coordination Note  Janice Williams is a 63 y.o. female contacted today regarding refills of specialty medication(s) Bictegravir-Emtricitab-Tenofov (Biktarvy )   Patient requested Delivery   Delivery date: 12/08/24   Verified address: [address]  [name]   Medication will be filled on: 12/05/24

## 2024-12-04 ENCOUNTER — Ambulatory Visit
Admission: RE | Admit: 2024-12-04 | Discharge: 2024-12-04 | Disposition: A | Discharge status: Home / Self Care | Source: Ambulatory Visit

## 2024-12-04 VITALS — BP 151/96 | HR 114 | Temp 98.4°F | Resp 20 | Wt 91.5 lb

## 2024-12-04 DIAGNOSIS — J019 Acute sinusitis, unspecified: Secondary | ICD-10-CM

## 2024-12-04 DIAGNOSIS — B370 Candidal stomatitis: Secondary | ICD-10-CM

## 2024-12-04 MED ORDER — AZITHROMYCIN 250 MG PO TABS: 250.0000 mg | ORAL_TABLET | Freq: Every day | ORAL | 0 refills | Status: DC

## 2024-12-04 MED ORDER — TRAMADOL HCL 50 MG PO TABS: 50.0000 mg | ORAL_TABLET | Freq: Four times a day (QID) | ORAL | 3 refills | Status: AC | PRN

## 2024-12-04 MED ORDER — CLOTRIMAZOLE 10 MG MT TROC: 10.0000 mg | Freq: Every day | OROMUCOSAL | 0 refills | Status: AC

## 2024-12-04 NOTE — Discharge Instructions (Signed)

## 2024-12-04 NOTE — ED Triage Notes (Signed)
 Have been dealing with dry mouth and  sinuses  for about a week now and seems like it not getting better.  Nasal passages are bleeding from the dryness of the gas heat that I have in my apartment. - Entered by patient  I feel like I have an infection in my mouth. I been dealing with this for abut a month nose. They gave me Fluconazole  but it ain't doing nothing. I dont tried Nystatin  too and that aint work. It's just an unbearable taste and I can't do it no more. They told me if it don't get better to come get it check cuz it might be something else. I bought some Biotene for dry mouth. It's not helping. Nothing is helping.

## 2024-12-04 NOTE — ED Provider Notes (Signed)
 [facility]    CSN: [id number] Arrival date & time: 12/04/24  0948      History   Chief Complaint Chief Complaint  Patient presents with   Nasal Congestion    Have been dealing with dry mouth and  sinuses  for about a week now and seems like it not getting better.  Nasal passages are bleeding from the dryness of the gas heat that I have in my apartment. - Entered by patient    HPI Janice Williams is a 63 y.o. female.   Pt presents today due to 3 weeks of sinus pressure and purulent post nasal drip with blood streaking. Pt states that she has been using flonase  and sinus rinse with no relief. Pt also states that she is experiencing nausea and foul taste in mouth for the past month. Pt states that she has been given fluconazole  and nystatin  for thrush without relief.   The history is provided by the patient.    Past Medical History:  Diagnosis Date   Anemia    Arthritis    generalized   Blood transfusion without reported diagnosis 2003   Bronchitis    Cataract    COPD (chronic obstructive pulmonary disease) (HCC)    COPD with acute exacerbation (HCC) 06/09/2017   Dental abscess 03/15/2015   DYSPEPSIA 02/14/2007   Qualifier: Diagnosis of  By: Manford Longs     EXTERNAL OTITIS 01/06/2010   Qualifier: Diagnosis of  By: Janna MD, Burnard     FACIAL RASH 02/04/2009   Qualifier: Diagnosis of  By: Cleotilde Pencil     Former smoker    quit 2014   GERD (gastroesophageal reflux disease)    Glaucoma    HIV infection (HCC)    Hypertension    Osteoporosis    Seasonal allergies    Stroke (HCC) 2003   Substance abuse (HCC)    history, clean 7 years   SVD (spontaneous vaginal delivery)    x 3    Patient Active Problem List   Diagnosis Date Noted   Appetite impaired 10/23/2024   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   Intractable headache 07/30/2024   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   Pulmonary emphysema (HCC)  07/30/2024   Intractable nausea and vomiting 07/29/2024   Allergic rhinitis due to animal hair and dander 07/11/2024   Mild persistent asthma, uncomplicated 07/11/2024   Other chronic allergic conjunctivitis 07/11/2024   Other allergic rhinitis 07/11/2024   Vitamin D  deficiency 08/08/2023   Rash of hand 12/21/2022   Former cigarette smoker 11/16/2022   Sprain of calcaneofibular ligament 02/28/2022   Degenerative disc disease, cervical 10/13/2020   Osteoporosis 10/10/2019   Chronic obstructive pulmonary disease (HCC) 07/08/2019   Moderate protein-calorie malnutrition 05/31/2018   Hypertension 06/08/2017   Hyperlipidemia 06/03/2009   Human immunodeficiency virus (HIV) disease (HCC) 10/29/2008   GERD 10/29/2008   CEREBROVASCULAR ACCIDENT, HX OF 10/29/2008   Symptoms concerning nutrition, metabolism, and development 02/14/2007   Rhinitis, chronic 02/14/2007    Past Surgical History:  Procedure Laterality Date   COLONOSCOPY  2019   KN-MAC-suprep(adeq)-tics/hems/TA x 5   MULTIPLE TOOTH EXTRACTIONS     with sedation   POLYPECTOMY  01/2008   TA x 5   TONSILLECTOMY  1973   TUBAL LIGATION     UPPER GI ENDOSCOPY     normal per patient - years ago    OB History     Gravida  3  Para  3   Term  3   Preterm      AB      Living  3      SAB      IAB      Ectopic      Multiple      Live Births  3            Home Medications    Prior to Admission medications  Medication Sig Start Date End Date Taking? Authorizing Provider  azithromycin  (ZITHROMAX ) 250 MG tablet Take 1 tablet (250 mg total) by mouth daily. Take first 2 tablets together, then 1 every day until finished. 12/04/24  Yes Andra Corean BROCKS, PA-C  clotrimazole  (MYCELEX ) 10 MG troche Take 1 tablet (10 mg total) by mouth 5 (five) times daily for 7 days. 12/04/24 12/11/24 Yes Andra Corean C, PA-C  albuterol  (VENTOLIN  HFA) 108 (90 Base) MCG/ACT inhaler Inhale 2 puffs into the lungs every 6  (six) hours as needed for wheezing or shortness of breath. 09/15/24   Leavy Rode, Sula, PA-C  amLODipine  (NORVASC ) 10 MG tablet Take 1 tablet (10 mg total) by mouth daily. 03/19/24 03/19/25  Marylu Gee, DO  bictegravir-emtricitabine -tenofovir  AF (BIKTARVY ) 50-200-25 MG TABS tablet Take 1 tablet by mouth daily. Additional refills pending 1/12 labs 12/01/24   Luiz Channel, MD  cefdinir  (OMNICEF ) 300 MG capsule Take 1 capsule (300 mg total) by mouth 2 (two) times daily. 1 po BID Patient not taking: Reported on 10/22/2024 09/21/24   Lavell Bari LABOR, FNP  Ensure (ENSURE) Take 237 mLs by mouth 3 (three) times daily between meals. 10/22/24   Luiz Channel, MD  EPIPEN  2-PAK 0.3 MG/0.3ML SOAJ injection  03/21/22   [provider]  fluconazole  (DIFLUCAN ) 200 MG tablet Take 1 tablet (200 mg total) by mouth daily. 11/21/24   Luiz Channel, MD  fluticasone  (FLONASE ) 50 MCG/ACT nasal spray Place 2 sprays into both nostrils daily. 09/03/24   Tapia Cedeno, Jorge, PA-C  levocetirizine (XYZAL ) 5 MG tablet Take 1 tablet (5 mg total) by mouth every evening. 12/26/23   Luiz Channel, MD  megestrol  (MEGACE ) 400 MG/10ML suspension Take 10 mLs (400 mg total) by mouth daily. 10/31/24   Luiz Channel, MD  MIEBO 1.338 GM/ML SOLN  02/25/24   [provider]  pantoprazole  (PROTONIX ) 40 MG tablet Take 1 tablet (40 mg total) by mouth daily. 10/31/24 11/30/24  Luiz Channel, MD  promethazine  (PHENERGAN ) 12.5 MG tablet Take 1 tablet (12.5 mg total) by mouth every 6 (six) hours as needed for nausea or vomiting. 08/13/24   Luiz Channel, MD  RESTASIS 0.05 % ophthalmic emulsion 1 drop 2 (two) times daily. 08/05/21   [provider]  rosuvastatin  (CRESTOR ) 40 MG tablet Take 1 tablet (40 mg total) by mouth daily. 12/27/23 12/26/24  Marylu Gee, DO  Tiotropium Bromide -Olodaterol 2.5-2.5 MCG/ACT AERS Inhale 2 puffs into the lungs daily. 09/12/24   Tapia Cedeno, Jorge, PA-C  traMADol  (ULTRAM ) 50 MG tablet Take  1 tablet (50 mg total) by mouth every 6 (six) hours as needed. 12/04/24   Leavy Rode Sula, PA-C    Family History Family History  Problem Relation Age of Onset   Hypertension Mother    Hypertension Father    Hyperlipidemia Father    COPD Father    Atrial fibrillation Father    Colon cancer Neg Hx    Rectal cancer Neg Hx    Stomach cancer Neg Hx    Colon polyps Neg Hx  Esophageal cancer Neg Hx    BRCA 1/2 Neg Hx    Breast cancer Neg Hx     Social History Social History[1]   Allergies   Varenicline    Review of Systems Review of Systems   Physical Exam Triage Vital Signs ED Triage Vitals  Encounter Vitals Group     BP 12/04/24 0957 (!) 151/96     Girls Systolic BP Percentile --      Girls Diastolic BP Percentile --      Boys Systolic BP Percentile --      Boys Diastolic BP Percentile --      Pulse Rate 12/04/24 0957 (!) 114     Resp 12/04/24 0957 20     Temp 12/04/24 0957 98.4 F (36.9 C)     Temp Source 12/04/24 0957 Oral     SpO2 12/04/24 0957 97 %     Weight 12/04/24 1007 91 lb 7.9 oz (41.5 kg)     Height --      Head Circumference --      Peak Flow --      Pain Score 12/04/24 1006 8     Pain Loc --      Pain Education --      Exclude from Growth Chart --    No data found.  Updated Vital Signs BP (!) 151/96 (BP Location: Left Arm)   Pulse (!) 114   Temp 98.4 F (36.9 C) (Oral)   Resp 20   Wt 91 lb 7.9 oz (41.5 kg)   SpO2 97%   BMI 16.21 kg/m   Visual Acuity Right Eye Distance:   Left Eye Distance:   Bilateral Distance:    Right Eye Near:   Left Eye Near:    Bilateral Near:     Physical Exam Vitals and nursing note reviewed.  Constitutional:      General: She is not in acute distress.    Appearance: Normal appearance. She is not ill-appearing, toxic-appearing or diaphoretic.  HENT:     Nose: Congestion (moderately enlarged turbinates) present. No rhinorrhea.     Right Sinus: No maxillary sinus tenderness or frontal sinus  tenderness.     Left Sinus: No maxillary sinus tenderness or frontal sinus tenderness.     Comments: No tenderness to palpation of upper lip    Mouth/Throat:     Mouth: Mucous membranes are moist.     Pharynx: Oropharynx is clear. No oropharyngeal exudate or posterior oropharyngeal erythema.  Eyes:     General: No scleral icterus. Cardiovascular:     Rate and Rhythm: Normal rate and regular rhythm.     Heart sounds: Normal heart sounds.  Pulmonary:     Effort: Pulmonary effort is normal. No respiratory distress.     Breath sounds: Normal breath sounds. No wheezing or rhonchi.  Skin:    General: Skin is warm.  Neurological:     Mental Status: She is alert and oriented to person, place, and time.  Psychiatric:        Mood and Affect: Mood normal.        Behavior: Behavior normal.      UC Treatments / Results  Labs (all labs ordered are listed, but only abnormal results are displayed) Labs Reviewed - No data to display  EKG   Radiology No results found.  Procedures Procedures (including critical care time)  Medications Ordered in UC Medications - No data to display  Initial Impression / Assessment and Plan / UC Course  I have reviewed the triage vital signs and the nursing notes.  Pertinent labs & imaging results that were available during my care of the patient were reviewed by me and considered in my medical decision making (see chart for details).    Final Clinical Impressions(s) / UC Diagnoses   Final diagnoses:  Acute sinusitis, recurrence not specified, unspecified location  Oral thrush     Discharge Instructions      You have been diagnosed with a sinus infection today, some are caused by viruses and others are caused by bacteria.  If your symptoms have been going on for less than 7 days it is most likely that you have a viral sinus infection.  Antibiotics will not work for this and it will have to run its course.  Sinus rinses (using a Nettie pot) or  saline rinses are helpful as well as pseudoephedrine , and nasal sprays along with ibuprofen  and Tylenol  for pain.  If you have had your symptoms for more than 7 days you most likely have a bacterial infection and will be prescribed antibiotics.  Supportive measures given for viral sinus infections will also be helpful for bacterial infections.  If you are using antibiotics you should start to feel better in 2 to 3 days but it is important that you complete antibiotics in their entirety.     ED Prescriptions     Medication Sig Dispense Auth. Provider   azithromycin  (ZITHROMAX ) 250 MG tablet Take 1 tablet (250 mg total) by mouth daily. Take first 2 tablets together, then 1 every day until finished. 6 tablet Andra Krabbe C, PA-C   clotrimazole  (MYCELEX ) 10 MG troche Take 1 tablet (10 mg total) by mouth 5 (five) times daily for 7 days. 35 tablet Andra Krabbe BROCKS, PA-C      PDMP not reviewed this encounter.    [1]  Social History Tobacco Use   Smoking status: Former    Current packs/day: 0.00    Average packs/day: 2.0 packs/day for 38.0 years (76.1 ttl pk-yrs)    Types: Cigarettes    Start date: 12/18/2012    Quit date: 11/21/2013    Years since quitting: 11.0    Passive exposure: Past   Smokeless tobacco: Never  Vaping Use   Vaping status: Never Used  Substance Use Topics   Alcohol use: Not Currently   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     Andra Krabbe BROCKS, PA-C 12/04/24 1045

## 2024-12-05 ENCOUNTER — Other Ambulatory Visit: Payer: Self-pay

## 2024-12-05 NOTE — Telephone Encounter (Signed)
 Requested medication (s) are due for refill today - no  Requested medication (s) are on the active medication list -yes  Future visit scheduled -yes  Last refill: 12/04/24 #60 3RF  Notes to clinic: duplicate request, non delegated Rx  Requested Prescriptions  Pending Prescriptions Disp Refills   traMADol  (ULTRAM ) 50 MG tablet 60 tablet 3    Sig: Take 1 tablet (50 mg total) by mouth every 6 (six) hours as needed.     Not Delegated - Analgesics:  Opioid Agonists Failed - 12/05/2024  2:17 PM      Failed - This refill cannot be delegated      Passed - Urine Drug Screen completed in last 360 days      Passed - Valid encounter within last 3 months    Recent Outpatient Visits           1 month ago Appetite impaired   [facility] Leavy Lucas Fox, PA-C   2 months ago Chronic obstructive pulmonary disease, unspecified COPD type [facility])   [place] [facility] Leavy Lucas Fox, PA-C   3 months ago Encounter to establish care with new provider   [facility] Leavy Lucas Fox, PA-C                 Requested Prescriptions  Pending Prescriptions Disp Refills   traMADol  (ULTRAM ) 50 MG tablet 60 tablet 3    Sig: Take 1 tablet (50 mg total) by mouth every 6 (six) hours as needed.     Not Delegated - Analgesics:  Opioid Agonists Failed - 12/05/2024  2:17 PM      Failed - This refill cannot be delegated      Passed - Urine Drug Screen completed in last 360 days      Passed - Valid encounter within last 3 months    Recent Outpatient Visits           1 month ago Appetite impaired   [facility] Leavy Lucas Fox, PA-C   2 months ago Chronic obstructive pulmonary disease, unspecified COPD type [facility])   [place] [facility] Leavy Lucas Fox, PA-C   3 months ago Encounter to establish care with new provider   [facility]  [facility] Leavy Lucas Fox, PA-C

## 2024-12-10 ENCOUNTER — Ambulatory Visit: Payer: Self-pay

## 2024-12-10 ENCOUNTER — Telehealth: Admitting: Physician Assistant

## 2024-12-10 DIAGNOSIS — J019 Acute sinusitis, unspecified: Secondary | ICD-10-CM

## 2024-12-10 DIAGNOSIS — B9689 Other specified bacterial agents as the cause of diseases classified elsewhere: Secondary | ICD-10-CM | POA: Diagnosis not present

## 2024-12-10 MED ORDER — AMOXICILLIN-POT CLAVULANATE 875-125 MG PO TABS: 1.0000 | ORAL_TABLET | Freq: Two times a day (BID) | ORAL | 0 refills | Status: AC

## 2024-12-10 NOTE — Telephone Encounter (Signed)
 FYI Only or Action Required?: FYI only for provider: UC video appt.  Patient was last seen in primary care on 11/16/2024 by Zohra, Mobeen, PA-C.  Called Nurse Triage reporting Nasal Congestion.  Symptoms began a week ago.  Interventions attempted: Rest, hydration, or home remedies.  Symptoms are: unchanged.  Triage Disposition: See Physician Within 24 Hours  Patient/caregiver understands and will follow disposition?: Yes   Copied from CRM #[id number]. Topic: Clinical - Red Word Triage >> Dec 10, 2024  8:36 AM Pinkey ORN wrote: Red Word that prompted transfer to Nurse Triage: Difficulty Breathing >> Dec 10, 2024  8:37 AM Pinkey ORN wrote: Patient states she was seen in office previously for an sinus infection. Patient states the medication that was given isn't working. Patient states she's having difficulty breathing, she's having to breathe through her mouth instead of her nose as well as experiencing some bloody mucus.  Reason for Disposition  [1] Sinus pain (not just congestion) AND [2] fever  Answer Assessment - Initial Assessment Questions No available appts with pcp. Scheduled video urgent care visit; 12/10/24.  Advised call back or ED/911 if symptoms worsen. Patient verbalized understanding.  1. LOCATION: Where does it hurt?      Dry mouth; mouth breathing, nose bleed 2. ONSET: When did the sinus pain start?  (e.g., hours, days)      12/04/24 3. SEVERITY: How bad is the pain?   (Scale 0-10; or none, mild, moderate or severe)     0/10 5. NASAL CONGESTION: Is the nose blocked? If Yes, ask: Can you open it or must you breathe through your mouth?     Yes, mouth breathing; Use humididfers 6. NASAL DISCHARGE: Do you have discharge from your nose? If so ask, What color?     Clear; streak blood 7. FEVER: Do you have a fever? If Yes, ask: What is it, how was it measured, and when did it start?      Denies fever, chills, n/v 8. OTHER SYMPTOMS: Do you have any  other symptoms? (e.g., sore throat, cough, earache, difficulty breathing) Denies diff breathing, chest pain, sore throat, cough  Protocols used: Sinus Pain or Congestion-A-AH

## 2024-12-10 NOTE — Patient Instructions (Signed)
 " Janice Williams, thank you for joining Delon CHRISTELLA Dickinson, PA-C for today's virtual visit.  While this provider is not your primary care provider (PCP), if your PCP is located in our provider database this encounter information will be shared with them immediately following your visit.   A [facility] MyChart account gives you access to today's visit and all your visits, tests, and labs performed at [facility]  click here if you don't have a [facility] MyChart account or go to [email or web address]  Consent: (Patient) Janice Williams provided verbal consent for this virtual visit at the beginning of the encounter.  Current Medications:  Current Outpatient Medications:    amoxicillin -clavulanate (AUGMENTIN ) 875-125 MG tablet, Take 1 tablet by mouth 2 (two) times daily., Disp: 20 tablet, Rfl: 0   albuterol  (VENTOLIN  HFA) 108 (90 Base) MCG/ACT inhaler, Inhale 2 puffs into the lungs every 6 (six) hours as needed for wheezing or shortness of breath., Disp: 18 g, Rfl: 1   amLODipine  (NORVASC ) 10 MG tablet, Take 1 tablet (10 mg total) by mouth daily., Disp: 90 tablet, Rfl: 3   bictegravir-emtricitabine -tenofovir  AF (BIKTARVY ) 50-200-25 MG TABS tablet, Take 1 tablet by mouth daily. Additional refills pending 1/12 labs, Disp: 30 tablet, Rfl: 0   clotrimazole  (MYCELEX ) 10 MG troche, Take 1 tablet (10 mg total) by mouth 5 (five) times daily for 7 days., Disp: 35 tablet, Rfl: 0   Ensure (ENSURE), Take 237 mLs by mouth 3 (three) times daily between meals., Disp: 237 mL, Rfl: 5   EPIPEN  2-PAK 0.3 MG/0.3ML SOAJ injection, , Disp: , Rfl:    fluconazole  (DIFLUCAN ) 200 MG tablet, Take 1 tablet (200 mg total) by mouth daily., Disp: 7 tablet, Rfl: 0   fluticasone  (FLONASE ) 50 MCG/ACT nasal spray, Place 2 sprays into both nostrils daily., Disp: 16 g, Rfl: 6   levocetirizine (XYZAL ) 5 MG tablet, Take 1 tablet (5 mg total) by mouth every evening., Disp: 90 tablet, Rfl: 3   megestrol  (MEGACE ) 400  MG/10ML suspension, Take 10 mLs (400 mg total) by mouth daily., Disp: 240 mL, Rfl: 1   MIEBO 1.338 GM/ML SOLN, , Disp: , Rfl:    pantoprazole  (PROTONIX ) 40 MG tablet, Take 1 tablet (40 mg total) by mouth daily., Disp: 30 tablet, Rfl: 2   promethazine  (PHENERGAN ) 12.5 MG tablet, Take 1 tablet (12.5 mg total) by mouth every 6 (six) hours as needed for nausea or vomiting., Disp: 30 tablet, Rfl: 0   RESTASIS 0.05 % ophthalmic emulsion, 1 drop 2 (two) times daily., Disp: , Rfl:    rosuvastatin  (CRESTOR ) 40 MG tablet, Take 1 tablet (40 mg total) by mouth daily., Disp: 90 tablet, Rfl: 3   Tiotropium Bromide -Olodaterol 2.5-2.5 MCG/ACT AERS, Inhale 2 puffs into the lungs daily., Disp: 4 g, Rfl: 5   traMADol  (ULTRAM ) 50 MG tablet, Take 1 tablet (50 mg total) by mouth every 6 (six) hours as needed., Disp: 60 tablet, Rfl: 3   Medications ordered in this encounter:  Meds ordered this encounter  Medications   amoxicillin -clavulanate (AUGMENTIN ) 875-125 MG tablet    Sig: Take 1 tablet by mouth 2 (two) times daily.    Dispense:  20 tablet    Refill:  0    Supervising Provider:   LAMPTEY, PHILIP O [[id number]]     *If you need refills on other medications prior to your next appointment, please contact your pharmacy*  Follow-Up: Call back or seek an in-person evaluation if the symptoms worsen or if the condition  fails to improve as anticipated.  [facility] [phone]  Other Instructions  Sinus Infection, Adult A sinus infection, also called sinusitis, is inflammation of your sinuses. Sinuses are hollow spaces in the bones around your face. Your sinuses are located: Around your eyes. In the middle of your forehead. Behind your nose. In your cheekbones. Mucus normally drains out of your sinuses. When your nasal tissues become inflamed or swollen, mucus can become trapped or blocked. This allows bacteria, viruses, and fungi to grow, which leads to infection. Most infections of the sinuses  are caused by a virus. A sinus infection can develop quickly. It can last for up to 4 weeks (acute) or for more than 12 weeks (chronic). A sinus infection often develops after a cold. What are the causes? This condition is caused by anything that creates swelling in the sinuses or stops mucus from draining. This includes: Allergies. Asthma. Infection from bacteria or viruses. Deformities or blockages in your nose or sinuses. Abnormal growths in the nose (nasal polyps). Pollutants, such as chemicals or irritants in the air. Infection from fungi. This is rare. What increases the risk? You are more likely to develop this condition if you: Have a weak body defense system (immune system). Do a lot of swimming or diving. Overuse nasal sprays. Smoke. What are the signs or symptoms? The main symptoms of this condition are pain and a feeling of pressure around the affected sinuses. Other symptoms include: Stuffy nose or congestion that makes it difficult to breathe through your nose. Thick yellow or greenish drainage from your nose. Tenderness, swelling, and warmth over the affected sinuses. A cough that may get worse at night. Decreased sense of smell and taste. Extra mucus that collects in the throat or the back of the nose (postnasal drip) causing a sore throat or bad breath. Tiredness (fatigue). Fever. How is this diagnosed? This condition is diagnosed based on: Your symptoms. Your medical history. A physical exam. Tests to find out if your condition is acute or chronic. This may include: Checking your nose for nasal polyps. Viewing your sinuses using a device that has a light (endoscope). Testing for allergies or bacteria. Imaging tests, such as an MRI or CT scan. In rare cases, a bone biopsy may be done to rule out more serious types of fungal sinus disease. How is this treated? Treatment for a sinus infection depends on the cause and whether your condition is chronic or  acute. If caused by a virus, your symptoms should go away on their own within 10 days. You may be given medicines to relieve symptoms. They include: Medicines that shrink swollen nasal passages (decongestants). A spray that eases inflammation of the nostrils (topical intranasal corticosteroids). Rinses that help get rid of thick mucus in your nose (nasal saline washes). Medicines that treat allergies (antihistamines). Over-the-counter pain relievers. If caused by bacteria, your health care provider may recommend waiting to see if your symptoms improve. Most bacterial infections will get better without antibiotic medicine. You may be given antibiotics if you have: A severe infection. A weak immune system. If caused by narrow nasal passages or nasal polyps, surgery may be needed. Follow these instructions at home: Medicines Take, use, or apply over-the-counter and prescription medicines only as told by your health care provider. These may include nasal sprays. If you were prescribed an antibiotic medicine, take it as told by your health care provider. Do not stop taking the antibiotic even if you start to feel better.  Hydrate and humidify  Drink enough fluid to keep your urine pale yellow. Staying hydrated will help to thin your mucus. Use a cool mist humidifier to keep the humidity level in your home above 50%. Inhale steam for 10-15 minutes, 3-4 times a day, or as told by your health care provider. You can do this in the bathroom while a hot shower is running. Limit your exposure to cool or dry air. Rest Rest as much as possible. Sleep with your head raised (elevated). Make sure you get enough sleep each night. General instructions  Apply a warm, moist washcloth to your face 3-4 times a day or as told by your health care provider. This will help with discomfort. Use nasal saline washes as often as told by your health care provider. Wash your hands often with soap and water to reduce your  exposure to germs. If soap and water are not available, use hand sanitizer. Do not smoke. Avoid being around people who are smoking (secondhand smoke). Keep all follow-up visits. This is important. Contact a health care provider if: You have a fever. Your symptoms get worse. Your symptoms do not improve within 10 days. Get help right away if: You have a severe headache. You have persistent vomiting. You have severe pain or swelling around your face or eyes. You have vision problems. You develop confusion. Your neck is stiff. You have trouble breathing. These symptoms may be an emergency. Get help right away. Call 911. Do not wait to see if the symptoms will go away. Do not drive yourself to the hospital. Summary A sinus infection is soreness and inflammation of your sinuses. Sinuses are hollow spaces in the bones around your face. This condition is caused by nasal tissues that become inflamed or swollen. The swelling traps or blocks the flow of mucus. This allows bacteria, viruses, and fungi to grow, which leads to infection. If you were prescribed an antibiotic medicine, take it as told by your health care provider. Do not stop taking the antibiotic even if you start to feel better. Keep all follow-up visits. This is important. This information is not intended to replace advice given to you by your health care provider. Make sure you discuss any questions you have with your health care provider. Document Revised: 11/08/2021 Document Reviewed: 11/08/2021 Elsevier Patient Education  2024 Elsevier Inc.   If you have been instructed to have an in-person evaluation today at a local Urgent Care facility, please use the link below. It will take you to a list of all of our available [facility], including address, phone number and hours of operation. Please do not delay care.  [facility]  If you or a family member do not have a primary care provider, use the link  below to schedule a visit and establish care. When you choose a [facility] primary care physician or advanced practice provider, you gain a long-term partner in health. Find a Primary Care Provider  Learn more about [facility] in-office and virtual care options: [facility] - Get Care Now "

## 2024-12-10 NOTE — Progress Notes (Signed)
 " Virtual Visit Consent   Janice Williams, you are scheduled for a virtual visit with a [facility] provider today. Just as with appointments in the office, your consent must be obtained to participate. Your consent will be active for this visit and any virtual visit you may have with one of our providers in the next 365 days. If you have a MyChart account, a copy of this consent can be sent to you electronically.  As this is a virtual visit, video technology does not allow for your provider to perform a traditional examination. This may limit your provider's ability to fully assess your condition. If your provider identifies any concerns that need to be evaluated in person or the need to arrange testing (such as labs, EKG, etc.), we will make arrangements to do so. Although advances in technology are sophisticated, we cannot ensure that it will always work on either your end or our end. If the connection with a video visit is poor, the visit may have to be switched to a telephone visit. With either a video or telephone visit, we are not always able to ensure that we have a secure connection.  By engaging in this virtual visit, you consent to the provision of healthcare and authorize for your insurance to be billed (if applicable) for the services provided during this visit. Depending on your insurance coverage, you may receive a charge related to this service.  I need to obtain your verbal consent now. Are you willing to proceed with your visit today? Janice Williams has provided verbal consent on 12/10/2024 for a virtual visit (video or telephone). Delon CHRISTELLA Dickinson, PA-C  Date: 12/10/2024 3:42 PM   Virtual Visit via Video Note   I, Delon CHRISTELLA Dickinson, connected with  Janice Williams  ([id number], Feb 16, 1961) on 12/10/2024 at  3:30 PM EST by a video-enabled telemedicine application and verified that I am speaking with the correct person using two identifiers.  Location: Patient: Virtual Visit  Location Patient: Home Provider: Virtual Visit Location Provider: Home Office   I discussed the limitations of evaluation and management by telemedicine and the availability of in person appointments. The patient expressed understanding and agreed to proceed.    History of Present Illness: Janice Williams is a 63 y.o. who identifies as a female who was assigned female at birth, and is being seen today for sinus congestion.  HPI: Sinusitis This is a new problem. Episode onset: Seen at [facility] on 12/04/24 and given Azithromycin ; also given Clotrimazole  lozenges for thrush-thrush is okay right now; sinuses are bad. The problem has been gradually worsening since onset. There has been no fever. Associated symptoms include congestion, coughing, headaches, sinus pressure and a sore throat (dry not sore). Pertinent negatives include no chills, diaphoresis, ear pain or hoarse voice. (Foul taste, nose bleeds, eyes are itching and sore, dry mouth from mouth breathing) Past treatments include antibiotics and saline nose sprays (Zpack, humidifiers, fluticasone , levocetirizine). The treatment provided no relief.  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   Pulmonary emphysema (HCC) 07/30/2024   Intractable nausea and vomiting 07/29/2024   Allergic rhinitis due to animal hair and dander 07/11/2024   Mild persistent asthma, uncomplicated 07/11/2024   Other chronic allergic conjunctivitis 07/11/2024   Other allergic  rhinitis 07/11/2024   Vitamin D  deficiency 08/08/2023   Rash of hand 12/21/2022   Former cigarette smoker 11/16/2022   Sprain of calcaneofibular ligament 02/28/2022   Degenerative disc disease, cervical 10/13/2020   Osteoporosis 10/10/2019   Chronic obstructive pulmonary disease (HCC)  07/08/2019   Moderate protein-calorie malnutrition 05/31/2018   Hypertension 06/08/2017   Hyperlipidemia 06/03/2009   Human immunodeficiency virus (HIV) disease (HCC) 10/29/2008   GERD 10/29/2008   CEREBROVASCULAR ACCIDENT, HX OF 10/29/2008   Symptoms concerning nutrition, metabolism, and development 02/14/2007   Rhinitis, chronic 02/14/2007    Allergies: Allergies[1] Medications: Current Medications[2]  Observations/Objective: Patient is well-developed, well-nourished in no acute distress.  Resting comfortably at home.  Head is normocephalic, atraumatic.  No labored breathing.  Speech is clear and coherent with logical content.  Patient is alert and oriented at baseline.    Assessment and Plan: 1. Acute bacterial sinusitis (Primary) - amoxicillin -clavulanate (AUGMENTIN ) 875-125 MG tablet; Take 1 tablet by mouth 2 (two) times daily.  Dispense: 20 tablet; Refill: 0  - Worsening symptoms that have not responded to OTC medications.  - Will give Augmentin  - Continue allergy medications.  - Steam and humidifier can help - Stay well hydrated and get plenty of rest.  - Seek in person evaluation if no symptom improvement or if symptoms worsen   Follow Up Instructions: I discussed the assessment and treatment plan with the patient. The patient was provided an opportunity to ask questions and all were answered. The patient agreed with the plan and demonstrated an understanding of the instructions.  A copy of instructions were sent to the patient via MyChart unless otherwise noted below.    The patient was advised to call back or seek an in-person evaluation if the symptoms worsen or if the condition fails to improve as anticipated.    Delon CHRISTELLA Dickinson, PA-C     [1]  Allergies Allergen Reactions   Varenicline  Itching, Other (See Comments) and Dermatitis  [2]  Current Outpatient Medications:    amoxicillin -clavulanate (AUGMENTIN ) 875-125 MG tablet, Take 1 tablet by mouth 2  (two) times daily., Disp: 20 tablet, Rfl: 0   albuterol  (VENTOLIN  HFA) 108 (90 Base) MCG/ACT inhaler, Inhale 2 puffs into the lungs every 6 (six) hours as needed for wheezing or shortness of breath., Disp: 18 g, Rfl: 1   amLODipine  (NORVASC ) 10 MG tablet, Take 1 tablet (10 mg total) by mouth daily., Disp: 90 tablet, Rfl: 3   bictegravir-emtricitabine -tenofovir  AF (BIKTARVY ) 50-200-25 MG TABS tablet, Take 1 tablet by mouth daily. Additional refills pending 1/12 labs, Disp: 30 tablet, Rfl: 0   clotrimazole  (MYCELEX ) 10 MG troche, Take 1 tablet (10 mg total) by mouth 5 (five) times daily for 7 days., Disp: 35 tablet, Rfl: 0   Ensure (ENSURE), Take 237 mLs by mouth 3 (three) times daily between meals., Disp: 237 mL, Rfl: 5   EPIPEN  2-PAK 0.3 MG/0.3ML SOAJ injection, , Disp: , Rfl:    fluconazole  (DIFLUCAN ) 200 MG tablet, Take 1 tablet (200 mg total) by mouth daily., Disp: 7 tablet, Rfl: 0   fluticasone  (FLONASE ) 50 MCG/ACT nasal spray, Place 2 sprays into both nostrils daily., Disp: 16 g, Rfl: 6   levocetirizine (XYZAL ) 5 MG tablet, Take 1 tablet (5 mg total) by mouth every evening., Disp: 90 tablet, Rfl: 3   megestrol  (MEGACE ) 400 MG/10ML suspension, Take 10 mLs (400 mg total) by mouth daily., Disp: 240 mL, Rfl: 1   MIEBO 1.338 GM/ML SOLN, , Disp: , Rfl:  pantoprazole  (PROTONIX ) 40 MG tablet, Take 1 tablet (40 mg total) by mouth daily., Disp: 30 tablet, Rfl: 2   promethazine  (PHENERGAN ) 12.5 MG tablet, Take 1 tablet (12.5 mg total) by mouth every 6 (six) hours as needed for nausea or vomiting., Disp: 30 tablet, Rfl: 0   RESTASIS 0.05 % ophthalmic emulsion, 1 drop 2 (two) times daily., Disp: , Rfl:    rosuvastatin  (CRESTOR ) 40 MG tablet, Take 1 tablet (40 mg total) by mouth daily., Disp: 90 tablet, Rfl: 3   Tiotropium Bromide -Olodaterol 2.5-2.5 MCG/ACT AERS, Inhale 2 puffs into the lungs daily., Disp: 4 g, Rfl: 5   traMADol  (ULTRAM ) 50 MG tablet, Take 1 tablet (50 mg total) by mouth every 6 (six)  hours as needed., Disp: 60 tablet, Rfl: 3  "

## 2024-12-12 ENCOUNTER — Other Ambulatory Visit: Payer: Self-pay

## 2024-12-12 DIAGNOSIS — R630 Anorexia: Secondary | ICD-10-CM

## 2024-12-12 NOTE — Telephone Encounter (Signed)
 Copied from CRM #[id number]. Topic: Clinical - Medication Refill >> Dec 12, 2024 12:18 PM Rosaria E wrote: Medication: megestrol  (MEGACE ) 400 MG/10ML suspension  Has the patient contacted their pharmacy? Yes (Agent: If no, request that the patient contact the pharmacy for the refill. If patient does not wish to contact the pharmacy document the reason why and proceed with request.) (Agent: If yes, when and what did the pharmacy advise?)  This is the patient's preferred pharmacy:  [id number] [place], [place] - [address] [place] [address] Phone: [phone] Fax: [phone]  Is this the correct pharmacy for this prescription? Yes If no, delete pharmacy and type the correct one.   Has the prescription been filled recently? Yes  Is the patient out of the medication? Yes  Has the patient been seen for an appointment in the last year OR does the patient have an upcoming appointment? Yes  Can we respond through MyChart? Yes  Agent: Please be advised that Rx refills may take up to 3 business days. We ask that you follow-up with your pharmacy.

## 2024-12-15 NOTE — Telephone Encounter (Signed)
 Requested medication (s) are due for refill today: Yes  Requested medication (s) are on the active medication list: Yes  Last refill:  10/31/24  Future visit scheduled: Yes  Notes to clinic:  Unable to refill per protocol, cannot delegate.      Requested Prescriptions  Pending Prescriptions Disp Refills   megestrol  (MEGACE ) 400 MG/10ML suspension 240 mL 1    Sig: Take 10 mLs (400 mg total) by mouth daily.     Not Delegated - OB/GYN:  Progestins - megestrol  acetate Failed - 12/15/2024  2:46 PM      Failed - This refill cannot be delegated      Failed - Last BP in normal range    BP Readings from Last 1 Encounters:  12/04/24 (!) 151/96         Passed - Valid encounter within last 12 months    Recent Outpatient Visits           1 month ago Appetite impaired   [facility] Lucas Fox, PA-C   3 months ago Chronic obstructive pulmonary disease, unspecified COPD type [facility])   [facility] Lucas Fox, PA-C   3 months ago Encounter to establish care with new provider   [facility] at [facility] Lucas Fox, PA-C

## 2024-12-16 ENCOUNTER — Telehealth: Payer: Self-pay | Admitting: Emergency Medicine

## 2024-12-16 MED ORDER — MEGESTROL ACETATE 400 MG/10ML PO SUSP: 400.0000 mg | Freq: Every day | ORAL | 1 refills | Status: AC

## 2024-12-16 NOTE — Telephone Encounter (Signed)
 Copied from CRM [id number]. Topic: Appointments - Appointment Scheduling >> Dec 16, 2024 12:29 PM Antony RAMAN wrote: For acute appointment the soonest is showing Jan 27th, please advise if that's the soonest or if she can be seen sooner. [phone]

## 2024-12-16 NOTE — Telephone Encounter (Signed)
 Called pt and left vm to call office back. Pt has been scheduled for an acute appt on the sooner appt opening on 01/15 with her pcp. Please advise on what the appt is for to update the appt notes

## 2024-12-18 ENCOUNTER — Ambulatory Visit
Admission: RE | Admit: 2024-12-18 | Discharge: 2024-12-18 | Disposition: A | Discharge status: Home / Self Care | Source: Ambulatory Visit

## 2024-12-18 ENCOUNTER — Ambulatory Visit (INDEPENDENT_AMBULATORY_CARE_PROVIDER_SITE_OTHER)

## 2024-12-18 VITALS — BP 149/90 | HR 102 | Temp 98.3°F | Resp 18 | Wt 91.5 lb

## 2024-12-18 DIAGNOSIS — R0602 Shortness of breath: Secondary | ICD-10-CM

## 2024-12-18 DIAGNOSIS — J441 Chronic obstructive pulmonary disease with (acute) exacerbation: Secondary | ICD-10-CM

## 2024-12-18 MED ORDER — METHYLPREDNISOLONE 4 MG PO TBPK: ORAL_TABLET | ORAL | 0 refills | Status: AC

## 2024-12-18 MED ORDER — IPRATROPIUM-ALBUTEROL 0.5-2.5 (3) MG/3ML IN SOLN
3.0000 mL | Freq: Once | RESPIRATORY_TRACT | Status: AC
  Administered 2024-12-18: 3 mL via RESPIRATORY_TRACT

## 2024-12-18 NOTE — ED Notes (Signed)
 Duo Neb complete.  Pt st's she feels much better

## 2024-12-18 NOTE — ED Triage Notes (Addendum)
 Asthmatic with sinus infection but needs to be tested for flu or upper respiratory infection - Entered by patient  Pt presents c/o wheezing and SOB x 7 days. Pt states,  I'm wheezing and SOB. I had a tele visit and they gave me meds for a sinus infection on the 25th but that aint helping. Everything is upper respiratory. Also, I wanna take a COVID test because my cousin was sick and said he had a sinus infection and I don't trust him.

## 2024-12-18 NOTE — ED Provider Notes (Signed)
 " [facility]    CSN: [id number] Arrival date & time: 12/18/24  9060      History   Chief Complaint Chief Complaint  Patient presents with   Wheezing    Asthmatic with sinus infection but needs to be tested for flu or upper respiratory infection - Entered by patient   Shortness of Breath    HPI Janice Williams is a 64 y.o. female.   Pt with a hx of COPD, presents today due to shortness of breath and wheezing for the past week. Pt states that she was seen by telehealth appt on 12/24 and was prescribed augmentin . Pt states that she has not experienced any relief. Pt states that she did find relief with use of albuterol  last night. Pt states that she is experiencing shortness of breath on exertion. Pt denies edema of legs or change in appetite.   The history is provided by the patient.  Wheezing Associated symptoms: shortness of breath   Shortness of Breath Associated symptoms: wheezing     Past Medical History:  Diagnosis Date   Anemia    Arthritis    generalized   Blood transfusion without reported diagnosis 2003   Bronchitis    Cataract    COPD (chronic obstructive pulmonary disease) (HCC)    COPD with acute exacerbation (HCC) 06/09/2017   Dental abscess 03/15/2015   DYSPEPSIA 02/14/2007   Qualifier: Diagnosis of  By: Manford Longs     EXTERNAL OTITIS 01/06/2010   Qualifier: Diagnosis of  By: Janna MD, Burn[place]     FACIAL RASH 02/04/2009   Qualifier: Diagnosis of  By: Cleotilde Pencil     Former smoker    quit 2014   GERD (gastroesophageal reflux disease)    Glaucoma    HIV infection (HCC)    Hypertension    Osteoporosis    Seasonal allergies    Stroke (HCC) 2003   Substance abuse (HCC)    history, clean 7 years   SVD (spontaneous vaginal delivery)    x 3    Patient Active Problem List   Diagnosis Date Noted   Appetite impaired 10/23/2024   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   Intractable headache 07/30/2024   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 07/30/2024   Pulmonary emphysema (HCC) 07/30/2024   Intractable nausea and vomiting 07/29/2024   Allergic rhinitis due to animal hair and dander 07/11/2024   Mild persistent asthma, uncomplicated 07/11/2024   Other chronic allergic conjunctivitis 07/11/2024   Other allergic rhinitis 07/11/2024   Vitamin D  deficiency 08/08/2023   Rash of hand 12/21/2022   Former cigarette smoker 11/16/2022   Sprain of calcaneofibular ligament 02/28/2022   Degenerative disc disease, cervical 10/13/2020   Osteoporosis 10/10/2019   Chronic obstructive pulmonary disease (HCC) 07/08/2019   Moderate protein-calorie malnutrition 05/31/2018   Hypertension 06/08/2017   Hyperlipidemia 06/03/2009   Human immunodeficiency virus (HIV) disease (HCC) 10/29/2008   GERD 10/29/2008   CEREBROVASCULAR ACCIDENT, HX OF 10/29/2008   Symptoms concerning nutrition, metabolism, and development 02/14/2007   Rhinitis, chronic 02/14/2007    Past Surgical History:  Procedure Laterality Date   COLONOSCOPY  2019   KN-MAC-suprep(adeq)-tics/hems/TA x 5   MULTIPLE TOOTH EXTRACTIONS     with sedation   POLYPECTOMY  01/2008   TA x 5   TONSILLECTOMY  1973   TUBAL LIGATION     UPPER GI ENDOSCOPY     normal per patient - years ago    OB History  Gravida  3   Para  3   Term  3   Preterm      AB      Living  3      SAB      IAB      Ectopic      Multiple      Live Births  3            Home Medications    Prior to Admission medications  Medication Sig Start Date End Date Taking? Authorizing Provider  amoxicillin -clavulanate (AUGMENTIN ) 875-125 MG tablet Take 1 tablet by mouth 2 (two) times daily. 12/10/24  Yes Vivienne Delon HERO, PA-C  HYDROcodone -acetaminophen  (NORCO/VICODIN) 5-325 MG tablet Take 1 tablet by mouth every 4 (four) hours as needed. 11/03/24  Yes [provider]  meloxicam  (MOBIC ) 15 MG tablet Take 15 mg by mouth daily. 11/17/24  Yes  [provider]  methylPREDNISolone  (MEDROL  DOSEPAK) 4 MG TBPK tablet Take as directed on back of package 12/18/24  Yes Andra Corean BROCKS, PA-C  albuterol  (VENTOLIN  HFA) 108 (90 Base) MCG/ACT inhaler Inhale 2 puffs into the lungs every 6 (six) hours as needed for wheezing or shortness of breath. 09/15/24   Leavy Rode, Sula, PA-C  amLODipine  (NORVASC ) 10 MG tablet Take 1 tablet (10 mg total) by mouth daily. 03/19/24 03/19/25  Marylu Gee, DO  bictegravir-emtricitabine -tenofovir  AF (BIKTARVY ) 50-200-25 MG TABS tablet Take 1 tablet by mouth daily. Additional refills pending 1/12 labs 12/01/24   Luiz Channel, MD  Ensure (ENSURE) Take 237 mLs by mouth 3 (three) times daily between meals. 10/22/24   Luiz Channel, MD  EPIPEN  2-PAK 0.3 MG/0.3ML SOAJ injection  03/21/22   [provider]  fluconazole  (DIFLUCAN ) 200 MG tablet Take 1 tablet (200 mg total) by mouth daily. 11/21/24   Luiz Channel, MD  fluticasone  (FLONASE ) 50 MCG/ACT nasal spray Place 2 sprays into both nostrils daily. 09/03/24   Tapia Cedeno, Jorge, PA-C  levocetirizine (XYZAL ) 5 MG tablet Take 1 tablet (5 mg total) by mouth every evening. 12/26/23   Luiz Channel, MD  megestrol  (MEGACE ) 400 MG/10ML suspension Take 10 mLs (400 mg total) by mouth daily. 12/16/24   Leavy Rode Sula, PA-C  MIEBO 1.338 GM/ML SOLN  02/25/24   [provider]  pantoprazole  (PROTONIX ) 40 MG tablet Take 1 tablet (40 mg total) by mouth daily. 10/31/24 11/30/24  Luiz Channel, MD  promethazine  (PHENERGAN ) 12.5 MG tablet Take 1 tablet (12.5 mg total) by mouth every 6 (six) hours as needed for nausea or vomiting. 08/13/24   Luiz Channel, MD  RESTASIS 0.05 % ophthalmic emulsion 1 drop 2 (two) times daily. 08/05/21   [provider]  rosuvastatin  (CRESTOR ) 40 MG tablet Take 1 tablet (40 mg total) by mouth daily. 12/27/23 12/26/24  Marylu Gee, DO  Tiotropium Bromide -Olodaterol 2.5-2.5 MCG/ACT AERS Inhale 2 puffs into the lungs  daily. 09/12/24   Leavy Rode Sula, PA-C  traMADol  (ULTRAM ) 50 MG tablet Take 1 tablet (50 mg total) by mouth every 6 (six) hours as needed. 12/04/24   Leavy Rode Sula, PA-C    Family History Family History  Problem Relation Age of Onset   Hypertension Mother    Hypertension Father    Hyperlipidemia Father    COPD Father    Atrial fibrillation Father    Colon cancer Neg Hx    Rectal cancer Neg Hx    Stomach cancer Neg Hx    Colon polyps Neg Hx    Esophageal cancer  Neg Hx    BRCA 1/2 Neg Hx    Breast cancer Neg Hx     Social History Social History[1]   Allergies   Varenicline    Review of Systems Review of Systems  Respiratory:  Positive for shortness of breath and wheezing.      Physical Exam Triage Vital Signs ED Triage Vitals  Encounter Vitals Group     BP 12/18/24 1017 (!) 149/90     Girls Systolic BP Percentile --      Girls Diastolic BP Percentile --      Boys Systolic BP Percentile --      Boys Diastolic BP Percentile --      Pulse Rate 12/18/24 1017 (!) 102     Resp 12/18/24 1017 18     Temp 12/18/24 1017 98.3 F (36.8 C)     Temp Source 12/18/24 1017 Oral     SpO2 12/18/24 1017 97 %     Weight 12/18/24 1016 91 lb 7.9 oz (41.5 kg)     Height --      Head Circumference --      Peak Flow --      Pain Score 12/18/24 1015 7     Pain Loc --      Pain Education --      Exclude from Growth Chart --    No data found.  Updated Vital Signs BP (!) 149/90 (BP Location: Left Arm)   Pulse (!) 102   Temp 98.3 F (36.8 C) (Oral)   Resp 18   Wt 91 lb 7.9 oz (41.5 kg)   SpO2 97%   BMI 16.21 kg/m   Visual Acuity Right Eye Distance:   Left Eye Distance:   Bilateral Distance:    Right Eye Near:   Left Eye Near:    Bilateral Near:     Physical Exam Vitals and nursing note reviewed.  Constitutional:      General: She is not in acute distress.    Appearance: Normal appearance. She is not ill-appearing, toxic-appearing or diaphoretic.  Eyes:      General: No scleral icterus. Cardiovascular:     Rate and Rhythm: Normal rate and regular rhythm.     Heart sounds: Normal heart sounds.  Pulmonary:     Effort: Pulmonary effort is normal.     Breath sounds: Decreased air movement present.  Skin:    General: Skin is warm.  Neurological:     Mental Status: She is alert and oriented to person, place, and time.  Psychiatric:        Mood and Affect: Mood normal.        Behavior: Behavior normal.      UC Treatments / Results  Labs (all labs ordered are listed, but only abnormal results are displayed) Labs Reviewed - No data to display  EKG   Radiology No results found.  Procedures Procedures (including critical care time)  Medications Ordered in UC Medications  ipratropium-albuterol  (DUONEB) 0.5-2.5 (3) MG/3ML nebulizer solution 3 mL (3 mLs Nebulization Given 12/18/24 1053)    Initial Impression / Assessment and Plan / UC Course  I have reviewed the triage vital signs and the nursing notes.  Pertinent labs & imaging results that were available during my care of the patient were reviewed by me and considered in my medical decision making (see chart for details).     Final Clinical Impressions(s) / UC Diagnoses   Final diagnoses:  Shortness of breath  COPD exacerbation (HCC)  Discharge Instructions      Take 2 puffs every 4 hrs  Be sure to take medrol  dosepack      ED Prescriptions     Medication Sig Dispense Auth. Provider   methylPREDNISolone  (MEDROL  DOSEPAK) 4 MG TBPK tablet Take as directed on back of package 21 tablet Andra Corean BROCKS, PA-C      PDMP not reviewed this encounter.     [1]  Social History Tobacco Use   Smoking status: Former    Current packs/day: 0.00    Average packs/day: 2.0 packs/day for 38.0 years (76.1 ttl pk-yrs)    Types: Cigarettes    Start date: 12/18/2012    Quit date: 11/21/2013    Years since quitting: 11.0    Passive exposure: Past   Smokeless tobacco:  Never  Vaping Use   Vaping status: Never Used  Substance Use Topics   Alcohol use: Not Currently   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     Andra Corean BROCKS, [place] 12/18/24 1118  "

## 2024-12-18 NOTE — Discharge Instructions (Addendum)
 Take 2 puffs every 4 hrs  Be sure to take medrol  dosepack   Will call if anything different shows on chest xray.

## 2024-12-19 ENCOUNTER — Telehealth: Payer: Self-pay

## 2024-12-19 NOTE — Telephone Encounter (Signed)
 Copied from CRM [id number]. Topic: Clinical - Medical Advice >> Dec 19, 2024  2:49 PM Janice Williams wrote: Reason for CRM: Patient was seen at [facility] for a COPD exacerbation. The patient has no current symptoms.She was prescribed methylprednisolone  (Medrol  Dosepak) 4 mg. Patient is asking whether she needs to avoid or limit any specific foods Please follow up with the patient at [phone]

## 2024-12-22 ENCOUNTER — Other Ambulatory Visit: Payer: Self-pay

## 2024-12-22 ENCOUNTER — Encounter: Payer: Self-pay | Admitting: Internal Medicine

## 2024-12-22 ENCOUNTER — Ambulatory Visit: Admitting: Internal Medicine

## 2024-12-22 VITALS — BP 128/83 | HR 99 | Temp 97.4°F | Wt 105.6 lb

## 2024-12-22 DIAGNOSIS — Z79899 Other long term (current) drug therapy: Secondary | ICD-10-CM

## 2024-12-22 DIAGNOSIS — J441 Chronic obstructive pulmonary disease with (acute) exacerbation: Secondary | ICD-10-CM | POA: Diagnosis not present

## 2024-12-22 DIAGNOSIS — J0100 Acute maxillary sinusitis, unspecified: Secondary | ICD-10-CM

## 2024-12-22 DIAGNOSIS — Z87891 Personal history of nicotine dependence: Secondary | ICD-10-CM | POA: Diagnosis not present

## 2024-12-22 DIAGNOSIS — B20 Human immunodeficiency virus [HIV] disease: Secondary | ICD-10-CM | POA: Diagnosis present

## 2024-12-22 DIAGNOSIS — R0981 Nasal congestion: Secondary | ICD-10-CM

## 2024-12-22 MED ORDER — FLUNISOLIDE 25 MCG/ACT (0.025%) NA SOLN: 2.0000 | Freq: Two times a day (BID) | NASAL | 0 refills | Status: AC | PRN

## 2024-12-22 NOTE — Telephone Encounter (Signed)
 I have no encounters with patient. Please forward patient's concern to patient's primary provider Sula Leavy Rode, PA.

## 2024-12-22 NOTE — Progress Notes (Signed)
 "     RFV: follow up for HIV disease Patient ID: Janice Williams, female   DOB: 01-27-1961, 64 y.o.   MRN: [id number]  HPI Barb is a 64yo F with well controlled HIV disease, currently on biktarvy  treated for respiratory illness, finished antibiotics and steroids. Has significant pain from sinusitis. Feels that left side of face is improved but right side of face/sinuses still tender.   Outpatient Encounter Medications as of 12/22/2024  Medication Sig   albuterol  (VENTOLIN  HFA) 108 (90 Base) MCG/ACT inhaler Inhale 2 puffs into the lungs every 6 (six) hours as needed for wheezing or shortness of breath.   amLODipine  (NORVASC ) 10 MG tablet Take 1 tablet (10 mg total) by mouth daily.   amoxicillin -clavulanate (AUGMENTIN ) 875-125 MG tablet Take 1 tablet by mouth 2 (two) times daily.   bictegravir-emtricitabine -tenofovir  AF (BIKTARVY ) 50-200-25 MG TABS tablet Take 1 tablet by mouth daily. Additional refills pending 1/12 labs   Ensure (ENSURE) Take 237 mLs by mouth 3 (three) times daily between meals.   EPIPEN  2-PAK 0.3 MG/0.3ML SOAJ injection    fluticasone  (FLONASE ) 50 MCG/ACT nasal spray Place 2 sprays into both nostrils daily.   HYDROcodone -acetaminophen  (NORCO/VICODIN) 5-325 MG tablet Take 1 tablet by mouth every 4 (four) hours as needed.   levocetirizine (XYZAL ) 5 MG tablet Take 1 tablet (5 mg total) by mouth every evening.   megestrol  (MEGACE ) 400 MG/10ML suspension Take 10 mLs (400 mg total) by mouth daily.   meloxicam  (MOBIC ) 15 MG tablet Take 15 mg by mouth daily.   methylPREDNISolone  (MEDROL  DOSEPAK) 4 MG TBPK tablet Take as directed on back of package   MIEBO 1.338 GM/ML SOLN    pantoprazole  (PROTONIX ) 40 MG tablet Take 1 tablet (40 mg total) by mouth daily.   promethazine  (PHENERGAN ) 12.5 MG tablet Take 1 tablet (12.5 mg total) by mouth every 6 (six) hours as needed for nausea or vomiting.   RESTASIS 0.05 % ophthalmic emulsion 1 drop 2 (two) times daily.   rosuvastatin  (CRESTOR ) 40 MG  tablet Take 1 tablet (40 mg total) by mouth daily.   Tiotropium Bromide -Olodaterol 2.5-2.5 MCG/ACT AERS Inhale 2 puffs into the lungs daily.   traMADol  (ULTRAM ) 50 MG tablet Take 1 tablet (50 mg total) by mouth every 6 (six) hours as needed.   fluconazole  (DIFLUCAN ) 200 MG tablet Take 1 tablet (200 mg total) by mouth daily.   No facility-administered encounter medications on file as of 12/22/2024.     Patient Active Problem List   Diagnosis Date Noted   Appetite impaired 10/23/2024   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   Intractable headache 07/30/2024   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   Pulmonary emphysema (HCC) 07/30/2024   Intractable nausea and vomiting 07/29/2024   Allergic rhinitis due to animal hair and dander 07/11/2024   Mild persistent asthma, uncomplicated 07/11/2024   Other chronic allergic conjunctivitis 07/11/2024   Other allergic rhinitis 07/11/2024   Vitamin D  deficiency 08/08/2023   Rash of hand 12/21/2022   Former cigarette smoker 11/16/2022   Sprain of calcaneofibular ligament 02/28/2022   Degenerative disc disease, cervical 10/13/2020   Osteoporosis 10/10/2019   Chronic obstructive pulmonary disease (HCC) 07/08/2019   Moderate protein-calorie malnutrition 05/31/2018   Hypertension 06/08/2017   Hyperlipidemia 06/03/2009   Human immunodeficiency virus (HIV) disease (HCC) 10/29/2008   GERD 10/29/2008   CEREBROVASCULAR ACCIDENT, HX OF 10/29/2008   Symptoms concerning nutrition, metabolism, and development 02/14/2007   Rhinitis, chronic 02/14/2007  Health Maintenance Due  Topic Date Due   Zoster Vaccines- Shingrix (1 of 2) Never done   Lung Cancer Screening  06/26/2024   COVID-19 Vaccine (6 - 2025-26 season) 08/18/2024     Review of Systems 12 point ros is + for what is mentioned in hpi Physical Exam   BP 128/83   Pulse 99   Temp (!) 97.4 F (36.3 C) (Oral)   Wt 105 lb 9.6 oz (47.9 kg)   SpO2 96%    BMI 18.71 kg/m   Physical Exam  Constitutional:  oriented to person, place, and time. appears well-developed and well-nourished. No distress.  HENT: [place]/AT, PERRLA, no scleral icterus Mouth/Throat: Oropharynx is clear and moist. No oropharyngeal exudate.  Cardiovascular: Normal rate, regular rhythm and normal heart sounds. Exam reveals no gallop and no friction rub.  No murmur heard.  Pulmonary/Chest: Effort normal and breath sounds normal. No respiratory distress.  has no wheezes.  Neck = supple, no nuchal rigidity Abdominal: Soft. Bowel sounds are normal.  exhibits no distension. There is no tenderness.  Lymphadenopathy: no cervical adenopathy. No axillary adenopathy Neurological: alert and oriented to person, place, and time.  Skin: Skin is warm and dry. No rash noted. No erythema.  Psychiatric: a normal mood and affect.  behavior is normal.   Lab Results  Component Value Date   CD4TCELL 33 07/30/2024   Lab Results  Component Value Date   CD4TABS 509 07/30/2024   CD4TABS 880 09/06/2023   CD4TABS 674 01/15/2023   Lab Results  Component Value Date   HIV1RNAQUANT 50 07/30/2024   Lab Results  Component Value Date   HEPBSAB NEG 12/28/2014   Lab Results  Component Value Date   LABRPR NON-REACTIVE 12/26/2023    CBC Lab Results  Component Value Date   WBC 4.6 07/30/2024   RBC 3.77 (L) 07/30/2024   HGB 12.3 07/30/2024   HCT 36.9 07/30/2024   PLT 236 07/30/2024   MCV 97.9 07/30/2024   MCH 32.6 07/30/2024   MCHC 33.3 07/30/2024   RDW 14.6 07/30/2024   LYMPHSABS 2,314 09/06/2023   MONOABS 0.6 06/28/2022   EOSABS 20 12/26/2023    BMET Lab Results  Component Value Date   NA 139 07/30/2024   K 3.6 07/30/2024   CL 101 07/30/2024   CO2 24 07/30/2024   GLUCOSE 73 07/30/2024   BUN 9 07/30/2024   CREATININE 0.96 07/30/2024   CALCIUM  9.7 07/30/2024   GFRNONAA >60 07/30/2024   GFRAA 65 03/07/2021      Assessment and Plan  Still symptomatic from respiratory  infection = Recommend to take mucinex  to help with symptoms. Has finished her course of amox/clav  COPD exacerbation = continue on steroids and inhaler  Hiv disease= will check labs to see that she is still well controlled  Nasal congestion = once recovered can do trial on nasalide   Health maintenance = possible missed documentation -> had covid vaccine she thinks in August   Long term medication management = will check cr  "

## 2024-12-23 LAB — T-HELPER CELL (CD4) - (RCID CLINIC ONLY)
CD4 % Helper T Cell: 44 % (ref 33–65)
CD4 T Cell Abs: 934 /uL (ref 400–1790)

## 2024-12-24 LAB — CBC WITH DIFFERENTIAL/PLATELET
Absolute Lymphocytes: 2355 {cells}/uL (ref 850–3900)
Absolute Monocytes: 765 {cells}/uL (ref 200–950)
Basophils Absolute: 9 {cells}/uL (ref 0–200)
Basophils Relative: 0.1 %
Eosinophils Absolute: 9 {cells}/uL — ABNORMAL LOW (ref 15–500)
Eosinophils Relative: 0.1 %
HCT: 39.5 % (ref 35.9–46.0)
Hemoglobin: 13.3 g/dL (ref 11.7–15.5)
MCH: 32 pg (ref 27.0–33.0)
MCHC: 33.7 g/dL (ref 31.6–35.4)
MCV: 95 fL (ref 81.4–101.7)
MPV: 9.2 fL (ref 7.5–12.5)
Monocytes Relative: 9 %
Neutro Abs: 5364 {cells}/uL (ref 1500–7800)
Neutrophils Relative %: 63.1 %
Platelets: 368 Thousand/uL (ref 140–400)
RBC: 4.16 Million/uL (ref 3.80–5.10)
RDW: 13.5 % (ref 11.0–15.0)
Total Lymphocyte: 27.7 %
WBC: 8.5 Thousand/uL (ref 3.8–10.8)

## 2024-12-24 LAB — COMPLETE METABOLIC PANEL WITHOUT GFR
AG Ratio: 1.2 (calc) (ref 1.0–2.5)
ALT: 7 U/L (ref 6–29)
AST: 21 U/L (ref 10–35)
Albumin: 4.5 g/dL (ref 3.6–5.1)
Alkaline phosphatase (APISO): 52 U/L (ref 37–153)
BUN/Creatinine Ratio: 28 (calc) — ABNORMAL HIGH (ref 6–22)
BUN: 29 mg/dL — ABNORMAL HIGH (ref 7–25)
CO2: 27 mmol/L (ref 20–32)
Calcium: 10.2 mg/dL (ref 8.6–10.4)
Chloride: 100 mmol/L (ref 98–110)
Creat: 1.04 mg/dL (ref 0.50–1.05)
Globulin: 3.9 g/dL — ABNORMAL HIGH (ref 1.9–3.7)
Glucose, Bld: 82 mg/dL (ref 65–99)
Potassium: 4 mmol/L (ref 3.5–5.3)
Sodium: 137 mmol/L (ref 135–146)
Total Bilirubin: 0.5 mg/dL (ref 0.2–1.2)
Total Protein: 8.4 g/dL — ABNORMAL HIGH (ref 6.1–8.1)

## 2024-12-24 LAB — SYPHILIS: RPR W/REFLEX TO RPR TITER AND TREPONEMAL ANTIBODIES, TRADITIONAL SCREENING AND DIAGNOSIS ALGORITHM: RPR Ser Ql: NONREACTIVE

## 2024-12-24 LAB — LIPID PANEL
Cholesterol: 208 mg/dL — ABNORMAL HIGH
HDL: 93 mg/dL
LDL Cholesterol (Calc): 99 mg/dL
Non-HDL Cholesterol (Calc): 115 mg/dL
Total CHOL/HDL Ratio: 2.2 (calc)
Triglycerides: 71 mg/dL

## 2024-12-24 LAB — HIV-1 RNA QUANT-NO REFLEX-BLD
HIV 1 RNA Quant: NOT DETECTED {copies}/mL
HIV-1 RNA Quant, Log: NOT DETECTED {Log_copies}/mL

## 2024-12-29 ENCOUNTER — Other Ambulatory Visit: Payer: Self-pay

## 2024-12-31 ENCOUNTER — Other Ambulatory Visit: Payer: Self-pay | Admitting: Internal Medicine

## 2024-12-31 ENCOUNTER — Other Ambulatory Visit: Payer: Self-pay

## 2024-12-31 DIAGNOSIS — B20 Human immunodeficiency virus [HIV] disease: Secondary | ICD-10-CM

## 2024-12-31 MED ORDER — BIKTARVY 50-200-25 MG PO TABS
1.0000 | ORAL_TABLET | Freq: Every day | ORAL | 5 refills | Status: AC
  Filled 2024-12-31: qty 30, 30d supply, fill #0

## 2025-01-01 ENCOUNTER — Ambulatory Visit (INDEPENDENT_AMBULATORY_CARE_PROVIDER_SITE_OTHER): Payer: Self-pay

## 2025-01-01 VITALS — BP 137/87 | HR 95 | Temp 98.2°F | Resp 16 | Wt 107.8 lb

## 2025-01-01 DIAGNOSIS — J3081 Allergic rhinitis due to animal (cat) (dog) hair and dander: Secondary | ICD-10-CM

## 2025-01-01 MED ORDER — LORATADINE 10 MG PO TABS: 10.0000 mg | ORAL_TABLET | Freq: Every day | ORAL | 3 refills | Status: AC

## 2025-01-01 MED ORDER — ROSUVASTATIN CALCIUM 20 MG PO TABS: 20.0000 mg | ORAL_TABLET | Freq: Every day | ORAL | 3 refills | Status: AC

## 2025-01-01 NOTE — Progress Notes (Unsigned)
" ° ° ° °  Patient ID: Janice Williams, female    DOB: [birth year]  MRN: [id number]  CC: Sinusitis (Patient has appt with ENT today/Patient said that she had a procedure (TMJ) done x1 month and have not been right since. Patient c/o ear popping, dry mouth, breathing is off, and ear inflammation /Patient was at [facility] and given medication but is not working according to patient )   Subjective: Janice Williams is a 64 y.o. female with past medical history of HIV, COPD who presents to clinic for follow. Pt reports recurrent thrush, sinusitis and metallic taste in her mouth since having TMJ surgery. Pt has been treated multiple times for thrush and sinusitis.   Allergies[1]  ROS: Review of Systems Negative except as stated above  PHYSICAL EXAM: BP 137/87   Pulse 95   Temp 98.2 F (36.8 C) (Oral)   Resp 16   Wt 107 lb 12.8 oz (48.9 kg)   SpO2 98%   BMI 19.10 kg/m   Physical Exam  General: well-appearing, no acute distress Skin: no jaundice, rashes, or lesions ENT: no white patches on tongue Cardiovascular: regular heart rate and rhythm, normal S1/S2, no murmurs, gallops, or rubs, peripheral pulses 2+ bilaterally Chest: no skeletal deformity, lungs clear to auscultation bilaterally, equal breath sounds bilaterally Musculoskeletal: normal gait Extremities: no peripheral edema  ASSESSMENT AND PLAN:  1. Allergic rhinitis due to animal hair and dander (Primary) - Advised to follow up with ENT for recurrent sinusitis. Patient has follow up appointment today.  - Current upper respiratory infection not suspected - Advised change of allergy medication since patient is not experiencing relief from xyzal .  - Start loratadine  (CLARITIN ) 10 MG tablet; Take 1 tablet (10 mg total) by mouth daily.  Dispense: 90 tablet; Refill: 3   Patient was given the opportunity to ask questions.  Patient verbalized understanding of the plan and was able to repeat key elements of the plan.    No orders of the defined  types were placed in this encounter.    Requested Prescriptions   Signed Prescriptions Disp Refills   loratadine  (CLARITIN ) 10 MG tablet 90 tablet 3    Sig: Take 1 tablet (10 mg total) by mouth daily.   rosuvastatin  (CRESTOR ) 20 MG tablet 90 tablet 3    Sig: Take 1 tablet (20 mg total) by mouth daily.    Return in about 3 months (around 04/01/2025) for follow-up.  Sula Cower Cricket Goodlin, PA-C      [1]  Allergies Allergen Reactions   Varenicline  Itching, Other (See Comments) and Dermatitis   "

## 2025-01-02 ENCOUNTER — Other Ambulatory Visit: Payer: Self-pay

## 2025-01-02 ENCOUNTER — Other Ambulatory Visit: Payer: Self-pay | Admitting: Pharmacy Technician

## 2025-01-02 NOTE — Progress Notes (Signed)
 Specialty Pharmacy Refill Coordination Note  Janice Williams is a 64 y.o. female contacted today regarding refills of specialty medication(s) Bictegravir-Emtricitab-Tenofov (Biktarvy )   Patient requested Delivery   Delivery date: 01/07/25   Verified address: [address]  [place]   Medication will be filled on: 01/06/25

## 2025-01-04 ENCOUNTER — Other Ambulatory Visit: Payer: Self-pay

## 2025-01-06 ENCOUNTER — Other Ambulatory Visit: Payer: Self-pay

## 2025-01-13 ENCOUNTER — Other Ambulatory Visit: Payer: Self-pay | Admitting: Internal Medicine

## 2025-01-13 NOTE — Telephone Encounter (Signed)
 Patient has PCP for further management.   Janice Williams, BSN, RN

## 2025-01-14 ENCOUNTER — Other Ambulatory Visit: Payer: Self-pay

## 2025-01-14 NOTE — Telephone Encounter (Unsigned)
 Copied from CRM #[id number]. Topic: Clinical - Medication Refill >> Jan 14, 2025 11:10 AM Vena HERO wrote: Medication: flunisolide  (NASALIDE ) 25 MCG/ACT (0.025%) SOLN  Has the patient contacted their pharmacy? No (Agent: If no, request that the patient contact the pharmacy for the refill. If patient does not wish to contact the pharmacy document the reason why and proceed with request.) (Agent: If yes, when and what did the pharmacy advise?)  This is the patient's preferred pharmacy:  [id number] [place],  - [address] [place] [address] Phone: [phone] Fax: [phone]  Is this the correct pharmacy for this prescription? Yes If no, delete pharmacy and type the correct one.   Has the prescription been filled recently? No  Is the patient out of the medication? Yes  Has the patient been seen for an appointment in the last year OR does the patient have an upcoming appointment? Yes  Can we respond through MyChart? Yes  Agent: Please be advised that Rx refills may take up to 3 business days. We ask that you follow-up with your pharmacy.

## 2025-01-15 NOTE — Telephone Encounter (Signed)
 Requested medication (s) are due for refill today: yes  Requested medication (s) are on the active medication list: yes  Last refill:  12/22/24  Future visit scheduled: yes  Notes to clinic:  Unable to refill per protocol, last refill by another provider.      Requested Prescriptions  Pending Prescriptions Disp Refills   flunisolide  (NASALIDE ) 25 MCG/ACT (0.025%) SOLN 25 mL 0    Sig: Place 2 sprays into the nose 2 (two) times daily as needed.     Ear, Nose, and Throat: Nasal Preparations - Corticosteroids Passed - 01/15/2025  1:21 PM      Passed - Valid encounter within last 12 months    Recent Outpatient Visits           2 weeks ago Allergic rhinitis due to animal hair and dander   [facility] Leavy Lucas Fox, PA-C   2 months ago Appetite impaired   [facility] at [facility] Leavy Lucas Fox, PA-C   4 months ago Chronic obstructive pulmonary disease, unspecified COPD type [facility]   [place] [facility] Leavy Lucas Fox, PA-C   4 months ago Encounter to establish care with new provider   [facility] at [facility] Leavy Lucas Fox, PA-C       Future Appointments             In 1 month Luiz Channel, MD [facility] Of [place]. [facility], [place]

## 2025-01-22 ENCOUNTER — Ambulatory Visit: Payer: Self-pay

## 2025-02-18 ENCOUNTER — Ambulatory Visit: Payer: Self-pay | Admitting: Internal Medicine

## 2025-04-01 ENCOUNTER — Ambulatory Visit: Payer: Self-pay
